# Patient Record
Sex: Male | Born: 1935 | Race: White | Hispanic: No | Marital: Married | Smoking: Former smoker
Health system: Southern US, Community
[De-identification: ages and names within clinical notes are randomized; demographics above are authoritative.]

## PROBLEM LIST (undated history)

## (undated) DIAGNOSIS — Q211 Atrial septal defect, unspecified: Secondary | ICD-10-CM

## (undated) DIAGNOSIS — R1310 Dysphagia, unspecified: Secondary | ICD-10-CM

## (undated) DIAGNOSIS — H9190 Unspecified hearing loss, unspecified ear: Secondary | ICD-10-CM

## (undated) DIAGNOSIS — F329 Major depressive disorder, single episode, unspecified: Secondary | ICD-10-CM

## (undated) DIAGNOSIS — J449 Chronic obstructive pulmonary disease, unspecified: Secondary | ICD-10-CM

## (undated) DIAGNOSIS — Z8719 Personal history of other diseases of the digestive system: Secondary | ICD-10-CM

## (undated) DIAGNOSIS — I4821 Permanent atrial fibrillation: Secondary | ICD-10-CM

## (undated) DIAGNOSIS — I2510 Atherosclerotic heart disease of native coronary artery without angina pectoris: Secondary | ICD-10-CM

## (undated) DIAGNOSIS — I519 Heart disease, unspecified: Secondary | ICD-10-CM

## (undated) DIAGNOSIS — IMO0001 Reserved for inherently not codable concepts without codable children: Secondary | ICD-10-CM

## (undated) DIAGNOSIS — K648 Other hemorrhoids: Secondary | ICD-10-CM

## (undated) DIAGNOSIS — H547 Unspecified visual loss: Secondary | ICD-10-CM

## (undated) DIAGNOSIS — K449 Diaphragmatic hernia without obstruction or gangrene: Secondary | ICD-10-CM

## (undated) DIAGNOSIS — N400 Enlarged prostate without lower urinary tract symptoms: Secondary | ICD-10-CM

## (undated) DIAGNOSIS — I639 Cerebral infarction, unspecified: Secondary | ICD-10-CM

## (undated) DIAGNOSIS — J42 Unspecified chronic bronchitis: Secondary | ICD-10-CM

## (undated) DIAGNOSIS — F32A Depression, unspecified: Secondary | ICD-10-CM

## (undated) DIAGNOSIS — K317 Polyp of stomach and duodenum: Secondary | ICD-10-CM

## (undated) DIAGNOSIS — D649 Anemia, unspecified: Secondary | ICD-10-CM

## (undated) DIAGNOSIS — K219 Gastro-esophageal reflux disease without esophagitis: Secondary | ICD-10-CM

## (undated) DIAGNOSIS — I10 Essential (primary) hypertension: Secondary | ICD-10-CM

## (undated) DIAGNOSIS — E785 Hyperlipidemia, unspecified: Secondary | ICD-10-CM

## (undated) DIAGNOSIS — K2960 Other gastritis without bleeding: Secondary | ICD-10-CM

## (undated) DIAGNOSIS — R296 Repeated falls: Secondary | ICD-10-CM

## (undated) DIAGNOSIS — K529 Noninfective gastroenteritis and colitis, unspecified: Secondary | ICD-10-CM

## (undated) HISTORY — DX: Polyp of stomach and duodenum: K31.7

## (undated) HISTORY — PX: CHOLECYSTECTOMY: SHX55

## (undated) HISTORY — DX: Diaphragmatic hernia without obstruction or gangrene: K44.9

## (undated) HISTORY — DX: Essential (primary) hypertension: I10

## (undated) HISTORY — PX: CARDIOVERSION: SHX1299

## (undated) HISTORY — DX: Cerebral infarction, unspecified: I63.9

## (undated) HISTORY — DX: Atherosclerotic heart disease of native coronary artery without angina pectoris: I25.10

## (undated) HISTORY — DX: Other gastritis without bleeding: K29.60

## (undated) HISTORY — DX: Gastro-esophageal reflux disease without esophagitis: K21.9

## (undated) HISTORY — DX: Other hemorrhoids: K64.8

## (undated) HISTORY — DX: Personal history of other diseases of the digestive system: Z87.19

## (undated) HISTORY — DX: Permanent atrial fibrillation: I48.21

## (undated) HISTORY — DX: Dysphagia, unspecified: R13.10

---

## 1961-06-03 HISTORY — PX: INGUINAL HERNIA REPAIR: SUR1180

## 1981-06-03 HISTORY — PX: ASD REPAIR: SHX258

## 1988-06-03 DIAGNOSIS — I639 Cerebral infarction, unspecified: Secondary | ICD-10-CM

## 1988-06-03 HISTORY — DX: Cerebral infarction, unspecified: I63.9

## 1992-09-22 DIAGNOSIS — K259 Gastric ulcer, unspecified as acute or chronic, without hemorrhage or perforation: Secondary | ICD-10-CM | POA: Insufficient documentation

## 1992-09-25 DIAGNOSIS — D126 Benign neoplasm of colon, unspecified: Secondary | ICD-10-CM

## 1999-01-23 ENCOUNTER — Encounter: Payer: Self-pay | Admitting: General Surgery

## 1999-01-26 ENCOUNTER — Observation Stay (HOSPITAL_COMMUNITY): Admission: RE | Admit: 1999-01-26 | Discharge: 1999-01-27 | Payer: Self-pay | Admitting: General Surgery

## 2000-01-01 ENCOUNTER — Other Ambulatory Visit: Admission: RE | Admit: 2000-01-01 | Discharge: 2000-01-01 | Payer: Self-pay | Admitting: Urology

## 2000-01-01 ENCOUNTER — Encounter (INDEPENDENT_AMBULATORY_CARE_PROVIDER_SITE_OTHER): Payer: Self-pay

## 2000-01-02 ENCOUNTER — Emergency Department (HOSPITAL_COMMUNITY): Admission: EM | Admit: 2000-01-02 | Discharge: 2000-01-02 | Payer: Self-pay | Admitting: Emergency Medicine

## 2000-05-12 ENCOUNTER — Inpatient Hospital Stay (HOSPITAL_COMMUNITY): Admission: EM | Admit: 2000-05-12 | Discharge: 2000-05-12 | Payer: Self-pay | Admitting: Cardiology

## 2001-12-03 ENCOUNTER — Encounter: Payer: Self-pay | Admitting: Internal Medicine

## 2001-12-03 DIAGNOSIS — K449 Diaphragmatic hernia without obstruction or gangrene: Secondary | ICD-10-CM

## 2001-12-03 DIAGNOSIS — D131 Benign neoplasm of stomach: Secondary | ICD-10-CM

## 2002-04-22 ENCOUNTER — Other Ambulatory Visit: Admission: RE | Admit: 2002-04-22 | Discharge: 2002-04-22 | Payer: Self-pay | Admitting: Dermatology

## 2002-05-24 ENCOUNTER — Encounter: Payer: Self-pay | Admitting: Otolaryngology

## 2002-05-24 ENCOUNTER — Ambulatory Visit (HOSPITAL_COMMUNITY): Admission: RE | Admit: 2002-05-24 | Discharge: 2002-05-24 | Payer: Self-pay | Admitting: Otolaryngology

## 2002-11-22 ENCOUNTER — Encounter: Payer: Self-pay | Admitting: Family Medicine

## 2002-11-22 ENCOUNTER — Ambulatory Visit (HOSPITAL_COMMUNITY): Admission: RE | Admit: 2002-11-22 | Discharge: 2002-11-22 | Payer: Self-pay | Admitting: Family Medicine

## 2003-02-28 ENCOUNTER — Encounter: Admission: RE | Admit: 2003-02-28 | Discharge: 2003-05-29 | Payer: Self-pay | Admitting: Family Medicine

## 2003-06-04 HISTORY — PX: OTHER SURGICAL HISTORY: SHX169

## 2003-06-10 ENCOUNTER — Encounter: Payer: Self-pay | Admitting: Internal Medicine

## 2003-06-10 DIAGNOSIS — K648 Other hemorrhoids: Secondary | ICD-10-CM | POA: Insufficient documentation

## 2004-06-19 ENCOUNTER — Ambulatory Visit: Payer: Self-pay | Admitting: Cardiology

## 2004-07-02 ENCOUNTER — Ambulatory Visit: Payer: Self-pay

## 2004-07-02 ENCOUNTER — Ambulatory Visit: Payer: Self-pay | Admitting: Internal Medicine

## 2005-07-03 ENCOUNTER — Ambulatory Visit: Payer: Self-pay | Admitting: Cardiology

## 2007-01-19 ENCOUNTER — Ambulatory Visit: Payer: Self-pay | Admitting: Internal Medicine

## 2007-01-19 LAB — CONVERTED CEMR LAB
Eosinophils Absolute: 0.2 10*3/uL (ref 0.0–0.6)
Eosinophils Relative: 3.7 % (ref 0.0–5.0)
Hemoglobin: 13 g/dL (ref 13.0–17.0)
Lymphocytes Relative: 23.4 % (ref 12.0–46.0)
MCV: 101.1 fL — ABNORMAL HIGH (ref 78.0–100.0)
Monocytes Absolute: 0.4 10*3/uL (ref 0.2–0.7)
Monocytes Relative: 10 % (ref 3.0–11.0)
Neutro Abs: 2.7 10*3/uL (ref 1.4–7.7)
Platelets: 170 10*3/uL (ref 150–400)

## 2007-06-26 ENCOUNTER — Ambulatory Visit: Payer: Self-pay | Admitting: Cardiovascular Disease

## 2007-06-26 ENCOUNTER — Inpatient Hospital Stay (HOSPITAL_COMMUNITY): Admission: EM | Admit: 2007-06-26 | Discharge: 2007-06-30 | Payer: Self-pay | Admitting: Emergency Medicine

## 2007-06-29 ENCOUNTER — Encounter: Payer: Self-pay | Admitting: Cardiovascular Disease

## 2007-07-21 ENCOUNTER — Ambulatory Visit: Payer: Self-pay | Admitting: Cardiology

## 2007-09-05 DIAGNOSIS — I10 Essential (primary) hypertension: Secondary | ICD-10-CM

## 2007-09-05 DIAGNOSIS — K209 Esophagitis, unspecified without bleeding: Secondary | ICD-10-CM | POA: Insufficient documentation

## 2007-09-05 DIAGNOSIS — K219 Gastro-esophageal reflux disease without esophagitis: Secondary | ICD-10-CM

## 2007-09-05 DIAGNOSIS — R1310 Dysphagia, unspecified: Secondary | ICD-10-CM | POA: Insufficient documentation

## 2007-09-05 DIAGNOSIS — I4891 Unspecified atrial fibrillation: Secondary | ICD-10-CM | POA: Insufficient documentation

## 2007-09-05 DIAGNOSIS — K922 Gastrointestinal hemorrhage, unspecified: Secondary | ICD-10-CM | POA: Insufficient documentation

## 2007-09-05 DIAGNOSIS — I2510 Atherosclerotic heart disease of native coronary artery without angina pectoris: Secondary | ICD-10-CM

## 2007-09-05 DIAGNOSIS — K2960 Other gastritis without bleeding: Secondary | ICD-10-CM

## 2007-09-05 DIAGNOSIS — I6350 Cerebral infarction due to unspecified occlusion or stenosis of unspecified cerebral artery: Secondary | ICD-10-CM | POA: Insufficient documentation

## 2008-01-27 ENCOUNTER — Ambulatory Visit: Payer: Self-pay | Admitting: Cardiology

## 2008-11-10 ENCOUNTER — Telehealth (INDEPENDENT_AMBULATORY_CARE_PROVIDER_SITE_OTHER): Payer: Self-pay | Admitting: *Deleted

## 2008-12-19 ENCOUNTER — Ambulatory Visit: Payer: Self-pay | Admitting: Cardiology

## 2008-12-26 ENCOUNTER — Encounter: Payer: Self-pay | Admitting: Family Medicine

## 2008-12-26 ENCOUNTER — Ambulatory Visit (HOSPITAL_COMMUNITY): Admission: RE | Admit: 2008-12-26 | Discharge: 2008-12-26 | Payer: Self-pay | Admitting: Family Medicine

## 2008-12-26 ENCOUNTER — Ambulatory Visit: Payer: Self-pay | Admitting: Vascular Surgery

## 2009-01-01 ENCOUNTER — Emergency Department (HOSPITAL_COMMUNITY): Admission: EM | Admit: 2009-01-01 | Discharge: 2009-01-01 | Payer: Self-pay | Admitting: Emergency Medicine

## 2009-01-01 ENCOUNTER — Encounter (INDEPENDENT_AMBULATORY_CARE_PROVIDER_SITE_OTHER): Payer: Self-pay | Admitting: Emergency Medicine

## 2009-03-14 ENCOUNTER — Ambulatory Visit: Payer: Self-pay

## 2009-03-14 ENCOUNTER — Ambulatory Visit: Payer: Self-pay | Admitting: Cardiology

## 2009-03-14 ENCOUNTER — Ambulatory Visit (HOSPITAL_COMMUNITY): Admission: RE | Admit: 2009-03-14 | Discharge: 2009-03-14 | Payer: Self-pay | Admitting: Cardiology

## 2009-03-20 ENCOUNTER — Ambulatory Visit: Payer: Self-pay | Admitting: Cardiology

## 2009-03-20 ENCOUNTER — Telehealth (INDEPENDENT_AMBULATORY_CARE_PROVIDER_SITE_OTHER): Payer: Self-pay | Admitting: *Deleted

## 2009-03-21 ENCOUNTER — Encounter (INDEPENDENT_AMBULATORY_CARE_PROVIDER_SITE_OTHER): Payer: Self-pay | Admitting: *Deleted

## 2009-03-22 ENCOUNTER — Ambulatory Visit: Payer: Self-pay | Admitting: Internal Medicine

## 2009-03-23 LAB — CONVERTED CEMR LAB
BUN: 10 mg/dL (ref 6–23)
Calcium: 8.5 mg/dL (ref 8.4–10.5)
GFR calc non Af Amer: 87.49 mL/min (ref >60)
Glucose, Bld: 102 mg/dL — ABNORMAL HIGH (ref 70–99)
Potassium: 3.9 meq/L (ref 3.5–5.1)

## 2009-03-28 ENCOUNTER — Ambulatory Visit: Payer: Self-pay | Admitting: Cardiology

## 2009-04-06 ENCOUNTER — Ambulatory Visit: Payer: Self-pay | Admitting: Cardiology

## 2009-04-06 ENCOUNTER — Encounter: Payer: Self-pay | Admitting: Cardiology

## 2009-07-20 ENCOUNTER — Ambulatory Visit: Payer: Self-pay | Admitting: Family Medicine

## 2009-07-20 DIAGNOSIS — E785 Hyperlipidemia, unspecified: Secondary | ICD-10-CM | POA: Insufficient documentation

## 2009-07-20 DIAGNOSIS — G609 Hereditary and idiopathic neuropathy, unspecified: Secondary | ICD-10-CM

## 2009-07-20 DIAGNOSIS — N400 Enlarged prostate without lower urinary tract symptoms: Secondary | ICD-10-CM | POA: Insufficient documentation

## 2009-07-20 LAB — CONVERTED CEMR LAB: Cholesterol, target level: 200 mg/dL

## 2009-07-26 ENCOUNTER — Ambulatory Visit: Payer: Self-pay | Admitting: Family Medicine

## 2009-07-26 DIAGNOSIS — E538 Deficiency of other specified B group vitamins: Secondary | ICD-10-CM | POA: Insufficient documentation

## 2009-07-26 DIAGNOSIS — R739 Hyperglycemia, unspecified: Secondary | ICD-10-CM

## 2009-07-26 LAB — CONVERTED CEMR LAB
AST: 31 units/L (ref 0–37)
Albumin: 4 g/dL (ref 3.5–5.2)
Alkaline Phosphatase: 46 units/L (ref 39–117)
BUN: 10 mg/dL (ref 6–23)
Bilirubin, Direct: 0.2 mg/dL (ref 0.0–0.3)
CO2: 32 meq/L (ref 19–32)
Calcium: 8.9 mg/dL (ref 8.4–10.5)
Chloride: 108 meq/L (ref 96–112)
Cholesterol: 124 mg/dL (ref 0–200)
Creatinine, Ser: 0.9 mg/dL (ref 0.4–1.5)
Direct LDL: 54.4 mg/dL
Glucose, Bld: 169 mg/dL — ABNORMAL HIGH (ref 70–99)
Total Bilirubin: 0.6 mg/dL (ref 0.3–1.2)
Total CHOL/HDL Ratio: 3
VLDL: 44.2 mg/dL — ABNORMAL HIGH (ref 0.0–40.0)
Vitamin B-12: 125 pg/mL — ABNORMAL LOW (ref 211–911)

## 2009-08-08 ENCOUNTER — Ambulatory Visit: Payer: Self-pay | Admitting: Family Medicine

## 2009-08-08 LAB — CONVERTED CEMR LAB: Blood Glucose, Fingerstick: 89

## 2009-08-22 ENCOUNTER — Ambulatory Visit: Payer: Self-pay | Admitting: Family Medicine

## 2009-09-06 ENCOUNTER — Telehealth: Payer: Self-pay | Admitting: Family Medicine

## 2009-09-06 ENCOUNTER — Ambulatory Visit: Payer: Self-pay | Admitting: Family Medicine

## 2009-09-19 ENCOUNTER — Ambulatory Visit: Payer: Self-pay | Admitting: Family Medicine

## 2009-10-03 ENCOUNTER — Ambulatory Visit: Payer: Self-pay | Admitting: Family Medicine

## 2009-10-11 ENCOUNTER — Telehealth: Payer: Self-pay | Admitting: *Deleted

## 2009-10-17 ENCOUNTER — Ambulatory Visit: Payer: Self-pay | Admitting: Family Medicine

## 2009-11-01 ENCOUNTER — Ambulatory Visit: Payer: Self-pay | Admitting: Family Medicine

## 2009-11-09 ENCOUNTER — Ambulatory Visit: Payer: Self-pay | Admitting: Family Medicine

## 2009-11-09 DIAGNOSIS — R5381 Other malaise: Secondary | ICD-10-CM

## 2009-11-09 DIAGNOSIS — R5383 Other fatigue: Secondary | ICD-10-CM

## 2009-11-10 LAB — CONVERTED CEMR LAB
Basophils Relative: 0.5 % (ref 0.0–3.0)
Eosinophils Absolute: 0.1 10*3/uL (ref 0.0–0.7)
Hemoglobin: 13.7 g/dL (ref 13.0–17.0)
Lymphocytes Relative: 19.4 % (ref 12.0–46.0)
MCHC: 34.1 g/dL (ref 30.0–36.0)
Monocytes Relative: 10.2 % (ref 3.0–12.0)
Neutro Abs: 3.1 10*3/uL (ref 1.4–7.7)
Neutrophils Relative %: 68.4 % (ref 43.0–77.0)
RBC: 4.39 M/uL (ref 4.22–5.81)
Vitamin B-12: 480 pg/mL (ref 211–911)
WBC: 4.5 10*3/uL (ref 4.5–10.5)

## 2010-03-19 ENCOUNTER — Ambulatory Visit: Payer: Self-pay | Admitting: Cardiology

## 2010-03-19 DIAGNOSIS — R143 Flatulence: Secondary | ICD-10-CM

## 2010-03-19 DIAGNOSIS — R0602 Shortness of breath: Secondary | ICD-10-CM | POA: Insufficient documentation

## 2010-03-19 DIAGNOSIS — R141 Gas pain: Secondary | ICD-10-CM

## 2010-03-19 DIAGNOSIS — R142 Eructation: Secondary | ICD-10-CM

## 2010-04-03 ENCOUNTER — Ambulatory Visit (HOSPITAL_COMMUNITY)
Admission: RE | Admit: 2010-04-03 | Discharge: 2010-04-03 | Payer: Self-pay | Source: Home / Self Care | Admitting: Cardiology

## 2010-04-06 ENCOUNTER — Telehealth: Payer: Self-pay | Admitting: Cardiology

## 2010-04-25 ENCOUNTER — Encounter: Payer: Self-pay | Admitting: Family Medicine

## 2010-07-01 LAB — CONVERTED CEMR LAB
Basophils Relative: 0.5 % (ref 0.0–3.0)
Ferritin: 34.7 ng/mL (ref 22.0–322.0)
HCT: 41.3 % (ref 39.0–52.0)
Hemoglobin: 14 g/dL (ref 13.0–17.0)
Lymphocytes Relative: 21 % (ref 12.0–46.0)
Lymphs Abs: 1 10*3/uL (ref 0.7–4.0)
MCHC: 33.8 g/dL (ref 30.0–36.0)
Monocytes Relative: 9.7 % (ref 3.0–12.0)
Neutro Abs: 3.3 10*3/uL (ref 1.4–7.7)
RBC: 4.48 M/uL (ref 4.22–5.81)
Saturation Ratios: 22.7 % (ref 20.0–50.0)

## 2010-07-05 NOTE — Progress Notes (Signed)
Summary: pt returned call   Phone Note Call from Patient   Caller: Patient [phone] Reason for Call: Talk to Nurse Summary of Call: pt returned call Initial call taken by: Glynda Jaeger,  April 06, 2010 1:50 PM  Follow-up for Phone Call        I spoke with the pt. Follow-up by: Sherri Rad, RN, BSN,  April 06, 2010 2:32 PM

## 2010-07-05 NOTE — Assessment & Plan Note (Signed)
Summary: b12 inj also fasting blood glucose/njr   Nurse Visit  CBG Result 89   Impression & Recommendations:  Problem # 1:  HYPERGLYCEMIA (ICD-790.29) fasting glucose normal.  Complete Medication List: 1)  Omeprazole 20 Mg Cpdr (Omeprazole) .... 2 caps daily 2)  Enalapril Maleate 10 Mg Tabs (Enalapril maleate) .... Take one tablet by mouth twice a day 3)  Hydrochlorothiazide 25 Mg Tabs (Hydrochlorothiazide) .... Once daily 4)  Warfarin Sodium 5 Mg Tabs (Warfarin sodium) .... Take as directed 5)  Doxazosin Mesylate 4 Mg Tabs (Doxazosin mesylate) .... Once daily 6)  Zocor 20 Mg Tabs (Simvastatin) .... Once daily 7)  Norvasc 5 Mg Tabs (Amlodipine besylate) .[name].. 1 once daily 8)  Gabapentin 300 Mg Caps (Gabapentin) .... Take one tab two times a day 9)  Tandem Plus 162-115.2-1 Mg Caps (Fefum-fepo-fa-b cmp-c-zn-mn-cu) .[name].. 1 tab once daily  Other Orders: Capillary Blood Glucose/CBG (16109) Vit B12 1000 mcg (J3420) Admin of Therapeutic Inj  intramuscular or subcutaneous (60454)   Allergies: 1)  ! Pcn Laboratory Results   Blood Tests     CBG Random:: 89mg /dL     Medication Administration  Injection # 1:    Medication: Vit B12 1000 mcg    Diagnosis: B12 DEFICIENCY (ICD-266.2)    Route: IM    Site: R deltoid    Exp Date: 02/02/2011    Lot #: [id number]    Mfr: American Regent    Patient tolerated injection without complications    Given by: Sid Falcon LPN (August 08, 1912 8:48 AM)  Orders Added: 1)  Capillary Blood Glucose/CBG [82948] 2)  Vit B12 1000 mcg [J3420] 3)  Admin of Therapeutic Inj  intramuscular or subcutaneous [78295]

## 2010-07-05 NOTE — Assessment & Plan Note (Signed)
Summary: b12//ccm   Nurse Visit   Allergies: 1)  ! Pcn  Medication Administration  Injection # 1:    Medication: Vit B12 1000 mcg    Diagnosis: B12 DEFICIENCY (ICD-266.2)    Route: IM    Site: R deltoid    Exp Date: 02/02/2011    Lot #: [id number]    Mfr: American Regent    Patient tolerated injection without complications    Given by: Sid Falcon LPN (September 06, 4164 12:21 PM)  Orders Added: 1)  Vit B12 1000 mcg [J3420] 2)  Admin of Therapeutic Inj  intramuscular or subcutaneous [06301]

## 2010-07-05 NOTE — Assessment & Plan Note (Signed)
Summary: b12 inj/njr   Nurse Visit   Allergies: 1)  ! Pcn  Medication Administration  Injection # 1:    Medication: Vit B12 1000 mcg    Diagnosis: B12 DEFICIENCY (ICD-266.2)    Route: IM    Site: L deltoid    Exp Date: 02/02/2011    Lot #: [id number]    Mfr: American Regent    Patient tolerated injection without complications    Given by: Sid Falcon LPN (August 22, 2009 12:44 PM)  Orders Added: 1)  Vit B12 1000 mcg [J3420] 2)  Admin of Therapeutic Inj  intramuscular or subcutaneous [25366]

## 2010-07-05 NOTE — Assessment & Plan Note (Signed)
Summary: fup labs//ccm/wife rescd per pt/ccm   Vital Signs:  Patient profile:   77 year old male Temp:     97.8 degrees F oral BP sitting:   140 / 70  (left arm) Cuff size:   regular  Vitals Entered By: Sid Falcon LPN (July 26, 2009 1:51 PM) CC: follow-up visit to discuss labs   History of Present Illness: Patient here for followup regarding recent abnormal labs.  Lab work significant for low B12 of 125. Patient had complained of some short-term memory loss and also fatigue issues and bilateral numbness in feet and lower legs. No history of diabetes but nonfasting blood sugar was 169. Takes multivitamin which presumably has some B12. No known history of B12 deficiency.  Increased fatigue but denies any thirst or weight changes.  Has some chronic urine frequency.  No hx of DM.  Chronic Coumadin therapy related to atrial fibrillation. Recent INR 1.5. History of upper GI bleeds and apparently his INRs been maintained around 1.5-2.0. Followed in Coumadin clinic in [facility]. No recent evidence for upper GI bleed. Compliant with Coumadin therapy.  Allergies: 1)  ! Pcn  Past History:  Past Medical History: Last updated: 07/20/2009 Current Problems:  Hx of UPPER GASTROINTESTINAL HEMORRHAGE (ICD-578.9) CEREBROVASCULAR ACCIDENT (ICD-434.91) GASTRIC POLYP (ICD-211.1) DYSPHAGIA UNSPECIFIED (ICD-787.20) CORONARY ARTERY DISEASE (ICD-414.00) FIBRILLATION, ATRIAL (ICD-427.31) HYPERTENSION (ICD-401.9) ESOPHAGITIS (ICD-530.10) GERD (ICD-530.81) EROSIVE GASTRITIS (ICD-535.40) COLONIC POLYPS (ICD-211.3) GASTRIC ULCER (ICD-531.90) HIATAL HERNIA (ICD-553.3) INTERNAL HEMORRHOIDS (ICD-455.0) 1. Atrial fibrillation  2. mild nonobstructive coronary     disease at catheterization 2009 3. Chronic gastrointestinal blood loss probably related either     esophagitis or peptic ulcer disease necessitating lower dose of     Coumadin therapy. 4. Hypertension.  BPH  Past  Surgical History: Last updated: 07/20/2009 Cholecystectomy Atrioseptal defect repair 1983 Inguinal hernia 1963 Partial small bowel resection 2005 ?ischemic  Social History: Last updated: 07/20/2009 Retired Married Former Smoker Alcohol use-no PMH-FH-SH reviewed for relevance  Review of Systems      See HPI  Physical Exam  General:  Well-developed,well-nourished,in no acute distress; alert,appropriate and cooperative throughout examination Mouth:  Oral mucosa and oropharynx without lesions or exudates.  Teeth in good repair. Neck:  No deformities, masses, or tenderness noted. Lungs:  Normal respiratory effort, chest expands symmetrically. Lungs are clear to auscultation, no crackles or wheezes. Heart:  irregularly irregular rhythm rate controlled Extremities:  very minimal edema of lower legs bilaterally Neurologic:  chronic weakness left face from chronic Bell's palsyRLE sensory loss and LLE sensory loss.   Skin:  Intact without suspicious lesions or rashes Cervical Nodes:  No lymphadenopathy noted Psych:  Oriented X3, normally interactive, good eye contact, not anxious appearing, and not depressed appearing.     Impression & Recommendations:  Problem # 1:  B12 DEFICIENCY (ICD-266.2) Assessment New discussed options.  Start B12 IM q other week for 8 weeks then monthly and will recheck level in about 3 months. Orders: Vit B12 1000 mcg (J3420) Admin of Therapeutic Inj  intramuscular or subcutaneous (60454)  Problem # 2:  PERIPHERAL NEUROPATHY (ICD-356.9) ?related to B12 defic. TSH normal.  Problem # 3:  FIBRILLATION, ATRIAL (ICD-427.31) rated controlled. His updated medication list for this problem includes:    Warfarin Sodium 5 Mg Tabs (Warfarin sodium) .[name]... Take as directed    Norvasc 5 Mg Tabs (Amlodipine besylate) .[name]... 1 once daily  Problem # 4:  HYPERGLYCEMIA (ICD-790.29) Assessment: New need to rule out diabetes. Obtain fasting glucose at  followup.  Dietary issues discussed.  Complete Medication List: 1)  Omeprazole 20 Mg Cpdr (Omeprazole) .... 2 caps daily 2)  Enalapril Maleate 10 Mg Tabs (Enalapril maleate) .... Take one tablet by mouth twice a day 3)  Hydrochlorothiazide 25 Mg Tabs (Hydrochlorothiazide) .... Once daily 4)  Warfarin Sodium 5 Mg Tabs (Warfarin sodium) .... Take as directed 5)  Doxazosin Mesylate 4 Mg Tabs (Doxazosin mesylate) .... Once daily 6)  Zocor 20 Mg Tabs (Simvastatin) .... Once daily 7)  Norvasc 5 Mg Tabs (Amlodipine besylate) .[name].. 1 once daily 8)  Gabapentin 300 Mg Caps (Gabapentin) .... Take one tab two times a day 9)  Tandem Plus 162-115.2-1 Mg Caps (Fefum-fepo-fa-b cmp-c-zn-mn-cu) .[name].. 1 tab once daily  Patient Instructions: 1)  Schedule nursing followup in 2 weeks for B12 injection and obtain fasting blood glucose then 2)  Please schedule a follow-up appointment in 3 months .  3)  You will need B12 injection every other week for 2 months then monthly. 4)  We will plan to get follow up B12 level in about 3 months.   Immunization History:  Influenza Immunization History:    Influenza:  historical (03/03/2009)   Medication Administration  Injection # 1:    Medication: Vit B12 1000 mcg    Diagnosis: B12 DEFICIENCY (ICD-266.2)    Route: IM    Site: L deltoid    Exp Date: 02/02/2011    Lot #: [id number]    Mfr: American Regent    Patient tolerated injection without complications    Given by: Sid Falcon LPN (July 26, 2009 2:54 PM)  Orders Added: 1)  Vit B12 1000 mcg [J3420] 2)  Admin of Therapeutic Inj  intramuscular or subcutaneous [96372] 3)  Est. Patient Level IV [14782]

## 2010-07-05 NOTE — Miscellaneous (Signed)
Summary: flu vaccine   Clinical Lists Changes  Observations: Added new observation of FLU VAX: Historical (03/23/2010 15:19)      Immunization History:  Influenza Immunization History:    Influenza:  Historical (03/23/2010) given at [facility]

## 2010-07-05 NOTE — Progress Notes (Signed)
Summary: REQ FOR REFILL (Doxazosin Mesy 4mg )  Phone Note Refill Request Message from:  Fax from Pharmacy on Oct 11, 2009 2:16 PM  Refills Requested: Medication #1:  DOXAZOSIN MESYLATE 4 MG TABS once daily   Notes: [facility] [place], [place]    Initial call taken by: Debbra Riding,  Oct 11, 2009 2:17 PM  Follow-up for Phone Call        This was done earlier today electronically Follow-up by: Sid Falcon LPN,  Oct 11, 2009 5:20 PM

## 2010-07-05 NOTE — Assessment & Plan Note (Signed)
Summary: b12 inj/njr   Nurse Visit   Allergies: 1)  ! Pcn  Medication Administration  Injection # 1:    Medication: Vit B12 1000 mcg    Diagnosis: B12 DEFICIENCY (ICD-266.2)    Route: IM    Site: L deltoid    Exp Date: 02/02/2011    Lot #: [id number]    Mfr: American Regent    Patient tolerated injection without complications    Given by: Sid Falcon LPN (September 19, 2009 12:32 PM)  Orders Added: 1)  Vit B12 1000 mcg [J3420] 2)  Admin of Therapeutic Inj  intramuscular or subcutaneous [96045]

## 2010-07-05 NOTE — Assessment & Plan Note (Signed)
Summary: b12 inj/njr   Nurse Visit   Allergies: 1)  ! Pcn  Medication Administration  Injection # 1:    Medication: Vit B12 1000 mcg    Diagnosis: B12 DEFICIENCY (ICD-266.2)    Route: IM    Site: L deltoid    Exp Date: 02/02/2011    Lot #: [id number]    Mfr: American Regent    Patient tolerated injection without complications    Given by: Sid Falcon LPN (Oct 04, 9526 12:06 PM)  Orders Added: 1)  Vit B12 1000 mcg [J3420] 2)  Admin of Therapeutic Inj  intramuscular or subcutaneous [41324]

## 2010-07-05 NOTE — Progress Notes (Signed)
Summary: Lotrisone cream refill X 1 year  Phone Note From Pharmacy   Caller: [facility] [phone]* Call For: [name]  Summary of Call: Pt pharmacy faxed refill request for Lotrisone cream.  No on pt med list. OK per Dr Caryl Never to add to med list and fill X 1 year Initial call taken by: Sid Falcon LPN,  September 06, 2009 12:29 PM    New/Updated Medications: LOTRISONE 1-0.05 % CREA (CLOTRIMAZOLE-BETAMETHASONE) apply to affected area twice daily as needed Prescriptions: LOTRISONE 1-0.05 % CREA (CLOTRIMAZOLE-BETAMETHASONE) apply to affected area twice daily as needed  #45 x 3   Entered by:   Sid Falcon LPN   Authorized by:   Evelena Peat MD   Signed by:   Sid Falcon LPN on 41/66/0630   Method used:   Electronically to        [facility] [phone]* (retail)       [address] [place], [place]  [address]       Ph: [phone] or [phone]       Fax: [phone]   RxID:   [id number]

## 2010-07-05 NOTE — Assessment & Plan Note (Signed)
Summary: b12 inj/njr   Nurse Visit   Allergies: 1)  ! Pcn  Medication Administration  Injection # 1:    Medication: Vit B12 1000 mcg    Diagnosis: B12 DEFICIENCY (ICD-266.2)    Route: IM    Site: R deltoid    Exp Date: 02/02/2011    Lot #: [id number]    Mfr: American Regent    Patient tolerated injection without complications    Given by: Sid Falcon LPN (Oct 17, 2009 12:20 PM)  Orders Added: 1)  Vit B12 1000 mcg [J3420] 2)  Admin of Therapeutic Inj  intramuscular or subcutaneous [96045]

## 2010-07-05 NOTE — Assessment & Plan Note (Signed)
Summary: chest pains      Allergies Added:   Visit Type:  Follow-up Primary Provider:  Evelena Peat MD  CC:  weakness-chest pain- sob.  History of Present Illness: [name] is 77 years old and return for management of atrial fibrillation and bradycardia. He has a long history of chronic atrial fibrillation. He is also status post ASD repair. He has had recent bradycardia and we evaluated him with a event monitor and a stress test and he had no severe slowing and had a reasonable heart rate response on the stress test. We had previously discontinued the Cardizem.  He also has nonobstructive coronary disease.  He's had no recent symptoms of chest pain unusual shortness of breath or palpitations. He does have increased fatigue which he suspects is probably related to aging.  His other problems include hypertension and chronic GI blood loss. He had a hemoglobin about 3 months ago of 13.4  Current Medications (verified): 1)  Omeprazole 20 Mg Cpdr (Omeprazole) .... 2 Caps Daily 2)  Enalapril Maleate 10 Mg Tabs (Enalapril Maleate) .... Take One Tablet By Mouth Twice A Day 3)  Hydrochlorothiazide 25 Mg Tabs (Hydrochlorothiazide) .... Once Daily 4)  Warfarin Sodium 5 Mg Tabs (Warfarin Sodium) .... Take As Directed 5)  Doxazosin Mesylate 4 Mg Tabs (Doxazosin Mesylate) .... Once Daily 6)  Zocor 20 Mg Tabs (Simvastatin) .... Once Daily 7)  Norvasc 5 Mg Tabs (Amlodipine Besylate) .[name].. 1 Once Daily 8)  Gabapentin 300 Mg Caps (Gabapentin) .... Take One Tab Two Times A Day 9)  Tandem Plus 162-115.2-1 Mg Caps (Fefum-Fepo-Fa-B Cmp-C-Zn-Mn-Cu) .[name].. 1 Tab Once Daily 10)  Lotrisone 1-0.05 % Crea (Clotrimazole-Betamethasone) .... Apply To Affected Area Twice Daily As Needed  Allergies (verified): 1)  ! Pcn  Past History:  Past Medical History: Reviewed history from 07/20/2009 and no changes required. Current Problems:  Hx of UPPER GASTROINTESTINAL HEMORRHAGE (ICD-578.9) CEREBROVASCULAR ACCIDENT  (ICD-434.91) GASTRIC POLYP (ICD-211.1) DYSPHAGIA UNSPECIFIED (ICD-787.20) CORONARY ARTERY DISEASE (ICD-414.00) FIBRILLATION, ATRIAL (ICD-427.31) HYPERTENSION (ICD-401.9) ESOPHAGITIS (ICD-530.10) GERD (ICD-530.81) EROSIVE GASTRITIS (ICD-535.40) COLONIC POLYPS (ICD-211.3) GASTRIC ULCER (ICD-531.90) HIATAL HERNIA (ICD-553.3) INTERNAL HEMORRHOIDS (ICD-455.0) 1. Atrial fibrillation  2. mild nonobstructive coronary     disease at catheterization 2009 3. Chronic gastrointestinal blood loss probably related either     esophagitis or peptic ulcer disease necessitating lower dose of     Coumadin therapy. 4. Hypertension.  BPH  Review of Systems       ROS is negative except as outlined in HPI.   Vital Signs:  Patient profile:   78 year old male Height:      67 inches Weight:      182 pounds BMI:     28.61 Pulse rate:   62 / minute BP sitting:   137 / 76  (left arm) Cuff size:   regular  Vitals Entered By: Burnett Kanaris, CNA (March 19, 2010 11:47 AM)  Physical Exam  Additional Exam:  Gen. Well-nourished, in no distress   Neck: No JVD, thyroid not enlarged, no carotid bruits Lungs: No tachypnea, clear without rales, rhonchi or wheezes Cardiovascular: Rhythm irregular, PMI not displaced,  heart sounds  normal, no murmurs or gallops, no peripheral edema, pulses normal in all 4 extremities. Abdomen: BS normal, abdomen soft and non-tender without masses or organomegaly, no hepatosplenomegaly,protuberant MS: No deformities, no cyanosis or clubbing   Neuro:  No focal sns   Skin:  no lesions    Impression & Recommendations:  Problem # 1:  FIBRILLATION, ATRIAL (ICD-427.31)  He has chronic atrial fibrillation which is managed with Coumadin. He is no longer on rate control medications because his heart rates have been slow. We don't feel he has had enough slowing to one and a pacemaker and are following this. His updated medication list for this problem includes:    Warfarin Sodium 5  Mg Tabs (Warfarin sodium) .[name]... Take as directed  Problem # 2:  FATIGUE (ICD-780.79) He has fatigue and some shortness of breath. We will check a repeat hemoglobin and iron and iron-binding capacity since he is known to have chronic GI blood loss. I don't think his fatigue is related to his bradycardia based on her recent studies. Orders: EKG w/ Interpretation (93000) TLB-CBC Platelet - w/Differential (85025-CBCD) TLB-IBC Pnl (Iron/FE;Transferrin) (83550-IBC) TLB-Ferritin (82728-FER) TLB-Iron, (Fe) Total (83540-FE)  Problem # 3:  ABDOMINAL DISTENSION (ICD-787.3)  He has protuberance of theabdomen. I can feel no hepatosplenomegaly. He and his family are quite concerned and we'll plan to get a KUB.   T-2 View CXR (71020TC) Radiology Referral (Radiology)  Patient Instructions: 1)  Your physician wants you to follow-up in: 1 year with Dr. Johney Frame. You will receive a reminder letter in the mail two months in advance. If you don't receive a letter, please call our office to schedule the follow-up appointment. 2)  A chest x-ray takes a picture of the organs and structures inside the chest, including the heart, lungs, and blood vessels. This test can show several things, including, whether the heart is enlarged; whether fluid is building up in the lungs; and whether pacemaker / defibrillator leads are still in place. 3)  X-Ray- KUB. 4)  Labwork today: cbc/iron/T-IBC/ Ferritin

## 2010-07-05 NOTE — Assessment & Plan Note (Signed)
Summary: fu per pt/njr   Vital Signs:  Patient profile:   77 year old male Weight:      186 pounds Temp:     97 degrees F oral BP sitting:   140 / 70  (left arm) Cuff size:   regular  Vitals Entered By: Sid Falcon LPN (November 09, 1608 8:43 AM) CC: follow-up visit, Hypertension Management   History of Present Illness: Hx low B12 . Had fatigue, memory issues, and paresthesia of legs and feet.  Unchanged with B12 therapy.  Has received several shots.  No f/u levels.  GERD symptoms stable.  Bed elevated.  No dysphagia.  Hx esophagitis.  Compliant with meds.  Hypertension meds reviewed.  Compliant with all.  Hypertension History:      He denies headache, chest pain, palpitations, dyspnea with exertion, orthopnea, peripheral edema, visual symptoms, neurologic problems, syncope, and side effects from treatment.        Positive major cardiovascular risk factors include male age 27 years old or older, hyperlipidemia, and hypertension.  Negative major cardiovascular risk factors include no history of diabetes, negative family history for ischemic heart disease, and non-tobacco-user status.        Positive history for target organ damage include ASHD (either angina/prior MI/prior CABG) and prior stroke (or TIA).  Further assessment for target organ damage reveals no history of peripheral vascular disease.     Allergies: 1)  ! Pcn  Past History:  Past Medical History: Last updated: 07/20/2009 Current Problems:  Hx of UPPER GASTROINTESTINAL HEMORRHAGE (ICD-578.9) CEREBROVASCULAR ACCIDENT (ICD-434.91) GASTRIC POLYP (ICD-211.1) DYSPHAGIA UNSPECIFIED (ICD-787.20) CORONARY ARTERY DISEASE (ICD-414.00) FIBRILLATION, ATRIAL (ICD-427.31) HYPERTENSION (ICD-401.9) ESOPHAGITIS (ICD-530.10) GERD (ICD-530.81) EROSIVE GASTRITIS (ICD-535.40) COLONIC POLYPS (ICD-211.3) GASTRIC ULCER (ICD-531.90) HIATAL HERNIA (ICD-553.3) INTERNAL HEMORRHOIDS (ICD-455.0) 1. Atrial fibrillation  2. mild  nonobstructive coronary     disease at catheterization 2009 3. Chronic gastrointestinal blood loss probably related either     esophagitis or peptic ulcer disease necessitating lower dose of     Coumadin therapy. 4. Hypertension.  BPH  Past Surgical History: Last updated: 07/20/2009 Cholecystectomy Atrioseptal defect repair 1983 Inguinal hernia 1963 Partial small bowel resection 2005 ?ischemic  Social History: Last updated: 07/20/2009 Retired Married Former Smoker Alcohol use-no PMH-FH-SH reviewed for relevance  Review of Systems  The patient denies anorexia, fever, weight loss, weight gain, chest pain, syncope, peripheral edema, prolonged cough, headaches, hemoptysis, abdominal pain, melena, hematochezia, severe indigestion/heartburn, and muscle weakness.    Physical Exam  General:  Well-developed,well-nourished,in no acute distress; alert,appropriate and cooperative throughout examination Mouth:  Oral mucosa and oropharynx without lesions or exudates.  Teeth in good repair. Neck:  No deformities, masses, or tenderness noted. Lungs:  Normal respiratory effort, chest expands symmetrically. Lungs are clear to auscultation, no crackles or wheezes. Heart:  normal rate and regular rhythm.   Abdomen:  soft and non-tender.   Extremities:  no edema.   Impression & Recommendations:  Problem # 1:  B12 DEFICIENCY (ICD-266.2)  repeat levels look at option of oral replacement if normal levels at this time. Orders: Venipuncture (96045) TLB-B12, Serum-Total ONLY (40981-X91) TLB-CBC Platelet - w/Differential (85025-CBCD)  Problem # 2:  HYPERTENSION (ICD-401.9) Assessment: Unchanged  His updated medication list for this problem includes:    Enalapril Maleate 10 Mg Tabs (Enalapril maleate) .[name]... Take one tablet by mouth twice a day    Hydrochlorothiazide 25 Mg Tabs (Hydrochlorothiazide) ..... Once daily    Doxazosin Mesylate 4 Mg Tabs (Doxazosin mesylate) ..... Once daily  Norvasc 5 Mg Tabs (Amlodipine besylate) .[name]... 1 once daily  Orders: Venipuncture (16109)  Problem # 3:  GERD (ICD-530.81) Assessment: Unchanged  His updated medication list for this problem includes:    Omeprazole 20 Mg Cpdr (Omeprazole) .[name]... 2 caps daily  Complete Medication List: 1)  Omeprazole 20 Mg Cpdr (Omeprazole) .... 2 caps daily 2)  Enalapril Maleate 10 Mg Tabs (Enalapril maleate) .... Take one tablet by mouth twice a day 3)  Hydrochlorothiazide 25 Mg Tabs (Hydrochlorothiazide) .... Once daily 4)  Warfarin Sodium 5 Mg Tabs (Warfarin sodium) .... Take as directed 5)  Doxazosin Mesylate 4 Mg Tabs (Doxazosin mesylate) .... Once daily 6)  Zocor 20 Mg Tabs (Simvastatin) .... Once daily 7)  Norvasc 5 Mg Tabs (Amlodipine besylate) .[name].. 1 once daily 8)  Gabapentin 300 Mg Caps (Gabapentin) .... Take one tab two times a day 9)  Tandem Plus 162-115.2-1 Mg Caps (Fefum-fepo-fa-b cmp-c-zn-mn-cu) .[name].. 1 tab once daily 10)  Lotrisone 1-0.05 % Crea (Clotrimazole-betamethasone) .... Apply to affected area twice daily as needed  Hypertension Assessment/Plan:      The patient's hypertensive risk group is category C: Target organ damage and/or diabetes.  Today's blood pressure is 140/70.    Patient Instructions: 1)  Please schedule a follow-up appointment in 6 months .

## 2010-07-05 NOTE — Assessment & Plan Note (Signed)
Summary: b12 inj/njr----PT [facility] // RS   Nurse Visit   Allergies: 1)  ! Pcn  Medication Administration  Injection # 1:    Medication: Vit B12 1000 mcg    Diagnosis: B12 DEFICIENCY (ICD-266.2)    Route: IM    Site: L deltoid    Exp Date: 02/02/2011    Lot #: [id number]    Mfr: American Regent    Patient tolerated injection without complications    Given by: Sid Falcon LPN (November 01, 8293 12:48 PM)  Orders Added: 1)  Vit B12 1000 mcg [J3420] 2)  Admin of Therapeutic Inj  intramuscular or subcutaneous [62130]

## 2010-07-05 NOTE — Assessment & Plan Note (Signed)
Summary: NEW PT EST // RS   Vital Signs:  Patient profile:   77 year old male Height:      67.50 inches Weight:      184 pounds O2 Sat:      97 % on Room air Temp:     98.2 degrees F oral Pulse rate:   70 / minute Pulse rhythm:   regular Resp:     12 per minute BP sitting:   130 / 80  (left arm) Cuff size:   regular  Vitals Entered By: Sid Falcon LPN (July 20, 2009 10:11 AM)  O2 Flow:  Room air CC: New pt to establish, Hypertension Management, Lipid Management Is Patient Diabetic? No   History of Present Illness: New patient to establish care.  no old records yet for review. Complicated past medical history.  Multiple chronic problems including history of mild nonobstructive CAD by catheterization in 2009, what sounds like ASD repair 1983, prior history of CVA, history of upper GI bleed thought esophagitis versus peptic ulcer disease, history left Bell's palsy, hypertension, atrial fibrillation,hyperlipidemia, GERD, peripheral neuropathy, BPH, and what sounds like possible previous episode of ischemic colitis. History obtained from patient.  No recent chest pains.  Afib rate controlled. Chronic coumadin followed previously in [facility].  No bleeding complications.  patient's medications reviewed. Coumadin has been followed in [place] previously. No recent bleeding complications. He cannot recall and labs were done but not done within several months.  BPH sxs stable on doxazosin. No orthostatic side effects.  patient has some concerns regarding possible short-term memory loss. Also complains of some chronic numbness in both feet. Not clear if this is been worked up previously.  No hx diabetes.  no assoc weakness.  Numbness present for several years and not clearly progressive.  Patient is an ex-smoker. Married. Retired.  Anticoagulation Management History:      Positive risk factors for bleeding include an age of 47 years or older, history of CVA/TIA, and history of  GI bleeding.  The bleeding index is 'high risk'.  Positive CHADS2 values include History of HTN, Age > 33 years old, and Prior Stroke/CVA/TIA.  Negative CHADS2 values include History of Diabetes.    Hypertension History:      He denies headache, chest pain, palpitations, dyspnea with exertion, orthopnea, PND, peripheral edema, neurologic problems, syncope, and side effects from treatment.        Positive major cardiovascular risk factors include male age 12 years old or older, hyperlipidemia, and hypertension.  Negative major cardiovascular risk factors include no history of diabetes, negative family history for ischemic heart disease, and non-tobacco-user status.        Positive history for target organ damage include ASHD (either angina/prior MI/prior CABG) and prior stroke (or TIA).  Further assessment for target organ damage reveals no history of peripheral vascular disease.    Lipid Management History:      Positive NCEP/ATP III risk factors include male age 37 years old or older, hypertension, ASHD (either angina/prior MI/prior CABG), and prior stroke (or TIA).  Negative NCEP/ATP III risk factors include non-diabetic, no family history for ischemic heart disease, non-tobacco-user status, no peripheral vascular disease, and no history of aortic aneurysm.       Preventive Screening-Counseling & Management  Alcohol-Tobacco     Smoking Status: quit     Year Started: 1955     Year Quit: 1983  Allergies: 1)  ! Pcn  Past History:  Social History: Last updated: 07/20/2009  Retired Married Former Smoker Alcohol use-no  Risk Factors: Smoking Status: quit (07/20/2009)  Past Medical History: Current Problems:  Hx of UPPER GASTROINTESTINAL HEMORRHAGE (ICD-578.9) CEREBROVASCULAR ACCIDENT (ICD-434.91) GASTRIC POLYP (ICD-211.1) DYSPHAGIA UNSPECIFIED (ICD-787.20) CORONARY ARTERY DISEASE (ICD-414.00) FIBRILLATION, ATRIAL (ICD-427.31) HYPERTENSION (ICD-401.9) ESOPHAGITIS  (ICD-530.10) GERD (ICD-530.81) EROSIVE GASTRITIS (ICD-535.40) COLONIC POLYPS (ICD-211.3) GASTRIC ULCER (ICD-531.90) HIATAL HERNIA (ICD-553.3) INTERNAL HEMORRHOIDS (ICD-455.0) 1. Atrial fibrillation  2. mild nonobstructive coronary     disease at catheterization 2009 3. Chronic gastrointestinal blood loss probably related either     esophagitis or peptic ulcer disease necessitating lower dose of     Coumadin therapy. 4. Hypertension.  BPH  Past Surgical History: Cholecystectomy Atrioseptal defect repair 1983 Inguinal hernia 1963 Partial small bowel resection 2005 ?ischemic PMH-FH-SH reviewed for relevance  Social History: Retired Married Former Smoker Alcohol use-no Smoking Status:  quit  Review of Systems       The patient complains of weight gain.  The patient denies anorexia, fever, weight loss, vision loss, decreased hearing, hoarseness, chest pain, syncope, peripheral edema, prolonged cough, headaches, hemoptysis, abdominal pain, melena, hematochezia, severe indigestion/heartburn, hematuria, incontinence, muscle weakness, suspicious skin lesions, transient blindness, depression, unusual weight change, enlarged lymph nodes, and testicular masses.    Physical Exam  General:  Well-developed,well-nourished,in no acute distress; alert,appropriate and cooperative throughout examination Head:  Normocephalic and atraumatic without obvious abnormalities. No apparent alopecia or balding. Eyes:  pupils equal, pupils round, and pupils reactive to light.   Ears:  External ear exam shows no significant lesions or deformities.  Otoscopic examination reveals clear canals, tympanic membranes are intact bilaterally without bulging, retraction, inflammation or discharge. Hearing is grossly normal bilaterally. Nose:  External nasal examination shows no deformity or inflammation. Nasal mucosa are pink and moist without lesions or exudates. Mouth:  has upper and lower dentures. No lesions.  Moist mucosa Neck:  No deformities, masses, or tenderness noted.no carotid bruits Chest Wall:  No deformities, masses, tenderness or gynecomastia noted. Lungs:  Normal respiratory effort, chest expands symmetrically. Lungs are clear to auscultation, no crackles or wheezes. Heart:  irregularly irregular rhythm rate controlled Abdomen:  soft, non-tender, normal bowel sounds, no distention, no masses, no hepatomegaly, and no splenomegaly.   Genitalia:  may have small right hydrocele. No hernia. No testicular mass. Uncircumcised Extremities:  trace pitting edema lower legs bilaterally he states this is chronic and unchanged Neurologic:  patient has some left facial weakness from prior Bell's palsy including involvement of left forehead.  alert & oriented X3, strength normal in all extremities, and gait normal.   Skin:  Intact without suspicious lesions or rashes Cervical Nodes:  No lymphadenopathy noted Psych:  Oriented X3, normally interactive, good eye contact, not anxious appearing, and not depressed appearing.     Impression & Recommendations:  Problem # 1:  CORONARY ARTERY DISEASE (ICD-414.00)  His updated medication list for this problem includes:    Enalapril Maleate 10 Mg Tabs (Enalapril maleate) .[name]... Take one tablet by mouth twice a day    Hydrochlorothiazide 25 Mg Tabs (Hydrochlorothiazide) ..... Once daily    Doxazosin Mesylate 4 Mg Tabs (Doxazosin mesylate) ..... Once daily    Norvasc 5 Mg Tabs (Amlodipine besylate) .[name]... 1 once daily  Orders: TLB-Lipid Panel (80061-LIPID)  Problem # 2:  HYPERTENSION (ICD-401.9)  His updated medication list for this problem includes:    Enalapril Maleate 10 Mg Tabs (Enalapril maleate) .[name]... Take one tablet by mouth twice a day    Hydrochlorothiazide 25 Mg Tabs (Hydrochlorothiazide) ..... Once  daily    Doxazosin Mesylate 4 Mg Tabs (Doxazosin mesylate) ..... Once daily    Norvasc 5 Mg Tabs (Amlodipine besylate) .[name]... 1 once  daily  Orders: TLB-BMP (Basic Metabolic Panel-BMET) (80048-METABOL)  Problem # 3:  HYPERLIPIDEMIA (ICD-272.4)  His updated medication list for this problem includes:    Zocor 20 Mg Tabs (Simvastatin) ..... Once daily  Orders: TLB-Lipid Panel (80061-LIPID) TLB-Hepatic/Liver Function Pnl (80076-HEPATIC) Venipuncture (04540)  Problem # 4:  FIBRILLATION, ATRIAL (ICD-427.31)  His updated medication list for this problem includes:    Warfarin Sodium 5 Mg Tabs (Warfarin sodium) .[name]... Take as directed    Norvasc 5 Mg Tabs (Amlodipine besylate) .[name]... 1 once daily  Orders: TLB-PT (Protime) (85610-PTP) Venipuncture (98119)  Problem # 5:  PERIPHERAL NEUROPATHY (ICD-356.9) check B12 and TSH. Orders: TLB-TSH (Thyroid Stimulating Hormone) (84443-TSH) TLB-B12, Serum-Total ONLY (14782-N56) Venipuncture (21308)  Problem # 6:  BENIGN PROSTATIC HYPERTROPHY, HX OF (ICD-V13.8)  Complete Medication List: 1)  Omeprazole 20 Mg Cpdr (Omeprazole) .... 2 caps daily 2)  Enalapril Maleate 10 Mg Tabs (Enalapril maleate) .... Take one tablet by mouth twice a day 3)  Hydrochlorothiazide 25 Mg Tabs (Hydrochlorothiazide) .... Once daily 4)  Warfarin Sodium 5 Mg Tabs (Warfarin sodium) .... Take as directed 5)  Doxazosin Mesylate 4 Mg Tabs (Doxazosin mesylate) .... Once daily 6)  Zocor 20 Mg Tabs (Simvastatin) .... Once daily 7)  Norvasc 5 Mg Tabs (Amlodipine besylate) .[name].. 1 once daily 8)  Gabapentin 300 Mg Caps (Gabapentin) .... Take one tab two times a day 9)  Tandem Plus 162-115.2-1 Mg Caps (Fefum-fepo-fa-b cmp-c-zn-[id number]-cu) .[name].. 1 tab once daily  Anticoagulation Management Assessment/Plan:      The patient's current anticoagulation dose is Warfarin sodium 5 mg tabs: take as directed.         Hypertension Assessment/Plan:      The patient's hypertensive risk group is category C: Target organ damage and/or diabetes.  Today's blood pressure is 130/80.    Lipid Assessment/Plan:      Based on NCEP/ATP  III, the patient's risk factor category is "history of coronary disease, peripheral vascular disease, cerebrovascular disease, or aortic aneurysm".  The patient's lipid goals are as follows: Total cholesterol goal is 200; LDL cholesterol goal is 100; HDL cholesterol goal is 40; Triglyceride goal is 150.    Patient Instructions: 1)  Please schedule a follow-up appointment in 1 month.

## 2010-09-08 LAB — POCT I-STAT, CHEM 8
BUN: 9 mg/dL (ref 6–23)
Calcium, Ion: 1.13 mmol/L (ref 1.12–1.32)
Creatinine, Ser: 0.9 mg/dL (ref 0.4–1.5)
Glucose, Bld: 137 mg/dL — ABNORMAL HIGH (ref 70–99)
TCO2: 29 mmol/L (ref 0–100)

## 2010-09-08 LAB — TYPE AND SCREEN: Antibody Screen: NEGATIVE

## 2010-09-08 LAB — PROTIME-INR: Prothrombin Time: 19.1 seconds — ABNORMAL HIGH (ref 11.6–15.2)

## 2010-09-26 ENCOUNTER — Other Ambulatory Visit: Payer: Self-pay | Admitting: *Deleted

## 2010-09-26 MED ORDER — SIMVASTATIN 20 MG PO TABS: 20.0000 mg | ORAL_TABLET | Freq: Every evening | ORAL | Status: DC

## 2010-10-08 ENCOUNTER — Other Ambulatory Visit: Payer: Self-pay | Admitting: Family Medicine

## 2010-10-10 ENCOUNTER — Other Ambulatory Visit: Payer: Self-pay | Admitting: *Deleted

## 2010-10-10 MED ORDER — DOXAZOSIN MESYLATE 4 MG PO TABS: 4.0000 mg | ORAL_TABLET | Freq: Every day | ORAL | Status: DC

## 2010-10-16 NOTE — Assessment & Plan Note (Signed)
[facility]                            CARDIOLOGY OFFICE NOTE   RAIJON, LINDFORS                       MRN:          [id number]  DATE:01/27/2008                            DOB:          18-May-1933    PRIMARY CARE PHYSICIAN:  Lindaann Pascal, PA, [facility].   BRIEF CLINICAL HISTORY:  Briar Witherspoon returns for followup management of  his atrial fibrillation today.  He was hospitalized in January with  chest pain and orthopnea __________ mild obstructive coronary disease  and ejection fraction of 55%.  He has chronic atrial fibrillation along  with __________ diltiazem __________.  Over the last several months, the  patient is doing fairly well __________ he has had no presyncope or  __________ dizziness __________ history of __________ GI bleeding,  __________peptic ulcer disease __________.  Past history significant for  __________.   Medications include __________, hydrochlorothiazide, warfarin,  doxazosin, Zocor and diltiazem 120 mg daily.   PHYSICAL EXAMINATION:  VITAL SIGNS:  On examination, blood pressure is  152/69, pulse 57 and regular.  NECK:  There are no vein distention.  Carotid pulses were full without  bruits.  CHEST: Clear without rales or rhonchi.  CARDIAC:  Rhythm was irregular.  I could hear no murmurs.  ABDOMEN:  Soft with normal bowel sounds.  Peripheral pulses __________   Electrocardiogram shows some atrial fibrillation with a rate of 52,  there was one PVC.  There is right bundle-branch block.   IMPRESSION:  1. Chronic atrial fibrillation.  2. Obstructive coronary artery disease.  3. History of gastrointestinal blood loss related to esophagitis.[name]  4. Peptic ulcer disease necessitating __________ therapy.   RECOMMENDATION:  Tyshaun is doing fairly well.  His rate control seems to  be okay at present.  I told him if he develops any severe lightheaded  spells or presyncope, to call us, and we would  consider stopping his  Cardizem or getting a monitor or both.  I will plan to see him back in  followup in 1 year.     Bruce Elvera Lennox Juanda Chance, MD, [facility]  Electronically Signed    BRB/MedQ  DD: 01/27/2008  DT: 01/28/2008  Job #: [id number]

## 2010-10-16 NOTE — Discharge Summary (Signed)
NAME:  [name], [name]                ACCOUNT NO.:  [id number]   MEDICAL RECORD NO.:  [id number]          PATIENT TYPE:  INP   LOCATION:  4707                         FACILITY:  [facility]   PHYSICIAN:  Darrell Beals. Juanda Chance, MD, FACCDATE OF BIRTH:  20-Feb-1933   DATE OF ADMISSION:  06/26/2007  DATE OF DISCHARGE:  06/30/2007                               DISCHARGE SUMMARY   PRIMARY CARDIOLOGIST:  Darrell Beals. Juanda Chance, MD, [facility].   PRIMARY CARE [name]:  [name], M.D., [facility].   DISCHARGE DIAGNOSIS:  Chest pain.   SECONDARY DIAGNOSES:  1. Nonobstructive coronary artery disease by cardiac catheterization      this admission.  2. Chronic atrial fibrillation.  3. Chronic Coumadin anticoagulation.  4. Hypertension.  5. Hyperlipidemia.  6. Congenital atrial septal defect, status post ASD repair in 1983.  7. Status post cerebrovascular accident with residual right facial      weakness.  8. Benign prostatic hypertrophy.  9. History of upper gastrointestinal bleed, felt to be secondary to      gastritis.  10.Peptic ulcer disease, status post esophagogastroduodenoscopy August      2008, followed by [name].  11.Melena with stable hemoglobin and hematocrit on this admission.  12.Status post cholecystectomy.  13.Status post herniorrhaphy.  14.Status post small-bowel resection.  15.Remote tobacco abuse.   ALLERGIES:  PENICILLIN AND IV CONTRAST.   PROCEDURE:  Left heart cardiac catheterization.   HISTORY OF PRESENT ILLNESS:  A 77 year old Caucasian male with the above  problem list.  He was seen by Dr. Christell [name] on January 23 secondary to  complaints of left-sided chest aching, worse over the past couple of  weeks.  The night prior to admission, he had worsening of symptoms,  prompting his office visit and subsequent transfer via EMS to [facility].  The patient was admitted for further evaluation and rule out.   HOSPITAL COURSE:  [name] ruled out for MI by  cardiac markers.  He  was scheduled for and underwent left heart cardiac catheterization on  January 26,  revealing nonobstructive coronary artery disease with  normal LV function and an EF 55% to 60%.  It was recommended that he be  maintained on medical therapy.  This morning, January 27, he did note a  dark stool although his H&Hs have remained stable at 11.9 and 35.4.  He  does have a history of GI bleed, previously followed by [name].  We have discontinued his aspirin and have resumed his Coumadin with a  goal INR right at 2.0 given his history of chronic GI blood loss.  We  will plan to follow up CBC in 1 week, and he is advised to report any  additional melena to Dr. Christell [name] and/or [name].   On admission, [name] was noted to be bradycardic, in atrial  fibrillation with rates in the 40s.  As a result, his Diltiazem was  held.  He has had no additional bradycardia off Diltiazem; however, his  rates have now climbed into the 90s and 110s and sometimes higher  with  minimal activity.  We have therefore gone ahead and reinstituted  Diltiazem at 120 mg daily, and we will set him up for a 24-hour Holter  monitor to determine how he tolerates that medication.  If bradycardia  becomes an issue (he has been asymptomatic up to this point) would have  to consider permanent pacemaker in order to effectively treat his A Fib.   DISCHARGE LABORATORIES:  Hemoglobin 11.9, hematocrit 35.4, WBC 4.4,  platelets 159, INR 1.2.  Sodium 142, potassium 4.3, chloride 107, CO2  30, BUN 11, creatinine 0.83, glucose 95, calcium 9.2, total bilirubin  0.9, alkaline phosphatase 50, AST 24, ALT 13, total protein 6.6, albumin  3.6, hemoglobin A1c 6.0, CK 78, MB 1.3, troponin-I less than 0.01, total  cholesterol 91, triglycerides 52, HDL 40, LDL 41.  TSH 1.010.   DISPOSITION:  The patient is being discharged home today in good  condition.   FOLLOWUP PLANS AND APPOINTMENTS:  He has followup with  [name] on  February 17 at 3:30 p.m.  We have provided him with prescription for a  repeat CBC in approximately 1 week, and that can be performed at [facility].  We will contact him to arrange for a 24-  hour Holter monitor.   DISCHARGE MEDICATIONS:  Diltiazem ER 120 mg daily, Coumadin as  previously prescribed,  Prilosec 20 mg b.i.d., HCTZ 25 mg daily, Cardura  4 mg q.h.s., Vasotec 5 mg b.i.d., Zocor 20 mg q.h.s.   OUTSTANDING LABORATORY STUDIES:  CBC and PT/INR later this week.   DURATION OF DISCHARGE ENCOUNTER:  Sixty minutes, including physician  time.      Nicolasa Ducking, ANP      [name], MD, [facility]  Electronically Signed    CB/MEDQ  D:  06/30/2007  T:  06/30/2007  Job:  [id number]   cc:   [name], M.D.  Hedwig Morton. Juanda Chance, MD

## 2010-10-16 NOTE — Letter (Signed)
January 19, 2007    Lindaann Pascal, [place]  [address]  [place], [place] [address]   RE:  [name], [name]  MRN:  [id number]  /  DOB:  August 07, 1932   Dear Lorin Picket:   I would like to let you know that Mr. Coran cancelled his upper  endoscopy shortly after we scheduled him for it and after I sent him  home with samples of Protonix 40 mg twice a day.  He has not  rescheduled.  His hemoglobin today was 13, hematocrit 37.9, and he was  feeling fine.  I still think he ought to have an upper endoscopy because  he has a history of erosive gastritis which tends to bleed, and it would  be important to know if the gastritis persists or if there are other  lesions such as esophagitis or duodenal ulcer.  I will let you know if  he reschedules.    Sincerely,      Hedwig Morton. Juanda Chance, MD  Electronically Signed    DMB/MedQ  DD: 01/19/2007  DT: 01/19/2007  Job #: [id number]

## 2010-10-16 NOTE — Cardiovascular Report (Signed)
[name], [name]                ACCOUNT NO.:  [id number]   MEDICAL RECORD NO.:  [id number]          PATIENT TYPE:  INP   LOCATION:  4707                         FACILITY:  [facility]   PHYSICIAN:  Veverly Fells. Excell Seltzer, MD  DATE OF BIRTH:  1933-06-01   DATE OF PROCEDURE:  06/29/2007  DATE OF DISCHARGE:                            CARDIAC CATHETERIZATION   PROCEDURE:  Left heart catheterization, selective coronary angiography,  left ventricular angiography, StarClose of the right femoral artery.   INDICATIONS:  [name] is a 77 year old gentleman with known CAD.  He  had a cath back in 2001 that showed moderate disease in the right  coronary artery and minimal disease in the LAD.  He has presented with  progressive angina over the last several weeks.  His symptoms are fairly  typical.  He was referred for cardiac catheterization.  His initial  biomarkers have been negative.  He is maintained on Coumadin for atrial  fibrillation and prior embolic sequelae but his Coumadin has been held  and his INR was appropriate for cardiac cath this morning.   DESCRIPTION OF PROCEDURE:  Risks and indications of procedure were  reviewed with the patient.  Informed consent was obtained.  The right  groin was prepped, draped, and anesthetized with 1% lidocaine.  Using  modified Seldinger technique, 6-French sheath was placed in the right  femoral artery.  Standard 6-French Judkins catheters were used for  coronary angiography.  A 6-French angled pigtail catheter was used for  left ventriculography.  A pullback was recorded across the aortic valve.  At completion of the procedure, a StarClose device was used to seal the  femoral arteriotomy.   FINDINGS:  Aortic pressure 148/71 with a mean of 103.  Left ventricular  pressure 147/11.   Coronary angiography:  Left mainstem:  Widely patent.  No significant plaque.  Trifurcates into  LAD intermediate branch and left circumflex.   LAD:  The LAD is a  large-caliber vessel.  It reaches the LV apex.  The  LAD supplies a medium-size first diagonal branch.  There are no other  significant diagonals from the mid or distal portion of the vessel.  Just after the first diagonal there is a 30% stenosis.  The remaining  portions of the vessel appear angiographically normal.   Ramus:  The ramus intermedius is medium caliber.  There is no  significant angiographic stenosis.   Left circumflex.  The left circumflex is small.  There is a small branch  and second and third OM territories.  No significant angiographic  stenosis.   Right coronary artery:  The right coronary artery is dominant.  Proximal  portion of the vessel has 20% stenosis, mid portion 40% stenosis.  The  distal portion has nonobstructive plaque.  The vessel terminates in a  PDA and two PL branches.  There is no significant stenosis in the branch  vessels of the right coronary artery.  Just before the origin of the PDA  and posterior AV segment, there is a focal 50% stenosis.   Left ventricular function is normal.  The LVEF is  estimated 55-60%.   ASSESSMENT:  1. Non-obstructive coronary artery disease, mainly involving the right      coronary artery with minimal disease in the left anterior      descending.  2. Normal left ventricular function.  3. Normal left ventricular filling pressures.   PLAN:  [name] should respond well to medical therapy.  He has no  hemodynamically significant coronary stenoses.      Veverly Fells. Excell Seltzer, MD  Electronically Signed     MDC/MEDQ  D:  06/29/2007  T:  06/29/2007  Job:  [id number]   cc:   Everardo Beals. Juanda Chance, MD, [facility]  Ernestina Penna, M.D.

## 2010-10-16 NOTE — Assessment & Plan Note (Signed)
[facility]                         GASTROENTEROLOGY OFFICE NOTE   [name], [name]                       MRN:          [id number]  DATE:01/19/2007                            DOB:          [birth year]    [name] is a very nice 77 year old gentleman who is here today for  evaluation of recurrent upper GI bleeding. We have known [name] for  at least 10 years because of intermittent upper GI blood loss from a  hiatal hernia and gastritis. He has been chronically anticoagulated with  Coumadin because of severe heart disease. In the last 3 years [name]  has done quite well keeping his INR between 2 to 3. This was the first  episode of melena which he had last week 2 days in a row, with some  associated diffuse abdominal discomfort more so in epigastrium. There  was no hematemesis. His hemoglobin was 10 grams in Dr. Edwin Dada office.  Since then his stools have cleared up, and he has continued on his  Prilosec 20 mg p.o. b.i.d., but overall he has been quite weak. He has  also had some dysphagia to solids and liquids intermittently. Last upper  endoscopy in 2003 showed large hiatal hernia with small gastric polyps,  a hiatal hernia was prolapsing at 4 cm in length. He also had erosions  of the G-junction. [name] has continued with Coumadin because of  history of stroke. He has a right facial weakness. He needs to stay on  Coumadin.   MEDICATIONS:  1. Omeprazole 20 mg p.o. b.i.d.  2. Cartia 180 mg p.o. b.i.d.  3. Enalapril 5 mg p.o. b.i.d.  4. Hydrochlorothiazide 25 mg p.o. daily.  5. Coumadin, [name] has reduced the dose to 2.5 mg daily from 5 mg      twice a week and 2.5 mg the remaining days.  6. Doxazosin 4 mg p.o. daily.  7. Zocor 20 mg p.o. daily.   PAST MEDICAL HISTORY:  Significant for upper GI bleed, heart disease. He  had a colonoscopy in 2005 with findings of internal hemorrhoids. He also  had a cholecystectomy.   FAMILY  HISTORY:  Positive for heart disease in father and mother.   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 He has 2 children. [name] does not smoke and  does not drink alcohol.   REVIEW OF SYSTEMS:  Weight loss of about 10 or 15 pounds due to  decreased appetite.   PHYSICAL EXAMINATION:  Blood pressure 126/62, pulse 64, and weight 172  pounds. [name] had a visible right facial weakness with drooping of the  mouth. He was alert and oriented.  NECK: Veins were not distended.  LUNGS: Clear to auscultation.  COR: Irregular S1 and S2.  Inspiratory sound in the chest not related to his heart beat, it  partially transmitted into his abdomen.  ABDOMEN: Soft, mildly tender mostly in the epigastrium but also in the  lower abdomen with normoactive bowel sounds. No bruits. Liver edge about  1 cm below right costal margin. No ascites.  RECTAL  EXAM: Normal rectal tone. Prostate 1 to 2 +. I could not  appreciate any stool specimen in the rectal ampulla. Mucus was heme  negative.  EXTREMITIES: No edema.   IMPRESSION:  A 77 year old gentleman with recurrent upper  gastrointestinal bleed, either due to hiatal hernia and esophagitis or  possibly due to a gastric lesion. He is on chronic anticoagulation but  has been in a therapeutic range. Our goal in the past was to keep his  INR close to 2. Today's hemoglobin in our office showed 13, hematocrit  37.9 with MCV of 101. He had last endoscopy in 2003.   PLAN:  1. Upper endoscopy to assess his bleeding lesion.  2. Continue reduced dose on Coumadin 2.5 mg daily.  3. Instead of Prilosec I gave him samples of Protonix to take 40 mg      twice a day for about 7 days.  4. Mylanta 2 tablespoons twice a day. This will also help his      constipation. His endoscopy will most likely be scheduled for      tomorrow and we will have an opportunity to recheck his hemoglobin      tomorrow.     Hedwig Morton. Juanda Chance, MD   Electronically Signed    DMB/MedQ  DD: 01/19/2007  DT: 01/19/2007  Job #: [id number]   cc:   Ernestina Penna, M.D.

## 2010-10-16 NOTE — H&P (Signed)
NAMEKEM, HENSEN                ACCOUNT NO.:  [id number]   MEDICAL RECORD NO.:  [id number]          PATIENT TYPE:  EMS   LOCATION:  [facility]                         FACILITY:  [facility]   PHYSICIAN:  Veverly Fells. Excell Seltzer, MD  DATE OF BIRTH:  1933/05/20   DATE OF ADMISSION:  06/26/2007  DATE OF DISCHARGE:                              HISTORY & PHYSICAL   PRIMARY CARE PHYSICIAN:  Dr. Rudi Heap, [facility]   BRIEF HISTORY:  Mr. Cooler is a 77 year old white male, who was  transported via [facility] from Dr. Kathi Der office, secondary to  further evaluation of chest discomfort.  It is noted Mr. Head is a  very vague historian.  He states he has been having some left-sided  chest aching sensation without radiation for a long time, but it has  been worse in the last couple of weeks.  He states that, last night  after working at [employer], finishing up at around 11:30 p.m., he stated  that his heart was really cutting up.  He would not, or could not  elaborate.  He states that he did not sleep well, but that is usual for  him.  Around 5 o'clock, he did awaken with continuing chest discomfort.  Thus, he presented to his primary care physician's office at around 8  a.m.  An EKG did not show any acute changes and he received a sublingual  nitroglycerin at Dr. Kathi Der office with resolution.  However, the  discomfort came back during transportation and he states that the EMS  gave him another sublingual nitroglycerin with resolution.  He states  the discomfort is not pleuritic, does not change with movement and is  not tender.  Primarily, he notices it with lifting.  However, he  continues to work through the discomfort until it eventually goes away.  I get the impression that it can last up to two hours.  Patient states  he is always short of breath and is not sure if this becomes worse with  the chest discomfort.  He denies nausea or vomiting, but does admit to  diaphoresis.  It is unclear if this actually occurs with the chest  discomfort.   PAST MEDICAL HISTORY:   ALLERGIES:  Include PENICILLIN.   MEDICATIONS PRIOR TO ADMISSION INCLUDE:  1. Coumadin 2.5 mg daily, except for 5 mg on Friday.  2. Prilosec 20 mg daily.  3. HCTZ 25 mg daily.  4. Cartia XT 180 daily.  5. Cardura 4 mg daily.  6. Vasotec 5 mg daily.  7. Zocor 20 mg q.h.s.   PAST MEDICAL HISTORY:  He has a history of hyperlipidemia.  The last  lipids that we have are from January 2006, that showed a total  cholesterol of 163, triglycerides 118, HDL 34, LDL 105.  He also has a  history of upper GI bleed, secondary to hiatal hernia and gastritis.  Via his E-chart note I have from Dr. Lina Sar, there is a letter to  his primary care physician, saying patient canceled his scheduled EGD.  He has a history of possible  CVA with residual right facial weakness,  BPH, hypertension.  He also has a history of ASD repair in 1983 and  there is a reported history of rheumatic fever in 1983.  Last  catheterization was on May 12, 2000, by Dr. Juanda Chance.  This showed  tandem 30 and 30% lesions in the mid-LAD, 70% distal RCA, EF 60%.  It  was commented that there is not any renal artery stenosis or peripheral  vascular disease.  He has long-standing persistent atrial fibrillation  with a right bundle branch block.  His Coumadin is regulated by [facility].  Last INR was on the 20th and was 1.4.  He  also has a history of herniorrhaphy, cholecystectomy and small bowel  resection secondary to a clot.   SOCIAL HISTORY:  He resides in [place] with his wife.  He has two  children, two grandchildren, one great grandchild.  He is retired.  However, he works part-time in the Public affairs consultant at [employer].  He has not smoked since 1983.  Prior to that, he smoked a pack per day  for unknown duration.  He denies any alcohol, drugs, herbal medications.  He does not follow a  specific diet and the only exercise he gets is at  work.   FAMILY HISTORY:  His mother died in her seventies from old age, history  of hypertension.  His father also in his seventies from heart problems,  history of hypertension.  He has one brother, deceased at age 37 with  prostate cancer.  He does not have any sisters.   REVIEW OF SYSTEMS:  In addition to the above, is notable for a 20-pound  weight-loss in the last year, reading glasses, hearing loss, dentures,  bruises easily, dyspnea on exertion with climbing stairs, he seems to  think has worsened; rare cough, decreased appetite with decreased sleep  ability, urinary frequency, nocturia, dysphagia with food and cold  intolerance.  All other systems are unremarkable.   PHYSICAL EXAM:  GENERAL:  Well-nourished, well-developed, pleasant,  white male in no apparent distress.  Daughter is present.  VITAL SIGNS:  Temperature is 97.8, blood pressure 138/65, pulse 68,  respirations 20, 97% sat on room air.  HEENT:  Unremarkable, except for dentures, and he does have left facial  droop, which according to the patient and family is old.  NECK:  Supple without thyromegaly, adenopathy, JVD or carotid bruits.  CHEST:  Symmetrical excursion, decreased breath sounds, but clear to  auscultation.  HEART:  PMI is not displaced.  He has irregularly irregular distant  heart sounds, variable S1, normal S2.  I do not appreciate any murmurs,  rubs, clicks or gallops.  All pulses are symmetrical and intact.  I did  not appreciate any abdominal bruits or femoral bruits.  ABDOMEN:  Slightly rounded.  Bowel sounds present without organomegaly,  masses or tenderness.  EXTREMITIES:  Negative cyanosis, clubbing or edema.  MUSCULOSKELETAL:  Unremarkable.  NEUROLOGIC:  Unremarkable.   LABORATORY DATA:  Labs and chest x-ray are pending at the time of this  dictation, but have been ordered.  An EKG, here in the emergency room,  shows atrial fibrillation  with a rate of 69, right bundle branch block,  without changes since December 2001.   IMPRESSION:  1. Vague chest discomfort, somewhat difficult to obtain a history.  2. Known nonobstructive coronary artery disease by cardiac      catheterization in 2001.  3. Longstanding persistent atrial fibrillation with a subtherapeutic  INR on June 22, 2006.  4. Remote tobacco use.  5. History as listed per Past Medical History, above.   DISPOSITION:  Dr. Excell Seltzer reviewed patient's history, spoke with and  examined the patient and agrees with the above.  We will admit him to  [facility] telemetry bed to rule out myocardial infarction.  We  will hold his Coumadin and, once labs are back, if his INR is less than  2, we will begin IV heparin per pharmacy.  We will continue his home  medications at this time and add aspirin 81 mg daily to his regimen.  We  plan for cardiac  catheterization on Monday, of which the risks and benefits and procedure  have been explained to the patient by Dr. Excell Seltzer.  If, in fact, he does  have coronary artery disease or progressive disease, we will change his  calcium channel blocker to beta blockers.  We will also obtain an  echocardiogram.  Further plans will be based on findings.      Joellyn Rued, PA-C      Veverly Fells. Excell Seltzer, MD  Electronically Signed    EW/MEDQ  D:  06/26/2007  T:  06/26/2007  Job:  [id number]   cc:   Ernestina Penna, M.D.

## 2010-10-16 NOTE — Assessment & Plan Note (Signed)
[facility]                            CARDIOLOGY OFFICE NOTE   RODMAN, RECUPERO                       MRN:          [id number]  DATE:07/21/2007                            DOB:          [birth year]    PRIMARY CARE PHYSICIAN:  Acupuncturist long PA at [employer].   MEDICAL HISTORY:  The patient returned for follow-up visit after his  recent hospitalization.  Hospitalized for chest pain and underwent  catheterization which showed only mild nonobstructive disease and good  LV function with ejection fraction of 55%.  He has chronic atrial  fibrillation and some slow rates in the hospital and we were concerned  he might need a pacemaker.  We cut back Diltiazem from 180-120 a day and  arranged and have an outpatient monitor and he also had a CBC.   He is on chronic Coumadin therapy for his atrial fibrillation but had  intermittent GI bleeding.  His and seen by Dr. Lina Sar for this and  has been found to have some esophagitis.  We have been keeping his INRs  a little lower because of this.   PAST HISTORY:  Is significant for hypertension and peptic ulcer disease  and he is status post an atrial septal defect repair.   CURRENT MEDICATIONS:  Include Prilosec, enalapril, hydrochlorothiazide,  warfarin, doxazosin, Zocor and Diltiazem.   EXAMINATION:  Today the blood pressure is 121/67 and pulse 55 and  regular.  There was no venous distension .  The carotid pulses were full without  bruits.  CHEST:  Was clear.  HEART:  Rhythm was irregular.  He had no murmurs or gallops.  ABDOMEN:  Soft.  Normal bowel sounds.  There is no hepatosplenomegaly.  Peripheral pulses full.  No edema.   The ECG showed atrial fibrillation with a rate of 60.  There was right  bundle branch block.   IMPRESSION:  1. Atrial fibrillation with some slow rates.  No improved lower dose      of Cardizem.  2. Recent episode of chest pain with mild nonobstructive  coronary      disease at catheterization.  3. Chronic gastrointestinal blood loss probably related either      esophagitis or peptic ulcer disease necessitating lower dose of      Coumadin therapy.  4. Hypertension.   RECOMMENDATIONS:  Jantzen appears to be doing better.  Had no major  episodes of tachy palpitations and has had no presyncope.  We are still  waiting on the monitor results and hemoglobin results from Dr. Kathi Der  office.  Unless we see something on the monitor will plan to continue to  manage him with a lower dose of Cardizem.  Considering has had GI blood  loss, I would recommend keeping his INR in the 1.5-2.0 range for the 2.0-  3.0 range.  According to Mr. Badolato this usually requires a dose of 5 mg  1/2 of Coumadin daily for this range.  We will defer  this to [facility] with these recommendations.  Will plan to see him  back in  follow-up in 6 months.     Bruce Elvera Lennox Juanda Chance, MD, [facility]  Electronically Signed    BRB/MedQ  DD: 07/21/2007  DT: 07/22/2007  Job #: [id number]   cc:   Lindaann Pascal, PA

## 2010-10-19 ENCOUNTER — Other Ambulatory Visit: Payer: Self-pay | Admitting: *Deleted

## 2010-10-19 MED ORDER — ENALAPRIL MALEATE 10 MG PO TABS: 10.0000 mg | ORAL_TABLET | Freq: Two times a day (BID) | ORAL | Status: DC

## 2010-10-19 NOTE — Cardiovascular Report (Signed)
[place]. [facility]  Patient:    [name], [name]                       MRN: [id number] Proc. Date: 05/12/00 Adm. Date:  [id number] Disc. Date: [id number] Attending:  Benny Lennert                        Cardiac Catheterization  INDICATIONS:  Mr. Bou is 77 years old and has had a previous ASD repair and has chronic atrial fibrillation.  He was admitted with progressive chest pain, suggestive of unstable angina and scheduled catheterization.  DESCRIPTION OF PROCEDURE:  The procedure was performed via right femoral artery using an arterial sheath and 6-French preformed coronary catheters.  A femoral artery puncture was performed and Omnipaque contrast was used.  The right femoral artery was closed with Perclose at the end of the procedure.  A distal aortogram was performed to rule out abdominal aortic aneurysm.  The patient tolerated the procedure well and left the laboratory in satisfactory condition.  RESULTS: 1. LEFT MAIN CORONARY ARTERY:  The left main coronary artery was free of    significant disease.  2. LEFT ANTERIOR DESCENDING ARTERY:  The left anterior descending coronary    artery gave rise to two septal perforators and a diagonal branch.  There    were tandem 30% stenoses in the mid LAD with irregularities in the    proximal and mid LAD.  3. CIRCUMFLEX ARTERY:  The circumflex artery gave rise to a large intermediate    branch and marginal and posterolateral branches.  These vessels were free    of significant disease.  4. RIGHT CORONARY ARTERY:  The right coronary artery was a moderately large    vessel that gave rise to a right ventricular branch, a posterior    descending branch and two posterolateral branches.  There was 70%    narrowing in the distal right coronary artery.  There were irregularities    in the mid vessel.  LEFT VENTRICULOGRAM:  The left ventriculogram performed in the RAO projection showed good wall motion with no  areas of hypokinesis.  The estimated ejection fraction was 60%.  DISTAL AORTOGRAM:  A distal aortogram was performed which showed two renal arteries on the right and one on the left.  There was no significant stenosis of the renal arteries and no significant aortoiliac obstruction.  CONCLUSIONS:  Nonobstructive coronary artery disease with good left ventricular function.  RECOMMENDATIONS:  Reassurance  The patient had recent endoscopy which he says was normal.  It is unlikely his symptoms are related to reflux.  These symptoms may be related to chest wall pain.  Will plan further evaluation if the patients symptoms persist and will plan to discharge him later today. DD:  05/12/00 TD:  05/12/00 Job: [id number]

## 2010-10-19 NOTE — Discharge Summary (Signed)
[place]. [facility]  Patient:    [name], [name]                       MRN: [id number] Adm. Date:  [id number] Disc. Date: 05/12/00 Attending:  Benny [name] Dictator:   [name], P.A.C. CC:         [name], M.D. [facility]  Dr. ________, [facility]   Discharge Summary  HISTORY OF PRESENT ILLNESS:  [name] is a 77 year old white male who describes a one-week history of intermittent upper chest discomfort with or without activity. On the morning of presentation, it began at approximately 3 a.m., the worst episode so far. He described perfuse diaphoresis, dyspnea, no radiation or nausea. He was brought to the emergency room and was found to be in atrial fibrillation with a ventricular rate of 70s, and he has a chronic history of atrial fibrillation and right bundle branch block. He did receive two sublingual nitroglycerin and finally obtained relief after 30 minutes. He has a history of ASD repair in 1983. His last catheterization in 1983, he did not have any coronary artery disease. Chronic atrial fibrillation for which he is on Coumadin. He has recently been involved in a study called ______ trials which is reviewing Coumadin alternatives, but he was released several months ago secondary to melena. He was recently evaluated for melena. However, he states that an extensive GI workup proved to be negative at [facility], and he resumed his Coumadin on December 6. He has not a PT/INR check since that time.  PAST MEDICAL HISTORY:  He also has a history of small bowel resection, rheumatic fever, cholecystectomy, herniorrhaphy, hypertension, claudication, BPH, and remote tobacco use.  LABORATORY DATA:  CKs and troponins were negative for myocardial infarction. Admission PT was 14.3, with INR of 1.2, PTT 28. H&H was 12.8 and 38.1, normal indices, platelets 197, WBCs 4.4. Sodium 141, potassium 3.5, BUN 13, creatinine 1,  glucose 112.  EKGs show atrial fibrillation with a ventricular rate of 69, right bundle branch block.  HOSPITAL COURSE:  [name] was admitted to 3700. Overnight, he did not have further discomfort. Enzymes and EKGs were negative for myocardial infarction. Catheterization was performed on December 10 by [name]. Juanda [name], M.D. This revealed irregularities in the proximal mid RCA and a 30% distal RCA. The LAD had two 30% mid lesions and some irregularities. Circumflex and ramus were intact. EF was 60% without wall motion abnormalities. He did not have any evidence of peripheral vascular disease. Catheterization site was closed with Perclose. Post his bedrest, he was ambulating without difficulty. Catheterization site was intact. Thus, [name], M.D. felt that he could be discharged home from a cardiac standpoint. He was advised if he had any further discomfort to follow up with his primary care physician.  DISCHARGE DIAGNOSES: 1. Noncardiac chest discomfort. 2. Nonobstructive coronary artery disease. History as previously described.  DISPOSITION:  He is discharged home and asked to continue his medications.  DISCHARGE MEDICATIONS: 1. Coumadin 5 mg 1/2 tablet q.d. 2. Prilosec 20 mg q.d. 3. HCTZ 25 mg q.d. 4. Cartia XT 180 mg q.d. 5. ______  4 mg q.d.  ACTIVITIES:  He is advised no lifting for one week, no driving, sexual activity, or heavy exertion for two days. Given permission to shower on May 13, 2000, and to remove the dressing on May 13, 2000.  DIET:  Asked to maintain a low-salt, fat, and  cholesterol diet.  INSTRUCTIONS:  If he had any problems with his catheterization site, he was asked to call immediately.  FOLLOW-UP:  Our Coumadin clinic will call him at home to arrange a PT/INR check on Thursday or Friday morning. He will see me in the office on December 20, at 10:30 a.m. for groin check. DD:  05/12/00 TD:  05/12/00 Job: [id number]

## 2010-10-26 ENCOUNTER — Other Ambulatory Visit: Payer: Self-pay | Admitting: Family Medicine

## 2010-11-07 ENCOUNTER — Other Ambulatory Visit: Payer: Self-pay | Admitting: Family Medicine

## 2010-11-28 ENCOUNTER — Other Ambulatory Visit: Payer: Self-pay | Admitting: Family Medicine

## 2010-12-07 ENCOUNTER — Other Ambulatory Visit: Payer: Self-pay | Admitting: Family Medicine

## 2010-12-11 ENCOUNTER — Ambulatory Visit (INDEPENDENT_AMBULATORY_CARE_PROVIDER_SITE_OTHER): Payer: Medicare Other | Admitting: Family Medicine

## 2010-12-11 ENCOUNTER — Encounter: Payer: Self-pay | Admitting: Family Medicine

## 2010-12-11 DIAGNOSIS — I10 Essential (primary) hypertension: Secondary | ICD-10-CM

## 2010-12-11 DIAGNOSIS — I2510 Atherosclerotic heart disease of native coronary artery without angina pectoris: Secondary | ICD-10-CM

## 2010-12-11 DIAGNOSIS — K219 Gastro-esophageal reflux disease without esophagitis: Secondary | ICD-10-CM

## 2010-12-11 DIAGNOSIS — I4891 Unspecified atrial fibrillation: Secondary | ICD-10-CM

## 2010-12-11 DIAGNOSIS — R5381 Other malaise: Secondary | ICD-10-CM

## 2010-12-11 DIAGNOSIS — E785 Hyperlipidemia, unspecified: Secondary | ICD-10-CM

## 2010-12-11 DIAGNOSIS — E538 Deficiency of other specified B group vitamins: Secondary | ICD-10-CM

## 2010-12-11 LAB — CBC WITH DIFFERENTIAL/PLATELET
Eosinophils Relative: 1.7 % (ref 0.0–5.0)
HCT: 40.6 % (ref 39.0–52.0)
Lymphs Abs: 0.9 10*3/uL (ref 0.7–4.0)
MCV: 92.3 fl (ref 78.0–100.0)
Monocytes Absolute: 0.5 10*3/uL (ref 0.1–1.0)
Platelets: 149 10*3/uL — ABNORMAL LOW (ref 150.0–400.0)
WBC: 4.8 10*3/uL (ref 4.5–10.5)

## 2010-12-11 LAB — HEPATIC FUNCTION PANEL
AST: 26 U/L (ref 0–37)
Alkaline Phosphatase: 41 U/L (ref 39–117)
Bilirubin, Direct: 0 mg/dL (ref 0.0–0.3)
Total Bilirubin: 0.7 mg/dL (ref 0.3–1.2)

## 2010-12-11 LAB — BASIC METABOLIC PANEL
BUN: 13 mg/dL (ref 6–23)
CO2: 31 mEq/L (ref 19–32)
Calcium: 8.9 mg/dL (ref 8.4–10.5)
Creatinine, Ser: 0.9 mg/dL (ref 0.4–1.5)
Glucose, Bld: 97 mg/dL (ref 70–99)

## 2010-12-11 LAB — LIPID PANEL
HDL: 46.7 mg/dL (ref >39.00)
Total CHOL/HDL Ratio: 2

## 2010-12-11 LAB — TSH: TSH: 0.55 u[IU]/mL (ref 0.35–5.50)

## 2010-12-11 MED ORDER — CLOTRIMAZOLE-BETAMETHASONE 1-0.05 % EX CREA: 1.0000 "application " | TOPICAL_CREAM | Freq: Two times a day (BID) | CUTANEOUS | Status: DC

## 2010-12-11 NOTE — Progress Notes (Signed)
  Subjective:    Patient ID: [name], male    DOB: 1933-02-22, 77 y.o.   MRN: [id number]  HPI Patient seen for medical followup. Chronic problems include history of B12 deficiency, hyperlipidemia, peripheral neuropathy, hypertension, CAD, atrial fibrillation, history of CVA, remote history of gastric ulcer, GERD, erosive gastritis. Also has history of BPH followed by urology.  Recent problems with urination every 3 hours. Occasional slow stream. Takes Cardura.  ?Avodart in past.  INR monitored in [facility]. No recent bleeding complications other than occasional bruising on the forearms.  He has fatigue which is more or less chronic. He has not been compliant with B12 injections in several months. Wonders if this may be contributing to fatigue issues.  Hypertension with meds reviewed. Compliant with medications. No orthostasis.  Past Medical History  Diagnosis Date  . History of upper gastrointestinal hemorrhage   . Cerebrovascular accident   . Gastric polyp   . Dysphagia, unspecified   . Coronary artery disease   . Atrial fibrillation   . Hypertension   . Esophagitis   . GERD (gastroesophageal reflux disease)   . Erosive gastritis   . FH: colonic polyps   . Gastric ulcer   . Hiatal hernia   . Internal hemorrhoids    Past Surgical History  Procedure Date  . Cholecystectomy   . Asd repair [id number]  . Inguinal hernia repair 1963  . Partial small bowel resection 2005    ischemic?    reports that he quit smoking about 27 years ago. His smoking use included Cigarettes. He has a 20 pack-year smoking history. He does not have any smokeless tobacco history on file. His alcohol and drug histories not on file. family history is not on file. Allergies  Allergen Reactions  . Penicillins       Review of Systems  Constitutional: Positive for fatigue. Negative for fever, chills, activity change and unexpected weight change.  HENT: Negative for trouble swallowing.     Respiratory: Negative for cough, shortness of breath and wheezing.   Cardiovascular: Negative for chest pain, palpitations and leg swelling.  Gastrointestinal: Negative for abdominal pain and blood in stool.  Neurological: Positive for weakness (generalized,  nonfocal). Negative for dizziness and syncope.  Hematological: Negative for adenopathy.  Psychiatric/Behavioral: Negative for dysphoric mood.       Objective:   Physical Exam  Constitutional: He is oriented to person, place, and time. He appears well-developed and well-nourished.  HENT:  Mouth/Throat: Oropharynx is clear and moist.  Neck: Neck supple. No thyromegaly present.  Cardiovascular:       Irregularly irregular rhythm rate controlled  Pulmonary/Chest: Effort normal and breath sounds normal. No respiratory distress. He has no wheezes. He has no rales.  Abdominal: Soft. There is no tenderness.  Musculoskeletal: He exhibits no edema.  Lymphadenopathy:    He has no cervical adenopathy.  Neurological: He is alert and oriented to person, place, and time.  Psychiatric: He has a normal mood and affect. His behavior is normal.          Assessment & Plan:  #1 history of chronic atrial fibrillation rate controlled. Coumadin monitored elsewhere #2 history of B12 deficiency. Recheck B12 level. Will likely need continued intramuscular replacement #3 hyperlipidemia recheck lipid and hepatic panel #4 hypertension stable check basic metabolic panel #5 history of anemia recheck CBC  #6 fatigue likely multifactorial. Add TSH and additional labs above

## 2010-12-12 NOTE — Progress Notes (Signed)
Quick Note:  Pt informed and he will call for inj appt ______

## 2010-12-19 ENCOUNTER — Ambulatory Visit (INDEPENDENT_AMBULATORY_CARE_PROVIDER_SITE_OTHER): Payer: Medicare Other | Admitting: Family Medicine

## 2010-12-19 DIAGNOSIS — E538 Deficiency of other specified B group vitamins: Secondary | ICD-10-CM

## 2010-12-19 MED ORDER — CYANOCOBALAMIN 1000 MCG/ML IJ SOLN
1000.0000 ug | Freq: Once | INTRAMUSCULAR | Status: AC
  Administered 2010-12-19: 1000 ug via INTRAMUSCULAR

## 2010-12-27 ENCOUNTER — Other Ambulatory Visit: Payer: Self-pay | Admitting: Family Medicine

## 2011-01-04 ENCOUNTER — Telehealth: Payer: Self-pay | Admitting: *Deleted

## 2011-01-06 ENCOUNTER — Other Ambulatory Visit: Payer: Self-pay | Admitting: Family Medicine

## 2011-01-14 ENCOUNTER — Other Ambulatory Visit: Payer: Self-pay | Admitting: *Deleted

## 2011-01-14 MED ORDER — HYDROCHLOROTHIAZIDE 25 MG PO TABS: 25.0000 mg | ORAL_TABLET | Freq: Every day | ORAL | Status: DC

## 2011-01-18 ENCOUNTER — Ambulatory Visit: Payer: BC Managed Care – PPO | Admitting: Family Medicine

## 2011-01-18 ENCOUNTER — Ambulatory Visit: Payer: BC Managed Care – PPO

## 2011-01-18 ENCOUNTER — Ambulatory Visit (INDEPENDENT_AMBULATORY_CARE_PROVIDER_SITE_OTHER): Payer: Medicare Other | Admitting: *Deleted

## 2011-01-18 DIAGNOSIS — E539 Vitamin B deficiency, unspecified: Secondary | ICD-10-CM

## 2011-01-18 MED ORDER — CYANOCOBALAMIN 1000 MCG/ML IJ SOLN
1000.0000 ug | Freq: Once | INTRAMUSCULAR | Status: AC
  Administered 2011-01-18: 1000 ug via INTRAMUSCULAR

## 2011-02-06 ENCOUNTER — Encounter: Payer: Self-pay | Admitting: Family Medicine

## 2011-02-06 ENCOUNTER — Ambulatory Visit (INDEPENDENT_AMBULATORY_CARE_PROVIDER_SITE_OTHER): Payer: Medicare Other | Admitting: Family Medicine

## 2011-02-06 VITALS — BP 118/70 | Temp 98.5°F | Wt 181.0 lb

## 2011-02-06 DIAGNOSIS — G609 Hereditary and idiopathic neuropathy, unspecified: Secondary | ICD-10-CM

## 2011-02-06 DIAGNOSIS — G629 Polyneuropathy, unspecified: Secondary | ICD-10-CM

## 2011-02-06 DIAGNOSIS — E538 Deficiency of other specified B group vitamins: Secondary | ICD-10-CM

## 2011-02-06 MED ORDER — CYANOCOBALAMIN 1000 MCG/ML IJ SOLN
1000.0000 ug | Freq: Once | INTRAMUSCULAR | Status: AC
  Administered 2011-02-06: 1000 ug via INTRAMUSCULAR

## 2011-02-06 NOTE — Progress Notes (Signed)
  Subjective:    Patient ID: [name], male    DOB: 29-Oct-1932, 77 y.o.   MRN: [id number]  HPI Patient complains of some relatively chronic numbness and occasional burning in both feet. Several years ago placed by podiatrist on gabapentin and currently takes 300 mg twice daily. That may help minimally. Patient has known B12 deficiency and poor compliance with injections with recent B12 low at 198 in July. He is taking oral replacement in the past without much success. No anemia. No history of diabetes. Recent thyroid function normal. Denies any lower extremity weakness.   Review of Systems  Constitutional: Negative for fever, chills, appetite change and unexpected weight change.  Respiratory: Negative for cough and shortness of breath.   Cardiovascular: Negative for chest pain and leg swelling.  Neurological: Positive for numbness. Negative for weakness.       Objective:   Physical Exam  Constitutional: He appears well-developed and well-nourished.  Cardiovascular: Normal rate and regular rhythm.   Pulmonary/Chest: Effort normal and breath sounds normal. No respiratory distress. He has no wheezes. He has no rales.  Musculoskeletal: He exhibits no edema.       Both feet warm to touch with good capillary refill.  Neurological:       Normal sensation to touch. Deep tendon reflexes symmetric knee and ankle bilaterally. No focal weakness          Assessment & Plan:  #1 B12 deficiency #2 mild neuropathy in both feet possibly related to #1  Get back on B12 injections 1000 mcg intramuscular every other week for 4 doses then once monthly

## 2011-02-15 ENCOUNTER — Ambulatory Visit: Payer: BC Managed Care – PPO | Admitting: Family Medicine

## 2011-02-20 ENCOUNTER — Ambulatory Visit: Payer: BC Managed Care – PPO | Admitting: Family Medicine

## 2011-02-21 LAB — CBC
HCT: 32.5 — ABNORMAL LOW
HCT: 32.8 — ABNORMAL LOW
HCT: 33.3 — ABNORMAL LOW
HCT: 33.6 — ABNORMAL LOW
HCT: 35.4 — ABNORMAL LOW
Hemoglobin: 11.1 — ABNORMAL LOW
Hemoglobin: 11.2 — ABNORMAL LOW
Hemoglobin: 11.2 — ABNORMAL LOW
Hemoglobin: 11.3 — ABNORMAL LOW
Hemoglobin: 11.9 — ABNORMAL LOW
Hemoglobin: 12 — ABNORMAL LOW
MCHC: 33.4
MCHC: 33.7
MCHC: 33.9
MCHC: 34.2
MCV: 106 — ABNORMAL HIGH
MCV: 106.1 — ABNORMAL HIGH
MCV: 106.2 — ABNORMAL HIGH
MCV: 106.2 — ABNORMAL HIGH
Platelets: 127 — ABNORMAL LOW
Platelets: 137 — ABNORMAL LOW
Platelets: 146 — ABNORMAL LOW
Platelets: 159
Platelets: 159
RBC: 3.07 — ABNORMAL LOW
RBC: 3.07 — ABNORMAL LOW
RBC: 3.14 — ABNORMAL LOW
RBC: 3.16 — ABNORMAL LOW
RBC: 3.34 — ABNORMAL LOW
RDW: 15.2
RDW: 15.4
RDW: 15.4
RDW: 15.5
RDW: 15.5
RDW: 15.9 — ABNORMAL HIGH
WBC: 4.1
WBC: 4.4
WBC: 4.4
WBC: 5.3
WBC: 6.5

## 2011-02-21 LAB — PROTIME-INR
INR: 1.2
INR: 1.2
INR: 1.3
INR: 1.5
Prothrombin Time: 15.2
Prothrombin Time: 15.9 — ABNORMAL HIGH
Prothrombin Time: 16.9 — ABNORMAL HIGH
Prothrombin Time: 18.6 — ABNORMAL HIGH

## 2011-02-21 LAB — BASIC METABOLIC PANEL
Calcium: 8.7
Calcium: 9.2
GFR calc Af Amer: >60
GFR calc Af Amer: >60
GFR calc non Af Amer: >60
GFR calc non Af Amer: >60
Glucose, Bld: 93
Glucose, Bld: 95
Potassium: 3.6
Potassium: 4.3
Sodium: 142
Sodium: 142

## 2011-02-21 LAB — HEMOGLOBIN A1C
Hgb A1c MFr Bld: 6
Mean Plasma Glucose: 136

## 2011-02-21 LAB — HEPARIN LEVEL (UNFRACTIONATED)
Heparin Unfractionated: 0.19 — ABNORMAL LOW
Heparin Unfractionated: 0.32
Heparin Unfractionated: 0.58
Heparin Unfractionated: 0.62
Heparin Unfractionated: 0.65

## 2011-02-21 LAB — COMPREHENSIVE METABOLIC PANEL
ALT: 13
Alkaline Phosphatase: 50
CO2: 28
Calcium: 8.8
Chloride: 108
GFR calc non Af Amer: >60
Glucose, Bld: 107 — ABNORMAL HIGH
Potassium: 3.9
Sodium: 143
Total Bilirubin: 0.9

## 2011-02-21 LAB — DIFFERENTIAL
Basophils Absolute: 0
Basophils Relative: 0
Eosinophils Absolute: 0.1
Eosinophils Relative: 2
Lymphocytes Relative: 21
Lymphs Abs: 0.9
Monocytes Absolute: 0.4
Monocytes Relative: 9
Neutro Abs: 2.9
Neutrophils Relative %: 69

## 2011-02-21 LAB — CK TOTAL AND CKMB (NOT AT ARMC)
CK, MB: 1.6
Relative Index: 1.6
Total CK: 100

## 2011-02-21 LAB — CARDIAC PANEL(CRET KIN+CKTOT+MB+TROPI)
CK, MB: 1.3
CK, MB: 1.6
Relative Index: INVALID
Total CK: 78
Total CK: 91
Troponin I: <0.01
Troponin I: <0.01

## 2011-02-21 LAB — APTT
aPTT: 172 — ABNORMAL HIGH
aPTT: 35

## 2011-02-21 LAB — LIPID PANEL
HDL: 40
VLDL: 10

## 2011-04-19 ENCOUNTER — Other Ambulatory Visit: Payer: Self-pay

## 2011-04-19 MED ORDER — AMLODIPINE BESYLATE 5 MG PO TABS: 5.0000 mg | ORAL_TABLET | Freq: Every day | ORAL | Status: DC

## 2011-05-20 ENCOUNTER — Other Ambulatory Visit: Payer: Self-pay | Admitting: Internal Medicine

## 2011-06-14 ENCOUNTER — Ambulatory Visit (INDEPENDENT_AMBULATORY_CARE_PROVIDER_SITE_OTHER): Payer: Medicare Other | Admitting: Family Medicine

## 2011-06-14 ENCOUNTER — Encounter: Payer: Self-pay | Admitting: Family Medicine

## 2011-06-14 VITALS — BP 130/80 | Temp 97.8°F | Wt 184.0 lb

## 2011-06-14 DIAGNOSIS — I10 Essential (primary) hypertension: Secondary | ICD-10-CM

## 2011-06-14 DIAGNOSIS — R5381 Other malaise: Secondary | ICD-10-CM

## 2011-06-14 DIAGNOSIS — E538 Deficiency of other specified B group vitamins: Secondary | ICD-10-CM

## 2011-06-14 MED ORDER — CYANOCOBALAMIN 1000 MCG/ML IJ SOLN
1000.0000 ug | Freq: Once | INTRAMUSCULAR | Status: AC
  Administered 2011-06-14: 1000 ug via INTRAMUSCULAR

## 2011-06-14 NOTE — Progress Notes (Signed)
  Subjective:    Patient ID: [name], male    DOB: 05-19-1933, 78 y.o.   MRN: [id number]  HPI  Patient seen for medical followup. He has multiple medical problems including history of B12 deficiency, hyperlipidemia, peripheral vascular disease, hypertension, CAD, atrial fibrillation, GERD, history of gastric ulcer and chronic fatigue. Also history of BPH. Relatively stable symptomatically. He has some ongoing fatigue issues but denies any dizziness, headaches, abdominal pain, chest pain, dyspnea, or any recent bleeding complications. Remains on Coumadin followed in [facility].  History of B12 deficiency. Inconsistent with monthly injections. Wife states he has frequent "gurgling" sounds from his abdomen but he denies any abdominal pain, nausea, vomiting, appetite, or weight change. No recent stool changes.  Past Medical History  Diagnosis Date  . History of upper gastrointestinal hemorrhage   . Cerebrovascular accident   . Gastric polyp   . Dysphagia, unspecified   . Coronary artery disease   . Atrial fibrillation   . Hypertension   . Esophagitis   . GERD (gastroesophageal reflux disease)   . Erosive gastritis   . FH: colonic polyps   . Gastric ulcer   . Hiatal hernia   . Internal hemorrhoids    Past Surgical History  Procedure Date  . Cholecystectomy   . Asd repair [id number]  . Inguinal hernia repair 1963  . Partial small bowel resection 2005    ischemic?    reports that he quit smoking about 28 years ago. His smoking use included Cigarettes. He has a 20 pack-year smoking history. He does not have any smokeless tobacco history on file. His alcohol and drug histories not on file. family history is not on file. Allergies  Allergen Reactions  . Penicillins       Review of Systems  Constitutional: Negative for fatigue and unexpected weight change.  Eyes: Negative for visual disturbance.  Respiratory: Negative for cough, chest tightness and shortness of breath.    Cardiovascular: Negative for chest pain, palpitations and leg swelling.  Gastrointestinal: Negative for nausea, vomiting, abdominal pain, diarrhea, constipation, blood in stool and abdominal distention.  Genitourinary: Negative for dysuria.  Skin: Negative for rash.  Neurological: Negative for dizziness, syncope, weakness, light-headedness and headaches.  Hematological: Does not bruise/bleed easily.  Psychiatric/Behavioral: Negative for dysphoric mood.       Objective:   Physical Exam  Constitutional: He appears well-developed and well-nourished.  HENT:  Right Ear: External ear normal.  Left Ear: External ear normal.  Mouth/Throat: Oropharynx is clear and moist.  Neck: Neck supple. No thyromegaly present.  Cardiovascular: Normal rate and regular rhythm.   Pulmonary/Chest: Effort normal and breath sounds normal. No respiratory distress. He has no wheezes. He has no rales.  Musculoskeletal: He exhibits no edema.          Assessment & Plan:  #1 B12 deficiency. Get back on monthly cycle for B12 injections. Consider repeat level followup #2 history of atrial fibrillation. Rate controlled. Continue close monitoring at the [facility] clinic #3 fatigue possibly related to #1 #4 hypertension stable continue current medications  #5 BPH stable continue current meds

## 2011-07-24 ENCOUNTER — Ambulatory Visit (INDEPENDENT_AMBULATORY_CARE_PROVIDER_SITE_OTHER): Payer: Medicare Other | Admitting: Family Medicine

## 2011-07-24 DIAGNOSIS — E538 Deficiency of other specified B group vitamins: Secondary | ICD-10-CM

## 2011-07-24 MED ORDER — CYANOCOBALAMIN 1000 MCG/ML IJ SOLN
1000.0000 ug | Freq: Once | INTRAMUSCULAR | Status: AC
  Administered 2011-07-24: 1000 ug via INTRAMUSCULAR

## 2011-07-29 ENCOUNTER — Other Ambulatory Visit: Payer: Self-pay | Admitting: Family Medicine

## 2011-08-07 ENCOUNTER — Other Ambulatory Visit: Payer: Self-pay | Admitting: *Deleted

## 2011-08-07 ENCOUNTER — Telehealth: Payer: Self-pay | Admitting: Family Medicine

## 2011-08-07 MED ORDER — GABAPENTIN 300 MG PO CAPS: 300.0000 mg | ORAL_CAPSULE | Freq: Two times a day (BID) | ORAL | Status: DC

## 2011-08-07 NOTE — Telephone Encounter (Signed)
Pt did call back (see another phone note) and said his foot doctor, Dr Adam Phenix changed dosing, but would not fill med??  Pt requested gabapentin 300 mg, one tab BID be filled

## 2011-08-07 NOTE — Telephone Encounter (Signed)
Pharmacist said that they need clarification on gabapentin (NEURONTIN) 300 MG capsule dosage instructions.  Also need to know if Dr Caryl Never is register to rcv escripts? Pls call.

## 2011-08-07 NOTE — Telephone Encounter (Signed)
Patient stated that his foot Dr. Riley Churches his gabapentin to 300 mg 3xqd and would like his PCP to ok this and call it into his pharmacy. Please advise.

## 2011-08-07 NOTE — Telephone Encounter (Signed)
I spoke with pt, asking why his foot doctor, Dr Adam Phenix would fill this med.  Pt said no, not sure why??  Pt requested we fill the medicine the way our records indicate, gabapentin 300 mg, one tab BID.

## 2011-08-07 NOTE — Telephone Encounter (Signed)
Our records say Gabapentin 300 mg one tab BID Rx request from pharmacy Gabapentin 600 mg one tab TID? I left a message on pt VM to call and clarify as we have not filled this med yet, historical only

## 2011-08-07 NOTE — Telephone Encounter (Signed)
Conformed pt requested Gabapentin 300 mg, one tab BID. Will check into [facility].  They used to be able to send, upgrade was done to their system and for some reason they can no longer send

## 2011-08-15 ENCOUNTER — Telehealth: Payer: Self-pay

## 2011-08-15 MED ORDER — GABAPENTIN 600 MG PO TABS: 600.0000 mg | ORAL_TABLET | Freq: Three times a day (TID) | ORAL | Status: DC

## 2011-08-15 NOTE — Telephone Encounter (Signed)
Rhonda from [facility] called and stated that the pt had bought his rx back and told her that it was for the incorrect mg and incorrect times a day.  Bjorn Loser verified that the last rx received was for gabapentin 600 mg tid #90.  Per Dr. Caryl Never rx sent to pharmacy for gabapentin 600 mg tid #90.

## 2011-08-21 ENCOUNTER — Ambulatory Visit (INDEPENDENT_AMBULATORY_CARE_PROVIDER_SITE_OTHER): Payer: Medicare Other | Admitting: Family Medicine

## 2011-08-21 DIAGNOSIS — E538 Deficiency of other specified B group vitamins: Secondary | ICD-10-CM

## 2011-08-21 MED ORDER — CYANOCOBALAMIN 1000 MCG/ML IJ SOLN
1000.0000 ug | Freq: Once | INTRAMUSCULAR | Status: AC
  Administered 2011-08-21: 1000 ug via INTRAMUSCULAR

## 2011-08-24 ENCOUNTER — Other Ambulatory Visit: Payer: Self-pay | Admitting: Family Medicine

## 2011-09-17 ENCOUNTER — Ambulatory Visit (INDEPENDENT_AMBULATORY_CARE_PROVIDER_SITE_OTHER): Payer: Medicare Other | Admitting: Family Medicine

## 2011-09-17 DIAGNOSIS — E538 Deficiency of other specified B group vitamins: Secondary | ICD-10-CM

## 2011-09-17 MED ORDER — CYANOCOBALAMIN 1000 MCG/ML IJ SOLN
1000.0000 ug | Freq: Once | INTRAMUSCULAR | Status: AC
  Administered 2011-09-17: 1000 ug via INTRAMUSCULAR

## 2011-09-18 ENCOUNTER — Ambulatory Visit: Payer: BC Managed Care – PPO | Admitting: Family Medicine

## 2011-10-15 ENCOUNTER — Ambulatory Visit (INDEPENDENT_AMBULATORY_CARE_PROVIDER_SITE_OTHER): Payer: Medicare Other | Admitting: Family Medicine

## 2011-10-15 DIAGNOSIS — E538 Deficiency of other specified B group vitamins: Secondary | ICD-10-CM

## 2011-10-15 MED ORDER — CYANOCOBALAMIN 1000 MCG/ML IJ SOLN
1000.0000 ug | Freq: Once | INTRAMUSCULAR | Status: AC
  Administered 2011-10-15: 1000 ug via INTRAMUSCULAR

## 2011-10-16 ENCOUNTER — Ambulatory Visit: Payer: BC Managed Care – PPO | Admitting: Family Medicine

## 2011-11-20 ENCOUNTER — Ambulatory Visit (INDEPENDENT_AMBULATORY_CARE_PROVIDER_SITE_OTHER): Payer: Medicare Other | Admitting: Family Medicine

## 2011-11-20 DIAGNOSIS — E538 Deficiency of other specified B group vitamins: Secondary | ICD-10-CM

## 2011-11-20 MED ORDER — CYANOCOBALAMIN 1000 MCG/ML IJ SOLN
1000.0000 ug | Freq: Once | INTRAMUSCULAR | Status: AC
  Administered 2011-11-20: 1000 ug via INTRAMUSCULAR

## 2011-12-16 ENCOUNTER — Other Ambulatory Visit: Payer: Self-pay | Admitting: *Deleted

## 2011-12-16 MED ORDER — AMLODIPINE BESYLATE 5 MG PO TABS: 5.0000 mg | ORAL_TABLET | Freq: Every day | ORAL | Status: DC

## 2011-12-16 MED ORDER — ENALAPRIL MALEATE 10 MG PO TABS: 10.0000 mg | ORAL_TABLET | Freq: Two times a day (BID) | ORAL | Status: DC

## 2011-12-18 ENCOUNTER — Ambulatory Visit (INDEPENDENT_AMBULATORY_CARE_PROVIDER_SITE_OTHER): Payer: Medicare Other | Admitting: Family Medicine

## 2011-12-18 ENCOUNTER — Ambulatory Visit: Payer: BC Managed Care – PPO | Admitting: Family Medicine

## 2011-12-18 ENCOUNTER — Encounter: Payer: Self-pay | Admitting: Family Medicine

## 2011-12-18 VITALS — BP 110/70 | Temp 98.9°F | Wt 178.0 lb

## 2011-12-18 DIAGNOSIS — I10 Essential (primary) hypertension: Secondary | ICD-10-CM

## 2011-12-18 DIAGNOSIS — E785 Hyperlipidemia, unspecified: Secondary | ICD-10-CM

## 2011-12-18 DIAGNOSIS — I2510 Atherosclerotic heart disease of native coronary artery without angina pectoris: Secondary | ICD-10-CM

## 2011-12-18 DIAGNOSIS — E538 Deficiency of other specified B group vitamins: Secondary | ICD-10-CM

## 2011-12-18 LAB — LIPID PANEL
HDL: 53.4 mg/dL (ref >39.00)
Total CHOL/HDL Ratio: 2
Triglycerides: 129 mg/dL (ref 0.0–149.0)
VLDL: 25.8 mg/dL (ref 0.0–40.0)

## 2011-12-18 LAB — VITAMIN B12: Vitamin B-12: >1500 pg/mL — ABNORMAL HIGH (ref 211–911)

## 2011-12-18 LAB — HEPATIC FUNCTION PANEL
ALT: 15 U/L (ref 0–53)
AST: 20 U/L (ref 0–37)
Albumin: 3.7 g/dL (ref 3.5–5.2)

## 2011-12-18 LAB — BASIC METABOLIC PANEL
CO2: 31 mEq/L (ref 19–32)
Calcium: 8.9 mg/dL (ref 8.4–10.5)
GFR: 76.91 mL/min (ref >60.00)
Potassium: 3.4 mEq/L — ABNORMAL LOW (ref 3.5–5.1)
Sodium: 141 mEq/L (ref 135–145)

## 2011-12-18 MED ORDER — CYANOCOBALAMIN 1000 MCG/ML IJ SOLN
1000.0000 ug | Freq: Once | INTRAMUSCULAR | Status: AC
  Administered 2011-12-18: 1000 ug via INTRAMUSCULAR

## 2011-12-18 NOTE — Progress Notes (Signed)
  Subjective:    Patient ID: [name], male    DOB: 03-10-1933, 78 y.o.   MRN: [id number]  HPI  Medical followup. Patient has history of CAD, peripheral vascular disease, hypertension, atrial fibrillation, hyperlipidemia, GERD, and B12 deficiency. He has been compliant with B12 injections recently. Had low levels last fall. Here for repeat levels today.  Chronic fatigue which is unchanged. No chest pains. No dyspnea. Compliant with all medications. No dizziness.  No recent chest pain.  Past Medical History  Diagnosis Date  . History of upper gastrointestinal hemorrhage   . Cerebrovascular accident   . Gastric polyp   . Dysphagia, unspecified   . Coronary artery disease   . Atrial fibrillation   . Hypertension   . Esophagitis   . GERD (gastroesophageal reflux disease)   . Erosive gastritis   . FH: colonic polyps   . Gastric ulcer   . Hiatal hernia   . Internal hemorrhoids    Past Surgical History  Procedure Date  . Cholecystectomy   . Asd repair [id number]  . Inguinal hernia repair 1963  . Partial small bowel resection 2005    ischemic?    reports that he quit smoking about 28 years ago. His smoking use included Cigarettes. He has a 20 pack-year smoking history. He does not have any smokeless tobacco history on file. His alcohol and drug histories not on file. family history is not on file. Allergies  Allergen Reactions  . Penicillins     Review of Systems  Constitutional: Negative for fatigue.  Eyes: Negative for visual disturbance.  Respiratory: Negative for cough, chest tightness and shortness of breath.   Cardiovascular: Negative for chest pain, palpitations and leg swelling.  Neurological: Negative for dizziness, syncope, weakness, light-headedness and headaches.       Objective:   Physical Exam  Constitutional: He is oriented to person, place, and time. He appears well-developed and well-nourished.  Neck: Neck supple. No thyromegaly present.  Cardiovascular:  Normal rate.        Irregular rhythm but rate controlled  Pulmonary/Chest: Effort normal and breath sounds normal. No respiratory distress. He has no wheezes. He has no rales.  Musculoskeletal: He exhibits no edema.  Lymphadenopathy:    He has no cervical adenopathy.  Neurological: He is alert and oriented to person, place, and time. No cranial nerve deficit.          Assessment & Plan:  #1 B12 deficiency. Repeat level. Injection given today.  #2 hypertension stable at goal. Continue current medications. Check basic metabolic panel with diuretic therapy #3 hyperlipidemia. Check lipid and hepatic panel. #4 history of chronic atrial fibrillation. Rate controlled. He is followed in Coumadin clinic elsewhere  #5 history of BPH. Symptomatically stable with current medications

## 2011-12-19 NOTE — Progress Notes (Signed)
Quick Note:  Pt informed, high K diet and labs mailed to home ______

## 2012-01-08 ENCOUNTER — Other Ambulatory Visit: Payer: Self-pay | Admitting: Family Medicine

## 2012-01-15 ENCOUNTER — Other Ambulatory Visit: Payer: Self-pay | Admitting: Family Medicine

## 2012-01-17 ENCOUNTER — Other Ambulatory Visit: Payer: Self-pay

## 2012-01-17 ENCOUNTER — Other Ambulatory Visit: Payer: Self-pay | Admitting: *Deleted

## 2012-01-17 MED ORDER — AMLODIPINE BESYLATE 5 MG PO TABS: 5.0000 mg | ORAL_TABLET | Freq: Every day | ORAL | Status: DC

## 2012-01-17 MED ORDER — ENALAPRIL MALEATE 10 MG PO TABS: 10.0000 mg | ORAL_TABLET | Freq: Two times a day (BID) | ORAL | Status: DC

## 2012-01-22 ENCOUNTER — Ambulatory Visit (INDEPENDENT_AMBULATORY_CARE_PROVIDER_SITE_OTHER): Payer: Medicare Other | Admitting: Family Medicine

## 2012-01-22 ENCOUNTER — Encounter: Payer: Self-pay | Admitting: Family Medicine

## 2012-01-22 DIAGNOSIS — E538 Deficiency of other specified B group vitamins: Secondary | ICD-10-CM

## 2012-01-22 MED ORDER — CYANOCOBALAMIN 1000 MCG/ML IJ SOLN
1000.0000 ug | Freq: Once | INTRAMUSCULAR | Status: AC
  Administered 2012-01-22: 1000 ug via INTRAMUSCULAR

## 2012-01-31 ENCOUNTER — Other Ambulatory Visit: Payer: Self-pay | Admitting: Family Medicine

## 2012-02-04 ENCOUNTER — Telehealth: Payer: Self-pay | Admitting: Family Medicine

## 2012-02-04 MED ORDER — HYDROCHLOROTHIAZIDE 25 MG PO TABS: ORAL_TABLET | ORAL | Status: DC

## 2012-02-04 MED ORDER — AMLODIPINE BESYLATE 5 MG PO TABS: 5.0000 mg | ORAL_TABLET | Freq: Every day | ORAL | Status: DC

## 2012-02-04 MED ORDER — ENALAPRIL MALEATE 5 MG PO TABS: 5.0000 mg | ORAL_TABLET | Freq: Every day | ORAL | Status: DC

## 2012-02-04 NOTE — Telephone Encounter (Signed)
Patient came in today, he needs a refill of Enalapril Maleate and his other blood pressure medication.

## 2012-02-05 ENCOUNTER — Telehealth: Payer: Self-pay | Admitting: *Deleted

## 2012-02-05 ENCOUNTER — Ambulatory Visit: Payer: BC Managed Care – PPO | Admitting: Physician Assistant

## 2012-02-05 MED ORDER — ENALAPRIL MALEATE 10 MG PO TABS: 10.0000 mg | ORAL_TABLET | Freq: Two times a day (BID) | ORAL | Status: DC

## 2012-02-05 NOTE — Telephone Encounter (Signed)
Received PC from Tammy, Pharmacist at pt pharmacy re: Vasotec.  Dr Loman Brooklyn last filled on 01-17-12 Vasotec 10 mg BID.  We refilled Vasotec 5 mg one tab daily yesterday because that is what is in our records, and a refill was requested.  Pt informed pharmacist he takes 10mg  BID.  Pt told pharmacist Dr Caryl Never was going to begin filling that med.  Please advise

## 2012-02-05 NOTE — Telephone Encounter (Signed)
Confirm what he has been getting at pharmacy.  We have down 5 mg so if we cannot confirm otherwise we should stay at 5 mg.

## 2012-02-05 NOTE — Telephone Encounter (Signed)
Per pharmacist Tammy, Dr Ty Hilts ordered Vasotec 10 mg BID since 10-19-2010 and has been on it ever since.  Will changed med to Vasotec 10 mg, one tab bid, #60 with 2 refills.  Pt encouraged to follow-up with Dr Caryl Never.

## 2012-02-18 ENCOUNTER — Other Ambulatory Visit: Payer: Self-pay | Admitting: Family Medicine

## 2012-02-19 ENCOUNTER — Ambulatory Visit (INDEPENDENT_AMBULATORY_CARE_PROVIDER_SITE_OTHER): Payer: Medicare Other | Admitting: Family Medicine

## 2012-02-19 ENCOUNTER — Encounter: Payer: Self-pay | Admitting: Family Medicine

## 2012-02-19 ENCOUNTER — Ambulatory Visit: Payer: BC Managed Care – PPO | Admitting: Family Medicine

## 2012-02-19 VITALS — BP 140/60 | Temp 97.6°F | Wt 182.0 lb

## 2012-02-19 DIAGNOSIS — Z23 Encounter for immunization: Secondary | ICD-10-CM

## 2012-02-19 DIAGNOSIS — E876 Hypokalemia: Secondary | ICD-10-CM

## 2012-02-19 DIAGNOSIS — I4891 Unspecified atrial fibrillation: Secondary | ICD-10-CM

## 2012-02-19 DIAGNOSIS — I2510 Atherosclerotic heart disease of native coronary artery without angina pectoris: Secondary | ICD-10-CM

## 2012-02-19 DIAGNOSIS — E538 Deficiency of other specified B group vitamins: Secondary | ICD-10-CM

## 2012-02-19 LAB — BASIC METABOLIC PANEL
Calcium: 8.7 mg/dL (ref 8.4–10.5)
Creatinine, Ser: 1 mg/dL (ref 0.4–1.5)
GFR: 73.47 mL/min (ref >60.00)
Sodium: 142 mEq/L (ref 135–145)

## 2012-02-19 MED ORDER — CYANOCOBALAMIN 1000 MCG/ML IJ SOLN
1000.0000 ug | Freq: Once | INTRAMUSCULAR | Status: AC
  Administered 2012-02-19: 1000 ug via INTRAMUSCULAR

## 2012-02-19 NOTE — Patient Instructions (Addendum)
We can go to every other month with B12 injections.

## 2012-02-19 NOTE — Progress Notes (Signed)
  Subjective:    Patient ID: [name], male    DOB: 1932-09-14, 78 y.o.   MRN: [id number]  HPI  Patient history of CAD, hypertension, atrial fibrillation, hyperlipidemia, GERD, history of gastric ulcer, B12 deficiency.  On recent labs had mildly low potassium 3.4. He takes HCTZ was also on ACE inhibitor. No recent muscle cramps. No chest pains. No dizziness. He was on Coumadin for atrial fibrillation. INR followed through [facility]. No bleeding complications.  Generally feels well. He has some chronic fatigue which is unchanged. No other specific complaints. Recent B12 level over 1500. He has been getting monthly injections and we have suggested that he could probably go to every other month. We have given option of oral replacement daily and he prefers injection  Past Medical History  Diagnosis Date  . History of upper gastrointestinal hemorrhage   . Cerebrovascular accident   . Gastric polyp   . Dysphagia, unspecified   . Coronary artery disease   . Atrial fibrillation   . Hypertension   . Esophagitis   . GERD (gastroesophageal reflux disease)   . Erosive gastritis   . FH: colonic polyps   . Gastric ulcer   . Hiatal hernia   . Internal hemorrhoids    Past Surgical History  Procedure Date  . Cholecystectomy   . Asd repair [id number]  . Inguinal hernia repair 1963  . Partial small bowel resection 2005    ischemic?    reports that he quit smoking about 28 years ago. His smoking use included Cigarettes. He has a 20 pack-year smoking history. He does not have any smokeless tobacco history on file. His alcohol and drug histories not on file. family history is not on file. Allergies  Allergen Reactions  . Penicillins       Review of Systems  Constitutional: Negative for fatigue and unexpected weight change.  Eyes: Negative for visual disturbance.  Respiratory: Negative for cough, chest tightness and shortness of breath.   Cardiovascular: Negative for chest pain,  palpitations and leg swelling.  Neurological: Negative for dizziness, syncope, weakness, light-headedness and headaches.  Hematological: Does not bruise/bleed easily.       Objective:   Physical Exam  Constitutional: He appears well-developed and well-nourished.  Neck: Neck supple. No thyromegaly present.  Cardiovascular:       Irregular rhythm rate controlled  Pulmonary/Chest: Effort normal and breath sounds normal. No respiratory distress. He has no wheezes. He has no rales.  Musculoskeletal: He exhibits no edema.  Neurological: He is alert.          Assessment & Plan:  #1 recent mild hypokalemia. Recheck basic metabolic panel  #2 history of CAD. Patient has not seen cardiologist in apparently about 2 years. He is encouraged to reestablish care with them since his previous cardiologist has retired  #3 health maintenance. Needs flu vaccine. He'll be given today #4 B12 deficiency. Injection given today. We'll go to every other month injection at this point-recent level > 1,500.

## 2012-02-20 ENCOUNTER — Other Ambulatory Visit: Payer: Self-pay | Admitting: Family Medicine

## 2012-02-20 DIAGNOSIS — I10 Essential (primary) hypertension: Secondary | ICD-10-CM

## 2012-02-20 DIAGNOSIS — R739 Hyperglycemia, unspecified: Secondary | ICD-10-CM

## 2012-02-20 NOTE — Progress Notes (Signed)
Quick Note:  Called and spoke with pt and pt is aware. ______ 

## 2012-03-02 ENCOUNTER — Encounter: Payer: Self-pay | Admitting: Internal Medicine

## 2012-03-02 ENCOUNTER — Ambulatory Visit (INDEPENDENT_AMBULATORY_CARE_PROVIDER_SITE_OTHER): Payer: Medicare Other | Admitting: Internal Medicine

## 2012-03-02 VITALS — BP 117/72 | HR 78 | Ht 71.0 in | Wt 178.0 lb

## 2012-03-02 DIAGNOSIS — I10 Essential (primary) hypertension: Secondary | ICD-10-CM

## 2012-03-02 DIAGNOSIS — E785 Hyperlipidemia, unspecified: Secondary | ICD-10-CM

## 2012-03-02 DIAGNOSIS — I4891 Unspecified atrial fibrillation: Secondary | ICD-10-CM

## 2012-03-02 NOTE — Assessment & Plan Note (Signed)
Stable No change required today  

## 2012-03-02 NOTE — Progress Notes (Signed)
PCP:  Kristian Covey, MD Primary Cardiologist:  Previously Dr Juanda Chance  The patient presents today for routine cardiology followup.  He has afib, prior ASD repair, and permanent atrial fibrillation.  Cath 2009 revealed nonobstructive CAD.  He was previously followed by Dr Juanda Chance but not seen in our office since 2011.  Since last being seen in our clinic, the patient reports doing very well. He remains reasonably active.  He has dyspnea on incline but does reasonably well on level surface.  His primary concern is with peripheral neuropathy.   Today, he denies symptoms of palpitations, chest pain, shortness of breath, orthopnea, PND, lower extremity edema, dizziness, presyncope, syncope, or neurologic sequela.  The patient feels that he is tolerating medications without difficulties and is otherwise without complaint today.   Past Medical History  Diagnosis Date  . History of upper gastrointestinal hemorrhage   . Cerebrovascular accident   . Gastric polyp   . Dysphagia, unspecified   . Coronary artery disease     nonobstructive by cath 2009  . Permanent atrial fibrillation   . Hypertension   . Esophagitis   . GERD (gastroesophageal reflux disease)   . Erosive gastritis   . FH: colonic polyps   . Gastric ulcer   . Hiatal hernia   . Internal hemorrhoids    Past Surgical History  Procedure Date  . Cholecystectomy   . Asd repair [id number]  . Inguinal hernia repair 1963  . Partial small bowel resection 2005    ischemic?    Current Outpatient Prescriptions  Medication Sig Dispense Refill  . amLODipine (NORVASC) 5 MG tablet Take 1 tablet (5 mg total) by mouth daily.  30 tablet  5  . AVODART 0.5 MG capsule Take 0.5 mg by mouth daily.       . cyanocobalamin (,VITAMIN B-12,) 1000 MCG/ML injection Inject 1,000 mcg into the muscle once.        . doxazosin (CARDURA) 4 MG tablet       . enalapril (VASOTEC) 10 MG tablet Take 1 tablet (10 mg total) by mouth 2 (two) times daily.  60 tablet  2    . gabapentin (NEURONTIN) 600 MG tablet Take 1 tablet (600 mg total) by mouth 3 (three) times daily.  90 tablet  6  . hydrochlorothiazide (HYDRODIURIL) 25 MG tablet Take 1/2 tab daily  30 tablet  3  . LOTRISONE cream APPLY TO AFFECTED AREAS TWICE A DAY  45 g  1  . PRILOSEC 20 MG capsule TAKE (2) CAPSULES DAILY.  180 each  3  . warfarin (COUMADIN) 5 MG tablet Take 5 mg by mouth. Use as directed       . ZOCOR 20 MG tablet TAKE ONE TABLET AT BEDTIME  30 each  11    Allergies  Allergen Reactions  . Penicillins     History   Social History  . Marital Status: Married    Spouse Name: N/A    Number of Children: N/A  . Years of Education: N/A   Occupational History  . Not on file.   Social History Main Topics  . Smoking status: Former Smoker -- 1.0 packs/day for 20 years    Types: Cigarettes    Quit date: 06/13/1983  . Smokeless tobacco: Not on file  . Alcohol Use: No  . Drug Use: No  . Sexually Active: Not on file   Other Topics Concern  . Not on file   Social History Narrative   Lives in [place]  Physical Exam: Filed Vitals:   03/02/12 1403  BP: 117/72  Pulse: 78  Height: 5\' 11"  (1.803 m)  Weight: 178 lb (80.74 kg)  SpO2: 95%    GEN- The patient is well appearing, alert and oriented x 3 today.   Head- normocephalic, atraumatic Eyes-  L eye ptosis Ears- hearing intact Oropharynx- clear Lungs- Clear to ausculation bilaterally, normal work of breathing Heart- irregular rate and rhythm, no murmurs, rubs or gallops, PMI not laterally displaced GI- soft, NT, ND, + BS Extremities- no clubbing, cyanosis, or edema MS- diffuse muscle atrophy Skin- no rash or lesion Psych- euthymic mood, full affect Neuro- strength and sensation are intact, L eye ptosis  EKG today reveals afib, V rate 83 bpm, Bifascicular block is unchanged from 2010  Assessment and Plan:

## 2012-03-02 NOTE — Patient Instructions (Addendum)
Your physician wants you to follow-up in: 12 months with Dr Allred You will receive a reminder letter in the mail two months in advance. If you don't receive a letter, please call our office to schedule the follow-up appointment.   Your physician has requested that you have an echocardiogram. Echocardiography is a painless test that uses sound waves to create images of your heart. It provides your doctor with information about the size and shape of your heart and how well your heart's chambers and valves are working. This procedure takes approximately one hour. There are no restrictions for this procedure.     

## 2012-03-02 NOTE — Assessment & Plan Note (Signed)
He has permanent rate controlled afib He is appropriately anticoagulated with coumadin and has INRs followed at [facility].  We will obtain an echo to evaluate for reduced LV function or any structural changes related to afib.

## 2012-03-02 NOTE — Assessment & Plan Note (Signed)
Lipids 7/13 reviewed and normal No changes

## 2012-03-09 ENCOUNTER — Ambulatory Visit (HOSPITAL_COMMUNITY): Payer: Medicare Other | Attending: Internal Medicine | Admitting: Radiology

## 2012-03-09 DIAGNOSIS — I4891 Unspecified atrial fibrillation: Secondary | ICD-10-CM | POA: Insufficient documentation

## 2012-03-09 DIAGNOSIS — I059 Rheumatic mitral valve disease, unspecified: Secondary | ICD-10-CM | POA: Insufficient documentation

## 2012-03-09 DIAGNOSIS — I369 Nonrheumatic tricuspid valve disorder, unspecified: Secondary | ICD-10-CM | POA: Insufficient documentation

## 2012-03-09 DIAGNOSIS — Q2111 Secundum atrial septal defect: Secondary | ICD-10-CM | POA: Insufficient documentation

## 2012-03-09 DIAGNOSIS — I10 Essential (primary) hypertension: Secondary | ICD-10-CM | POA: Insufficient documentation

## 2012-03-09 DIAGNOSIS — Q211 Atrial septal defect: Secondary | ICD-10-CM | POA: Insufficient documentation

## 2012-03-09 NOTE — Progress Notes (Signed)
Echocardiogram performed.  

## 2012-03-17 ENCOUNTER — Other Ambulatory Visit (INDEPENDENT_AMBULATORY_CARE_PROVIDER_SITE_OTHER): Payer: Medicare Other

## 2012-03-17 DIAGNOSIS — R739 Hyperglycemia, unspecified: Secondary | ICD-10-CM

## 2012-03-17 DIAGNOSIS — R7309 Other abnormal glucose: Secondary | ICD-10-CM

## 2012-03-17 LAB — BASIC METABOLIC PANEL
GFR: 85.7 mL/min (ref >60.00)
Potassium: 3.5 mEq/L (ref 3.5–5.1)
Sodium: 140 mEq/L (ref 135–145)

## 2012-03-18 ENCOUNTER — Ambulatory Visit: Payer: BC Managed Care – PPO | Admitting: Family Medicine

## 2012-03-20 NOTE — Progress Notes (Signed)
Quick Note:  Pt informed on VM ______ 

## 2012-03-24 ENCOUNTER — Ambulatory Visit (INDEPENDENT_AMBULATORY_CARE_PROVIDER_SITE_OTHER): Payer: Medicare Other | Admitting: Family Medicine

## 2012-03-24 ENCOUNTER — Encounter: Payer: Self-pay | Admitting: Family Medicine

## 2012-03-24 VITALS — BP 120/60 | Temp 97.8°F | Wt 183.0 lb

## 2012-03-24 DIAGNOSIS — I4891 Unspecified atrial fibrillation: Secondary | ICD-10-CM

## 2012-03-24 DIAGNOSIS — E538 Deficiency of other specified B group vitamins: Secondary | ICD-10-CM

## 2012-03-24 DIAGNOSIS — G609 Hereditary and idiopathic neuropathy, unspecified: Secondary | ICD-10-CM

## 2012-03-24 DIAGNOSIS — R5383 Other fatigue: Secondary | ICD-10-CM

## 2012-03-24 DIAGNOSIS — R5381 Other malaise: Secondary | ICD-10-CM

## 2012-03-24 DIAGNOSIS — I10 Essential (primary) hypertension: Secondary | ICD-10-CM

## 2012-03-24 NOTE — Progress Notes (Signed)
  Subjective:    Patient ID: [name], male    DOB: Feb 16, 1933, 78 y.o.   MRN: [id number]  HPI  Medical followup. Patient had recent nonfasting basic metabolic panel with elevated blood sugar. Came back for repeat fasting labs with blood sugar normal range. No history of diabetes. No symptoms of hyperglycemia. History of chronic atrial fibrillation on Coumadin. Rate controlled. Recent echocardiogram per cardiology no acute findings. Symptomatically stable. Still has some chronic fatigue which is unchanged. No observed apnea episodes. Neuropathy symptoms stable on gabapentin.  Compliant with medications and no side effects.  B12 deficiency. Decided to go to every other month B12 injection. Patient has already had flu vaccine.  Past Medical History  Diagnosis Date  . History of upper gastrointestinal hemorrhage   . Cerebrovascular accident   . Gastric polyp   . Dysphagia, unspecified   . Coronary artery disease     nonobstructive by cath 2009  . Permanent atrial fibrillation   . Hypertension   . Esophagitis   . GERD (gastroesophageal reflux disease)   . Erosive gastritis   . FH: colonic polyps   . Gastric ulcer   . Hiatal hernia   . Internal hemorrhoids    Past Surgical History  Procedure Date  . Cholecystectomy   . Asd repair [id number]  . Inguinal hernia repair 1963  . Partial small bowel resection 2005    ischemic?    reports that he quit smoking about 28 years ago. His smoking use included Cigarettes. He has a 20 pack-year smoking history. He does not have any smokeless tobacco history on file. He reports that he does not drink alcohol or use illicit drugs. family history is not on file. Allergies  Allergen Reactions  . Penicillins     hives       Review of Systems  Constitutional: Negative for fatigue.  Eyes: Negative for visual disturbance.  Respiratory: Negative for cough, chest tightness and shortness of breath.   Cardiovascular: Negative for chest pain,  palpitations and leg swelling.  Neurological: Negative for dizziness, syncope, weakness, light-headedness and headaches.       Objective:   Physical Exam  Constitutional: He appears well-developed and well-nourished.  Neck: Neck supple.  Cardiovascular: Normal rate.   Pulmonary/Chest: Effort normal and breath sounds normal. No respiratory distress. He has no wheezes. He has no rales.  Musculoskeletal: He exhibits no edema.  Skin: No rash noted.  Psychiatric: He has a normal mood and affect. His behavior is normal.          Assessment & Plan:  #1 chronic atrial fibrillation. Rate controlled. He'll continue to get protimes monitored through [facility]  #2 history of B12 deficiency. Adequately replaced. Reduce B12 injections to every other month. #3 recent elevated nonfasting blood sugar. Repeat fasting normal range.  #4 chronic fatigue. Previous thyroid function testing normal. Suspect some of this is due to inactivity and deconditioning. He is encouraged to walk more as tolerated

## 2012-03-24 NOTE — Patient Instructions (Signed)
We will go to every other month with B12 injections with next in November.

## 2012-04-22 ENCOUNTER — Ambulatory Visit (INDEPENDENT_AMBULATORY_CARE_PROVIDER_SITE_OTHER): Payer: Medicare Other | Admitting: Family Medicine

## 2012-04-22 DIAGNOSIS — E538 Deficiency of other specified B group vitamins: Secondary | ICD-10-CM

## 2012-04-22 MED ORDER — CYANOCOBALAMIN 1000 MCG/ML IJ SOLN
1000.0000 ug | Freq: Once | INTRAMUSCULAR | Status: AC
  Administered 2012-04-22: 1000 ug via INTRAMUSCULAR

## 2012-05-05 ENCOUNTER — Telehealth: Payer: Self-pay | Admitting: Family Medicine

## 2012-05-05 NOTE — Telephone Encounter (Signed)
[name], Please see note below. I cannot tell if they sent him to ED, or if they are waiting for Dr. Caryl Never to decide. Please advise.

## 2012-05-05 NOTE — Telephone Encounter (Signed)
If he has any shortness of breath or dizziness should go to ER.  If he has chest wall tenderness without other symptoms, we could probably work him in to be seen here next couple of days.  Also make sure not head injury with the fall.  If head injury or any headaches go to ER

## 2012-05-05 NOTE — Telephone Encounter (Signed)
Patient Information:  Caller Name: Lorin Mercy  Phone: [phone]  Patient: [name], [name]  Gender: Male  DOB: 1932-08-13  Age: 78 Years  PCP: Evelena Peat (Family Practice)   Symptoms  Reason For Call & Symptoms: fall in bathtub 05/04/12, pain on right side  Reviewed Health History In EMR: Yes  Reviewed Medications In EMR: Yes  Reviewed Allergies In EMR: Yes  Reviewed Surgeries / Procedures: Yes  Date of Onset of Symptoms: 05/04/2012  Guideline(s) Used:  Abdominal Pain - Upper  Disposition Per Guideline:   Go to ED Now (or to Office with PCP Approval)  Reason For Disposition Reached:   Patient sounds very sick or weak to the triager  Office Follow Up:  Does the office need to follow up with this patient?: Yes  Instructions For The Office: Please call, ED disposition, no appointments in [facility]  RN Note:  pain in right side of chest after fall 05/04/12.

## 2012-05-05 NOTE — Telephone Encounter (Signed)
VM left on personally identified VM to call back to further discuss his sx, or perhaps he has been taken to the ER?

## 2012-05-06 ENCOUNTER — Emergency Department (HOSPITAL_COMMUNITY): Payer: Medicare Other

## 2012-05-06 ENCOUNTER — Encounter (HOSPITAL_COMMUNITY): Payer: Self-pay

## 2012-05-06 ENCOUNTER — Ambulatory Visit (INDEPENDENT_AMBULATORY_CARE_PROVIDER_SITE_OTHER): Payer: Medicare Other | Admitting: Family Medicine

## 2012-05-06 ENCOUNTER — Inpatient Hospital Stay (HOSPITAL_COMMUNITY)
Admission: EM | Admit: 2012-05-06 | Discharge: 2012-05-15 | DRG: 193 | Disposition: A | Discharge status: Home Health | Payer: Medicare Other | Attending: Internal Medicine | Admitting: Internal Medicine

## 2012-05-06 ENCOUNTER — Encounter: Payer: Self-pay | Admitting: Family Medicine

## 2012-05-06 VITALS — BP 150/80 | HR 110 | Temp 99.5°F | Resp 20 | Wt 171.0 lb

## 2012-05-06 DIAGNOSIS — Z7901 Long term (current) use of anticoagulants: Secondary | ICD-10-CM

## 2012-05-06 DIAGNOSIS — R0682 Tachypnea, not elsewhere classified: Secondary | ICD-10-CM

## 2012-05-06 DIAGNOSIS — Z79899 Other long term (current) drug therapy: Secondary | ICD-10-CM

## 2012-05-06 DIAGNOSIS — K922 Gastrointestinal hemorrhage, unspecified: Secondary | ICD-10-CM

## 2012-05-06 DIAGNOSIS — I2510 Atherosclerotic heart disease of native coronary artery without angina pectoris: Secondary | ICD-10-CM

## 2012-05-06 DIAGNOSIS — K2971 Gastritis, unspecified, with bleeding: Secondary | ICD-10-CM

## 2012-05-06 DIAGNOSIS — G609 Hereditary and idiopathic neuropathy, unspecified: Secondary | ICD-10-CM

## 2012-05-06 DIAGNOSIS — J9801 Acute bronchospasm: Secondary | ICD-10-CM

## 2012-05-06 DIAGNOSIS — J411 Mucopurulent chronic bronchitis: Secondary | ICD-10-CM

## 2012-05-06 DIAGNOSIS — K449 Diaphragmatic hernia without obstruction or gangrene: Secondary | ICD-10-CM

## 2012-05-06 DIAGNOSIS — Z8673 Personal history of transient ischemic attack (TIA), and cerebral infarction without residual deficits: Secondary | ICD-10-CM

## 2012-05-06 DIAGNOSIS — Z87891 Personal history of nicotine dependence: Secondary | ICD-10-CM

## 2012-05-06 DIAGNOSIS — K219 Gastro-esophageal reflux disease without esophagitis: Secondary | ICD-10-CM

## 2012-05-06 DIAGNOSIS — I4891 Unspecified atrial fibrillation: Secondary | ICD-10-CM

## 2012-05-06 DIAGNOSIS — K222 Esophageal obstruction: Secondary | ICD-10-CM | POA: Diagnosis present

## 2012-05-06 DIAGNOSIS — R0789 Other chest pain: Secondary | ICD-10-CM

## 2012-05-06 DIAGNOSIS — K2901 Acute gastritis with bleeding: Secondary | ICD-10-CM | POA: Diagnosis present

## 2012-05-06 DIAGNOSIS — K259 Gastric ulcer, unspecified as acute or chronic, without hemorrhage or perforation: Secondary | ICD-10-CM

## 2012-05-06 DIAGNOSIS — J8410 Pulmonary fibrosis, unspecified: Secondary | ICD-10-CM | POA: Diagnosis present

## 2012-05-06 DIAGNOSIS — J189 Pneumonia, unspecified organism: Principal | ICD-10-CM

## 2012-05-06 DIAGNOSIS — E875 Hyperkalemia: Secondary | ICD-10-CM | POA: Diagnosis present

## 2012-05-06 DIAGNOSIS — R05 Cough: Secondary | ICD-10-CM

## 2012-05-06 DIAGNOSIS — R7309 Other abnormal glucose: Secondary | ICD-10-CM | POA: Diagnosis present

## 2012-05-06 DIAGNOSIS — I10 Essential (primary) hypertension: Secondary | ICD-10-CM

## 2012-05-06 DIAGNOSIS — R1310 Dysphagia, unspecified: Secondary | ICD-10-CM

## 2012-05-06 DIAGNOSIS — D62 Acute posthemorrhagic anemia: Secondary | ICD-10-CM

## 2012-05-06 DIAGNOSIS — J9600 Acute respiratory failure, unspecified whether with hypoxia or hypercapnia: Secondary | ICD-10-CM

## 2012-05-06 DIAGNOSIS — R0902 Hypoxemia: Secondary | ICD-10-CM

## 2012-05-06 DIAGNOSIS — R071 Chest pain on breathing: Secondary | ICD-10-CM

## 2012-05-06 LAB — CBC WITH DIFFERENTIAL/PLATELET
Basophils Absolute: 0 10*3/uL (ref 0.0–0.1)
Basophils Relative: 0 % (ref 0–1)
MCHC: 34 g/dL (ref 30.0–36.0)
Monocytes Absolute: 0.9 10*3/uL (ref 0.1–1.0)
Neutro Abs: 8.1 10*3/uL — ABNORMAL HIGH (ref 1.7–7.7)
Neutrophils Relative %: 87 % — ABNORMAL HIGH (ref 43–77)
Platelets: 139 10*3/uL — ABNORMAL LOW (ref 150–400)
RDW: 12.8 % (ref 11.5–15.5)

## 2012-05-06 LAB — COMPREHENSIVE METABOLIC PANEL
Alkaline Phosphatase: 54 U/L (ref 39–117)
BUN: 20 mg/dL (ref 6–23)
CO2: 23 mEq/L (ref 19–32)
Chloride: 93 mEq/L — ABNORMAL LOW (ref 96–112)
GFR calc Af Amer: >90 mL/min (ref >90)
Glucose, Bld: 118 mg/dL — ABNORMAL HIGH (ref 70–99)
Potassium: 3.4 mEq/L — ABNORMAL LOW (ref 3.5–5.1)
Total Bilirubin: 1.5 mg/dL — ABNORMAL HIGH (ref 0.3–1.2)

## 2012-05-06 LAB — URINALYSIS, ROUTINE W REFLEX MICROSCOPIC
Ketones, ur: 40 mg/dL — AB
Nitrite: POSITIVE — AB
pH: 5.5 (ref 5.0–8.0)

## 2012-05-06 LAB — LACTIC ACID, PLASMA: Lactic Acid, Venous: 1.7 mmol/L (ref 0.5–2.2)

## 2012-05-06 LAB — TROPONIN I: Troponin I: <0.3 ng/mL (ref <0.30)

## 2012-05-06 LAB — PROTIME-INR
INR: 1.68 — ABNORMAL HIGH (ref 0.00–1.49)
Prothrombin Time: 19.2 seconds — ABNORMAL HIGH (ref 11.6–15.2)

## 2012-05-06 LAB — MRSA PCR SCREENING: MRSA by PCR: NEGATIVE

## 2012-05-06 LAB — URINE MICROSCOPIC-ADD ON

## 2012-05-06 LAB — APTT: aPTT: 56 seconds — ABNORMAL HIGH (ref 24–37)

## 2012-05-06 MED ORDER — METOPROLOL TARTRATE 1 MG/ML IV SOLN
2.5000 mg | INTRAVENOUS | Status: DC | PRN
  Administered 2012-05-07: 2.5 mg via INTRAVENOUS
  Filled 2012-05-06: qty 5

## 2012-05-06 MED ORDER — WARFARIN - PHARMACIST DOSING INPATIENT: Freq: Every day | Status: DC

## 2012-05-06 MED ORDER — GABAPENTIN 600 MG PO TABS: 600.0000 mg | ORAL_TABLET | Freq: Three times a day (TID) | ORAL | Status: DC

## 2012-05-06 MED ORDER — ALBUTEROL SULFATE (5 MG/ML) 0.5% IN NEBU
2.5000 mg | INHALATION_SOLUTION | Freq: Four times a day (QID) | RESPIRATORY_TRACT | Status: DC
  Administered 2012-05-06 – 2012-05-07 (×3): 2.5 mg via RESPIRATORY_TRACT
  Filled 2012-05-06 (×3): qty 0.5

## 2012-05-06 MED ORDER — IPRATROPIUM BROMIDE 0.02 % IN SOLN: 0.5000 mg | Freq: Four times a day (QID) | RESPIRATORY_TRACT | Status: DC

## 2012-05-06 MED ORDER — GABAPENTIN 300 MG PO CAPS
600.0000 mg | ORAL_CAPSULE | Freq: Three times a day (TID) | ORAL | Status: DC
  Administered 2012-05-06 – 2012-05-15 (×27): 600 mg via ORAL
  Filled 2012-05-06 (×30): qty 2

## 2012-05-06 MED ORDER — ALBUTEROL SULFATE (5 MG/ML) 0.5% IN NEBU
5.0000 mg | INHALATION_SOLUTION | Freq: Once | RESPIRATORY_TRACT | Status: AC
  Administered 2012-05-06: 5 mg via RESPIRATORY_TRACT
  Filled 2012-05-06: qty 1

## 2012-05-06 MED ORDER — ALBUTEROL (5 MG/ML) CONTINUOUS INHALATION SOLN
10.0000 mg/h | INHALATION_SOLUTION | Freq: Once | RESPIRATORY_TRACT | Status: AC
  Administered 2012-05-06: 10 mg/h via RESPIRATORY_TRACT

## 2012-05-06 MED ORDER — WARFARIN SODIUM 5 MG PO TABS
5.0000 mg | ORAL_TABLET | Freq: Once | ORAL | Status: AC
  Administered 2012-05-06: 5 mg via ORAL
  Filled 2012-05-06: qty 1

## 2012-05-06 MED ORDER — METHYLPREDNISOLONE SODIUM SUCC 125 MG IJ SOLR
60.0000 mg | Freq: Four times a day (QID) | INTRAMUSCULAR | Status: DC
  Administered 2012-05-06 – 2012-05-07 (×4): 60 mg via INTRAVENOUS
  Filled 2012-05-06 (×6): qty 0.96
  Filled 2012-05-06: qty 2

## 2012-05-06 MED ORDER — DOXAZOSIN MESYLATE 4 MG PO TABS
4.0000 mg | ORAL_TABLET | Freq: Every day | ORAL | Status: DC
  Administered 2012-05-07 – 2012-05-15 (×8): 4 mg via ORAL
  Filled 2012-05-06 (×9): qty 1

## 2012-05-06 MED ORDER — IPRATROPIUM BROMIDE 0.02 % IN SOLN
0.5000 mg | Freq: Four times a day (QID) | RESPIRATORY_TRACT | Status: DC
  Administered 2012-05-06 – 2012-05-10 (×15): 0.5 mg via RESPIRATORY_TRACT
  Filled 2012-05-06 (×15): qty 2.5

## 2012-05-06 MED ORDER — M.V.I. ADULT IV INJ
INJECTION | Freq: Once | INTRAVENOUS | Status: AC
  Administered 2012-05-06: 17:00:00 via INTRAVENOUS
  Filled 2012-05-06: qty 1000

## 2012-05-06 MED ORDER — ALBUTEROL SULFATE (5 MG/ML) 0.5% IN NEBU
INHALATION_SOLUTION | RESPIRATORY_TRACT | Status: AC
  Filled 2012-05-06: qty 2

## 2012-05-06 MED ORDER — IPRATROPIUM BROMIDE 0.02 % IN SOLN
0.5000 mg | Freq: Once | RESPIRATORY_TRACT | Status: AC
  Administered 2012-05-06: 0.5 mg via RESPIRATORY_TRACT
  Filled 2012-05-06: qty 2.5

## 2012-05-06 MED ORDER — ALBUTEROL SULFATE (5 MG/ML) 0.5% IN NEBU: 2.5000 mg | INHALATION_SOLUTION | Freq: Four times a day (QID) | RESPIRATORY_TRACT | Status: DC

## 2012-05-06 MED ORDER — METHYLPREDNISOLONE SODIUM SUCC 125 MG IJ SOLR
125.0000 mg | Freq: Once | INTRAMUSCULAR | Status: AC
  Administered 2012-05-06: 125 mg via INTRAVENOUS
  Filled 2012-05-06: qty 2

## 2012-05-06 MED ORDER — DUTASTERIDE 0.5 MG PO CAPS
0.5000 mg | ORAL_CAPSULE | Freq: Every day | ORAL | Status: DC
  Administered 2012-05-07 – 2012-05-15 (×9): 0.5 mg via ORAL
  Filled 2012-05-06 (×10): qty 1

## 2012-05-06 MED ORDER — PANTOPRAZOLE SODIUM 40 MG IV SOLR
40.0000 mg | INTRAVENOUS | Status: DC
  Filled 2012-05-06: qty 40

## 2012-05-06 MED ORDER — ALBUTEROL SULFATE (5 MG/ML) 0.5% IN NEBU: 2.5000 mg | INHALATION_SOLUTION | Freq: Once | RESPIRATORY_TRACT | Status: DC

## 2012-05-06 MED ORDER — SODIUM CHLORIDE 0.9 % IV BOLUS (SEPSIS)
500.0000 mL | Freq: Once | INTRAVENOUS | Status: AC
  Administered 2012-05-06: 500 mL via INTRAVENOUS

## 2012-05-06 MED ORDER — LEVOFLOXACIN IN D5W 750 MG/150ML IV SOLN
750.0000 mg | INTRAVENOUS | Status: AC
  Administered 2012-05-06 – 2012-05-08 (×3): 750 mg via INTRAVENOUS
  Filled 2012-05-06 (×4): qty 150

## 2012-05-06 NOTE — H&P (Signed)
PULMONARY  / CRITICAL CARE MEDICINE  Name: [name] MRN: [id number] DOB: [birth year]    LOS: 0  REFERRING MD :  Rito Ehrlich   CHIEF COMPLAINT:  resp failure   BRIEF PATIENT DESCRIPTION:  70 yowm remote smoker maintained on ACEI chronically admitted on 12/4 w/ acute respiratory failure in setting of presumed bronchitis   SIGNIFICANT EVENTS:    LINES / TUBES:   CULTURES: bcx 2 12/4>>> UC 12/4>>> Urine strep 12/4>>> Urine legionella 12/4>>>  ANTIBIOTICS: levaquin 12/4>>>    LEVEL OF CARE:  SDU PRIMARY SERVICE:  PCCM CONSULTANTS:   CODE STATUS: limited, intubation, pressors and antiarrhythmics  DIET:  NPO  DVT Px:  Coumadin  GI Px:  PPI   HISTORY OF PRESENT ILLNESS:   This is a 78 year old male w/ chronic and slowly progressive dyspnea. Stopped smoking in 1980s. Reports on good day over the last year he can ambulate about 100 ft before onset of significant dyspnea and usually limits his activity because of this. He feels that this has been slowly worse. Acutely on 12/2 he fell in his bathroom. He denies LOC, and reports he was in usual [facility]. He reports after this he had some CP w/ deep breath, developed a cough w/ yellow sputum, increased wheezing and eventually resting dyspnea. He presented to the ER on 12/4 in acute distress. He was placed on BIPAP, PCCM asked to admit.   PAST MEDICAL HISTORY :  Past Medical History  Diagnosis Date  . History of upper gastrointestinal hemorrhage   . Cerebrovascular accident   . Gastric polyp   . Dysphagia, unspecified   . Coronary artery disease     nonobstructive by cath 2009  . Permanent atrial fibrillation   . Hypertension   . Esophagitis   . GERD (gastroesophageal reflux disease)   . Erosive gastritis   . FH: colonic polyps   . Gastric ulcer   . Hiatal hernia   . Internal hemorrhoids    Past Surgical History  Procedure Date  . Cholecystectomy   . Asd repair [id number]  . Inguinal hernia repair 1963  . Partial small bowel  resection 2005    ischemic?   Prior to Admission medications   Medication Sig Start Date End Date Taking? Authorizing Provider  amLODipine (NORVASC) 5 MG tablet Take 1 tablet (5 mg total) by mouth daily. 02/04/12  Yes Kristian Covey, MD  AVODART 0.5 MG capsule Take 0.5 mg by mouth daily.  01/03/11  Yes Historical Provider, MD  cyanocobalamin (,VITAMIN B-12,) 1000 MCG/ML injection Inject 1,000 mcg into the muscle once.     Yes Historical Provider, MD  doxazosin (CARDURA) 4 MG tablet Take 4 mg by mouth daily.  01/09/12  Yes Historical Provider, MD  enalapril (VASOTEC) 10 MG tablet Take 1 tablet (10 mg total) by mouth 2 (two) times daily. 02/05/12  Yes Kristian Covey, MD  gabapentin (NEURONTIN) 600 MG tablet Take 1 tablet (600 mg total) by mouth 3 (three) times daily. 08/15/11 08/14/12 Yes Kristian Covey, MD  hydrochlorothiazide (HYDRODIURIL) 25 MG tablet Take 1/2 tab daily 02/04/12  Yes Kristian Covey, MD  LOTRISONE cream APPLY TO AFFECTED AREAS TWICE A DAY 02/18/12  Yes Kristian Covey, MD  PRILOSEC 20 MG capsule TAKE (2) CAPSULES DAILY. 08/24/11  Yes Kristian Covey, MD  warfarin (COUMADIN) 5 MG tablet Take 2.5-5 mg by mouth See admin instructions. Pt takes 2.5 mg every day except on Monday pt takes 5 mg  Yes Historical Provider, MD  ZOCOR 20 MG tablet TAKE ONE TABLET AT BEDTIME 01/15/12  Yes Kristian Covey, MD   Allergies  Allergen Reactions  . Penicillins     hives    FAMILY HISTORY:  History reviewed. No pertinent family history. SOCIAL HISTORY:  reports that he quit smoking about 28 years ago. His smoking use included Cigarettes. He has a 20 pack-year smoking history. He has never used smokeless tobacco. He reports that he does not drink alcohol or use illicit drugs.  REVIEW OF SYSTEMS: Review of Systems  Constitutional: No weight loss, gain, night sweats, +Fevers, -chills, fatigue .  HEENT: No headaches, visual changes, Difficulty swallowing, Tooth/dental problems, or Sore  throat,  No sneezing, itching, ear ache, nasal congestion, post nasal drip, no visual complaints CV: +chest pain,- Orthopnea, -PND,- swelling in lower extremities, -dizziness, -palpitations, syncope.  GI No heartburn, indigestion, abdominal pain, nausea, vomiting, diarrhea, change in bowel habits, loss of appetite, bloody stools.  Resp:+ cough, No coughing up of blood. + change in color of mucus. +wheezing.  Skin: no rash or itching or icterus GU: no dysuria, change in color of urine, no urgency or frequency. No flank pain, no hematuria  MS: No joint pain or swelling. No decreased range of motion  Psych: No change in mood or affect. No depression or anxiety.  Neuro: no difficulty with speech, weakness, numbness, ataxia     INTERVAL HISTORY:  Now comfortable on BIPAP  VITAL SIGNS: Temp:  [98.5 F (36.9 C)-99.5 F (37.5 C)] 98.5 F (36.9 C) (12/04 1158) Pulse Rate:  [108-147] 125  (12/04 1405) Resp:  [20-27] 27  (12/04 1405) BP: (100-150)/(39-80) 100/39 mmHg (12/04 1405) SpO2:  [90 %-97 %] 97 % (12/04 1405) Weight:  [77.565 kg (171 lb)] 77.565 kg (171 lb) (12/04 0818) HEMODYNAMICS:   VENTILATOR SETTINGS:   INTAKE / OUTPUT: Intake/Output    None     PHYSICAL EXAMINATION: General:  Elderly male currently resting comfortably on BIPAP  Neuro:  Awake, oriented, no focal def  HEENT:  [place], no JVD, BIPAP mask in place,.  Cardiovascular:  rrr Lungs:  Distant expiratory wheeze  Abdomen:  Soft non-tender  Musculoskeletal:  Intact  Skin:  Intact, multiple areas of ecchymosis    LABS: Cbc  Lab 05/06/12 1025  WBC 9.3  HGB 14.5  HCT 42.6  PLT 139*    Chemistry   Lab 05/06/12 1025  NA 133*  K 3.4*  CL 93*  CO2 23  BUN 20  CREATININE 0.85  CALCIUM 9.2  MG --  PHOS --  GLUCOSE 118*    Liver fxn  Lab 05/06/12 1025  AST 27  ALT 16  ALKPHOS 54  BILITOT 1.5*  PROT 7.2  ALBUMIN 3.2*   coags  Lab 05/06/12 1025  APTT 56*  INR 1.68*   Sepsis markers  Lab  05/06/12 1025  LATICACIDVEN 1.7  PROCALCITON --   Cardiac markers  Lab 05/06/12 1012  CKTOTAL --  CKMB --  TROPONINI <0.30   BNP No results found for this basename: PROBNP:3 in the last 168 hours ABG No results found for this basename: PHART:3,PCO2ART:3,PO2ART:3,HCO3:3,TCO2:3 in the last 168 hours  CBG trend No results found for this basename: GLUCAP:5 in the last 168 hours  IMAGING: PCXR: chronic interstitial markings c/w fibrotic changes.  ECG:  DIAGNOSES: Active Problems:  Atrial fibrillation with RVR  Acute respiratory failure  Dysphagia  HTN (hypertension)  H/O: CVA (cerebrovascular accident)  GERD (gastroesophageal reflux disease)  Purulent bronchitis   ASSESSMENT / PLAN:  PULMONARY  ASSESSMENT: Acute respiratory failure in setting of purulent bronchitis superimposed on what looks like chronic interstitial lung disease.  Stopped smoking in 1980s, could be element of COPD but unlikely. He has h/o CVA so chronic aspiration could certainly be a consideration.   NB When respiratory symptoms begin or become refractory well after a patient reports complete smoking cessation,  Especially when this wasn't the case while they were smoking, a red flag is raised based on the work of Dr Primitivo Gauze which states:  if you quit smoking when your best day FEV1 is still well preserved it is highly unlikely you will progress to severe disease.  That is to say, once the smoking stops,  the symptoms should not suddenly erupt or markedly worsen.  If so, the differential diagnosis should include  obesity/deconditioning,  LPR/Reflux/Aspiration syndromes,  occult CHF, or  especially side effect of medications commonly used in this population, especially acei and non-specific Beta blockers, which may be the case here  PLAN:   BIPAP Scheduled nebs Systemic steroids Empiric abx D/c acei for now  CARDIOVASCULAR  ASSESSMENT:  afib w/ RVR  PLAN:  Cont coumadin  Cont rate control,  add low dose b blocker and hydration D/c acei indefinitely as many of his symptoms might be related    RENAL  ASSESSMENT:   No acute issues PLAN:   Gentle hydration  Monitor and replace lytes as needed   GASTROINTESTINAL  ASSESSMENT:   GERD Dysphagia  Think his dysphagia may be playing a larger role but no need to eval until off acei x 4-6 weeks    HEMATOLOGIC  ASSESSMENT:   Chronic coumadin No evidence of bleeding  PLAN:  F/u INR Trend CBC   INFECTIOUS  ASSESSMENT:   Purulent bronchitis  PLAN:   See dash board   ENDOCRINE  ASSESSMENT:  No issues PLAN:   Fasting am glucose   NEUROLOGIC  ASSESSMENT:   H/o cva  PLAN:   Supportive   CLINICAL SUMMARY: 78 year old male w/ chronic dyspnea w/ CXR suggesting underlying chronic interstitial changes now being admitted for purulent bronchitis and resp distress ? What role acei having here?. Will treat w/ empiric abx, systemic steroids and BDs. Will need further eval of chronic dyspnea when better.   I have personally obtained a history, examined the patient, evaluated laboratory and imaging results, formulated the assessment and plan and placed orders. CRITICAL CARE: The patient is critically ill with multiple organ systems failure and requires high complexity decision making for assessment and support, frequent evaluation and titration of therapies, application of advanced monitoring technologies and extensive interpretation of multiple databases. Critical Care Time devoted to patient care services described in this note is  45 minutes.   Sandrea Hughs, MD Pulmonary and Critical Care Medicine [facility] Cell [phone]   Pulmonary and Critical Care Medicine [facility] Pager: [phone]  05/06/2012, 4:02 PM

## 2012-05-06 NOTE — ED Notes (Signed)
Pt's BP 105/55. EDP [place] made aware. No new orders at this time

## 2012-05-06 NOTE — Progress Notes (Signed)
Subjective:    Patient ID: [name], male    DOB: 30-Nov-1932, 78 y.o.   MRN: [id number]  HPI  Acute walk-in visit. Patient apparently fell around 3 AM Monday. Family gives history that he had some congestion and cough for couple days preceding that. He got to go the bathroom 3 AM Monday morning and fell apparently landing on right side. He's had some right chest wall pain since then. He has complained of some dyspnea apparently before the fall. Intermittent wheezing. Possible low-grade fever. Patient is on Coumadin chronically for atrial fib history. He has multiple chronic problems including history of CAD, B12 deficiency, hyperlipidemia, history of peripheral neuropathy, hypertension, remote history of CVA, remote history gastric ulcer, GERD, and BPH.  Medications reviewed. Compliant with Coumadin. No ecchymosis or any other bleeding complications. Denies any recent chest pains other than chest wall pain. Denies any extremity pain. No headache. No head injury. He's had poor appetite past couple days. No nausea or vomiting.  Past Medical History  Diagnosis Date  . History of upper gastrointestinal hemorrhage   . Cerebrovascular accident   . Gastric polyp   . Dysphagia, unspecified   . Coronary artery disease     nonobstructive by cath 2009  . Permanent atrial fibrillation   . Hypertension   . Esophagitis   . GERD (gastroesophageal reflux disease)   . Erosive gastritis   . FH: colonic polyps   . Gastric ulcer   . Hiatal hernia   . Internal hemorrhoids    Past Surgical History  Procedure Date  . Cholecystectomy   . Asd repair [id number]  . Inguinal hernia repair 1963  . Partial small bowel resection 2005    ischemic?    reports that he quit smoking about 28 years ago. His smoking use included Cigarettes. He has a 20 pack-year smoking history. He does not have any smokeless tobacco history on file. He reports that he does not drink alcohol or use illicit drugs. family history is  not on file. Allergies  Allergen Reactions  . Penicillins     hives      Review of Systems  Constitutional: Positive for appetite change and fatigue. Negative for fever and chills.  HENT: Positive for congestion.   Respiratory: Positive for cough, shortness of breath and wheezing.   Cardiovascular: Negative for palpitations and leg swelling.  Gastrointestinal: Negative for abdominal pain.  Genitourinary: Negative for dysuria.  Neurological: Negative for dizziness.  Psychiatric/Behavioral: Negative for confusion.       Objective:   Physical Exam  Constitutional: He appears well-developed and well-nourished.  HENT:  Mouth/Throat: Oropharynx is clear and moist.  Neck: Neck supple. No thyromegaly present.  Cardiovascular:       Patient has slightly irregular rhythm and rate around 105-100 at rest  Pulmonary/Chest:       Patient has some diffuse wheezes. Somewhat diminished breath sounds throughout. Slightly decreased aeration left base compared to right.  Musculoskeletal: He exhibits no edema.  Neurological: He is alert.          Assessment & Plan:  Patient presents with recent onset of cough, low-grade fever, and dyspnea. Concern clinically that he may be developing pneumonia-?LLL. He had flu vaccine earlier. He has evidence for wheezing and reactive airway component. He was given nebulizer in the office with minimal improvement with O2 sats around 90% at rest. He has poor appetite and very little fluid or food intake past couple days. We've recommended transfer to emergency department for  further evaluation and patient and daughter in agreement with that plan.

## 2012-05-06 NOTE — Progress Notes (Signed)
ANTICOAGULATION CONSULT NOTE - Initial Consult  Pharmacy Consult for Coumadin Indication: Hx Afib, CVA  Allergies  Allergen Reactions  . Penicillins     hives    Patient Measurements: 77.6kg 70in  Vital Signs: Temp: 98.5 F (36.9 C) (12/04 1158) Temp src: Oral (12/04 1158) BP: 100/39 mmHg (12/04 1405) Pulse Rate: 125  (12/04 1405)  Labs:  Basename 05/06/12 1025 05/06/12 1012  HGB 14.5 --  HCT 42.6 --  PLT 139* --  APTT 56* --  LABPROT 19.2* --  INR 1.68* --  HEPARINUNFRC -- --  CREATININE 0.85 --  CKTOTAL -- --  CKMB -- --  TROPONINI -- <0.30    The CrCl is unknown because both a height and weight (above a minimum accepted value) are required for this calculation.   Medical History: Past Medical History  Diagnosis Date  . History of upper gastrointestinal hemorrhage   . Cerebrovascular accident   . Gastric polyp   . Dysphagia, unspecified   . Coronary artery disease     nonobstructive by cath 2009  . Permanent atrial fibrillation   . Hypertension   . Esophagitis   . GERD (gastroesophageal reflux disease)   . Erosive gastritis   . FH: colonic polyps   . Gastric ulcer   . Hiatal hernia   . Internal hemorrhoids     Medications:  Scheduled:    . albuterol  2.5 mg Nebulization Q6H  . [COMPLETED] albuterol  5 mg Nebulization Once  . albuterol      . [COMPLETED] albuterol  10 mg/hr Nebulization Once  . doxazosin  4 mg Oral Daily  . dutasteride  0.5 mg Oral Daily  . gabapentin  600 mg Oral TID  . [COMPLETED] ipratropium  0.5 mg Nebulization Once  . ipratropium  0.5 mg Nebulization Q6H  . [COMPLETED] methylPREDNISolone (SOLU-MEDROL) injection  125 mg Intravenous Once  . methylPREDNISolone (SOLU-MEDROL) injection  60 mg Intravenous Q6H  . pantoprazole (PROTONIX) IV  40 mg Intravenous Q24H  . [DISCONTINUED] albuterol  2.5 mg Nebulization Once  . [DISCONTINUED] albuterol  2.5 mg Nebulization Q6H  . [DISCONTINUED] gabapentin  600 mg Oral TID  .  [DISCONTINUED] ipratropium  0.5 mg Nebulization Q6H   Infusions:    . levofloxacin (LEVAQUIN) IV Stopped (05/06/12 1327)  . general admission iv infusion    . [COMPLETED] sodium chloride Stopped (05/06/12 1144)    Assessment:  66 YOM admitted with bronchitis  Chronic coumadin for hx Afib, CVA  Home dose 2.5mg  daily except 5mg  mondays, last dose 12/3 (2.5mg )  INR subtherapeutic today (1.68)  No bleeding reported  Note potential drug interaction with levaquin - will monitor   Goal of Therapy:  INR 2-3   Plan:   Increase coumadin to 5mg  po today  Daily PT/INR  Monitor for interaction with levaquin  Loralee Pacas, PharmD, BCPS Pager: [phone] 05/06/2012,4:26 PM

## 2012-05-06 NOTE — Telephone Encounter (Signed)
Pt here 8am for OV

## 2012-05-06 NOTE — ED Notes (Signed)
Patient reports that he fell 3 days ago and has pain to the right rib cage under the axilla. Patient also has a productive cough with light yellow sputum. Patient went to his PCP this AM and was given an albuterol neb, but sats remained low. Patient was told to come to the ED. Patient denies CP.

## 2012-05-06 NOTE — ED Notes (Signed)
Pt tachycardia 135-150. EDP [place] made aware. Pt still Shob at this time. And very sweaty.

## 2012-05-06 NOTE — ED Provider Notes (Signed)
History     CSN: [id number]  Arrival date & time 05/06/12  7829   First MD Initiated Contact with Patient 05/06/12 1002      No chief complaint on file.   (Consider location/radiation/quality/duration/timing/severity/associated sxs/prior treatment) HPI Pt with 2 days of cough productive of yellow sputum, wheezing and SOB. Pt fell yesterday and struck R chest and arm. C/O pain at the site. No head or neck trauma. No focal weakness. No sensory loss. Pt seen by PMD and referred to ED.  Past Medical History  Diagnosis Date  . History of upper gastrointestinal hemorrhage   . Cerebrovascular accident   . Gastric polyp   . Dysphagia, unspecified   . Coronary artery disease     nonobstructive by cath 2009  . Permanent atrial fibrillation   . Hypertension   . Esophagitis   . GERD (gastroesophageal reflux disease)   . Erosive gastritis   . FH: colonic polyps   . Gastric ulcer   . Hiatal hernia   . Internal hemorrhoids     Past Surgical History  Procedure Date  . Cholecystectomy   . Asd repair [id number]  . Inguinal hernia repair 1963  . Partial small bowel resection 2005    ischemic?    History reviewed. No pertinent family history.  History  Substance Use Topics  . Smoking status: Former Smoker -- 1.0 packs/day for 20 years    Types: Cigarettes    Quit date: 06/13/1983  . Smokeless tobacco: Never Used  . Alcohol Use: No      Review of Systems  Constitutional: Positive for chills and fatigue. Negative for fever.  HENT: Negative for neck pain and neck stiffness.   Eyes: Negative for visual disturbance.  Respiratory: Positive for cough, shortness of breath and wheezing.   Cardiovascular: Positive for chest pain. Negative for palpitations and leg swelling.  Gastrointestinal: Negative for vomiting, abdominal pain, diarrhea and constipation.  Genitourinary: Negative for dysuria and flank pain.  Musculoskeletal: Negative for myalgias and back pain.  Skin: Negative for rash  and wound.  Neurological: Negative for dizziness, weakness, light-headedness, numbness and headaches.  Hematological: Bruises/bleeds easily.    Allergies  Penicillins  Home Medications   Current Outpatient Rx  Name  Route  Sig  Dispense  Refill  . AMLODIPINE BESYLATE 5 MG PO TABS   Oral   Take 1 tablet (5 mg total) by mouth daily.   30 tablet   5   . AVODART 0.5 MG PO CAPS   Oral   Take 0.5 mg by mouth daily.          . CYANOCOBALAMIN 1000 MCG/ML IJ SOLN   Intramuscular   Inject 1,000 mcg into the muscle once.           [name] DOXAZOSIN MESYLATE 4 MG PO TABS   Oral   Take 4 mg by mouth daily.          . ENALAPRIL MALEATE 10 MG PO TABS   Oral   Take 1 tablet (10 mg total) by mouth 2 (two) times daily.   60 tablet   2   . GABAPENTIN 600 MG PO TABS   Oral   Take 1 tablet (600 mg total) by mouth 3 (three) times daily.   90 tablet   6   . HYDROCHLOROTHIAZIDE 25 MG PO TABS      Take 1/2 tab daily   30 tablet   3   . LOTRISONE 1-0.05 % EX CREA  APPLY TO AFFECTED AREAS TWICE A DAY   45 g   1   . PRILOSEC 20 MG PO CPDR      TAKE (2) CAPSULES DAILY.   180 each   3   . WARFARIN SODIUM 5 MG PO TABS   Oral   Take 2.5-5 mg by mouth See admin instructions. Pt takes 2.5 mg every day except on Monday pt takes 5 mg         . ZOCOR 20 MG PO TABS      TAKE ONE TABLET AT BEDTIME   30 each   11     BP 100/39  Pulse 125  Temp 98.5 F (36.9 C) (Oral)  Resp 27  SpO2 97%  Physical Exam  Nursing note and vitals reviewed. Constitutional: He is oriented to person, place, and time. He appears well-developed and well-nourished. No distress.  HENT:  Head: Normocephalic and atraumatic.       Mildly dry MM  Eyes: EOM are normal. Pupils are equal, round, and reactive to light.  Neck: Normal range of motion. Neck supple.       No posterior midline TTP, FROM, no meningismus   Cardiovascular:       Tachy irreg irreg  Pulmonary/Chest: No respiratory distress.  He has wheezes. He has rales. He exhibits tenderness (TTP R ant chest. no crepitance. pain reproduced completely).       Increased resp effort, wheezing and rhonchi throughout  Abdominal: Soft. Bowel sounds are normal. He exhibits no mass. There is no tenderness. There is no rebound and no guarding.  Musculoskeletal: Normal range of motion. He exhibits no edema and no tenderness.       R elbow with skin tear and contusion. FROM or R elbow with no bony tenderness  Neurological: He is alert and oriented to person, place, and time.       5/5 motor in all ext, sensation intact  Skin: Skin is warm and dry. No rash noted. No erythema.  Psychiatric: He has a normal mood and affect. His behavior is normal.    ED Course  Procedures (including critical care time)  Labs Reviewed  CBC WITH DIFFERENTIAL - Abnormal; Notable for the following:    Platelets 139 (*)     Neutrophils Relative 87 (*)     Neutro Abs 8.1 (*)     Lymphocytes Relative 4 (*)     Lymphs Abs 0.4 (*)     All other components within normal limits  PROTIME-INR - Abnormal; Notable for the following:    Prothrombin Time 19.2 (*)     INR 1.68 (*)     All other components within normal limits  APTT - Abnormal; Notable for the following:    aPTT 56 (*)     All other components within normal limits  URINALYSIS, ROUTINE W REFLEX MICROSCOPIC - Abnormal; Notable for the following:    Color, Urine ORANGE (*)  BIOCHEMICALS MAY BE AFFECTED BY COLOR   Hgb urine dipstick SMALL (*)     Bilirubin Urine MODERATE (*)     Ketones, ur 40 (*)     Protein, ur 100 (*)     Nitrite POSITIVE (*)     Leukocytes, UA TRACE (*)     All other components within normal limits  URINE MICROSCOPIC-ADD ON - Abnormal; Notable for the following:    Bacteria, UA FEW (*)     Casts HYALINE CASTS (*)  GRANULAR CAST   All other components within  normal limits  COMPREHENSIVE METABOLIC PANEL - Abnormal; Notable for the following:    Sodium 133 (*)     Potassium  3.4 (*)     Chloride 93 (*)     Glucose, Bld 118 (*)     Albumin 3.2 (*)     Total Bilirubin 1.5 (*)     GFR calc non Af Amer 82 (*)     All other components within normal limits  LACTIC ACID, PLASMA  TROPONIN I  URINE CULTURE  CULTURE, BLOOD (ROUTINE X 2)  CULTURE, BLOOD (ROUTINE X 2)   Dg Chest Port 1 View  05/06/2012  *RADIOLOGY REPORT*  Clinical Data: Cough and fever.  PORTABLE CHEST - 1 VIEW  Comparison: 04/03/2010.  Findings: The heart is enlarged but stable.  Stable prominent pulmonary hila.  There are chronic bronchitic and fibrotic lung changes.  No definite acute overlying pulmonary process.  The bony thorax is intact.  IMPRESSION: Chronic lung changes without obvious overlying acute pulmonary findings.   Original Report Authenticated By: Rudie Meyer, M.D.      1. Community acquired pneumonia   2. Bronchospasm   3. Atrial fibrillation with RVR      Date: 05/06/2012  Rate: 118  Rhythm: atrial fibrillation  QRS Axis: normal  Intervals: QRS prolonged  ST/T Wave abnormalities: nonspecific T wave changes  Conduction Disutrbances:right bundle branch block  Narrative Interpretation:   Old EKG Reviewed: changes noted  CRITICAL CARE Performed by: Ranae Palms, Meliton Samad   Total critical care time: 45 min  Critical care time was exclusive of separately billable procedures and treating other patients.  Critical care was necessary to treat or prevent imminent or life-threatening deterioration.  Critical care was time spent personally by me on the following activities: development of treatment plan with patient and/or surrogate as well as nursing, discussions with consultants, evaluation of patient's response to treatment, examination of patient, obtaining history from patient or surrogate, ordering and performing treatments and interventions, ordering and review of laboratory studies, ordering and review of radiographic studies, pulse oximetry and re-evaluation of patient's  condition.   MDM   Pt respiratory status continued to worsen despite Nebs. Contacted [facility] and informed of status change. Suggested Critical care be consulted to eval. Critical care will see in ED.   Pt is much more comfortable on bipap.        Loren Racer, MD 05/06/12 [phone]

## 2012-05-06 NOTE — ED Notes (Signed)
Hospitalist at bedside 

## 2012-05-06 NOTE — ED Notes (Signed)
RT at bedside.

## 2012-05-07 ENCOUNTER — Inpatient Hospital Stay (HOSPITAL_COMMUNITY): Payer: Medicare Other

## 2012-05-07 DIAGNOSIS — I4891 Unspecified atrial fibrillation: Secondary | ICD-10-CM

## 2012-05-07 DIAGNOSIS — J411 Mucopurulent chronic bronchitis: Secondary | ICD-10-CM

## 2012-05-07 DIAGNOSIS — R1310 Dysphagia, unspecified: Secondary | ICD-10-CM

## 2012-05-07 LAB — GLUCOSE, CAPILLARY
Glucose-Capillary: 145 mg/dL — ABNORMAL HIGH (ref 70–99)
Glucose-Capillary: 153 mg/dL — ABNORMAL HIGH (ref 70–99)
Glucose-Capillary: 161 mg/dL — ABNORMAL HIGH (ref 70–99)
Glucose-Capillary: 200 mg/dL — ABNORMAL HIGH (ref 70–99)
Glucose-Capillary: 226 mg/dL — ABNORMAL HIGH (ref 70–99)

## 2012-05-07 LAB — CBC
HCT: 39.7 % (ref 39.0–52.0)
Hemoglobin: 13.4 g/dL (ref 13.0–17.0)
MCH: 29.6 pg (ref 26.0–34.0)
MCHC: 33.8 g/dL (ref 30.0–36.0)
MCV: 87.8 fL (ref 78.0–100.0)
RBC: 4.52 MIL/uL (ref 4.22–5.81)

## 2012-05-07 LAB — URINE CULTURE

## 2012-05-07 LAB — BASIC METABOLIC PANEL
BUN: 25 mg/dL — ABNORMAL HIGH (ref 6–23)
CO2: 25 mEq/L (ref 19–32)
Glucose, Bld: 185 mg/dL — ABNORMAL HIGH (ref 70–99)
Potassium: 3.1 mEq/L — ABNORMAL LOW (ref 3.5–5.1)
Sodium: 134 mEq/L — ABNORMAL LOW (ref 135–145)

## 2012-05-07 MED ORDER — METHYLPREDNISOLONE SODIUM SUCC 125 MG IJ SOLR
60.0000 mg | Freq: Two times a day (BID) | INTRAMUSCULAR | Status: DC
  Administered 2012-05-07 – 2012-05-08 (×2): 60 mg via INTRAVENOUS
  Filled 2012-05-07 (×3): qty 0.96

## 2012-05-07 MED ORDER — DILTIAZEM HCL 100 MG IV SOLR
5.0000 mg/h | INTRAVENOUS | Status: DC
  Administered 2012-05-07 (×2): 5 mg/h via INTRAVENOUS

## 2012-05-07 MED ORDER — DIPHENHYDRAMINE HCL 25 MG PO CAPS
ORAL_CAPSULE | ORAL | Status: AC
  Administered 2012-05-07: 25 mg via ORAL
  Filled 2012-05-07: qty 1

## 2012-05-07 MED ORDER — LEVALBUTEROL HCL 0.63 MG/3ML IN NEBU
0.6300 mg | INHALATION_SOLUTION | Freq: Four times a day (QID) | RESPIRATORY_TRACT | Status: DC
  Administered 2012-05-07 – 2012-05-10 (×12): 0.63 mg via RESPIRATORY_TRACT
  Filled 2012-05-07 (×18): qty 3

## 2012-05-07 MED ORDER — INSULIN ASPART 100 UNIT/ML ~~LOC~~ SOLN
0.0000 [IU] | SUBCUTANEOUS | Status: DC
  Administered 2012-05-07 (×2): 4 [IU] via SUBCUTANEOUS
  Administered 2012-05-07: 3 [IU] via SUBCUTANEOUS

## 2012-05-07 MED ORDER — INSULIN ASPART 100 UNIT/ML ~~LOC~~ SOLN
0.0000 [IU] | Freq: Three times a day (TID) | SUBCUTANEOUS | Status: DC
  Administered 2012-05-07: 7 [IU] via SUBCUTANEOUS
  Administered 2012-05-07 – 2012-05-09 (×5): 4 [IU] via SUBCUTANEOUS
  Administered 2012-05-09: 3 [IU] via SUBCUTANEOUS
  Administered 2012-05-09: 4 [IU] via SUBCUTANEOUS
  Administered 2012-05-10: 3 [IU] via SUBCUTANEOUS
  Administered 2012-05-10: 4 [IU] via SUBCUTANEOUS
  Administered 2012-05-11 – 2012-05-12 (×3): 3 [IU] via SUBCUTANEOUS
  Administered 2012-05-12: 7 [IU] via SUBCUTANEOUS

## 2012-05-07 MED ORDER — DILTIAZEM LOAD VIA INFUSION
10.0000 mg | Freq: Once | INTRAVENOUS | Status: AC
  Administered 2012-05-07: 10 mg via INTRAVENOUS
  Filled 2012-05-07: qty 10

## 2012-05-07 MED ORDER — INSULIN ASPART 100 UNIT/ML ~~LOC~~ SOLN: 0.0000 [IU] | Freq: Every day | SUBCUTANEOUS | Status: DC

## 2012-05-07 MED ORDER — DIPHENHYDRAMINE HCL 25 MG PO CAPS
25.0000 mg | ORAL_CAPSULE | Freq: Every evening | ORAL | Status: DC | PRN
Start: 2012-05-07 — End: 2012-05-08
  Administered 2012-05-07: 25 mg via ORAL

## 2012-05-07 MED ORDER — POTASSIUM CHLORIDE CRYS ER 20 MEQ PO TBCR
40.0000 meq | EXTENDED_RELEASE_TABLET | Freq: Two times a day (BID) | ORAL | Status: AC
  Administered 2012-05-07 (×2): 40 meq via ORAL
  Filled 2012-05-07 (×2): qty 2

## 2012-05-07 MED ORDER — PANTOPRAZOLE SODIUM 40 MG PO TBEC
40.0000 mg | DELAYED_RELEASE_TABLET | Freq: Every day | ORAL | Status: DC
  Administered 2012-05-07 – 2012-05-13 (×7): 40 mg via ORAL
  Filled 2012-05-07 (×8): qty 1

## 2012-05-07 MED ORDER — LEVOFLOXACIN 500 MG PO TABS
500.0000 mg | ORAL_TABLET | Freq: Every day | ORAL | Status: DC
  Administered 2012-05-09 – 2012-05-15 (×7): 500 mg via ORAL
  Filled 2012-05-07 (×7): qty 1

## 2012-05-07 MED ORDER — WARFARIN SODIUM 4 MG PO TABS
4.0000 mg | ORAL_TABLET | Freq: Once | ORAL | Status: AC
  Administered 2012-05-07: 4 mg via ORAL
  Filled 2012-05-07: qty 1

## 2012-05-07 MED ORDER — BISOPROLOL FUMARATE 5 MG PO TABS
5.0000 mg | ORAL_TABLET | Freq: Every day | ORAL | Status: DC
  Administered 2012-05-07 – 2012-05-15 (×8): 5 mg via ORAL
  Filled 2012-05-07 (×9): qty 1

## 2012-05-07 MED ORDER — DILTIAZEM LOAD VIA INFUSION
10.0000 mg | Freq: Once | INTRAVENOUS | Status: AC | PRN
  Filled 2012-05-07: qty 10

## 2012-05-07 NOTE — Clinical Documentation Improvement (Signed)
CHF DOCUMENTATION CLARIFICATION QUERY  THIS DOCUMENT IS NOT A PERMANENT PART OF THE MEDICAL RECORD  TO RESPOND TO THE THIS QUERY, FOLLOW THE INSTRUCTIONS BELOW:  1. If needed, update documentation for the patient's encounter via the notes activity.  2. Access this query again and click edit on the In [employer].  3. After updating, or not, click F2 to complete all highlighted (required) fields concerning your review. Select "additional documentation in the medical record" OR "no additional documentation provided".  4. Click Sign note button.  5. The deficiency will fall out of your In Basket *Please let us know if you are not able to complete this workflow by phone or e-mail (listed below).  Please update your documentation within the medical record to reflect your response to this query.                                                                                    05/07/12  Dear Dr. Sherene Sires, [facility],  In a better effort to capture your patient's severity of illness, reflect appropriate length of stay and utilization of resources, a review of the patient medical record has revealed the following indicators the diagnosis of Heart Failure.    Based on your clinical judgment, please clarify and document in a progress note and/or discharge summary the clinical condition associated with the following supporting information:  In responding to this query please exercise your independent judgment.  The fact that a query is asked, does not imply that any particular answer is desired or expected.  Based on your clinical judgment can you provide a diagnosis that represents the below listed clinical indicators?  In this patient admitted with Acute respiratory failure a review of the medical record reveals the following:   Pt with "occult CHF" per HP  AFIB per HP  Dyspnea  Pulmonary HTN per CXR 05/06/12  Enlarged Cardia Silhouette per CXR 05/06/12  Treatment:  NORVASC  AVODART    CARDURA  ATROVENT  PROVENTIL  SOLU-MEDROL  LOPRESSOR   Clarification Needed: Please clarify the acuity and type of CHF and document in pn and d/c summary.       Possible Clinical Conditions?   Chronic Systolic Congestive Heart Failure Chronic Diastolic Congestive Heart Failure Chronic Systolic & Diastolic Congestive Heart Failure Acute Systolic Congestive Heart Failure Acute Diastolic Congestive Heart Failure Acute Systolic & Diastolic Congestive Heart Failure Acute on Chronic Systolic Congestive Heart Failure Acute on Chronic Diastolic Congestive Heart Failure Acute on Chronic Systolic & Diastolic  Congestive Heart Failure Other Condition________________________________________ Cannot Clinically Determine  Supporting Information:  Risk Factors: AFIB, HTN, obesity, decondtioning  Signs & Symptoms:  Diagnostics: CXR 05/06/12 Grossly unchanged enlarged cardiac silhouette and prominence of the central pulmonary vasculature, nonspecific but suggestive of pulmonary arterial hypertension.  Further evaluation with cardiac echo may be performed as clinically indicated.  Treatment: Listed above  Reviewed:  no additional documentation provided  Thank You,  Enis Slipper  RN, BSN, MSN/Inf, CCDS Clinical Documentation Specialist [facility] Dept Pager: [phone] / E-mail: [email or web address]  Health Information Management [place]

## 2012-05-07 NOTE — Progress Notes (Signed)
ANTICOAGULATION CONSULT NOTE - Follow Up Consult  Pharmacy Consult for coumadin Indication: atrial fibrillation  Allergies  Allergen Reactions  . Penicillins     hives    Patient Measurements: Height: 5\' 11"  (180.3 cm) Weight: 166 lb 14.2 oz (75.7 kg) IBW/kg (Calculated) : 75.3  Heparin Dosing Weight:   Vital Signs: Temp: 98.1 F (36.7 C) (12/05 1200) Temp src: Oral (12/05 1200) BP: 137/76 mmHg (12/05 1200) Pulse Rate: 141  (12/05 1200)  Labs:  Basename 05/07/12 0330 05/06/12 1025 05/06/12 1012  HGB 13.4 14.5 --  HCT 39.7 42.6 --  PLT 139* 139* --  APTT 42* 56* --  LABPROT 19.9* 19.2* --  INR 1.76* 1.68* --  HEPARINUNFRC -- -- --  CREATININE 0.89 0.85 --  CKTOTAL -- -- --  CKMB -- -- --  TROPONINI -- -- <0.30    Estimated Creatinine Clearance: 74 ml/min (by C-G formula based on Cr of 0.89).   Medications:  Scheduled:    . [EXPIRED] albuterol      . bisoprolol  5 mg Oral Daily  . [COMPLETED] diltiazem  10 mg Intravenous Once  . doxazosin  4 mg Oral Daily  . dutasteride  0.5 mg Oral Daily  . gabapentin  600 mg Oral TID  . insulin aspart  0-20 Units Subcutaneous TID WC  . insulin aspart  0-5 Units Subcutaneous QHS  . ipratropium  0.5 mg Nebulization Q6H  . levalbuterol  0.63 mg Nebulization Q6H  . levofloxacin (LEVAQUIN) IV  750 mg Intravenous Q24H  . levofloxacin  500 mg Oral Daily  . methylPREDNISolone (SOLU-MEDROL) injection  60 mg Intravenous Q12H  . pantoprazole (PROTONIX) IV  40 mg Intravenous Q24H  . potassium chloride  40 mEq Oral BID  . [COMPLETED] general admission iv infusion   Intravenous Once  . [COMPLETED] warfarin  5 mg Oral ONCE-1800  . Warfarin - Pharmacist Dosing Inpatient   Does not apply q1800  . [DISCONTINUED] albuterol  2.5 mg Nebulization Q6H  . [DISCONTINUED] albuterol  2.5 mg Nebulization Q6H  . [DISCONTINUED] gabapentin  600 mg Oral TID  . [DISCONTINUED] insulin aspart  0-20 Units Subcutaneous Q4H  . [DISCONTINUED] ipratropium   0.5 mg Nebulization Q6H  . [DISCONTINUED] methylPREDNISolone (SOLU-MEDROL) injection  60 mg Intravenous Q6H    Assessment: 79 YOM admitted with cough, SOB, and wheezing. Patient on warfarin therapy for h/o afib and CVA. INR subtherapeutic at admission.   Home dose 5mg  on Mon, 2.5mg  other days  Hgb WNL, platelets slightly decreased at 139  INR = 1.76 following 5mg  coumadin last PM  Potential drug interactions with warfarin (potentiate effect): levofloxacin  Goal of Therapy:  INR 2-3 Monitor platelets by anticoagulation protocol: Yes   Plan:  - INR remains subtherapeutic, expect INR to continue to trend up following 5mg  dose last pm. Give coumadin 4mg  PO tonight - Daily INR  Dannielle Huh 05/07/2012,2:09 PM

## 2012-05-07 NOTE — Progress Notes (Signed)
This patient is receiving IV Protonix. Based on criteria approved by the Pharmacy and Therapeutics Committee, this medication is being converted to the equivalent oral dose form. These criteria include:   . The patient is eating (either orally or per tube) and/or has been taking other orally administered medications for at least 24 hours.  . This patient has no evidence of active gastrointestinal bleeding or impaired GI absorption (gastrectomy, short bowel, patient on TNA or NPO).   If you have questions about this conversion, please contact the pharmacy department.  Dannielle Huh, [facility] 05/07/2012 2:36 PM

## 2012-05-07 NOTE — Progress Notes (Signed)
eLink Physician-Brief Progress Note Patient Name: KIMONI PAGLIARULO DOB: August 15, 1932 MRN: [id number]  Date of Service  05/07/2012   HPI/Events of Note   Hyperglycemia on steroids  eICU Interventions  Start SSI protocol, q4h since NPO   Intervention Category Intermediate Interventions: Hyperglycemia - evaluation and treatment  Krystopher Kuenzel S. 05/07/2012, 12:27 AM

## 2012-05-07 NOTE — Progress Notes (Signed)
INITIAL ADULT NUTRITION ASSESSMENT Date: 05/07/2012   Time: 1:28 PM Reason for Assessment: MST  ASSESSMENT: Male 78 y.o.  Dx: respiratory failure  Hx:  Past Medical History  Diagnosis Date  . History of upper gastrointestinal hemorrhage   . Cerebrovascular accident   . Gastric polyp   . Dysphagia, unspecified   . Coronary artery disease     nonobstructive by cath 2009  . Permanent atrial fibrillation   . Hypertension   . Esophagitis   . GERD (gastroesophageal reflux disease)   . Erosive gastritis   . FH: colonic polyps   . Gastric ulcer   . Hiatal hernia   . Internal hemorrhoids    Past Surgical History  Procedure Date  . Cholecystectomy   . Asd repair [id number]  . Inguinal hernia repair 1963  . Partial small bowel resection 2005    ischemic?    Related Meds:  Scheduled Meds:   . [EXPIRED] albuterol      . bisoprolol  5 mg Oral Daily  . [COMPLETED] diltiazem  10 mg Intravenous Once  . doxazosin  4 mg Oral Daily  . dutasteride  0.5 mg Oral Daily  . gabapentin  600 mg Oral TID  . insulin aspart  0-20 Units Subcutaneous TID WC  . insulin aspart  0-5 Units Subcutaneous QHS  . ipratropium  0.5 mg Nebulization Q6H  . levalbuterol  0.63 mg Nebulization Q6H  . levofloxacin (LEVAQUIN) IV  750 mg Intravenous Q24H  . levofloxacin  500 mg Oral Daily  . methylPREDNISolone (SOLU-MEDROL) injection  60 mg Intravenous Q12H  . pantoprazole (PROTONIX) IV  40 mg Intravenous Q24H  . potassium chloride  40 mEq Oral BID  . [COMPLETED] general admission iv infusion   Intravenous Once  . [COMPLETED] warfarin  5 mg Oral ONCE-1800  . Warfarin - Pharmacist Dosing Inpatient   Does not apply q1800  . [DISCONTINUED] albuterol  2.5 mg Nebulization Q6H  . [DISCONTINUED] albuterol  2.5 mg Nebulization Q6H  . [DISCONTINUED] gabapentin  600 mg Oral TID  . [DISCONTINUED] insulin aspart  0-20 Units Subcutaneous Q4H  . [DISCONTINUED] ipratropium  0.5 mg Nebulization Q6H  . [DISCONTINUED]  methylPREDNISolone (SOLU-MEDROL) injection  60 mg Intravenous Q6H   Continuous Infusions:   . diltiazem (CARDIZEM) infusion 10 mg/hr (05/07/12 1235)   PRN Meds:.diltiazem, metoprolol   Ht: 5\' 11"  (180.3 cm)  Wt: 166 lb 14.2 oz (75.7 kg)  Ideal Wt: 78.1 kg % Ideal Wt: 96%  Usual Wt: 180-185 lbs % Usual Wt: 92%  Body mass index is 23.28 kg/(m^2).  Food/Nutrition Related Hx: poor appetite PTA  Labs:  CMP     Component Value Date/Time   NA 134* 05/07/2012 0330   K 3.1* 05/07/2012 0330   CL 96 05/07/2012 0330   CO2 25 05/07/2012 0330   GLUCOSE 185* 05/07/2012 0330   BUN 25* 05/07/2012 0330   CREATININE 0.89 05/07/2012 0330   CALCIUM 8.9 05/07/2012 0330   PROT 7.2 05/06/2012 1025   ALBUMIN 3.2* 05/06/2012 1025   AST 27 05/06/2012 1025   ALT 16 05/06/2012 1025   ALKPHOS 54 05/06/2012 1025   BILITOT 1.5* 05/06/2012 1025   GFRNONAA 80* 05/07/2012 0330   GFRAA >90 05/07/2012 0330    Intake: 0%  Output:   Intake/Output Summary (Last 24 hours) at 05/07/12 1332 Last data filed at 05/07/12 1100  Gross per 24 hour  Intake    900 ml  Output   1225 ml  Net   -325  ml    Diet Order: NPO  Supplements/Tube Feeding: none at this time  IVF:    diltiazem (CARDIZEM) infusion Last Rate: 10 mg/hr (05/07/12 1235)    Estimated Nutritional Needs:   Kcal: 9604-5409 Protein: 90-105g Fluid: >2.2 L/day  Pt admitted with respiratory failure.  Pt reports chronic poor appetite at home.  Pt states his typical intake is comprised of snacking.  Pt states she doesn't really eat meals, but is more likely to snack on crackers.   Pt lives at home with wife. Denies trying to lose wt although he has lost ~20 lbs over the past year (12% in one year). Reports hunger. Has a h/o dysphagia and pt endorses coughing with meals.  Per MD note, plan to address once off Ace-inhibitors for 4-6 weeks.  NUTRITION DIAGNOSIS: Unintended wt loss  RELATED TO: poor appetite  AS EVIDENCE BY: pt with 12% wt loss over  the past year  MONITORING/EVALUATION(Goals): 1.  Food/Beverage; diet advancement with tolerance 2.  Head/neck; no s/s aspiration  EDUCATION NEEDS: -Education needs addressed  INTERVENTION: 1.  Modify diet; as medically appropriate per MD discretion   DOCUMENTATION CODES Per approved criteria  -Not Applicable    Loyce Dys, MS RD LDN Clinical Inpatient Dietitian Pager: [phone] Weekend/After hours pager: [phone]

## 2012-05-07 NOTE — Progress Notes (Addendum)
PULMONARY  / CRITICAL CARE MEDICINE  Name: [name] MRN: [id number] DOB: [birth year]    LOS: 1  REFERRING MD :  Rito Ehrlich   CHIEF COMPLAINT:  resp failure   BRIEF PATIENT DESCRIPTION:  78 yowm remote smoker maintained on ACEI chronically admitted on 12/4 w/ acute respiratory failure in setting of presumed bronchitis   SIGNIFICANT EVENTS:    LINES / TUBES:   CULTURES: bcx 2 12/4>>> UC 12/4>>>   ANTIBIOTICS: levaquin 12/4>>>    LEVEL OF CARE:  SDU PRIMARY SERVICE:  PCCM CONSULTANTS:   CODE STATUS: limited, intubation, pressors and antiarrhythmics  DIET:  NPO  DVT Px:  Coumadin  GI Px:  PPI   INTERVAL HISTORY:  Now comfortable on [place] VITAL SIGNS: Temp:  [97.8 F (36.6 C)-98.6 F (37 C)] 98.6 F (37 C) (12/05 0800) Pulse Rate:  [95-147] 127  (12/05 1000) Resp:  [20-28] 24  (12/05 1000) BP: (92-136)/(39-90) 136/74 mmHg (12/05 1011) SpO2:  [91 %-97 %] 93 % (12/05 1000) Weight:  [75.7 kg (166 lb 14.2 oz)-78.5 kg (173 lb 1 oz)] 75.7 kg (166 lb 14.2 oz) (12/05 0500) 2 liters  HEMODYNAMICS:   VENTILATOR SETTINGS:   INTAKE / OUTPUT: Intake/Output      12/04 0701 - 12/05 0700 12/05 0701 - 12/06 0700   I.V. (mL/kg) 975 (12.9)    Total Intake(mL/kg) 975 (12.9)    Urine (mL/kg/hr) 700 (0.4) 275   Total Output 700 275   Net +275 -275          PHYSICAL EXAMINATION: General:  Elderly male currently resting comfortably  Neuro:  Awake, oriented, no focal def  HEENT:  [place], no JVD, left facial droop (h/o bells palsy) Cardiovascular:  rrr Lungs:  Distant expiratory wheeze, scattered rhonchi Abdomen:  Soft non-tender  Musculoskeletal:  Intact  Skin:  Intact, multiple areas of ecchymosis    LABS: Cbc  Lab 05/07/12 0330 05/06/12 1025  WBC 8.8 --  HGB 13.4 14.5  HCT 39.7 42.6  PLT 139* 139*    Chemistry   Lab 05/07/12 0330 05/06/12 1025  NA 134* 133*  K 3.1* 3.4*  CL 96 93*  CO2 25 23  BUN 25* 20  CREATININE 0.89 0.85  CALCIUM 8.9 9.2  MG -- --   PHOS -- --  GLUCOSE 185* 118*    Liver fxn  Lab 05/06/12 1025  AST 27  ALT 16  ALKPHOS 54  BILITOT 1.5*  PROT 7.2  ALBUMIN 3.2*   coags  Lab 05/07/12 0330 05/06/12 1025  APTT 42* 56*  INR 1.76* 1.68*   Sepsis markers  Lab 05/06/12 1025  LATICACIDVEN 1.7  PROCALCITON --   Cardiac markers  Lab 05/06/12 1012  CKTOTAL --  CKMB --  TROPONINI <0.30   BNP No results found for this basename: PROBNP:3 in the last 168 hours ABG No results found for this basename: PHART:3,PCO2ART:3,PO2ART:3,HCO3:3,TCO2:3 in the last 168 hours  CBG trend  Lab 05/07/12 0747 05/07/12 0323 05/07/12 0001 05/06/12 2043  GLUCAP 150* 187* 200* 217*    IMAGING: PCXR: chronic interstitial markings c/w fibrotic changes.  ECG:  DIAGNOSES: Active Problems:  Atrial fibrillation with RVR  Acute respiratory failure  Dysphagia  HTN (hypertension)  H/O: CVA (cerebrovascular accident)  GERD (gastroesophageal reflux disease)  Purulent bronchitis   ASSESSMENT / PLAN:  PULMONARY  ASSESSMENT: Acute respiratory failure in setting of purulent bronchitis superimposed on what looks like chronic interstitial lung disease.  Stopped smoking in 1980s, could be element of  COPD but unlikely. He has h/o CVA so chronic aspiration could certainly be a consideration. Also possibly related to ace-I  PLAN:   BIPAP PRN Scheduled nebs Systemic steroids-->start wean Empiric abx D/c acei for now.  CARDIOVASCULAR  ASSESSMENT:  afib w/ RVR  Seems to be aggravated by SABA PLAN:  Cont coumadin  Cont rate control, add low dose b blocker and hydration Change to xopenex Add bisoprolol  Begin dilt gtt 12/05   RENAL  ASSESSMENT:   hypokalemia PLAN:   Gentle hydration  Monitor and replace lytes as needed    GASTROINTESTINAL  ASSESSMENT:   GERD Dysphagia    Plan: SLP eval   HEMATOLOGIC  ASSESSMENT:   Chronic coumadin No evidence of bleeding  PLAN:  F/u INR Trend CBC    INFECTIOUS  ASSESSMENT:   Purulent bronchitis  PLAN:   See dash board   ENDOCRINE  ASSESSMENT:  Hyperglycemia  Aggravated by steroids   PLAN:   ssi  NEUROLOGIC  ASSESSMENT:   H/o cva  Bells palsy  PLAN:   Supportive   CLINICAL SUMMARY: 78 year old male w/ chronic dyspnea w/ CXR suggesting underlying chronic interstitial changes now being admitted for purulent bronchitis and resp distress ? What role acei having here?. Will treat w/ empiric abx, systemic steroids and BDs. Will need further eval of chronic dyspnea when better.   I have personally obtained a history, examined the patient, evaluated laboratory and imaging results, formulated the assessment and plan and placed orders. CRITICAL CARE: The patient is critically ill with multiple organ systems failure and requires high complexity decision making for assessment and support, frequent evaluation and titration of therapies, application of advanced monitoring technologies and extensive interpretation of multiple databases. Critical Care Time devoted to patient care services described in this note is  45 minutes.    Billy Fischer, MD ; [facility] [phone].  After 5:30 PM or weekends, call [phone]  05/07/2012, 10:23 AM

## 2012-05-08 DIAGNOSIS — J9801 Acute bronchospasm: Secondary | ICD-10-CM

## 2012-05-08 DIAGNOSIS — Z8673 Personal history of transient ischemic attack (TIA), and cerebral infarction without residual deficits: Secondary | ICD-10-CM

## 2012-05-08 LAB — COMPREHENSIVE METABOLIC PANEL
ALT: 21 U/L (ref 0–53)
BUN: 40 mg/dL — ABNORMAL HIGH (ref 6–23)
Calcium: 9.1 mg/dL (ref 8.4–10.5)
Creatinine, Ser: 0.97 mg/dL (ref 0.50–1.35)
GFR calc Af Amer: 90 mL/min — ABNORMAL LOW (ref >90)
Glucose, Bld: 183 mg/dL — ABNORMAL HIGH (ref 70–99)
Sodium: 137 mEq/L (ref 135–145)
Total Protein: 6.2 g/dL (ref 6.0–8.3)

## 2012-05-08 LAB — CBC
Hemoglobin: 12.6 g/dL — ABNORMAL LOW (ref 13.0–17.0)
MCH: 29.6 pg (ref 26.0–34.0)
MCHC: 33.1 g/dL (ref 30.0–36.0)
MCV: 89.6 fL (ref 78.0–100.0)

## 2012-05-08 LAB — GLUCOSE, CAPILLARY
Glucose-Capillary: 173 mg/dL — ABNORMAL HIGH (ref 70–99)
Glucose-Capillary: 151 mg/dL — ABNORMAL HIGH (ref 70–99)

## 2012-05-08 LAB — PROTIME-INR: Prothrombin Time: 20.6 seconds — ABNORMAL HIGH (ref 11.6–15.2)

## 2012-05-08 LAB — FIBRINOGEN: Fibrinogen: 333 mg/dL (ref 204–475)

## 2012-05-08 MED ORDER — DILTIAZEM HCL 30 MG PO TABS
30.0000 mg | ORAL_TABLET | Freq: Four times a day (QID) | ORAL | Status: DC
  Administered 2012-05-08 – 2012-05-10 (×8): 30 mg via ORAL
  Filled 2012-05-08 (×13): qty 1

## 2012-05-08 MED ORDER — FUROSEMIDE 10 MG/ML IJ SOLN
40.0000 mg | Freq: Once | INTRAMUSCULAR | Status: AC
  Administered 2012-05-08: 40 mg via INTRAVENOUS
  Filled 2012-05-08: qty 4

## 2012-05-08 MED ORDER — PREDNISONE 20 MG PO TABS
40.0000 mg | ORAL_TABLET | Freq: Every day | ORAL | Status: DC
  Administered 2012-05-09 – 2012-05-15 (×7): 40 mg via ORAL
  Filled 2012-05-08 (×8): qty 2

## 2012-05-08 MED ORDER — DILTIAZEM HCL 100 MG IV SOLR
5.0000 mg/h | INTRAVENOUS | Status: DC
  Filled 2012-05-08: qty 100

## 2012-05-08 MED ORDER — WARFARIN SODIUM 4 MG PO TABS
4.0000 mg | ORAL_TABLET | Freq: Once | ORAL | Status: AC
  Administered 2012-05-08: 4 mg via ORAL
  Filled 2012-05-08: qty 1

## 2012-05-08 NOTE — Evaluation (Signed)
Clinical/Bedside Swallow Evaluation Patient Details  Name: [name] MRN: [id number] Date of Birth: 1933-05-21  Today's Date: 05/08/2012 Time: 4098-1191 SLP Time Calculation (min): 50 min  Past Medical History:  Past Medical History  Diagnosis Date  . History of upper gastrointestinal hemorrhage   . Cerebrovascular accident   . Gastric polyp   . Dysphagia, unspecified   . Coronary artery disease     nonobstructive by cath 2009  . Permanent atrial fibrillation   . Hypertension   . Esophagitis   . GERD (gastroesophageal reflux disease)   . Erosive gastritis   . FH: colonic polyps   . Gastric ulcer   . Hiatal hernia   . Internal hemorrhoids    Past Surgical History:  Past Surgical History  Procedure Date  . Cholecystectomy   . Asd repair [id number]  . Inguinal hernia repair 1963  . Partial small bowel resection 2005    ischemic?   HPI:  78 yo male adm to [facility] with acute respiratory failure - presumed bronchitis- requiring BiPap prn.  Pt PMH + for GERD, hiatal hernia, dysphagia, right side pons CVA and ? Bells palsy impacting left side of face in Oct 2003.  Pt and spouse deny CVA stating they have received conflicting information re: actual diagnosis.  Pt is kyphotic, CXR 12/4 showed no focal infiltrates and hiatal hernia.  PMH also + for remote smoker, afib RVR, HTN, bronchitis, chronic interstial lung disease.      Assessment / Plan / Recommendation Clinical Impression  Pt observed consuming approx 3 ounces water, 5 bites of cracker, and 5 bites of jello without clinical indications of aspiration.  Swallow appeared timely with clear voice throughout.  Pt did tend to talk while food was in his mouth and his dentures are ill fitting- flopping as he is attempting to masticate his food.  SLP and pt,family had a lengthy discussion regarding pt's known GERD, respiratory condition and ways to maximize airway protection.  Wife present and acknowledged that patient eats fast and needs to  slow down.  Pt acknowledges h/o of reflux but denies recent symptoms.  He also states he has been "choking a little" on food within the last few months- wife admits this can occur with drinks as well.  Pt has also complained of "thick saliva" recently but consumes adequate amounts of water per family.    Report of increased problems with swallowing coke versus water and cornbread verbalized by pt.   Pt had developed an elaborate means of taking medication:  he first consumes cracker, then swallows pill with water, followed by more cracker to clear the throat.  This modification indicates chronic dysphagia that pt is managing.  Suspect pt has been having some episodic aspiration prior to admission.  Advised to monitor breathing closely with intake and take rest breaks if dyspneic, to eat several small meals/day, not talk while eating! and follow reflux precautions.   Further advised spouse to learn heimlich for emergent use.    Will follow up x 1 if pt remains next week to assure all education completed.  Family and pt expressed gratitude for information.     Aspiration Risk  Moderate    Diet Recommendation Regular;Thin liquid   Liquid Administration via: Cup Medication Administration: Other (Comment) (defer to pt, he states capsules are easy-prob w/tablets) Compensations: Slow rate;Small sips/bites Postural Changes and/or Swallow Maneuvers: Seated upright 90 degrees;Upright 30-60 min after meal    Other  Recommendations Oral Care Recommendations: Oral care BID  Follow Up Recommendations       Frequency and Duration min 1 x/week  1 week   Pertinent Vitals/Pain Afebrile, rhonchi-wheeze, at times tachypnea    SLP Swallow Goals Patient will utilize recommended strategies during swallow to increase swallowing safety with: Modified independent assistance   Swallow Study Prior Functional Status   fed self at home, some dysphagia prior to admission - reported over a few mos-suspect longer  based on discussion    General Date of Onset: 05/08/12 HPI: 78 yo male adm to [facility] with acute respiratory failure - presumed bronchitis- requiring BiPap prn.  Pt PMH + for GERD, hiatal hernia, dysphagia, right side pons CVA and ? Bells palsy impacting left side of face in Oct 2003.  Pt and spouse deny CVA stating they have received conflicting information re: actual diagnosis.  Pt is kyphotic, CXR 12/4 showed no focal infiltrates and hiatal hernia.  PMH also + for remote smoker, afib RVR, HTN, bronchitis, chronic interstial lung disease.    Type of Study: Bedside swallow evaluation Previous Swallow Assessment: none Diet Prior to this Study: Regular;Thin liquids Temperature Spikes Noted: No Respiratory Status: Supplemental O2 delivered via (comment) (6 liters, pt not on oxygen at home per spouse) History of Recent Intubation: No Behavior/Cognition: Alert;Cooperative;Pleasant mood Oral Cavity - Dentition: Dentures, top;Dentures, bottom (ill fitting-but pt does not like to use adhesive) Self-Feeding Abilities: Able to feed self Patient Positioning: Upright in bed Baseline Vocal Quality: Clear Volitional Cough: Strong (pt reports being unable to get secretions up) Volitional Swallow: Able to elicit    Oral/Motor/Sensory Function Overall Oral Motor/Sensory Function: Impaired at baseline (bell's palsy and previous cva) Labial ROM: Reduced left Labial Symmetry: Abnormal symmetry left Labial Strength: Within Functional Limits Lingual ROM: Within Functional Limits Lingual Symmetry: Within Functional Limits Lingual Strength: Reduced Facial ROM: Reduced left Facial Symmetry: Left droop   Ice Chips Ice chips: Not tested   Thin Liquid Thin Liquid: Within functional limits Presentation: Cup;Self Fed;Straw    Nectar Thick Nectar Thick Liquid: Not tested   Honey Thick Honey Thick Liquid: Not tested   Puree Puree: Within functional limits Presentation: Self Fed;Spoon   Solid   GO    Solid:  Within functional limits Presentation: Self Lisabeth Pick, MS [facility] SLP [phone]

## 2012-05-08 NOTE — Progress Notes (Signed)
Placed patient on BiPAP, 10/6 and 40% due to increased RR and patient increased WOB. Patient tolerating well and states that he feels that he is breathing better. RT will monitor.

## 2012-05-08 NOTE — Progress Notes (Signed)
ANTICOAGULATION CONSULT NOTE - Follow Up Consult  Pharmacy Consult for warfarin Indication: atrial fibrillation  Allergies  Allergen Reactions  . Penicillins     hives    Patient Measurements: Height: 5\' 11"  (180.3 cm) Weight: 169 lb 5 oz (76.8 kg) IBW/kg (Calculated) : 75.3   Vital Signs: Temp: 98.5 F (36.9 C) (12/06 0400) Temp src: Oral (12/06 0400) BP: 123/65 mmHg (12/06 0600) Pulse Rate: 71  (12/06 0600)  Labs:  Basename 05/08/12 0340 05/07/12 0330 05/06/12 1025 05/06/12 1012  HGB 12.6* 13.4 -- --  HCT 38.1* 39.7 42.6 --  PLT 165 139* 139* --  APTT -- 42* 56* --  LABPROT 20.6* 19.9* 19.2* --  INR 1.84* 1.76* 1.68* --  HEPARINUNFRC -- -- -- --  CREATININE 0.97 0.89 0.85 --  CKTOTAL -- -- -- --  CKMB -- -- -- --  TROPONINI -- -- -- <0.30    Estimated Creatinine Clearance: 67.9 ml/min (by C-G formula based on Cr of 0.97).   Medications:  Scheduled:     . bisoprolol  5 mg Oral Daily  . [COMPLETED] diltiazem  10 mg Intravenous Once  . doxazosin  4 mg Oral Daily  . dutasteride  0.5 mg Oral Daily  . gabapentin  600 mg Oral TID  . insulin aspart  0-20 Units Subcutaneous TID WC  . insulin aspart  0-5 Units Subcutaneous QHS  . ipratropium  0.5 mg Nebulization Q6H  . levalbuterol  0.63 mg Nebulization Q6H  . levofloxacin (LEVAQUIN) IV  750 mg Intravenous Q24H  . levofloxacin  500 mg Oral Daily  . methylPREDNISolone (SOLU-MEDROL) injection  60 mg Intravenous Q12H  . pantoprazole  40 mg Oral Daily  . [COMPLETED] potassium chloride  40 mEq Oral BID  . [COMPLETED] warfarin  4 mg Oral ONCE-1800  . Warfarin - Pharmacist Dosing Inpatient   Does not apply q1800  . [DISCONTINUED] albuterol  2.5 mg Nebulization Q6H  . [DISCONTINUED] insulin aspart  0-20 Units Subcutaneous Q4H  . [DISCONTINUED] methylPREDNISolone (SOLU-MEDROL) injection  60 mg Intravenous Q6H  . [DISCONTINUED] pantoprazole (PROTONIX) IV  40 mg Intravenous Q24H    Assessment: 79 YOM admitted with  cough, SOB, and wheezing. Patient on warfarin therapy for h/o afib and CVA. INR subtherapeutic at admission.  Home dose 5mg  on Mon, 2.5mg  other days  Hgb slightly decreased, Plt improved to 165  INR = 1.84 following boosted doses 12/4 and 12/5  Potential drug interactions with warfarin (potentiate effect): levofloxacin  Goal of Therapy:  INR 2-3 Monitor platelets by anticoagulation protocol: Yes   Plan:   Warfarin 4mg  PO tonight at 1800  Daily INR  Lynann Beaver PharmD, BCPS Pager [phone] 05/08/2012 7:11 AM

## 2012-05-08 NOTE — Progress Notes (Signed)
PULMONARY  / CRITICAL CARE MEDICINE  Name: [name] MRN: [id number] DOB: [birth year]    LOS: 2  REFERRING MD :  Rito Ehrlich   CHIEF COMPLAINT:  resp failure   BRIEF PATIENT DESCRIPTION:  41 yowm remote smoker maintained on ACEI chronically admitted on 12/4 w/ acute respiratory failure in setting of presumed bronchitis   SIGNIFICANT EVENTS:      CULTURES: bcx 2 12/4>>> UC 12/4>>>neg    ANTIBIOTICS: levaquin 12/4>>>  LEVEL OF CARE:  SDU PRIMARY SERVICE:  PCCM CONSULTANTS:   CODE STATUS: limited, intubation, pressors and antiarrhythmics  DIET:  NPO  DVT Px:  Coumadin  GI Px:  PPI   INTERVAL HISTORY:  Now comfortable on [place] VITAL SIGNS: Temp:  [97.4 F (36.3 C)-98.9 F (37.2 C)] 97.6 F (36.4 C) (12/06 0800) Pulse Rate:  [68-141] 71  (12/06 0600) Resp:  [22-31] 26  (12/06 0600) BP: (85-137)/(54-96) 123/65 mmHg (12/06 0600) SpO2:  [90 %-98 %] 96 % (12/06 0600) FiO2 (%):  [40 %] 40 % (12/06 0000) Weight:  [76.8 kg (169 lb 5 oz)] 76.8 kg (169 lb 5 oz) (12/06 0000) 6 liters  HEMODYNAMICS:   VENTILATOR SETTINGS: Vent Mode:  [-]  FiO2 (%):  [40 %] 40 % INTAKE / OUTPUT: Intake/Output      12/05 0701 - 12/06 0700 12/06 0701 - 12/07 0700   P.O. 480 120   I.V. (mL/kg) 130 (1.7)    Total Intake(mL/kg) 610 (7.9) 120 (1.6)   Urine (mL/kg/hr) 1275 (0.7)    Total Output 1275    Net -665 +120        Urine Occurrence 1 x    Stool Occurrence 1 x      PHYSICAL EXAMINATION: General:  Elderly male currently resting comfortably  Neuro:  Awake, oriented, no focal def  HEENT:  [place], no JVD, left facial droop (h/o bells palsy) Cardiovascular:  rrr Lungs:  Distant expiratory wheeze, scattered rhonchi and basilar rales.  Abdomen:  Soft non-tender  Musculoskeletal:  Intact  Skin:  Intact, multiple areas of ecchymosis    LABS: Cbc  Lab 05/08/12 0340 05/07/12 0330 05/06/12 1025  WBC 12.6* -- --  HGB 12.6* 13.4 14.5  HCT 38.1* 39.7 42.6  PLT 165 139* 139*     Chemistry   Lab 05/08/12 0340 05/07/12 0330 05/06/12 1025  NA 137 134* 133*  K 5.1 3.1* 3.4*  CL 102 96 93*  CO2 27 25 23   BUN 40* 25* 20  CREATININE 0.97 0.89 0.85  CALCIUM 9.1 8.9 9.2  MG -- -- --  PHOS -- -- --  GLUCOSE 183* 185* 118*    Liver fxn  Lab 05/08/12 0340 05/06/12 1025  AST 32 27  ALT 21 16  ALKPHOS 51 54  BILITOT 0.4 1.5*  PROT 6.2 7.2  ALBUMIN 2.7* 3.2*   coags  Lab 05/08/12 0340 05/07/12 0330 05/06/12 1025  APTT -- 42* 56*  INR 1.84* 1.76* 1.68*   Sepsis markers  Lab 05/06/12 1025  LATICACIDVEN 1.7  PROCALCITON --   Cardiac markers  Lab 05/06/12 1012  CKTOTAL --  CKMB --  TROPONINI <0.30   BNP No results found for this basename: PROBNP:3 in the last 168 hours ABG No results found for this basename: PHART:3,PCO2ART:3,PO2ART:3,HCO3:3,TCO2:3 in the last 168 hours  CBG trend  Lab 05/08/12 0814 05/07/12 2245 05/07/12 2030 05/07/12 1636 05/07/12 1223  GLUCAP 173* 153* 145* 226* 161*    IMAGING: PCXR: chronic interstitial markings c/w fibrotic changes.  ECG:  DIAGNOSES: Active Problems:  Atrial fibrillation with RVR  Acute respiratory failure  Dysphagia  HTN (hypertension)  H/O: CVA (cerebrovascular accident)  GERD (gastroesophageal reflux disease)  Purulent bronchitis   ASSESSMENT / PLAN:  1) Acute respiratory failure in setting of purulent bronchitis superimposed on what looks like chronic interstitial lung disease.  Stopped smoking in 1980s, could be element of COPD but unlikely. He has h/o CVA so chronic aspiration could certainly be a consideration. Also possibly related to ace-I. Feels Better on 12/6, but is on higher FIO2 2-->6 liters, suspect element of volume excess.  PLAN:   Scheduled nebs Systemic steroids-->wean to pred and taper to off.  Empiric abx D/c acei for now. Lasix x 1   2) afib w/ RVR  Seems to be aggravated by SABA. Placed on dilt gtt 12/5. Now rate controlled as of 12/6.  PLAN:  Cont coumadin   Changed to xopenex Added bisoprolol  Begin dilt gtt 12/05-->transition to po 12/6  3) Mild hyperkalemia  PLAN:   Monitor and replace lytes as needed  1 X lasix    4) GERD & Dysphagia   Plan: SLP eval   5) Hyperglycemia  Aggravated by steroids   PLAN:   ssi Wean steroids   6) H/o cva  & Bells palsy  No acute issues PLAN:   Supportive     Billy Fischer, MD ; [facility] service Mobile [phone].  After 5:30 PM or weekends, call [phone]   05/08/2012, 10:14 AM

## 2012-05-08 NOTE — Progress Notes (Signed)
Inpatient Diabetes Program Recommendations  AACE/ADA: New Consensus Statement on Inpatient Glycemic Control (2013)  Target Ranges:  Prepandial:   less than 140 mg/dL      Peak postprandial:   less than 180 mg/dL (1-2 hours)      Critically ill patients:  140 - 180 mg/dL   Reason for Assessment:  Hyperglycemia  78 year old WM admitted on 12/4 w/ acute respiratory failure in setting of presumed bronchitis.  No hx DM.    Results for ALIZE, ACY (MRN [id number]) as of 05/08/2012 10:48  Ref. Range 05/08/2012 03:40  Sodium Latest Range: 135-145 mEq/L 137  Potassium Latest Range: 3.5-5.1 mEq/L 5.1  Chloride Latest Range: 96-112 mEq/L 102  CO2 Latest Range: 19-32 mEq/L 27  BUN Latest Range: 6-23 mg/dL 40 (H)  Creatinine Latest Range: 0.50-1.35 mg/dL 7.82  Calcium Latest Range: 8.4-10.5 mg/dL 9.1  GFR calc non Af Amer Latest Range: >90 mL/min 78 (L)  GFR calc Af Amer Latest Range: >90 mL/min 90 (L)  Glucose Latest Range: 70-99 mg/dL 956 (H)  Results for ASHAD, FAWBUSH (MRN [id number]) as of 05/08/2012 10:48  Ref. Range 05/07/2012 12:23 05/07/2012 16:36 05/07/2012 20:30 05/07/2012 22:45 05/08/2012 08:14  Glucose-Capillary Latest Range: 70-99 mg/dL 469 (H) 629 (H) 528 (H) 153 (H) 173 (H)    Results for PAZ, WINSETT (MRN [id number]) as of 05/08/2012 10:48  Ref. Range 05/06/2012 10:25 05/07/2012 03:30 05/08/2012 03:40  Glucose Latest Range: 70-99 mg/dL 272 (H) 536 (H) 644 (H)    FBS remain elevated.  Please consider adding Lantus 8 units QHS. Needs updated HgbA1C to assess glycemic control prior to hospitalization.  Will continue to follow.  Thank you.  Ailene Ards, RD, LDN, CDE Inpatient Diabetes Coordinator [phone]

## 2012-05-09 ENCOUNTER — Inpatient Hospital Stay (HOSPITAL_COMMUNITY): Payer: Medicare Other

## 2012-05-09 DIAGNOSIS — J189 Pneumonia, unspecified organism: Principal | ICD-10-CM

## 2012-05-09 LAB — GLUCOSE, CAPILLARY
Glucose-Capillary: 153 mg/dL — ABNORMAL HIGH (ref 70–99)
Glucose-Capillary: 165 mg/dL — ABNORMAL HIGH (ref 70–99)
Glucose-Capillary: 134 mg/dL — ABNORMAL HIGH (ref 70–99)

## 2012-05-09 LAB — CBC
HCT: 38.7 % — ABNORMAL LOW (ref 39.0–52.0)
MCH: 29.5 pg (ref 26.0–34.0)
MCV: 90.6 fL (ref 78.0–100.0)
Platelets: 172 10*3/uL (ref 150–400)
RDW: 13.3 % (ref 11.5–15.5)
WBC: 11.1 10*3/uL — ABNORMAL HIGH (ref 4.0–10.5)

## 2012-05-09 MED ORDER — WARFARIN SODIUM 2.5 MG PO TABS
2.5000 mg | ORAL_TABLET | Freq: Once | ORAL | Status: AC
  Administered 2012-05-09: 2.5 mg via ORAL
  Filled 2012-05-09: qty 1

## 2012-05-09 NOTE — Progress Notes (Signed)
eLink Physician-Brief Progress Note Patient Name: [name] DOB: [birth year] MRN: [id number]  Date of Service  05/09/2012   HPI/Events of Note   2s pause; the patient known with AFib, currently on bisoprolol and diltiazem; on chronic anticoagulation.   eICU Interventions  We will continue to monitor on telemetry; is this event is not singular we will inform cardiology and decrease or stop the bisoprolol and cardizem      [facility] 05/09/2012, 8:03 PM

## 2012-05-09 NOTE — Progress Notes (Signed)
Patient states feels no better than yesterday.  Visibly having difficulty breathing post breathing treatment.  On 5.5L [place].

## 2012-05-09 NOTE — Progress Notes (Signed)
Patients seems to be breathing better this afternoon and visiting with guests.

## 2012-05-09 NOTE — Progress Notes (Signed)
ANTICOAGULATION CONSULT NOTE - Follow Up Consult  Pharmacy Consult for warfarin Indication: atrial fibrillation  Allergies  Allergen Reactions  . Penicillins     hives    Patient Measurements: Height: 5\' 11"  (180.3 cm) Weight: 168 lb 3.4 oz (76.3 kg) IBW/kg (Calculated) : 75.3   Vital Signs: Temp: 97.5 F (36.4 C) (12/07 0554) Temp src: Oral (12/07 0554) BP: 140/60 mmHg (12/07 0554) Pulse Rate: 95  (12/07 0554)  Labs:  Basename 05/09/12 0610 05/08/12 0340 05/07/12 0330 05/06/12 1025 05/06/12 1012  HGB 12.6* 12.6* -- -- --  HCT 38.7* 38.1* 39.7 -- --  PLT 172 165 139* -- --  APTT -- -- 42* 56* --  LABPROT 26.4* 20.6* 19.9* -- --  INR 2.58* 1.84* 1.76* -- --  HEPARINUNFRC -- -- -- -- --  CREATININE -- 0.97 0.89 0.85 --  CKTOTAL -- -- -- -- --  CKMB -- -- -- -- --  TROPONINI -- -- -- -- <0.30    Estimated Creatinine Clearance: 67.9 ml/min (by C-G formula based on Cr of 0.97).   Medications:  Scheduled:     . bisoprolol  5 mg Oral Daily  . diltiazem  30 mg Oral Q6H  . doxazosin  4 mg Oral Daily  . dutasteride  0.5 mg Oral Daily  . [COMPLETED] furosemide  40 mg Intravenous Once  . gabapentin  600 mg Oral TID  . insulin aspart  0-20 Units Subcutaneous TID WC  . insulin aspart  0-5 Units Subcutaneous QHS  . ipratropium  0.5 mg Nebulization Q6H  . levalbuterol  0.63 mg Nebulization Q6H  . [COMPLETED] levofloxacin (LEVAQUIN) IV  750 mg Intravenous Q24H  . levofloxacin  500 mg Oral Daily  . pantoprazole  40 mg Oral Daily  . predniSONE  40 mg Oral Q breakfast  . [COMPLETED] warfarin  4 mg Oral ONCE-1800  . Warfarin - Pharmacist Dosing Inpatient   Does not apply q1800  . [DISCONTINUED] methylPREDNISolone (SOLU-MEDROL) injection  60 mg Intravenous Q12H    Assessment: 79 YOM admitted with cough, SOB, and wheezing. Patient on warfarin therapy for h/o afib and CVA. INR subtherapeutic at admission.  Home dose 5mg  on Mon, 2.5mg  other days  CBC stable  INR now  therapeutic (2.58) and rising following boosted doses 12/4 - 12/6  Potential drug interactions with warfarin (potentiate effect): levofloxacin  Goal of Therapy:  INR 2-3 Monitor platelets by anticoagulation protocol: Yes   Plan:   Decrease coumadin back to home dose 2.5mg   Daily INR  Loralee Pacas, PharmD, BCPS Pager: [phone] 05/09/2012 8:55 AM

## 2012-05-09 NOTE — Progress Notes (Signed)
PULMONARY  / CRITICAL CARE MEDICINE  Name: [name] MRN: [id number] DOB: 1933/04/28    LOS: 3  REFERRING MD :  Rito Ehrlich   CHIEF COMPLAINT:  resp failure   BRIEF PATIENT DESCRIPTION:  49 yowm remote smoker maintained on ACEI chronically admitted on 12/4 w/ acute respiratory failure in setting of presumed bronchitis   SIGNIFICANT EVENTS:      CULTURES: bcx 2 12/4 >>> UC 12/4>>>neg    ANTIBIOTICS: levaquin 12/4>>>  LEVEL OF CARE:  SDU PRIMARY SERVICE:  PCCM CONSULTANTS:  none CODE STATUS: limited, intubation, pressors and antiarrhythmics  DIET:  NPO  DVT Px:  Coumadin  GI Px:  PPI   INTERVAL HISTORY:  Now comfortable on [place]  VITAL SIGNS: Temp:  [97.5 F (36.4 C)-98.3 F (36.8 C)] 97.5 F (36.4 C) (12/07 0554) Pulse Rate:  [78-95] 95  (12/07 0554) Resp:  [21-24] 23  (12/07 0554) BP: (111-140)/(60-88) 140/60 mmHg (12/07 0554) SpO2:  [90 %-93 %] 93 % (12/07 0825) Weight:  [168 lb 3.4 oz (76.3 kg)-169 lb 5 oz (76.8 kg)] 168 lb 3.4 oz (76.3 kg) (12/07 0554) FIO2 6 liters     INTAKE / OUTPUT: Intake/Output      12/06 0701 - 12/07 0700 12/07 0701 - 12/08 0700   P.O. 120 120   I.V. (mL/kg) 30 (0.4)    Other 75    IV Piggyback 154    Total Intake(mL/kg) 379 (5) 120 (1.6)   Urine (mL/kg/hr) 1900 (1) 400 (0.7)   Total Output 1900 400   Net -1521 -280        Stool Occurrence 1 x      PHYSICAL EXAMINATION: General:  Elderly male currently resting comfortably  Neuro:  Awake, oriented, no focal def  HEENT:  , no JVD, left facial droop (h/o bells palsy) Cardiovascular:  rrr Lungs:  Distant expiratory wheeze, scattered rhonchi and basilar rales.  Abdomen:  Soft non-tender  Musculoskeletal:  Intact  Skin:  Intact, multiple areas of ecchymosis    LABS: Cbc  Lab 05/09/12 0610 05/08/12 0340 05/07/12 0330  WBC 11.1* -- --  HGB 12.6* 12.6* 13.4  HCT 38.7* 38.1* 39.7  PLT 172 165 139*    Chemistry   Lab 05/08/12 0340 05/07/12 0330 05/06/12 1025   NA 137 134* 133*  K 5.1 3.1* 3.4*  CL 102 96 93*  CO2 27 25 23   BUN 40* 25* 20  CREATININE 0.97 0.89 0.85  CALCIUM 9.1 8.9 9.2  MG -- -- --  PHOS -- -- --  GLUCOSE 183* 185* 118*    Liver fxn  Lab 05/08/12 0340 05/06/12 1025  AST 32 27  ALT 21 16  ALKPHOS 51 54  BILITOT 0.4 1.5*  PROT 6.2 7.2  ALBUMIN 2.7* 3.2*   coags  Lab 05/09/12 0610 05/08/12 0340 05/07/12 0330 05/06/12 1025  APTT -- -- 42* 56*  INR 2.58* 1.84* 1.76* --   Sepsis markers  Lab 05/06/12 1025  LATICACIDVEN 1.7  PROCALCITON --   Cardiac markers  Lab 05/06/12 1012  CKTOTAL --  CKMB --  TROPONINI <0.30   BNP No results found for this basename: PROBNP:3 in the last 168 hours ABG No results found for this basename: PHART:3,PCO2ART:3,PO2ART:3,HCO3:3,TCO2:3 in the last 168 hours  CBG trend  Lab 05/09/12 1143 05/09/12 0833 05/08/12 2202 05/08/12 1619 05/08/12 1219  GLUCAP 141* 153* 169* 193* 151*    IMAGING pcxr 12/7 The left retrocardiac density is concerning for pneumonia. Small  left effusion.  Left upper lobe and right basilar atelectasis.  Bilateral hilar fullness, right greater than left. Cannot exclude  adenopathy. s.     DIAGNOSES: Active Problems:  Atrial fibrillation with RVR  Acute respiratory failure  Dysphagia  HTN (hypertension)  H/O: CVA (cerebrovascular accident)  GERD (gastroesophageal reflux disease)  Purulent bronchitis   ASSESSMENT / PLAN:  1) Acute respiratory failure in setting of purulent bronchitis superimposed on what looks like chronic interstitial lung disease.  Stopped smoking in 1980s, could be element of COPD but unlikely. He has h/o CVA so chronic aspiration could certainly be a consideration. Also possibly related to ace-I. Feels Better on 12/6, but is on higher FIO2 2-->6 liters, suspect element of volume excess.   PLAN:   Scheduled nebs Systemic steroids-->wean to pred and taper to off.  Empiric abx D/c acei for now.     2) afib w/ RVR   - Echo 03/09/12 Left ventricle: The cavity size was normal. Wall thickness was normal. Systolic function was normal. The estimated ejection fraction was in the range of 50% to 55%. - Mitral valve: Mild regurgitation. - Left atrium: The atrium was mildly dilated. - Right ventricle: The cavity size was mildly dilated. Systolic function was mildly reduced. - Right atrium: The atrium was mildly dilated. - Pulmonary arteries: PA peak pressure: 34mm Hg   Seems to be aggravated by SABA. Placed on dilt gtt 12/5.  rate controlled as of 12/6.   PLAN:  Cont coumadin  Changed to xopenex Added bisoprolol   dilt gtt 12/05-->transition to po 12/6  3) Mild hyperkalemia  PLAN:   Monitor and replace lytes as needed  1 X lasix    4) GERD & Dysphagia   Plan: SLP eval   5) Hyperglycemia  Aggravated by steroids   PLAN:   ssi Wean steroids   6) H/o cva  & Bells palsy  No acute issues PLAN:   Supportive    Sandrea Hughs, MD Pulmonary and Critical Care Medicine [facility] Cell [phone]     05/09/2012, 2:22 PM

## 2012-05-09 NOTE — Progress Notes (Signed)
At 1856 patient had a 2.15 pause. Checked on patient who states he just got back to bed after sitting in the chair and didn't feel anything.  Paged Md and waiting for call back.  ECG placed on chart and report given to night nurse.

## 2012-05-10 LAB — GLUCOSE, CAPILLARY
Glucose-Capillary: 130 mg/dL — ABNORMAL HIGH (ref 70–99)
Glucose-Capillary: 152 mg/dL — ABNORMAL HIGH (ref 70–99)

## 2012-05-10 LAB — PROTIME-INR: INR: 3.03 — ABNORMAL HIGH (ref 0.00–1.49)

## 2012-05-10 MED ORDER — IPRATROPIUM BROMIDE 0.02 % IN SOLN
0.5000 mg | Freq: Four times a day (QID) | RESPIRATORY_TRACT | Status: DC
  Administered 2012-05-10 – 2012-05-15 (×20): 0.5 mg via RESPIRATORY_TRACT
  Filled 2012-05-10 (×21): qty 2.5

## 2012-05-10 MED ORDER — LEVALBUTEROL HCL 0.63 MG/3ML IN NEBU
0.6300 mg | INHALATION_SOLUTION | Freq: Four times a day (QID) | RESPIRATORY_TRACT | Status: DC
  Administered 2012-05-10 – 2012-05-15 (×20): 0.63 mg via RESPIRATORY_TRACT
  Filled 2012-05-10 (×24): qty 3

## 2012-05-10 NOTE — Progress Notes (Signed)
Cardiac monitoring cancelled by NP with am.

## 2012-05-10 NOTE — Progress Notes (Signed)
PULMONARY  / CRITICAL CARE MEDICINE  Name: [name] MRN: [id number] DOB: 03/16/1933    LOS: 4  REFERRING MD :  Rito Ehrlich   CHIEF COMPLAINT:  resp failure   BRIEF PATIENT DESCRIPTION:  68 yowm remote smoker maintained on ACEI chronically admitted on 12/4 w/ acute respiratory failure in setting of presumed bronchitis   SIGNIFICANT EVENTS:      CULTURES: MRSA screen 12/4 > neg UC 12/4>>>neg  bcx 2 12/4 >>>   ANTIBIOTICS: levaquin 12/4>>>  LEVEL OF CARE:  SDU PRIMARY SERVICE:  PCCM CONSULTANTS:  none CODE STATUS: limited, intubation, pressors and antiarrhythmics  DIET:  NPO  DVT Px:  Coumadin  GI Px:  PPI   INTERVAL HISTORY:    comfortable on [place] - tendency to bradycardia / pauses on BB and CCB noted overnight in CAF  VITAL SIGNS: Temp:  [97.9 F (36.6 C)] 97.9 F (36.6 C) (12/08 0702) Pulse Rate:  [94] 94  (12/08 0702) Resp:  [20] 20  (12/08 0702) BP: (124)/(86) 124/86 mmHg (12/08 0702) SpO2:  [92 %-98 %] 94 % (12/08 0901) Weight:  [169 lb 1.5 oz (76.7 kg)] 169 lb 1.5 oz (76.7 kg) (12/08 0654) FIO2 4 liters     INTAKE / OUTPUT: Intake/Output      12/07 0701 - 12/08 0700 12/08 0701 - 12/09 0700   P.O. 400 140   I.V. (mL/kg)     Other     IV Piggyback 10    Total Intake(mL/kg) 410 (5.3) 140 (1.8)   Urine (mL/kg/hr) 1000 (0.5) 200   Stool 2 0   Total Output 1002 200   Net -592 -60        Urine Occurrence  1 x   Stool Occurrence 1 x 1 x     PHYSICAL EXAMINATION: General:  Elderly male currently resting comfortably  Neuro:  Awake, oriented, no focal def  HEENT:  [place], no JVD, left facial droop (h/o bells palsy) Cardiovascular:  rrr Lungs:  Distant expiratory wheeze, scattered rhonchi and basilar rales.  Abdomen:  Soft non-tender  Musculoskeletal:  Intact  Skin:  Intact, multiple areas of ecchymosis    LABS: Cbc  Lab 05/09/12 0610 05/08/12 0340 05/07/12 0330  WBC 11.1* -- --  HGB 12.6* 12.6* 13.4  HCT 38.7* 38.1* 39.7  PLT 172 165 139*     Chemistry   Lab 05/08/12 0340 05/07/12 0330 05/06/12 1025  NA 137 134* 133*  K 5.1 3.1* 3.4*  CL 102 96 93*  CO2 27 25 23   BUN 40* 25* 20  CREATININE 0.97 0.89 0.85  CALCIUM 9.1 8.9 9.2  MG -- -- --  PHOS -- -- --  GLUCOSE 183* 185* 118*    Liver fxn  Lab 05/08/12 0340 05/06/12 1025  AST 32 27  ALT 21 16  ALKPHOS 51 54  BILITOT 0.4 1.5*  PROT 6.2 7.2  ALBUMIN 2.7* 3.2*   coags  Lab 05/10/12 0535 05/09/12 0610 05/08/12 0340 05/07/12 0330 05/06/12 1025  APTT -- -- -- 42* 56*  INR 3.03* 2.58* 1.84* -- --   Sepsis markers  Lab 05/06/12 1025  LATICACIDVEN 1.7  PROCALCITON --   Cardiac markers  Lab 05/06/12 1012  CKTOTAL --  CKMB --  TROPONINI <0.30   BNP No results found for this basename: PROBNP:3 in the last 168 hours ABG No results found for this basename: PHART:3,PCO2ART:3,PO2ART:3,HCO3:3,TCO2:3 in the last 168 hours  CBG trend  Lab 05/10/12 0747 05/09/12 2200 05/09/12 1653 05/09/12 1143  05/09/12 0833  GLUCAP 120* 134* 165* 141* 153*    IMAGING pcxr 12/7 The left retrocardiac density is concerning for pneumonia. Small  left effusion.  Left upper lobe and right basilar atelectasis.  Bilateral hilar fullness, right greater than left. Cannot exclude  adenopathy.      DIAGNOSES: Active Problems:  Atrial fibrillation with RVR  Acute respiratory failure  Dysphagia  HTN (hypertension)  H/O: CVA (cerebrovascular accident)  GERD (gastroesophageal reflux disease)  Purulent bronchitis   ASSESSMENT / PLAN:  1) Acute respiratory failure in setting of purulent bronchitis vs CAP superimposed on what looks like chronic interstitial lung disease.  Stopped smoking in 1980s, could be element of COPD but unlikely. He has h/o CVA so chronic aspiration could certainly be a consideration. Also possibly related to ace-I.    PLAN:   Scheduled nebs Systemic steroids-->wean to pred and taper to off.  Empirical levaquin x 10 days to complete on 1214 D/c  acei for now.     2) afib w/ RVR  - Echo 03/09/12 Left ventricle: The cavity size was normal. Wall thickness was normal. Systolic function was normal. The estimated ejection fraction was in the range of 50% to 55%. - Mitral valve: Mild regurgitation. - Left atrium: The atrium was mildly dilated. - Right ventricle: The cavity size was mildly dilated. Systolic function was mildly reduced. - Right atrium: The atrium was mildly dilated. - Pulmonary arteries: PA peak pressure: 34mm Hg     PLAN:  Cont coumadin  Tol  xopenex Changed to just bisoprolol am 12/8 as at times overcontrolled  3) Mild hyperkalemia  PLAN:   Monitor       4) GERD & Dysphagia  - SLP eval  12/6 > reg/ thin liquid     5) Hyperglycemia  Aggravated by steroids   PLAN:   ssi Wean steroids   6) H/o cva  & Bells palsy  No acute issues    Start discharge planning    Sandrea Hughs, MD Pulmonary and Critical Care Medicine [facility] Cell [phone]     05/10/2012, 10:31 AM

## 2012-05-10 NOTE — Progress Notes (Signed)
ANTICOAGULATION CONSULT NOTE - Follow Up Consult  Pharmacy Consult for warfarin Indication: atrial fibrillation  Allergies  Allergen Reactions  . Penicillins     hives    Patient Measurements: Height: 5\' 11"  (180.3 cm) Weight: 169 lb 1.5 oz (76.7 kg) (STANDING SCALE) IBW/kg (Calculated) : 75.3   Vital Signs: Temp: 97.9 F (36.6 C) (12/08 0702) Temp src: Oral (12/08 0702) BP: 124/86 mmHg (12/08 0702) Pulse Rate: 94  (12/08 0702)  Labs:  Basename 05/10/12 0535 05/09/12 0610 05/08/12 0340  HGB -- 12.6* 12.6*  HCT -- 38.7* 38.1*  PLT -- 172 165  APTT -- -- --  LABPROT 29.8* 26.4* 20.6*  INR 3.03* 2.58* 1.84*  HEPARINUNFRC -- -- --  CREATININE -- -- 0.97  CKTOTAL -- -- --  CKMB -- -- --  TROPONINI -- -- --    Estimated Creatinine Clearance: 67.9 ml/min (by C-G formula based on Cr of 0.97).   Medications:  Scheduled:     . bisoprolol  5 mg Oral Daily  . diltiazem  30 mg Oral Q6H  . doxazosin  4 mg Oral Daily  . dutasteride  0.5 mg Oral Daily  . gabapentin  600 mg Oral TID  . insulin aspart  0-20 Units Subcutaneous TID WC  . insulin aspart  0-5 Units Subcutaneous QHS  . ipratropium  0.5 mg Nebulization Q6H  . levalbuterol  0.63 mg Nebulization Q6H  . levofloxacin  500 mg Oral Daily  . pantoprazole  40 mg Oral Daily  . predniSONE  40 mg Oral Q breakfast  . [COMPLETED] warfarin  2.5 mg Oral ONCE-1800  . Warfarin - Pharmacist Dosing Inpatient   Does not apply q1800    Assessment: 45 YOM admitted with cough, SOB, and wheezing. Patient on warfarin therapy for h/o afib and CVA. INR subtherapeutic at admission.  Home dose 5mg  on Mon, 2.5mg  other days  CBC stable, no bleeding reported  INR now slightly supratherapeutic (3.02) and rising following, likely from boosted doses 12/4 - 12/6 and  Potential drug interactions with warfarin (potentiate effect): levofloxacin  Goal of Therapy:  INR 2-3 Monitor platelets by anticoagulation protocol: Yes   Plan:    Hold coumadin today  Daily INR  Loralee Pacas, PharmD, BCPS Pager: [phone] 05/10/2012 9:36 AM

## 2012-05-10 NOTE — Evaluation (Signed)
Physical Therapy Evaluation Patient Details Name: [name] MRN: [id number] DOB: [birth year] Today's Date: 05/10/2012 Time: 4098-1191 PT Time Calculation (min): 17 min  PT Assessment / Plan / Recommendation Clinical Impression  Pt presents with afib and RVR and respiratory failure vs CAP.  Tolerated ambulation in hallway with RW very well and kept O2 sats above 90% on 4L.  Attempted ambulation on 3L, however O2 sats dropped to 89%.  Pt will benefit from skilled PT in acute venue to address defictis.  PT recommends HHPT for follow up at D/C.     PT Assessment  Patient needs continued PT services    Follow Up Recommendations  Home health PT;Supervision - Intermittent    Does the patient have the potential to tolerate intense rehabilitation      Barriers to Discharge None      Equipment Recommendations  Rolling walker with 5" wheels    Recommendations for Other Services     Frequency Min 3X/week    Precautions / Restrictions Precautions Precautions: Fall Precaution Comments: monitor O2 sats.  Restrictions Weight Bearing Restrictions: No   Pertinent Vitals/Pain No pain      Mobility  Bed Mobility Bed Mobility: Not assessed Details for Bed Mobility Assistance: Pt in recliner when PT arrived.  Transfers Transfers: Stand to Sit;Sit to Stand Sit to Stand: 4: Min guard;With upper extremity assist;With armrests;From chair/3-in-1 Stand to Sit: 4: Min guard;With upper extremity assist;With armrests;To chair/3-in-1 Details for Transfer Assistance: Min/guard for safety with min cues for hand placement and safety.  Ambulation/Gait Ambulation/Gait Assistance: 4: Min guard Ambulation Distance (Feet): 300 Feet Assistive device: Rolling walker Ambulation/Gait Assistance Details: Min cues for upright posture and maintaining upright posture throughout.  Ambulated initially on 6L O2 with O2 sats at 96-97%, decreased O2 to 4L with O2 sats at 93% and decreased to 3L O2 with O2 sats at  89%.  RN notified.   Gait Pattern: Step-through pattern;Decreased stride length;Trunk flexed Gait velocity: decreased Stairs: No Wheelchair Mobility Wheelchair Mobility: No    Shoulder Instructions     Exercises     PT Diagnosis: Difficulty walking;Generalized weakness  PT Problem List: Decreased strength;Decreased activity tolerance;Decreased balance;Decreased mobility;Decreased knowledge of use of DME;Decreased safety awareness;Cardiopulmonary status limiting activity PT Treatment Interventions: DME instruction;Gait training;Stair training;Functional mobility training;Therapeutic activities;Therapeutic exercise;Balance training;Patient/family education   PT Goals Acute Rehab PT Goals PT Goal Formulation: With patient Time For Goal Achievement: 05/24/12 Potential to Achieve Goals: Good Pt will go Supine/Side to Sit: with supervision PT Goal: Supine/Side to Sit - Progress: Goal set today Pt will go Sit to Supine/Side: with supervision PT Goal: Sit to Supine/Side - Progress: Goal set today Pt will go Sit to Stand: with modified independence PT Goal: Sit to Stand - Progress: Goal set today Pt will Ambulate: >150 feet;with modified independence;with least restrictive assistive device (with O2 sats >90%) PT Goal: Ambulate - Progress: Goal set today Pt will Go Up / Down Stairs: 1-2 stairs;with min assist;with least restrictive assistive device PT Goal: Up/Down Stairs - Progress: Goal set today  Visit Information  Last PT Received On: 05/10/12 Assistance Needed: +1    Subjective Data  Subjective: I feel pretty good.  Patient Stated Goal: to get back home.    Prior Functioning  Home Living Lives With: Family Available Help at Discharge: Neighbor;Family;Available PRN/intermittently (available most of time) Type of Home: House Home Access: Stairs to enter [employer] of Steps: 1 then 1 Entrance Stairs-Rails: None Home Layout: One level Firefighter: Standard  Home  Adaptive Equipment: None Prior Function Level of Independence: Independent Able to Take Stairs?: Yes Driving: Yes Vocation: Retired Musician: No difficulties    Cognition  Overall Cognitive Status: Appears within functional limits for tasks assessed/performed Arousal/Alertness: Awake/alert Orientation Level: Appears intact for tasks assessed Behavior During Session: [facility] for tasks performed    Extremity/Trunk Assessment Right Lower Extremity Assessment RLE ROM/Strength/Tone: WFL for tasks assessed RLE Sensation: WFL - Light Touch Left Lower Extremity Assessment LLE ROM/Strength/Tone: WFL for tasks assessed LLE Sensation: WFL - Light Touch Trunk Assessment Trunk Assessment: Kyphotic   Balance    End of Session PT - End of Session Equipment Utilized During Treatment: Oxygen Activity Tolerance: Patient tolerated treatment well Patient left: in chair;with call bell/phone within reach;with family/visitor present Nurse Communication: Mobility status  GP     Page, Meribeth Mattes 05/10/2012, 2:47 PM

## 2012-05-11 LAB — GLUCOSE, CAPILLARY
Glucose-Capillary: 103 mg/dL — ABNORMAL HIGH (ref 70–99)
Glucose-Capillary: 124 mg/dL — ABNORMAL HIGH (ref 70–99)

## 2012-05-11 NOTE — Progress Notes (Signed)
PULMONARY  / CRITICAL CARE MEDICINE  Name: [name] MRN: [id number] DOB: 05/26/1933    LOS: 5  REFERRING MD :  Rito Ehrlich   CHIEF COMPLAINT:  resp failure   BRIEF PATIENT DESCRIPTION:  53 yowm remote smoker maintained on ACEI chronically admitted on 12/4 w/ acute respiratory failure in setting of presumed bronchitis   SIGNIFICANT EVENTS:      CULTURES: MRSA screen 12/4 > neg UC 12/4>>>neg  bcx 2 12/4 >>>   ANTIBIOTICS: levaquin 12/4>>>  LEVEL OF CARE:  SDU PRIMARY SERVICE:  PCCM CONSULTANTS:  none CODE STATUS: limited, intubation, pressors and antiarrhythmics  DIET:  NPO  DVT Px:  Coumadin  GI Px:  PPI   INTERVAL HISTORY:    comfortable on [place] -   VITAL SIGNS: Temp:  [97.4 F (36.3 C)-98.3 F (36.8 C)] 98.3 F (36.8 C) (12/09 0520) Pulse Rate:  [81-94] 94  (12/09 0520) Resp:  [20] 20  (12/09 0520) BP: (111-140)/(72-86) 140/72 mmHg (12/09 0520) SpO2:  [96 %-97 %] 96 % (12/09 0912) Weight:  [76.2 kg (167 lb 15.9 oz)] 76.2 kg (167 lb 15.9 oz) (12/09 0520) FIO2 4 liters     INTAKE / OUTPUT: Intake/Output      12/08 0701 - 12/09 0700 12/09 0701 - 12/10 0700   P.O. 140    IV Piggyback 10    Total Intake(mL/kg) 150 (2)    Urine (mL/kg/hr) 975 (0.5) 350 (0.9)   Stool 0    Total Output 975 350   Net -825 -350        Urine Occurrence 1 x 1 x   Stool Occurrence 1 x      PHYSICAL EXAMINATION: General:  Elderly male currently resting comfortably  Neuro:  Awake, oriented, no focal def . Poor menory HEENT:  [place], no JVD, left facial droop (h/o bells palsy) Cardiovascular:  rrr Lungs:  Distant expiratory wheeze, scattered rhonchi and basilar rales.  Abdomen:  Soft non-tender  Musculoskeletal:  Intact  Skin:  Intact, multiple areas of ecchymosis    LABS: Cbc  Lab 05/09/12 0610 05/08/12 0340 05/07/12 0330  WBC 11.1* -- --  HGB 12.6* 12.6* 13.4  HCT 38.7* 38.1* 39.7  PLT 172 165 139*    Chemistry   Lab 05/08/12 0340 05/07/12 0330 05/06/12 1025   NA 137 134* 133*  K 5.1 3.1* 3.4*  CL 102 96 93*  CO2 27 25 23   BUN 40* 25* 20  CREATININE 0.97 0.89 0.85  CALCIUM 9.1 8.9 9.2  MG -- -- --  PHOS -- -- --  GLUCOSE 183* 185* 118*    Liver fxn  Lab 05/08/12 0340 05/06/12 1025  AST 32 27  ALT 21 16  ALKPHOS 51 54  BILITOT 0.4 1.5*  PROT 6.2 7.2  ALBUMIN 2.7* 3.2*   coags  Lab 05/11/12 0515 05/10/12 0535 05/09/12 0610 05/07/12 0330 05/06/12 1025  APTT -- -- -- 42* 56*  INR 2.84* 3.03* 2.58* -- --   Sepsis markers  Lab 05/06/12 1025  LATICACIDVEN 1.7  PROCALCITON --   Cardiac markers  Lab 05/06/12 1012  CKTOTAL --  CKMB --  TROPONINI <0.30   BNP No results found for this basename: PROBNP:3 in the last 168 hours ABG No results found for this basename: PHART:3,PCO2ART:3,PO2ART:3,HCO3:3,TCO2:3 in the last 168 hours  CBG trend  Lab 05/11/12 0753 05/10/12 2202 05/10/12 1732 05/10/12 1204 05/10/12 0747  GLUCAP 103* 152* 171* 130* 120*    IMAGING pcxr 12/7 The left retrocardiac  density is concerning for pneumonia. Small  left effusion.  Left upper lobe and right basilar atelectasis.  Bilateral hilar fullness, right greater than left. Cannot exclude  adenopathy.      DIAGNOSES: Active Problems:  Atrial fibrillation with RVR  Acute respiratory failure  Dysphagia  HTN (hypertension)  H/O: CVA (cerebrovascular accident)  GERD (gastroesophageal reflux disease)  Purulent bronchitis   ASSESSMENT / PLAN:  1) Acute respiratory failure in setting of purulent bronchitis vs CAP superimposed on what looks like chronic interstitial lung disease.  Stopped smoking in 1980s, could be element of COPD but unlikely. He has h/o CVA so chronic aspiration could certainly be a consideration. Also possibly related to ace-I.    PLAN:   Scheduled nebs Systemic steroids-->wean to pred and taper to off.  Empirical levaquin x 10 days to complete on 12-14 D/c acei for now.     2) afib w/ RVR  - Echo 03/09/12 Left  ventricle: The cavity size was normal. Wall thickness was normal. Systolic function was normal. The estimated ejection fraction was in the range of 50% to 55%. - Mitral valve: Mild regurgitation. - Left atrium: The atrium was mildly dilated. - Right ventricle: The cavity size was mildly dilated. Systolic function was mildly reduced. - Right atrium: The atrium was mildly dilated. - Pulmonary arteries: PA peak pressure: 34mm Hg     PLAN:  Lab Results  Component Value Date   INR 2.84* 05/11/2012   INR 3.03* 05/10/2012   INR 2.58* 05/09/2012    Cont coumadin  Tol  xopenex Changed to just bisoprolol am 12/8 as at times overcontrolled  3) Mild hyperkalemia   Lab 05/08/12 0340 05/07/12 0330 05/06/12 1025  K 5.1 3.1* 3.4*     PLAN:   Monitor       4) GERD & Dysphagia  - SLP eval  12/6 > reg/ thin liquid     5) Hyperglycemia  Aggravated by steroids   PLAN:   ssi Wean steroids   6) H/o cva  & Bells palsy  No acute issues    Start discharge planning   ? Needs SNF.Social worker consult 12/9.  Brett Canales Minor ACNP [facility] PCCM Pager [phone] till 3 pm If no answer page [phone] 05/11/2012, 12:11 PM  Patient seen and examined, agree with above note.  I dictated the care and orders written for this patient under my direction.  Koren Bound, M.D. [phone]

## 2012-05-11 NOTE — Progress Notes (Signed)
ANTICOAGULATION CONSULT NOTE - Follow Up Consult  Pharmacy Consult for warfarin Indication: atrial fibrillation  Allergies  Allergen Reactions  . Penicillins     hives   Patient Measurements: Height: 5\' 11"  (180.3 cm) Weight: 167 lb 15.9 oz (76.2 kg) IBW/kg (Calculated) : 75.3   Vital Signs: Temp: 98.3 F (36.8 C) (12/09 0520) Temp src: Oral (12/09 0520) BP: 140/72 mmHg (12/09 0520) Pulse Rate: 94  (12/09 0520)  Labs:  Basename 05/11/12 0515 05/10/12 0535 05/09/12 0610  HGB -- -- 12.6*  HCT -- -- 38.7*  PLT -- -- 172  APTT -- -- --  LABPROT 28.4* 29.8* 26.4*  INR 2.84* 3.03* 2.58*  HEPARINUNFRC -- -- --  CREATININE -- -- --  CKTOTAL -- -- --  CKMB -- -- --  TROPONINI -- -- --   Estimated Creatinine Clearance: 67.9 ml/min (by C-G formula based on Cr of 0.97).  Medications:  Scheduled:     . bisoprolol  5 mg Oral Daily  . doxazosin  4 mg Oral Daily  . dutasteride  0.5 mg Oral Daily  . gabapentin  600 mg Oral TID  . insulin aspart  0-20 Units Subcutaneous TID WC  . insulin aspart  0-5 Units Subcutaneous QHS  . ipratropium  0.5 mg Nebulization QID  . levalbuterol  0.63 mg Nebulization QID  . levofloxacin  500 mg Oral Daily  . pantoprazole  40 mg Oral Daily  . predniSONE  40 mg Oral Q breakfast  . Warfarin - Pharmacist Dosing Inpatient   Does not apply q1800  . [DISCONTINUED] diltiazem  30 mg Oral Q6H  . [DISCONTINUED] ipratropium  0.5 mg Nebulization Q6H  . [DISCONTINUED] levalbuterol  0.63 mg Nebulization Q6H    Assessment: 79 YOM admitted with cough, SOB, and wheezing. Patient on warfarin therapy for h/o afib and CVA. INR subtherapeutic at admission.  Home dose 5mg  on Mon, 2.5mg  other days  CBC stable, no bleeding reported  INR now back into therapeutic range (2.84), but decreased po intake and continues on Levofloxacin.   Potential drug interactions with warfarin (potentiate effect): levofloxacin  Goal of Therapy:  INR 2-3 Monitor platelets by  anticoagulation protocol: Yes   Plan:   Hold coumadin today  Daily INR  Otho Bellows PharmD Pager: [phone] 05/11/2012 10:37 AM

## 2012-05-12 DIAGNOSIS — K922 Gastrointestinal hemorrhage, unspecified: Secondary | ICD-10-CM

## 2012-05-12 DIAGNOSIS — D62 Acute posthemorrhagic anemia: Secondary | ICD-10-CM

## 2012-05-12 LAB — GLUCOSE, CAPILLARY
Glucose-Capillary: 204 mg/dL — ABNORMAL HIGH (ref 70–99)
Glucose-Capillary: 118 mg/dL — ABNORMAL HIGH (ref 70–99)

## 2012-05-12 LAB — BASIC METABOLIC PANEL
BUN: 48 mg/dL — ABNORMAL HIGH (ref 6–23)
CO2: 34 mEq/L — ABNORMAL HIGH (ref 19–32)
Chloride: 100 mEq/L (ref 96–112)
GFR calc Af Amer: >90 mL/min (ref >90)
Glucose, Bld: 134 mg/dL — ABNORMAL HIGH (ref 70–99)
Potassium: 4.5 mEq/L (ref 3.5–5.1)

## 2012-05-12 LAB — PROTIME-INR: Prothrombin Time: 27.1 seconds — ABNORMAL HIGH (ref 11.6–15.2)

## 2012-05-12 LAB — CBC
HCT: 35.5 % — ABNORMAL LOW (ref 39.0–52.0)
Hemoglobin: 11.7 g/dL — ABNORMAL LOW (ref 13.0–17.0)
MCHC: 33 g/dL (ref 30.0–36.0)

## 2012-05-12 MED ORDER — WARFARIN SODIUM 1 MG PO TABS
1.0000 mg | ORAL_TABLET | Freq: Once | ORAL | Status: DC
  Filled 2012-05-12: qty 1

## 2012-05-12 NOTE — Care Management Note (Signed)
    Page 1 of 2   05/15/2012     12:58:35 PM   CARE MANAGEMENT NOTE 05/15/2012  Patient:  [name], [name]   Account Number:  [id number]  Date Initiated:  05/07/2012  Documentation initiated by:  DAVIS,RHONDA  Subjective/Objective Assessment:   ADMITTED W/A FIB.     Action/Plan:   FROM HOME   Anticipated DC Date:  05/15/2012   Anticipated DC Plan:  HOME W HOME HEALTH SERVICES      DC Planning Services  CM consult      Choice offered to / List presented to:  C-1 Patient   DME arranged  NEBULIZER MACHINE      DME agency  [facility]     HH arranged  HH-1 RN  HH-2 PT  HH-3 OT  HH-4 NURSE'S AIDE      HH agency  [facility]   Status of service:  Completed, signed off Medicare Important Message given?   (If response is "NO", the following Medicare IM given date fields will be blank) Date Medicare IM given:   Date Additional Medicare IM given:    Discharge Disposition:  HOME W HOME HEALTH SERVICES  Per UR Regulation:  Reviewed for med. necessity/level of care/duration of stay  If discussed at Long Length of Stay Meetings, dates discussed:   05/12/2012    Comments:  05/15/12 Jalaiyah Throgmorton RN,BSN NCM [phone] [facility]) AWARE OF D/C HOME W/HH/NEB MACHINE-TO BE DELIVERED TO HOME.DOES NOT QUALIFY FOR HOME 02.[facility] AWARE OF PT/INR FOR TOMORROW. 05/12/12 Tarica Harl RN,BSN NCM [phone] HAVING BLOODY STOOL.PT-HH.[facility] CHOSEN FOR HH,SUSAN([place]) FOLLOWING.RECOMMEND HHRN/PT,RW.  [id number] Earlene Plater, RN, BSN, CCM: CHART REVIEWED AND UPDATED.  Next chart review due on [id number]. NO DISCHARGE NEEDS PRESENT AT THIS TIME. CASE MANAGEMENT [phone]

## 2012-05-12 NOTE — Progress Notes (Addendum)
ANTICOAGULATION CONSULT NOTE - Follow Up Consult  Pharmacy Consult for warfarin Indication: atrial fibrillation  Allergies  Allergen Reactions  . Penicillins     hives   Patient Measurements: Height: 5\' 11"  (180.3 cm) Weight: 167 lb 15.9 oz (76.2 kg) IBW/kg (Calculated) : 75.3   Vital Signs: Temp: 97.4 F (36.3 C) (12/10 0635) Temp src: Oral (12/10 0635) BP: 100/67 mmHg (12/10 0635) Pulse Rate: 76  (12/10 0823)  Labs:  Basename 05/12/12 0515 05/11/12 0515 05/10/12 0535  HGB 11.7* -- --  HCT 35.5* -- --  PLT 210 -- --  APTT -- -- --  LABPROT 27.1* 28.4* 29.8*  INR 2.67* 2.84* 3.03*  HEPARINUNFRC -- -- --  CREATININE 0.86 -- --  CKTOTAL -- -- --  CKMB -- -- --  TROPONINI -- -- --   Estimated Creatinine Clearance: 76.6 ml/min (by C-G formula based on Cr of 0.86).  Medications:  Scheduled:     . bisoprolol  5 mg Oral Daily  . doxazosin  4 mg Oral Daily  . dutasteride  0.5 mg Oral Daily  . gabapentin  600 mg Oral TID  . insulin aspart  0-20 Units Subcutaneous TID WC  . insulin aspart  0-5 Units Subcutaneous QHS  . ipratropium  0.5 mg Nebulization QID  . levalbuterol  0.63 mg Nebulization QID  . levofloxacin  500 mg Oral Daily  . pantoprazole  40 mg Oral Daily  . predniSONE  40 mg Oral Q breakfast  . Warfarin - Pharmacist Dosing Inpatient   Does not apply q1800    Assessment: 72 YOM admitted with cough, SOB, and wheezing. Patient on warfarin therapy for h/o afib and CVA. INR subtherapeutic at admission.  Home dose 5mg  on Mon, 2.5mg  other days  CBC stable, no bleeding reported  INR now back into therapeutic range (2.67), but decreased po intake and continues on Levofloxacin.   Potential drug interactions with warfarin (potentiate effect): levofloxacin  No Warfarin 12/8 or 12/9, po intake decreased  Goal of Therapy:  INR 2-3 Monitor platelets by anticoagulation protocol: Yes   Plan:   Planned to give only 1mg  Warfarin today, but notified of black  tarry stools.  Continue to hold Warfarin  Daily INR  Otho Bellows PharmD Pager: [phone] 05/12/2012 11:48 AM

## 2012-05-12 NOTE — Consult Note (Signed)
Referring Provider: No ref. provider found Primary Care Physician:  Kristian Covey, MD Primary Gastroenterologist:  Dr. Juanda Chance  Reason for Consultation:  GIB on coumadin  HPI: [name] is a 78 y.o. male who is a remote smoker maintained on ACEI chronically admitted on 12/4 w/ acute respiratory failure in setting of presumed bronchitis.  While being treated for that issue he developed some black-maroon colored stools.  Had two episodes of that today.  He has atrial fibrillation and requires chronic coumadin therapy.  INR within therapeutic range.  Had bouts with anemia and GIBing in the past.  GI evaluation as follows:  Colonoscopy 2005 with internal hemorrhoids only, EGD in 2003 and also 2008, I believe, with large hiatal hernia, gastric polyps, and gastritis.  Bleeding was deemed to be due to the hiatal hernia and gastritis.  No further bleeding issues for some time.  Last couple of years Hgb has been in the 14 gram range; this is where it was on admission as well.  Down to 11.7 grams today.  No abdominal pain.   Still with difficulty breathing.  Feels weak.  Has history of gastric ulcers and small bowel resection in the past as well.  ? If small bowel resection was due to ischemia.   Past Medical History  Diagnosis Date  . History of upper gastrointestinal hemorrhage   . Cerebrovascular accident   . Gastric polyp   . Dysphagia, unspecified   . Coronary artery disease     nonobstructive by cath 2009  . Permanent atrial fibrillation   . Hypertension   . Esophagitis   . GERD (gastroesophageal reflux disease)   . Erosive gastritis   . FH: colonic polyps   . Gastric ulcer   . Hiatal hernia   . Internal hemorrhoids     Past Surgical History  Procedure Date  . Cholecystectomy   . Asd repair [id number]  . Inguinal hernia repair 1963  . Partial small bowel resection 2005    ischemic?    Prior to Admission medications   Medication Sig Start Date End Date Taking? Authorizing  Provider  amLODipine (NORVASC) 5 MG tablet Take 1 tablet (5 mg total) by mouth daily. 02/04/12  Yes Kristian Covey, MD  AVODART 0.5 MG capsule Take 0.5 mg by mouth daily.  01/03/11  Yes Historical Provider, MD  cyanocobalamin (,VITAMIN B-12,) 1000 MCG/ML injection Inject 1,000 mcg into the muscle once.     Yes Historical Provider, MD  doxazosin (CARDURA) 4 MG tablet Take 4 mg by mouth daily.  01/09/12  Yes Historical Provider, MD  enalapril (VASOTEC) 10 MG tablet Take 1 tablet (10 mg total) by mouth 2 (two) times daily. 02/05/12  Yes Kristian Covey, MD  gabapentin (NEURONTIN) 600 MG tablet Take 1 tablet (600 mg total) by mouth 3 (three) times daily. 08/15/11 08/14/12 Yes Kristian Covey, MD  hydrochlorothiazide (HYDRODIURIL) 25 MG tablet Take 1/2 tab daily 02/04/12  Yes Kristian Covey, MD  LOTRISONE cream APPLY TO AFFECTED AREAS TWICE A DAY 02/18/12  Yes Kristian Covey, MD  PRILOSEC 20 MG capsule TAKE (2) CAPSULES DAILY. 08/24/11  Yes Kristian Covey, MD  warfarin (COUMADIN) 5 MG tablet Take 2.5-5 mg by mouth See admin instructions. Pt takes 2.5 mg every day except on Monday pt takes 5 mg   Yes Historical Provider, MD  ZOCOR 20 MG tablet TAKE ONE TABLET AT BEDTIME 01/15/12  Yes Kristian Covey, MD    Current Facility-Administered Medications  Medication Dose Route Frequency Provider Last Rate Last Dose  . bisoprolol (ZEBETA) tablet 5 mg  5 mg Oral Daily Simonne Martinet, NP   5 mg at 05/12/12 0939  . doxazosin (CARDURA) tablet 4 mg  4 mg Oral Daily Simonne Martinet, NP   4 mg at 05/12/12 0940  . dutasteride (AVODART) capsule 0.5 mg  0.5 mg Oral Daily Simonne Martinet, NP   0.5 mg at 05/12/12 0940  . gabapentin (NEURONTIN) capsule 600 mg  600 mg Oral TID Simonne Martinet, NP   600 mg at 05/12/12 0940  . insulin aspart (novoLOG) injection 0-20 Units  0-20 Units Subcutaneous TID WC Merwyn Katos, MD   3 Units at 05/12/12 1200  . insulin aspart (novoLOG) injection 0-5 Units  0-5 Units Subcutaneous  QHS Merwyn Katos, MD      . ipratropium (ATROVENT) nebulizer solution 0.5 mg  0.5 mg Nebulization QID Nyoka Cowden, MD   0.5 mg at 05/12/12 1214  . levalbuterol (XOPENEX) nebulizer solution 0.63 mg  0.63 mg Nebulization QID Nyoka Cowden, MD   0.63 mg at 05/12/12 1215  . levofloxacin (LEVAQUIN) tablet 500 mg  500 mg Oral Daily Merwyn Katos, MD   500 mg at 05/12/12 0940  . pantoprazole (PROTONIX) EC tablet 40 mg  40 mg Oral Daily Tad Moore [place], PHARMD   40 mg at 05/12/12 0940  . predniSONE (DELTASONE) tablet 40 mg  40 mg Oral Q breakfast Simonne Martinet, NP   40 mg at 05/12/12 0806  . [DISCONTINUED] warfarin (COUMADIN) tablet 1 mg  1 mg Oral ONCE-1800 Otho Bellows, PHARMD      . [DISCONTINUED] Warfarin - Pharmacist Dosing Inpatient   Does not apply q1800 Rollene Fare, PHARMD        Allergies as of 05/06/2012 - Review Complete 05/06/2012  Allergen Reaction Noted  . Penicillins      History reviewed. No pertinent family history.  History   Social History  . Marital Status: Married    Spouse Name: N/A    Number of Children: N/A  . Years of Education: N/A   Occupational History  . Not on file.   Social History Main Topics  . Smoking status: Former Smoker -- 1.0 packs/day for 20 years    Types: Cigarettes    Quit date: 06/13/1983  . Smokeless tobacco: Never Used  . Alcohol Use: No  . Drug Use: No  . Sexually Active: No   Other Topics Concern  . Not on file   Social History Narrative   Lives in [place]    Review of Systems: Ten point ROS is O/W negative except as mentioned in HPI.  Physical Exam: Vital signs in last 24 hours: Temp:  [97.2 F (36.2 C)-97.9 F (36.6 C)] 97.2 F (36.2 C) (12/10 1250) Pulse Rate:  [66-91] 75  (12/10 1250) Resp:  [18-20] 18  (12/10 1250) BP: (100-116)/(57-67) 116/61 mmHg (12/10 1250) SpO2:  [95 %-98 %] 98 % (12/10 1217) Last BM Date: 05/12/12 General:   Alert, chronically ill-appearing, pleasant and cooperative in  mild respiratory distress; SOB Head:  Normocephalic and atraumatic. Eyes:  Sclera clear, no icterus.  Conjunctiva pink. Ears:  Normal auditory acuity. Mouth:  No deformity or lesions.   Lungs:  Wheezing and crackles heard B/L.  Remote sternotomy scar noted on chest. Heart:  Regular rate and rhythm; no murmurs, clicks, rubs,  or gallops. Abdomen:  Soft, nontender, BS active, nonpalp mass  or hsm.   Rectal:  Deferred.    Msk:  Symmetrical without gross deformities. Pulses:  Normal pulses noted. Extremities:  Without clubbing or edema. Neurologic:  Alert and  oriented x4;  grossly normal neurologically. Skin:  Intact without significant lesions or rashes; multiple areas of ecchymosis Psych:  Alert and cooperative. Normal mood and affect.  Intake/Output from previous day: 12/09 0701 - 12/10 0700 In: 120 [P.O.:120] Out: 525 [Urine:525] Intake/Output this shift: Total I/O In: 40 [P.O.:40] Out: -   Lab Results:  [facility] 05/12/12 0515  WBC 9.8  HGB 11.7*  HCT 35.5*  PLT 210   BMET  Basename 05/12/12 0515  NA 139  K 4.5  CL 100  CO2 34*  GLUCOSE 134*  BUN 48*  CREATININE 0.86  CALCIUM 9.0   PT/INR  Basename 05/12/12 0515 05/11/12 0515  LABPROT 27.1* 28.4*  INR 2.67* 2.84*   IMPRESSION:  -GIB:  Likely upper or small bowel source.   -Blood loss anemia:  Slight drop in Hgb.  Likely somewhat dilutional -Large hiatal hernia and gastritis seen on previous EGD's, last in 2008. -Acute bronchitis with respiratory failure:  Improved. -Afib with need for coumadin anticoagulation -Coumadin coagulopathy  PLAN: -Likely EGD/enteroscopy 12/11 to evaluate source of bleeding due to chronic coumadin -Monitor Hgb and transfuse prn. -Coumadin on hold for GIB.   Askia Hazelip D.  05/12/2012, 1:33 PM  Pager number [phone]

## 2012-05-12 NOTE — Progress Notes (Signed)
Physical Therapy Treatment Patient Details Name: [name] MRN: [id number] DOB: [birth year] Today's Date: 05/12/2012 Time: 4098-1191 PT Time Calculation (min): 23 min  PT Assessment / Plan / Recommendation Comments on Treatment Session  Pt is notably more fatigued today during session and slower with mobility.  Spoke with RN about pt and she states that they found some blood in stool earlier, therefore fatigue possibly due to some blood loss.     Follow Up Recommendations  Home health PT;Supervision - Intermittent     Does the patient have the potential to tolerate intense rehabilitation     Barriers to Discharge        Equipment Recommendations  Rolling walker with 5" wheels    Recommendations for Other Services    Frequency Min 3X/week   Plan Discharge plan remains appropriate    Precautions / Restrictions Precautions Precautions: Fall Precaution Comments: monitor O2 sats.  Restrictions Weight Bearing Restrictions: No   Pertinent Vitals/Pain No pain, just fatigued    Mobility  Bed Mobility Bed Mobility: Supine to Sit Supine to Sit: 4: Min guard;HOB elevated Details for Bed Mobility Assistance: Min/gaurd for safety of trunk when getting to EOB.  cues for technique and hand placement.  Transfers Transfers: Stand to Sit;Sit to Stand Sit to Stand: 4: Min guard;With upper extremity assist;From bed;From elevated surface Stand to Sit: 4: Min guard;With upper extremity assist;With armrests;To chair/3-in-1 Details for Transfer Assistance: Min/guard for safety with cues for hand placement and safety when sitting/standing.  Ambulation/Gait Ambulation/Gait Assistance: 4: Min assist Ambulation Distance (Feet): 90 Feet Assistive device: Rolling walker Ambulation/Gait Assistance Details: Requires somewhat increased assist for safety today due to pt states he feel "wishy washy" when up.  Questioned whether pt was dizzy and he said a little.  Noted increased fatigue and slower  mobility today during session.  Min cues for maintaining position inside of RW.  Gait Pattern: Step-through pattern;Decreased stride length;Trunk flexed Gait velocity: decreased General Gait Details: Ambulated on 4L O2 with O2 sats in upper 90's throughout.  HR up to 120 with ambulation.     Exercises General Exercises - Lower Extremity Ankle Circles/Pumps: AROM;Both;20 reps Quad Sets: Strengthening;Both;10 reps Hip ABduction/ADduction: Strengthening;Both;10 reps (pillow squeeze)   PT Diagnosis:    PT Problem List:   PT Treatment Interventions:     PT Goals Acute Rehab PT Goals PT Goal Formulation: With patient Time For Goal Achievement: 05/24/12 Potential to Achieve Goals: Good Pt will go Supine/Side to Sit: with supervision PT Goal: Supine/Side to Sit - Progress: Progressing toward goal Pt will go Sit to Stand: with modified independence PT Goal: Sit to Stand - Progress: Progressing toward goal Pt will Ambulate: >150 feet;with modified independence;with least restrictive assistive device PT Goal: Ambulate - Progress: Progressing toward goal  Visit Information  Last PT Received On: 05/12/12 Assistance Needed: +1    Subjective Data  Subjective: I don't hurt, but I just feel tired and weak.  Patient Stated Goal: to get back home.    Cognition  Overall Cognitive Status: Appears within functional limits for tasks assessed/performed Arousal/Alertness: Awake/alert Orientation Level: Appears intact for tasks assessed Behavior During Session: [facility] for tasks performed    Balance     End of Session PT - End of Session Equipment Utilized During Treatment: Oxygen;Gait belt Activity Tolerance: Patient limited by fatigue Patient left: in chair;with call bell/phone within reach;with family/visitor present;with chair alarm set Nurse Communication: Mobility status   GP     Page, Meribeth Mattes 05/12/2012,  10:59 AM

## 2012-05-12 NOTE — Consult Note (Signed)
Chart was reviewed and patient was examined. X-rays were reviewed.   Pt is admitted with bronchitis and acute upper GI bleeding in the setting of Coumadin-induced coagulopathy. Clinically he has been bleeding for a couple of days. I suspect he may have stress gastritis. Active ulcer disease is less likely in the face of chronic PPI therapy. AVMs of the upper GI tract including small bowel are other possibilities.  Recommend twice a day PPI therapy. Can hold Coumadin for now and allow INR to adjust downward. Should severe bleeding occurred then would give fresh frozen plasma. At present the patient's respiratory status is too fragile for safe endoscopy. Plan to do upper endoscopy once his pulmonary function has improved.  [name]. Arlyce Dice, M.D., [facility] Cell [phone]

## 2012-05-12 NOTE — Progress Notes (Signed)
Clinical Social Work Department BRIEF PSYCHOSOCIAL ASSESSMENT 05/12/2012  Patient:  LANDAN, FEDIE     Account Number:  [id number]     Admit date:  05/06/2012  Clinical Social Worker:  Hattie Perch  Date/Time:  05/12/2012 12:00 M  Referred by:  Physician  Date Referred:  05/12/2012 Referred for  SNF Placement   Other Referral:   Interview type:  Patient Other interview type:   spouse    PSYCHOSOCIAL DATA Living Status:  WIFE Admitted from facility:   Level of care:   Primary support name:  Dione Plover Primary support relationship to patient:  SPOUSE Degree of support available:   good    CURRENT CONCERNS Current Concerns  Post-Acute Placement   Other Concerns:    SOCIAL WORK ASSESSMENT / PLAN CSW met with patient. Patient is alert and oriented X3. doctor has recommended snf placement. patient states that he will go home with home health upon discharge. CSW went over physical therapy note who recommended home health.   Assessment/plan status:   Other assessment/ plan:   Information/referral to community resources:    PATIENT'S/FAMILY'S RESPONSE TO PLAN OF CARE: patient will go home upon discharge. no further CSW needs noted.

## 2012-05-12 NOTE — Progress Notes (Signed)
Patient had 2 episodes of   black watery stools, patient feels weak, H/H 11.7/35.5. Dr. Molli Knock made aware, no orders made.

## 2012-05-12 NOTE — Progress Notes (Signed)
PULMONARY  / CRITICAL CARE MEDICINE  Name: [name] MRN: [id number] DOB: 1932/10/01    LOS: 6  REFERRING MD :  Rito Ehrlich   CHIEF COMPLAINT:  resp failure   BRIEF PATIENT DESCRIPTION:  14 yowm remote smoker maintained on ACEI chronically admitted on 12/4 w/ acute respiratory failure in setting of presumed bronchitis   SIGNIFICANT EVENTS:      CULTURES: MRSA screen 12/4 > neg UC 12/4>>>neg  bcx 2 12/4 >>>   ANTIBIOTICS: levaquin 12/4>>>  LEVEL OF CARE:  SDU PRIMARY SERVICE:  PCCM CONSULTANTS:  none CODE STATUS: limited, intubation, pressors and antiarrhythmics  DIET:  NPO  DVT Px:  Coumadin  GI Px:  PPI   INTERVAL HISTORY:    comfortable on [place] -   VITAL SIGNS: Temp:  [97.2 F (36.2 C)-97.9 F (36.6 C)] 97.2 F (36.2 C) (12/10 1250) Pulse Rate:  [66-91] 75  (12/10 1250) Resp:  [18-20] 18  (12/10 1250) BP: (100-116)/(57-67) 116/61 mmHg (12/10 1250) SpO2:  [95 %-98 %] 98 % (12/10 1217) FIO2 4 liters     INTAKE / OUTPUT: Intake/Output      12/09 0701 - 12/10 0700 12/10 0701 - 12/11 0700   P.O. 120 40   IV Piggyback     Total Intake(mL/kg) 120 (1.6) 40 (0.5)   Urine (mL/kg/hr) 525 (0.3)    Total Output 525    Net -405 +40        Urine Occurrence 3 x    Stool Occurrence 2 x 1 x     PHYSICAL EXAMINATION: General:  Elderly male currently resting comfortably  Neuro:  Awake, oriented, no focal def . Poor menory HEENT:  [place], no JVD, left facial droop (h/o bells palsy) Cardiovascular:  rrr Lungs:  Distant expiratory wheeze, scattered rhonchi and basilar rales.  Abdomen:  Soft non-tender  Musculoskeletal:  Intact  Skin:  Intact, multiple areas of ecchymosis    LABS: Cbc  Lab 05/12/12 0515 05/09/12 0610 05/08/12 0340  WBC 9.8 -- --  HGB 11.7* 12.6* 12.6*  HCT 35.5* 38.7* 38.1*  PLT 210 172 165    Chemistry   Lab 05/12/12 0515 05/08/12 0340 05/07/12 0330  NA 139 137 134*  K 4.5 5.1 3.1*  CL 100 102 96  CO2 34* 27 25  BUN 48* 40* 25*   CREATININE 0.86 0.97 0.89  CALCIUM 9.0 9.1 8.9  MG -- -- --  PHOS -- -- --  GLUCOSE 134* 183* 185*    Liver fxn  Lab 05/08/12 0340 05/06/12 1025  AST 32 27  ALT 21 16  ALKPHOS 51 54  BILITOT 0.4 1.5*  PROT 6.2 7.2  ALBUMIN 2.7* 3.2*   coags  Lab 05/12/12 0515 05/11/12 0515 05/10/12 0535 05/07/12 0330 05/06/12 1025  APTT -- -- -- 42* 56*  INR 2.67* 2.84* 3.03* -- --   Sepsis markers  Lab 05/06/12 1025  LATICACIDVEN 1.7  PROCALCITON --   Cardiac markers  Lab 05/06/12 1012  CKTOTAL --  CKMB --  TROPONINI <0.30   BNP No results found for this basename: PROBNP:3 in the last 168 hours ABG No results found for this basename: PHART:3,PCO2ART:3,PO2ART:3,HCO3:3,TCO2:3 in the last 168 hours  CBG trend  Lab 05/12/12 1125 05/12/12 0733 05/11/12 2126 05/11/12 1711 05/11/12 1216  GLUCAP 133* 105* 124* 148* 122*    IMAGING pcxr 12/7 The left retrocardiac density is concerning for pneumonia. Small  left effusion.  Left upper lobe and right basilar atelectasis.  Bilateral hilar fullness,  right greater than left. Cannot exclude  adenopathy.      DIAGNOSES: Active Problems:  Atrial fibrillation with RVR  Acute respiratory failure  Dysphagia  HTN (hypertension)  H/O: CVA (cerebrovascular accident)  GERD (gastroesophageal reflux disease)  Purulent bronchitis   ASSESSMENT / PLAN:  1) Acute respiratory failure in setting of purulent bronchitis vs CAP superimposed on what looks like chronic interstitial lung disease.  Stopped smoking in 1980s, could be element of COPD but unlikely. He has h/o CVA so chronic aspiration could certainly be a consideration. Also possibly related to ace-I.    PLAN:   Scheduled nebs Systemic steroids-->wean to pred and taper to off.  Empirical levaquin x 10 days to complete on 12-14 D/c acei for now.     2) afib w/ RVR  - Echo 03/09/12 Left ventricle: The cavity size was normal. Wall thickness was normal. Systolic function was  normal. The estimated ejection fraction was in the range of 50% to 55%. - Mitral valve: Mild regurgitation. - Left atrium: The atrium was mildly dilated. - Right ventricle: The cavity size was mildly dilated. Systolic function was mildly reduced. - Right atrium: The atrium was mildly dilated. - Pulmonary arteries: PA peak pressure: 34mm Hg     PLAN:  Lab Results  Component Value Date   INR 2.67* 05/12/2012   INR 2.84* 05/11/2012   INR 3.03* 05/10/2012    Hold coumadin  Tol  xopenex Changed to just bisoprolol am 12/8 as at times overcontrolled  3) Mild hyperkalemia   Lab 05/12/12 0515 05/08/12 0340 05/07/12 0330  K 4.5 5.1 3.1*     PLAN:   Monitor       4) GERD & Dysphagia & GIB 12-10 maroon and stools are - SLP eval  12/6 > reg/ thin liquid -12-10 hold coumadin till GI sees -12-10  GI to see. Called 12-10 1310     5) Hyperglycemia  Aggravated by steroids   PLAN:   ssi Wean steroids   6) H/o cva  & Bells palsy  No acute issues    Start discharge planning   12-10 dc delayed due to maroon stools 12-10 transfer back to [facility] 12-11  Brett Canales Minor ACNP Adolph Pollack PCCM Pager [phone] till 3 pm If no answer page [phone] 05/12/2012, 1:11 PM  Will call GI to see patient, will need further rehab and further medical care.  Code status established above.  Will transfer to [facility] service in AM and PCCM will sign off, please call back if needed.  Patient seen and examined, agree with above note.  I dictated the care and orders written for this patient under my direction.  Koren Bound, M.D. [phone]

## 2012-05-13 ENCOUNTER — Encounter (HOSPITAL_COMMUNITY): Payer: Self-pay | Admitting: Gastroenterology

## 2012-05-13 ENCOUNTER — Encounter (HOSPITAL_COMMUNITY)
Admission: EM | Disposition: A | Discharge status: Home Health | Payer: Self-pay | Source: Home / Self Care | Attending: Internal Medicine

## 2012-05-13 DIAGNOSIS — K222 Esophageal obstruction: Secondary | ICD-10-CM

## 2012-05-13 DIAGNOSIS — K2991 Gastroduodenitis, unspecified, with bleeding: Secondary | ICD-10-CM

## 2012-05-13 DIAGNOSIS — K2971 Gastritis, unspecified, with bleeding: Secondary | ICD-10-CM

## 2012-05-13 HISTORY — PX: ENTEROSCOPY: SHX5533

## 2012-05-13 LAB — GLUCOSE, CAPILLARY
Glucose-Capillary: 93 mg/dL (ref 70–99)
Glucose-Capillary: 116 mg/dL — ABNORMAL HIGH (ref 70–99)

## 2012-05-13 LAB — PROTIME-INR
INR: 2.21 — ABNORMAL HIGH (ref 0.00–1.49)
Prothrombin Time: 23.6 seconds — ABNORMAL HIGH (ref 11.6–15.2)

## 2012-05-13 LAB — BASIC METABOLIC PANEL
GFR calc Af Amer: >90 mL/min (ref >90)
GFR calc non Af Amer: 78 mL/min — ABNORMAL LOW (ref >90)
Glucose, Bld: 104 mg/dL — ABNORMAL HIGH (ref 70–99)
Potassium: 4.2 mEq/L (ref 3.5–5.1)
Sodium: 139 mEq/L (ref 135–145)

## 2012-05-13 LAB — CBC
Hemoglobin: 11.1 g/dL — ABNORMAL LOW (ref 13.0–17.0)
MCHC: 32.7 g/dL (ref 30.0–36.0)
RDW: 13.1 % (ref 11.5–15.5)

## 2012-05-13 SURGERY — ENTEROSCOPY
Anesthesia: Moderate Sedation

## 2012-05-13 MED ORDER — FENTANYL CITRATE 0.05 MG/ML IJ SOLN
INTRAMUSCULAR | Status: DC | PRN
  Administered 2012-05-13 (×2): 12.5 ug via INTRAVENOUS

## 2012-05-13 MED ORDER — INSULIN ASPART 100 UNIT/ML ~~LOC~~ SOLN
0.0000 [IU] | Freq: Three times a day (TID) | SUBCUTANEOUS | Status: DC
  Administered 2012-05-13: 1 [IU] via SUBCUTANEOUS
  Administered 2012-05-14: 3 [IU] via SUBCUTANEOUS
  Administered 2012-05-14 – 2012-05-15 (×2): 1 [IU] via SUBCUTANEOUS
  Administered 2012-05-15: 2 [IU] via SUBCUTANEOUS
  Administered 2012-05-15: 1 [IU] via SUBCUTANEOUS

## 2012-05-13 MED ORDER — MIDAZOLAM HCL 10 MG/2ML IJ SOLN
INTRAMUSCULAR | Status: DC | PRN
  Administered 2012-05-13 (×2): 1 mg via INTRAVENOUS

## 2012-05-13 MED ORDER — BUTAMBEN-TETRACAINE-BENZOCAINE 2-2-14 % EX AERO
INHALATION_SPRAY | CUTANEOUS | Status: DC | PRN
  Administered 2012-05-13: 2 via TOPICAL

## 2012-05-13 MED ORDER — PANTOPRAZOLE SODIUM 40 MG PO TBEC
40.0000 mg | DELAYED_RELEASE_TABLET | Freq: Two times a day (BID) | ORAL | Status: DC
  Administered 2012-05-13 – 2012-05-15 (×5): 40 mg via ORAL
  Filled 2012-05-13 (×7): qty 1

## 2012-05-13 MED ORDER — SODIUM CHLORIDE 0.9 % IV SOLN
INTRAVENOUS | Status: DC
  Administered 2012-05-13: 500 mL via INTRAVENOUS

## 2012-05-13 NOTE — Progress Notes (Signed)
See endoscopy results: The patient has erosive gastritis which likely was the source for his bleeding in the face of anticoagulation therapy. Recommend high dose PPI therapy (twice a day). Also noted were an esophageal stricture and very large hiatal hernia.

## 2012-05-13 NOTE — H&P (View-Only) (Signed)
[facility] PROGRESS NOTE  [name] ZOX:[id number] DOB: 07-16-1932 DOA: 05/06/2012 PCP: Kristian Covey, MD  Assessment/Plan: 11 yowm remote smoker maintained on ACEI chronically admitted on 12/4 w/ acute respiratory failure in setting of presumed bronchitis   1) Acute respiratory failure in setting of purulent bronchitis vs CAP superimposed on what looks like chronic interstitial lung disease.  Patient on 3 L. Oxygen was increase by respiratory therapist. I will recheck oxygen saturation. Patient denies worsening SOB.  If no increase oxygen required or decrease saturation ok to proceed with endoscopy today.  -Empirical levaquin x 10 days to complete on 12-14 -Scheduled nebs -Avoid ace.  -Oral prednisone.  2) afib w/ RVR  - Echo 03/09/12 Left ventricle: The cavity size was normal. Wall thickness was normal. Systolic function was normal. The estimatedejection fraction was in the range of 50% to 55%. Mitral valve: Mild regurgitation. Left atrium: The atrium was mildly dilated. Right ventricle: The cavity size was mildly dilated. Systolic function was mildly reduced. Right atrium: The atrium was mildly dilated.Pulmonary arteries: PA peak pressure: 34mm Hg  Continue with Bisoprolol.   3) GERD & Dysphagia  - SLP eval 12/6 > reg/ thin liquid  4) Hyperglycemia  Aggravated by steroids. Monitor. Change SSI to sensitive.   5) H/o cva & Bells palsy  No acute issues   6)Melena: HB stable, for possible endoscopy today.   Code Status: PAR Family Communication: Family at bedside updated.    Consultants:  GI  Procedures:  None.  Antibiotics:  Levaquin 12-07  HPI/Subjective: Patient feeling well, denies worsening shortness of breath. Feeling same as yesterday. Had dark stool today.   Objective: Filed Vitals:   05/12/12 2120 05/13/12 0437 05/13/12 0500 05/13/12 0928  BP: 121/73 100/58    Pulse: 88 79    Temp: 98.1 F (36.7 C) 98.2 F (36.8 C)    TempSrc: Oral Oral     Resp: 18 20    Height:      Weight:   78 kg (171 lb 15.3 oz)   SpO2: 96% 96%  91%    Intake/Output Summary (Last 24 hours) at 05/13/12 1039 Last data filed at 05/13/12 0600  Gross per 24 hour  Intake    440 ml  Output    650 ml  Net   -210 ml   Filed Weights   05/10/12 0654 05/11/12 0520 05/13/12 0500  Weight: 76.7 kg (169 lb 1.5 oz) 76.2 kg (167 lb 15.9 oz) 78 kg (171 lb 15.3 oz)    Exam:   General:  No distress.  Cardiovascular: S 1, S 2 RRR  Respiratory: No wheezes, ronchus.  Abdomen:  Bs present, soft, NT  Data Reviewed: Basic Metabolic Panel:  Lab 05/13/12 8119 05/12/12 0515 05/08/12 0340 05/07/12 0330  NA 139 139 137 134*  K 4.2 4.5 5.1 3.1*  CL 97 100 102 96  CO2 37* 34* 27 25  GLUCOSE 104* 134* 183* 185*  BUN 47* 48* 40* 25*  CREATININE 0.96 0.86 0.97 0.89  CALCIUM 8.8 9.0 9.1 8.9  MG -- -- -- --  PHOS -- -- -- --   Liver Function Tests:  Lab 05/08/12 0340  AST 32  ALT 21  ALKPHOS 51  BILITOT 0.4  PROT 6.2  ALBUMIN 2.7*   No results found for this basename: LIPASE:5,AMYLASE:5 in the last 168 hours No results found for this basename: AMMONIA:5 in the last 168 hours CBC:  Lab 05/13/12 0519 05/12/12 0515 05/09/12 0610 05/08/12 0340 05/07/12  0330  WBC 12.4* 9.8 11.1* 12.6* 8.8  NEUTROABS -- -- -- -- --  HGB 11.1* 11.7* 12.6* 12.6* 13.4  HCT 33.9* 35.5* 38.7* 38.1* 39.7  MCV 90.6 90.1 90.6 89.6 87.8  PLT 227 210 172 165 139*   Cardiac Enzymes: No results found for this basename: CKTOTAL:5,CKMB:5,CKMBINDEX:5,TROPONINI:5 in the last 168 hours BNP (last 3 results) No results found for this basename: PROBNP:3 in the last 8760 hours CBG:  Lab 05/13/12 0735 05/12/12 2119 05/12/12 1656 05/12/12 1125 05/12/12 0733  GLUCAP 93 118* 204* 133* 105*    Recent Results (from the past 240 hour(s))  CULTURE, BLOOD (ROUTINE X 2)     Status: Normal   Collection Time   05/06/12 10:25 AM      Component Value Range Status Comment   Specimen Description  BLOOD LEFT FOREARM   Final    Special Requests BOTTLES DRAWN AEROBIC AND ANAEROBIC   Final    Culture  Setup Time 05/06/2012 16:52   Final    Culture     Final    Value:        BLOOD CULTURE RECEIVED NO GROWTH TO DATE CULTURE WILL BE HELD FOR 5 DAYS BEFORE ISSUING A FINAL NEGATIVE REPORT   Report Status 05/12/2012 FINAL   Final   CULTURE, BLOOD (ROUTINE X 2)     Status: Normal   Collection Time   05/06/12 10:30 AM      Component Value Range Status Comment   Specimen Description BLOOD RIGHT ANTECUBITAL   Final    Special Requests BOTTLES DRAWN AEROBIC AND ANAEROBIC   Final    Culture  Setup Time 05/06/2012 16:52   Final    Culture     Final    Value:        BLOOD CULTURE RECEIVED NO GROWTH TO DATE CULTURE WILL BE HELD FOR 5 DAYS BEFORE ISSUING A FINAL NEGATIVE REPORT   Report Status 05/12/2012 FINAL   Final   URINE CULTURE     Status: Normal   Collection Time   05/06/12 10:49 AM      Component Value Range Status Comment   Specimen Description URINE, RANDOM   Final    Special Requests NONE   Final    Culture  Setup Time 05/06/2012 18:46   Final    Colony Count NO GROWTH   Final    Culture NO GROWTH   Final    Report Status 05/07/2012 FINAL   Final   MRSA PCR SCREENING     Status: Normal   Collection Time   05/06/12  5:27 PM      Component Value Range Status Comment   MRSA by PCR NEGATIVE  NEGATIVE Final      Studies: No results found.  Scheduled Meds:   . bisoprolol  5 mg Oral Daily  . doxazosin  4 mg Oral Daily  . dutasteride  0.5 mg Oral Daily  . gabapentin  600 mg Oral TID  . insulin aspart  0-20 Units Subcutaneous TID WC  . insulin aspart  0-5 Units Subcutaneous QHS  . ipratropium  0.5 mg Nebulization QID  . levalbuterol  0.63 mg Nebulization QID  . levofloxacin  500 mg Oral Daily  . pantoprazole  40 mg Oral Daily  . predniSONE  40 mg Oral Q breakfast  . [DISCONTINUED] warfarin  1 mg Oral ONCE-1800  . [DISCONTINUED] Warfarin - Pharmacist Dosing Inpatient    Does not apply q1800   Continuous Infusions:  Active Problems:  Atrial fibrillation with RVR  Acute respiratory failure  Dysphagia  HTN (hypertension)  H/O: CVA (cerebrovascular accident)  GERD (gastroesophageal reflux disease)  Purulent bronchitis  Acute posthemorrhagic anemia    Time spent: 35 minutes    Joleigh Mineau  [facility] Pager [phone]. If 8PM-8AM, please contact night-coverage at [email or web address], password [facility] 05/13/2012, 10:39 AM  LOS: 7 days

## 2012-05-13 NOTE — Op Note (Signed)
[facility] [address] [place], [address]   OPERATIVE PROCEDURE REPORT  PATIENT: Sajjad, Honea.  MR#: [id number] BIRTHDATE: 12-10-1932 , 79  yrs. old GENDER: Male ENDOSCOPIST: Louis Meckel, MD REFERRED BY: PROCEDURE DATE: 05/13/2012 PROCEDURE:   Small bowel enteroscopy with biopsy ASA CLASS:   Class III INDICATIONS:1.  melenic bleeding. MEDICATIONS: These medications were titrated to patient response per physician's verbal order, Versed 2 mg IV, and Fentanyl 25 mcg IV  TOPICAL ANESTHETIC:   Cetacaine Spray  DESCRIPTION OF PROCEDURE:   After the risks benefits and alternatives of the procedure were thoroughly explained, informed consent was obtained.  The Pentax EC-[id number] (s/n [id number]) endoscope was introduced through the mouth  and advanced to the proximal jejunum jejunum , limited by Without limitations.   The instrument was slowly withdrawn as the mucosa was fully examined.    Gastritis was found in the fundus and body. There were several linear superficial erosions with pigmented days along the greater curvature of the gastric fundus and body. Biopsies were taken.  At the GE junction there was a moderate stricture. The 12 mm scope traversed the stricture without resistance. A very large sliding hiatal hernia measuring 10-12 cm was noted. The remainder of the exam including the duodenum and proximal portion of the jejunum were normal.  Retroflexed views revealed no abnormalities.    The scope was then withdrawn from the patient and the procedure terminated.  COMPLICATIONS: There were no complications.  ENDOSCOPIC IMPRESSION: 1.  Linear gastric erosions 2.  Large hiatal hernia 3.  esophageal stricture.  RECOMMENDATIONS: PPI therapy BID Await biopsy results. REPEAT EXAM:  _______________________________ eSignedLouis Meckel, MD 05/13/2012 3:52 PM   CC:  PATIENT NAME:  Hisham, Provence. MR#: [id number]

## 2012-05-13 NOTE — Progress Notes (Signed)
Pt had a large maroon stool this am; will continue to monitor BMs

## 2012-05-13 NOTE — Interval H&P Note (Signed)
History and Physical Interval Note:  05/13/2012 3:35 PM  [name]  has presented today for surgery, with the diagnosis of GIB  The various methods of treatment have been discussed with the patient and family. After consideration of risks, benefits and other options for treatment, the patient has consented to  Procedure(s) (LRB) with comments: ENTEROSCOPY (N/A) as a surgical intervention .  The patient's history has been reviewed, patient examined, no change in status, stable for surgery.  I have reviewed the patient's chart and labs.  Questions were answered to the patient's satisfaction.    The recent H&P (dated *05/13/12**) was reviewed, the patient was examined and there is no change in the patients condition since that H&P was completed.   [name]  05/13/2012, 3:35 PM    [name]

## 2012-05-13 NOTE — Progress Notes (Signed)
SLP Cancellation Note  Patient Details Name: [name] MRN: [id number] DOB: [birth year]   Cancelled treatment:        Pt npo for endoscopy.  SLP to return at later time.   Donavan Burnet, MS [facility] SLP [phone]

## 2012-05-13 NOTE — Progress Notes (Addendum)
[facility] PROGRESS NOTE  Natthew J Pine MRN:[id number] DOB: 11/12/1932 DOA: 05/06/2012 PCP: BURCHETTE,BRUCE W, MD  Assessment/Plan: 79 yowm remote smoker maintained on ACEI chronically admitted on 12/4 w/ acute respiratory failure in setting of presumed bronchitis   1) Acute respiratory failure in setting of purulent bronchitis vs CAP superimposed on what looks like chronic interstitial lung disease.  Patient on 3 L. Oxygen was increase by respiratory therapist. I will recheck oxygen saturation. Patient denies worsening SOB.  If no increase oxygen required or decrease saturation ok to proceed with endoscopy today.  -Empirical levaquin x 10 days to complete on 12-14 -Scheduled nebs -Avoid ace.  -Oral prednisone.  2) afib w/ RVR  - Echo 03/09/12 Left ventricle: The cavity size was normal. Wall thickness was normal. Systolic function was normal. The estimatedejection fraction was in the range of 50% to 55%. Mitral valve: Mild regurgitation. Left atrium: The atrium was mildly dilated. Right ventricle: The cavity size was mildly dilated. Systolic function was mildly reduced. Right atrium: The atrium was mildly dilated.Pulmonary arteries: PA peak pressure: 34mm Hg  Continue with Bisoprolol.   3) GERD & Dysphagia  - SLP eval 12/6 > reg/ thin liquid  4) Hyperglycemia  Aggravated by steroids. Monitor. Change SSI to sensitive.   5) H/o cva & Bells palsy  No acute issues   6)Melena: HB stable, for possible endoscopy today.   Code Status: PAR Family Communication: Family at bedside updated.    Consultants:  GI  Procedures:  None.  Antibiotics:  Levaquin 12-07  HPI/Subjective: Patient feeling well, denies worsening shortness of breath. Feeling same as yesterday. Had dark stool today.   Objective: Filed Vitals:   05/12/12 2120 05/13/12 0437 05/13/12 0500 05/13/12 0928  BP: 121/73 100/58    Pulse: 88 79    Temp: 98.1 F (36.7 C) 98.2 F (36.8 C)    TempSrc: Oral Oral     Resp: 18 20    Height:      Weight:   78 kg (171 lb 15.3 oz)   SpO2: 96% 96%  91%    Intake/Output Summary (Last 24 hours) at 05/13/12 1039 Last data filed at 05/13/12 0600  Gross per 24 hour  Intake    440 ml  Output    650 ml  Net   -210 ml   Filed Weights   05/10/12 0654 05/11/12 0520 05/13/12 0500  Weight: 76.7 kg (169 lb 1.5 oz) 76.2 kg (167 lb 15.9 oz) 78 kg (171 lb 15.3 oz)    Exam:   General:  No distress.  Cardiovascular: S 1, S 2 RRR  Respiratory: No wheezes, ronchus.  Abdomen:  Bs present, soft, NT  Data Reviewed: Basic Metabolic Panel:  Lab 05/13/12 0519 05/12/12 0515 05/08/12 0340 05/07/12 0330  NA 139 139 137 134*  K 4.2 4.5 5.1 3.1*  CL 97 100 102 96  CO2 37* 34* 27 25  GLUCOSE 104* 134* 183* 185*  BUN 47* 48* 40* 25*  CREATININE 0.96 0.86 0.97 0.89  CALCIUM 8.8 9.0 9.1 8.9  MG -- -- -- --  PHOS -- -- -- --   Liver Function Tests:  Lab 05/08/12 0340  AST 32  ALT 21  ALKPHOS 51  BILITOT 0.4  PROT 6.2  ALBUMIN 2.7*   No results found for this basename: LIPASE:5,AMYLASE:5 in the last 168 hours No results found for this basename: AMMONIA:5 in the last 168 hours CBC:  Lab 05/13/12 0519 05/12/12 0515 05/09/12 0610 05/08/12 0340 05/07/12   0330  WBC 12.4* 9.8 11.1* 12.6* 8.8  NEUTROABS -- -- -- -- --  HGB 11.1* 11.7* 12.6* 12.6* 13.4  HCT 33.9* 35.5* 38.7* 38.1* 39.7  MCV 90.6 90.1 90.6 89.6 87.8  PLT 227 210 172 165 139*   Cardiac Enzymes: No results found for this basename: CKTOTAL:5,CKMB:5,CKMBINDEX:5,TROPONINI:5 in the last 168 hours BNP (last 3 results) No results found for this basename: PROBNP:3 in the last 8760 hours CBG:  Lab 05/13/12 0735 05/12/12 2119 05/12/12 1656 05/12/12 1125 05/12/12 0733  GLUCAP 93 118* 204* 133* 105*    Recent Results (from the past 240 hour(s))  CULTURE, BLOOD (ROUTINE X 2)     Status: Normal   Collection Time   05/06/12 10:25 AM      Component Value Range Status Comment   Specimen Description  BLOOD LEFT FOREARM   Final    Special Requests BOTTLES DRAWN AEROBIC AND ANAEROBIC 5ML   Final    Culture  Setup Time 05/06/2012 16:52   Final    Culture     Final    Value:        BLOOD CULTURE RECEIVED NO GROWTH TO DATE CULTURE WILL BE HELD FOR 5 DAYS BEFORE ISSUING A FINAL NEGATIVE REPORT   Report Status 05/12/2012 FINAL   Final   CULTURE, BLOOD (ROUTINE X 2)     Status: Normal   Collection Time   05/06/12 10:30 AM      Component Value Range Status Comment   Specimen Description BLOOD RIGHT ANTECUBITAL   Final    Special Requests BOTTLES DRAWN AEROBIC AND ANAEROBIC 5ML   Final    Culture  Setup Time 05/06/2012 16:52   Final    Culture     Final    Value:        BLOOD CULTURE RECEIVED NO GROWTH TO DATE CULTURE WILL BE HELD FOR 5 DAYS BEFORE ISSUING A FINAL NEGATIVE REPORT   Report Status 05/12/2012 FINAL   Final   URINE CULTURE     Status: Normal   Collection Time   05/06/12 10:49 AM      Component Value Range Status Comment   Specimen Description URINE, RANDOM   Final    Special Requests NONE   Final    Culture  Setup Time 05/06/2012 18:46   Final    Colony Count NO GROWTH   Final    Culture NO GROWTH   Final    Report Status 05/07/2012 FINAL   Final   MRSA PCR SCREENING     Status: Normal   Collection Time   05/06/12  5:27 PM      Component Value Range Status Comment   MRSA by PCR NEGATIVE  NEGATIVE Final      Studies: No results found.  Scheduled Meds:   . bisoprolol  5 mg Oral Daily  . doxazosin  4 mg Oral Daily  . dutasteride  0.5 mg Oral Daily  . gabapentin  600 mg Oral TID  . insulin aspart  0-20 Units Subcutaneous TID WC  . insulin aspart  0-5 Units Subcutaneous QHS  . ipratropium  0.5 mg Nebulization QID  . levalbuterol  0.63 mg Nebulization QID  . levofloxacin  500 mg Oral Daily  . pantoprazole  40 mg Oral Daily  . predniSONE  40 mg Oral Q breakfast  . [DISCONTINUED] warfarin  1 mg Oral ONCE-1800  . [DISCONTINUED] Warfarin - Pharmacist Dosing Inpatient    Does not apply q1800   Continuous Infusions:     Active Problems:  Atrial fibrillation with RVR  Acute respiratory failure  Dysphagia  HTN (hypertension)  H/O: CVA (cerebrovascular accident)  GERD (gastroesophageal reflux disease)  Purulent bronchitis  Acute posthemorrhagic anemia    Time spent: 35 minutes    Yanessa Hocevar  [facility] Pager [phone]. If 8PM-8AM, please contact night-coverage at [email or web address], password [email or web address] 05/13/2012, 10:39 AM  LOS: 7 days             

## 2012-05-13 NOTE — Progress Notes (Signed)
Nutrition Follow-up  Intervention:   1.  General healthful diet; continue to encourage intake and offer snacks if requested.    Assessment:   Pt admitted with respiratory failure and had reported chronic poor appetite at home.  Pt with variable intake since admission ranging from 20-75% of meals.  Pt typically likes to snacks and at last check with pt had obtained peanut butter crackers from his wife. Pt is unavailable today, went down for endoscopy. Per RN, pt has been NPO today for procedure.  Diet Order:  NPO  Meds: Scheduled Meds:   . bisoprolol  5 mg Oral Daily  . doxazosin  4 mg Oral Daily  . dutasteride  0.5 mg Oral Daily  . gabapentin  600 mg Oral TID  . insulin aspart  0-9 Units Subcutaneous TID WC  . ipratropium  0.5 mg Nebulization QID  . levalbuterol  0.63 mg Nebulization QID  . levofloxacin  500 mg Oral Daily  . pantoprazole  40 mg Oral BID AC  . predniSONE  40 mg Oral Q breakfast  . [DISCONTINUED] insulin aspart  0-20 Units Subcutaneous TID WC  . [DISCONTINUED] insulin aspart  0-5 Units Subcutaneous QHS  . [DISCONTINUED] pantoprazole  40 mg Oral Daily   Continuous Infusions:   . [DISCONTINUED] sodium chloride 500 mL (05/13/12 1409)   PRN Meds:.[DISCONTINUED] butamben-tetracaine-benzocaine, [DISCONTINUED] fentaNYL, [DISCONTINUED] midazolam   CMP     Component Value Date/Time   NA 139 05/13/2012 0519   K 4.2 05/13/2012 0519   CL 97 05/13/2012 0519   CO2 37* 05/13/2012 0519   GLUCOSE 104* 05/13/2012 0519   BUN 47* 05/13/2012 0519   CREATININE 0.96 05/13/2012 0519   CALCIUM 8.8 05/13/2012 0519   PROT 6.2 05/08/2012 0340   ALBUMIN 2.7* 05/08/2012 0340   AST 32 05/08/2012 0340   ALT 21 05/08/2012 0340   ALKPHOS 51 05/08/2012 0340   BILITOT 0.4 05/08/2012 0340   GFRNONAA 78* 05/13/2012 0519   GFRAA >90 05/13/2012 0519    CBG (last 3)   Basename 05/13/12 0735 05/12/12 2119 05/12/12 1656  GLUCAP 93 118* 204*     Intake/Output Summary (Last 24 hours) at  05/13/12 1622 Last data filed at 05/13/12 1454  Gross per 24 hour  Intake    740 ml  Output    900 ml  Net   -160 ml    Weight Status:  171 lbs, stable  Re-estimated needs:  1890-2110 kcal, 90-105g protein  Nutrition Dx:  Unintended wt loss  Monitor:   1. Food/Beverage; diet advancement with tolerance.  Met, pt seems to be eating per his usual 2. Head/neck; no s/s aspiration.  Met. Ongoing.   Loyce Dys, MS RD LDN Clinical Inpatient Dietitian Pager: [phone] Weekend/After hours pager: [phone]

## 2012-05-13 NOTE — Progress Notes (Signed)
Physical Therapy Treatment Patient Details Name: [name] MRN: [id number] DOB: [birth year] Today's Date: 05/13/2012 Time: 4098-1191 PT Time Calculation (min): 19 min  PT Assessment / Plan / Recommendation Comments on Treatment Session  Pt somewhat improved today and not as fatigued.  He did have to take a standing rest break halfway through ambulation, however did well and did some exercises.      Follow Up Recommendations  Home health PT;Supervision - Intermittent     Does the patient have the potential to tolerate intense rehabilitation     Barriers to Discharge        Equipment Recommendations  Rolling walker with 5" wheels    Recommendations for Other Services    Frequency Min 3X/week   Plan Discharge plan remains appropriate    Precautions / Restrictions Precautions Precautions: Fall Precaution Comments: monitor O2 sats.  Restrictions Weight Bearing Restrictions: No   Pertinent Vitals/Pain No pain    Mobility  Bed Mobility Bed Mobility: Supine to Sit Supine to Sit: 5: Supervision;HOB elevated;With rails Details for Bed Mobility Assistance: Supervision for safety with min cues for hand placement/technique.  Transfers Transfers: Stand to Sit;Sit to Stand Sit to Stand: 4: Min guard;With upper extremity assist;From bed;From elevated surface Stand to Sit: 4: Min guard;With upper extremity assist;With armrests;To chair/3-in-1 Details for Transfer Assistance: Min/guard for safety with min cues for hand placement when sitting/standing.  Ambulation/Gait Ambulation/Gait Assistance: 4: Min guard Ambulation Distance (Feet): 140 Feet Assistive device: Rolling walker Ambulation/Gait Assistance Details: Cues for maintaining position inside of RW.  Pt had to take short standing break halfway through ambulation due to fatigue.  Gait Pattern: Step-through pattern;Decreased stride length;Trunk flexed Gait velocity: decreased General Gait Details: ambulated on 4L O2 with O2  sats at 94% and HR at 121 Stairs: No Wheelchair Mobility Wheelchair Mobility: No    Exercises General Exercises - Lower Extremity Ankle Circles/Pumps: AROM;Both;20 reps Long Arc Quad: Strengthening;Both;10 reps Hip ABduction/ADduction: Strengthening;Both;10 reps (pillow squeeze) Hip Flexion/Marching: Strengthening;Both;20 reps   PT Diagnosis:    PT Problem List:   PT Treatment Interventions:     PT Goals Acute Rehab PT Goals PT Goal Formulation: With patient Time For Goal Achievement: 05/24/12 Potential to Achieve Goals: Good Pt will go Supine/Side to Sit: with modified independence PT Goal: Supine/Side to Sit - Progress: Updated due to goal met Pt will go Sit to Stand: with modified independence PT Goal: Sit to Stand - Progress: Progressing toward goal Pt will Ambulate: >150 feet;with modified independence;with least restrictive assistive device PT Goal: Ambulate - Progress: Progressing toward goal  Visit Information  Last PT Received On: 05/13/12 Assistance Needed: +1    Subjective Data  Subjective: I feel the same Patient Stated Goal: to get back home.    Cognition  Overall Cognitive Status: Appears within functional limits for tasks assessed/performed Arousal/Alertness: Awake/alert Orientation Level: Appears intact for tasks assessed Behavior During Session: [facility] for tasks performed    Balance     End of Session PT - End of Session Equipment Utilized During Treatment: Oxygen;Gait belt Activity Tolerance: Patient limited by fatigue Patient left: in chair;with call bell/phone within reach;with family/visitor present;with chair alarm set Nurse Communication: Mobility status   GP     Page, Meribeth Mattes 05/13/2012, 1:16 PM

## 2012-05-13 NOTE — Progress Notes (Signed)
ANTICOAGULATION CONSULT NOTE - Follow Up Consult  Pharmacy Consult for warfarin Indication: atrial fibrillation  Allergies  Allergen Reactions  . Penicillins     hives   Patient Measurements: Height: 5\' 11"  (180.3 cm) Weight: 171 lb 15.3 oz (78 kg) IBW/kg (Calculated) : 75.3   Vital Signs: Temp: 98.2 F (36.8 C) (12/11 0437) Temp src: Oral (12/11 0437) BP: 100/58 mmHg (12/11 0437) Pulse Rate: 79  (12/11 0437)  Labs:  Basename 05/13/12 0519 05/12/12 0515 05/11/12 0515  HGB 11.1* 11.7* --  HCT 33.9* 35.5* --  PLT 227 210 --  APTT -- -- --  LABPROT 23.6* 27.1* 28.4*  INR 2.21* 2.67* 2.84*  HEPARINUNFRC -- -- --  CREATININE 0.96 0.86 --  CKTOTAL -- -- --  CKMB -- -- --  TROPONINI -- -- --   Estimated Creatinine Clearance: 68.6 ml/min (by C-G formula based on Cr of 0.96).  Medications:  Scheduled:     . bisoprolol  5 mg Oral Daily  . doxazosin  4 mg Oral Daily  . dutasteride  0.5 mg Oral Daily  . gabapentin  600 mg Oral TID  . insulin aspart  0-9 Units Subcutaneous TID WC  . ipratropium  0.5 mg Nebulization QID  . levalbuterol  0.63 mg Nebulization QID  . levofloxacin  500 mg Oral Daily  . pantoprazole  40 mg Oral Daily  . predniSONE  40 mg Oral Q breakfast  . [DISCONTINUED] insulin aspart  0-20 Units Subcutaneous TID WC  . [DISCONTINUED] insulin aspart  0-5 Units Subcutaneous QHS  . [DISCONTINUED] warfarin  1 mg Oral ONCE-1800  . [DISCONTINUED] Warfarin - Pharmacist Dosing Inpatient   Does not apply q1800    Assessment: 62 YOM admitted with cough, SOB, and wheezing. Patient on warfarin therapy for h/o afib and CVA. INR subtherapeutic at admission.  INR therapeutic (2.2).  Last dose of warfarin given 05/09/12  RN noted black stools 12/10 and maroon stool 12/11.  Likely EGD/enteroscopy 12/11 to assess source.  Potential drug interactions with warfarin (potentiate effect): levofloxacin  Goal of Therapy:  INR 2-3 Monitor platelets by anticoagulation  protocol: Yes   Plan:   Continue to hold Warfarin  Daily INR  Lynann Beaver PharmD, BCPS Pager [phone] 05/13/2012 11:50 AM

## 2012-05-14 ENCOUNTER — Encounter: Payer: Self-pay | Admitting: Gastroenterology

## 2012-05-14 ENCOUNTER — Encounter (HOSPITAL_COMMUNITY): Payer: Self-pay | Admitting: Gastroenterology

## 2012-05-14 DIAGNOSIS — K259 Gastric ulcer, unspecified as acute or chronic, without hemorrhage or perforation: Secondary | ICD-10-CM

## 2012-05-14 LAB — GLUCOSE, CAPILLARY: Glucose-Capillary: 137 mg/dL — ABNORMAL HIGH (ref 70–99)

## 2012-05-14 LAB — PROTIME-INR
INR: 1.66 — ABNORMAL HIGH (ref 0.00–1.49)
Prothrombin Time: 19.1 seconds — ABNORMAL HIGH (ref 11.6–15.2)

## 2012-05-14 LAB — CBC
HCT: 29.6 % — ABNORMAL LOW (ref 39.0–52.0)
Hemoglobin: 9.9 g/dL — ABNORMAL LOW (ref 13.0–17.0)
MCH: 30.3 pg (ref 26.0–34.0)
MCHC: 33.4 g/dL (ref 30.0–36.0)
MCV: 90.5 fL (ref 78.0–100.0)
RBC: 3.27 MIL/uL — ABNORMAL LOW (ref 4.22–5.81)

## 2012-05-14 LAB — BASIC METABOLIC PANEL
BUN: 34 mg/dL — ABNORMAL HIGH (ref 6–23)
CO2: 36 mEq/L — ABNORMAL HIGH (ref 19–32)
Calcium: 8.6 mg/dL (ref 8.4–10.5)
Glucose, Bld: 126 mg/dL — ABNORMAL HIGH (ref 70–99)
Sodium: 139 mEq/L (ref 135–145)

## 2012-05-14 MED ORDER — FERROUS SULFATE 325 (65 FE) MG PO TABS
325.0000 mg | ORAL_TABLET | Freq: Two times a day (BID) | ORAL | Status: DC
  Administered 2012-05-15 (×2): 325 mg via ORAL
  Filled 2012-05-14 (×4): qty 1

## 2012-05-14 MED ORDER — WARFARIN - PHARMACIST DOSING INPATIENT: Freq: Every day | Status: DC

## 2012-05-14 MED ORDER — WARFARIN SODIUM 2.5 MG PO TABS
2.5000 mg | ORAL_TABLET | Freq: Once | ORAL | Status: AC
  Administered 2012-05-14: 2.5 mg via ORAL
  Filled 2012-05-14: qty 1

## 2012-05-14 MED FILL — Glycopyrrolate Inj 0.2 MG/ML: INTRAMUSCULAR | Qty: 1 | Status: AC

## 2012-05-14 NOTE — Clinical Documentation Improvement (Signed)
PNEUMONIA DOCUMENTATION CLARIFICATION QUERY  THIS DOCUMENT IS NOT A PERMANENT PART OF THE MEDICAL RECORD  TO RESPOND TO THE THIS QUERY, FOLLOW THE INSTRUCTIONS BELOW:  1. If needed, update documentation for the patient's encounter via the notes activity.  2. Access this query again and click edit on the [employer].  3. After updating, or not, click F2 to complete all highlighted (required) fields concerning your review. Select "additional documentation in the medical record" OR "no additional documentation provided".  4. Click Sign note button.  5. The deficiency will fall out of your InBasket *Please let us know if you are not able to complete this workflow by phone or e-mail (listed below).  Please update your documentation within the medical record to reflect your response to this query.                                                                                    05/14/12  Dear Valentina Shaggy, [facility]  In a better effort to capture your patient's severity of illness, reflect appropriate length of stay and utilization of resources, a review of the patient medical record has revealed the following indicators.    Based on your clinical judgment, please clarify and document in a progress note and/or discharge summary the clinical condition associated with the following supporting information:  In responding to this query please exercise your independent judgment.  The fact that a query is asked, does not imply that any particular answer is desired or expected.  Clarification Needed  According to CXR 05/09/12 pt with concern for pneumonia.  Please clarify if you agree pneumonia is r/o out or considered a working diagnosis or other diagnoses and document in pn or d/c summary    Possible Clinical Conditions?   _______Aspiration Pneumonia (POA?) _______Gram Negative Pneumonia (POA?)  Bacterial pneumonia, specify type if known (POA?) _______Klebsiella PNA _______E  Coli PNA _______Pseudomonas PNA _______Candidiasis PNA _______Staph/MRSA PNA _______Strep PNA _______Pneumococcal PNA _______H Influenza PNA _______H Para Influenza PNA  _______Viral PNA (POA?)  _______Pneumonia (CAP, HAP) (POA?) _______Other Condition____________________ _______Cannot Clinically Determine     Supporting Information:  Risk Factors:  Acute Resp failuire, AFIB, Dysphagia, HTN, purulent Bronchitis, Chronic aspiration, deconditioning Signs & Symptoms Dysphagia, Aspiration syndrome,   HP He has h/o CVA so chronic aspiration could certainly be a consideration.     Diagnostics: Lab:  Component     Latest Ref Rng 05/14/2012         4:45 AM  WBC     4.0 - 10.5 K/uL 12.3 (H)   Component     Latest Ref Rng 05/08/2012         3:40 AM  WBC     4.0 - 10.5 K/uL 12.6 (H)   Component     Latest Ref Rng 05/09/2012         6:10 AM  WBC     4.0 - 10.5 K/uL 11.1 (H)   Component     Latest Ref Rng 05/06/2012        10:25 AM  Neutrophils Relative     43 - 77 % 87 (H)   Radiology CXR 05/09/12 pcxr 12/7  The left retrocardiac density is concerning for pneumonia. Small   left effusion.   Left upper lobe and right basilar atelectasis.   Bilateral hilar fullness, right greater than left. Cannot exclude   adenopathy.   Treatment: Monitoring Atrovent 02 @ 3L/[place]    You may use possible, probable, or suspect with inpatient documentation. possible, probable, suspected diagnoses MUST be documented at the time of discharge  Reviewed:  no additional documentation provided ljh  Thank You,  Enis Slipper  RN, BSN, MSN/Inf, CCDS Clinical Documentation Specialist [facility] Dept Pager: [phone] / E-mail: [email or web address]   Health Information Management [place]

## 2012-05-14 NOTE — Progress Notes (Signed)
Speech Language Pathology Dysphagia Treatment Patient Details Name: [name] MRN: [id number] DOB: 1933-03-14 Today's Date: 05/14/2012 Time: 9147-8295 SLP Time Calculation (min): 11 min  Assessment / Plan / Recommendation Clinical Impression  Pt seen to assure tolerance of diet and completion of education,  note pt s/p EGD findings of erosive esophagitis and distal stricture per report.  Intake has been 50-75% and pt is afebrile.  Observed pt swallowing water without difficulties.  Pt states his swallowing is at it's baseline.  Advised to try wax on dentition as ill fitting dentures do not allow adequate mastication. Pt acknowledged he is slowing down when eating , he did state he "got choked" one time on breakfast.  Xerostomia continues for which slp provided tips.  All education completed at this time - no further slp input for this pt with multfactorial dysphagia.       Diet Recommendation  Continue with Current Diet: Regular;Thin liquid    SLP Plan All goals met   Pertinent Vitals/Pain Afebrile, no pain   Swallowing Goals  SLP Swallowing Goals Swallow Study Goal #2 - Progress: Met  General Temperature Spikes Noted: No Respiratory Status: Supplemental O2 delivered via (comment) Behavior/Cognition: Alert;Cooperative Oral Cavity - Dentition: Dentures, top (ill fitting-encouraged pt to try "wax" to aid fitting) Patient Positioning: Upright in chair  Oral Cavity - Oral Hygiene   oral cavity clear  Dysphagia Treatment Treatment focused on: Skilled observation of diet tolerance;Patient/family/caregiver education Family/Caregiver Educated: pt Treatment Methods/Modalities: Skilled observation Patient observed directly with PO's: Yes Type of PO's observed: Thin liquids Feeding: Able to feed self Liquids provided via: Straw Oral Phase Signs & Symptoms:  (no clinical indications of aspiration) Amount of cueing: Minimal (to precautions)   GO     Darrell Burnet, MS [facility]  SLP [phone]

## 2012-05-14 NOTE — Progress Notes (Signed)
ANTICOAGULATION CONSULT NOTE - Follow Up Consult  Pharmacy Consult for warfarin Indication: atrial fibrillation  Allergies  Allergen Reactions  . Penicillins     hives   Patient Measurements: Height: 5\' 11"  (180.3 cm) Weight: 173 lb 8 oz (78.7 kg) IBW/kg (Calculated) : 75.3   Vital Signs: Temp: 97.7 F (36.5 C) (12/12 0523) Temp src: Oral (12/12 0523) BP: 124/74 mmHg (12/12 0523)  Labs:  Basename 05/14/12 0445 05/13/12 0519 05/12/12 0515  HGB 9.9* 11.1* --  HCT 29.6* 33.9* 35.5*  PLT 229 227 210  APTT -- -- --  LABPROT 19.1* 23.6* 27.1*  INR 1.66* 2.21* 2.67*  HEPARINUNFRC -- -- --  CREATININE 0.85 0.96 0.86  CKTOTAL -- -- --  CKMB -- -- --  TROPONINI -- -- --   Estimated Creatinine Clearance: 77.5 ml/min (by C-G formula based on Cr of 0.85).  Medications:  Scheduled:     . bisoprolol  5 mg Oral Daily  . doxazosin  4 mg Oral Daily  . dutasteride  0.5 mg Oral Daily  . gabapentin  600 mg Oral TID  . insulin aspart  0-9 Units Subcutaneous TID WC  . ipratropium  0.5 mg Nebulization QID  . levalbuterol  0.63 mg Nebulization QID  . levofloxacin  500 mg Oral Daily  . pantoprazole  40 mg Oral BID AC  . predniSONE  40 mg Oral Q breakfast  . [DISCONTINUED] insulin aspart  0-20 Units Subcutaneous TID WC  . [DISCONTINUED] insulin aspart  0-5 Units Subcutaneous QHS  . [DISCONTINUED] pantoprazole  40 mg Oral Daily    Assessment: 79 YOM admitted with cough, SOB, and wheezing. Patient on warfarin therapy for h/o afib and CVA. INR subtherapeutic at admission.  INR subtherapeutic (1.6).  Last dose of warfarin given 05/09/12  RN noted black stools 12/10 and maroon stool 12/11.  EGD/enteroscopy 12/11 showed erosive gastritis.  Per GI consult, started BID PPI and "OK to reanticoagulate"  Hgb down to 9.9, follow closely  Potential drug interactions with warfarin (potentiate effect): levofloxacin  Goal of Therapy:  INR 2-3 Monitor platelets by anticoagulation protocol:  Yes   Plan:   Resume warfarin 2.5mg  PO today x1  Daily INR  Lynann Beaver PharmD, BCPS Pager [phone] 05/14/2012 10:32 AM

## 2012-05-14 NOTE — Progress Notes (Signed)
I have personally taken an interval history, reviewed the chart, and examined the patient.  I agree with the extender's note, impression and recommendations.  Robert D. Kaplan, MD, FACG [facility] [phone]  

## 2012-05-14 NOTE — Progress Notes (Signed)
[facility] PROGRESS NOTE  [name] NWG:[id number] DOB: 16-Jan-1933 DOA: 05/06/2012 PCP: Kristian Covey, MD  Assessment/Plan: 28 yowm remote smoker maintained on ACEI chronically admitted on 12/4 w/ acute respiratory failure in setting of presumed bronchitis   1) Acute respiratory failure in setting of purulent bronchitis vs CAP superimposed on what looks like chronic interstitial lung disease.  -Empirical levaquin x 10 days to complete on 12-14  -Scheduled nebs  -Avoid ace.  -Oral prednisone.  -Titrate oxygen down  2) afib w/ RVR  - Echo 03/09/12 Left ventricle: The cavity size was normal. Wall thickness was normal. Systolic function was normal. The estimatedejection fraction was in the range of 50% to 55%. Mitral valve: Mild regurgitation. Left atrium: The atrium was mildly dilated. Right ventricle: The cavity size was mildly dilated. Systolic function was mildly reduced. Right atrium: The atrium was mildly dilated.Pulmonary arteries: PA peak pressure: 34mm Hg  Continue with Bisoprolol. Resume coumadin. 3) GERD & Dysphagia  - SLP eval 12/6 > reg/ thin liquid  4) Hyperglycemia  Aggravated by steroids. Monitor. Change SSI to sensitive.  5) H/o cva & Bells palsy  No acute issues  6)Melena, Acute Blood anemia: HB stable, for possible endoscopy 12-11 show Linear gastric erosions. Large hiatal hernia.esophageal stricture. Per GI ok to resume Anticoagulation. Repeat HB in am. Start ferrous sulfate.  Code Status: PAR  Family Communication: Family at bedside, updated.   Consultants:  GI Procedures:  None. Antibiotics:  Levaquin 12-07     HPI/Subjective: Feeling ok. Had 1 BM today dark color. No worsening SOB.  Objective: Filed Vitals:   05/14/12 0943 05/14/12 1355 05/14/12 1500 05/14/12 1615  BP:  116/76    Pulse:  84    Temp:  97.7 F (36.5 C)    TempSrc:  Oral    Resp:  16    Height:      Weight:      SpO2: 95% 99% 94% 95%    Intake/Output Summary (Last  24 hours) at 05/14/12 2107 Last data filed at 05/14/12 1700  Gross per 24 hour  Intake   1200 ml  Output    951 ml  Net    249 ml   Filed Weights   05/11/12 0520 05/13/12 0500 05/14/12 0523  Weight: 76.2 kg (167 lb 15.9 oz) 78 kg (171 lb 15.3 oz) 78.7 kg (173 lb 8 oz)    Exam:   General:  No distress.  Cardiovascular: S 1, S 2 RRR  Respiratory: Crackles bases.  Abdomen: Bs present, soft, nt.  Data Reviewed: Basic Metabolic Panel:  Lab 05/14/12 5784 05/13/12 0519 05/12/12 0515 05/08/12 0340  NA 139 139 139 137  K 4.2 4.2 4.5 5.1  CL 100 97 100 102  CO2 36* 37* 34* 27  GLUCOSE 126* 104* 134* 183*  BUN 34* 47* 48* 40*  CREATININE 0.85 0.96 0.86 0.97  CALCIUM 8.6 8.8 9.0 9.1  MG -- -- -- --  PHOS -- -- -- --   Liver Function Tests:  Lab 05/08/12 0340  AST 32  ALT 21  ALKPHOS 51  BILITOT 0.4  PROT 6.2  ALBUMIN 2.7*   No results found for this basename: LIPASE:5,AMYLASE:5 in the last 168 hours No results found for this basename: AMMONIA:5 in the last 168 hours CBC:  Lab 05/14/12 0445 05/13/12 0519 05/12/12 0515 05/09/12 0610 05/08/12 0340  WBC 12.3* 12.4* 9.8 11.1* 12.6*  NEUTROABS -- -- -- -- --  HGB 9.9* 11.1* 11.7* 12.6* 12.6*  HCT 29.6* 33.9* 35.5* 38.7* 38.1*  MCV 90.5 90.6 90.1 90.6 89.6  PLT 229 227 210 172 165   Cardiac Enzymes: No results found for this basename: CKTOTAL:5,CKMB:5,CKMBINDEX:5,TROPONINI:5 in the last 168 hours BNP (last 3 results) No results found for this basename: PROBNP:3 in the last 8760 hours CBG:  Lab 05/14/12 1702 05/14/12 1144 05/14/12 0717 05/13/12 2131 05/13/12 1645  GLUCAP 182* 138* 109* 202* 149*    Recent Results (from the past 240 hour(s))  CULTURE, BLOOD (ROUTINE X 2)     Status: Normal   Collection Time   05/06/12 10:25 AM      Component Value Range Status Comment   Specimen Description BLOOD LEFT FOREARM   Final    Special Requests BOTTLES DRAWN AEROBIC AND ANAEROBIC   Final    Culture  Setup Time  05/06/2012 16:52   Final    Culture     Final    Value:        BLOOD CULTURE RECEIVED NO GROWTH TO DATE CULTURE WILL BE HELD FOR 5 DAYS BEFORE ISSUING A FINAL NEGATIVE REPORT   Report Status 05/12/2012 FINAL   Final   CULTURE, BLOOD (ROUTINE X 2)     Status: Normal   Collection Time   05/06/12 10:30 AM      Component Value Range Status Comment   Specimen Description BLOOD RIGHT ANTECUBITAL   Final    Special Requests BOTTLES DRAWN AEROBIC AND ANAEROBIC   Final    Culture  Setup Time 05/06/2012 16:52   Final    Culture     Final    Value:        BLOOD CULTURE RECEIVED NO GROWTH TO DATE CULTURE WILL BE HELD FOR 5 DAYS BEFORE ISSUING A FINAL NEGATIVE REPORT   Report Status 05/12/2012 FINAL   Final   URINE CULTURE     Status: Normal   Collection Time   05/06/12 10:49 AM      Component Value Range Status Comment   Specimen Description URINE, RANDOM   Final    Special Requests NONE   Final    Culture  Setup Time 05/06/2012 18:46   Final    Colony Count NO GROWTH   Final    Culture NO GROWTH   Final    Report Status 05/07/2012 FINAL   Final   MRSA PCR SCREENING     Status: Normal   Collection Time   05/06/12  5:27 PM      Component Value Range Status Comment   MRSA by PCR NEGATIVE  NEGATIVE Final      Studies: No results found.  Scheduled Meds:   . bisoprolol  5 mg Oral Daily  . doxazosin  4 mg Oral Daily  . dutasteride  0.5 mg Oral Daily  . ferrous sulfate  325 mg Oral BID WC  . gabapentin  600 mg Oral TID  . insulin aspart  0-9 Units Subcutaneous TID WC  . ipratropium  0.5 mg Nebulization QID  . levalbuterol  0.63 mg Nebulization QID  . levofloxacin  500 mg Oral Daily  . pantoprazole  40 mg Oral BID AC  . predniSONE  40 mg Oral Q breakfast  . Warfarin - Pharmacist Dosing Inpatient   Does not apply q1800   Continuous Infusions:   Active Problems:  Atrial fibrillation with RVR  Acute respiratory failure  Dysphagia  HTN (hypertension)  H/O: CVA (cerebrovascular  accident)  GERD (gastroesophageal reflux disease)  Purulent bronchitis  Acute posthemorrhagic anemia  Gastritis with hemorrhage  Stricture and stenosis of esophagus    Time spent: 25 minutes.    REGALADO,BELKYS  [facility] Pager [phone]. If 8PM-8AM, please contact night-coverage at [email or web address], password [facility] 05/14/2012, 9:07 PM  LOS: 8 days

## 2012-05-14 NOTE — Progress Notes (Signed)
Pt oxygen sat was 94% on RA prior to ambulation.  After ambulating greater than 100 feet, O2 sat was 92% on room air.  Pt exhibited minimal dyspnea while ambulating.

## 2012-05-14 NOTE — Progress Notes (Signed)
Patient ID: [name], male   DOB: 01-24-1933, 78 y.o.   MRN: [id number] [facility] Progress Note  Subjective: Feels OK- eating without difficulty, no specific c/o- he does not know what meds he is on chronically at home  But knows he has a huge hiatal hernia "nothing new"  Objective:  Vital signs in last 24 hours: Temp:  [97.7 F (36.5 C)-98.5 F (36.9 C)] 97.7 F (36.5 C) (12/12 0523) Pulse Rate:  [94-108] 100  (12/11 1625) Resp:  [16-25] 18  (12/12 0523) BP: (110-136)/(63-89) 124/74 mmHg (12/12 0523) SpO2:  [90 %-97 %] 95 % (12/12 0943) Weight:  [173 lb 8 oz (78.7 kg)] 173 lb 8 oz (78.7 kg) (12/12 0523) Last BM Date: 05/12/12 General:   Alert,  Well-developed,elderly male    in NAD Heart:  irRegular rate and rhythm; no murmurs Pulm;clear Abdomen:  Soft, nontender and nondistended. Normal bowel sounds, without guarding, and without rebound.   Extremities:  Without edema. Neurologic:  Alert and  oriented x4;  grossly normal neurologically. Psych:  Alert and cooperative. Normal mood and affect.  Intake/Output from previous day: 12/11 0701 - 12/12 0700 In: 1020 [P.O.:720; I.V.:300] Out: 1050 [Urine:1050] Intake/Output this shift: Total I/O In: 240 [P.O.:240] Out: 1 [Stool:1]  Lab Results:  Basename 05/14/12 0445 05/13/12 0519 05/12/12 0515  WBC 12.3* 12.4* 9.8  HGB 9.9* 11.1* 11.7*  HCT 29.6* 33.9* 35.5*  PLT 229 227 210   BMET  Basename 05/14/12 0445 05/13/12 0519 05/12/12 0515  NA 139 139 139  K 4.2 4.2 4.5  CL 100 97 100  CO2 36* 37* 34*  GLUCOSE 126* 104* 134*  BUN 34* 47* 48*  CREATININE 0.85 0.96 0.86  CALCIUM 8.6 8.8 9.0   PT/INR  Basename 05/14/12 0445 05/13/12 0519  LABPROT 19.1* 23.6*  INR 1.66* 2.21*     Assessment / Plan: 78 yo male with Gi bleeding secondary to erosive gastritis in a huge hiatal hernia- likely will have ongoing issues - needs long term BID PPI rx  Chronic anticoagulation  With hx of CVA and atrial fib- ok  to reanticoagulate. Anemia -secondary to above-stable Gi will sign off -available if needed. Active Problems:  Atrial fibrillation with RVR  Acute respiratory failure  Dysphagia  HTN (hypertension)  H/O: CVA (cerebrovascular accident)  GERD (gastroesophageal reflux disease)  Purulent bronchitis  Acute posthemorrhagic anemia  Gastritis with hemorrhage  Stricture and stenosis of esophagus     LOS: 8 days   Amy Esterwood  05/14/2012, 10:10 AM

## 2012-05-15 DIAGNOSIS — K449 Diaphragmatic hernia without obstruction or gangrene: Secondary | ICD-10-CM

## 2012-05-15 LAB — BASIC METABOLIC PANEL
BUN: 32 mg/dL — ABNORMAL HIGH (ref 6–23)
CO2: 33 mEq/L — ABNORMAL HIGH (ref 19–32)
Chloride: 101 mEq/L (ref 96–112)
GFR calc non Af Amer: 83 mL/min — ABNORMAL LOW (ref >90)
Glucose, Bld: 102 mg/dL — ABNORMAL HIGH (ref 70–99)
Potassium: 3.9 mEq/L (ref 3.5–5.1)
Sodium: 138 mEq/L (ref 135–145)

## 2012-05-15 LAB — CBC
HCT: 30.1 % — ABNORMAL LOW (ref 39.0–52.0)
Hemoglobin: 9.9 g/dL — ABNORMAL LOW (ref 13.0–17.0)
MCHC: 32.9 g/dL (ref 30.0–36.0)
RBC: 3.31 MIL/uL — ABNORMAL LOW (ref 4.22–5.81)
WBC: 9.6 10*3/uL (ref 4.0–10.5)

## 2012-05-15 LAB — PROTIME-INR
INR: 1.32 (ref 0.00–1.49)
Prothrombin Time: 16.1 seconds — ABNORMAL HIGH (ref 11.6–15.2)

## 2012-05-15 MED ORDER — LEVOFLOXACIN 500 MG PO TABS: 500.0000 mg | ORAL_TABLET | Freq: Every day | ORAL | Status: DC

## 2012-05-15 MED ORDER — FERROUS SULFATE 325 (65 FE) MG PO TABS: 325.0000 mg | ORAL_TABLET | Freq: Two times a day (BID) | ORAL | Status: DC

## 2012-05-15 MED ORDER — WARFARIN SODIUM 5 MG PO TABS
5.0000 mg | ORAL_TABLET | Freq: Once | ORAL | Status: AC
  Administered 2012-05-15: 5 mg via ORAL
  Filled 2012-05-15: qty 1

## 2012-05-15 MED ORDER — PANTOPRAZOLE SODIUM 40 MG PO TBEC: 40.0000 mg | DELAYED_RELEASE_TABLET | Freq: Two times a day (BID) | ORAL | Status: DC

## 2012-05-15 MED ORDER — LEVALBUTEROL HCL 0.63 MG/3ML IN NEBU: 0.6300 mg | INHALATION_SOLUTION | Freq: Four times a day (QID) | RESPIRATORY_TRACT | Status: DC

## 2012-05-15 MED ORDER — BISOPROLOL FUMARATE 5 MG PO TABS: 5.0000 mg | ORAL_TABLET | Freq: Every day | ORAL | Status: DC

## 2012-05-15 MED ORDER — PREDNISONE 20 MG PO TABS: ORAL_TABLET | ORAL | Status: DC

## 2012-05-15 MED ORDER — GUAIFENESIN 200 MG PO TABS: 200.0000 mg | ORAL_TABLET | Freq: Four times a day (QID) | ORAL | Status: DC | PRN

## 2012-05-15 MED ORDER — IPRATROPIUM BROMIDE 0.02 % IN SOLN: 0.5000 mg | Freq: Four times a day (QID) | RESPIRATORY_TRACT | Status: DC

## 2012-05-15 MED ORDER — GUAIFENESIN 200 MG PO TABS
200.0000 mg | ORAL_TABLET | Freq: Four times a day (QID) | ORAL | Status: DC | PRN
  Filled 2012-05-15: qty 1

## 2012-05-15 NOTE — Progress Notes (Signed)
O2Sats at rest 88%

## 2012-05-15 NOTE — Progress Notes (Signed)
Patient discharged to home, alert and oriented no distress noted, no complaints of any pain or discomfort upon discharge. PIV removed no s/s of infiltration or swelling. Discharge with home O2. D/c instructions and follow up appointment done and given to the daughter.

## 2012-05-15 NOTE — Progress Notes (Signed)
O2 Sats room air at rest 88%

## 2012-05-15 NOTE — Progress Notes (Signed)
Please document sats as follow per Medicare Guidelines   SATURATION QUALIFICATIONS: (This note is used to comply with regulatory documentation for home oxygen)  Patient Saturations on Room Air at Rest = ___%  Patient Saturations on Room Air while Ambulating = ___%  Patient Saturations on ___ Liters of oxygen while Ambulating = ___%  Please briefly explain why patient needs home oxygen:

## 2012-05-15 NOTE — Discharge Summary (Signed)
Physician Discharge Summary  [name]:[id number] DOB: 03/28/1933 DOA: 05/06/2012  PCP: Kristian Covey, MD  Admit date: 05/06/2012 Discharge date: 05/15/2012  Time spent: 35 minutes  Recommendations for Outpatient Follow-up:  1. Needs to follow with pulmonary, needs INR check.   Discharge Diagnoses:   Purulent bronchitis  Atrial fibrillation with RVR  Acute respiratory failure  Dysphagia  HTN (hypertension)  H/O: CVA (cerebrovascular accident)  GERD (gastroesophageal reflux disease)  Acute posthemorrhagic anemia  Gastritis with hemorrhage  Stricture and stenosis of esophagus   Discharge Condition: Stable. Diet recommendation: [place] Healthy  Filed Weights   05/13/12 0500 05/14/12 0523 05/15/12 0500  Weight: 78 kg (171 lb 15.3 oz) 78.7 kg (173 lb 8 oz) 74 kg (163 lb 2.3 oz)    History of present illness:   23 yowm remote smoker maintained on ACEI chronically admitted on 12/4 w/ acute respiratory failure in setting of presumed bronchitis    Hospital Course:  5 yowm remote smoker maintained on ACEI chronically admitted on 12/4 w/ acute respiratory failure in setting of presumed bronchitis.  1) Acute respiratory failure in setting of purulent bronchitis vs CAP superimposed on what looks like chronic interstitial lung disease.  Patient was admitted ICU under CCM service. He was treated with IV antibiotics, nebulizer treatment. His respiratory status has improve. He was initially requiring 6 L of oxygen now only 2 L. He feels he has improved significantly. Continue with Cough medications. Patient will be discharge today if feeling well. -Empirical levaquin x 10 days to complete on 12-14  -Scheduled nebs  -Avoid ace.  -Oral prednisone.   2) afib w/ RVR  - Echo 03/09/12 Left ventricle: The cavity size was normal. Wall thickness was normal. Systolic function was normal. The estimatedejection fraction was in the range of 50% to 55%. Mitral valve: Mild regurgitation.  Left atrium: The atrium was mildly dilated. Right ventricle: The cavity size was mildly dilated. Systolic function was mildly reduced. Right atrium: The atrium was mildly dilated.Pulmonary arteries: PA peak pressure: 34mm Hg  Continue with Bisoprolol. Resume coumadin.  3) GERD & Dysphagia  - SLP eval 12/6 > reg/ thin liquid  4) Hyperglycemia  Aggravated by steroids. Monitor. Titrate steroids.  5) H/o cva & Bells palsy  No acute issues  6)Melena, Acute Blood anemia: HB stable, for possible endoscopy 12-11 show Linear gastric erosions. Large hiatal hernia.esophageal stricture. Per GI ok to resume Anticoagulation. Repeat HB in am stable at 9.9. Start ferrous sulfate. No more melena   Procedures: Endoscopy: 1. Linear gastric erosions                      2. Large hiatal hernia                      3. esophageal stricture.     Consultations:  GI  CCM admitted patient.   Discharge Exam: Filed Vitals:   05/14/12 2129 05/15/12 0500 05/15/12 0508 05/15/12 0838  BP: 115/68  102/56   Pulse: 87     Temp: 98.1 F (36.7 C)  97.6 F (36.4 C)   TempSrc: Oral     Resp: 18  16   Height:      Weight:  74 kg (163 lb 2.3 oz)    SpO2: 100%  94% 88%    General: No distress.  Cardiovascular: S 1, S 2 RRR Respiratory: Bilateral ronchus, no wheezes.  Discharge Instructions  Discharge Orders    Future  Appointments: Provider: Department: Dept Phone: Center:   05/22/2012 12:00 PM Kristian Covey, MD [facility] [place] [phone] [facility]   06/17/2012 12:00 PM Kristian Covey, MD [facility] [place] [phone] [facility]   08/19/2012 1:30 PM Kristian Covey, MD [facility] [place] [phone] [facility]     Future Orders Please Complete By Expires   Diet - low sodium heart healthy      Increase activity slowly          Medication List     As of 05/15/2012 10:49 AM    STOP taking these medications         amLODipine 5 MG tablet    Commonly known as: NORVASC      cyanocobalamin 1000 MCG/ML injection   Commonly known as: (VITAMIN B-12)      enalapril 10 MG tablet   Commonly known as: VASOTEC      PRILOSEC 20 MG capsule   Generic drug: omeprazole      TAKE these medications         AVODART 0.5 MG capsule   Generic drug: dutasteride   Take 0.5 mg by mouth daily.      bisoprolol 5 MG tablet   Commonly known as: ZEBETA   Take 1 tablet (5 mg total) by mouth daily.      doxazosin 4 MG tablet   Commonly known as: CARDURA   Take 4 mg by mouth daily.      ferrous sulfate 325 (65 FE) MG tablet   Take 1 tablet (325 mg total) by mouth 2 (two) times daily with a meal.      gabapentin 600 MG tablet   Commonly known as: NEURONTIN   Take 1 tablet (600 mg total) by mouth 3 (three) times daily.      hydrochlorothiazide 25 MG tablet   Commonly known as: HYDRODIURIL   Take 1/2 tab daily      ipratropium 0.02 % nebulizer solution   Commonly known as: ATROVENT   Take 2.5 mLs (0.5 mg total) by nebulization 4 (four) times daily.      levalbuterol 0.63 MG/3ML nebulizer solution   Commonly known as: XOPENEX   Take 3 mLs (0.63 mg total) by nebulization 4 (four) times daily.      levofloxacin 500 MG tablet   Commonly known as: LEVAQUIN   Take 1 tablet (500 mg total) by mouth daily.      LOTRISONE cream   Generic drug: clotrimazole-betamethasone   APPLY TO AFFECTED AREAS TWICE A DAY      pantoprazole 40 MG tablet   Commonly known as: PROTONIX   Take 1 tablet (40 mg total) by mouth 2 (two) times daily before a meal.      predniSONE 20 MG tablet   Commonly known as: DELTASONE   Take 2 tablet for 3 days then 1 tablet for 3 days then half tablet for one day then stop.      warfarin 5 MG tablet   Commonly known as: COUMADIN   Take 2.5-5 mg by mouth See admin instructions. Pt takes 2.5 mg every day except on Monday pt takes 5 mg      ZOCOR 20 MG tablet   Generic drug: simvastatin   TAKE ONE TABLET AT BEDTIME            Guaifenesin 200 mg po every 6 hour PRN   The results of significant diagnostics from this hospitalization (including imaging, microbiology, ancillary and laboratory) are  listed below for reference.    Significant Diagnostic Studies: Dg Chest Port 1 View  05/09/2012  *RADIOLOGY REPORT*  Clinical Data: Edema, cough.  PORTABLE CHEST - 1 VIEW  Comparison: 05/07/2012  Findings: Prior median sternotomy.  Cardiomegaly.  Left lower lobe airspace opacity again noted.  On today's study, this appears more concerning for consolidation/pneumonia.  Small left effusion.  Bilateral hilar prominence, right greater than left.  Cannot exclude adenopathy.  Left upper lobe atelectasis and right basilar atelectasis. No acute bony abnormality.  IMPRESSION: The left retrocardiac density is concerning for pneumonia.  Small left effusion.  Left upper lobe and right basilar atelectasis.  Bilateral hilar fullness, right greater than left.  Cannot exclude adenopathy.   Original Report Authenticated By: Charlett Nose, M.D.    Dg Chest Port 1 View  05/07/2012  *RADIOLOGY REPORT*  Clinical Data: Evaluate for pneumonia  PORTABLE CHEST - 1 VIEW  Comparison: 05/06/2012; 04/03/2010; 06/26/2007  Findings:  Kyphotic projection.  Grossly unchanged enlarged cardiac silhouette and mediastinal contours with marked enlargement of the central pulmonary vasculature.  Post median sternotomy.  Left basilar/retrocardiac opacity are unchanged and again favored to represent a hiatal hernia.  The lungs appear hyperinflated.  No new focal airspace opacities.  No definite pleural effusion or pneumothorax, though note, the bilateral lung apices are excluded and partially obscured by the overlying chin.  IMPRESSION: 1.  Unchanged left basilar/retrocardiac opacities again favored to represent known large hiatal hernia. No focal airspace opacities to suggest pneumonia. Further evaluation with a PA and lateral chest radiograph may be obtained as  clinically indicated. 2.  Grossly unchanged enlarged cardiac silhouette and prominence of the central pulmonary vasculature, nonspecific but suggestive of pulmonary arterial hypertension.  Further evaluation with cardiac echo may be performed as clinically indicated.  No definite evidence of edema.   Original Report Authenticated By: Tacey Ruiz, MD    Dg Chest Port 1 View  05/06/2012  *RADIOLOGY REPORT*  Clinical Data: Cough and fever.  PORTABLE CHEST - 1 VIEW  Comparison: 04/03/2010.  Findings: The heart is enlarged but stable.  Stable prominent pulmonary hila.  There are chronic bronchitic and fibrotic lung changes.  No definite acute overlying pulmonary process.  The bony thorax is intact.  IMPRESSION: Chronic lung changes without obvious overlying acute pulmonary findings.   Original Report Authenticated By: Rudie Meyer, M.D.     Microbiology: Recent Results (from the past 240 hour(s))  CULTURE, BLOOD (ROUTINE X 2)     Status: Normal   Collection Time   05/06/12 10:25 AM      Component Value Range Status Comment   Specimen Description BLOOD LEFT FOREARM   Final    Special Requests BOTTLES DRAWN AEROBIC AND ANAEROBIC   Final    Culture  Setup Time 05/06/2012 16:52   Final    Culture     Final    Value:        BLOOD CULTURE RECEIVED NO GROWTH TO DATE CULTURE WILL BE HELD FOR 5 DAYS BEFORE ISSUING A FINAL NEGATIVE REPORT   Report Status 05/12/2012 FINAL   Final   CULTURE, BLOOD (ROUTINE X 2)     Status: Normal   Collection Time   05/06/12 10:30 AM      Component Value Range Status Comment   Specimen Description BLOOD RIGHT ANTECUBITAL   Final    Special Requests BOTTLES DRAWN AEROBIC AND ANAEROBIC   Final    Culture  Setup Time 05/06/2012 16:52  Final    Culture     Final    Value:        BLOOD CULTURE RECEIVED NO GROWTH TO DATE CULTURE WILL BE HELD FOR 5 DAYS BEFORE ISSUING A FINAL NEGATIVE REPORT   Report Status 05/12/2012 FINAL   Final   URINE CULTURE     Status: Normal    Collection Time   05/06/12 10:49 AM      Component Value Range Status Comment   Specimen Description URINE, RANDOM   Final    Special Requests NONE   Final    Culture  Setup Time 05/06/2012 18:46   Final    Colony Count NO GROWTH   Final    Culture NO GROWTH   Final    Report Status 05/07/2012 FINAL   Final   MRSA PCR SCREENING     Status: Normal   Collection Time   05/06/12  5:27 PM      Component Value Range Status Comment   MRSA by PCR NEGATIVE  NEGATIVE Final      Labs: Basic Metabolic Panel:  Lab 05/15/12 4098 05/14/12 0445 05/13/12 0519 05/12/12 0515  NA 138 139 139 139  K 3.9 4.2 4.2 4.5  CL 101 100 97 100  CO2 33* 36* 37* 34*  GLUCOSE 102* 126* 104* 134*  BUN 32* 34* 47* 48*  CREATININE 0.83 0.85 0.96 0.86  CALCIUM 8.6 8.6 8.8 9.0  MG -- -- -- --  PHOS -- -- -- --   CBC:  Lab 05/15/12 0453 05/14/12 0445 05/13/12 0519 05/12/12 0515 05/09/12 0610  WBC 9.6 12.3* 12.4* 9.8 11.1*  NEUTROABS -- -- -- -- --  HGB 9.9* 9.9* 11.1* 11.7* 12.6*  HCT 30.1* 29.6* 33.9* 35.5* 38.7*  MCV 90.9 90.5 90.6 90.1 90.6  PLT 222 229 227 210 172   Cardiac Enzymes: No results found for this basename: CKTOTAL:5,CKMB:5,CKMBINDEX:5,TROPONINI:5 in the last 168 hours BNP: BNP (last 3 results) No results found for this basename: PROBNP:3 in the last 8760 hours CBG:  Lab 05/15/12 0743 05/14/12 2112 05/14/12 1702 05/14/12 1144 05/14/12 0717  GLUCAP 127* 137* 182* 138* 109*       Signed:  Ramie [name]  [facility] 05/15/2012, 10:49 AM

## 2012-05-15 NOTE — Progress Notes (Signed)
Off O2 at rest 91%, off O2 post ambulation 93%

## 2012-05-15 NOTE — Progress Notes (Deleted)
O2 Sats on room air 92%.

## 2012-05-15 NOTE — Progress Notes (Signed)
ANTICOAGULATION CONSULT NOTE - Follow Up Consult  Pharmacy Consult for warfarin Indication: atrial fibrillation  Allergies  Allergen Reactions  . Penicillins     hives   Patient Measurements: Height: 5\' 11"  (180.3 cm) Weight: 163 lb 2.3 oz (74 kg) IBW/kg (Calculated) : 75.3   Vital Signs: Temp: 97.6 F (36.4 C) (12/13 0508) Temp src: Oral (12/12 2129) BP: 102/56 mmHg (12/13 0508) Pulse Rate: 87  (12/12 2129)  Labs:  Basename 05/15/12 0453 05/14/12 0445 05/13/12 0519  HGB 9.9* 9.9* --  HCT 30.1* 29.6* 33.9*  PLT 222 229 227  APTT -- -- --  LABPROT 16.1* 19.1* 23.6*  INR 1.32 1.66* 2.21*  HEPARINUNFRC -- -- --  CREATININE 0.83 0.85 0.96  CKTOTAL -- -- --  CKMB -- -- --  TROPONINI -- -- --   Estimated Creatinine Clearance: 78 ml/min (by C-G formula based on Cr of 0.83).  Medications:  Scheduled:     . bisoprolol  5 mg Oral Daily  . doxazosin  4 mg Oral Daily  . dutasteride  0.5 mg Oral Daily  . ferrous sulfate  325 mg Oral BID WC  . gabapentin  600 mg Oral TID  . insulin aspart  0-9 Units Subcutaneous TID WC  . ipratropium  0.5 mg Nebulization QID  . levalbuterol  0.63 mg Nebulization QID  . levofloxacin  500 mg Oral Daily  . pantoprazole  40 mg Oral BID AC  . predniSONE  40 mg Oral Q breakfast  . Warfarin - Pharmacist Dosing Inpatient   Does not apply q1800    Assessment: 45 YOM admitted with cough, SOB, and wheezing. Patient on warfarin 2.5mg  daily except 5mg  mondays for h/o afib and CVA. INR subtherapeutic at admission.  INR subtherapeutic, falling as expected after holding 12/8-12/11.    RN noted black stools 12/10 and maroon stool 12/11.  EGD/enteroscopy 12/11 showed erosive gastritis.  Per GI consult, started BID PPI and "OK to reanticoagulate" on 12/12.  Hgb stabilizing, ferrous sulfate started 12/12.  Potential drug interactions with warfarin (potentiate effect): levofloxacin - plan to stop tomorrow.  Goal of Therapy:  INR 2-3 Monitor  platelets by anticoagulation protocol: Yes   Plan:   Increase warfarin 5mg  PO today x1  Daily INR  Loralee Pacas, PharmD, BCPS Pager: [phone] 05/15/2012 8:00 AM

## 2012-05-16 ENCOUNTER — Telehealth: Payer: Self-pay | Admitting: Family Medicine

## 2012-05-16 NOTE — Telephone Encounter (Signed)
Clydie Braun, RN calling from [facility] with pt/inr results for patient. PT 19.6 and INR 1.6. Recheck on Monday.

## 2012-05-16 NOTE — Telephone Encounter (Signed)
Had been hospitalized with GI bleed and probably just restarted. Don't want it going too high No change and recheck on Monday 12/16  Also with sig orthostatic hypotension per RN Off HTN meds and no diuretics They will recheck him tomorrow  Ask for speech therapy also I told them to request this from Dr Caryl Never on Monday

## 2012-05-18 ENCOUNTER — Ambulatory Visit: Payer: Self-pay | Admitting: Family

## 2012-05-18 ENCOUNTER — Telehealth: Payer: Self-pay | Admitting: Family Medicine

## 2012-05-18 DIAGNOSIS — I4891 Unspecified atrial fibrillation: Secondary | ICD-10-CM

## 2012-05-18 NOTE — Telephone Encounter (Signed)
Does he want [name] to manage his INR? No record that we have been managing him here, although he is a Dr. B patient.

## 2012-05-18 NOTE — Patient Instructions (Signed)
Continue 5mg  on Monday and 2.5mg  all other days.     Latest dosing instructions   Total Sun Mon Tue Wed Thu Fri Sat   20 2.5 mg 5 mg 2.5 mg 2.5 mg 2.5 mg 2.5 mg 2.5 mg    (2.5 mg1) (2.5 mg2) (2.5 mg1) (2.5 mg1) (2.5 mg1) (2.5 mg1) (2.5 mg1)

## 2012-05-18 NOTE — Telephone Encounter (Signed)
PT/INR Results  PT 24.9 INR 2.1 Dosage:  2.5 every day except 5mg  on Monday  However pt unsure of what dosage he should be taking.  Pt has severe swelling in feet 2+in bilateral extremeties, nurse has asked pt and family to start weighing pt everyday.

## 2012-05-19 ENCOUNTER — Other Ambulatory Visit: Payer: Self-pay | Admitting: Family

## 2012-05-19 MED ORDER — FUROSEMIDE 20 MG PO TABS: 20.0000 mg | ORAL_TABLET | Freq: Every day | ORAL | Status: DC

## 2012-05-20 ENCOUNTER — Ambulatory Visit: Payer: BC Managed Care – PPO | Admitting: Family Medicine

## 2012-05-20 NOTE — Telephone Encounter (Signed)
Advised by phone to continue the same dose of coumadin. Recheck in 3 weeks. I will cal in a short supply of lasix until patient sees Dr. Caryl Never on Friday. Advised nurse to call if no better.

## 2012-05-21 ENCOUNTER — Inpatient Hospital Stay: Payer: Medicare Other | Admitting: Internal Medicine

## 2012-05-22 ENCOUNTER — Ambulatory Visit (INDEPENDENT_AMBULATORY_CARE_PROVIDER_SITE_OTHER): Payer: Medicare Other | Admitting: Family Medicine

## 2012-05-22 ENCOUNTER — Ambulatory Visit (INDEPENDENT_AMBULATORY_CARE_PROVIDER_SITE_OTHER): Payer: Medicare Other | Admitting: Internal Medicine

## 2012-05-22 ENCOUNTER — Ambulatory Visit: Payer: Medicare Other | Admitting: Family Medicine

## 2012-05-22 ENCOUNTER — Encounter: Payer: Self-pay | Admitting: Internal Medicine

## 2012-05-22 ENCOUNTER — Encounter: Payer: Self-pay | Admitting: Family Medicine

## 2012-05-22 VITALS — BP 102/60 | HR 90 | Temp 97.8°F | Ht 71.0 in | Wt 165.0 lb

## 2012-05-22 VITALS — BP 102/68 | Temp 97.5°F | Wt 167.0 lb

## 2012-05-22 DIAGNOSIS — R634 Abnormal weight loss: Secondary | ICD-10-CM

## 2012-05-22 DIAGNOSIS — L89302 Pressure ulcer of unspecified buttock, stage 2: Secondary | ICD-10-CM

## 2012-05-22 DIAGNOSIS — J449 Chronic obstructive pulmonary disease, unspecified: Secondary | ICD-10-CM

## 2012-05-22 DIAGNOSIS — I4891 Unspecified atrial fibrillation: Secondary | ICD-10-CM

## 2012-05-22 DIAGNOSIS — R0609 Other forms of dyspnea: Secondary | ICD-10-CM

## 2012-05-22 DIAGNOSIS — L89309 Pressure ulcer of unspecified buttock, unspecified stage: Secondary | ICD-10-CM

## 2012-05-22 DIAGNOSIS — R0600 Dyspnea, unspecified: Secondary | ICD-10-CM

## 2012-05-22 DIAGNOSIS — R630 Anorexia: Secondary | ICD-10-CM

## 2012-05-22 DIAGNOSIS — R5383 Other fatigue: Secondary | ICD-10-CM

## 2012-05-22 DIAGNOSIS — J441 Chronic obstructive pulmonary disease with (acute) exacerbation: Secondary | ICD-10-CM | POA: Insufficient documentation

## 2012-05-22 DIAGNOSIS — J9610 Chronic respiratory failure, unspecified whether with hypoxia or hypercapnia: Secondary | ICD-10-CM

## 2012-05-22 DIAGNOSIS — L8992 Pressure ulcer of unspecified site, stage 2: Secondary | ICD-10-CM

## 2012-05-22 LAB — BASIC METABOLIC PANEL
BUN: 25 mg/dL — ABNORMAL HIGH (ref 6–23)
CO2: 31 mEq/L (ref 19–32)
Calcium: 8.6 mg/dL (ref 8.4–10.5)
GFR: 63.47 mL/min (ref >60.00)
Glucose, Bld: 114 mg/dL — ABNORMAL HIGH (ref 70–99)

## 2012-05-22 MED ORDER — MIRTAZAPINE 15 MG PO TBDP: 15.0000 mg | ORAL_TABLET | Freq: Every day | ORAL | Status: DC

## 2012-05-22 NOTE — Patient Instructions (Addendum)
Continue nebulizer 4 x daily as needed   For cough > mucinex 600 mg  2 every 12 hours and use flutter valve (you can cough into if needed)   Continue pantoprazole 40 mg Take 30- 60 min before your first and last meals of the day   Increase the diuril to 25 mg one daily  Please remember to go to the  x-ray department downstairs for your tests - we will call you with the results when they are available.     See Tammy NP w/in 2 weeks with all your medications, even over the counter meds and all your nebulizer meds and inhalers, separated in two separate bags, the ones you take no matter what vs the ones you stop once you feel better and take only as needed when you feel you need them.   Tammy  will generate for you a new user friendly medication calendar that will put [name] all on the same page re: your medication use.     Without this process, it simply isn't possible to assure that we are providing  your outpatient care  with  the attention to detail we feel you deserve.   If we cannot assure that you're getting that kind of care,  then we cannot manage your problem effectively from this clinic.  Once you have seen Tammy and we are sure that we're all on the same page with your medication use she will arrange follow up with me with pfts

## 2012-05-22 NOTE — Patient Instructions (Signed)
Try nutritional supplement such as boost.   Elevate legs frequently We need to consider support hose if leg edema persists

## 2012-05-22 NOTE — Progress Notes (Signed)
Subjective:     Patient ID: [name], male   DOB: 07/01/1932, 78 y.o.   MRN: [id number]  HPI  37 yowm with chronic and slowly sp smoking cessation in the 1980s  With progressive doe x years. Reports on good day over the last year he can ambulate about 100 ft before onset of significant dyspnea and usually limits his activity because of this.   [name] He presented to the ER on 05/06/2012 in acute distress. He was placed on BIPAP, PCCM asked to admit   Admit date: 05/06/2012  Discharge date: 05/15/2012  Discharge Diagnoses:  Purulent bronchitis/ aecopd ? How much acei effect > acei d/c'd on admit Atrial fibrillation with RVR  Acute respiratory failure  Dysphagia  HTN (hypertension)  H/O: CVA (cerebrovascular accident)  GERD (gastroesophageal reflux disease)  Acute posthemorrhagic anemia  Gastritis with hemorrhage  Stricture and stenosis of esophagus   05/22/2012 f/u ov/[name] cc about 50 % better since  Admit but leveling off in terms of activity tol and  t now on 2lpm, congested cough but no purulent sputum.  Has flutter not using, confused with meds/ changes. [name] helps some on prednisone taper finished on day of ov.    No purulent sputum. New leg swelling x sev days  Sleeping ok without nocturnal  or early am exacerbation  of respiratory  c/o's or need for noct saba. Also denies any obvious fluctuation of symptoms with weather or environmental changes or other aggravating or alleviating factors except as outlined above      Review of Systems     Objective:   Physical Exam    frail elderly wm with congested rattling cough Wt Readings from Last 3 Encounters:  05/22/12 165 lb (74.844 kg)  05/22/12 167 lb (75.751 kg)  05/15/12 163 lb 2.3 oz (74 kg)    HEENT mild turbinate edema.  Oropharynx no thrush or excess pnd or cobblestoning.  No JVD or cervical adenopathy. Mild accessory muscle hypertrophy. Trachea midline, nl thryroid. Chest was hyperinflated by percussion with diminished  breath sounds and moderate increased exp time without wheeze. Hoover sign positive at mid inspiration. Regular rate and rhythm without murmur gallop or rub or increase P2 . Pos 1-2plus edema.  Abd: no hsm, nl excursion. Ext warm without cyanosis or clubbing.   CXR  05/22/2012 :   Ordered but not did not go as instructed   Assessment:          Plan:

## 2012-05-22 NOTE — Progress Notes (Signed)
Subjective:    Patient ID: [name], male    DOB: 01/15/1933, 78 y.o.   MRN: [id number]  HPI  Hospital followup. Patient has multiple chronic medical problems and presented here on 05/06/2012 with dyspnea, cough, fever. He was admitted and discharged on 05/15/2012. Discharge diagnoses included purulent bronchitis, acute respiratory failure, chronic atrial fibrillation, dysphagia, hypertension, history of CVA, GERD, esophageal stricture, and gastritis with hemorrhage. Patient had been on ACE inhibitor prior to admission and this was discontinued. Patient was assessed with purulent bronchitis versus community-acquired pneumonia superimposed on possible chronic interstitial lung disease. Treated with IV antibiotics, nebulizers, and oxygen. Discharged home home O2 2 L per minute. Has finished 10 day course of Levaquin. Also discharged on oral prednisone and has completed course.  Patient had echocardiogram with ejection fraction 50-55%. Remains on Coumadin. He did develop some melena and underwent endoscopy during hospitalization which showed linear gastric erosions, large hiatal hernia and esophageal stricture.  Patient continues to feel poorly. He has very poor appetite and very fatigued. Albumin prior to discharge 2.7. He's had some bilateral leg edema past few days. No orthopnea. Difficult time sleeping. Does take some daytime naps.  Wife relates a couple decubiti buttock region. Very little ambulation.  Past Medical History  Diagnosis Date  . History of upper gastrointestinal hemorrhage   . Cerebrovascular accident   . Gastric polyp   . Dysphagia, unspecified   . Coronary artery disease     nonobstructive by cath 2009  . Permanent atrial fibrillation   . Hypertension   . Esophagitis   . GERD (gastroesophageal reflux disease)   . Erosive gastritis   . FH: colonic polyps   . Gastric ulcer   . Hiatal hernia   . Internal hemorrhoids    Past Surgical History  Procedure Date  .  Cholecystectomy   . Asd repair [id number]  . Inguinal hernia repair 1963  . Partial small bowel resection 2005    ischemic?  [name] Enteroscopy 05/13/2012    Procedure: ENTEROSCOPY;  Surgeon: Louis Meckel, MD;  Location: [facility] ENDOSCOPY;  Service: Endoscopy;  Laterality: N/A;    reports that he quit smoking about 28 years ago. His smoking use included Cigarettes. He has a 20 pack-year smoking history. He has never used smokeless tobacco. He reports that he does not drink alcohol or use illicit drugs. family history is not on file. Allergies  Allergen Reactions  . Penicillins     hives      Review of Systems  Constitutional: Positive for appetite change and fatigue. Negative for fever, chills and unexpected weight change.  Respiratory: Positive for cough and shortness of breath. Negative for wheezing.   Cardiovascular: Positive for leg swelling. Negative for chest pain and palpitations.  Gastrointestinal: Negative for nausea, vomiting, abdominal pain and blood in stool.  Genitourinary: Negative for dysuria.  Skin: Positive for wound.  Neurological: Negative for syncope.  Psychiatric/Behavioral: Negative for confusion.       Objective:   Physical Exam  Constitutional: He appears well-developed and well-nourished.  Neck: Neck supple.  Cardiovascular: Normal rate.        Irregular rhythm  Pulmonary/Chest:       Patient has somewhat diminished breath sounds throughout. No active wheezing.  Abdominal: Soft. There is no tenderness.  Musculoskeletal: He exhibits edema.       1+ pitting edema legs  Neurological: He is alert.  Skin:       Patient has stage II decubitus ulcer right buttock. No  surrounding cellulitis changes          Assessment & Plan:  #1 recent acute respiratory failure possibly superimposed on chronic interstitial lung disease. He has followup with pulmonologist later today. #2 chronic atrial fibrillation. Rate controlled. Recent INR 2.1. Continue Coumadin #3 poor  appetite and weight loss. He is also describing depressed mood and great difficulty sleeping. Trial of Remeron 15 mg one each bedtime and reassess in 3-4 weeks. We made some suggestions for ways to boost nutrition and calorie intake  #4 early decubitus ulcer right buttock. Have home health nurse follow with [name] #5 bilateral leg edema. He has low blood pressure already. Try to avoid overdiuresis. Elevate legs frequently. Consider compression garments. Suspect low albumin at least partly contributing-along with decreased ambulation.

## 2012-05-23 ENCOUNTER — Encounter: Payer: Self-pay | Admitting: Internal Medicine

## 2012-05-23 DIAGNOSIS — J9610 Chronic respiratory failure, unspecified whether with hypoxia or hypercapnia: Secondary | ICD-10-CM | POA: Insufficient documentation

## 2012-05-23 NOTE — Assessment & Plan Note (Addendum)
-   placed on 02 at discharge 05/16/11 2lpm 24/7  Adequate control on present rx, reviewed rx

## 2012-05-23 NOTE — Assessment & Plan Note (Signed)
  When respiratory symptoms begin or become refractory well after a patient reports complete smoking cessation,  Especially when this wasn't the case while they were smoking, a red flag is raised based on the work of Dr Primitivo Gauze which states:  if you quit smoking when your best day FEV1 is still well preserved it is highly unlikely you will progress to severe disease.  That is to say, once the smoking stops,  the symptoms should not suddenly erupt or markedly worsen.  If so, the differential diagnosis should include  obesity/deconditioning,  LPR/Reflux/Aspiration syndromes,  occult CHF, or  especially side effect of medications commonly used in this population like acei, stopped x 3 weeks but possibly still having some residual effect.  He is also thoroughly confused with meds.   To keep things simple, I have asked the patient to first separate medicines that are perceived as maintenance, that is to be taken daily "no matter what", from those medicines that are taken on only on an as-needed basis and I have given the patient examples of both, and then return to see our NP to generate a  detailed  medication calendar which should be followed until the next physician sees the patient and updates it.

## 2012-05-23 NOTE — Assessment & Plan Note (Addendum)
-   Left ventricle: The cavity size was normal. Wall thickness was normal. Systolic function was normal. The estimated ejection fraction was in the range of 50% to 55%. - Mitral valve: Mild regurgitation. - Left atrium: The atrium was mildly dilated. - Right ventricle: The cavity size was mildly dilated. Systolic function was mildly reduced. - Right atrium: The atrium was mildly dilated. - Pulmonary arteries: PA peak pressure: 34mm Hg (S).  May have mild cor pulmonale, main rx is 51 - he is developing mild edema on prednisone being tapered off now so will just double his hyrodiuril dose for now

## 2012-05-25 DIAGNOSIS — I2510 Atherosclerotic heart disease of native coronary artery without angina pectoris: Secondary | ICD-10-CM

## 2012-05-25 DIAGNOSIS — J209 Acute bronchitis, unspecified: Secondary | ICD-10-CM

## 2012-05-25 DIAGNOSIS — I4891 Unspecified atrial fibrillation: Secondary | ICD-10-CM

## 2012-05-25 DIAGNOSIS — K2970 Gastritis, unspecified, without bleeding: Secondary | ICD-10-CM

## 2012-05-25 DIAGNOSIS — K2990 Gastroduodenitis, unspecified, without bleeding: Secondary | ICD-10-CM

## 2012-05-25 NOTE — Progress Notes (Signed)
Quick Note:  Pt wife informed ______ 

## 2012-05-26 ENCOUNTER — Inpatient Hospital Stay: Payer: Medicare Other | Admitting: Internal Medicine

## 2012-05-29 ENCOUNTER — Other Ambulatory Visit: Payer: Self-pay | Admitting: *Deleted

## 2012-06-01 ENCOUNTER — Emergency Department (HOSPITAL_COMMUNITY): Payer: Medicare Other

## 2012-06-01 ENCOUNTER — Telehealth: Payer: Self-pay | Admitting: Family Medicine

## 2012-06-01 ENCOUNTER — Inpatient Hospital Stay (HOSPITAL_COMMUNITY)
Admission: EM | Admit: 2012-06-01 | Discharge: 2012-06-06 | DRG: 292 | Disposition: A | Discharge status: Home Health | Payer: Medicare Other | Attending: Internal Medicine | Admitting: Internal Medicine

## 2012-06-01 ENCOUNTER — Encounter (HOSPITAL_COMMUNITY): Payer: Self-pay | Admitting: *Deleted

## 2012-06-01 ENCOUNTER — Encounter: Payer: Self-pay | Admitting: Family Medicine

## 2012-06-01 ENCOUNTER — Ambulatory Visit (INDEPENDENT_AMBULATORY_CARE_PROVIDER_SITE_OTHER): Payer: Medicare Other | Admitting: Family Medicine

## 2012-06-01 VITALS — BP 104/66 | HR 98 | Temp 99.1°F | Wt 160.0 lb

## 2012-06-01 DIAGNOSIS — Z9181 History of falling: Secondary | ICD-10-CM

## 2012-06-01 DIAGNOSIS — Z87891 Personal history of nicotine dependence: Secondary | ICD-10-CM

## 2012-06-01 DIAGNOSIS — Z79899 Other long term (current) drug therapy: Secondary | ICD-10-CM

## 2012-06-01 DIAGNOSIS — R579 Shock, unspecified: Principal | ICD-10-CM | POA: Diagnosis present

## 2012-06-01 DIAGNOSIS — S2239XA Fracture of one rib, unspecified side, initial encounter for closed fracture: Secondary | ICD-10-CM

## 2012-06-01 DIAGNOSIS — I10 Essential (primary) hypertension: Secondary | ICD-10-CM

## 2012-06-01 DIAGNOSIS — R5381 Other malaise: Secondary | ICD-10-CM

## 2012-06-01 DIAGNOSIS — F3289 Other specified depressive episodes: Secondary | ICD-10-CM | POA: Diagnosis present

## 2012-06-01 DIAGNOSIS — E876 Hypokalemia: Secondary | ICD-10-CM

## 2012-06-01 DIAGNOSIS — D649 Anemia, unspecified: Secondary | ICD-10-CM

## 2012-06-01 DIAGNOSIS — K219 Gastro-esophageal reflux disease without esophagitis: Secondary | ICD-10-CM

## 2012-06-01 DIAGNOSIS — I4891 Unspecified atrial fibrillation: Secondary | ICD-10-CM

## 2012-06-01 DIAGNOSIS — K2971 Gastritis, unspecified, with bleeding: Secondary | ICD-10-CM

## 2012-06-01 DIAGNOSIS — N400 Enlarged prostate without lower urinary tract symptoms: Secondary | ICD-10-CM | POA: Diagnosis present

## 2012-06-01 DIAGNOSIS — J4489 Other specified chronic obstructive pulmonary disease: Secondary | ICD-10-CM | POA: Diagnosis present

## 2012-06-01 DIAGNOSIS — S2249XA Multiple fractures of ribs, unspecified side, initial encounter for closed fracture: Secondary | ICD-10-CM | POA: Diagnosis present

## 2012-06-01 DIAGNOSIS — G4700 Insomnia, unspecified: Secondary | ICD-10-CM | POA: Diagnosis present

## 2012-06-01 DIAGNOSIS — Z87898 Personal history of other specified conditions: Secondary | ICD-10-CM

## 2012-06-01 DIAGNOSIS — R296 Repeated falls: Secondary | ICD-10-CM

## 2012-06-01 DIAGNOSIS — W1809XA Striking against other object with subsequent fall, initial encounter: Secondary | ICD-10-CM | POA: Diagnosis present

## 2012-06-01 DIAGNOSIS — Z7901 Long term (current) use of anticoagulants: Secondary | ICD-10-CM

## 2012-06-01 DIAGNOSIS — R609 Edema, unspecified: Secondary | ICD-10-CM | POA: Diagnosis present

## 2012-06-01 DIAGNOSIS — R791 Abnormal coagulation profile: Secondary | ICD-10-CM

## 2012-06-01 DIAGNOSIS — I2510 Atherosclerotic heart disease of native coronary artery without angina pectoris: Secondary | ICD-10-CM | POA: Diagnosis present

## 2012-06-01 DIAGNOSIS — I959 Hypotension, unspecified: Secondary | ICD-10-CM

## 2012-06-01 DIAGNOSIS — R531 Weakness: Secondary | ICD-10-CM

## 2012-06-01 DIAGNOSIS — E860 Dehydration: Secondary | ICD-10-CM

## 2012-06-01 DIAGNOSIS — F329 Major depressive disorder, single episode, unspecified: Secondary | ICD-10-CM | POA: Diagnosis present

## 2012-06-01 DIAGNOSIS — K591 Functional diarrhea: Secondary | ICD-10-CM | POA: Diagnosis present

## 2012-06-01 DIAGNOSIS — J449 Chronic obstructive pulmonary disease, unspecified: Secondary | ICD-10-CM

## 2012-06-01 LAB — LIPASE, BLOOD: Lipase: 30 U/L (ref 11–59)

## 2012-06-01 LAB — COMPREHENSIVE METABOLIC PANEL
AST: 29 U/L (ref 0–37)
Albumin: 2.2 g/dL — ABNORMAL LOW (ref 3.5–5.2)
Chloride: 96 mEq/L (ref 96–112)
Creatinine, Ser: 1.33 mg/dL (ref 0.50–1.35)
Total Bilirubin: 0.5 mg/dL (ref 0.3–1.2)
Total Protein: 5.9 g/dL — ABNORMAL LOW (ref 6.0–8.3)

## 2012-06-01 LAB — PROTIME-INR
INR: 8.66 (ref 0.00–1.49)
Prothrombin Time: 64.8 seconds — ABNORMAL HIGH (ref 11.6–15.2)

## 2012-06-01 LAB — CBC WITH DIFFERENTIAL/PLATELET
Basophils Absolute: 0 10*3/uL (ref 0.0–0.1)
Basophils Relative: 0 % (ref 0–1)
Eosinophils Absolute: 0.2 10*3/uL (ref 0.0–0.7)
HCT: 31.9 % — ABNORMAL LOW (ref 39.0–52.0)
MCH: 28.2 pg (ref 26.0–34.0)
MCHC: 32.3 g/dL (ref 30.0–36.0)
Monocytes Absolute: 0.5 10*3/uL (ref 0.1–1.0)
Neutro Abs: 5.1 10*3/uL (ref 1.7–7.7)
Neutrophils Relative %: 80 % — ABNORMAL HIGH (ref 43–77)
RDW: 14 % (ref 11.5–15.5)

## 2012-06-01 LAB — URINALYSIS, ROUTINE W REFLEX MICROSCOPIC
Glucose, UA: NEGATIVE mg/dL
Hgb urine dipstick: NEGATIVE
Protein, ur: NEGATIVE mg/dL
pH: 5 (ref 5.0–8.0)

## 2012-06-01 LAB — OCCULT BLOOD, POC DEVICE: Fecal Occult Bld: POSITIVE — AB

## 2012-06-01 LAB — TROPONIN I: Troponin I: <0.3 ng/mL (ref <0.30)

## 2012-06-01 LAB — LACTIC ACID, PLASMA: Lactic Acid, Venous: 1.3 mmol/L (ref 0.5–2.2)

## 2012-06-01 MED ORDER — SODIUM CHLORIDE 0.9 % IV BOLUS (SEPSIS): 500.0000 mL | Freq: Four times a day (QID) | INTRAVENOUS | Status: DC | PRN

## 2012-06-01 MED ORDER — ALBUTEROL SULFATE (5 MG/ML) 0.5% IN NEBU
2.5000 mg | INHALATION_SOLUTION | Freq: Four times a day (QID) | RESPIRATORY_TRACT | Status: DC
  Administered 2012-06-02 – 2012-06-04 (×10): 2.5 mg via RESPIRATORY_TRACT
  Filled 2012-06-01 (×10): qty 0.5

## 2012-06-01 MED ORDER — IPRATROPIUM BROMIDE 0.02 % IN SOLN
0.5000 mg | Freq: Four times a day (QID) | RESPIRATORY_TRACT | Status: DC
  Administered 2012-06-01 – 2012-06-04 (×10): 0.5 mg via RESPIRATORY_TRACT
  Filled 2012-06-01 (×10): qty 2.5

## 2012-06-01 MED ORDER — IOHEXOL 300 MG/ML  SOLN
100.0000 mL | Freq: Once | INTRAMUSCULAR | Status: AC | PRN
  Administered 2012-06-01: 100 mL via INTRAVENOUS

## 2012-06-01 MED ORDER — ZOLPIDEM TARTRATE 5 MG PO TABS: 5.0000 mg | ORAL_TABLET | Freq: Every evening | ORAL | Status: DC | PRN

## 2012-06-01 MED ORDER — ACETAMINOPHEN 325 MG PO TABS: 650.0000 mg | ORAL_TABLET | Freq: Four times a day (QID) | ORAL | Status: DC | PRN

## 2012-06-01 MED ORDER — DEXTROSE 5 % IV SOLN
10.0000 mg | INTRAVENOUS | Status: AC
  Administered 2012-06-01: 10 mg via INTRAVENOUS
  Filled 2012-06-01: qty 1

## 2012-06-01 MED ORDER — SODIUM CHLORIDE 0.9 % IV SOLN
INTRAVENOUS | Status: DC
  Administered 2012-06-02: 75 mL/h via INTRAVENOUS
  Administered 2012-06-03 – 2012-06-05 (×3): via INTRAVENOUS

## 2012-06-01 MED ORDER — AZTREONAM 1 G IJ SOLR
2.0000 g | Freq: Three times a day (TID) | INTRAMUSCULAR | Status: DC
  Administered 2012-06-01: 2 g via INTRAVENOUS
  Filled 2012-06-01 (×2): qty 2

## 2012-06-01 MED ORDER — MORPHINE SULFATE 2 MG/ML IJ SOLN
2.0000 mg | Freq: Once | INTRAMUSCULAR | Status: AC
  Administered 2012-06-01: 2 mg via INTRAVENOUS
  Filled 2012-06-01: qty 1

## 2012-06-01 MED ORDER — LEVOFLOXACIN IN D5W 750 MG/150ML IV SOLN
750.0000 mg | INTRAVENOUS | Status: DC
  Administered 2012-06-01: 750 mg via INTRAVENOUS
  Filled 2012-06-01: qty 150

## 2012-06-01 MED ORDER — ACETAMINOPHEN 650 MG RE SUPP: 650.0000 mg | Freq: Four times a day (QID) | RECTAL | Status: DC | PRN

## 2012-06-01 MED ORDER — POTASSIUM CHLORIDE 10 MEQ/100ML IV SOLN
10.0000 meq | INTRAVENOUS | Status: AC
  Administered 2012-06-01 – 2012-06-02 (×5): 10 meq via INTRAVENOUS
  Filled 2012-06-01 (×3): qty 100

## 2012-06-01 MED ORDER — ONDANSETRON HCL 4 MG/2ML IJ SOLN: 4.0000 mg | Freq: Four times a day (QID) | INTRAMUSCULAR | Status: DC | PRN

## 2012-06-01 MED ORDER — DUTASTERIDE 0.5 MG PO CAPS
0.5000 mg | ORAL_CAPSULE | Freq: Every day | ORAL | Status: DC
  Administered 2012-06-02 – 2012-06-06 (×5): 0.5 mg via ORAL
  Filled 2012-06-01 (×5): qty 1

## 2012-06-01 MED ORDER — BISOPROLOL FUMARATE 5 MG PO TABS
5.0000 mg | ORAL_TABLET | Freq: Every day | ORAL | Status: DC
  Administered 2012-06-02 – 2012-06-04 (×3): 5 mg via ORAL
  Filled 2012-06-01 (×3): qty 1

## 2012-06-01 MED ORDER — VANCOMYCIN HCL 1000 MG IV SOLR
750.0000 mg | Freq: Two times a day (BID) | INTRAVENOUS | Status: DC
  Filled 2012-06-01 (×2): qty 750

## 2012-06-01 MED ORDER — LEVOFLOXACIN IN D5W 750 MG/150ML IV SOLN
750.0000 mg | INTRAVENOUS | Status: DC
  Administered 2012-06-02: 750 mg via INTRAVENOUS
  Filled 2012-06-01: qty 150

## 2012-06-01 MED ORDER — MORPHINE SULFATE 2 MG/ML IJ SOLN: 2.0000 mg | INTRAMUSCULAR | Status: DC | PRN

## 2012-06-01 MED ORDER — ONDANSETRON HCL 4 MG PO TABS: 4.0000 mg | ORAL_TABLET | Freq: Four times a day (QID) | ORAL | Status: DC | PRN

## 2012-06-01 MED ORDER — HYDROCODONE-ACETAMINOPHEN 5-325 MG PO TABS: 1.0000 | ORAL_TABLET | ORAL | Status: DC | PRN

## 2012-06-01 MED ORDER — SODIUM CHLORIDE 0.9 % IV BOLUS (SEPSIS)
1000.0000 mL | Freq: Once | INTRAVENOUS | Status: AC
  Administered 2012-06-01: 1000 mL via INTRAVENOUS

## 2012-06-01 MED ORDER — SODIUM CHLORIDE 0.9 % IJ SOLN
3.0000 mL | Freq: Two times a day (BID) | INTRAMUSCULAR | Status: DC
  Administered 2012-06-02 – 2012-06-06 (×4): 3 mL via INTRAVENOUS

## 2012-06-01 NOTE — Progress Notes (Addendum)
ANTIBIOTIC CONSULT NOTE - INITIAL  Pharmacy Consult for Vancomycin/Azactam/Levaquin Indication: rule out pneumonia  Allergies  Allergen Reactions  . Penicillins     hives   Patient Measurements:   Total Body Weight: 72.6 kg  Vital Signs: Temp: 98.1 F (36.7 C) (12/30 1531) Temp src: Oral (12/30 1531) BP: 98/58 mmHg (12/30 1717) Pulse Rate: 88  (12/30 1717) Intake/Output from previous day:   Intake/Output from this shift:    Labs:  Basename 06/01/12 1550  WBC 6.4  HGB 10.3*  PLT 185  LABCREA --  CREATININE 1.33   The CrCl is unknown because both a height and weight (above a minimum accepted value) are required for this calculation. No results found for this basename: VANCOTROUGH:2,VANCOPEAK:2,VANCORANDOM:2,GENTTROUGH:2,GENTPEAK:2,GENTRANDOM:2,TOBRATROUGH:2,TOBRAPEAK:2,TOBRARND:2,AMIKACINPEAK:2,AMIKACINTROU:2,AMIKACIN:2, in the last 72 hours   Microbiology: Recent Results (from the past 720 hour(s))  CULTURE, BLOOD (ROUTINE X 2)     Status: Normal   Collection Time   05/06/12 10:25 AM      Component Value Range Status Comment   Specimen Description BLOOD LEFT FOREARM   Final    Special Requests BOTTLES DRAWN AEROBIC AND ANAEROBIC   Final    Culture  Setup Time 05/06/2012 16:52   Final    Culture     Final    Value:        BLOOD CULTURE RECEIVED NO GROWTH TO DATE CULTURE WILL BE HELD FOR 5 DAYS BEFORE ISSUING A FINAL NEGATIVE REPORT   Report Status 05/12/2012 FINAL   Final   CULTURE, BLOOD (ROUTINE X 2)     Status: Normal   Collection Time   05/06/12 10:30 AM      Component Value Range Status Comment   Specimen Description BLOOD RIGHT ANTECUBITAL   Final    Special Requests BOTTLES DRAWN AEROBIC AND ANAEROBIC   Final    Culture  Setup Time 05/06/2012 16:52   Final    Culture     Final    Value:        BLOOD CULTURE RECEIVED NO GROWTH TO DATE CULTURE WILL BE HELD FOR 5 DAYS BEFORE ISSUING A FINAL NEGATIVE REPORT   Report Status 05/12/2012 FINAL   Final     URINE CULTURE     Status: Normal   Collection Time   05/06/12 10:49 AM      Component Value Range Status Comment   Specimen Description URINE, RANDOM   Final    Special Requests NONE   Final    Culture  Setup Time 05/06/2012 18:46   Final    Colony Count NO GROWTH   Final    Culture NO GROWTH   Final    Report Status 05/07/2012 FINAL   Final   MRSA PCR SCREENING     Status: Normal   Collection Time   05/06/12  5:27 PM      Component Value Range Status Comment   MRSA by PCR NEGATIVE  NEGATIVE Final     Medical History: Past Medical History  Diagnosis Date  . History of upper gastrointestinal hemorrhage   . Cerebrovascular accident   . Gastric polyp   . Dysphagia, unspecified   . Coronary artery disease     nonobstructive by cath 2009  . Permanent atrial fibrillation   . Hypertension   . Esophagitis   . GERD (gastroesophageal reflux disease)   . Erosive gastritis   . FH: colonic polyps   . Gastric ulcer   . Hiatal hernia   . Internal hemorrhoids  Medications:  Anti-infectives     Start     Dose/Rate Route Frequency Ordered Stop   06/01/12 2000   vancomycin (VANCOCIN) 750 mg in sodium chloride 0.9 % 150 mL IVPB        750 mg 150 mL/hr over 60 Minutes Intravenous Every 12 hours 06/01/12 1907     06/01/12 1900   aztreonam (AZACTAM) 2 g in dextrose 5 % 50 mL IVPB        2 g 100 mL/hr over 30 Minutes Intravenous 3 times per day 06/01/12 1807 06/09/12 2159   06/01/12 1815   levofloxacin (LEVAQUIN) IVPB 750 mg        750 mg 100 mL/hr over 90 Minutes Intravenous Every 24 hours 06/01/12 1807 06/04/12 1814         Assessment: 79 yoM evaluated after fall, no rib fractures. Hx of interstitial lung disease, Afib.  Shortness of breath, Chest CT results pending, CXray with mass.  Chronic Coumadin for Afib, supratherapeutic INR, for 10mg  Vit K IV.  Rule out PNA, begin Vancomycin per pharmacy + Azactam/Levaquin.   SCr increasing, clearance < 50 ml/min.  Goal of  Therapy:  Vancomycin trough level 15-20 mcg/ml  Plan:  Vancomycin 750mg  q12 x 8 days Continue Azactam 2gm q8h x 8 days Continue Levaquin 750mg  q24 hr x 3 doses, may need to adjust if SCr continues to rise.  Otho Bellows PharmD Pager [phone] 06/01/2012,7:03 PM

## 2012-06-01 NOTE — H&P (Signed)
[facility]                                                                                    Patient Demographics  [name], is a 78 y.o. male  CSN: [id number]  MRN: [id number]  DOB - 08-23-1932  Admit Date - 06/01/2012  Outpatient Primary MD for the patient is Kristian Covey, MD   With History of -  Past Medical History  Diagnosis Date  . History of upper gastrointestinal hemorrhage   . Cerebrovascular accident   . Gastric polyp   . Dysphagia, unspecified   . Coronary artery disease     nonobstructive by cath 2009  . Permanent atrial fibrillation   . Hypertension   . Esophagitis   . GERD (gastroesophageal reflux disease)   . Erosive gastritis   . FH: colonic polyps   . Gastric ulcer   . Hiatal hernia   . Internal hemorrhoids       Past Surgical History  Procedure Date  . Cholecystectomy   . Asd repair [id number]  . Inguinal hernia repair 1963  . Partial small bowel resection 2005    ischemic?  [name] Enteroscopy 05/13/2012    Procedure: ENTEROSCOPY;  Surgeon: Louis Meckel, MD;  Location: [facility] END[facility];  Service: Endoscopy;  Laterality: N/A;    in for   Chief Complaint  Patient presents with  . Weakness  . Hypotension     HPI  [name]  is a 78 y.o. male, past medical history significant for atrial fibrillation on Coumadin, with history of gastric ulcers partial small bowel resection in the past presenting today with a few days history of weakness and a fall, with right-sided chest pain. Patient reports black tarry stools but he is on iron. No history of bloody stools, abdominal or epigastric pain, nausea vomiting or recent diarrhea. In the emergency room he was hypotensive, slightly confused. Denies any fevers chills, loss of consciousness.    Review of Systems    In addition to the HPI above,  No Fever-chills, No Headache, No changes with Vision or hearing, No problems swallowing food or Liquids, Lower sided chest pain, no Cough  or Shortness of Breath, No Abdominal pain, No Nausea or Vommitting, Bowel movements are regular, No Blood in stool or Urine, No dysuria, No new skin rashes or bruises, No new joints pains-aches,  No new weakness, tingling, numbness in any extremity, No recent weight gain or loss, No polyuria, polydypsia or polyphagia, No significant Mental Stressors.  A full 10 point Review of Systems was done, except as stated above, all other Review of Systems were negative.   Social History History  Substance Use Topics  . Smoking status: Former Smoker -- 1.0 packs/day for 20 years    Types: Cigarettes    Quit date: 06/13/1983  . Smokeless tobacco: Never Used  . Alcohol Use: No    Family History History reviewed. No pertinent family history.   Prior to Admission medications   Medication Sig Start Date End Date Taking? Authorizing Provider  dutasteride (AVODART) 0.5 MG capsule Take 0.5 mg by mouth daily.   Yes Historical Provider, MD  ferrous sulfate 325 (  65 FE) MG tablet Take 1 tablet (325 mg total) by mouth 2 (two) times daily with a meal. 05/15/12  Yes Belkys A Regalado, MD  gabapentin (NEURONTIN) 600 MG tablet Take 1 tablet (600 mg total) by mouth 3 (three) times daily. 08/15/11 08/14/12 Yes Kristian Covey, MD  guaiFENesin (MUCINEX) 600 MG 12 hr tablet Take 600 mg by mouth 2 (two) times daily.   Yes Historical Provider, MD  hydrochlorothiazide (HYDRODIURIL) 25 MG tablet Take 25 mg by mouth daily.   Yes Historical Provider, MD  ipratropium (ATROVENT) 0.02 % nebulizer solution Take 2.5 mLs (0.5 mg total) by nebulization 4 (four) times daily. 05/15/12  Yes Belkys A Regalado, MD  levalbuterol (XOPENEX) 0.63 MG/3ML nebulizer solution Take 3 mLs (0.63 mg total) by nebulization 4 (four) times daily. 05/15/12  Yes Belkys A Regalado, MD  simvastatin (ZOCOR) 20 MG tablet Take 20 mg by mouth at bedtime.   Yes Historical Provider, MD  warfarin (COUMADIN) 5 MG tablet Take 2.5-5 mg by mouth daily. Pt  takes 2.5 mg every day except on Monday pt takes 5 mg   Yes Historical Provider, MD  bisoprolol (ZEBETA) 5 MG tablet Take 1 tablet (5 mg total) by mouth daily. 05/15/12   Belkys A Regalado, MD  doxazosin (CARDURA) 4 MG tablet Take 4 mg by mouth daily.  01/09/12   Historical Provider, MD    Allergies  Allergen Reactions  . Penicillins     hives    Physical Exam  Vitals  Blood pressure 98/52, pulse 89, temperature 98.4 F (36.9 C), temperature source Oral, resp. rate 19, SpO2 99.00%.   1. white American male lying in bed, looks tired  2. Normal affect and insight, Not Suicidal or Homicidal, mildly confused  3. No F.N deficits, ALL C.Nerves Intact, Strength 5/5 all 4 extremities, Sensation intact all 4 extremities, Plantars down going.  4. Ears and Eyes appear Normal, Conjunctivae clear, PERRLA. Moist Oral Mucosa.  5. Supple Neck, No JVD, No cervical lymphadenopathy appriciated, No Carotid Bruits.  6. Symmetrical Chest wall movement, with poor air movement with decreased breath sounds at the bases  7. irregularity regular, No Gallops, Rubs or Murmurs, No Parasternal Heave.  8. Positive Bowel Sounds, Abdomen Soft, Non tender, No organomegaly appriciated,No rebound -guarding or rigidity.  9.  No Cyanosis, Normal Skin Turgor, No Skin Rash or Bruise.  10. Good muscle tone,  joints appear normal , no effusions, Normal ROM.  11. No Palpable Lymph Nodes in Neck or Axillae    Data Review  CBC  Lab 06/01/12 1550  WBC 6.4  HGB 10.3*  HCT 31.9*  PLT 185  MCV 87.4  MCH 28.2  MCHC 32.3  RDW 14.0  LYMPHSABS 0.6*  MONOABS 0.5  EOSABS 0.2  BASOSABS 0.0  BANDABS --   ------------------------------------------------------------------------------------------------------------------  Chemistries   Lab 06/01/12 1550  NA 138  K 2.7*  CL 96  CO2 33*  GLUCOSE 105*  BUN 28*  CREATININE 1.33  CALCIUM 7.7*  MG --  AST 29  ALT 29  ALKPHOS 61  BILITOT 0.5    ------------------------------------------------------------------------------------------------------------------ CrCl is unknown because both a height and weight (above a minimum accepted value) are required for this calculation. ------------------------------------------------------------------------------------------------------------------ No results found for this basename: TSH,T4TOTAL,FREET3,T3FREE,THYROIDAB in the last 72 hours   Coagulation profile  Lab 06/01/12 1550  INR 8.66*  PROTIME --   ------------------------------------------------------------------------------------------------------------------- No results found for this basename: DDIMER:2 in the last 72 hours -------------------------------------------------------------------------------------------------------------------  Cardiac Enzymes  Lab 06/01/12  1550  CKMB --  TROPONINI <0.30  MYOGLOBIN --   ------------------------------------------------------------------------------------------------------------------ No components found with this basename: POCBNP:3   ---------------------------------------------------------------------------------------------------------------  Urinalysis    Component Value Date/Time   COLORURINE ORANGE* 05/06/2012 1049   APPEARANCEUR CLEAR 05/06/2012 1049   LABSPEC 1.027 05/06/2012 1049   PHURINE 5.5 05/06/2012 1049   GLUCOSEU NEGATIVE 05/06/2012 1049   HGBUR SMALL* 05/06/2012 1049   BILIRUBINUR MODERATE* 05/06/2012 1049   KETONESUR 40* 05/06/2012 1049   PROTEINUR 100* 05/06/2012 1049   UROBILINOGEN 1.0 05/06/2012 1049   NITRITE POSITIVE* 05/06/2012 1049   LEUKOCYTESUR TRACE* 05/06/2012 1049    ----------------------------------------------------------------------------------------------------------------  ABG   Imaging results:   Dg Ribs Unilateral W/chest Right  06/01/2012  *RADIOLOGY REPORT*  Clinical Data: Weakness and hypotension.  Right-sided pain. Shortness of  breath.  RIGHT RIBS AND CHEST - 3+ VIEW  Comparison: Chest x-ray 05/09/2012.  Findings: Multiple dedicated views of the right ribs demonstrate no definite acute displaced rib fractures.  The lung volumes remain low.  There continues to be extensive left sided retrocardiac opacity concerning for airspace consolidation.  Small - moderate left pleural effusion is similar to the prior examination. Bilateral hilar fullness is again noted, and is somewhat masslike, particularly on the right.  Mild pulmonary venous congestion without frank pulmonary edema.  Mild cardiomegaly is unchanged. Atherosclerosis in the thoracic aorta.  Status post median sternotomy.  IMPRESSION: 1.  No definite acute displaced right-sided rib fractures. 2.  Persistent left lower lobe consolidation concerning for pneumonia with small - moderate left parapneumonic pleural effusion. 3.  Persistent mass-like enlargement of the hilar regions bilaterally, particularly on the right.  Underlying malignancy or lymphadenopathy is not excluded.  Further evaluation with contrast enhanced CT of the thorax is recommended to better characterize these findings and to better evaluate the process in the left lower lobe.  The given the patient's symptoms, a PE protocol CT scan would be optimal, as this would also be able to exclude underlying pulmonary embolism.   Original Report Authenticated By: Trudie Reed, M.D.    Ct Head Wo Contrast  06/01/2012  *RADIOLOGY REPORT*  Clinical Data: 78 year old male fall.  Super therapeutic INR.  CT HEAD WITHOUT CONTRAST  Technique:  Contiguous axial images were obtained from the base of the skull through the vertex without contrast.  Comparison: [facility] CT 11/04/2008.  Findings: Trace fluid level in the left sphenoid sinus appears to be inflammatory.  Other Visualized paranasal sinuses and mastoids are clear.  Intermittent mild motion artifact.  Postoperative changes to the orbits, stable.  No  scalp hematoma. No acute osseous abnormality identified.  Mild Calcified atherosclerosis at the skull base.  Stable cerebral volume.  No ventriculomegaly. No midline shift, mass effect, or evidence of mass lesion.  No acute intracranial hemorrhage identified.  Patchy confluent white matter hypodensity. No evidence of cortically based acute infarction identified.  No suspicious intracranial vascular hyperdensity.  IMPRESSION: Stable volume loss and nonspecific white matter changes. No acute intracranial abnormality.   Original Report Authenticated By: Erskine Speed, M.D.    Ct Chest W Contrast  06/01/2012  *RADIOLOGY REPORT*  Clinical Data: Follow up abnormal chest x-ray  CT CHEST WITH CONTRAST  Technique:  Multidetector CT imaging of the chest was performed following the standard protocol during bolus administration of intravenous contrast.  Contrast: OMNIPAQUE IOHEXOL 300 MG/ML  SOLN  Comparison: Bilateral rib radiographs 06/01/2012; prior chest x-ray 05/09/2012  Findings:  Mediastinum: Unremarkable CT appearance of the thyroid  gland.  No suspicious mediastinal or hilar adenopathy.  Large sliding hiatal hernia with intrathoracic stomach.  Only the distal antrum lies below the diaphragm.  Heart/Vascular: Marked enlargement of the main and central pulmonary arteries consistent with underlying pulmonary arterial hypertension.  No filling defect to suggest pulmonary embolus.  The heart is enlarged with left greater than right by atrial enlargement.  Atherosclerotic vascular calcifications noted in the coronary arteries.  Status post median sternotomy with evidence of prior right internal mammary artery bypass.  No pericardial effusion.  Scattered atherosclerotic calcifications throughout the thoracic aorta without aneurysmal dilatation.  Four vessel arch. Left vertebral artery arises directly from the aorta.  Lungs/Pleura: Advance centrilobular emphysema. Bibasilar and nodular consolidation versus pleural  parenchymal scarring. Additionally, there is peribronchovascular macro nodularity in the left upper lobe peripherally.  Upper Abdomen: Intrathoracic stomach as above.  Surgical changes of prior cholecystectomy.  Otherwise, visualized upper abdominal contents unremarkable.  Bones: Acute nondisplaced fractures of the anterolateral aspect of right ribs six and seven.  IMPRESSION:  1. Acute nondisplaced fractures of the anterolateral aspect of right ribs six and seven 2.  Peribronchovascular macro nodularity in the periphery of the left upper lobe consistent with an underlying infectious/inflammatory process.  3.  Nodular consolidation versus pleural parenchymal scarring with associated volume loss in the bilateral lower lobes.  These findings are favored to reflect the sequela of chronic aspiration given the patient's large hiatal hernia.  Recommend follow-up chest CT in 3 months to assess stability.  4.  Massive pulmonary arterial enlargement and likely associated bi atrial enlargement is highly suggestive of pulmonary arterial hypertension.  This accounts for the appearance of a masslike hilar enlargement on the recent chest radiographs.  5.  Advanced centrilobular emphysema.  6.  Atherosclerosis including coronary artery disease.  7. Large sliding hiatal hernia with near total intrathoracic stomach.   Original Report Authenticated By: Malachy Moan, M.D.    Dg Chest Port 1 View  05/09/2012  *RADIOLOGY REPORT*  Clinical Data: Edema, cough.  PORTABLE CHEST - 1 VIEW  Comparison: 05/07/2012  Findings: Prior median sternotomy.  Cardiomegaly.  Left lower lobe airspace opacity again noted.  On today's study, this appears more concerning for consolidation/pneumonia.  Small left effusion.  Bilateral hilar prominence, right greater than left.  Cannot exclude adenopathy.  Left upper lobe atelectasis and right basilar atelectasis. No acute bony abnormality.  IMPRESSION: The left retrocardiac density is concerning for  pneumonia.  Small left effusion.  Left upper lobe and right basilar atelectasis.  Bilateral hilar fullness, right greater than left.  Cannot exclude adenopathy.   Original Report Authenticated By: Charlett Nose, M.D.    Dg Chest Port 1 View  05/07/2012  *RADIOLOGY REPORT*  Clinical Data: Evaluate for pneumonia  PORTABLE CHEST - 1 VIEW  Comparison: 05/06/2012; 04/03/2010; 06/26/2007  Findings:  Kyphotic projection.  Grossly unchanged enlarged cardiac silhouette and mediastinal contours with marked enlargement of the central pulmonary vasculature.  Post median sternotomy.  Left basilar/retrocardiac opacity are unchanged and again favored to represent a hiatal hernia.  The lungs appear hyperinflated.  No new focal airspace opacities.  No definite pleural effusion or pneumothorax, though note, the bilateral lung apices are excluded and partially obscured by the overlying chin.  IMPRESSION: 1.  Unchanged left basilar/retrocardiac opacities again favored to represent known large hiatal hernia. No focal airspace opacities to suggest pneumonia. Further evaluation with a PA and lateral chest radiograph may be obtained as clinically indicated. 2.  Grossly unchanged enlarged cardiac silhouette  and prominence of the central pulmonary vasculature, nonspecific but suggestive of pulmonary arterial hypertension.  Further evaluation with cardiac echo may be performed as clinically indicated.  No definite evidence of edema.   Original Report Authenticated By: Tacey Ruiz, MD    Dg Chest Port 1 View  05/06/2012  *RADIOLOGY REPORT*  Clinical Data: Cough and fever.  PORTABLE CHEST - 1 VIEW  Comparison: 04/03/2010.  Findings: The heart is enlarged but stable.  Stable prominent pulmonary hila.  There are chronic bronchitic and fibrotic lung changes.  No definite acute overlying pulmonary process.  The bony thorax is intact.  IMPRESSION: Chronic lung changes without obvious overlying acute pulmonary findings.   Original Report  Authenticated By: Rudie Meyer, M.D.     My personal review of EKG: Atrial fibrillation with right bundle branch block    Assessment & Plan  1. shock unknown type probably hemorrhagic with elevation of INR. rule out cardiogenic/septic  2. Coagulopathy/iatrogenic 3. Atrial fibrillation controlled rate. 4. Pulmonary hypertension 5. Right-sided rib fractures 6. Hypokalemia 7. Severe anemia  Plan Patient already received vitamin K in the emergency room with fresh frozen plasma Potassium being replaced IV fluids due to hypotension. If no response dopamine will be started Check echocardiogram and serial troponins Pain control Transfuse when necessary Broad-spectrum antibiotics Check cultures. Will place patient in a monitored bed in step down. check stool guaiac   follow hemoglobin and hematocrit   DVT Prophylaxis SCDs  AM Labs Ordered, also please review Full Orders  Family Communication: Admission, patients condition and plan of care including tests being ordered have been discussed with the patient who indicates understanding and agree with the plan and Code Status.  Code Status  full   Disposition Plan: Admit to step down then discharge home when stable  Time spent in minutes : 55  Condition critical

## 2012-06-01 NOTE — ED Notes (Signed)
Pt reports he's been feeling sick and weak x 1 week.  Pt also reports fall x 2 days ago.  C/o R side pain.  Bruising noted on pt's bila arms and R lower chest.  Pt's abd appears distended but soft.  Bowel sounds noted.

## 2012-06-01 NOTE — ED Provider Notes (Signed)
History     CSN: [id number]  Arrival date & time 06/01/12  1516   First MD Initiated Contact with Patient 06/01/12 1529      Chief Complaint  Patient presents with  . Weakness  . Hypotension     HPI  The patient presents with multiple concerns.  The patient was referred here from his PMDs office.  He presented there do to pain in his right rib cage.  The pain began following a fall, mechanical, 2 days prior to admission.  Patient states that he fell against a piece of furniture.  No loss of consciousness, no other significant trauma, no head contact.  Since the fall the pain has been sharp, worse with motion or deep inspiration.  No relief with anything.  Given the worsening pain, the patient was physician's office.  They're to Sunday hypotensive, with systolic blood pressure of approximately 80. The patient also states that over the past month he is become progressively more weak, increasingly unable to complete activities of daily living.  About one month ago the patient had an episode of bronchitis, which she was hospitalized.  On discharge she was sent home with supplemental oxygen.  He is anticoagulated for chronic A. fib.  Past Medical History  Diagnosis Date  . History of upper gastrointestinal hemorrhage   . Cerebrovascular accident   . Gastric polyp   . Dysphagia, unspecified   . Coronary artery disease     nonobstructive by cath 2009  . Permanent atrial fibrillation   . Hypertension   . Esophagitis   . GERD (gastroesophageal reflux disease)   . Erosive gastritis   . FH: colonic polyps   . Gastric ulcer   . Hiatal hernia   . Internal hemorrhoids     Past Surgical History  Procedure Date  . Cholecystectomy   . Asd repair [id number]  . Inguinal hernia repair 1963  . Partial small bowel resection 2005    ischemic?  [name] Enteroscopy 05/13/2012    Procedure: ENTEROSCOPY;  Surgeon: Louis Meckel, MD;  Location: [facility];  Service: Endoscopy;  Laterality: N/A;    No  family history on file.  History  Substance Use Topics  . Smoking status: Former Smoker -- 1.0 packs/day for 20 years    Types: Cigarettes    Quit date: 06/13/1983  . Smokeless tobacco: Never Used  . Alcohol Use: No      Review of Systems  Constitutional:       Per HPI, otherwise negative  HENT:       Per HPI, otherwise negative  Eyes: Negative.   Respiratory:       Per HPI, otherwise negative  Cardiovascular:       Per HPI, otherwise negative  Gastrointestinal: Negative for vomiting.  Genitourinary: Negative.   Musculoskeletal:       Per HPI, otherwise negative  Skin: Negative.   Neurological: Positive for weakness. Negative for syncope.    Allergies  Penicillins  Home Medications   Current Outpatient Rx  Name  Route  Sig  Dispense  Refill  . AVODART 0.5 MG PO CAPS   Oral   Take 0.5 mg by mouth daily.          [name] BISOPROLOL FUMARATE 5 MG PO TABS   Oral   Take 1 tablet (5 mg total) by mouth daily.   30 tablet   0   . DOXAZOSIN MESYLATE 4 MG PO TABS   Oral   Take 4 mg by  mouth daily.          [name] FERROUS SULFATE 325 (65 FE) MG PO TABS   Oral   Take 1 tablet (325 mg total) by mouth 2 (two) times daily with a meal.   60 tablet   0   . GABAPENTIN 600 MG PO TABS   Oral   Take 1 tablet (600 mg total) by mouth 3 (three) times daily.   90 tablet   6   . GUAIFENESIN 200 MG PO TABS   Oral   Take 1 tablet (200 mg total) by mouth every 6 (six) hours as needed.   30 suppository   0   . HYDROCHLOROTHIAZIDE 25 MG PO TABS      Take 1/2 tab daily   30 tablet   3   . IPRATROPIUM BROMIDE 0.02 % IN SOLN   Nebulization   Take 2.5 mLs (0.5 mg total) by nebulization 4 (four) times daily.   75 mL   2   . LEVALBUTEROL HCL 0.63 MG/3ML IN NEBU   Nebulization   Take 3 mLs (0.63 mg total) by nebulization 4 (four) times daily.   3 mL   2   . LOTRISONE 1-0.05 % EX CREA      APPLY TO AFFECTED AREAS TWICE A DAY   45 g   1   . PANTOPRAZOLE SODIUM 40 MG PO  TBEC   Oral   Take 1 tablet (40 mg total) by mouth 2 (two) times daily before a meal.   60 tablet   0   . WARFARIN SODIUM 5 MG PO TABS   Oral   Take 2.5-5 mg by mouth See admin instructions. Pt takes 2.5 mg every day except on Monday pt takes 5 mg         . ZOCOR 20 MG PO TABS      TAKE ONE TABLET AT BEDTIME   30 each   11     BP 106/59  Pulse 97  Temp 98.1 F (36.7 C) (Oral)  Resp 24  SpO2 94%  Physical Exam  Nursing note and vitals reviewed. Constitutional: He is oriented to person, place, and time. He appears well-developed. He has a sickly appearance.  HENT:  Head: Normocephalic and atraumatic.       Left facial droop.  The patient states this is chronic, from a history of Bell's palsy  Eyes: Conjunctivae normal and EOM are normal.  Cardiovascular: Normal rate.  An irregularly irregular rhythm present.  Pulmonary/Chest: No stridor. Tachypnea noted. He has decreased breath sounds.    Abdominal: Soft. Normal appearance. He exhibits no distension.  Musculoskeletal: He exhibits no edema.       Arms: Neurological: He is alert and oriented to person, place, and time. A cranial nerve deficit is present. He exhibits normal muscle tone. Coordination normal.       Facial asymmetry, but no other focal deficiencies.  Skin: Skin is warm and dry.  Psychiatric: He has a normal mood and affect.    ED Course  Procedures (including critical care time)   Labs Reviewed  CBC WITH DIFFERENTIAL  COMPREHENSIVE METABOLIC PANEL  LIPASE, BLOOD  PROTIME-INR   No results found.   No diagnosis found.  Cardiac 81 irregular abnormal Pulse ox 92% nasal cannula abnormal    Date: 06/01/2012  Rate: 86  Rhythm: atrial fibrillation  QRS Axis: normal  Intervals: afib  ST/T Wave abnormalities: ST depressions diffusely  Conduction Disutrbances:right bundle branch block LPFB  Narrative  Interpretation:   Old EKG Reviewed: changes noted Wider qrs, w st depressions,  abnormal   Update: initial K low, INR supratherapeutic (8+).  I discussed CXR findings w radiology and CT was ordered  Update: Patient blood pressure improves.  He denies any complaints.  I discussed the findings with the patient and his wife.  Update: CT demonstrates a hernia, opacification, concern for pulmonary artery hypertension, 2 undisplaced fractured ribs.  Update: This his blood pressure drops to 80/60, though with no mentation changes.  Additional fluid provided. I discussed the patient's case with the admitting hospital service.  He requests CT brain to rule out intracranial hemorrhage.  MDM  This elderly male, with recent initiation of home oxygen now presents with 2 days after a fall with concern of weakness.  Given his history of A. fib, the anticoagulation status, there is some concern for intrathoracic hemorrhage given the fall.  On exam the patient is mentating well, though he is initially hypotensive.  With this hypotension, he was empirically provided fluids, and given the supratherapeutic INR, this was corrected, and she received FFP as well.  With the hypokalemia he required potassium in addition.  The patient's evaluation was also notable for demonstration of findings concerning for pulmonary artery hypertension, as well as a significant hiatal hernia.  Given the complexity of his care, he'll be admitted to the step down floor under the care of the hospitalist service.  CRITICAL CARE Performed by: Gerhard Munch   Total critical care time: 45  Critical care time was exclusive of separately billable procedures and treating other patients.  Critical care was necessary to treat or prevent imminent or life-threatening deterioration.  Critical care was time spent personally by me on the following activities: development of treatment plan with patient and/or surrogate as well as nursing, discussions with consultants, evaluation of patient's response to treatment,  examination of patient, obtaining history from patient or surrogate, ordering and performing treatments and interventions, ordering and review of laboratory studies, ordering and review of radiographic studies, pulse oximetry and re-evaluation of patient's condition.       Gerhard Munch, MD 06/01/12 2122

## 2012-06-01 NOTE — ED Notes (Signed)
Per EMS, pt picked up from [facility] there for ?dehydration, weakness and hypotensive.  Per EMS, pt's SBP was 80 palpated sitting, and was not able to palpate radial pulse when pt was standing.  Pt received bolus NS.  Pt is A&O x 4.

## 2012-06-01 NOTE — ED Notes (Signed)
Critical PT 64.8 and INR 8.66 called by Rosey Bath from Lab.  Jeraldine Loots EDP

## 2012-06-01 NOTE — Progress Notes (Signed)
Subjective:    Patient ID: [name], male    DOB: 10-28-1932, 78 y.o.   MRN: [id number]  HPI Patient returns today with progressive weakness, frequent falls, and orthostatic type symptoms. Refer to recent note. He was seen here at our office on 05/06/2012 with dyspnea, cough, and fever. He was discharged on 05/15/2012 with dxes purulent bronchitis, acute respiratory failure, chronic atrial fibrillation, hypertension, history of CVA, GERD, esophageal stricture, and gastritis with hemorrhage. Patient finished 10 day course of Levaquin following discharge and was discharged home oxygen 2 L per minute. He's completed oral course of prednisone. Since discharge has become progressively more weak. His peripheral edema has improved. His weight is down 7 more pounds since last visit.  We received a call from home health care and they were concerned he may have rib fracture from fall. He has some right rib cage pain. Has abrasions right elbow. Very poor appetite. No reported fever. No dysuria. No nausea vomiting or diarrhea. Denies chest pains.  Chronic atrial fibrillation on Coumadin.  Past Medical History  Diagnosis Date  . History of upper gastrointestinal hemorrhage   . Cerebrovascular accident   . Gastric polyp   . Dysphagia, unspecified   . Coronary artery disease     nonobstructive by cath 2009  . Permanent atrial fibrillation   . Hypertension   . Esophagitis   . GERD (gastroesophageal reflux disease)   . Erosive gastritis   . FH: colonic polyps   . Gastric ulcer   . Hiatal hernia   . Internal hemorrhoids    Past Surgical History  Procedure Date  . Cholecystectomy   . Asd repair [id number]  . Inguinal hernia repair 1963  . Partial small bowel resection 2005    ischemic?  [name] Enteroscopy 05/13/2012    Procedure: ENTEROSCOPY;  Surgeon: Louis Meckel, MD;  Location: [facility];  Service: Endoscopy;  Laterality: N/A;    reports that he quit smoking about 28 years ago. His smoking  use included Cigarettes. He has a 20 pack-year smoking history. He has never used smokeless tobacco. He reports that he does not drink alcohol or use illicit drugs. family history is not on file. Allergies  Allergen Reactions  . Penicillins     hives      Review of Systems  Constitutional: Positive for appetite change, fatigue and unexpected weight change. Negative for fever and chills.  Respiratory: Positive for shortness of breath. Negative for cough and wheezing.   Cardiovascular: Negative for chest pain and palpitations.  Gastrointestinal: Negative for nausea, vomiting, abdominal pain and diarrhea.  Genitourinary: Negative for dysuria.  Neurological: Positive for dizziness, weakness and light-headedness. Negative for syncope.  Hematological: Bruises/bleeds easily.       Objective:   Physical Exam  Constitutional:       Very weak and debilitated appearing elderly gentleman. He has difficulty ambulating with walker and cannot transfer without assistance  HENT:       Oropharynx is dry  Neck: Neck supple.  Cardiovascular:       Irregular rhythm rate controlled  Pulmonary/Chest: Effort normal and breath sounds normal. No respiratory distress. He has no wheezes. He has no rales.  Abdominal: Soft. Bowel sounds are normal. He exhibits no distension. There is no tenderness.  Musculoskeletal: He exhibits edema.       Trace edema legs bilaterally  Lymphadenopathy:    He has no cervical adenopathy.  Skin:       Multiple areas of ecchymosis involving upper  extremities          Assessment & Plan:  Patient has recent acute respiratory failure and multiple other medical problems including history of recurrent upper GI bleed and atrial fibrillation on Coumadin who presents with progressive weakness and decline over the past couple of weeks since hospital discharge. He is orthostatic and appears dehydrated. Wife is having difficulty managing at home at this point. We've recommended  further hospital evaluation and they agree with this plan.  Patient will be sent by EMS as he is having difficulty with transfers

## 2012-06-01 NOTE — Telephone Encounter (Signed)
Pt coming in at 1:45 today, and Phys Therapist, Enrique Sack, from [facility] would like you to be aware of some things: patient had a fall and is sore on R side - hurts when he breathes - no visible bruising. [facility] asking that he be assessed for poss fx? Also, BP low, heart rate high, minimal fluid intake. Pt is also weak, and getting weaker. FYI - thanks.

## 2012-06-02 DIAGNOSIS — S2239XA Fracture of one rib, unspecified side, initial encounter for closed fracture: Secondary | ICD-10-CM

## 2012-06-02 DIAGNOSIS — K2971 Gastritis, unspecified, with bleeding: Secondary | ICD-10-CM

## 2012-06-02 DIAGNOSIS — K219 Gastro-esophageal reflux disease without esophagitis: Secondary | ICD-10-CM

## 2012-06-02 DIAGNOSIS — J449 Chronic obstructive pulmonary disease, unspecified: Secondary | ICD-10-CM

## 2012-06-02 DIAGNOSIS — R791 Abnormal coagulation profile: Secondary | ICD-10-CM

## 2012-06-02 DIAGNOSIS — I059 Rheumatic mitral valve disease, unspecified: Secondary | ICD-10-CM

## 2012-06-02 DIAGNOSIS — I959 Hypotension, unspecified: Secondary | ICD-10-CM

## 2012-06-02 DIAGNOSIS — I4891 Unspecified atrial fibrillation: Secondary | ICD-10-CM

## 2012-06-02 LAB — CULTURE, BLOOD (ROUTINE X 2)

## 2012-06-02 LAB — HEMOGLOBIN AND HEMATOCRIT, BLOOD: HCT: 28.4 % — ABNORMAL LOW (ref 39.0–52.0)

## 2012-06-02 LAB — BASIC METABOLIC PANEL
CO2: 33 mEq/L — ABNORMAL HIGH (ref 19–32)
Chloride: 100 mEq/L (ref 96–112)
Creatinine, Ser: 0.82 mg/dL (ref 0.50–1.35)
Glucose, Bld: 86 mg/dL (ref 70–99)

## 2012-06-02 LAB — MRSA PCR SCREENING: MRSA by PCR: POSITIVE — AB

## 2012-06-02 LAB — CBC
HCT: 25.3 % — ABNORMAL LOW (ref 39.0–52.0)
MCH: 29 pg (ref 26.0–34.0)
MCV: 87.2 fL (ref 78.0–100.0)
RBC: 2.9 MIL/uL — ABNORMAL LOW (ref 4.22–5.81)
RDW: 14.1 % (ref 11.5–15.5)
WBC: 3.9 10*3/uL — ABNORMAL LOW (ref 4.0–10.5)

## 2012-06-02 LAB — URINE CULTURE
Colony Count: NO GROWTH
Culture: NO GROWTH

## 2012-06-02 LAB — PROTIME-INR: INR: 1.39 (ref 0.00–1.49)

## 2012-06-02 LAB — GLUCOSE, CAPILLARY: Glucose-Capillary: 96 mg/dL (ref 70–99)

## 2012-06-02 LAB — TROPONIN I: Troponin I: <0.3 ng/mL (ref <0.30)

## 2012-06-02 MED ORDER — BIOTENE DRY MOUTH MT LIQD
15.0000 mL | Freq: Two times a day (BID) | OROMUCOSAL | Status: DC
  Administered 2012-06-02 – 2012-06-05 (×8): 15 mL via OROMUCOSAL

## 2012-06-02 MED ORDER — CHLORHEXIDINE GLUCONATE 0.12 % MT SOLN
15.0000 mL | Freq: Two times a day (BID) | OROMUCOSAL | Status: DC
  Administered 2012-06-02 – 2012-06-06 (×9): 15 mL via OROMUCOSAL
  Filled 2012-06-02 (×11): qty 15

## 2012-06-02 MED ORDER — CHLORHEXIDINE GLUCONATE CLOTH 2 % EX PADS
6.0000 | MEDICATED_PAD | Freq: Every day | CUTANEOUS | Status: AC
  Administered 2012-06-02 – 2012-06-06 (×5): 6 via TOPICAL

## 2012-06-02 MED ORDER — POTASSIUM CHLORIDE 10 MEQ/100ML IV SOLN
INTRAVENOUS | Status: AC
  Administered 2012-06-02: 10 meq
  Filled 2012-06-02: qty 100

## 2012-06-02 MED ORDER — POTASSIUM CHLORIDE 10 MEQ/100ML IV SOLN
INTRAVENOUS | Status: AC
  Filled 2012-06-02: qty 100

## 2012-06-02 MED ORDER — POTASSIUM CHLORIDE CRYS ER 20 MEQ PO TBCR
40.0000 meq | EXTENDED_RELEASE_TABLET | ORAL | Status: AC
  Administered 2012-06-02 – 2012-06-03 (×3): 40 meq via ORAL
  Filled 2012-06-02 (×3): qty 2

## 2012-06-02 MED ORDER — MIRTAZAPINE 15 MG PO TABS
15.0000 mg | ORAL_TABLET | Freq: Every day | ORAL | Status: DC
  Administered 2012-06-02 – 2012-06-05 (×4): 15 mg via ORAL
  Filled 2012-06-02 (×5): qty 1

## 2012-06-02 MED ORDER — PANTOPRAZOLE SODIUM 40 MG PO TBEC
40.0000 mg | DELAYED_RELEASE_TABLET | Freq: Every day | ORAL | Status: DC
  Administered 2012-06-02 – 2012-06-06 (×5): 40 mg via ORAL
  Filled 2012-06-02 (×5): qty 1

## 2012-06-02 MED ORDER — SUCRALFATE 1 GM/10ML PO SUSP
1.0000 g | Freq: Three times a day (TID) | ORAL | Status: DC
  Administered 2012-06-02 – 2012-06-06 (×15): 1 g via ORAL
  Filled 2012-06-02 (×20): qty 10

## 2012-06-02 MED ORDER — MUPIROCIN 2 % EX OINT
1.0000 "application " | TOPICAL_OINTMENT | Freq: Two times a day (BID) | CUTANEOUS | Status: DC
  Administered 2012-06-02 – 2012-06-06 (×9): 1 via NASAL
  Filled 2012-06-02 (×3): qty 22

## 2012-06-02 NOTE — Progress Notes (Signed)
[facility]  Patient Status: Active (receiving services up to time of hospitalization)  [facility] is providing the following services: RN, PT, OT and HHA  If patient discharges after hours, please call [phone].  Please resume HH at discharge if needed.  Thank you  Norberta Keens, RN, BSN [phone] 06/02/2012, 5:40 PM

## 2012-06-02 NOTE — Progress Notes (Signed)
[facility] PROGRESS NOTE  CRISTOFHER LIVECCHI ZOX:[id number] DOB: 07-20-1932 DOA: 06/01/2012 PCP: Kristian Covey, MD  Assessment/Plan: 1-hypotension/early shock: no signs or source of infection appreciated, changes on CXR/CT chest most likely reflecting recent lung process. So far CE'z negative. Patient BP improved by holding antihypertensive medications and given IVF's. Will continue monitoring closely his VS, continue IVF's and electrolytes repletion. Continue telemetry. Will move out of step down.  2-COPD: continue nebulizer and inhaler treatment. No wheezing.  3-atrial fibrillation" rate control. Coumadin supratherapeutic. Continue holding coumadin, continue bisoprostol.  4-right side rib fractures: will continue PRN pain meds and ask PT/OT to evaluate patient.  5-GERD/hx of hemorrhagic gastritis: continue PPI, will add carafate. Coumadin reverse. Holding coumadin, follow INR  6-anemia: Hgb 8.4; will follow Hgb trend. Continue iron. No transfusion at this moment.  7-DVT: scd's  8-Deconditioning: will ask PT/OT to evaluate  9-Depression, decrease appetite and insomnia: resume remeron. Start ensure TID.   Code Status: Full Family Communication: daughter at bedside Disposition Plan: patient will like to go home when medically stable; will ask PT/OT to evaluate. Transfer to telemetry floor.   Consultants:  none  Procedures:  CXR, CT head and CT chest (see below for reports)  2-D echo pending.  Antibiotics:  vanc and levaquin 12/30-12/31  HPI/Subjective: Afebrile; no cough, no significant SOB. CT chest demonstrating right side rib fracture and significant hiatal hernia. BP much better after IVF's. Denies lightheadedness.  Objective: Filed Vitals:   06/02/12 0700 06/02/12 0800 06/02/12 0900 06/02/12 1040  BP: 113/60 112/54 109/51   Pulse: 85 86 89   Temp:  98.5 F (36.9 C)    TempSrc:  Oral    Resp: 21 23 24    Height:      Weight:      SpO2: 99% 99% 98%  98%    Intake/Output Summary (Last 24 hours) at 06/02/12 1056 Last data filed at 06/02/12 0800  Gross per 24 hour  Intake   2320 ml  Output    425 ml  Net   1895 ml   Filed Weights   06/02/12 0030  Weight: 74.4 kg (164 lb 0.4 oz)    Exam:   General:  Weak, w/o appetite, no fever, speaking in full sentences  Cardiovascular: regular rate, no rubs or gallops  Respiratory: no wheezing, scattered rhonchi (especially at bases); no crackles  Abdomen: soft, NT, ND, positive BS  Extremities: no edema or cyanosis  Neuro: no focal deficit.  Data Reviewed: Basic Metabolic Panel:  Lab 06/02/12 8119 06/01/12 1550  NA 139 138  K 2.9* 2.7*  CL 100 96  CO2 33* 33*  GLUCOSE 86 105*  BUN 16 28*  CREATININE 0.82 1.33  CALCIUM 7.1* 7.7*  MG 1.3* --  PHOS 1.8* --   Liver Function Tests:  Lab 06/01/12 1550  AST 29  ALT 29  ALKPHOS 61  BILITOT 0.5  PROT 5.9*  ALBUMIN 2.2*    Lab 06/01/12 1550  LIPASE 30  AMYLASE --   CBC:  Lab 06/02/12 0745 06/02/12 0100 06/01/12 1550  WBC 3.9* -- 6.4  NEUTROABS -- -- 5.1  HGB 8.4* 9.5* 10.3*  HCT 25.3* 28.4* 31.9*  MCV 87.2 -- 87.4  PLT 145* -- 185   Cardiac Enzymes:  Lab 06/02/12 0745 06/02/12 0100 06/01/12 1550  CKTOTAL -- -- --  CKMB -- -- --  CKMBINDEX -- -- --  TROPONINI <0.30 <0.30 <0.30   CBG:  Lab 06/02/12 0359  GLUCAP 96    Recent  Results (from the past 240 hour(s))  MRSA PCR SCREENING     Status: Abnormal   Collection Time   06/02/12 12:56 AM      Component Value Range Status Comment   MRSA by PCR POSITIVE (*) NEGATIVE Final      Studies: Dg Ribs Unilateral W/chest Right  06/01/2012  *RADIOLOGY REPORT*  Clinical Data: Weakness and hypotension.  Right-sided pain. Shortness of breath.  RIGHT RIBS AND CHEST - 3+ VIEW  Comparison: Chest x-ray 05/09/2012.  Findings: Multiple dedicated views of the right ribs demonstrate no definite acute displaced rib fractures.  The lung volumes remain low.  There  continues to be extensive left sided retrocardiac opacity concerning for airspace consolidation.  Small - moderate left pleural effusion is similar to the prior examination. Bilateral hilar fullness is again noted, and is somewhat masslike, particularly on the right.  Mild pulmonary venous congestion without frank pulmonary edema.  Mild cardiomegaly is unchanged. Atherosclerosis in the thoracic aorta.  Status post median sternotomy.  IMPRESSION: 1.  No definite acute displaced right-sided rib fractures. 2.  Persistent left lower lobe consolidation concerning for pneumonia with small - moderate left parapneumonic pleural effusion. 3.  Persistent mass-like enlargement of the hilar regions bilaterally, particularly on the right.  Underlying malignancy or lymphadenopathy is not excluded.  Further evaluation with contrast enhanced CT of the thorax is recommended to better characterize these findings and to better evaluate the process in the left lower lobe.  The given the patient's symptoms, a PE protocol CT scan would be optimal, as this would also be able to exclude underlying pulmonary embolism.   Original Report Authenticated By: Trudie Reed, M.D.    Ct Head Wo Contrast  06/01/2012  *RADIOLOGY REPORT*  Clinical Data: 78 year old male fall.  Super therapeutic INR.  CT HEAD WITHOUT CONTRAST  Technique:  Contiguous axial images were obtained from the base of the skull through the vertex without contrast.  Comparison: [facility] Head CT 11/04/2008.  Findings: Trace fluid level in the left sphenoid sinus appears to be inflammatory.  Other Visualized paranasal sinuses and mastoids are clear.  Intermittent mild motion artifact.  Postoperative changes to the orbits, stable.  No scalp hematoma. No acute osseous abnormality identified.  Mild Calcified atherosclerosis at the skull base.  Stable cerebral volume.  No ventriculomegaly. No midline shift, mass effect, or evidence of mass lesion.  No acute  intracranial hemorrhage identified.  Patchy confluent white matter hypodensity. No evidence of cortically based acute infarction identified.  No suspicious intracranial vascular hyperdensity.  IMPRESSION: Stable volume loss and nonspecific white matter changes. No acute intracranial abnormality.   Original Report Authenticated By: Erskine Speed, M.D.    Ct Chest W Contrast  06/01/2012  *RADIOLOGY REPORT*  Clinical Data: Follow up abnormal chest x-ray  CT CHEST WITH CONTRAST  Technique:  Multidetector CT imaging of the chest was performed following the standard protocol during bolus administration of intravenous contrast.  Contrast: OMNIPAQUE IOHEXOL 300 MG/ML  SOLN  Comparison: Bilateral rib radiographs 06/01/2012; prior chest x-ray 05/09/2012  Findings:  Mediastinum: Unremarkable CT appearance of the thyroid gland.  No suspicious mediastinal or hilar adenopathy.  Large sliding hiatal hernia with intrathoracic stomach.  Only the distal antrum lies below the diaphragm.  Heart/Vascular: Marked enlargement of the main and central pulmonary arteries consistent with underlying pulmonary arterial hypertension.  No filling defect to suggest pulmonary embolus.  The heart is enlarged with left greater than right by atrial enlargement.  Atherosclerotic  vascular calcifications noted in the coronary arteries.  Status post median sternotomy with evidence of prior right internal mammary artery bypass.  No pericardial effusion.  Scattered atherosclerotic calcifications throughout the thoracic aorta without aneurysmal dilatation.  Four vessel arch. Left vertebral artery arises directly from the aorta.  Lungs/Pleura: Advance centrilobular emphysema. Bibasilar and nodular consolidation versus pleural parenchymal scarring. Additionally, there is peribronchovascular macro nodularity in the left upper lobe peripherally.  Upper Abdomen: Intrathoracic stomach as above.  Surgical changes of prior cholecystectomy.  Otherwise,  visualized upper abdominal contents unremarkable.  Bones: Acute nondisplaced fractures of the anterolateral aspect of right ribs six and seven.  IMPRESSION:  1. Acute nondisplaced fractures of the anterolateral aspect of right ribs six and seven 2.  Peribronchovascular macro nodularity in the periphery of the left upper lobe consistent with an underlying infectious/inflammatory process.  3.  Nodular consolidation versus pleural parenchymal scarring with associated volume loss in the bilateral lower lobes.  These findings are favored to reflect the sequela of chronic aspiration given the patient's large hiatal hernia.  Recommend follow-up chest CT in 3 months to assess stability.  4.  Massive pulmonary arterial enlargement and likely associated bi atrial enlargement is highly suggestive of pulmonary arterial hypertension.  This accounts for the appearance of a masslike hilar enlargement on the recent chest radiographs.  5.  Advanced centrilobular emphysema.  6.  Atherosclerosis including coronary artery disease.  7. Large sliding hiatal hernia with near total intrathoracic stomach.   Original Report Authenticated By: Malachy Moan, M.D.     Scheduled Meds:   . albuterol  2.5 mg Nebulization Q6H  . antiseptic oral rinse  15 mL Mouth Rinse q12n4p  . bisoprolol  5 mg Oral Daily  . chlorhexidine  15 mL Mouth Rinse BID  . Chlorhexidine Gluconate Cloth  6 each Topical Q0600  . dutasteride  0.5 mg Oral Daily  . ipratropium  0.5 mg Nebulization QID  . levofloxacin (LEVAQUIN) IV  750 mg Intravenous Q24H  . mupirocin ointment  1 application Nasal BID  . sodium chloride  3 mL Intravenous Q12H   Continuous Infusions:   . sodium chloride 100 mL/hr at 06/02/12 0802    Time spent: >30 minutes    Nester Bachus  [facility] Pager [phone]. If 8PM-8AM, please contact night-coverage at [email or web address], password [facility] 06/02/2012, 10:56 AM  LOS: 1 day

## 2012-06-02 NOTE — Progress Notes (Signed)
CARE MANAGEMENT NOTE 06/02/2012  Patient:  [name], [name]   Account Number:  [id number]  Date Initiated:  06/02/2012  Documentation initiated by:  Mister Krahenbuhl  Subjective/Objective Assessment:   pt with recent hx of pna now preasents due to hypotension and weaskness, admitted for volume support.     Action/Plan:   home   Anticipated DC Date:  06/05/2012   Anticipated DC Plan:  HOME/SELF CARE         Choice offered to / List presented to:             Status of service:  In process, will continue to follow Medicare Important Message given?  NA - LOS <3 / Initial given by admissions (If response is "NO", the following Medicare IM given date fields will be blank) Date Medicare IM given:   Date Additional Medicare IM given:    Discharge Disposition:    Per UR Regulation:  Reviewed for med. necessity/level of care/duration of stay  If discussed at Long Length of Stay Meetings, dates discussed:    Comments:  [id number] Earlene Plater, RN, BSN, CCM: CHART REVIEWED AND UPDATED.  Next chart review due on [id number]. NO DISCHARGE NEEDS PRESENT AT THIS TIME. CASE MANAGEMENT [phone]

## 2012-06-02 NOTE — Progress Notes (Signed)
  Echocardiogram 2D Echocardiogram has been performed.  [name] 06/02/2012, 9:36 AM

## 2012-06-03 DIAGNOSIS — I10 Essential (primary) hypertension: Secondary | ICD-10-CM

## 2012-06-03 DIAGNOSIS — R5383 Other fatigue: Secondary | ICD-10-CM

## 2012-06-03 LAB — BASIC METABOLIC PANEL
Chloride: 101 mEq/L (ref 96–112)
GFR calc Af Amer: >90 mL/min (ref >90)
Potassium: 3.6 mEq/L (ref 3.5–5.1)
Sodium: 136 mEq/L (ref 135–145)

## 2012-06-03 LAB — CBC
Platelets: 172 10*3/uL (ref 150–400)
RBC: 3.41 MIL/uL — ABNORMAL LOW (ref 4.22–5.81)
RDW: 14.4 % (ref 11.5–15.5)
WBC: 4.5 10*3/uL (ref 4.0–10.5)

## 2012-06-03 LAB — PREPARE FRESH FROZEN PLASMA: Unit division: 0

## 2012-06-03 MED ORDER — ENSURE COMPLETE PO LIQD
237.0000 mL | Freq: Three times a day (TID) | ORAL | Status: DC
  Administered 2012-06-03 – 2012-06-05 (×7): 237 mL via ORAL

## 2012-06-03 MED ORDER — SACCHAROMYCES BOULARDII 250 MG PO CAPS
250.0000 mg | ORAL_CAPSULE | Freq: Two times a day (BID) | ORAL | Status: DC
  Administered 2012-06-03 – 2012-06-06 (×6): 250 mg via ORAL
  Filled 2012-06-03 (×8): qty 1

## 2012-06-03 MED ORDER — WARFARIN - PHARMACIST DOSING INPATIENT: Freq: Every day | Status: DC

## 2012-06-03 MED ORDER — WARFARIN SODIUM 2 MG PO TABS
2.0000 mg | ORAL_TABLET | Freq: Once | ORAL | Status: AC
  Administered 2012-06-03: 2 mg via ORAL
  Filled 2012-06-03: qty 1

## 2012-06-03 NOTE — Evaluation (Signed)
Physical Therapy Evaluation Patient Details Name: [name] MRN: [id number] DOB: 10/21/1932 Today's Date: 06/03/2012 Time: 4098-1191 PT Time Calculation (min): 10 min  PT Assessment / Plan / Recommendation Clinical Impression  Pt admitted for generalized weakness and hypotension, also with R rib fxs from fall at home.  Pt would benefit from acute PT services in order to improve independence with transfers and ambulation as well as increase activity tolerance.  Pt reports feeling extremely fatigued after ambulating in hallway.  Pt also reports multiple falls at home due to "tripping over my own feet."    PT Assessment  Patient needs continued PT services    Follow Up Recommendations  Home health PT;Supervision for mobility/OOB    Does the patient have the potential to tolerate intense rehabilitation      Barriers to Discharge        Equipment Recommendations  None recommended by PT    Recommendations for Other Services     Frequency Min 3X/week    Precautions / Restrictions Precautions Precautions: Fall Restrictions Weight Bearing Restrictions: No   Pertinent Vitals/Pain Pt denied pain with mobility.      Mobility  Bed Mobility Bed Mobility: Not assessed Supine to Sit: 5: Supervision;HOB flat Sitting - Scoot to Edge of Bed: 5: Supervision Transfers Transfers: Sit to Stand;Stand to Sit Sit to Stand: 4: Min guard;With upper extremity assist;From chair/3-in-1 Stand to Sit: 4: Min guard;With upper extremity assist;To chair/3-in-1 Details for Transfer Assistance: verbal cues for safe technique Ambulation/Gait Ambulation/Gait Assistance: 4: Min guard Ambulation Distance (Feet): 100 Feet Assistive device: Rolling walker Ambulation/Gait Assistance Details: verbal cues for safety with RW, pt fatigues quickly, pt denied SOB but visibly more labored breathing upon return to recliner: SaO2 97% room air Gait Pattern: Step-through pattern;Decreased stride length;Trunk flexed      Shoulder Instructions     Exercises     PT Diagnosis: Difficulty walking;Generalized weakness  PT Problem List: Decreased strength;Decreased activity tolerance;Decreased mobility;Decreased knowledge of use of DME;Decreased balance PT Treatment Interventions: DME instruction;Gait training;Patient/family education;Functional mobility training;Therapeutic activities;Therapeutic exercise;Balance training;Neuromuscular re-education;Stair training   PT Goals Acute Rehab PT Goals PT Goal Formulation: With patient Time For Goal Achievement: 06/10/12 Potential to Achieve Goals: Good Pt will go Supine/Side to Sit: with modified independence PT Goal: Supine/Side to Sit - Progress: Goal set today Pt will go Sit to Supine/Side: with modified independence PT Goal: Sit to Supine/Side - Progress: Goal set today Pt will go Sit to Stand: with modified independence PT Goal: Sit to Stand - Progress: Goal set today Pt will go Stand to Sit: with modified independence PT Goal: Stand to Sit - Progress: Goal set today Pt will Ambulate: >150 feet;with modified independence;with least restrictive assistive device PT Goal: Ambulate - Progress: Goal set today  Visit Information  Last PT Received On: 06/03/12 Assistance Needed: +1    Subjective Data  Subjective: I'm so tired.   Prior Functioning  Home Living Lives With: Spouse Available Help at Discharge: Family;Available 24 hours/day Type of Home: House Home Access: Stairs to enter [employer] of Steps: 1 Entrance Stairs-Rails: None Home Layout: One level Bathroom Shower/Tub: Forensic scientist: Standard Bathroom Accessibility: Yes How Accessible: Accessible via walker Home Adaptive Equipment: Walker - rolling;Shower chair with back;Bedside commode/3-in-1 Prior Function Level of Independence: Independent with assistive device(s) (RW) Able to Take Stairs?: Yes Driving: Yes Vocation: Retired Comments: Spouse  reports pt has only been using RW since admission in early december. Communication Communication: No difficulties Dominant Hand:  Right    Cognition  Overall Cognitive Status: Appears within functional limits for tasks assessed/performed Arousal/Alertness: Awake/alert Orientation Level: Appears intact for tasks assessed Behavior During Session: [facility] for tasks performed    Extremity/Trunk Assessment Right Upper Extremity Assessment RUE ROM/Strength/Tone: Within functional levels Left Upper Extremity Assessment LUE ROM/Strength/Tone: Within functional levels Right Lower Extremity Assessment RLE ROM/Strength/Tone: Deficits RLE ROM/Strength/Tone Deficits: good initial strength however fatigued against resistance quickly Left Lower Extremity Assessment LLE ROM/Strength/Tone: Deficits LLE ROM/Strength/Tone Deficits: good initial strength however fatigued against resistance quickly   Balance    End of Session PT - End of Session Activity Tolerance: Patient limited by fatigue Patient left: in chair;with call bell/phone within reach;with family/visitor present  GP     Dorine Duffey,KATHrine E 06/03/2012, 4:43 PM Pager: [phone]

## 2012-06-03 NOTE — Progress Notes (Addendum)
ANTICOAGULATION CONSULT NOTE - Initial Consult  Pharmacy Consult for:  Warfarin Indication:  Atrial fibrillation  Allergies  Allergen Reactions  . Penicillins     hives    Patient Measurements: Height: 5\' 11"  (180.3 cm) Weight: 164 lb 0.4 oz (74.4 kg) IBW/kg (Calculated) : 75.3   Vital Signs: Temp: 98.3 F (36.8 C) (01/01 1429) Temp src: Oral (01/01 1429) BP: 108/67 mmHg (01/01 1429) Pulse Rate: 103  (01/01 1429)  Labs:  Basename 06/03/12 0529 06/02/12 1705 06/02/12 0745 06/02/12 0100 06/01/12 1550  HGB 9.7* -- 8.4* -- --  HCT 30.6* -- 25.3* 28.4* --  PLT 172 -- 145* -- 185  APTT -- -- -- -- --  LABPROT -- 16.7* -- -- 64.8*  INR -- 1.39 -- -- 8.66*  HEPARINUNFRC -- -- -- -- --  CREATININE 0.82 -- 0.82 -- 1.33  CKTOTAL -- -- -- -- --  CKMB -- -- -- -- --  TROPONINI -- -- <0.30 <0.30 <0.30    Estimated Creatinine Clearance: 79.4 ml/min (by C-G formula based on Cr of 0.82).   Medical History: Past Medical History  Diagnosis Date  . History of upper gastrointestinal hemorrhage   . Cerebrovascular accident   . Gastric polyp   . Dysphagia, unspecified   . Coronary artery disease     nonobstructive by cath 2009  . Permanent atrial fibrillation   . Hypertension   . Esophagitis   . GERD (gastroesophageal reflux disease)   . Erosive gastritis   . FH: colonic polyps   . Gastric ulcer   . Hiatal hernia   . Internal hemorrhoids     Medications:  Scheduled:    . albuterol  2.5 mg Nebulization Q6H  . antiseptic oral rinse  15 mL Mouth Rinse q12n4p  . bisoprolol  5 mg Oral Daily  . chlorhexidine  15 mL Mouth Rinse BID  . Chlorhexidine Gluconate Cloth  6 each Topical Q0600  . dutasteride  0.5 mg Oral Daily  . feeding supplement  237 mL Oral TID BM  . ipratropium  0.5 mg Nebulization QID  . mirtazapine  15 mg Oral QHS  . mupirocin ointment  1 application Nasal BID  . pantoprazole  40 mg Oral Daily  . [COMPLETED] potassium chloride  40 mEq Oral Q4H  .  saccharomyces boulardii  250 mg Oral BID  . sodium chloride  3 mL Intravenous Q12H  . sucralfate  1 g Oral TID WC & HS    Assessment:  Asked to assist with warfarin therapy for this 79 year-old male who takes warfarin at home due to atrial fibrillation.  The home dose is documented as 2.5 mg daily except for 5 mg on Mondays.  Admitted on 06/01/12 with weakness, hypotension, history of falls and supratherapeutic INR of 8.66.   Possible drug interaction between warfarin and Levaquin prescribed following last admission.  Received IV Vitamin K 10 mg and FFP on 12/30.  INR on 12/31 was 1.39.  Goal of Therapy:  INR 2-3   Plan:   Give warfarin 2 mg today.  Conservative doses due to current therapy with IV Levaquin.  Follow PT/INR daily.  Polo Riley R.Ph. 06/03/2012 7:03 PM

## 2012-06-03 NOTE — Evaluation (Signed)
Occupational Therapy Evaluation  Patient Details Name: [name] MRN: [id number] DOB: [birth year] Today's Date: 06/03/2012 Time: 1191-4782 OT Time Calculation (min): 27 min  OT Assessment / Plan / Recommendation Clinical Impression  This 79 yo male admitted with few days history of weakness and a fall, with right-sided chest pain (rib fractures found), and not eating well for several weeks presents to acute OT with problems below. Will benefit from acute OT with follow up HHOT.    OT Assessment  Patient needs continued OT Services    Follow Up Recommendations  Home health OT    Barriers to Discharge None    Equipment Recommendations  None recommended by OT       Frequency  Min 2X/week    Precautions / Restrictions Precautions Precautions: Fall Restrictions Weight Bearing Restrictions: No   Pertinent Vitals/Pain DOE    ADL  Eating/Feeding: Performed;Independent Where Assessed - Eating/Feeding: Chair Grooming: Simulated;Set up;Supervision/safety Where Assessed - Grooming: Unsupported sitting Upper Body Bathing: Simulated;Supervision/safety;Set up Where Assessed - Upper Body Bathing: Unsupported sitting Lower Body Bathing: Simulated;Minimal assistance Where Assessed - Lower Body Bathing: Supported sit to stand Upper Body Dressing: Simulated;Set up;Supervision/safety Where Assessed - Upper Body Dressing: Unsupported sitting Lower Body Dressing: Simulated;Minimal assistance Where Assessed - Lower Body Dressing: Supported sit to stand Toilet Transfer: Simulated;Minimal assistance Toilet Transfer Method: Sit to Barista:  (Bed out door and back to recliner) Toileting - Architect and Hygiene: Simulated;Min guard Where Assessed - Engineer, mining and Hygiene: Standing Equipment Used: Rolling walker;Gait belt Transfers/Ambulation Related to ADLs: Min A for all ADL Comments: Pt gets very dyspnic on exertion and required VCs  to do purse lipped breathing    OT Diagnosis: Generalized weakness  OT Problem List: Decreased strength;Decreased activity tolerance;Impaired balance (sitting and/or standing);Decreased knowledge of use of DME or AE;Cardiopulmonary status limiting activity OT Treatment Interventions: Self-care/ADL training;Energy conservation;DME and/or AE instruction;Patient/family education;Balance training   OT Goals Acute Rehab OT Goals OT Goal Formulation: With patient/family Time For Goal Achievement: 06/17/12 Potential to Achieve Goals: Good ADL Goals Pt Will Perform Grooming: with set-up;with supervision;Unsupported;Sitting at sink;Standing at sink (taking rest breaks prn without cues) ADL Goal: Grooming - Progress: Goal set today Pt Will Perform Lower Body Bathing: with set-up;with supervision;Unsupported;with adaptive equipment;Sitting, edge of bed;Sitting, chair;Sitting at sink ADL Goal: Lower Body Bathing - Progress: Goal set today Pt Will Perform Lower Body Dressing: with set-up;with supervision;Unsupported;Sit to stand from bed;Sit to stand from chair;with adaptive equipment ADL Goal: Lower Body Dressing - Progress: Goal set today Pt Will Transfer to Toilet: with supervision;Ambulation;with DME;Regular height toilet ADL Goal: Toilet Transfer - Progress: Goal set today Pt Will Perform Tub/Shower Transfer: with supervision;Ambulation;with DME;Shower seat with back ADL Goal: Web designer - Progress: Goal set today Miscellaneous OT Goals Miscellaneous OT Goal #1: Pt/family will be aware of energy conservation techniques from handout that will be beneficial to patient. OT Goal: Miscellaneous Goal #1 - Progress: Goal set today Miscellaneous OT Goal #2: Pt will use purse lipped breathing without cues prn. OT Goal: Miscellaneous Goal #2 - Progress: Goal set today  Visit Information  Last OT Received On: 06/03/12 Assistance Needed: +1       Prior Functioning     Home Living Lives  With: Spouse Available Help at Discharge: Family;Available 24 hours/day Type of Home: House Home Access: Stairs to enter [employer] of Steps: 1 Entrance Stairs-Rails: None Home Layout: One level Bathroom Shower/Tub: Forensic scientist: Standard Bathroom Accessibility: Yes  How Accessible: Accessible via walker Home Adaptive Equipment: Walker - rolling;Shower chair with back;Bedside commode/3-in-1 Prior Function Level of Independence: Independent with assistive device(s) (RW) Able to Take Stairs?: Yes Driving: Yes Vocation: Retired Musician: No difficulties Dominant Hand: Right            Cognition  Overall Cognitive Status: Appears within functional limits for tasks assessed/performed Arousal/Alertness: Awake/alert Orientation Level: Appears intact for tasks assessed Behavior During Session: [facility] for tasks performed    Extremity/Trunk Assessment Right Upper Extremity Assessment RUE ROM/Strength/Tone: Within functional levels Left Upper Extremity Assessment LUE ROM/Strength/Tone: Within functional levels     Mobility Bed Mobility Bed Mobility: Supine to Sit;Sitting - Scoot to Edge of Bed Supine to Sit: 5: Supervision;HOB flat Sitting - Scoot to Edge of Bed: 5: Supervision Transfers Transfers: Sit to Stand;Stand to Sit Sit to Stand: 4: Min assist;With upper extremity assist;From bed Stand to Sit: 4: Min assist;With upper extremity assist;With armrests;To chair/3-in-1              End of Session OT - End of Session Equipment Utilized During Treatment: Gait belt Activity Tolerance:  (Limited by DOE) Patient left: in chair;with call bell/phone within reach;with family/visitor present (wife) Nurse Communication: Mobility status (+1, O2 on RA 100%--nurse to recheck later)       Evette Georges [phone] 06/03/2012, 3:05 PM

## 2012-06-03 NOTE — Progress Notes (Signed)
[facility] PROGRESS NOTE  [name]:[id number] DOB: Oct 12, 1932 DOA: 06/01/2012 PCP: Kristian Covey, MD  Assessment/Plan: 1-hypotension/early shock: no signs or source of infection appreciated, changes on CXR/CT chest most likely reflecting recent lung process. So far CE'z negative. Patient BP improved by holding antihypertensive medications and given IVF's. Will continue monitoring closely his VS, change IVF's to maintenance dose; advance diet and follow electrolytes in am. Continue telemetry. If continue to be stable will probably discharge in am.  2-COPD: continue nebulizer and inhaler treatment. No wheezing.  3-atrial fibrillation" rate control. Continue bisoprostol; coumadin per pharmacy. INR  4-right side rib fractures: will continue PRN pain meds. HH services to be resumed at discharge  5-GERD/hx of hemorrhagic gastritis: continue PPI and carafate. Coumadin reverse.  Coumadin dose to be managed by pharmacy  6-anemia: Hgb 9.7; will follow Hgb trend. Continue iron. No transfusion at this moment.  7-DVT: Continue scd's  8-Deconditioning: Per PT/OT ok to go home with [facility] services. Will resume advance home care at discharge  9-Depression, decrease appetite and insomnia: Continue remeron. Continue ensure TID.  10-Diarrhea: patient on recent abx's. Will check C. Diff and start florastor.   Code Status: Full Family Communication: daughter at bedside Disposition Plan: patient will like to go home when medically stable; will ask PT/OT to evaluate. Transfer to telemetry floor.   Consultants:  none  Procedures:  CXR, CT head and CT chest (see below for reports)  2-D echo pending.  Antibiotics:  vanc and levaquin 12/30-12/31  HPI/Subjective: Afebrile; no cough, no significant SOB. Complaining of diarrhea.  Objective: Filed Vitals:   06/03/12 1027 06/03/12 1041 06/03/12 1429 06/03/12 1521  BP: 120/74  108/67   Pulse: 104  103   Temp:   98.3 F (36.8 C)    TempSrc:   Oral   Resp:   20   Height:      Weight:      SpO2:  98% 100% 97%    Intake/Output Summary (Last 24 hours) at 06/03/12 1820 Last data filed at 06/03/12 1500  Gross per 24 hour  Intake   2525 ml  Output    650 ml  Net   1875 ml   Filed Weights   06/02/12 0030  Weight: 74.4 kg (164 lb 0.4 oz)    Exam:   General:  Feeling better; no fever, no abdominal pain and no N/V. Denies SOB.   Cardiovascular: regular rate, no rubs or gallops  Respiratory: no wheezing, scattered rhonchi (especially at bases); no crackles  Abdomen: soft, NT, ND, positive BS  Extremities: no edema or cyanosis  Neuro: no focal deficit.  Data Reviewed: Basic Metabolic Panel:  Lab 06/03/12 8119 06/02/12 0745 06/01/12 1550  NA 136 139 138  K 3.6 2.9* 2.7*  CL 101 100 96  CO2 29 33* 33*  GLUCOSE 93 86 105*  BUN 8 16 28*  CREATININE 0.82 0.82 1.33  CALCIUM 7.6* 7.1* 7.7*  MG -- 1.3* --  PHOS -- 1.8* --   Liver Function Tests:  Lab 06/01/12 1550  AST 29  ALT 29  ALKPHOS 61  BILITOT 0.5  PROT 5.9*  ALBUMIN 2.2*    Lab 06/01/12 1550  LIPASE 30  AMYLASE --   CBC:  Lab 06/03/12 0529 06/02/12 0745 06/02/12 0100 06/01/12 1550  WBC 4.5 3.9* -- 6.4  NEUTROABS -- -- -- 5.1  HGB 9.7* 8.4* 9.5* 10.3*  HCT 30.6* 25.3* 28.4* 31.9*  MCV 89.7 87.2 -- 87.4  PLT 172 145* --  185   Cardiac Enzymes:  Lab 06/02/12 0745 06/02/12 0100 06/01/12 1550  CKTOTAL -- -- --  CKMB -- -- --  CKMBINDEX -- -- --  TROPONINI <0.30 <0.30 <0.30   CBG:  Lab 06/02/12 0359  GLUCAP 96    Recent Results (from the past 240 hour(s))  URINE CULTURE     Status: Normal   Collection Time   06/01/12  8:50 PM      Component Value Range Status Comment   Specimen Description URINE, CLEAN CATCH   Final    Special Requests NONE   Final    Culture  Setup Time 06/02/2012 03:01   Final    Colony Count NO GROWTH   Final    Culture NO GROWTH   Final    Report Status 06/02/2012 FINAL   Final   MRSA PCR  SCREENING     Status: Abnormal   Collection Time   06/02/12 12:56 AM      Component Value Range Status Comment   MRSA by PCR POSITIVE (*) NEGATIVE Final      Studies: Ct Head Wo Contrast  06/01/2012  *RADIOLOGY REPORT*  Clinical Data: 79 year old male fall.  Super therapeutic INR.  CT HEAD WITHOUT CONTRAST  Technique:  Contiguous axial images were obtained from the base of the skull through the vertex without contrast.  Comparison: [facility] Head CT 11/04/2008.  Findings: Trace fluid level in the left sphenoid sinus appears to be inflammatory.  Other Visualized paranasal sinuses and mastoids are clear.  Intermittent mild motion artifact.  Postoperative changes to the orbits, stable.  No scalp hematoma. No acute osseous abnormality identified.  Mild Calcified atherosclerosis at the skull base.  Stable cerebral volume.  No ventriculomegaly. No midline shift, mass effect, or evidence of mass lesion.  No acute intracranial hemorrhage identified.  Patchy confluent white matter hypodensity. No evidence of cortically based acute infarction identified.  No suspicious intracranial vascular hyperdensity.  IMPRESSION: Stable volume loss and nonspecific white matter changes. No acute intracranial abnormality.   Original Report Authenticated By: Erskine Speed, M.D.    Ct Chest W Contrast  06/01/2012  *RADIOLOGY REPORT*  Clinical Data: Follow up abnormal chest x-ray  CT CHEST WITH CONTRAST  Technique:  Multidetector CT imaging of the chest was performed following the standard protocol during bolus administration of intravenous contrast.  Contrast: OMNIPAQUE IOHEXOL 300 MG/ML  SOLN  Comparison: Bilateral rib radiographs 06/01/2012; prior chest x-ray 05/09/2012  Findings:  Mediastinum: Unremarkable CT appearance of the thyroid gland.  No suspicious mediastinal or hilar adenopathy.  Large sliding hiatal hernia with intrathoracic stomach.  Only the distal antrum lies below the diaphragm.   Heart/Vascular: Marked enlargement of the main and central pulmonary arteries consistent with underlying pulmonary arterial hypertension.  No filling defect to suggest pulmonary embolus.  The heart is enlarged with left greater than right by atrial enlargement.  Atherosclerotic vascular calcifications noted in the coronary arteries.  Status post median sternotomy with evidence of prior right internal mammary artery bypass.  No pericardial effusion.  Scattered atherosclerotic calcifications throughout the thoracic aorta without aneurysmal dilatation.  Four vessel arch. Left vertebral artery arises directly from the aorta.  Lungs/Pleura: Advance centrilobular emphysema. Bibasilar and nodular consolidation versus pleural parenchymal scarring. Additionally, there is peribronchovascular macro nodularity in the left upper lobe peripherally.  Upper Abdomen: Intrathoracic stomach as above.  Surgical changes of prior cholecystectomy.  Otherwise, visualized upper abdominal contents unremarkable.  Bones: Acute nondisplaced fractures of the anterolateral  aspect of right ribs six and seven.  IMPRESSION:  1. Acute nondisplaced fractures of the anterolateral aspect of right ribs six and seven 2.  Peribronchovascular macro nodularity in the periphery of the left upper lobe consistent with an underlying infectious/inflammatory process.  3.  Nodular consolidation versus pleural parenchymal scarring with associated volume loss in the bilateral lower lobes.  These findings are favored to reflect the sequela of chronic aspiration given the patient's large hiatal hernia.  Recommend follow-up chest CT in 3 months to assess stability.  4.  Massive pulmonary arterial enlargement and likely associated bi atrial enlargement is highly suggestive of pulmonary arterial hypertension.  This accounts for the appearance of a masslike hilar enlargement on the recent chest radiographs.  5.  Advanced centrilobular emphysema.  6.  Atherosclerosis  including coronary artery disease.  7. Large sliding hiatal hernia with near total intrathoracic stomach.   Original Report Authenticated By: Malachy Moan, M.D.     Scheduled Meds:    . albuterol  2.5 mg Nebulization Q6H  . antiseptic oral rinse  15 mL Mouth Rinse q12n4p  . bisoprolol  5 mg Oral Daily  . chlorhexidine  15 mL Mouth Rinse BID  . Chlorhexidine Gluconate Cloth  6 each Topical Q0600  . dutasteride  0.5 mg Oral Daily  . ipratropium  0.5 mg Nebulization QID  . mirtazapine  15 mg Oral QHS  . mupirocin ointment  1 application Nasal BID  . pantoprazole  40 mg Oral Daily  . saccharomyces boulardii  250 mg Oral BID  . sodium chloride  3 mL Intravenous Q12H  . sucralfate  1 g Oral TID WC & HS   Continuous Infusions:    . sodium chloride 100 mL/hr at 06/02/12 0802    Time spent: >30 minutes    Leonel Mccollum  [facility] Pager [phone]. If 8PM-8AM, please contact night-coverage at [email or web address], password [facility] 06/03/2012, 6:20 PM  LOS: 2 days

## 2012-06-04 ENCOUNTER — Other Ambulatory Visit: Payer: Self-pay | Admitting: *Deleted

## 2012-06-04 DIAGNOSIS — R5381 Other malaise: Secondary | ICD-10-CM

## 2012-06-04 DIAGNOSIS — E876 Hypokalemia: Secondary | ICD-10-CM

## 2012-06-04 LAB — CBC
Hemoglobin: 9.5 g/dL — ABNORMAL LOW (ref 13.0–17.0)
MCHC: 32.6 g/dL (ref 30.0–36.0)
RBC: 3.31 MIL/uL — ABNORMAL LOW (ref 4.22–5.81)
WBC: 4.2 10*3/uL (ref 4.0–10.5)

## 2012-06-04 LAB — CLOSTRIDIUM DIFFICILE BY PCR: Toxigenic C. Difficile by PCR: NEGATIVE

## 2012-06-04 LAB — BASIC METABOLIC PANEL
CO2: 28 mEq/L (ref 19–32)
Chloride: 104 mEq/L (ref 96–112)
GFR calc non Af Amer: 87 mL/min — ABNORMAL LOW (ref >90)
Glucose, Bld: 98 mg/dL (ref 70–99)
Potassium: 3.1 mEq/L — ABNORMAL LOW (ref 3.5–5.1)
Sodium: 137 mEq/L (ref 135–145)

## 2012-06-04 LAB — PROTIME-INR
INR: 1.38 (ref 0.00–1.49)
Prothrombin Time: 16.6 seconds — ABNORMAL HIGH (ref 11.6–15.2)

## 2012-06-04 LAB — PHOSPHORUS: Phosphorus: 1.6 mg/dL — ABNORMAL LOW (ref 2.3–4.6)

## 2012-06-04 MED ORDER — MAGNESIUM SULFATE 40 MG/ML IJ SOLN
2.0000 g | Freq: Once | INTRAMUSCULAR | Status: AC
  Administered 2012-06-04: 2 g via INTRAVENOUS
  Filled 2012-06-04: qty 50

## 2012-06-04 MED ORDER — BISOPROLOL FUMARATE 10 MG PO TABS
10.0000 mg | ORAL_TABLET | Freq: Every day | ORAL | Status: DC
  Administered 2012-06-05 – 2012-06-06 (×2): 10 mg via ORAL
  Filled 2012-06-04 (×2): qty 1

## 2012-06-04 MED ORDER — POTASSIUM CHLORIDE CRYS ER 20 MEQ PO TBCR
40.0000 meq | EXTENDED_RELEASE_TABLET | ORAL | Status: AC
  Administered 2012-06-04 (×3): 40 meq via ORAL
  Filled 2012-06-04 (×3): qty 2

## 2012-06-04 MED ORDER — POTASSIUM PHOSPHATE DIBASIC 3 MMOLE/ML IV SOLN
20.0000 meq | Freq: Once | INTRAVENOUS | Status: AC
  Administered 2012-06-04: 20 meq via INTRAVENOUS
  Filled 2012-06-04: qty 4.55

## 2012-06-04 MED ORDER — DIPHENOXYLATE-ATROPINE 2.5-0.025 MG PO TABS
2.0000 | ORAL_TABLET | Freq: Four times a day (QID) | ORAL | Status: DC | PRN
  Administered 2012-06-04 – 2012-06-05 (×2): 2 via ORAL
  Filled 2012-06-04 (×2): qty 2

## 2012-06-04 MED ORDER — POTASSIUM CHLORIDE CRYS ER 20 MEQ PO TBCR
40.0000 meq | EXTENDED_RELEASE_TABLET | ORAL | Status: AC
  Administered 2012-06-04 – 2012-06-05 (×2): 40 meq via ORAL
  Filled 2012-06-04 (×2): qty 2

## 2012-06-04 MED ORDER — WARFARIN SODIUM 2.5 MG PO TABS
2.5000 mg | ORAL_TABLET | Freq: Once | ORAL | Status: AC
  Administered 2012-06-04: 2.5 mg via ORAL
  Filled 2012-06-04: qty 1

## 2012-06-04 MED ORDER — FUROSEMIDE 20 MG PO TABS: 20.0000 mg | ORAL_TABLET | Freq: Every day | ORAL | Status: DC

## 2012-06-04 NOTE — Progress Notes (Signed)
Occupational Therapy Treatment Patient Details Name: [name] MRN: [id number] DOB: 1932/12/20 Today's Date: 06/04/2012 Time: 2841-3244 OT Time Calculation (min): 32 min  OT Assessment / Plan / Recommendation Comments on Treatment Session Pt still with significant fatigue and sypnea on exertion, however O2 sats remain at 94% or greater on room air during session.  Still needs supervision for mobility and selfcare.  Feel wife can provide this as well.  Recommend HHOT to help further evaluate home setup and make recommendations as well as continuing progression to a modified independent level.    Follow Up Recommendations  Home health OT       Equipment Recommendations  None recommended by OT;Other (comment) (may pursue shower tub bench but will decide.)       Frequency Min 2X/week   Plan Discharge plan remains appropriate    Precautions / Restrictions Precautions Precautions: Fall Restrictions Weight Bearing Restrictions: No   Pertinent Vitals/Pain Pt with no report of significant pain, HR 104 at rest, increasing to 121 with activity, dyspnea 3/4 as well O2 sats 94% or greater with all activity.    ADL  Grooming: Performed;Shaving;Supervision/safety Where Assessed - Grooming: Supported standing Toilet Transfer: Buyer, retail Method: Other (comment) (ambulate with RW) Acupuncturist: Comfort height toilet;Grab bars Toileting - Clothing Manipulation and Hygiene: Simulated;Supervision/safety Where Assessed - Toileting Clothing Manipulation and Hygiene: Sit to stand from 3-in-1 or toilet Equipment Used: Rolling walker Transfers/Ambulation Related to ADLs: Pt overall supervision for mobility with the RW. ADL Comments: Pt able to tolerate standing or mobility for approxmately 2 mins before needing rest break.  Pt with O2 sats at 98% on room air at rest, with HR at 104.  With mobility and grooming in standing O2 decreased to 94% with HR  elevating to 121.  Educated pt and spouse on energy conservation strategies and provided handout.        OT Goals ADL Goals ADL Goal: Grooming - Progress: Met ADL Goal: Toilet Transfer - Progress: Met Miscellaneous OT Goals OT Goal: Miscellaneous Goal #1 - Progress: Progressing toward goals OT Goal: Miscellaneous Goal #2 - Progress: Progressing toward goals  Visit Information  Last OT Received On: 06/04/12 Assistance Needed: +1    Subjective Data  Subjective: I can do some more activity. Patient Stated Goal: Pt ddid not state but agreeable to participate in therapy.      Cognition  Overall Cognitive Status: Appears within functional limits for tasks assessed/performed Arousal/Alertness: Awake/alert Orientation Level: Appears intact for tasks assessed Behavior During Session: [facility] for tasks performed    Mobility  Shoulder Instructions Bed Mobility Supine to Sit: 6: Modified independent (Device/Increase time);HOB elevated Transfers Transfers: Sit to Stand Sit to Stand: 5: Supervision;With upper extremity assist;With armrests;From chair/3-in-1 Stand to Sit: 5: Supervision;With upper extremity assist;With armrests;To toilet Details for Transfer Assistance: increased time and effort to rise from lower surface       Exercises  General Exercises - Lower Extremity Ankle Circles/Pumps: AROM;Both;20 reps Long Arc Quad: AROM;Both;15 reps;Seated Hip ABduction/ADduction: AROM;Both;15 reps;Other (comment) (also same with pillow squeezes) Straight Leg Raises: AROM;Both;10 reps Hip Flexion/Marching: AROM;Both;20 reps   Balance Balance Balance Assessed: Yes Dynamic Standing Balance Dynamic Standing - Balance Support: Bilateral upper extremity supported Dynamic Standing - Level of Assistance: 5: Stand by assistance   End of Session OT - End of Session Activity Tolerance: Patient limited by fatigue Patient left: in chair;with call bell/phone within reach;with family/visitor  present Nurse Communication: Mobility status     Joanny Dupree  OTR/L Pager number [phone] 06/04/2012, 2:45 PM

## 2012-06-04 NOTE — Progress Notes (Signed)
Physical Therapy Treatment Patient Details Name: [name] MRN: [id number] DOB: 10-24-1932 Today's Date: 06/04/2012 Time: 1324-4010 PT Time Calculation (min): 32 min  PT Assessment / Plan / Recommendation Comments on Treatment Session  Pt ambulated in hallway taking a few rest breaks for fatigue and then performed exercises in recliner.  Monitored HR and SaO2 and HR highest 121 bpm after ambulation and pt on room air with SaO2 >96%.    Follow Up Recommendations  Home health PT;Supervision for mobility/OOB     Does the patient have the potential to tolerate intense rehabilitation     Barriers to Discharge        Equipment Recommendations  None recommended by PT    Recommendations for Other Services    Frequency     Plan Discharge plan remains appropriate;Frequency remains appropriate    Precautions / Restrictions Precautions Precautions: Fall   Pertinent Vitals/Pain No pain    Mobility  Bed Mobility Supine to Sit: 6: Modified independent (Device/Increase time);HOB elevated Transfers Transfers: Sit to Stand;Stand to Sit Sit to Stand: 4: Min guard;With upper extremity assist;From chair/3-in-1 Stand to Sit: 4: Min guard;With upper extremity assist;To chair/3-in-1 Details for Transfer Assistance: increased time and effort to rise from lower surface Ambulation/Gait Ambulation/Gait Assistance: 4: Min guard Ambulation Distance (Feet): 100 Feet Assistive device: Rolling walker Ambulation/Gait Assistance Details: 3 standing rest breaks required due to fatigue, SaO2 98% room air and HR 121 upon return to recliner Gait Pattern: Step-through pattern;Decreased stride length;Trunk flexed Gait velocity: decreased    Exercises General Exercises - Lower Extremity Ankle Circles/Pumps: AROM;Both;20 reps Long Arc Quad: AROM;Both;15 reps;Seated Hip ABduction/ADduction: AROM;Both;15 reps;Other (comment) (also same with pillow squeezes) Straight Leg Raises: AROM;Both;10 reps Hip  Flexion/Marching: AROM;Both;20 reps   PT Diagnosis:    PT Problem List:   PT Treatment Interventions:     PT Goals Acute Rehab PT Goals PT Goal: Supine/Side to Sit - Progress: Met PT Goal: Sit to Stand - Progress: Progressing toward goal PT Goal: Stand to Sit - Progress: Progressing toward goal PT Goal: Ambulate - Progress: Progressing toward goal  Visit Information  Last PT Received On: 06/04/12 Assistance Needed: +1    Subjective Data  Subjective: I've just been up and down to the toilet this morning.   Cognition  Overall Cognitive Status: Appears within functional limits for tasks assessed/performed Arousal/Alertness: Awake/alert Orientation Level: Appears intact for tasks assessed Behavior During Session: [facility] for tasks performed    Balance     End of Session PT - End of Session Activity Tolerance: Patient limited by fatigue Patient left: in chair;with call bell/phone within reach Nurse Communication: Other (comment) (pt SaO2 above 97%, left off oxygen)   GP     [place] Guiles,KATHrine E 06/04/2012, 12:31 PM Pager: [phone]

## 2012-06-04 NOTE — Progress Notes (Signed)
ANTICOAGULATION CONSULT NOTE - Follow Up Consult  Pharmacy Consult for Coumadin Indication: Atrial Fibrillation  Allergies  Allergen Reactions  . Penicillins     hives    Patient Measurements: Height: 5\' 11"  (180.3 cm) Weight: 164 lb 0.4 oz (74.4 kg) IBW/kg (Calculated) : 75.3  Heparin Dosing Weight:   Vital Signs: Temp: 97.9 F (36.6 C) (01/02 1004) Temp src: Oral (01/02 1004) BP: 111/55 mmHg (01/02 1004) Pulse Rate: 120  (01/02 1004)  Labs:  Basename 06/04/12 0453 06/03/12 0529 06/02/12 1705 06/02/12 0745 06/02/12 0100 06/01/12 1550  HGB 9.5* 9.7* -- -- -- --  HCT 29.1* 30.6* -- 25.3* -- --  PLT 192 172 -- 145* -- --  APTT -- -- -- -- -- --  LABPROT 16.6* -- 16.7* -- -- 64.8*  INR 1.38 -- 1.39 -- -- 8.66*  HEPARINUNFRC -- -- -- -- -- --  CREATININE 0.73 0.82 -- 0.82 -- --  CKTOTAL -- -- -- -- -- --  CKMB -- -- -- -- -- --  TROPONINI -- -- -- <0.30 <0.30 <0.30    Estimated Creatinine Clearance: 81.4 ml/min (by C-G formula based on Cr of 0.73).   Medications:  Home dose Coumadin:  2.5mg  daily except 5mg  on Monday.   Assessment: 79 yoM on chronic coumadin for Afib admitted with supratherapeutic INR, now s/p Vit K IV administration.  INR remains subtherapeutic after 1st dose Coumadin.  Hgb, plts ok.  No bleed noted.  IV levaquin was d/c'd 12/31.  Renal fxn stable.    Goal of Therapy:  INR 2-3 Monitor platelets by anticoagulation protocol: Yes   Plan:  1.  Coumadin 2.5mg  po x 1 tonight.  2.  F/u daily PT/INR, CBC.     Augustina Braddock E 06/04/2012,1:00 PM

## 2012-06-04 NOTE — Progress Notes (Signed)
Pt HR sustaining 130s to low 140 while on bedside commode. BP 111/55.  Asymptomatic.  MD notified.

## 2012-06-04 NOTE — Progress Notes (Signed)
[facility] PROGRESS NOTE  [name] ZOX:[id number] DOB: 19-Apr-1933 DOA: 06/01/2012 PCP: Kristian Covey, MD  Assessment/Plan: 1-hypotension/early shock: no signs or source of infection appreciated, changes on CXR/CT chest most likely reflecting recent lung process. So far CE'z negative. Patient BP improved by holding antihypertensive medications and given IVF's. Will continue monitoring closely his VS, change IVF's to maintenance dose; advance diet and follow electrolytes in am. Continue telemetry.   2-COPD: continue nebulizer and inhaler treatment. No wheezing.  3-atrial fibrillation: rate elevated today; bisoprostol dose adjusted to 10mg  daily; coumadin per pharmacy. INR 1.38; electrolytes repletion. Continue telemetry evaluation  4-right side rib fractures: will continue PRN pain meds. HH services to be resumed at discharge  5-GERD/hx of hemorrhagic gastritis: continue PPI and carafate. Coumadin reverse.  Coumadin dose to be managed by pharmacy  6-anemia: Hgb 9.7; will follow Hgb trend. Continue iron. No transfusion at this moment.  7-DVT: Continue scd's  8-Deconditioning: Per PT/OT ok to go home with [facility] services. Will resume advance home care at discharge  9-Depression, decrease appetite and insomnia: Continue remeron. Continue ensure TID.  10-Diarrhea: patient on recent abx's. C. Diff negative. Will continue florastor and treat diarrhea symptomatically with lomotil.  11-Hypokalemia/hypomagnesemia/hypophosphatemia: will replete and follow labs in am. Most likely associated with diarrhea.   Code Status: Full Family Communication: daughter at bedside Disposition Plan: patient will like to go home when medically stable; will ask PT/OT to evaluate.   Consultants:  none  Procedures:  CXR, CT head and CT chest (see below for reports)  2-D echo w/o wall motion abnormalities, EF 45-50% with mild reduce systolic function.  Antibiotics:  vanc and levaquin  12/30-12/31  HPI/Subjective: Afebrile; no cough, no significant SOB. Complaining of diarrhea.  Objective: Filed Vitals:   06/04/12 0803 06/04/12 1004 06/04/12 1358 06/04/12 1433  BP:  111/55 114/67   Pulse:  120 102 104  Temp:  97.9 F (36.6 C) 97.5 F (36.4 C)   TempSrc:  Oral Oral   Resp:  20 20   Height:      Weight:      SpO2: 98% 98% 97% 98%    Intake/Output Summary (Last 24 hours) at 06/04/12 1933 Last data filed at 06/04/12 1845  Gross per 24 hour  Intake 2415.38 ml  Output    325 ml  Net 2090.38 ml   Filed Weights   06/02/12 0030  Weight: 74.4 kg (164 lb 0.4 oz)    Exam:   General:  Feeling better; no fever, no abdominal pain and no N/V. Complaining of diarrhea   Cardiovascular: tachycardic, no rubs or gallops  Respiratory: no wheezing, scattered rhonchi (especially at bases); no crackles  Abdomen: soft, NT, ND, positive BS  Extremities: no edema or cyanosis  Neuro: no focal deficit.  Data Reviewed: Basic Metabolic Panel:  Lab 06/04/12 8119 06/03/12 0529 06/02/12 0745 06/01/12 1550  NA 137 136 139 138  K 3.1* 3.6 2.9* 2.7*  CL 104 101 100 96  CO2 28 29 33* 33*  GLUCOSE 98 93 86 105*  BUN 7 8 16  28*  CREATININE 0.73 0.82 0.82 1.33  CALCIUM 7.6* 7.6* 7.1* 7.7*  MG 1.1* -- 1.3* --  PHOS 1.6* -- 1.8* --   Liver Function Tests:  Lab 06/01/12 1550  AST 29  ALT 29  ALKPHOS 61  BILITOT 0.5  PROT 5.9*  ALBUMIN 2.2*    Lab 06/01/12 1550  LIPASE 30  AMYLASE --   CBC:  Lab 06/04/12 0453 06/03/12 0529  06/02/12 0745 06/02/12 0100 06/01/12 1550  WBC 4.2 4.5 3.9* -- 6.4  NEUTROABS -- -- -- -- 5.1  HGB 9.5* 9.7* 8.4* 9.5* 10.3*  HCT 29.1* 30.6* 25.3* 28.4* 31.9*  MCV 87.9 89.7 87.2 -- 87.4  PLT 192 172 145* -- 185   Cardiac Enzymes:  Lab 06/02/12 0745 06/02/12 0100 06/01/12 1550  CKTOTAL -- -- --  CKMB -- -- --  CKMBINDEX -- -- --  TROPONINI <0.30 <0.30 <0.30   CBG:  Lab 06/02/12 0359  GLUCAP 96    Recent Results (from the  past 240 hour(s))  URINE CULTURE     Status: Normal   Collection Time   06/01/12  8:50 PM      Component Value Range Status Comment   Specimen Description URINE, CLEAN CATCH   Final    Special Requests NONE   Final    Culture  Setup Time 06/02/2012 03:01   Final    Colony Count NO GROWTH   Final    Culture NO GROWTH   Final    Report Status 06/02/2012 FINAL   Final   MRSA PCR SCREENING     Status: Abnormal   Collection Time   06/02/12 12:56 AM      Component Value Range Status Comment   MRSA by PCR POSITIVE (*) NEGATIVE Final   CLOSTRIDIUM DIFFICILE BY PCR     Status: Normal   Collection Time   06/03/12  6:12 PM      Component Value Range Status Comment   C difficile by pcr NEGATIVE  NEGATIVE Final      Studies: No results found.  Scheduled Meds:    . albuterol  2.5 mg Nebulization Q6H  . antiseptic oral rinse  15 mL Mouth Rinse q12n4p  . bisoprolol  10 mg Oral Daily  . chlorhexidine  15 mL Mouth Rinse BID  . Chlorhexidine Gluconate Cloth  6 each Topical Q0600  . dutasteride  0.5 mg Oral Daily  . feeding supplement  237 mL Oral TID BM  . ipratropium  0.5 mg Nebulization QID  . mirtazapine  15 mg Oral QHS  . mupirocin ointment  1 application Nasal BID  . pantoprazole  40 mg Oral Daily  . saccharomyces boulardii  250 mg Oral BID  . sodium chloride  3 mL Intravenous Q12H  . sucralfate  1 g Oral TID WC & HS  . Warfarin - Pharmacist Dosing Inpatient   Does not apply q1800   Continuous Infusions:    . sodium chloride 50 mL/hr at 06/04/12 1813    Time spent: >30 minutes    Gene Colee  [facility] Pager [phone]. If 8PM-8AM, please contact night-coverage at [email or web address], password [facility] 06/04/2012, 7:33 PM  LOS: 3 days

## 2012-06-05 LAB — BASIC METABOLIC PANEL
BUN: 9 mg/dL (ref 6–23)
CO2: 27 mEq/L (ref 19–32)
CO2: 25 mEq/L (ref 19–32)
Calcium: 8.2 mg/dL — ABNORMAL LOW (ref 8.4–10.5)
Chloride: 108 mEq/L (ref 96–112)
Creatinine, Ser: 0.79 mg/dL (ref 0.50–1.35)
Creatinine, Ser: 0.8 mg/dL (ref 0.50–1.35)
Glucose, Bld: 121 mg/dL — ABNORMAL HIGH (ref 70–99)
Sodium: 139 mEq/L (ref 135–145)

## 2012-06-05 MED ORDER — LEVALBUTEROL HCL 0.63 MG/3ML IN NEBU
0.6300 mg | INHALATION_SOLUTION | Freq: Four times a day (QID) | RESPIRATORY_TRACT | Status: DC
  Administered 2012-06-06: 0.63 mg via RESPIRATORY_TRACT
  Filled 2012-06-05 (×8): qty 3

## 2012-06-05 MED ORDER — K PHOS MONO-SOD PHOS DI & MONO 155-852-130 MG PO TABS
500.0000 mg | ORAL_TABLET | Freq: Every day | ORAL | Status: DC
  Administered 2012-06-05: 500 mg via ORAL
  Filled 2012-06-05: qty 2

## 2012-06-05 MED ORDER — IPRATROPIUM BROMIDE 0.02 % IN SOLN
0.5000 mg | Freq: Four times a day (QID) | RESPIRATORY_TRACT | Status: DC
  Administered 2012-06-05 – 2012-06-06 (×2): 0.5 mg via RESPIRATORY_TRACT
  Filled 2012-06-05 (×4): qty 2.5

## 2012-06-05 MED ORDER — FUROSEMIDE 10 MG/ML IJ SOLN
20.0000 mg | Freq: Once | INTRAMUSCULAR | Status: AC
  Administered 2012-06-05: 20 mg via INTRAVENOUS
  Filled 2012-06-05: qty 2

## 2012-06-05 MED ORDER — LEVALBUTEROL HCL 0.63 MG/3ML IN NEBU
0.6300 mg | INHALATION_SOLUTION | Freq: Four times a day (QID) | RESPIRATORY_TRACT | Status: DC | PRN
  Administered 2012-06-05 (×2): 0.63 mg via RESPIRATORY_TRACT
  Filled 2012-06-05: qty 3

## 2012-06-05 MED ORDER — WARFARIN SODIUM 5 MG PO TABS
5.0000 mg | ORAL_TABLET | Freq: Once | ORAL | Status: AC
  Administered 2012-06-05: 5 mg via ORAL
  Filled 2012-06-05: qty 1

## 2012-06-05 NOTE — Progress Notes (Signed)
[facility] PROGRESS NOTE  [name] ZOX:[id number] DOB: Sep 01, 1932 DOA: 06/01/2012 PCP: Kristian Covey, MD  Assessment/Plan: 1-hypotension/early shock: no signs or source of infection appreciated, changes on CXR/CT chest most likely reflecting recent lung process. So far CE'z negative. Patient BP improved by holding antihypertensive medications and given IVF's. Will continue monitoring closely his VS, change IVF's to maintenance dose; advance diet and follow electrolytes in am. Continue telemetry especially with elevated potassium.   2-COPD: continue nebulizer and inhaler treatment. No wheezing. Mild decreased breath sounds at bases and also lower extremities edema. Treatment as mentioned in problem #12.  3-atrial fibrillation: rate elevated today; bisoprostol dose adjusted to 10mg  daily; coumadin per pharmacy. INR 1.38; electrolytes repletion. Continue telemetry evaluation  4-right side rib fractures: will continue PRN pain meds. HH services to be resumed at discharge  5-GERD/hx of hemorrhagic gastritis: continue PPI and carafate. Coumadin reverse.  Coumadin dose to be managed by pharmacy INR 1.3.  6-anemia: Hgb remains above 9; will follow Hgb trend. Continue iron. No transfusion at this moment.  7-DVT: Continue scd's  8-Deconditioning: Per PT/OT ok to go home with [facility] services. Will resume advance home care at discharge  9-Depression, decrease appetite and insomnia: Continue remeron. Continue ensure TID.  10-Diarrhea: patient was receiving abx's recently. C. Diff negative. Will continue florastor and treat diarrhea symptomatically with lomotil. Improving.  11-Hypokalemia/hypomagnesemia/hypophosphatemia: Repleted. Potassium now 6.0 this morning. No abnormalities on telemetry. EKG has been order. Patient continued to have diarrhea. And he will receive a dose of Lasix for decrease breath sounds at his bases and lower extremity edema. Be made in the morning  12-decreased  breath sounds in lower extremities edema: Patient is 1 L positive at this point. Will change IV fluids to [facility] and gave 20 mg of Lasix IV.  Code Status: Full Family Communication: daughter at bedside Disposition Plan: patient will like to go home when medically stable; will ask PT/OT to evaluate.   Consultants:  none  Procedures:  CXR, CT head and CT chest (see below for reports)  2-D echo w/o wall motion abnormalities, EF 45-50% with mild reduce systolic function.  Antibiotics:  vanc and levaquin 12/30-12/31  HPI/Subjective: Afebrile. Feeling better overall. Throughout the night patient was anxious and experience 2 more episodes of loose stools. In the morning after repletion of his electrolytes potassium is slightly elevated at 6.0 (no abnormalities appreciated on telemetry. Patient also noticed with 1+ edema bilaterally. The patient is 1L positive  Objective: Filed Vitals:   06/04/12 2115 06/05/12 0621 06/05/12 0844 06/05/12 1400  BP: 102/65 130/68  114/66  Pulse: 110 92  86  Temp: 98.8 F (37.1 C) 98.4 F (36.9 C)  98.6 F (37 C)  TempSrc: Oral Oral  Oral  Resp: 20 20  20   Height:      Weight:      SpO2: 96% 95% 95% 94%    Intake/Output Summary (Last 24 hours) at 06/05/12 1719 Last data filed at 06/05/12 1100  Gross per 24 hour  Intake   1400 ml  Output    245 ml  Net   1155 ml   Filed Weights   06/02/12 0030  Weight: 74.4 kg (164 lb 0.4 oz)    Exam:   General:  Feeling better overall; still with some loose stools but they are forming now   Cardiovascular: Regular rate, no rubs or gallops  Respiratory: no wheezing, decreased breath sounds at the bases, mild rhonchi   Abdomen: soft, NT, ND, positive  BS  Extremities: 1+ edema bilaterally  Neuro: no focal deficit.  Data Reviewed: Basic Metabolic Panel:  Lab 06/05/12 1610 06/05/12 0426 06/04/12 0453 06/03/12 0529 06/02/12 0745  NA 139 140 137 136 139  K 6.0* 5.5* 3.1* 3.6 2.9*  CL 108 108 104 101  100  CO2 25 27 28 29  33*  GLUCOSE 121* 94 98 93 86  BUN 10 9 7 8 16   CREATININE 0.80 0.79 0.73 0.82 0.82  CALCIUM 8.2* 8.2* 7.6* 7.6* 7.1*  MG -- 1.5 1.1* -- 1.3*  PHOS -- 1.2* 1.6* -- 1.8*   Liver Function Tests:  Lab 06/01/12 1550  AST 29  ALT 29  ALKPHOS 61  BILITOT 0.5  PROT 5.9*  ALBUMIN 2.2*    Lab 06/01/12 1550  LIPASE 30  AMYLASE --   CBC:  Lab 06/04/12 0453 06/03/12 0529 06/02/12 0745 06/02/12 0100 06/01/12 1550  WBC 4.2 4.5 3.9* -- 6.4  NEUTROABS -- -- -- -- 5.1  HGB 9.5* 9.7* 8.4* 9.5* 10.3*  HCT 29.1* 30.6* 25.3* 28.4* 31.9*  MCV 87.9 89.7 87.2 -- 87.4  PLT 192 172 145* -- 185   Cardiac Enzymes:  Lab 06/02/12 0745 06/02/12 0100 06/01/12 1550  CKTOTAL -- -- --  CKMB -- -- --  CKMBINDEX -- -- --  TROPONINI <0.30 <0.30 <0.30   CBG:  Lab 06/02/12 0359  GLUCAP 96    Recent Results (from the past 240 hour(s))  URINE CULTURE     Status: Normal   Collection Time   06/01/12  8:50 PM      Component Value Range Status Comment   Specimen Description URINE, CLEAN CATCH   Final    Special Requests NONE   Final    Culture  Setup Time 06/02/2012 03:01   Final    Colony Count NO GROWTH   Final    Culture NO GROWTH   Final    Report Status 06/02/2012 FINAL   Final   MRSA PCR SCREENING     Status: Abnormal   Collection Time   06/02/12 12:56 AM      Component Value Range Status Comment   MRSA by PCR POSITIVE (*) NEGATIVE Final   CLOSTRIDIUM DIFFICILE BY PCR     Status: Normal   Collection Time   06/03/12  6:12 PM      Component Value Range Status Comment   C difficile by pcr NEGATIVE  NEGATIVE Final      Studies: No results found.  Scheduled Meds:    . antiseptic oral rinse  15 mL Mouth Rinse q12n4p  . bisoprolol  10 mg Oral Daily  . chlorhexidine  15 mL Mouth Rinse BID  . Chlorhexidine Gluconate Cloth  6 each Topical Q0600  . dutasteride  0.5 mg Oral Daily  . feeding supplement  237 mL Oral TID BM  . furosemide  20 mg Intravenous Once  .  ipratropium  0.5 mg Nebulization QID  . levalbuterol  0.63 mg Nebulization QID  . mirtazapine  15 mg Oral QHS  . mupirocin ointment  1 application Nasal BID  . pantoprazole  40 mg Oral Daily  . saccharomyces boulardii  250 mg Oral BID  . sodium chloride  3 mL Intravenous Q12H  . sucralfate  1 g Oral TID WC & HS  . Warfarin - Pharmacist Dosing Inpatient   Does not apply q1800   Continuous Infusions:    . sodium chloride 50 mL/hr at 06/05/12 1525    Time spent: >  30 minutes   Varun Jourdan  [facility] Pager [phone]. If 8PM-8AM, please contact night-coverage at [email or web address], password [facility] 06/05/2012, 5:19 PM  LOS: 4 days

## 2012-06-05 NOTE — Progress Notes (Signed)
ANTICOAGULATION CONSULT NOTE - Follow Up Consult  Pharmacy Consult for Coumadin Indication: Atrial Fibrillation  Allergies  Allergen Reactions  . Penicillins     hives    Patient Measurements: Height: 5\' 11"  (180.3 cm) Weight: 164 lb 0.4 oz (74.4 kg) IBW/kg (Calculated) : 75.3  Heparin Dosing Weight:   Vital Signs: Temp: 98.4 F (36.9 C) (01/03 0621) Temp src: Oral (01/03 0621) BP: 130/68 mmHg (01/03 0621) Pulse Rate: 92  (01/03 0621)  Labs:  Basename 06/05/12 0426 06/04/12 0453 06/03/12 0529 06/02/12 1705  HGB -- 9.5* 9.7* --  HCT -- 29.1* 30.6* --  PLT -- 192 172 --  APTT -- -- -- --  LABPROT 16.0* 16.6* -- 16.7*  INR 1.31 1.38 -- 1.39  HEPARINUNFRC -- -- -- --  CREATININE 0.79 0.73 0.82 --  CKTOTAL -- -- -- --  CKMB -- -- -- --  TROPONINI -- -- -- --    Estimated Creatinine Clearance: 81.4 ml/min (by C-G formula based on Cr of 0.79).   Medications:  Home dose Coumadin:  2.5mg  daily except 5mg  on Monday.   Assessment: 79 yoM on chronic coumadin for Afib admitted with supratherapeutic INR, now s/p Vit K IV administration.  INR remains subtherapeutic, actually decreased overnight.  No new CBC, but Hgb, plts have been ok.  No bleed noted.  IV levaquin was d/c'd 12/31.  Renal fxn stable.    Goal of Therapy:  INR 2-3 Monitor platelets by anticoagulation protocol: Yes   Plan:  1.  Coumadin 5 mg po x 1 tonight.  2.  F/u daily PT/INR, CBC.     Khianna Blazina E 06/05/2012,8:17 AM

## 2012-06-06 LAB — BASIC METABOLIC PANEL
BUN: 10 mg/dL (ref 6–23)
Calcium: 8.2 mg/dL — ABNORMAL LOW (ref 8.4–10.5)
GFR calc Af Amer: >90 mL/min (ref >90)
GFR calc non Af Amer: 80 mL/min — ABNORMAL LOW (ref >90)
Glucose, Bld: 95 mg/dL (ref 70–99)

## 2012-06-06 LAB — PROTIME-INR: INR: 1.42 (ref 0.00–1.49)

## 2012-06-06 MED ORDER — ENSURE COMPLETE PO LIQD: 237.0000 mL | Freq: Three times a day (TID) | ORAL | Status: DC

## 2012-06-06 MED ORDER — SACCHAROMYCES BOULARDII 250 MG PO CAPS: 250.0000 mg | ORAL_CAPSULE | Freq: Two times a day (BID) | ORAL | Status: DC

## 2012-06-06 MED ORDER — TRAMADOL HCL 50 MG PO TABS: 50.0000 mg | ORAL_TABLET | Freq: Three times a day (TID) | ORAL | Status: DC | PRN

## 2012-06-06 MED ORDER — BISOPROLOL FUMARATE 5 MG PO TABS: 10.0000 mg | ORAL_TABLET | Freq: Every day | ORAL | Status: DC

## 2012-06-06 MED ORDER — ACETAMINOPHEN 500 MG PO TABS: 500.0000 mg | ORAL_TABLET | Freq: Four times a day (QID) | ORAL | Status: DC | PRN

## 2012-06-06 MED ORDER — DIPHENOXYLATE-ATROPINE 2.5-0.025 MG PO TABS: 1.0000 | ORAL_TABLET | Freq: Four times a day (QID) | ORAL | Status: DC | PRN

## 2012-06-06 MED ORDER — POTASSIUM PHOSPHATE MONOBASIC 500 MG PO TABS: 500.0000 mg | ORAL_TABLET | Freq: Every day | ORAL | Status: DC

## 2012-06-06 NOTE — Discharge Summary (Signed)
Physician Discharge Summary  [name] BJY:[id number] DOB: [birth year] DOA: 06/01/2012  PCP: Kristian Covey, MD  Admit date: 06/01/2012 Discharge date: 06/06/2012  Time spent: >30 minutes  Recommendations for Outpatient Follow-up:  -Repeat BMET/MG and Phosphorus level to follow renal function and electrolytes. -Reevaluate BP and readjust medications as needed -CBC to follow Hgb  Discharge Diagnoses:  1-Hypotension 2-COPD 3-Atrial Fibrillation 4-GERD 5-Right side rib fractures 6-Anemia 7-Depression/insomnia 8-HLD 9-Physical deconditioning 10-Functional diarrhea 11-Hypokalemia 12-hypomagnesemia  13-Hypophosphatemia  Discharge Condition: stable. Patient will go home with [facility] services and family care 24/7. Advised to keep himself hydrated, to take medications as prescribed and to follow discharge instructions.  Diet recommendation: heart healthy diet; Ensure TID  Filed Weights   06/02/12 0030  Weight: 74.4 kg (164 lb 0.4 oz)    History of present illness:  79 y.o. male, past medical history significant for atrial fibrillation on Coumadin, with history of gastric ulcers partial small bowel resection in the past presenting today with a few days history of weakness and a fall, with right-sided chest pain. Patient reports black tarry stools but he is on iron. No history of bloody stools, abdominal or epigastric pain, nausea vomiting or recent diarrhea. In the emergency room he was hypotensive, slightly confused. Denies any fevers chills, loss of consciousness.   Hospital Course:  1-hypotension/early shock: no signs or source of infection appreciated, changes on CXR/CT chest most likely reflecting recent lung process. So far CE'z negative. Patient BP improved by holding antihypertensive medications and given IVF's. At discharge no orthostatic changes and VS were stable. Patient will follow with PCP for further medication adjustments. (HCTZ discontinue; rest of antihypertensive  medications restarted and dose adjusted prior to discharge)  2-COPD: continue nebulizer and inhaler treatment. No wheezing. Mild decreased breath sounds at bases and also lower extremities edema. Treatment as mentioned in problem #12.   3-atrial fibrillation: Continue bisoprostol dose adjusted to 10mg  daily for better rate control; continue coumadin.evaluation   4-right side rib fractures: will continue PRN pain meds. HH services to be resumed at discharge   5-GERD/hx of hemorrhagic gastritis: continue PPI.   6-anemia: Hgb remains above 9; will follow Hgb during visit with PCP. Continue iron. No transfusion needed during hospitalization.   7-HLD: continue statins  8-Deconditioning: Per PT/OT ok to go home with [facility] services. Will resume advance home care at discharge   9-Depression, decrease appetite and insomnia: Continue remeron. Started on ensure TID; advise of importance on nutrition. Family will encourage him to eat.   10-Diarrhea: patient was receiving abx's recently. C. Diff negative. Will continue florastor and treat diarrhea symptomatically with lomotil. Improving and almost resolved at discharge.   11-Hypokalemia/hypomagnesemia/hypophosphatemia: Repleted. Phosphorus still a little low at discharge. Will take 500mg  Kphos daily for 5 days. No abnormalities on EKG or telemetry at discharge. Follow up with BMET and Phosphorus/Mg level during visit with PCP.  12-Decreased breath sounds at bases and lower extremities edema: Patient is now with good air movement, no CP, no SOB. And just trace edema bilaterally. Has been restarted on lasix 20mg  daily. During follow up with PCP diuretic can be readjusted as needed.  13-BPH: continue home medications.no urinary retention.  Rest of medical problems remains stable and at this point plan is to continue current medication regimen.  Procedures: CXR, CT head and CT chest (see below for reports)  2-D echo w/o wall motion abnormalities, EF 45-50%  with mild reduce systolic function.  Consultations:  PT/OT  Discharge Exam: Filed Vitals:  06/05/12 1400 06/05/12 2132 06/06/12 0423 06/06/12 0807  BP: 114/66 133/73 138/74   Pulse: 86 108 108   Temp: 98.6 F (37 C) 99.1 F (37.3 C) 97 F (36.1 C)   TempSrc: Oral Oral Oral   Resp: 20 20 20    Height:      Weight:      SpO2: 94% 97% 95% 91%    General: Feeling better overall; minimal loose stools and no frequent anymore; still feeling weak.  Cardiovascular: mild tachycardia (108), no rubs or gallops; S1 and S2 appreciated Respiratory: no wheezing, good air movement, scattered rhonchi  Abdomen: soft, NT, ND, positive BS  Extremities: trace edema bilaterally  Neuro: no new focal deficit.  Discharge Instructions  Discharge Orders    Future Appointments: Provider: Department: Dept Phone: Center:   06/12/2012 9:00 AM Julio Sicks, NP [facility] [phone] None   06/12/2012 11:00 AM Kristian Covey, MD [facility] [place] [phone] [facility]   06/17/2012 12:00 PM Kristian Covey, MD [facility] [place] [phone] [facility]   08/19/2012 1:30 PM Kristian Covey, MD  [facility] [phone] [facility]     Future Orders Please Complete By Expires   Diet - low sodium heart healthy      Increase activity slowly      Discharge instructions      Comments:   -Take medications as prescribed -Keep yourself well hydrated and increase PO intake -Follow rec's and instructions from Home Health staff services -Arrange follow up with PCP in 1 week Keep your appointment   Home Health      Scheduling Instructions:   Resume [facility] services.   Questions: Responses:   To provide the following care/treatments PT    OT       Medication List     As of 06/06/2012  9:35 AM    STOP taking these medications         guaiFENesin 600 MG 12 hr tablet   Commonly known as: MUCINEX      hydrochlorothiazide 25 MG tablet    Commonly known as: HYDRODIURIL      TAKE these medications         acetaminophen 500 MG tablet   Commonly known as: TYLENOL   Take 1 tablet (500 mg total) by mouth every 6 (six) hours as needed for pain (or Fever >/= 101).      bisoprolol 5 MG tablet   Commonly known as: ZEBETA   Take 2 tablets (10 mg total) by mouth daily.      diphenoxylate-atropine 2.5-0.025 MG per tablet   Commonly known as: LOMOTIL   Take 1-2 tablets by mouth 4 (four) times daily as needed for diarrhea or loose stools.      doxazosin 4 MG tablet   Commonly known as: CARDURA   Take 4 mg by mouth daily.      dutasteride 0.5 MG capsule   Commonly known as: AVODART   Take 0.5 mg by mouth daily.      feeding supplement Liqd   Take 237 mLs by mouth 3 (three) times daily between meals.      ferrous sulfate 325 (65 FE) MG tablet   Take 1 tablet (325 mg total) by mouth 2 (two) times daily with a meal.      furosemide 20 MG tablet   Commonly known as: LASIX   Take 1 tablet (20 mg total) by mouth daily.      gabapentin 600 MG  tablet   Commonly known as: NEURONTIN   Take 1 tablet (600 mg total) by mouth 3 (three) times daily.      ipratropium 0.02 % nebulizer solution   Commonly known as: ATROVENT   Take 2.5 mLs (0.5 mg total) by nebulization 4 (four) times daily.      levalbuterol 0.63 MG/3ML nebulizer solution   Commonly known as: XOPENEX   Take 3 mLs (0.63 mg total) by nebulization 4 (four) times daily.      mirtazapine 15 MG tablet   Commonly known as: REMERON   Take 15 mg by mouth at bedtime.      pantoprazole 40 MG tablet   Commonly known as: PROTONIX   Take 40 mg by mouth daily.      potassium phosphate (monobasic) 500 MG tablet   Commonly known as: K-PHOS ORIGINAL   Take 1 tablet (500 mg total) by mouth daily.      saccharomyces boulardii 250 MG capsule   Commonly known as: FLORASTOR   Take 1 capsule (250 mg total) by mouth 2 (two) times daily. For a week.      simvastatin 20 MG tablet     Commonly known as: ZOCOR   Take 20 mg by mouth at bedtime.      traMADol 50 MG tablet   Commonly known as: ULTRAM   Take 1 tablet (50 mg total) by mouth every 8 (eight) hours as needed for pain.      warfarin 5 MG tablet   Commonly known as: COUMADIN   Take 2.5-5 mg by mouth daily. Pt takes 2.5 mg every day except on Monday pt takes 5 mg            Follow-up Information    Follow up with Kristian Covey, MD. Schedule an appointment as soon as possible for a visit in 1 week.   Contact information:   [address] [place] [address] [phone]           The results of significant diagnostics from this hospitalization (including imaging, microbiology, ancillary and laboratory) are listed below for reference.    Significant Diagnostic Studies: Dg Ribs Unilateral W/chest Right  06/01/2012  *RADIOLOGY REPORT*  Clinical Data: Weakness and hypotension.  Right-sided pain. Shortness of breath.  RIGHT RIBS AND CHEST - 3+ VIEW  Comparison: Chest x-ray 05/09/2012.  Findings: Multiple dedicated views of the right ribs demonstrate no definite acute displaced rib fractures.  The lung volumes remain low.  There continues to be extensive left sided retrocardiac opacity concerning for airspace consolidation.  Small - moderate left pleural effusion is similar to the prior examination. Bilateral hilar fullness is again noted, and is somewhat masslike, particularly on the right.  Mild pulmonary venous congestion without frank pulmonary edema.  Mild cardiomegaly is unchanged. Atherosclerosis in the thoracic aorta.  Status post median sternotomy.  IMPRESSION: 1.  No definite acute displaced right-sided rib fractures. 2.  Persistent left lower lobe consolidation concerning for pneumonia with small - moderate left parapneumonic pleural effusion. 3.  Persistent mass-like enlargement of the hilar regions bilaterally, particularly on the right.  Underlying malignancy or lymphadenopathy is not  excluded.  Further evaluation with contrast enhanced CT of the thorax is recommended to better characterize these findings and to better evaluate the process in the left lower lobe.  The given the patient's symptoms, a PE protocol CT scan would be optimal, as this would also be able to exclude underlying pulmonary embolism.   Original Report  Authenticated By: Trudie Reed, M.D.    Ct Head Wo Contrast  06/01/2012  *RADIOLOGY REPORT*  Clinical Data: 79 year old male fall.  Super therapeutic INR.  CT HEAD WITHOUT CONTRAST  Technique:  Contiguous axial images were obtained from the base of the skull through the vertex without contrast.  Comparison: [facility] Head CT 11/04/2008.  Findings: Trace fluid level in the left sphenoid sinus appears to be inflammatory.  Other Visualized paranasal sinuses and mastoids are clear.  Intermittent mild motion artifact.  Postoperative changes to the orbits, stable.  No scalp hematoma. No acute osseous abnormality identified.  Mild Calcified atherosclerosis at the skull base.  Stable cerebral volume.  No ventriculomegaly. No midline shift, mass effect, or evidence of mass lesion.  No acute intracranial hemorrhage identified.  Patchy confluent white matter hypodensity. No evidence of cortically based acute infarction identified.  No suspicious intracranial vascular hyperdensity.  IMPRESSION: Stable volume loss and nonspecific white matter changes. No acute intracranial abnormality.   Original Report Authenticated By: Erskine Speed, M.D.    Ct Chest W Contrast  06/01/2012  *RADIOLOGY REPORT*  Clinical Data: Follow up abnormal chest x-ray  CT CHEST WITH CONTRAST  Technique:  Multidetector CT imaging of the chest was performed following the standard protocol during bolus administration of intravenous contrast.  Contrast: OMNIPAQUE IOHEXOL 300 MG/ML  SOLN  Comparison: Bilateral rib radiographs 06/01/2012; prior chest x-ray 05/09/2012  Findings:  Mediastinum:  Unremarkable CT appearance of the thyroid gland.  No suspicious mediastinal or hilar adenopathy.  Large sliding hiatal hernia with intrathoracic stomach.  Only the distal antrum lies below the diaphragm.  Heart/Vascular: Marked enlargement of the main and central pulmonary arteries consistent with underlying pulmonary arterial hypertension.  No filling defect to suggest pulmonary embolus.  The heart is enlarged with left greater than right by atrial enlargement.  Atherosclerotic vascular calcifications noted in the coronary arteries.  Status post median sternotomy with evidence of prior right internal mammary artery bypass.  No pericardial effusion.  Scattered atherosclerotic calcifications throughout the thoracic aorta without aneurysmal dilatation.  Four vessel arch. Left vertebral artery arises directly from the aorta.  Lungs/Pleura: Advance centrilobular emphysema. Bibasilar and nodular consolidation versus pleural parenchymal scarring. Additionally, there is peribronchovascular macro nodularity in the left upper lobe peripherally.  Upper Abdomen: Intrathoracic stomach as above.  Surgical changes of prior cholecystectomy.  Otherwise, visualized upper abdominal contents unremarkable.  Bones: Acute nondisplaced fractures of the anterolateral aspect of right ribs six and seven.  IMPRESSION:  1. Acute nondisplaced fractures of the anterolateral aspect of right ribs six and seven 2.  Peribronchovascular macro nodularity in the periphery of the left upper lobe consistent with an underlying infectious/inflammatory process.  3.  Nodular consolidation versus pleural parenchymal scarring with associated volume loss in the bilateral lower lobes.  These findings are favored to reflect the sequela of chronic aspiration given the patient's large hiatal hernia.  Recommend follow-up chest CT in 3 months to assess stability.  4.  Massive pulmonary arterial enlargement and likely associated bi atrial enlargement is highly  suggestive of pulmonary arterial hypertension.  This accounts for the appearance of a masslike hilar enlargement on the recent chest radiographs.  5.  Advanced centrilobular emphysema.  6.  Atherosclerosis including coronary artery disease.  7. Large sliding hiatal hernia with near total intrathoracic stomach.   Original Report Authenticated By: Malachy Moan, M.D.    Dg Chest Port 1 View  05/09/2012  *RADIOLOGY REPORT*  Clinical Data: Edema, cough.  PORTABLE CHEST - 1 VIEW  Comparison: 05/07/2012  Findings: Prior median sternotomy.  Cardiomegaly.  Left lower lobe airspace opacity again noted.  On today's study, this appears more concerning for consolidation/pneumonia.  Small left effusion.  Bilateral hilar prominence, right greater than left.  Cannot exclude adenopathy.  Left upper lobe atelectasis and right basilar atelectasis. No acute bony abnormality.  IMPRESSION: The left retrocardiac density is concerning for pneumonia.  Small left effusion.  Left upper lobe and right basilar atelectasis.  Bilateral hilar fullness, right greater than left.  Cannot exclude adenopathy.   Original Report Authenticated By: Charlett Nose, M.D.     Microbiology: Recent Results (from the past 240 hour(s))  URINE CULTURE     Status: Normal   Collection Time   06/01/12  8:50 PM      Component Value Range Status Comment   Specimen Description URINE, CLEAN CATCH   Final    Special Requests NONE   Final    Culture  Setup Time 06/02/2012 03:01   Final    Colony Count NO GROWTH   Final    Culture NO GROWTH   Final    Report Status 06/02/2012 FINAL   Final   MRSA PCR SCREENING     Status: Abnormal   Collection Time   06/02/12 12:56 AM      Component Value Range Status Comment   MRSA by PCR POSITIVE (*) NEGATIVE Final   CLOSTRIDIUM DIFFICILE BY PCR     Status: Normal   Collection Time   06/03/12  6:12 PM      Component Value Range Status Comment   C difficile by pcr NEGATIVE  NEGATIVE Final      Labs: Basic  Metabolic Panel:  Lab 06/06/12 6962 06/05/12 0920 06/05/12 0426 06/04/12 0453 06/03/12 0529 06/02/12 0745  NA 138 139 140 137 136 --  K 4.4 6.0* 5.5* 3.1* 3.6 --  CL 103 108 108 104 101 --  CO2 29 25 27 28 29  --  GLUCOSE 95 121* 94 98 93 --  BUN 10 10 9 7 8  --  CREATININE 0.89 0.80 0.79 0.73 0.82 --  CALCIUM 8.2* 8.2* 8.2* 7.6* 7.6* --  MG -- -- 1.5 1.1* -- 1.3*  PHOS -- -- 1.2* 1.6* -- 1.8*   Liver Function Tests:  Lab 06/01/12 1550  AST 29  ALT 29  ALKPHOS 61  BILITOT 0.5  PROT 5.9*  ALBUMIN 2.2*    Lab 06/01/12 1550  LIPASE 30  AMYLASE --   CBC:  Lab 06/04/12 0453 06/03/12 0529 06/02/12 0745 06/02/12 0100 06/01/12 1550  WBC 4.2 4.5 3.9* -- 6.4  NEUTROABS -- -- -- -- 5.1  HGB 9.5* 9.7* 8.4* 9.5* 10.3*  HCT 29.1* 30.6* 25.3* 28.4* 31.9*  MCV 87.9 89.7 87.2 -- 87.4  PLT 192 172 145* -- 185   Cardiac Enzymes:  Lab 06/02/12 0745 06/02/12 0100 06/01/12 1550  CKTOTAL -- -- --  CKMB -- -- --  CKMBINDEX -- -- --  TROPONINI <0.30 <0.30 <0.30   CBG:  Lab 06/02/12 0359  GLUCAP 96     Signed:  Cari Vandeberg  [facility] 06/06/2012, 9:07 AM

## 2012-06-06 NOTE — Progress Notes (Signed)
Cm spoke with patient concerning dc planning. Pt active with [facility] previously before admittance. [facility] per pt choice to continue home care. Pt's wife at bedside to assist with home care. No other needs requested. Pt states having DME 7 home oxygen available at residence.   Smitty Ackerley,Rn,BSN

## 2012-06-12 ENCOUNTER — Ambulatory Visit: Payer: Medicare Other | Admitting: Family

## 2012-06-12 ENCOUNTER — Encounter: Payer: Self-pay | Admitting: Family Medicine

## 2012-06-12 ENCOUNTER — Ambulatory Visit (INDEPENDENT_AMBULATORY_CARE_PROVIDER_SITE_OTHER): Payer: Medicare Other | Admitting: Family Medicine

## 2012-06-12 ENCOUNTER — Telehealth: Payer: Self-pay | Admitting: Family Medicine

## 2012-06-12 ENCOUNTER — Ambulatory Visit (INDEPENDENT_AMBULATORY_CARE_PROVIDER_SITE_OTHER)
Admission: RE | Admit: 2012-06-12 | Discharge: 2012-06-12 | Disposition: A | Discharge status: Home / Self Care | Payer: Medicare Other | Source: Ambulatory Visit | Attending: Internal Medicine | Admitting: Internal Medicine

## 2012-06-12 ENCOUNTER — Encounter: Payer: Medicare Other | Admitting: Adult Health

## 2012-06-12 VITALS — BP 94/60 | HR 87 | Temp 97.5°F | Wt 166.0 lb

## 2012-06-12 DIAGNOSIS — S2231XA Fracture of one rib, right side, initial encounter for closed fracture: Secondary | ICD-10-CM

## 2012-06-12 DIAGNOSIS — J449 Chronic obstructive pulmonary disease, unspecified: Secondary | ICD-10-CM

## 2012-06-12 DIAGNOSIS — L988 Other specified disorders of the skin and subcutaneous tissue: Secondary | ICD-10-CM

## 2012-06-12 DIAGNOSIS — R5381 Other malaise: Secondary | ICD-10-CM

## 2012-06-12 DIAGNOSIS — R0602 Shortness of breath: Secondary | ICD-10-CM

## 2012-06-12 DIAGNOSIS — R238 Other skin changes: Secondary | ICD-10-CM

## 2012-06-12 DIAGNOSIS — D62 Acute posthemorrhagic anemia: Secondary | ICD-10-CM

## 2012-06-12 DIAGNOSIS — E876 Hypokalemia: Secondary | ICD-10-CM

## 2012-06-12 DIAGNOSIS — S2239XA Fracture of one rib, unspecified side, initial encounter for closed fracture: Secondary | ICD-10-CM

## 2012-06-12 DIAGNOSIS — I10 Essential (primary) hypertension: Secondary | ICD-10-CM

## 2012-06-12 DIAGNOSIS — I4891 Unspecified atrial fibrillation: Secondary | ICD-10-CM

## 2012-06-12 DIAGNOSIS — R600 Localized edema: Secondary | ICD-10-CM

## 2012-06-12 DIAGNOSIS — I959 Hypotension, unspecified: Secondary | ICD-10-CM

## 2012-06-12 DIAGNOSIS — R609 Edema, unspecified: Secondary | ICD-10-CM

## 2012-06-12 DIAGNOSIS — Z8673 Personal history of transient ischemic attack (TIA), and cerebral infarction without residual deficits: Secondary | ICD-10-CM

## 2012-06-12 LAB — CBC WITH DIFFERENTIAL/PLATELET
Basophils Relative: 0.4 % (ref 0.0–3.0)
Eosinophils Absolute: 0.1 10*3/uL (ref 0.0–0.7)
Hemoglobin: 11.2 g/dL — ABNORMAL LOW (ref 13.0–17.0)
MCHC: 32.5 g/dL (ref 30.0–36.0)
MCV: 87.3 fl (ref 78.0–100.0)
Monocytes Absolute: 0.6 10*3/uL (ref 0.1–1.0)
Neutro Abs: 7.3 10*3/uL (ref 1.4–7.7)
RBC: 3.96 Mil/uL — ABNORMAL LOW (ref 4.22–5.81)

## 2012-06-12 LAB — BASIC METABOLIC PANEL
CO2: 29 mEq/L (ref 19–32)
Chloride: 102 mEq/L (ref 96–112)
Creatinine, Ser: 1 mg/dL (ref 0.4–1.5)
Glucose, Bld: 110 mg/dL — ABNORMAL HIGH (ref 70–99)
Sodium: 140 mEq/L (ref 135–145)

## 2012-06-12 NOTE — Progress Notes (Signed)
Quick Note:  Spoke with pt and notified of results per Dr. Wert. Pt verbalized understanding and denied any questions.  ______ 

## 2012-06-12 NOTE — Patient Instructions (Signed)
Continue 5mg  on Monday and 2.5mg  all other days. Recheck in 4 weeks    Latest dosing instructions   Total Sun Mon Tue Wed Thu Fri Sat   20 2.5 mg 5 mg 2.5 mg 2.5 mg 2.5 mg 2.5 mg 2.5 mg    (2.5 mg1) (2.5 mg2) (2.5 mg1) (2.5 mg1) (2.5 mg1) (2.5 mg1) (2.5 mg1)

## 2012-06-12 NOTE — Telephone Encounter (Signed)
OK to order 

## 2012-06-12 NOTE — Telephone Encounter (Signed)
Nurse with [facility] requesting order for occupational therapy.

## 2012-06-12 NOTE — Telephone Encounter (Signed)
Order sent.

## 2012-06-12 NOTE — Patient Instructions (Signed)
Discontinue doxazosin Elevate legs frequently. Increased furosemide 20 mg to 1 twice daily Keep followup with pulmonology

## 2012-06-12 NOTE — Progress Notes (Signed)
Subjective:    Patient ID: [name], male    DOB: 03-24-1933, 79 y.o.   MRN: [id number]  HPI Hospital followup. Patient has multiple chronic problems including history of CAD, cerebrovascular disease, hypertension, atrial fibrillation, hyperlipidemia, COPD, GERD, history of gastric ulcer. He was admitted on 06/01/2012 and discharged on 06/06/2012. Patient had presented here with hypotension, severe profound weakness and dehydration. He was admitted and had the following issues addressed  Hypotension. No evidence for acute infection.  HCTZ was discontinued. Patient's blood pressure improved with IV fluids. He remained somewhat dizzy with position change at this time but no syncope.  Atrial fibrillation. Increased ventricular response. Bisoprolol increased to 10 mg daily.  Patient had recent fall and CT scan confirmed right-sided rib fractures. Patient has history of chronic anemia and hemoglobin remained above 9. No transfusion given. No evidence for active bleeding.  Patient has severe deconditioning. Home OT and PT in place.  History of depression. Very poor appetite. Remains on Remeron which has not seemed to help much. Sleep is fair. He has refused Ensure  Patient had some diarrhea on admission. C. difficile negative. Recent antibiotics given. Treated with florastor. Diarrhea improving at this time. He had several electrolyte abnormalities with hypokalemia, hypomagnesemia, and hypophosphatemia. Was given K-Phos at discharge for 5 days. Needs followup electrolytes.  Chronic lower extremity edema. Started back furosemide 20 mg daily. Not elevating legs consistently.  History of BPH on Avodart. Also takes doxazosin. Very high risk orthostasis.  Multiple studies including echocardiogram ejection fraction 45-50%. Chest x-ray showed persistent left lower lobe consolidation and persistent enlargement hilar regions. CT head no acute abnormality. CT chest with contrast revealed acute  nondisplaced fractures right ribs 6 and 7. Massive pulmonary artery enlargement and likely associated.atrial enlargement suggestive of pulmonary artery hypertension. This it felt accounted for appearance of masslike hilar enlargement on chest x-ray. Advanced centrilobular emphysema  Patient fell after hospital discharge with large abrasion right upper inner arm. Cleaning and changing dressing daily. No fevers or chills. Also has a couple large bullous type lesions right leg and left leg.  Past Medical History  Diagnosis Date  . History of upper gastrointestinal hemorrhage   . Cerebrovascular accident   . Gastric polyp   . Dysphagia, unspecified   . Coronary artery disease     nonobstructive by cath 2009  . Permanent atrial fibrillation   . Hypertension   . Esophagitis   . GERD (gastroesophageal reflux disease)   . Erosive gastritis   . FH: colonic polyps   . Gastric ulcer   . Hiatal hernia   . Internal hemorrhoids    Past Surgical History  Procedure Date  . Cholecystectomy   . Asd repair [id number]  . Inguinal hernia repair 1963  . Partial small bowel resection 2005    ischemic?  [name] Enteroscopy 05/13/2012    Procedure: ENTEROSCOPY;  Surgeon: Louis Meckel, MD;  Location: [facility];  Service: Endoscopy;  Laterality: N/A;    reports that he quit smoking about 29 years ago. His smoking use included Cigarettes. He has a 20 pack-year smoking history. He has never used smokeless tobacco. He reports that he does not drink alcohol or use illicit drugs. family history is not on file. Allergies  Allergen Reactions  . Penicillins     hives      Review of Systems  Constitutional: Positive for appetite change (loss) and fatigue. Negative for fever and chills.  HENT: Negative for congestion, sore throat and trouble swallowing.  Eyes: Negative for visual disturbance.  Respiratory: Negative for cough and wheezing.   Cardiovascular: Positive for leg swelling. Negative for chest pain and  palpitations.  Gastrointestinal: Negative for nausea, vomiting, abdominal pain and blood in stool.  Genitourinary: Negative for dysuria and decreased urine volume.  Skin: Positive for wound.  Neurological: Positive for dizziness and weakness. Negative for seizures, syncope and headaches.  Hematological: Negative for adenopathy. Bruises/bleeds easily.  Psychiatric/Behavioral: Positive for dysphoric mood. Negative for confusion.       Objective:   Physical Exam  Constitutional:       Alert cooperative somewhat cachectic appearing gentleman  HENT:  Mouth/Throat: Oropharynx is clear and moist.  Neck: Neck supple.  Cardiovascular: Normal rate.   Pulmonary/Chest: Effort normal and breath sounds normal. No respiratory distress. He has no wheezes. He has no rales.  Abdominal: Soft. There is no tenderness.  Musculoskeletal: He exhibits edema.       Patient has 1-2+ pitting edema lower legs bilaterally  Lymphadenopathy:    He has no cervical adenopathy.  Neurological: He is alert. No cranial nerve deficit.  Skin:       Large abrasion right upper/inner arm. No cellulitis changes. He has very large bullous type lesion which has burst right anterior leg. No cellulitis changes. Good granulation tissue. He has smaller bullous lesion left anterior leg about 3 cm diameter which is not ruptured at this point          Assessment & Plan:  #1 generalized weakness/physical deconditioning. Discussed with family possible rehabilitation as they're having difficulty managing at home and they're looking into this option #2 history of electrolyte abnormalities as above. Check basic metabolic panel, phosphorus, and magnesium  #3 low blood pressure. HCTZ recently discontinued. Try holding doxazosin and followup promptly for any urinary retention issues. #4 atrial fibrillation rate controlled with bisoprolol 10 mg daily. Recheck INR with patient chronic Coumadin #5 recent right-sided rib fractures- pain well  controlled currently  #6 chronic anemia. Recheck CBC  #7 history depression. Questionable benefit from Remeron. Hopefully helping with sleep. Not much improvement with appetite. #8 diarrhea which is improving. Possibly related to recent antibiotics. C. difficile negative #9 history of chronic COPD. Continue close followup with pulmonary  #10 peripheral edema. Probably exacerbated by low albumin/inactivity. Increase furosemide 20 mg twice daily. Elevate legs more frequently

## 2012-06-15 ENCOUNTER — Telehealth: Payer: Self-pay | Admitting: Family Medicine

## 2012-06-15 MED ORDER — SILVER SULFADIAZINE 1 % EX CREA: TOPICAL_CREAM | Freq: Two times a day (BID) | CUTANEOUS | Status: DC

## 2012-06-15 MED ORDER — POTASSIUM PHOSPHATE MONOBASIC 500 MG PO TABS: 500.0000 mg | ORAL_TABLET | Freq: Every day | ORAL | Status: DC

## 2012-06-15 MED ORDER — MEGESTROL ACETATE 40 MG/ML PO SUSP: ORAL | Status: DC

## 2012-06-15 NOTE — Telephone Encounter (Signed)
Pt's daughter would like to know if you could prescribe MEGACE (recommeded by home health nurse) for appetite.  Pt also needs silvadine cream for the wounds on pt's leg and arms. Pharm: [facility]

## 2012-06-15 NOTE — Telephone Encounter (Signed)
Doxazosin was D/C and the lasix

## 2012-06-15 NOTE — Telephone Encounter (Signed)
Lasix 20 mg was increased to BID

## 2012-06-15 NOTE — Addendum Note (Signed)
Addended by: Melchor Amour on: 06/15/2012 05:40 PM   Modules accepted: Orders

## 2012-06-15 NOTE — Telephone Encounter (Signed)
Pt is out of potasium and needs a refill sent to [facility] [phone]).  Pt was not sure if this med was d/c or not at last ov.  Please advise.

## 2012-06-15 NOTE — Telephone Encounter (Signed)
rx for Silvadene cream to use BID Megace 40mg /ml one tsp po bid

## 2012-06-16 ENCOUNTER — Telehealth: Payer: Self-pay | Admitting: Family Medicine

## 2012-06-16 NOTE — Telephone Encounter (Signed)
[name] informed on personally identified VM, also asked her to repeat INR on 1/17 and call or fax result.

## 2012-06-16 NOTE — Telephone Encounter (Signed)
Xeroform OK.  Repeat INR one week.

## 2012-06-16 NOTE — Progress Notes (Signed)
Quick Note:  Pt wife informed ______ 

## 2012-06-16 NOTE — Telephone Encounter (Signed)
INR on 1/10 was 2.8.  When should PT/INR be done again? Please advise on xeroform

## 2012-06-16 NOTE — Telephone Encounter (Signed)
Debbie from [facility] would like to know if we did protime on patient friday 06-13-2011. Pt has wounds on legs/arm. Eunice Blase would like to know if she could use xeroform on wounds

## 2012-06-17 ENCOUNTER — Ambulatory Visit: Payer: BC Managed Care – PPO | Admitting: Family Medicine

## 2012-06-19 ENCOUNTER — Telehealth: Payer: Self-pay | Admitting: Internal Medicine

## 2012-06-19 ENCOUNTER — Ambulatory Visit (INDEPENDENT_AMBULATORY_CARE_PROVIDER_SITE_OTHER): Payer: Self-pay | Admitting: Family

## 2012-06-19 ENCOUNTER — Telehealth: Payer: Self-pay | Admitting: Family Medicine

## 2012-06-19 DIAGNOSIS — I4891 Unspecified atrial fibrillation: Secondary | ICD-10-CM

## 2012-06-19 MED ORDER — LEVALBUTEROL HCL 0.63 MG/3ML IN NEBU: 0.6300 mg | INHALATION_SOLUTION | Freq: Four times a day (QID) | RESPIRATORY_TRACT | Status: DC

## 2012-06-19 NOTE — Telephone Encounter (Signed)
Spoke with pt's wife and informed that refill for Xopenex was sent to [facility].  Also scheduled appt with TP for med calendar.

## 2012-06-19 NOTE — Telephone Encounter (Signed)
Hold coumadin today only (Friday). Saturday, take 1/2 tab. Then continue 5mg  on Monday and 2.5mg  all other days. Recheck in 2 weeks    Latest dosing instructions   Total Sun Mon Tue Wed Thu Fri Sat   20 2.5 mg 5 mg 2.5 mg 2.5 mg 2.5 mg 2.5 mg 2.5 mg    (2.5 mg1) (2.5 mg2) (2.5 mg1) (2.5 mg1) (2.5 mg1) (2.5 mg1) (2.5 mg1)

## 2012-06-19 NOTE — Telephone Encounter (Signed)
Debbie from [facility] is reporting PT 50.4/INR 3.9. Pt had fish and coleslaw yesterday.

## 2012-06-19 NOTE — Telephone Encounter (Signed)
Nurse Debbie aware of orders

## 2012-06-19 NOTE — Patient Instructions (Signed)
Hold coumadin today only (Friday). Saturday, take 1/2 tab. Then continue 5mg on Monday and 2.5mg all other days. Recheck in 2 weeks    Latest dosing instructions   Total Sun Mon Tue Wed Thu Fri Sat   20 2.5 mg 5 mg 2.5 mg 2.5 mg 2.5 mg 2.5 mg 2.5 mg    (2.5 mg1) (2.5 mg2) (2.5 mg1) (2.5 mg1) (2.5 mg1) (2.5 mg1) (2.5 mg1)        

## 2012-06-25 ENCOUNTER — Other Ambulatory Visit: Payer: Self-pay | Admitting: *Deleted

## 2012-06-25 MED ORDER — FUROSEMIDE 20 MG PO TABS: 20.0000 mg | ORAL_TABLET | Freq: Two times a day (BID) | ORAL | Status: DC

## 2012-06-29 ENCOUNTER — Encounter: Payer: Self-pay | Admitting: Adult Health

## 2012-06-29 ENCOUNTER — Ambulatory Visit (INDEPENDENT_AMBULATORY_CARE_PROVIDER_SITE_OTHER): Payer: Medicare Other | Admitting: Adult Health

## 2012-06-29 VITALS — BP 100/70 | HR 102 | Temp 97.1°F | Ht 71.0 in | Wt 157.2 lb

## 2012-06-29 DIAGNOSIS — J449 Chronic obstructive pulmonary disease, unspecified: Secondary | ICD-10-CM

## 2012-06-29 NOTE — Progress Notes (Signed)
Subjective:     Patient ID: [name], male   DOB: Jun 08, 1932, 79 y.o.   MRN: [id number]  HPI  35 yowm with chronic and slowly sp smoking cessation in the 1980s  With progressive doe x years. Reports on good day over the last year he can ambulate about 100 ft before onset of significant dyspnea and usually limits his activity because of this.   [name] He presented to the ER on 05/06/2012 in acute distress. He was placed on BIPAP, PCCM asked to admit   Admit date: 05/06/2012  Discharge date: 05/15/2012  Discharge Diagnoses:  Purulent bronchitis/ aecopd ? How much acei effect > acei d/c'd on admit Atrial fibrillation with RVR  Acute respiratory failure  Dysphagia  HTN (hypertension)  H/O: CVA (cerebrovascular accident)  GERD (gastroesophageal reflux disease)  Acute posthemorrhagic anemia  Gastritis with hemorrhage  Stricture and stenosis of esophagus   05/22/2012 f/u ov/Wert cc about 50 % better since  Admit but leveling off in terms of activity tol and  t now on 2lpm, congested cough but no purulent sputum.  Has flutter not using, confused with meds/ changes. [name] helps some on prednisone taper finished on day of ov.    No purulent sputum. New leg swelling x sev days >>Cont Neb Four times a day  , mucinex /flutter , PPI Twice daily , Increase the diuril to 25 mg one daily  06/29/2012 one-month followup and medication review. Patient returns for a one-month followup and medication review. Patient brought all his medications, we reviewed them and organized them into a medication calendar with patient education. It appears the patient is taking his medications correctly Last visit was advised to increase diuretic.  CXR last ov showed bibasilar aspdz R>L .  Weight is down 9 lbs . No signiicant change in LE edema.  No significant change in dyspnea . Has DOE w/ 50- ft . Wears out easily.  No hemotpysis , chest pain , fever or orthopnea.  CAT 9 .      Review of Systems Constitutional:    No  weight loss, night sweats,  Fevers, chills, + fatigue, or  lassitude.  HEENT:   No headaches,  Difficulty swallowing,  Tooth/dental problems, or  Sore throat,                No sneezing, itching, ear ache, + nasal congestion, post nasal drip,   CV:  No chest pain,  Orthopnea, PND,   anasarca, dizziness, palpitations, syncope.   GI  No heartburn, indigestion, abdominal pain, nausea, vomiting, diarrhea, change in bowel habits, loss of appetite, bloody stools.   Resp:   No coughing up of blood.  No change in color of mucus.  No wheezing.  No chest wall deformity  Skin: no rash or lesions.  GU: no dysuria, change in color of urine, no urgency or frequency.  No flank pain, no hematuria   MS:  No joint pain or swelling.  No decreased range of motion.  No back pain.  Psych:  No change in mood or affect. No depression or anxiety.  No memory loss.          Objective:   Physical Exam    frail elderly wm   HEENT mild turbinate edema.  Oropharynx no thrush or excess pnd or cobblestoning.  No JVD or cervical adenopathy. Mild accessory muscle hypertrophy. Trachea midline, nl thryroid. Chest was hyperinflated by percussion with diminished breath sounds and moderate increased exp time without  wheeze.  Regular rate and rhythm without murmur gallop or rub or increase P2 . Pos 1 + plus edema.  Abd: no hsm, nl excursion. Ext warm without cyanosis or clubbing.     Assessment:          Plan:

## 2012-06-29 NOTE — Patient Instructions (Addendum)
Follow medication calendar closely and bring to each visit. Follow up in 4 weeks with pulmonary function test and chest xray with Dr. Sherene Sires

## 2012-06-29 NOTE — Assessment & Plan Note (Signed)
Compensated on present regimen.  .Patient's medications were reviewed today and patient education was given. Computerized medication calendar was adjusted/completed follow up in 4 weeks with cxr and PFT

## 2012-06-30 ENCOUNTER — Telehealth: Payer: Self-pay | Admitting: Family

## 2012-06-30 ENCOUNTER — Ambulatory Visit (INDEPENDENT_AMBULATORY_CARE_PROVIDER_SITE_OTHER): Payer: Self-pay | Admitting: Family

## 2012-06-30 DIAGNOSIS — I4891 Unspecified atrial fibrillation: Secondary | ICD-10-CM

## 2012-06-30 NOTE — Patient Instructions (Addendum)
Hold Coumadin x 1 week. CBC sent stat! Recheck INR in 24 hours

## 2012-06-30 NOTE — Telephone Encounter (Signed)
Advise patient to not take any coumadin x 1 week. Recheck INR tomorrow afternoon. Vitamin K not indicated unless INR greater than 9. If he has any increase in bleeding, go to the ED.

## 2012-06-30 NOTE — Telephone Encounter (Signed)
Called and spoke with pt and pt is aware.  Also pt put spouse on the phone and she is aware. Explained the importance of pt coming in to get coumadin checked tomorrow.  Advised that if pt had increased bleeding to seek medical attention and go to the ER. Advised due to inclement weather to call if pt cannot come to appt at 1:10 on 07/02/11.  Pt's wife explained that it is very hard to get pt back and forth to the doctor due to him having trouble going back and forth to the restroom.  Pt's spouse states she will bring pt in on 07/01/12 for coumadin appt.

## 2012-07-01 ENCOUNTER — Other Ambulatory Visit: Payer: Self-pay | Admitting: Family Medicine

## 2012-07-01 ENCOUNTER — Encounter: Payer: Medicare Other | Admitting: Family

## 2012-07-03 ENCOUNTER — Ambulatory Visit (INDEPENDENT_AMBULATORY_CARE_PROVIDER_SITE_OTHER): Payer: Medicare Other | Admitting: Family

## 2012-07-03 DIAGNOSIS — I4891 Unspecified atrial fibrillation: Secondary | ICD-10-CM

## 2012-07-03 LAB — CBC WITH DIFFERENTIAL/PLATELET
Basophils Relative: 0.4 % (ref 0.0–3.0)
Eosinophils Absolute: 0.1 10*3/uL (ref 0.0–0.7)
Eosinophils Relative: 1.3 % (ref 0.0–5.0)
Lymphocytes Relative: 10.9 % — ABNORMAL LOW (ref 12.0–46.0)
Neutrophils Relative %: 81.5 % — ABNORMAL HIGH (ref 43.0–77.0)
RBC: 4.03 Mil/uL — ABNORMAL LOW (ref 4.22–5.81)
WBC: 10.5 10*3/uL (ref 4.5–10.5)

## 2012-07-03 LAB — POCT INR: INR: 8

## 2012-07-03 LAB — PROTIME-INR
INR: 6.1 ratio (ref 0.8–1.0)
Prothrombin Time: 62.6 s (ref 10.2–12.4)

## 2012-07-03 NOTE — Patient Instructions (Addendum)
Hold Coumadin x 1 week. CBC sent stat!  Recheck INR in 24 hours. Vitamin K not indicated per guidelines.     Latest dosing instructions   Total Sun Mon Tue Wed Thu Fri Sat   20 2.5 mg 5 mg 2.5 mg 2.5 mg 2.5 mg 2.5 mg 2.5 mg    (2.5 mg1) (2.5 mg2) (2.5 mg1) (2.5 mg1) (2.5 mg1) (2.5 mg1) (2.5 mg1)

## 2012-07-06 ENCOUNTER — Ambulatory Visit (INDEPENDENT_AMBULATORY_CARE_PROVIDER_SITE_OTHER): Payer: Self-pay | Admitting: Family

## 2012-07-06 DIAGNOSIS — I4891 Unspecified atrial fibrillation: Secondary | ICD-10-CM

## 2012-07-06 NOTE — Patient Instructions (Addendum)
Continue to hold Coumadin. Recheck in 2 days.     Latest dosing instructions   Total Sun Mon Tue Wed Thu Fri Sat   20 2.5 mg 5 mg 2.5 mg 2.5 mg 2.5 mg 2.5 mg 2.5 mg    (2.5 mg1) (2.5 mg2) (2.5 mg1) (2.5 mg1) (2.5 mg1) (2.5 mg1) (2.5 mg1)

## 2012-07-08 ENCOUNTER — Telehealth: Payer: Self-pay | Admitting: Family

## 2012-07-08 ENCOUNTER — Ambulatory Visit (INDEPENDENT_AMBULATORY_CARE_PROVIDER_SITE_OTHER): Payer: Self-pay | Admitting: Family

## 2012-07-08 DIAGNOSIS — I4891 Unspecified atrial fibrillation: Secondary | ICD-10-CM

## 2012-07-08 LAB — POCT INR: INR: 2.6

## 2012-07-08 NOTE — Telephone Encounter (Signed)
[name]: [facility]  [phone]  PT    31.4 I&R   2.6  *Pt's certification period is ending 07/14/12

## 2012-07-08 NOTE — Telephone Encounter (Signed)
Resume Coumadin at 1/2 tab every day. Recheck in 1 week.

## 2012-07-08 NOTE — Telephone Encounter (Signed)
Orders given to Debbie. Will recheck on Monday or Tuesday

## 2012-07-08 NOTE — Patient Instructions (Addendum)
Resume Coumadin at 1/2 tab every day. Recheck in 1 week.     Latest dosing instructions   Total Sun Mon Tue Wed Thu Fri Sat   8.75 1.25 mg 1.25 mg 1.25 mg 1.25 mg 1.25 mg 1.25 mg 1.25 mg    (2.5 mg0.5) (2.5 mg0.5) (2.5 mg0.5) (2.5 mg0.5) (2.5 mg0.5) (2.5 mg0.5) (2.5 mg0.5)

## 2012-07-08 NOTE — Addendum Note (Signed)
Addended by: Boone Master E on: 07/08/2012 01:33 PM   Modules accepted: Orders

## 2012-07-10 ENCOUNTER — Encounter: Payer: Medicare Other | Admitting: Family

## 2012-07-10 ENCOUNTER — Encounter: Payer: Self-pay | Admitting: Family Medicine

## 2012-07-13 ENCOUNTER — Ambulatory Visit (INDEPENDENT_AMBULATORY_CARE_PROVIDER_SITE_OTHER): Payer: Self-pay | Admitting: Family

## 2012-07-13 ENCOUNTER — Encounter: Payer: Self-pay | Admitting: Family

## 2012-07-13 ENCOUNTER — Telehealth: Payer: Self-pay | Admitting: Family

## 2012-07-13 DIAGNOSIS — I4891 Unspecified atrial fibrillation: Secondary | ICD-10-CM

## 2012-07-13 LAB — POCT INR: INR: 8

## 2012-07-13 NOTE — Patient Instructions (Addendum)
Hold Coumadin. Recheck in 3 days.   Anticoagulation Dose Instructions as of 07/13/2012     [name] Tue Wed Thu Fri Sat   New Dose 1.25 mg 1.25 mg 1.25 mg 1.25 mg 1.25 mg 1.25 mg 1.25 mg    Description       Hold Coumadin. Recheck in 3 days.

## 2012-07-13 NOTE — Telephone Encounter (Signed)
Hold coumadin. Recheck in 3 days.

## 2012-07-13 NOTE — Telephone Encounter (Signed)
Spoke with pt's wife to schedule 3 day recheck for inr. She would like to see if he can have it done in [place] then have results faxed here. Advised wife to be sure the office in [place] will do it and get me the fax number so that I can fax the order. She states that she will work on that and get back with me

## 2012-07-17 ENCOUNTER — Other Ambulatory Visit: Payer: Self-pay | Admitting: Family Medicine

## 2012-07-20 ENCOUNTER — Encounter (HOSPITAL_COMMUNITY): Payer: Self-pay | Admitting: *Deleted

## 2012-07-20 ENCOUNTER — Emergency Department (HOSPITAL_COMMUNITY): Payer: Medicare Other

## 2012-07-20 ENCOUNTER — Inpatient Hospital Stay (HOSPITAL_COMMUNITY)
Admission: EM | Admit: 2012-07-20 | Discharge: 2012-07-29 | DRG: 308 | Disposition: A | Discharge status: Skilled Nursing Facility | Payer: Medicare Other | Attending: Internal Medicine | Admitting: Internal Medicine

## 2012-07-20 ENCOUNTER — Telehealth: Payer: Self-pay | Admitting: *Deleted

## 2012-07-20 DIAGNOSIS — K259 Gastric ulcer, unspecified as acute or chronic, without hemorrhage or perforation: Secondary | ICD-10-CM

## 2012-07-20 DIAGNOSIS — D131 Benign neoplasm of stomach: Secondary | ICD-10-CM

## 2012-07-20 DIAGNOSIS — I4891 Unspecified atrial fibrillation: Principal | ICD-10-CM

## 2012-07-20 DIAGNOSIS — R142 Eructation: Secondary | ICD-10-CM

## 2012-07-20 DIAGNOSIS — D72829 Elevated white blood cell count, unspecified: Secondary | ICD-10-CM

## 2012-07-20 DIAGNOSIS — J441 Chronic obstructive pulmonary disease with (acute) exacerbation: Secondary | ICD-10-CM | POA: Diagnosis present

## 2012-07-20 DIAGNOSIS — E860 Dehydration: Secondary | ICD-10-CM

## 2012-07-20 DIAGNOSIS — K209 Esophagitis, unspecified without bleeding: Secondary | ICD-10-CM

## 2012-07-20 DIAGNOSIS — I5032 Chronic diastolic (congestive) heart failure: Secondary | ICD-10-CM

## 2012-07-20 DIAGNOSIS — I5031 Acute diastolic (congestive) heart failure: Secondary | ICD-10-CM

## 2012-07-20 DIAGNOSIS — IMO0002 Reserved for concepts with insufficient information to code with codable children: Secondary | ICD-10-CM

## 2012-07-20 DIAGNOSIS — J411 Mucopurulent chronic bronchitis: Secondary | ICD-10-CM

## 2012-07-20 DIAGNOSIS — K648 Other hemorrhoids: Secondary | ICD-10-CM

## 2012-07-20 DIAGNOSIS — E785 Hyperlipidemia, unspecified: Secondary | ICD-10-CM

## 2012-07-20 DIAGNOSIS — Z79899 Other long term (current) drug therapy: Secondary | ICD-10-CM

## 2012-07-20 DIAGNOSIS — G609 Hereditary and idiopathic neuropathy, unspecified: Secondary | ICD-10-CM

## 2012-07-20 DIAGNOSIS — N179 Acute kidney failure, unspecified: Secondary | ICD-10-CM

## 2012-07-20 DIAGNOSIS — J449 Chronic obstructive pulmonary disease, unspecified: Secondary | ICD-10-CM

## 2012-07-20 DIAGNOSIS — Z8673 Personal history of transient ischemic attack (TIA), and cerebral infarction without residual deficits: Secondary | ICD-10-CM

## 2012-07-20 DIAGNOSIS — N170 Acute kidney failure with tubular necrosis: Secondary | ICD-10-CM | POA: Diagnosis present

## 2012-07-20 DIAGNOSIS — R5381 Other malaise: Secondary | ICD-10-CM

## 2012-07-20 DIAGNOSIS — R627 Adult failure to thrive: Secondary | ICD-10-CM | POA: Diagnosis present

## 2012-07-20 DIAGNOSIS — K222 Esophageal obstruction: Secondary | ICD-10-CM

## 2012-07-20 DIAGNOSIS — K529 Noninfective gastroenteritis and colitis, unspecified: Secondary | ICD-10-CM

## 2012-07-20 DIAGNOSIS — Z66 Do not resuscitate: Secondary | ICD-10-CM | POA: Diagnosis present

## 2012-07-20 DIAGNOSIS — D62 Acute posthemorrhagic anemia: Secondary | ICD-10-CM

## 2012-07-20 DIAGNOSIS — R141 Gas pain: Secondary | ICD-10-CM

## 2012-07-20 DIAGNOSIS — K449 Diaphragmatic hernia without obstruction or gangrene: Secondary | ICD-10-CM

## 2012-07-20 DIAGNOSIS — D649 Anemia, unspecified: Secondary | ICD-10-CM

## 2012-07-20 DIAGNOSIS — I6350 Cerebral infarction due to unspecified occlusion or stenosis of unspecified cerebral artery: Secondary | ICD-10-CM

## 2012-07-20 DIAGNOSIS — E876 Hypokalemia: Secondary | ICD-10-CM

## 2012-07-20 DIAGNOSIS — E46 Unspecified protein-calorie malnutrition: Secondary | ICD-10-CM | POA: Diagnosis present

## 2012-07-20 DIAGNOSIS — R7309 Other abnormal glucose: Secondary | ICD-10-CM

## 2012-07-20 DIAGNOSIS — J9610 Chronic respiratory failure, unspecified whether with hypoxia or hypercapnia: Secondary | ICD-10-CM

## 2012-07-20 DIAGNOSIS — I5033 Acute on chronic diastolic (congestive) heart failure: Secondary | ICD-10-CM | POA: Diagnosis present

## 2012-07-20 DIAGNOSIS — Z87898 Personal history of other specified conditions: Secondary | ICD-10-CM

## 2012-07-20 DIAGNOSIS — R531 Weakness: Secondary | ICD-10-CM

## 2012-07-20 DIAGNOSIS — J4489 Other specified chronic obstructive pulmonary disease: Secondary | ICD-10-CM | POA: Diagnosis present

## 2012-07-20 DIAGNOSIS — E538 Deficiency of other specified B group vitamins: Secondary | ICD-10-CM

## 2012-07-20 DIAGNOSIS — K922 Gastrointestinal hemorrhage, unspecified: Secondary | ICD-10-CM

## 2012-07-20 DIAGNOSIS — Z9181 History of falling: Secondary | ICD-10-CM

## 2012-07-20 DIAGNOSIS — I2510 Atherosclerotic heart disease of native coronary artery without angina pectoris: Secondary | ICD-10-CM

## 2012-07-20 DIAGNOSIS — K2971 Gastritis, unspecified, with bleeding: Secondary | ICD-10-CM

## 2012-07-20 DIAGNOSIS — R5383 Other fatigue: Secondary | ICD-10-CM

## 2012-07-20 DIAGNOSIS — R0602 Shortness of breath: Secondary | ICD-10-CM

## 2012-07-20 DIAGNOSIS — R197 Diarrhea, unspecified: Secondary | ICD-10-CM | POA: Diagnosis present

## 2012-07-20 DIAGNOSIS — I509 Heart failure, unspecified: Secondary | ICD-10-CM | POA: Diagnosis present

## 2012-07-20 DIAGNOSIS — D126 Benign neoplasm of colon, unspecified: Secondary | ICD-10-CM

## 2012-07-20 DIAGNOSIS — K219 Gastro-esophageal reflux disease without esophagitis: Secondary | ICD-10-CM

## 2012-07-20 DIAGNOSIS — I10 Essential (primary) hypertension: Secondary | ICD-10-CM

## 2012-07-20 DIAGNOSIS — K2960 Other gastritis without bleeding: Secondary | ICD-10-CM

## 2012-07-20 DIAGNOSIS — Z7901 Long term (current) use of anticoagulants: Secondary | ICD-10-CM

## 2012-07-20 DIAGNOSIS — R1310 Dysphagia, unspecified: Secondary | ICD-10-CM

## 2012-07-20 DIAGNOSIS — R296 Repeated falls: Secondary | ICD-10-CM

## 2012-07-20 HISTORY — DX: Major depressive disorder, single episode, unspecified: F32.9

## 2012-07-20 HISTORY — DX: Unspecified visual loss: H54.7

## 2012-07-20 HISTORY — DX: Hyperlipidemia, unspecified: E78.5

## 2012-07-20 HISTORY — DX: Anemia, unspecified: D64.9

## 2012-07-20 HISTORY — DX: Reserved for inherently not codable concepts without codable children: IMO0001

## 2012-07-20 HISTORY — DX: Depression, unspecified: F32.A

## 2012-07-20 HISTORY — DX: Atrial septal defect: Q21.1

## 2012-07-20 HISTORY — DX: Unspecified chronic bronchitis: J42

## 2012-07-20 HISTORY — DX: Benign prostatic hyperplasia without lower urinary tract symptoms: N40.0

## 2012-07-20 HISTORY — DX: Chronic obstructive pulmonary disease, unspecified: J44.9

## 2012-07-20 HISTORY — DX: Unspecified hearing loss, unspecified ear: H91.90

## 2012-07-20 HISTORY — DX: Heart disease, unspecified: I51.9

## 2012-07-20 HISTORY — DX: Atrial septal defect, unspecified: Q21.10

## 2012-07-20 HISTORY — DX: Noninfective gastroenteritis and colitis, unspecified: K52.9

## 2012-07-20 LAB — CBC
HCT: 36.8 % — ABNORMAL LOW (ref 39.0–52.0)
Hemoglobin: 12.4 g/dL — ABNORMAL LOW (ref 13.0–17.0)
MCV: 80.7 fL (ref 78.0–100.0)
RBC: 4.56 MIL/uL (ref 4.22–5.81)
RDW: 16.1 % — ABNORMAL HIGH (ref 11.5–15.5)
WBC: 11.1 10*3/uL — ABNORMAL HIGH (ref 4.0–10.5)

## 2012-07-20 LAB — RENAL FUNCTION PANEL
BUN: 28 mg/dL — ABNORMAL HIGH (ref 6–23)
CO2: 23 mEq/L (ref 19–32)
CO2: 24 mEq/L (ref 19–32)
Chloride: 92 mEq/L — ABNORMAL LOW (ref 96–112)
Chloride: 95 mEq/L — ABNORMAL LOW (ref 96–112)
Creatinine, Ser: 3.8 mg/dL — ABNORMAL HIGH (ref 0.50–1.35)
Creatinine, Ser: 3.64 mg/dL — ABNORMAL HIGH (ref 0.50–1.35)
GFR calc Af Amer: 17 mL/min — ABNORMAL LOW (ref >90)
GFR calc non Af Amer: 14 mL/min — ABNORMAL LOW (ref >90)
GFR calc non Af Amer: 15 mL/min — ABNORMAL LOW (ref >90)
Glucose, Bld: 114 mg/dL — ABNORMAL HIGH (ref 70–99)
Potassium: <2 mEq/L — CL (ref 3.5–5.1)

## 2012-07-20 LAB — TROPONIN I: Troponin I: <0.3 ng/mL (ref <0.30)

## 2012-07-20 LAB — URINALYSIS, ROUTINE W REFLEX MICROSCOPIC
Glucose, UA: NEGATIVE mg/dL
Protein, ur: 30 mg/dL — AB
Urobilinogen, UA: 0.2 mg/dL (ref 0.0–1.0)

## 2012-07-20 LAB — COMPREHENSIVE METABOLIC PANEL
BUN: 27 mg/dL — ABNORMAL HIGH (ref 6–23)
CO2: 22 mEq/L (ref 19–32)
Chloride: 94 mEq/L — ABNORMAL LOW (ref 96–112)
Creatinine, Ser: 3.77 mg/dL — ABNORMAL HIGH (ref 0.50–1.35)
GFR calc Af Amer: 16 mL/min — ABNORMAL LOW (ref >90)
GFR calc non Af Amer: 14 mL/min — ABNORMAL LOW (ref >90)
Glucose, Bld: 131 mg/dL — ABNORMAL HIGH (ref 70–99)
Total Bilirubin: 0.6 mg/dL (ref 0.3–1.2)

## 2012-07-20 LAB — PRO B NATRIURETIC PEPTIDE: Pro B Natriuretic peptide (BNP): 7119 pg/mL — ABNORMAL HIGH (ref 0–450)

## 2012-07-20 LAB — PHOSPHORUS: Phosphorus: 5 mg/dL — ABNORMAL HIGH (ref 2.3–4.6)

## 2012-07-20 LAB — PROTIME-INR: Prothrombin Time: 26.5 seconds — ABNORMAL HIGH (ref 11.6–15.2)

## 2012-07-20 LAB — MRSA PCR SCREENING: MRSA by PCR: POSITIVE — AB

## 2012-07-20 MED ORDER — MUPIROCIN 2 % EX OINT
TOPICAL_OINTMENT | Freq: Two times a day (BID) | CUTANEOUS | Status: DC
  Administered 2012-07-20 – 2012-07-29 (×17): via TOPICAL
  Filled 2012-07-20: qty 22

## 2012-07-20 MED ORDER — SODIUM CHLORIDE 0.9 % IV SOLN
INTRAVENOUS | Status: DC
  Administered 2012-07-20: 13:00:00 via INTRAVENOUS

## 2012-07-20 MED ORDER — POTASSIUM CHLORIDE 10 MEQ/100ML IV SOLN
10.0000 meq | INTRAVENOUS | Status: AC
  Administered 2012-07-20 – 2012-07-21 (×4): 10 meq via INTRAVENOUS
  Filled 2012-07-20: qty 200
  Filled 2012-07-20 (×2): qty 100

## 2012-07-20 MED ORDER — KCL IN DEXTROSE-NACL 20-5-0.45 MEQ/L-%-% IV SOLN
INTRAVENOUS | Status: DC
  Administered 2012-07-20: 22:00:00 via INTRAVENOUS
  Filled 2012-07-20 (×3): qty 1000

## 2012-07-20 MED ORDER — POTASSIUM CHLORIDE CRYS ER 20 MEQ PO TBCR
40.0000 meq | EXTENDED_RELEASE_TABLET | Freq: Once | ORAL | Status: AC
  Administered 2012-07-20: 40 meq via ORAL
  Filled 2012-07-20: qty 2

## 2012-07-20 MED ORDER — CALCIUM CARBONATE ANTACID 420 MG PO CHEW
840.0000 mg | CHEWABLE_TABLET | Freq: Once | ORAL | Status: DC
  Filled 2012-07-20: qty 2

## 2012-07-20 MED ORDER — SODIUM CHLORIDE 0.9 % IV SOLN: 1.0000 g | Freq: Once | INTRAVENOUS | Status: DC

## 2012-07-20 MED ORDER — SODIUM CHLORIDE 0.9 % IV BOLUS (SEPSIS)
500.0000 mL | Freq: Once | INTRAVENOUS | Status: AC
  Administered 2012-07-20: 500 mL via INTRAVENOUS

## 2012-07-20 MED ORDER — METOPROLOL TARTRATE 1 MG/ML IV SOLN
2.5000 mg | Freq: Four times a day (QID) | INTRAVENOUS | Status: DC | PRN
  Administered 2012-07-21 – 2012-07-27 (×3): 2.5 mg via INTRAVENOUS
  Filled 2012-07-20 (×3): qty 5

## 2012-07-20 MED ORDER — POTASSIUM CHLORIDE CRYS ER 20 MEQ PO TBCR: 20.0000 meq | EXTENDED_RELEASE_TABLET | Freq: Once | ORAL | Status: DC

## 2012-07-20 MED ORDER — CALCIUM CARBONATE ANTACID 500 MG PO CHEW
400.0000 mg | CHEWABLE_TABLET | Freq: Once | ORAL | Status: AC
  Administered 2012-07-20: 400 mg via ORAL
  Filled 2012-07-20: qty 2

## 2012-07-20 MED ORDER — IPRATROPIUM BROMIDE 0.02 % IN SOLN: 0.5000 mg | Freq: Four times a day (QID) | RESPIRATORY_TRACT | Status: DC

## 2012-07-20 MED ORDER — MAGNESIUM SULFATE 40 MG/ML IJ SOLN
2.0000 g | Freq: Once | INTRAMUSCULAR | Status: AC
  Administered 2012-07-20: 2 g via INTRAVENOUS
  Filled 2012-07-20: qty 50

## 2012-07-20 MED ORDER — SACCHAROMYCES BOULARDII 250 MG PO CAPS
250.0000 mg | ORAL_CAPSULE | Freq: Two times a day (BID) | ORAL | Status: DC
  Administered 2012-07-20 – 2012-07-29 (×18): 250 mg via ORAL
  Filled 2012-07-20 (×19): qty 1

## 2012-07-20 MED ORDER — MUPIROCIN 2 % EX OINT
1.0000 "application " | TOPICAL_OINTMENT | Freq: Two times a day (BID) | CUTANEOUS | Status: AC
  Administered 2012-07-21 – 2012-07-25 (×10): 1 via NASAL

## 2012-07-20 MED ORDER — BISOPROLOL FUMARATE 10 MG PO TABS
10.0000 mg | ORAL_TABLET | Freq: Every day | ORAL | Status: DC
  Administered 2012-07-20 – 2012-07-24 (×5): 10 mg via ORAL
  Filled 2012-07-20 (×6): qty 1

## 2012-07-20 MED ORDER — DUTASTERIDE 0.5 MG PO CAPS
0.5000 mg | ORAL_CAPSULE | Freq: Every day | ORAL | Status: DC
  Administered 2012-07-20 – 2012-07-29 (×10): 0.5 mg via ORAL
  Filled 2012-07-20 (×10): qty 1

## 2012-07-20 MED ORDER — SODIUM CHLORIDE 0.9 % IV SOLN
2.0000 g | Freq: Once | INTRAVENOUS | Status: AC
  Administered 2012-07-20: 2 g via INTRAVENOUS
  Filled 2012-07-20: qty 20

## 2012-07-20 MED ORDER — SODIUM CHLORIDE 0.9 % IJ SOLN
3.0000 mL | Freq: Two times a day (BID) | INTRAMUSCULAR | Status: DC
  Administered 2012-07-20 – 2012-07-24 (×7): 3 mL via INTRAVENOUS
  Administered 2012-07-24: 10 mL via INTRAVENOUS
  Administered 2012-07-27 – 2012-07-29 (×3): 3 mL via INTRAVENOUS

## 2012-07-20 MED ORDER — CHLORHEXIDINE GLUCONATE CLOTH 2 % EX PADS
6.0000 | MEDICATED_PAD | Freq: Every morning | CUTANEOUS | Status: AC
  Administered 2012-07-21 – 2012-07-25 (×5): 6 via TOPICAL

## 2012-07-20 MED ORDER — BUDESONIDE 0.25 MG/2ML IN SUSP
0.2500 mg | Freq: Two times a day (BID) | RESPIRATORY_TRACT | Status: DC
  Administered 2012-07-20 – 2012-07-29 (×18): 0.25 mg via RESPIRATORY_TRACT
  Filled 2012-07-20 (×20): qty 2

## 2012-07-20 MED ORDER — NYSTATIN 100000 UNIT/GM EX CREA
TOPICAL_CREAM | Freq: Two times a day (BID) | CUTANEOUS | Status: DC
  Administered 2012-07-20 – 2012-07-29 (×18): via TOPICAL
  Filled 2012-07-20: qty 15

## 2012-07-20 MED ORDER — DILTIAZEM HCL 25 MG/5ML IV SOLN
10.0000 mg | Freq: Once | INTRAVENOUS | Status: AC
  Administered 2012-07-20: 10 mg via INTRAVENOUS
  Filled 2012-07-20: qty 5

## 2012-07-20 MED ORDER — MAGNESIUM SULFATE 50 % IJ SOLN: 2.0000 g | Freq: Once | INTRAMUSCULAR | Status: DC

## 2012-07-20 MED ORDER — PANTOPRAZOLE SODIUM 40 MG PO TBEC
40.0000 mg | DELAYED_RELEASE_TABLET | Freq: Every day | ORAL | Status: DC
  Administered 2012-07-21 – 2012-07-29 (×9): 40 mg via ORAL
  Filled 2012-07-20 (×9): qty 1

## 2012-07-20 MED ORDER — ACETAMINOPHEN 650 MG RE SUPP: 650.0000 mg | Freq: Four times a day (QID) | RECTAL | Status: DC | PRN

## 2012-07-20 MED ORDER — DILTIAZEM HCL 100 MG IV SOLR
5.0000 mg/h | Freq: Once | INTRAVENOUS | Status: AC
  Administered 2012-07-20: 5 mg/h via INTRAVENOUS
  Filled 2012-07-20: qty 100

## 2012-07-20 MED ORDER — CALCIUM GLUCONATE 10 % IV SOLN
1.0000 g | Freq: Once | INTRAVENOUS | Status: AC
  Administered 2012-07-20: 1 g via INTRAVENOUS
  Filled 2012-07-20: qty 10

## 2012-07-20 MED ORDER — ACETAMINOPHEN 325 MG PO TABS
650.0000 mg | ORAL_TABLET | Freq: Four times a day (QID) | ORAL | Status: DC | PRN
  Administered 2012-07-25: 650 mg via ORAL
  Filled 2012-07-20: qty 2

## 2012-07-20 MED ORDER — BIOTENE DRY MOUTH MT LIQD: 15.0000 mL | OROMUCOSAL | Status: DC | PRN

## 2012-07-20 MED ORDER — DIPHENOXYLATE-ATROPINE 2.5-0.025 MG PO TABS: 1.0000 | ORAL_TABLET | Freq: Four times a day (QID) | ORAL | Status: DC | PRN

## 2012-07-20 MED ORDER — POTASSIUM CHLORIDE CRYS ER 20 MEQ PO TBCR
40.0000 meq | EXTENDED_RELEASE_TABLET | Freq: Once | ORAL | Status: DC
  Filled 2012-07-20 (×2): qty 2

## 2012-07-20 MED ORDER — FERROUS SULFATE 325 (65 FE) MG PO TABS
325.0000 mg | ORAL_TABLET | Freq: Two times a day (BID) | ORAL | Status: DC
  Administered 2012-07-21 – 2012-07-29 (×17): 325 mg via ORAL
  Filled 2012-07-20 (×19): qty 1

## 2012-07-20 MED ORDER — POTASSIUM CHLORIDE 20 MEQ/15ML (10%) PO LIQD
40.0000 meq | Freq: Once | ORAL | Status: AC
  Administered 2012-07-21: 40 meq via ORAL
  Filled 2012-07-20: qty 30

## 2012-07-20 MED ORDER — POTASSIUM CHLORIDE 10 MEQ/100ML IV SOLN
10.0000 meq | INTRAVENOUS | Status: AC
  Administered 2012-07-20 (×3): 10 meq via INTRAVENOUS
  Filled 2012-07-20 (×3): qty 100

## 2012-07-20 MED ORDER — SODIUM CHLORIDE 0.9 % IV SOLN
INTRAVENOUS | Status: DC
  Administered 2012-07-20: 15:00:00 via INTRAVENOUS

## 2012-07-20 NOTE — ED Notes (Signed)
Critical lab result: Mg 0.6, Dr Denton Lank notified.

## 2012-07-20 NOTE — H&P (Addendum)
[facility] History and Physical  [name] ZOX:[id number] DOB: Apr 27, 1933 DOA: 07/20/2012  Referring physician:  Cathren Laine PCP:  Kristian Covey, MD  PULM:  Dr. Sherene Sires  Chief Complaint:  Failure to thrive, diarrhea  HPI:  The patient is a 79 y.o. year-old male with history of HTN, CAD, CVA, systolic CHF with EF 45-50%, permanent atrial fibrillation on anticoagulation, COPD, chronic diarrhea, and progressive weakness who presents with generalized weakness.  The patient has not been at his baseline health since before his last admission to the hospital in December (2 months ago).  At that time, he presented with hypotension and fall.  He had diarrhea and was tested for C. Diff, which was negative.  He had electrolyte deficiencies which were repleted and he was discharged to home with lomotil and home PT/OT.  Since going home, he has had persistent watery diarrhea about 2-3 times per day for the last 2-3 months.  Although he was able to walk a few steps and assist with transfers prior to discharge, he has become progressively weaker to the point that he has been able to transfer with great difficulty and is nearing being bedbound.  He has had 4 falls in the last month.  He sits around all day and sleeps.  He has been eating only a few bites of food all day and has been sipping a few small cups of soda during the day.  He has lost between 20-30 lbs in the last year per his wife.  He takes his medications some of the time, including his lasix.  He has been otherwise well.  No recent cold symptoms, nausea, vomiting, cough, congestion.  Overnight, he had three episodes of diarrhea and had to be cleaned up.  His wife has become fatigued and needs respite so she brought him to the ER with request for placement in SNF.    In the ER, he was noted to be in a-fib with RVR to the 150s with severe electrolyte derangement and AKI.  Potassium < 2, BUN 27, creatinine 3.77 (baseline 0.9-1), calcium 5.1,  Magnesium 0.6.  He was initially started on diltiazem gtt, which caused hypotension and which was discontinued when electrolytes were reported.  He received clacium 400mg  po once, ca gluc 3gm, KCl PO (awaiting second dose) and 30 mEQ IV, magnesium 2gm (awaiting a second 2gm) and a bolus.  His heart rate trended down somewhat.    Review of Systems:  Denies fevers, chills, changes to hearing and vision, sinus congestion, sore throat, rhinorrhea, cough, wheeze, chest pressure, palpitations, lightheadedness, nausea, vomiting.  Watery diarrhea as above without blood or mucous.  No dysuria.  Is incontinent of urine and no change in volume, frequency, or odor.  Denies arthralgias, but endorses muscle weakness diffusely.  Has some residual right sided weakness from CVA which is mild.  Endorses depression and anxiety.  No abnormal bleeding, but bruises easily.  Has sacral decubitus ulcer and multiple abrasions on lower legs.  Skin tears on bilateral arms.    Past Medical History  Diagnosis Date  . History of upper gastrointestinal hemorrhage     dr. Dickie La, GI  . Cerebrovascular accident 46  . Gastric polyp   . Dysphagia, unspecified   . Coronary artery disease     nonobstructive by cath 2009  . Permanent atrial fibrillation   . Hypertension   . Esophagitis   . GERD (gastroesophageal reflux disease)   . Erosive gastritis   . FH: colonic  polyps   . Gastric ulcer   . Hiatal hernia   . Internal hemorrhoids   . Hearing impairment   . Vision problems   . Chronic bronchitis    Past Surgical History  Procedure Laterality Date  . Cholecystectomy  1990s  . Asd repair  [id number]  . Inguinal hernia repair  1963  . Partial small bowel resection  2005    ischemic?  [name] Enteroscopy  05/13/2012    Procedure: ENTEROSCOPY;  Surgeon: Louis Meckel, MD;  Location: [facility] ENDOSCOPY;  Service: Endoscopy;  Laterality: N/A;  . Cardioversion      possibly 3 times, dr. brody/cooper   Social History:   reports that he quit smoking about 29 years ago. His smoking use included Cigarettes. He has a 20 pack-year smoking history. He has never used smokeless tobacco. He reports that he does not drink alcohol or use illicit drugs.  Lives in [place] with wife.  Several falls recently, 4 in the last month.      Allergies  Allergen Reactions  . Penicillins     hives    Family History  Problem Relation Age of Onset  . Heart disease Mother   . Heart disease Father   . Prostate cancer Brother     Prior to Admission medications   Medication Sig Start Date End Date Taking? Authorizing Provider  AVODART 0.5 MG capsule TAKE (1) CAPSULE DAILY 07/01/12  Yes Kristian Covey, MD  bisoprolol (ZEBETA) 5 MG tablet Take 2 tablets (10 mg total) by mouth daily. 06/06/12  Yes Vassie Loll, MD  diphenoxylate-atropine (LOMOTIL) 2.5-0.025 MG per tablet TAKE 1 TO 2 TABLETS 4 TIMES A DAY AS NEEDED FOR DIARRHEA OR LOOSE STOOLS 07/17/12  Yes Kristian Covey, MD  ferrous sulfate 325 (65 FE) MG tablet Take 1 tablet (325 mg total) by mouth 2 (two) times daily with a meal. 05/15/12  Yes Belkys A Regalado, MD  furosemide (LASIX) 20 MG tablet Take 20 mg by mouth daily. 06/25/12  Yes Kristian Covey, MD  gabapentin (NEURONTIN) 600 MG tablet Take 600 mg by mouth 2 (two) times daily. 08/15/11 08/14/12 Yes Kristian Covey, MD  ipratropium (ATROVENT) 0.02 % nebulizer solution Take 2.5 mLs (0.5 mg total) by nebulization 4 (four) times daily. 05/15/12  Yes Belkys A Regalado, MD  levalbuterol (XOPENEX) 0.63 MG/3ML nebulizer solution 1 vial in neb twice daily with Ipratropium.  May take extra neb every 4 hours if needed for wheezing/shortness of breath 06/19/12  Yes Nyoka Cowden, MD  megestrol (MEGACE) 40 MG/ML suspension Take one tsp by mouth twice daily as needed for loss of appetite 06/15/12  Yes Kristian Covey, MD  mirtazapine (REMERON) 15 MG tablet Take 15 mg by mouth at bedtime.   Yes Historical Provider, MD  pantoprazole  (PROTONIX) 40 MG tablet Take 40 mg by mouth daily before breakfast.    Yes Historical Provider, MD  potassium phosphate, monobasic, (K-PHOS) 500 MG tablet Take 1 tablet (500 mg total) by mouth daily. 06/15/12  Yes Kristian Covey, MD  simvastatin (ZOCOR) 20 MG tablet Take 20 mg by mouth at bedtime.   Yes Historical Provider, MD  saccharomyces boulardii (FLORASTOR) 250 MG capsule Take 250 mg by mouth 2 (two) times daily. 07/04/12   Historical Provider, MD  warfarin (COUMADIN) 5 MG tablet Take 2.5 mg by mouth daily.     Historical Provider, MD   Physical Exam: Filed Vitals:   07/20/12 1415 07/20/12 1515 07/20/12 1745 07/20/12  1900  BP: 94/56 92/75  109/72  Pulse: 56  38 117  Temp:    98.8 F (37.1 C)  TempSrc:    Oral  Resp: 17 15 17 14   SpO2: 99%  83% 95%     General:  Caucasian male, no acute distress, lying on stretcher  Eyes:  PERRL, anicteric, non-injected.  ENT:  Nares clear, nasal canula in place, OP with dry mucous membranes and debris, non-erythematous without plaques or exudates.    Neck:  Supple without TM or JVD.    Lymph:  No cervical, supraclavicular, or submandibular LAD.    Cardiovascular:  IRRR, tachycardic, no m/r/g.  2+ radial pulses, cool extremities  Respiratory:  Diminished bilateral breath sounds with wheezes audible on the right side, not on left.  No rales or rhonchi.  No increased WOB.    Abdomen:  NABS.  Soft, ND/NT.  Skin:  2 purulent lower extremity abrasions and healing blistered area on the left foot dorsum.  Did not observe sacrum, but family reports sacral decub.  Bilateral lateral arm skin tears.    Musculoskeletal:  Decreased bulk and tone.  1+ bilateral LE edema.    Psychiatric:  A & O x 4.  Flat affect.  Neurologic:  CN 3-12 intact.  4/5 strength bilateral upper extremities, 3/5 RLE, 3+/5 LLE.  Sensation intact.    Labs on Admission:  Basic Metabolic Panel:  Recent Labs Lab 07/20/12 1255 07/20/12 1615  NA 137 136  K <2.0* <2.0*  CL  94* 92*  CO2 22 23  GLUCOSE 131* 124*  BUN 27* 28*  CREATININE 3.77* 3.80*  CALCIUM 5.1* 4.8*  MG 0.6*  --   PHOS 5.0* 5.3*   Liver Function Tests:  Recent Labs Lab 07/20/12 1255 07/20/12 1615  AST 34  --   ALT 15  --   ALKPHOS 63  --   BILITOT 0.6  --   PROT 6.8  --   ALBUMIN 2.2* 2.2*   No results found for this basename: LIPASE, AMYLASE,  in the last 168 hours No results found for this basename: AMMONIA,  in the last 168 hours CBC:  Recent Labs Lab 07/20/12 1255  WBC 11.1*  HGB 12.4*  HCT 36.8*  MCV 80.7  PLT 256   Cardiac Enzymes:  Recent Labs Lab 07/20/12 1255  TROPONINI <0.30    BNP (last 3 results) No results found for this basename: PROBNP,  in the last 8760 hours CBG: No results found for this basename: GLUCAP,  in the last 168 hours  Radiological Exams on Admission: Dg Chest Port 1 View  07/20/2012  *RADIOLOGY REPORT*  Clinical Data: Shortness of breath, hypertension  PORTABLE CHEST - 1 VIEW  Comparison:   06/12/2012 and earlier studies  Findings: Previous median sternotomy.  Enlarged central pulmonary arteries.  Stable mild cardiomegaly.  Interval improvement in the patchy subsegmental atelectasis or infiltrate seen in the lung bases.  No definite effusion although the lateral costophrenic angles are excluded.  IMPRESSION:  1.  Stable cardiomegaly with resolution of bibasilar infiltrates or atelectasis seen previously.   Original Report Authenticated By: D. Deanne Coffer III, MD     EKG:  Atrial fibrillation with RVR, RBBB, ST-segment depressions in V3-V6 and inferior leads more pronounced compared to previous ECG, however, patient is more tachycardic.    Assessment/Plan Principal Problem:   Atrial fibrillation with RVR Active Problems:   HYPERLIPIDEMIA   PERIPHERAL NEUROPATHY   HYPERTENSION   CORONARY ARTERY DISEASE   CEREBROVASCULAR  ACCIDENT   DYSPHAGIA UNSPECIFIED   GERD (gastroesophageal reflux disease)   COPD (chronic obstructive pulmonary  disease)   Hypokalemia   Hypomagnesemia   Hypocalcemia   Acute kidney injury   Chronic diarrhea   Falls frequently   Leukocytosis   Normocytic anemia  Atrial fibrillation with RVR, likely related to electrolyte derangement.  Pt has failed previous attempts at cardioversion. -  Avoid digoxin in setting of AKI -  Avoid dilt (hx of heart failure) -  Continue bisoprolol -  Will give metoprolol 2.5mg  IV if absolutely needed -  Continue electrolyte repletion and gentle hydration -  Continue warfarin, INR at goal, however, spoke with family regarding possibility of stopping warfarin due to falls.    Hypokalemia, severe, likely due to ongoing GI losses from diarrhea, megace, and lasix with poor oral intake -  Replete with PO and IV potassium chloride Hypomagnesemia, severe, as above -  Replete with IV magnesium sulfate Hypocalcemia, severe, as above -   Replete with PO and IV calcium carbonate/gluconate    ** will cycle Renal panels q6h for now to continue repletion **  Acute kidney injury, likely a combination of prerenal azotemia and ATN from chronic dehydration -  FENa -  Urinalysis -  Renal US -  Foley for strict I/O  Chronic diarrhea, unclear etiology -  Enteric precautions -  C diff -  Stool culture -  O&P -  Fecal lactoferrin -  Occult stool   -  Continue lomotil  Leukocytosis, mild and likely related to dehydration, electrolyte disturbance.  -  CXR neg -  Check UA -  Check stool studies  Normocytic anemia, hgb higher than baseline - Tx for hgb < 7  -  Will defer anemia work up for now  CAD/HTN/HLD:  Blood pressures low normal -   Hold antihypertensives and statin pending electrolyte correction -  Pt not on aspirin due to falls risk in the setting of warfarin  Peripheral neuropathy:  Stable -  Hold gabapentin in setting of AKI  GERD (gastroesophageal reflux disease):  Stable.  Continue protonix, but d/c if c diff positive  COPD (chronic obstructive pulmonary  disease), mild wheeze, but no acute distress -  Avoid albuterol in setting of severe hypokalemia -  Continue ipratropium  -  Start budesonide  Falls frequently, may be due to muscle weakness from electrolyte disturbance, -  Correct electrolytes -  PT/OT consultation  Depression, uncontrolled and may be contributing to poor appetitie:  Hold remeron in setting of AKI  Protein calorie malnutrition/weight loss.  May be due to underlying malignancy, depression, diarrhea and poor appetite.  -  Defer malignancy work up to outpatient  Diet:  NPO except meds and chips tonight pending improvement in electrolytes Access:  PIV IVF:  D5 1/2 NS with potassium ** hold if not voiding or potassium near goal Proph:  Therapeutic warfarin  Code Status:  NO chest compressions or shocks if heart stops, NO intubation.  Would want vasopressors and central line or cardioversion if necessary.   Family Communication:  Spoke with patient and his wife, [name], wife,  [phone] Disposition Plan: Admit to stepdown for a-fib with RVR and electrolyte derangement  Time spent: 60 min  Donzell Coller [facility] Pager [phone]  If 7PM-7AM, please contact night-coverage [email or web address] Password [facility] 07/20/2012, 7:20 PM

## 2012-07-20 NOTE — Telephone Encounter (Signed)
Received a VM from daughter Rutherford Limerick this AM.  She reports her mother is having a very difficult time with her father as he has become a lot worse and they are requesting an updated date on the FL-2 that was filled out earlier this year.  [facility] said they can place pt as early as today if they can receive paperwork.  I called Renee to clarify request is his condition has changed so much, she now reports [facility] explained to family pt needs to be stabilized before they can take him.  So he is at [facility], excessive diarrhea, multiple falls, not eating or drinking.  So FL2 can be updated at hospital follow-up visit. FYI only

## 2012-07-20 NOTE — Progress Notes (Signed)
Pt with K+ of 2.3 paged midlevel awaiting call back.

## 2012-07-20 NOTE — Progress Notes (Signed)
Clinical Social Work Department CLINICAL SOCIAL WORK PLACEMENT NOTE 07/20/2012  Patient:  SALMAAN, PATCHIN  Account Number:  [id number] Admit date:  07/20/2012  Clinical Social Worker:  Doree Albee  Date/time:  07/20/2012 06:17 PM  Clinical Social Work is seeking post-discharge placement for this patient at the following level of care:   SKILLED NURSING   (*CSW will update this form in Epic as items are completed)   07/20/2012  Patient/family provided with [facility] Department of Clinical Social Work's list of facilities offering this level of care within the geographic area requested by the patient (or if unable, by the patient's family).  07/20/2012  Patient/family informed of their freedom to choose among providers that offer the needed level of care, that participate in Medicare, Medicaid or managed care program needed by the patient, have an available bed and are willing to accept the patient.  07/20/2012  Patient/family informed of [facility]' ownership interest in [facility], as well as of the fact that they are under no obligation to receive care at this facility.  PASARR submitted to EDS on  PASARR number received from EDS on   FL2 transmitted to all facilities in geographic area requested by pt/family on   FL2 transmitted to all facilities within larger geographic area on   Patient informed that his/her managed care company has contracts with or will negotiate with  certain facilities, including the following:     Patient/family informed of bed offers received:   Patient chooses bed at  Physician recommends and patient chooses bed at    Patient to be transferred to  on   Patient to be transferred to facility by   The following physician request were entered in Epic:   Additional Comments:

## 2012-07-20 NOTE — ED Notes (Signed)
Pt reports generalized weakness x 2 days.

## 2012-07-20 NOTE — Progress Notes (Signed)
[facility] ED CM noted cm consult and spoke with pt and wife in ED room #5 who choice for disposition is for [facility] Reports [facility] 2013 but wife states care for pt at home has become "more than I can handle" and "we have been speaking with staff at [facility]" Wife and pt agreeing to assistance to facility placement

## 2012-07-20 NOTE — Progress Notes (Signed)
ANTICOAGULATION CONSULT NOTE - Initial Consult  Pharmacy Consult for Warfarin Indication: AFib  Allergies  Allergen Reactions  . Penicillins     hives    Patient Measurements:    Vital Signs: Temp: 98.8 F (37.1 C) (02/17 1900) Temp src: Oral (02/17 1900) BP: 109/72 mmHg (02/17 1900) Pulse Rate: 117 (02/17 1900)  Labs:  Recent Labs  07/20/12 1255 07/20/12 1615  HGB 12.4*  --   HCT 36.8*  --   PLT 256  --   LABPROT 26.5*  --   INR 2.59*  --   CREATININE 3.77* 3.80*  TROPONINI <0.30  --     The CrCl is unknown because both a height and weight (above a minimum accepted value) are required for this calculation.   Medical History: Past Medical History  Diagnosis Date  . History of upper gastrointestinal hemorrhage     dr. Dickie La, GI  . Cerebrovascular accident 36  . Gastric polyp   . Dysphagia, unspecified   . Coronary artery disease     nonobstructive by cath 2009  . Permanent atrial fibrillation   . Hypertension   . Esophagitis   . GERD (gastroesophageal reflux disease)   . Erosive gastritis   . FH: colonic polyps   . Gastric ulcer   . Hiatal hernia   . Internal hemorrhoids   . Hearing impairment   . Vision problems   . Chronic bronchitis     Medications:  Scheduled:  . [COMPLETED] calcium carbonate  400 mg of elemental calcium Oral Once  . [COMPLETED] potassium chloride  40 mEq Oral Once  . potassium chloride  40 mEq Oral Once  . [DISCONTINUED] calcium carbonate  840 mg Oral Once  . [DISCONTINUED] magnesium sulfate  2 g Intravenous Once  . [DISCONTINUED] potassium chloride  20 mEq Oral Once   Infusions:  . sodium chloride 20 mL/hr at 07/20/12 1312  . sodium chloride 100 mL/hr at 07/20/12 1515  . [COMPLETED] calcium gluconate 1 GM IV Stopped (07/20/12 1900)  . [COMPLETED] calcium gluconate 2 g (07/20/12 1844)  . [COMPLETED] diltiazem (CARDIZEM) infusion Stopped (07/20/12 1443)  . [COMPLETED] diltiazem Stopped (07/20/12 1443)  . [COMPLETED]  magnesium sulfate 1 - 4 g bolus IVPB 2 g (07/20/12 1843)  . [COMPLETED] magnesium sulfate 1 - 4 g bolus IVPB 2 g (07/20/12 1947)  . [COMPLETED] potassium chloride 10 mEq (07/20/12 1736)  . [COMPLETED] sodium chloride Stopped (07/20/12 1600)    Assessment: 79 yo M with PMH significant for CVA, Afib, upper GIB, and frequent falls, presents to ER 2/17 with CC: FTT, diarrhea. Pt has had poor PO intake. Pt takes warfarin 2.5mg  daily, however, pt hasn't had a dose since 2/10 due to INR = 8 on 2/10. Pt's INR is therapeutic today, however, since it's been 7 days since last dose and INR is just now therapeutic, will hold warfarin dose tonight, check INR tomorrow morning, and assess INR trend before resume warfarin dosing (given hx of upper GIB and frequent falls).  Goal of Therapy:  INR 2-3   Plan:  1)  No warfarin tonight 2)  Reassess INR trend in am  Darrol Angel, PharmD Pager: [phone] 07/20/2012,8:49 PM

## 2012-07-20 NOTE — Progress Notes (Addendum)
Clinical Social Work Department BRIEF PSYCHOSOCIAL ASSESSMENT 07/20/2012  Patient:  [name], [name]     Account Number:  [id number]     Admit date:  07/20/2012  Clinical Social Worker:  Doree Albee  Date/Time:  07/20/2012 05:00 PM  Referred by:  RN  Date Referred:  07/20/2012 Referred for  SNF Placement   Other Referral:   Interview type:  Patient Other interview type:   and pt spouse    PSYCHOSOCIAL DATA Living Status:  FAMILY Admitted from facility:   Level of care:   Primary support name:  Dione Plover Primary support relationship to patient:  SPOUSE Degree of support available:   strong    CURRENT CONCERNS Current Concerns  Post-Acute Placement  SOCIAL WORK ASSESSMENT / PLAN CSW met with pt and pt spouse at bedside. CSW, pt and pt spouse discussed process of identifying approrpriate disposition. Pt spouse did not fully engage in conversation. Pt spouse asked to speak with csw outside. Pt spouse expressed that due to patient increased level of care needed she was unable to provide care for patient at home and concerned regarding patient well being. Pt spouse stated that she had been speaking with [facility] to seek placement from home. Pt spouse is interested in patient going to [facility] for short term rehab when medically stable to help determine if patient can then return home or seek long term snf or alf placement.    Pt spouse stated that she is only interested in [facility] at this time due to close proximity and have already been speaking with the facility. Please see placement note for status of placement.    CSW and pt discussed snf placement process. Pt spouse verbalized understanding of needed recommendations from physical therapy for insurance authorization.    Pt spouse shared that patient daughter and son in law are able to help some but are not able to physically help patient out of bed. Per pt spouse patient has incontinence issues, and  tries to get out of bed to go the bathroom not realizing that he has already gone to the bathroom. Per pt spouse, pt has not been able to get up and use the urinal at the bedside for the past 5 days due to weakness. Pt spouse reprots that pt has had poor intake, and poorly responsive.   Assessment/plan status:  Psychosocial Support/Ongoing Assessment of Needs Other assessment/ plan:   Information/referral to community resources:   skilled nursing facility list    PATIENT'S/FAMILY'S RESPONSE TO PLAN OF CARE: Pt spouse thanked csw for concern and support. Pt was not abel to engage in conversation at this time. Pt spouse is hopeful for patient to be able to discharge to skilled nursing facility, [facility] when medically stable for short term rehab.        Other Concerns:   Pt spouse only interested in [facility] at this time

## 2012-07-20 NOTE — ED Provider Notes (Signed)
History     CSN: [id number]  Arrival date & time 07/20/12  1154   First MD Initiated Contact with Patient 07/20/12 1232      Chief Complaint  Patient presents with  . Weakness    (Consider location/radiation/quality/duration/timing/severity/associated sxs/prior treatment) Patient is a 79 y.o. male presenting with weakness. The history is provided by the patient and the spouse. The history is limited by the condition of the patient.  Weakness Pertinent negatives include no chest pain, no headaches and no shortness of breath.  pt presents via ems w generalized weakness, poor appetite, poor po intake x several days, sob. Pt v difficult historian, poorly responsive to several questions. Hx chronic afib, on coumadin. No recent trauma/fall. Pt denies any pain. No cp. No abd pain. No nvd. No dysuria or gu c/o. No focal numbness or weakness. No fever or chills. Denies change in meds, states is compliant w meds.     Past Medical History  Diagnosis Date  . History of upper gastrointestinal hemorrhage   . Cerebrovascular accident   . Gastric polyp   . Dysphagia, unspecified   . Coronary artery disease     nonobstructive by cath 2009  . Permanent atrial fibrillation   . Hypertension   . Esophagitis   . GERD (gastroesophageal reflux disease)   . Erosive gastritis   . FH: colonic polyps   . Gastric ulcer   . Hiatal hernia   . Internal hemorrhoids     Past Surgical History  Procedure Laterality Date  . Cholecystectomy    . Asd repair  [id number]  . Inguinal hernia repair  1963  . Partial small bowel resection  2005    ischemic?  [name] Enteroscopy  05/13/2012    Procedure: ENTEROSCOPY;  Surgeon: Louis Meckel, MD;  Location: [facility];  Service: Endoscopy;  Laterality: N/A;    History reviewed. No pertinent family history.  History  Substance Use Topics  . Smoking status: Former Smoker -- 1.00 packs/day for 20 years    Types: Cigarettes    Quit date: 06/13/1983  . Smokeless  tobacco: Never Used  . Alcohol Use: No      Review of Systems  Unable to perform ROS: Mental status change  Constitutional: Negative for fever.  Respiratory: Negative for shortness of breath.   Cardiovascular: Negative for chest pain.  Neurological: Positive for weakness. Negative for headaches.  level 5 caveat - poor/difficult historian, limiting ros  Allergies  Penicillins  Home Medications   Current Outpatient Rx  Name  Route  Sig  Dispense  Refill  . AVODART 0.5 MG capsule      TAKE (1) CAPSULE DAILY   90 capsule   3   . bisoprolol (ZEBETA) 5 MG tablet   Oral   Take 2 tablets (10 mg total) by mouth daily.   30 tablet   1   . diphenoxylate-atropine (LOMOTIL) 2.5-0.025 MG per tablet      TAKE 1 TO 2 TABLETS 4 TIMES A DAY AS NEEDED FOR DIARRHEA OR LOOSE STOOLS   40 tablet   0   . ferrous sulfate 325 (65 FE) MG tablet   Oral   Take 1 tablet (325 mg total) by mouth 2 (two) times daily with a meal.   60 tablet   0   . furosemide (LASIX) 20 MG tablet   Oral   Take 20 mg by mouth daily.         [name] gabapentin (NEURONTIN) 600 MG tablet  Oral   Take 600 mg by mouth 2 (two) times daily.         [name] ipratropium (ATROVENT) 0.02 % nebulizer solution   Nebulization   Take 2.5 mLs (0.5 mg total) by nebulization 4 (four) times daily.   75 mL   2   . levalbuterol (XOPENEX) 0.63 MG/3ML nebulizer solution      1 vial in neb twice daily with Ipratropium.  May take extra neb every 4 hours if needed for wheezing/shortness of breath         . megestrol (MEGACE) 40 MG/ML suspension      Take one tsp by mouth twice daily as needed for loss of appetite         . mirtazapine (REMERON) 15 MG tablet   Oral   Take 15 mg by mouth at bedtime.         . pantoprazole (PROTONIX) 40 MG tablet   Oral   Take 40 mg by mouth daily before breakfast.          . potassium phosphate, monobasic, (K-PHOS) 500 MG tablet   Oral   Take 1 tablet (500 mg total) by mouth daily.    30 tablet   5   . simvastatin (ZOCOR) 20 MG tablet   Oral   Take 20 mg by mouth at bedtime.         [name] saccharomyces boulardii (FLORASTOR) 250 MG capsule   Oral   Take 250 mg by mouth 2 (two) times daily.         [name] warfarin (COUMADIN) 5 MG tablet   Oral   Take 2.5 mg by mouth daily.            BP 113/73  Pulse 65  Temp(Src) 98.1 F (36.7 C) (Oral)  SpO2 93%  Physical Exam  Nursing note and vitals reviewed. Constitutional:  Pt general weak, frail appearing.   HENT:  Head: Atraumatic.  Nose: Nose normal.  Mouth/Throat: Oropharynx is clear and moist.  Eyes: Conjunctivae are normal. Pupils are equal, round, and reactive to light. No scleral icterus.  Neck: Neck supple. No tracheal deviation present.  Cardiovascular: Normal heart sounds and intact distal pulses.   Pulmonary/Chest: Effort normal and breath sounds normal. No accessory muscle usage. No respiratory distress. He exhibits no tenderness.  Abdominal: Soft. Bowel sounds are normal. He exhibits no distension and no mass. There is no tenderness. There is no rebound and no guarding.  Genitourinary:  No cva or flank tenderness.   Musculoskeletal: Normal range of motion.  Mild bil ankle edema. Skin tears bil elbows, no cellulitis.   Neurological: He is alert. No cranial nerve deficit.  Awake and alert, answers some questions yes/no, moves bil extremities purposefully, equal grip.   Skin: Skin is warm and dry. He is not diaphoretic.    ED Course  Procedures (including critical care time)  Results for orders placed during the hospital encounter of 07/20/12  CBC      Result Value Range   WBC 11.1 (*) 4.0 - 10.5 K/uL   RBC 4.56  4.22 - 5.81 MIL/uL   Hemoglobin 12.4 (*) 13.0 - 17.0 g/dL   HCT 41.3 (*) 24.4 - 01.0 %   MCV 80.7  78.0 - 100.0 fL   MCH 27.2  26.0 - 34.0 pg   MCHC 33.7  30.0 - 36.0 g/dL   RDW 27.2 (*) 53.6 - 64.4 %   Platelets 256  150 - 400 K/uL  COMPREHENSIVE METABOLIC  PANEL      Result Value  Range   Sodium 137  135 - 145 mEq/L   Potassium <2.0 (*) 3.5 - 5.1 mEq/L   Chloride 94 (*) 96 - 112 mEq/L   CO2 22  19 - 32 mEq/L   Glucose, Bld 131 (*) 70 - 99 mg/dL   BUN 27 (*) 6 - 23 mg/dL   Creatinine, Ser 1.61 (*) 0.50 - 1.35 mg/dL   Calcium 5.1 (*) 8.4 - 10.5 mg/dL   Total Protein 6.8  6.0 - 8.3 g/dL   Albumin 2.2 (*) 3.5 - 5.2 g/dL   AST 34  0 - 37 U/L   ALT 15  0 - 53 U/L   Alkaline Phosphatase 63  39 - 117 U/L   Total Bilirubin 0.6  0.3 - 1.2 mg/dL   GFR calc non Af Amer 14 (*) >90 mL/min   GFR calc Af Amer 16 (*) >90 mL/min  TROPONIN I      Result Value Range   Troponin I <0.30  <0.30 ng/mL  PROTIME-INR      Result Value Range   Prothrombin Time 26.5 (*) 11.6 - 15.2 seconds   INR 2.59 (*) 0.00 - 1.49   Dg Chest Port 1 View  07/20/2012  *RADIOLOGY REPORT*  Clinical Data: Shortness of breath, hypertension  PORTABLE CHEST - 1 VIEW  Comparison:   06/12/2012 and earlier studies  Findings: Previous median sternotomy.  Enlarged central pulmonary arteries.  Stable mild cardiomegaly.  Interval improvement in the patchy subsegmental atelectasis or infiltrate seen in the lung bases.  No definite effusion although the lateral costophrenic angles are excluded.  IMPRESSION:  1.  Stable cardiomegaly with resolution of bibasilar infiltrates or atelectasis seen previously.   Original Report Authenticated By: D. Andria Rhein, MD        MDM  Iv ns. Labs. O2. Monitor. Ecg. Cxr.  Pt with rapid a fib. Hr 110-140's. cardizem 10 mg iv, cardizem gtt at 5 mg/hr.  Reviewed nursing notes and prior charts for additional history.    Date: 07/20/2012  Rate: 131  Rhythm: atrial fibrillation  QRS Axis: right  Intervals: afib  ST/T Wave abnormalities: nonspecific ST/T changes  Conduction Disutrbances:right bundle branch block and afib  Narrative Interpretation:   Old EKG Reviewed: changes noted   Recheck hr 105.  No change in exam from prior.  Labs return, k extremely low, ca v low. Mg,  phos added to labs.  kcl iv and po.  Additional ns iv.   Ca supplementation.  Med service called for admit.  Note made of prior admit 12/30 w similar multiple electrolyte abnormalities, more pronounced on current visit.  CRITICAL CARE Performed by: Suzi Roots   Total critical care time: 45  Critical care time was exclusive of separately billable procedures and treating other patients.  Critical care was necessary to treat or prevent imminent or life-threatening deterioration.  Critical care was time spent personally by me on the following activities: development of treatment plan with patient and/or surrogate as well as nursing, discussions with consultants, evaluation of patient's response to treatment, examination of patient, obtaining history from patient or surrogate, ordering and performing treatments and interventions, ordering and review of laboratory studies, ordering and review of radiographic studies, pulse oximetry and re-evaluation of patient's condition.       Suzi Roots, MD 07/20/12 [phone]

## 2012-07-21 ENCOUNTER — Telehealth: Payer: Self-pay | Admitting: *Deleted

## 2012-07-21 ENCOUNTER — Inpatient Hospital Stay (HOSPITAL_COMMUNITY): Payer: Medicare Other

## 2012-07-21 LAB — RENAL FUNCTION PANEL
Albumin: 1.9 g/dL — ABNORMAL LOW (ref 3.5–5.2)
Albumin: 2.3 g/dL — ABNORMAL LOW (ref 3.5–5.2)
BUN: 27 mg/dL — ABNORMAL HIGH (ref 6–23)
BUN: 26 mg/dL — ABNORMAL HIGH (ref 6–23)
BUN: 24 mg/dL — ABNORMAL HIGH (ref 6–23)
CO2: 19 mEq/L (ref 19–32)
Calcium: 6 mg/dL — CL (ref 8.4–10.5)
Chloride: 97 mEq/L (ref 96–112)
Creatinine, Ser: 3.02 mg/dL — ABNORMAL HIGH (ref 0.50–1.35)
GFR calc Af Amer: 19 mL/min — ABNORMAL LOW (ref >90)
GFR calc Af Amer: 24 mL/min — ABNORMAL LOW (ref >90)
GFR calc non Af Amer: 15 mL/min — ABNORMAL LOW (ref >90)
Glucose, Bld: 136 mg/dL — ABNORMAL HIGH (ref 70–99)
Glucose, Bld: 130 mg/dL — ABNORMAL HIGH (ref 70–99)
Phosphorus: 4.1 mg/dL (ref 2.3–4.6)
Phosphorus: 3.4 mg/dL (ref 2.3–4.6)
Potassium: 2.8 mEq/L — ABNORMAL LOW (ref 3.5–5.1)
Potassium: 4.3 mEq/L (ref 3.5–5.1)
Potassium: 3.6 mEq/L (ref 3.5–5.1)
Sodium: 138 mEq/L (ref 135–145)
Sodium: 137 mEq/L (ref 135–145)

## 2012-07-21 LAB — CBC
Platelets: 256 10*3/uL (ref 150–400)
RDW: 16.1 % — ABNORMAL HIGH (ref 11.5–15.5)
WBC: 9.9 10*3/uL (ref 4.0–10.5)

## 2012-07-21 LAB — PROTEIN / CREATININE RATIO, URINE
Protein Creatinine Ratio: 0.25 — ABNORMAL HIGH (ref 0.00–0.15)
Total Protein, Urine: 47 mg/dL

## 2012-07-21 LAB — MAGNESIUM: Magnesium: 2.1 mg/dL (ref 1.5–2.5)

## 2012-07-21 LAB — PROTIME-INR: INR: 2.51 — ABNORMAL HIGH (ref 0.00–1.49)

## 2012-07-21 LAB — SODIUM, URINE, RANDOM: Sodium, Ur: 14 mEq/L

## 2012-07-21 LAB — TROPONIN I: Troponin I: <0.3 ng/mL (ref <0.30)

## 2012-07-21 MED ORDER — SODIUM CHLORIDE 0.45 % IV SOLN
INTRAVENOUS | Status: DC
  Administered 2012-07-21 – 2012-07-22 (×3): via INTRAVENOUS
  Filled 2012-07-21 (×7): qty 1000

## 2012-07-21 MED ORDER — POTASSIUM CHLORIDE 20 MEQ/15ML (10%) PO LIQD
40.0000 meq | Freq: Once | ORAL | Status: AC
  Administered 2012-07-21: 40 meq via ORAL
  Filled 2012-07-21: qty 30

## 2012-07-21 MED ORDER — WARFARIN - PHARMACIST DOSING INPATIENT: Freq: Every day | Status: DC

## 2012-07-21 MED ORDER — SODIUM CHLORIDE 0.9 % IV BOLUS (SEPSIS)
1000.0000 mL | Freq: Once | INTRAVENOUS | Status: AC
  Administered 2012-07-21: 1000 mL via INTRAVENOUS

## 2012-07-21 MED ORDER — MAGNESIUM SULFATE 50 % IJ SOLN
4.0000 g | Freq: Once | INTRAMUSCULAR | Status: AC
  Administered 2012-07-21: 4 g via INTRAVENOUS
  Filled 2012-07-21: qty 8

## 2012-07-21 MED ORDER — POTASSIUM CHLORIDE 20 MEQ/15ML (10%) PO LIQD
40.0000 meq | Freq: Once | ORAL | Status: AC
  Administered 2012-07-22: 40 meq via ORAL
  Filled 2012-07-21: qty 30

## 2012-07-21 MED ORDER — WARFARIN SODIUM 1 MG PO TABS
1.0000 mg | ORAL_TABLET | Freq: Once | ORAL | Status: AC
  Administered 2012-07-21: 1 mg via ORAL
  Filled 2012-07-21: qty 1

## 2012-07-21 MED ORDER — CALCIUM GLUCONATE 10 % IV SOLN
1.0000 g | Freq: Once | INTRAVENOUS | Status: AC
  Administered 2012-07-22: 1 g via INTRAVENOUS
  Filled 2012-07-21: qty 10

## 2012-07-21 MED ORDER — SODIUM CHLORIDE 0.9 % IV SOLN
1.0000 g | Freq: Once | INTRAVENOUS | Status: AC
  Administered 2012-07-21: 1 g via INTRAVENOUS
  Filled 2012-07-21: qty 10

## 2012-07-21 MED ORDER — BOOST / RESOURCE BREEZE PO LIQD
1.0000 | Freq: Three times a day (TID) | ORAL | Status: DC
  Administered 2012-07-21 – 2012-07-23 (×7): 1 via ORAL

## 2012-07-21 MED ORDER — IPRATROPIUM BROMIDE 0.02 % IN SOLN
0.5000 mg | Freq: Four times a day (QID) | RESPIRATORY_TRACT | Status: DC
  Administered 2012-07-21 – 2012-07-22 (×5): 0.5 mg via RESPIRATORY_TRACT
  Filled 2012-07-21 (×5): qty 2.5

## 2012-07-21 MED ORDER — SODIUM CHLORIDE 0.9 % IV SOLN
2.0000 g | Freq: Once | INTRAVENOUS | Status: DC
  Filled 2012-07-21: qty 20

## 2012-07-21 MED ORDER — LEVALBUTEROL HCL 0.63 MG/3ML IN NEBU
0.6300 mg | INHALATION_SOLUTION | Freq: Two times a day (BID) | RESPIRATORY_TRACT | Status: DC
  Administered 2012-07-21 – 2012-07-22 (×3): 0.63 mg via RESPIRATORY_TRACT
  Filled 2012-07-21 (×5): qty 3

## 2012-07-21 MED ORDER — POTASSIUM CHLORIDE 10 MEQ/100ML IV SOLN
10.0000 meq | INTRAVENOUS | Status: AC
  Administered 2012-07-21 (×3): 10 meq via INTRAVENOUS
  Filled 2012-07-21: qty 100
  Filled 2012-07-21: qty 200

## 2012-07-21 MED ORDER — SODIUM CHLORIDE 0.9 % IV SOLN
2.0000 g | Freq: Once | INTRAVENOUS | Status: AC
  Administered 2012-07-21: 2 g via INTRAVENOUS
  Filled 2012-07-21: qty 20

## 2012-07-21 NOTE — Progress Notes (Signed)
Pt with ca+of 6.0 mdlevel paged awaitng call back

## 2012-07-21 NOTE — Progress Notes (Signed)
ANTICOAGULATION CONSULT NOTE - Follow up Consult  Pharmacy Consult for Warfarin Indication: AFib  Allergies  Allergen Reactions  . Penicillins     hives    Patient Measurements: Height: 5\' 11"  (180.3 cm) Weight: 142 lb 6.7 oz (64.6 kg) IBW/kg (Calculated) : 75.3  Vital Signs: Temp: 97.6 F (36.4 C) (02/18 0800) Temp src: Oral (02/18 0800) BP: 106/70 mmHg (02/18 0800) Pulse Rate: 109 (02/18 0800)  Labs:  Recent Labs  07/20/12 1255 07/20/12 1615 07/20/12 2155 07/21/12 0310 07/21/12 0920  HGB 12.4*  --   --  11.0*  --   HCT 36.8*  --   --  33.2*  --   PLT 256  --   --  256  --   LABPROT 26.5*  --   --  25.9*  --   INR 2.59*  --   --  2.51*  --   CREATININE 3.77* 3.80* 3.64* 3.49*  --   TROPONINI <0.30  --  <0.30 <0.30 <0.30    Estimated Creatinine Clearance: 15.9 ml/min (by C-G formula based on Cr of 3.49).  Medications:  Scheduled:  . bisoprolol  10 mg Oral Daily  . budesonide (PULMICORT) nebulizer solution  0.25 mg Nebulization BID  . [COMPLETED] calcium carbonate  400 mg of elemental calcium Oral Once  . [COMPLETED] calcium gluconate 1 GM IV  1 g Intravenous Once  . [COMPLETED] calcium gluconate  1 g Intravenous Once  . [COMPLETED] calcium gluconate  2 g Intravenous Once  . Chlorhexidine Gluconate Cloth  6 each Topical q morning - 10a  . [COMPLETED] diltiazem (CARDIZEM) infusion  5 mg/hr Intravenous Once  . [COMPLETED] diltiazem  10 mg Intravenous Once  . dutasteride  0.5 mg Oral Daily  . ferrous sulfate  325 mg Oral BID WC  . ipratropium  0.5 mg Nebulization QID  . levalbuterol  0.63 mg Nebulization BID  . [COMPLETED] magnesium sulfate 1 - 4 g bolus IVPB  2 g Intravenous Once  . [COMPLETED] magnesium sulfate 1 - 4 g bolus IVPB  2 g Intravenous Once  . mupirocin ointment  1 application Nasal BID  . mupirocin ointment   Topical BID  . nystatin cream   Topical BID  . pantoprazole  40 mg Oral Daily  . [COMPLETED] potassium chloride  10 mEq Intravenous Q1  Hr x 3  . [COMPLETED] potassium chloride  10 mEq Intravenous Q1 Hr x 4  . [COMPLETED] potassium chloride  10 mEq Intravenous Q1 Hr x 3  . [COMPLETED] potassium chloride  40 mEq Oral Once  . [COMPLETED] potassium chloride  40 mEq Oral Once  . [COMPLETED] potassium chloride  40 mEq Oral Once  . potassium chloride  40 mEq Oral Once  . saccharomyces boulardii  250 mg Oral BID  . [COMPLETED] sodium chloride  500 mL Intravenous Once  . sodium chloride  3 mL Intravenous Q12H  . [COMPLETED] sodium chloride   Intravenous STAT  . [DISCONTINUED] calcium carbonate  840 mg Oral Once  . [DISCONTINUED] calcium gluconate  1 g Intravenous Once  . [DISCONTINUED] ipratropium  0.5 mg Nebulization Q6H  . [DISCONTINUED] magnesium sulfate  2 g Intravenous Once  . [DISCONTINUED] potassium chloride  20 mEq Oral Once   Infusions:  . [DISCONTINUED] sodium chloride 20 mL/hr at 07/20/12 1312  . [DISCONTINUED] dextrose 5 % and 0.45 % NaCl with KCl 20 mEq/L 75 mL/hr at 07/20/12 2148    Assessment:  79 yo M with PMH significant for CVA, Afib,  upper GIB, and frequent falls, presents to ER 2/17 with CC: FTT, diarrhea. Pt has had poor PO intake. Pt takes warfarin 2.5mg  daily, however, pt hasn't had a dose since 2/10 due to INR = 8 on 2/10.   INR has remained therapeutic.  Plan to resume LOW dose warfarin today.  CBC:  Hgb decreased slightly, Plt remain wnl.  Follow up long-term anticoagulation plan given hx of upper GIB and frequent falls.  Goal of Therapy:  INR 2-3   Plan:   Warfarin 1mg  PO today at 1800  Daily INR and CBC  Lynann Beaver PharmD, BCPS Pager [phone] 07/21/2012 10:40 AM

## 2012-07-21 NOTE — Consult Note (Signed)
WOC consult Note Reason for Consult:Patient with multiple abrasions following falls, skin tears on buttocks Wound type:traumatic injuries Pressure Ulcer POA: No Measurement:Wound on right foot 2.5cm x 4cm x .2cm, wounds on bilateral pretibial areas range from 2x4x.2cm to 4x3x.2cm.  Wounds on bilateral UEs range from 3cm linear and scabbed to 2cm x 3.5cm x .2cm. Two skin tears on the bilateral buttocks measure 4cm x .5cm and are clean, pink and moist. Wound GEX:BMWUX, most are scabbed with dried serum, and shallow, pink wound beds. Drainage (amount, consistency, odor) None to scant serous Periwound:intact, but ecchymotic Dressing procedure/placement/frequency:  I have provided orders for Prevalon Boots for pressure redistribution to the heels and implemented a program for very limited time on his back while in bed.  Additionally, orders are provided for conservative topical therapies for the numerous partial thickness wounds sustained from his multiple falls prior to admission.  The skin tears on his buttocks are best treated with timely incontinence care and a generous application of our house, zinc based skin barrier cream to enhance re-epithelialization.  While in ICU, patient is on a LAL sleep surface; will not require one once he is moved to the med-surg floor. I will not follow, but will remain available to this patient and his nursing and medical teams.  Please re-consult if needed. Thanks, Ladona Mow, MSN, RN, [facility], CWOCN [phone])

## 2012-07-21 NOTE — Telephone Encounter (Signed)
Message copied by Christen Butter on Tue Jul 21, 2012  5:31 PM ------      Message from: [name]      Created: Fri Jun 12, 2012  3:30 PM       Needs f/u ov with cxr by now ------

## 2012-07-21 NOTE — Progress Notes (Signed)
eLink Physician-Brief Progress Note Patient Name: PEGGY MONK DOB: 1932-11-22 MRN: [id number]  Date of Service  07/21/2012   HPI/Events of Note  Hypocalcemia and hypokalemia   eICU Interventions  Potassium and calcium replaced   Intervention Category Intermediate Interventions: Electrolyte abnormality - evaluation and management  Santiago Graf 07/21/2012, 11:29 PM

## 2012-07-21 NOTE — Progress Notes (Signed)
CARE MANAGEMENT NOTE 07/21/2012  Patient:  EGON, DITTUS   Account Number:  [id number]  Date Initiated:  07/21/2012  Documentation initiated by:  DAVIS,RHONDA  Subjective/Objective Assessment:   pt with multiple medical problems, hypotensive, fld challenge,poss sepsis,hx of several falls, atrial fib.     Action/Plan:   from home lives with spouse   Anticipated DC Date:  07/24/2012   Anticipated DC Plan:  HOME/SELF CARE  In-house referral  NA      DC Planning Services  NA      [facility] Choice  NA   Choice offered to / List presented to:  NA   DME arranged  NA      DME agency  NA     HH arranged  NA      HH agency  NA   Status of service:  In process, will continue to follow Medicare Important Message given?  NA - LOS <3 / Initial given by admissions (If response is "NO", the following Medicare IM given date fields will be blank) Date Medicare IM given:   Date Additional Medicare IM given:    Discharge Disposition:    Per UR Regulation:  Reviewed for med. necessity/level of care/duration of stay  If discussed at Long Length of Stay Meetings, dates discussed:    Comments:  [id number]/Rhonda Earlene Plater, RN, BSN, CCM:  CHART REVIEWED AND UPDATED.  Next chart review due on [id number]. NO DISCHARGE NEEDS PRESENT AT THIS TIME. CASE MANAGEMENT [phone]

## 2012-07-21 NOTE — Progress Notes (Addendum)
INITIAL NUTRITION ASSESSMENT  DOCUMENTATION CODES Per approved criteria  -Severe malnutrition in the context of chronic illness  Pt meets criteria for SEVERE MALNUTRITION in the context of chronic illness as evidenced by 17% wt loss in <3 months accompanied by poor po intake.  INTERVENTION: Provide Resource Breeze TID Provide high-calorie, high-protein nutrition education  NUTRITION DIAGNOSIS: Unintentional wt loss related to medical condition as evidenced by 17% wt loss in less than 3 months.   Goal: Pt to meet >/= 90% of their estimated nutrition needs Wt Maintenance Normal bowel function  Monitor:  PO intake Wt Diet advancement Diarrhea frequency  Reason for Assessment: Consult (Poor PO intake and wt loss)  79 y.o. male  Admitting Dx: Atrial fibrillation with RVR  ASSESSMENT: 79 year-old male with history of HTN, CAD, CVA, systolic CHF, permanent atrial fibrillation on anticoagulation, COPD, chronic diarrhea, and progressive weakness who presents with generalized weakness. Chief complaint is failure to thrive and diarrhea. Pt has been having diarrhea 2-3 times per day for the past 2 to 3 months. Pt has also had a decreased appetite and has only been eating a few bites of food and sips of soda daily. Pt reports eating mostly "junk food". Pt confirmed that he weighed 180 lbs a year ago and has been losing more weight during the past 3 months. Pt does not know cause of decreased appetite. Pt interested in learning ways to prevent further weight loss. Gave handout "High calorie, High protein Nutrition Therapy" and discussed handout with pt. RD name and contact information provided if pt or pt's wife have any questions.    Height: Ht Readings from Last 1 Encounters:  07/20/12 5\' 11"  (1.803 m)    Weight: Wt Readings from Last 1 Encounters:  07/20/12 142 lb 6.7 oz (64.6 kg)    Ideal Body Weight: 172 lb  % Ideal Body Weight: 83%  Wt Readings from Last 10 Encounters:   07/20/12 142 lb 6.7 oz (64.6 kg)  06/29/12 157 lb 3.2 oz (71.305 kg)  06/12/12 166 lb (75.297 kg)  06/02/12 164 lb 0.4 oz (74.4 kg)  06/01/12 160 lb (72.576 kg)  05/22/12 165 lb (74.844 kg)  05/22/12 167 lb (75.751 kg)  05/15/12 163 lb 2.3 oz (74 kg)  05/15/12 163 lb 2.3 oz (74 kg)  05/06/12 171 lb (77.565 kg)    Usual Body Weight: 180 lbs  % Usual Body Weight: 79%  BMI:  Body mass index is 19.87 kg/(m^2).  Estimated Nutritional Needs: Kcal: 2130-8657 Protein: 78-90 grams Fluid: >1.9 L/day or per MD discretion  Nutrition Focused Physical Exam:  Subcutaneous Fat:  Orbital Region: WNL Upper Arm Region: moderate wasting Thoracic and Lumbar Region: mild wasting  Muscle:  Temple Region: moderate wasting Clavicle Bone Region: mild wasting Clavicle and Acromion Bone Region: mild wasting Scapular Bone Region: NA Dorsal Hand: moderate wasting Patellar Region: WNL Anterior Thigh Region: WNL Posterior Calf Region: mild wasting  Edema: Present (+3 RUE, +2 RLE and LLE, +3 perineal per RN notes)   Skin: Stage II ulcer on buttocks, Ecchymosis on arms, back, legs, and skin tear on leg  Diet Order: Clear Liquid  EDUCATION NEEDS: -Education needs addressed   Intake/Output Summary (Last 24 hours) at 07/21/12 1116 Last data filed at 07/21/12 0604  Gross per 24 hour  Intake   1330 ml  Output    330 ml  Net   1000 ml    Last BM: PTA  Labs:   Recent Labs Lab 07/20/12 1255 07/20/12  1615 07/20/12 2155 07/21/12 0310  NA 137 136 140 136  K <2.0* <2.0* 2.3* 2.8*  CL 94* 92* 95* 97  CO2 22 23 24 22   BUN 27* 28* 28* 27*  CREATININE 3.77* 3.80* 3.64* 3.49*  CALCIUM 5.1* 4.8* 6.5* 6.0*  MG 0.6*  --   --  2.1  PHOS 5.0* 5.3* 5.3* 4.7*  GLUCOSE 131* 124* 114* 122*    CBG (last 3)  No results found for this basename: GLUCAP,  in the last 72 hours  Scheduled Meds: . bisoprolol  10 mg Oral Daily  . budesonide (PULMICORT) nebulizer solution  0.25 mg Nebulization BID   . Chlorhexidine Gluconate Cloth  6 each Topical q morning - 10a  . dutasteride  0.5 mg Oral Daily  . ferrous sulfate  325 mg Oral BID WC  . ipratropium  0.5 mg Nebulization QID  . levalbuterol  0.63 mg Nebulization BID  . mupirocin ointment  1 application Nasal BID  . mupirocin ointment   Topical BID  . nystatin cream   Topical BID  . pantoprazole  40 mg Oral Daily  . potassium chloride  40 mEq Oral Once  . saccharomyces boulardii  250 mg Oral BID  . sodium chloride  3 mL Intravenous Q12H  . warfarin  1 mg Oral ONCE-1800  . Warfarin - Pharmacist Dosing Inpatient   Does not apply q1800    Continuous Infusions:   Past Medical History  Diagnosis Date  . History of upper gastrointestinal hemorrhage     dr. Dickie La, GI  . Cerebrovascular accident 55  . Gastric polyp   . Dysphagia, unspecified   . Coronary artery disease     nonobstructive by cath 2009  . Permanent atrial fibrillation   . Hypertension   . Esophagitis   . GERD (gastroesophageal reflux disease)   . Erosive gastritis   . FH: colonic polyps   . Gastric ulcer   . Hiatal hernia   . Internal hemorrhoids   . Hearing impairment   . Vision problems   . Chronic bronchitis     Past Surgical History  Procedure Laterality Date  . Cholecystectomy  1990s  . Asd repair  [id number]  . Inguinal hernia repair  1963  . Partial small bowel resection  2005    ischemic?  [name] Enteroscopy  05/13/2012    Procedure: ENTEROSCOPY;  Surgeon: Louis Meckel, MD;  Location: [facility];  Service: Endoscopy;  Laterality: N/A;  . Cardioversion      possibly 3 times, dr. brody/cooper    Ian Malkin RD, LDN Inpatient Clinical Dietitian Pager: [phone] After Hours Pager: [phone]

## 2012-07-21 NOTE — Evaluation (Signed)
Physical Therapy Evaluation Patient Details Name: [name] MRN: [id number] DOB: 03/19/1933 Today's Date: 07/21/2012 Time: 7829-5621 PT Time Calculation (min): 25 min  PT Assessment / Plan / Recommendation Clinical Impression  Pt. is 79 yo male admitted with falls, FTT, decreased function. Pt found to be in afib in ER. Pt. will benefit from PT while in acute cate. Recommend SNF. Chart indicates process was underway from home.     PT Assessment  Patient needs continued PT services    Follow Up Recommendations  SNF    Does the patient have the potential to tolerate intense rehabilitation      Barriers to Discharge        Equipment Recommendations  None recommended by PT    Recommendations for Other Services     Frequency Min 3X/week    Precautions / Restrictions Precautions Precautions: Fall Precaution Comments: monitor HR, RN stated OK to get to edge of bed.   Pertinent Vitals/Pain HR 104 up into 130's with mobility. sats 80 on RA when entered as [name] not in nares. Replaced. sats in 90's 2 l.      Mobility  Bed Mobility Bed Mobility: Rolling Right;Rolling Left;Left Sidelying to Sit;Sit to Sidelying Left Rolling Right: 5: Supervision;With rail Rolling Left: 5: Supervision;With rail Left Sidelying to Sit: 4: Min assist;HOB elevated Sit to Sidelying Left: HOB flat;4: Min guard Details for Bed Mobility Assistance: pt able to move in bed, is limited due to dyspnea, fatigue. Transfers Transfers: Sit to Stand;Stand to Sit Sit to Stand: 1: +2 Total assist;From bed;With upper extremity assist Sit to Stand: Patient Percentage: 60% Stand to Sit: 1: +2 Total assist;To bed;With upper extremity assist Stand to Sit: Patient Percentage: 60% Details for Transfer Assistance: cues for safety, use of UE's Ambulation/Gait Ambulation/Gait Assistance: Not tested (comment)    Exercises     PT Diagnosis: Difficulty walking;Generalized weakness  PT Problem List: Decreased  strength;Decreased range of motion;Decreased activity tolerance;Decreased mobility;Decreased knowledge of use of DME;Decreased safety awareness;Cardiopulmonary status limiting activity PT Treatment Interventions: DME instruction;Gait training;Functional mobility training;Therapeutic activities;Therapeutic exercise;Patient/family education   PT Goals Acute Rehab PT Goals PT Goal Formulation: With patient Time For Goal Achievement: 08/04/12 Potential to Achieve Goals: Good Pt will go Supine/Side to Sit: Independently PT Goal: Supine/Side to Sit - Progress: Goal set today Pt will go Sit to Supine/Side: Independently PT Goal: Sit to Supine/Side - Progress: Goal set today Pt will go Sit to Stand: with supervision PT Goal: Sit to Stand - Progress: Goal set today Pt will go Stand to Sit: with supervision PT Goal: Stand to Sit - Progress: Goal set today Pt will Transfer Bed to Chair/Chair to Bed: with supervision PT Transfer Goal: Bed to Chair/Chair to Bed - Progress: Goal set today Pt will Ambulate: 16 - 50 feet;with supervision;with rolling walker PT Goal: Ambulate - Progress: Goal set today Pt will Perform Home Exercise Program: with supervision, verbal cues required/provided PT Goal: Perform Home Exercise Program - Progress: Goal set today  Visit Information  Last PT Received On: 07/21/12 Assistance Needed: +2    Subjective Data  Subjective: I am here in mind. Patient Stated Goal: agreed to getting up   Prior Functioning  Home Living Lives With: Spouse Type of Home: House Home Access: Level entry Home Layout: Two level;Able to live on main level with bedroom/bathroom Bathroom Shower/Tub: Other (comment) (:Pt spongebathes) Bathroom Toilet: Standard Home Adaptive Equipment: Dan Humphreys - four wheeled;Wheelchair - manual;Bedside commode/3-in-1 Prior Function Level of Independence: Needs assistance Needs  Assistance: Bathing;Dressing;Meal Prep;Light Housekeeping Bath: Maximal Dressing:  Maximal Meal Prep: Total Light Housekeeping: Total Able to Take Stairs?: No Driving: Yes Vocation: Retired Musician: No difficulties Dominant Hand: Right    Cognition  Cognition Overall Cognitive Status: Appears within functional limits for tasks assessed/performed Arousal/Alertness: Awake/alert Orientation Level: Appears intact for tasks assessed Behavior During Session: [facility] for tasks performed    Extremity/Trunk Assessment Right Lower Extremity Assessment RLE ROM/Strength/Tone: Deficits RLE ROM/Strength/Tone Deficits: grossly 4/5 for prtial standing Left Lower Extremity Assessment LLE ROM/Strength/Tone: Deficits LLE ROM/Strength/Tone Deficits: same as R   Balance Balance Balance Assessed: Yes Static Sitting Balance Static Sitting - Balance Support: Bilateral upper extremity supported Static Sitting - Level of Assistance: 5: Stand by assistance Static Sitting - Comment/# of Minutes: pt. props on UE's  End of Session PT - End of Session Activity Tolerance: Patient limited by fatigue Patient left: in bed;with call bell/phone within reach Nurse Communication: Mobility status  GP     Rada Hay 07/21/2012, 9:11 AM [phone]

## 2012-07-21 NOTE — Evaluation (Signed)
Occupational Therapy Evaluation Patient Details Name: [name] MRN: [id number] DOB: 11-25-1932 Today's Date: 07/21/2012 Time: 9518-8416 OT Time Calculation (min): 30 min  OT Assessment / Plan / Recommendation Clinical Impression  Pt. is 79 yo male admitted with falls, FTT, decreased function. Pt found to be in afib in ER. Skilled OT indicated to maximize independence with BADLs in prep for d/c to next venue of care.    OT Assessment  Patient needs continued OT Services    Follow Up Recommendations  SNF    Barriers to Discharge Decreased caregiver support    Equipment Recommendations  Hospital bed    Recommendations for Other Services    Frequency  Min 2X/week    Precautions / Restrictions Precautions Precautions: Fall Precaution Comments: monitor HR, RN stated OK to get to edge of bed.   Pertinent Vitals/Pain Pt denied pain    ADL  Grooming: Wash/dry face;Moderate assistance Where Assessed - Grooming: Unsupported sitting Upper Body Bathing: Maximal assistance Where Assessed - Upper Body Bathing: Unsupported sitting Lower Body Bathing: +2 Total assistance Lower Body Bathing: Patient Percentage: 0% Where Assessed - Lower Body Bathing: Supported sit to stand Upper Body Dressing: Maximal assistance Where Assessed - Upper Body Dressing: Unsupported sitting Lower Body Dressing: +2 Total assistance Lower Body Dressing: Patient Percentage: 0% Where Assessed - Lower Body Dressing: Supported sit to Art therapist Transfer: +2 Total assistance Toilet Transfer: Patient Percentage: 60% Toilet Transfer Method: Sit to Barista: Other (comment) (EOB) Toileting - Clothing Manipulation and Hygiene: +2 Total assistance Toileting - Clothing Manipulation and Hygiene: Patient Percentage: 0% Where Assessed - Toileting Clothing Manipulation and Hygiene: Standing ADL Comments: Pt tolerated unsupported sitting at EOB for several mins before becoming fatigued. HR  fluctuated from 105-135 throughout session. Pt incontinent of bowel. Therapists assisted with cleanup once back in bed 2* fluctuating HR.    OT Diagnosis: Generalized weakness  OT Problem List: Decreased strength;Cardiopulmonary status limiting activity;Decreased activity tolerance;Decreased knowledge of use of DME or AE;Impaired balance (sitting and/or standing) OT Treatment Interventions: Self-care/ADL training;Therapeutic activities;DME and/or AE instruction;Patient/family education;Balance training   OT Goals Acute Rehab OT Goals OT Goal Formulation: With patient Time For Goal Achievement: 08/04/12 Potential to Achieve Goals: Good ADL Goals Pt Will Perform Grooming: with set-up;Sitting, edge of bed;Sitting, chair;Unsupported ADL Goal: Grooming - Progress: Goal set today Pt Will Transfer to Toilet: with min assist;Ambulation;with DME;Stand pivot transfer ADL Goal: Toilet Transfer - Progress: Goal set today Pt Will Perform Toileting - Clothing Manipulation: with mod assist;Sitting on 3-in-1 or toilet;Standing ADL Goal: Toileting - Clothing Manipulation - Progress: Goal set today Pt Will Perform Toileting - Hygiene: with mod assist;Sit to stand from 3-in-1/toilet ADL Goal: Toileting - Hygiene - Progress: Goal set today Additional ADL Goal #1: Pt will tolerate supported static standing with minguard A x30 sec in prep for standing ADL. ADL Goal: Additional Goal #1 - Progress: Goal set today  Visit Information  Last OT Received On: 07/21/12 Assistance Needed: +2    Subjective Data  Subjective: Well I think my body is still here... Patient Stated Goal: Not asked.   Prior Functioning     Home Living Lives With: Spouse Type of Home: House Home Access: Level entry Home Layout: Two level;Able to live on main level with bedroom/bathroom Bathroom Shower/Tub: Other (comment) (:Pt spongebathes) Bathroom Toilet: Standard Home Adaptive Equipment: Dan Humphreys - four wheeled;Wheelchair -  manual;Bedside commode/3-in-1 Prior Function Level of Independence: Needs assistance Needs Assistance: Bathing;Dressing;Meal Prep;Light Housekeeping Bath: Maximal Dressing: Maximal  Meal Prep: Total Light Housekeeping: Total Able to Take Stairs?: No Driving: Yes Vocation: Retired Musician: No difficulties Dominant Hand: Right         Vision/Perception     Copywriter, advertising Overall Cognitive Status: Appears within functional limits for tasks assessed/performed Arousal/Alertness: Awake/alert Orientation Level: Appears intact for tasks assessed Behavior During Session: [facility] for tasks performed    Extremity/Trunk Assessment Right Upper Extremity Assessment RUE ROM/Strength/Tone: Deficits RUE ROM/Strength/Tone Deficits: generalized weakness per functional observation Left Upper Extremity Assessment LUE ROM/Strength/Tone: Deficits LUE ROM/Strength/Tone Deficits: generalized weakness per functional observation Right Lower Extremity Assessment RLE ROM/Strength/Tone: Deficits RLE ROM/Strength/Tone Deficits: grossly 4/5 for prtial standing Left Lower Extremity Assessment LLE ROM/Strength/Tone: Deficits LLE ROM/Strength/Tone Deficits: same as R     Mobility Bed Mobility Bed Mobility: Rolling Right;Rolling Left;Left Sidelying to Sit;Sit to Sidelying Left Rolling Right: 5: Supervision;With rail Rolling Left: 5: Supervision;With rail Left Sidelying to Sit: 4: Min assist;HOB elevated Sit to Sidelying Left: HOB flat;4: Min guard Details for Bed Mobility Assistance: pt able to move in bed, is limited due to dyspnea, fatigue. Transfers Sit to Stand: 1: +2 Total assist;From bed;With upper extremity assist Sit to Stand: Patient Percentage: 60% Stand to Sit: 1: +2 Total assist;To bed;With upper extremity assist Stand to Sit: Patient Percentage: 60% Details for Transfer Assistance: cues for safety, use of UE's     Exercise     Balance Balance Balance  Assessed: Yes Static Sitting Balance Static Sitting - Balance Support: Bilateral upper extremity supported Static Sitting - Level of Assistance: 5: Stand by assistance Static Sitting - Comment/# of Minutes: pt. props on UE's   End of Session OT - End of Session Activity Tolerance: Treatment limited secondary to medical complications (Comment);Patient limited by fatigue (elevated HR.) Patient left: in bed;with call bell/phone within reach  GO     Oliver Heitzenrater A OTR/L [phone] 07/21/2012, 10:26 AM

## 2012-07-21 NOTE — Progress Notes (Signed)
[facility] PROGRESS NOTE  [name]:[id number] DOB: 22-Aug-1932 DOA: 07/20/2012 PCP: Darrell Covey, MD  Assessment/Plan  Atrial fibrillation with RVR, likely related to electrolyte derangement and improving with correction.  Pt has failed previous attempts at cardioversion.  - Continue electrolyte repletion and gentle hydration  - Continue warfarin, INR at goal, however, spoke with family regarding possibility of stopping warfarin due to falls.   Hypokalemia, severe, likely due to GI losses and lasix, improving - Replete with PO and IV potassium chloride  Hypomagnesemia, severe, as above  -  Add on magnesium this AM Hypocalcemia, severe, improving - Replete with PO and IV calcium carbonate/gluconate   Acute kidney injury, likely a combination of prerenal azotemia and ATN from chronic dehydration, oliguric. - FENa < 1%   - Urinalysis with small protein, send UP:C - Renal US pending - Foley for strict I/O (330 mL yesterday) - Consider nephrology consultation if not improving.    Chronic diarrhea, unclear etiology, none so far.  Will continue enteric precautions - Enteric precautions  - C diff, Stool culture, O&P, Fecal lactoferrin, Occult stool  - Continue lomotil   Leukocytosis, mild and likely related to dehydration, electrolyte disturbance.  Resolved - CXR,  UA neg  - Check stool studies  Normocytic anemia, hgb at baseline - Tx for hgb < 7   CAD/HTN/HLD: Blood pressures improved, increased wheeze - restart antihypertensives and statin as needed pending correction of electrolytes - Pt not on aspirin due to falls risk in the setting of warfarin  CHF with EF 45-50%, now with increased lower extremity swelling and wheeze - CXR -  D/C IVF -  No LASIX for now given persistent electrolyte derangement.  COPD (chronic obstructive pulmonary disease), Increased wheeze - Avoid albuterol in setting of severe hypokalemia  - Continue ipratropium  - Continue  budesonide   Peripheral neuropathy: Stable  - Hold gabapentin in setting of AKI   GERD (gastroesophageal reflux disease): Stable. Continue protonix  Falls frequently, may be due to muscle weakness from electrolyte disturbance, improved today  - Correct electrolytes  - PT/OT consultation   Depression, uncontrolled and may be contributing to poor appetitie: Hold remeron in setting of AKI   Protein calorie malnutrition/weight loss. May be due to underlying malignancy, depression, diarrhea and poor appetite.  - Defer malignancy work up to outpatient   Diet:  Clear liquid diet  Access: PIV  IVF:  OFF Proph: Therapeutic warfarin   Code Status: NO chest compressions or shocks if heart stops, NO intubation. Would want vasopressors and central line or cardioversion if necessary.  Family Communication: Spoke with patient alone this morning.  [name], wife, [phone]  Disposition Plan:  Consider transfer to telemetry if electrolytes continuing to improve   Consultants:  None  Procedures:  None  Antibiotics:  None   HPI/Subjective: States that he feels much better.  More alert and talkative this morning.  Denies CP, palpitations, N/V/D.  States he has foley and is unsure about his urine.  Denies SOB or wheeze that is worse than baseline.  Stable cough.    Objective: Filed Vitals:   07/21/12 0200 07/21/12 0400 07/21/12 0604 07/21/12 0607  BP: 97/68 103/86 96/60 125/61  Pulse: 108 113 104 89  Temp:  97.5 F (36.4 C)    TempSrc:  Oral    Resp: 22 19    Height:      Weight:      SpO2: 99% 97%      Intake/Output Summary (  Last 24 hours) at 07/21/12 0747 Last data filed at 07/21/12 0604  Gross per 24 hour  Intake   1330 ml  Output    330 ml  Net   1000 ml   Filed Weights   07/20/12 2058  Weight: 64.6 kg (142 lb 6.7 oz)    Exam:   General:  Thin CM, no acute distress, sitting in bed eating ice chips  HEENT:  NCAT, MMM  Cardiovascular:  IRRR, HR in the  low 100s, had some tachycardia on telemetry to the 140s briefly with some PVC, but overall HR trend is down.  2+ pulses, warm extremities  Respiratory:  Full expiratory wheeze, no rales or rhonchi.  Prolonged expiratory phase.  No accessory muscle use  Abdomen:  Hyperactive BS, soft, ND/NT  MSK:  Decreased tone and bulk, 1+ lower extremity edema  Neuro:  4/5 strength upper extremities and 3+/5 lower extremities, but moves more quickly today.    Data Reviewed: Basic Metabolic Panel:  Recent Labs Lab 07/20/12 1255 07/20/12 1615 07/20/12 2155 07/21/12 0310  NA 137 136 140 136  K <2.0* <2.0* 2.3* 2.8*  CL 94* 92* 95* 97  CO2 22 23 24 22   GLUCOSE 131* 124* 114* 122*  BUN 27* 28* 28* 27*  CREATININE 3.77* 3.80* 3.64* 3.49*  CALCIUM 5.1* 4.8* 6.5* 6.0*  MG 0.6*  --   --   --   PHOS 5.0* 5.3* 5.3* 4.7*   Liver Function Tests:  Recent Labs Lab 07/20/12 1255 07/20/12 1615 07/20/12 2155 07/21/12 0310  AST 34  --   --   --   ALT 15  --   --   --   ALKPHOS 63  --   --   --   BILITOT 0.6  --   --   --   PROT 6.8  --   --   --   ALBUMIN 2.2* 2.2* 2.2* 1.9*   No results found for this basename: LIPASE, AMYLASE,  in the last 168 hours No results found for this basename: AMMONIA,  in the last 168 hours CBC:  Recent Labs Lab 07/20/12 1255 07/21/12 0310  WBC 11.1* 9.9  HGB 12.4* 11.0*  HCT 36.8* 33.2*  MCV 80.7 80.8  PLT 256 256   Cardiac Enzymes:  Recent Labs Lab 07/20/12 1255 07/20/12 2155 07/21/12 0310  TROPONINI <0.30 <0.30 <0.30   BNP (last 3 results)  Recent Labs  07/20/12 2155  PROBNP 7119.0*   CBG: No results found for this basename: GLUCAP,  in the last 168 hours  Recent Results (from the past 240 hour(s))  MRSA PCR SCREENING     Status: Abnormal   Collection Time    07/20/12 10:01 PM      Result Value Range Status   MRSA by PCR POSITIVE (*) NEGATIVE Final   Comment:            The GeneXpert MRSA Assay (FDA     approved for NASAL specimens      only), is one component of a     comprehensive MRSA colonization     surveillance program. It is not     intended to diagnose MRSA     infection nor to guide or     monitor treatment for     MRSA infections.     RESULT CALLED TO, READ BACK BY AND VERIFIED WITH:     C MITCHELL AT 2336 ON 02.17.2014 BY NBROOKS     Studies: Dg  Chest Port 1 View  07/20/2012  *RADIOLOGY REPORT*  Clinical Data: Shortness of breath, hypertension  PORTABLE CHEST - 1 VIEW  Comparison:   06/12/2012 and earlier studies  Findings: Previous median sternotomy.  Enlarged central pulmonary arteries.  Stable mild cardiomegaly.  Interval improvement in the patchy subsegmental atelectasis or infiltrate seen in the lung bases.  No definite effusion although the lateral costophrenic angles are excluded.  IMPRESSION:  1.  Stable cardiomegaly with resolution of bibasilar infiltrates or atelectasis seen previously.   Original Report Authenticated By: D. Andria Rhein, MD     Scheduled Meds: . bisoprolol  10 mg Oral Daily  . budesonide (PULMICORT) nebulizer solution  0.25 mg Nebulization BID  . Chlorhexidine Gluconate Cloth  6 each Topical q morning - 10a  . dutasteride  0.5 mg Oral Daily  . ferrous sulfate  325 mg Oral BID WC  . ipratropium  0.5 mg Nebulization QID  . levalbuterol  0.63 mg Nebulization BID  . mupirocin ointment  1 application Nasal BID  . mupirocin ointment   Topical BID  . nystatin cream   Topical BID  . pantoprazole  40 mg Oral Daily  . potassium chloride  10 mEq Intravenous Q1 Hr x 3  . potassium chloride  40 mEq Oral Once  . saccharomyces boulardii  250 mg Oral BID  . sodium chloride  3 mL Intravenous Q12H   Continuous Infusions: . dextrose 5 % and 0.45 % NaCl with KCl 20 mEq/L 75 mL/hr at 07/20/12 2148    Principal Problem:   Atrial fibrillation with RVR Active Problems:   HYPERLIPIDEMIA   PERIPHERAL NEUROPATHY   HYPERTENSION   CORONARY ARTERY DISEASE   CEREBROVASCULAR ACCIDENT   DYSPHAGIA  UNSPECIFIED   GERD (gastroesophageal reflux disease)   COPD (chronic obstructive pulmonary disease)   Hypokalemia   Hypomagnesemia   Hypocalcemia   Acute kidney injury   Chronic diarrhea   Falls frequently   Leukocytosis   Normocytic anemia    Time spent: 30 min    Tonita Bills  [facility] Pager [phone]. If 7PM-7AM, please contact night-coverage at [email or web address], password [facility] 07/21/2012, 7:47 AM  LOS: 1 day

## 2012-07-21 NOTE — Progress Notes (Signed)
Called elink to inform about pt's ca+ of 6.0 and hr of 130's awaiting orders.

## 2012-07-21 NOTE — Telephone Encounter (Signed)
Dr Sherene Sires, I was going to call the pt and schedule rov with cxr, but looks like he was admitted to [facility] as of 2/17 and they have already done a cxr on him there. Please advise thanks!

## 2012-07-22 ENCOUNTER — Encounter (HOSPITAL_COMMUNITY): Payer: Self-pay | Admitting: Physician Assistant

## 2012-07-22 ENCOUNTER — Inpatient Hospital Stay (HOSPITAL_COMMUNITY): Payer: Medicare Other

## 2012-07-22 DIAGNOSIS — I5033 Acute on chronic diastolic (congestive) heart failure: Secondary | ICD-10-CM

## 2012-07-22 DIAGNOSIS — R197 Diarrhea, unspecified: Secondary | ICD-10-CM

## 2012-07-22 DIAGNOSIS — R5381 Other malaise: Secondary | ICD-10-CM

## 2012-07-22 LAB — RENAL FUNCTION PANEL
Albumin: 1.5 g/dL — ABNORMAL LOW (ref 3.5–5.2)
BUN: 18 mg/dL (ref 6–23)
CO2: 19 mEq/L (ref 19–32)
CO2: 21 mEq/L (ref 19–32)
Calcium: 5.9 mg/dL — CL (ref 8.4–10.5)
Calcium: 6.3 mg/dL — CL (ref 8.4–10.5)
Chloride: 105 mEq/L (ref 96–112)
Chloride: 108 mEq/L (ref 96–112)
Creatinine, Ser: 2.39 mg/dL — ABNORMAL HIGH (ref 0.50–1.35)
Creatinine, Ser: 2.17 mg/dL — ABNORMAL HIGH (ref 0.50–1.35)
Creatinine, Ser: 2 mg/dL — ABNORMAL HIGH (ref 0.50–1.35)
GFR calc Af Amer: 28 mL/min — ABNORMAL LOW (ref >90)
GFR calc non Af Amer: 24 mL/min — ABNORMAL LOW (ref >90)
Glucose, Bld: 99 mg/dL (ref 70–99)
Potassium: 3.5 mEq/L (ref 3.5–5.1)
Sodium: 136 mEq/L (ref 135–145)
Sodium: 136 mEq/L (ref 135–145)

## 2012-07-22 LAB — CBC
MCH: 27.3 pg (ref 26.0–34.0)
MCHC: 33.4 g/dL (ref 30.0–36.0)
Platelets: 199 10*3/uL (ref 150–400)
RDW: 16.3 % — ABNORMAL HIGH (ref 11.5–15.5)

## 2012-07-22 LAB — OCCULT BLOOD X 1 CARD TO LAB, STOOL: Fecal Occult Bld: NEGATIVE

## 2012-07-22 LAB — PROTIME-INR: Prothrombin Time: 28.5 seconds — ABNORMAL HIGH (ref 11.6–15.2)

## 2012-07-22 MED ORDER — FUROSEMIDE 20 MG PO TABS
20.0000 mg | ORAL_TABLET | Freq: Every day | ORAL | Status: DC
  Administered 2012-07-23 – 2012-07-29 (×7): 20 mg via ORAL
  Filled 2012-07-22 (×7): qty 1

## 2012-07-22 MED ORDER — SODIUM CHLORIDE 0.9 % IV SOLN
INTRAVENOUS | Status: DC
  Administered 2012-07-22 (×2): via INTRAVENOUS
  Administered 2012-07-24: 10 mL/h via INTRAVENOUS
  Administered 2012-07-28: 1000 mL via INTRAVENOUS

## 2012-07-22 MED ORDER — SODIUM CHLORIDE 0.9 % IV SOLN
1.0000 g | Freq: Once | INTRAVENOUS | Status: AC
  Administered 2012-07-22: 1 g via INTRAVENOUS
  Filled 2012-07-22: qty 10

## 2012-07-22 MED ORDER — POTASSIUM CHLORIDE CRYS ER 20 MEQ PO TBCR
20.0000 meq | EXTENDED_RELEASE_TABLET | Freq: Once | ORAL | Status: AC
  Administered 2012-07-22: 20 meq via ORAL
  Filled 2012-07-22: qty 1

## 2012-07-22 MED ORDER — LEVALBUTEROL HCL 0.63 MG/3ML IN NEBU
0.6300 mg | INHALATION_SOLUTION | Freq: Three times a day (TID) | RESPIRATORY_TRACT | Status: DC
  Administered 2012-07-22 (×2): 0.63 mg via RESPIRATORY_TRACT
  Filled 2012-07-22 (×7): qty 3

## 2012-07-22 MED ORDER — LOPERAMIDE HCL 2 MG PO CAPS
2.0000 mg | ORAL_CAPSULE | Freq: Two times a day (BID) | ORAL | Status: DC
  Administered 2012-07-22: 2 mg via ORAL
  Filled 2012-07-22 (×4): qty 1

## 2012-07-22 MED ORDER — IPRATROPIUM BROMIDE 0.02 % IN SOLN: 0.5000 mg | Freq: Two times a day (BID) | RESPIRATORY_TRACT | Status: DC

## 2012-07-22 MED ORDER — LEVALBUTEROL HCL 0.63 MG/3ML IN NEBU
0.6300 mg | INHALATION_SOLUTION | RESPIRATORY_TRACT | Status: DC | PRN
  Filled 2012-07-22: qty 3

## 2012-07-22 MED ORDER — DILTIAZEM HCL 100 MG IV SOLR
5.0000 mg/h | INTRAVENOUS | Status: DC
  Administered 2012-07-22 – 2012-07-23 (×2): 5 mg/h via INTRAVENOUS
  Administered 2012-07-24: 15 mg/h via INTRAVENOUS
  Filled 2012-07-22: qty 100

## 2012-07-22 MED ORDER — POTASSIUM CHLORIDE CRYS ER 20 MEQ PO TBCR: 40.0000 meq | EXTENDED_RELEASE_TABLET | Freq: Once | ORAL | Status: DC

## 2012-07-22 MED ORDER — POTASSIUM CHLORIDE 20 MEQ/15ML (10%) PO LIQD
40.0000 meq | Freq: Once | ORAL | Status: AC
  Administered 2012-07-22: 40 meq via ORAL
  Filled 2012-07-22: qty 30

## 2012-07-22 MED ORDER — FUROSEMIDE 10 MG/ML IJ SOLN
80.0000 mg | Freq: Once | INTRAMUSCULAR | Status: AC
  Administered 2012-07-22: 80 mg via INTRAVENOUS
  Filled 2012-07-22: qty 8

## 2012-07-22 MED ORDER — IPRATROPIUM BROMIDE 0.02 % IN SOLN
0.5000 mg | Freq: Three times a day (TID) | RESPIRATORY_TRACT | Status: DC
  Administered 2012-07-22 (×2): 0.5 mg via RESPIRATORY_TRACT
  Filled 2012-07-22 (×2): qty 2.5

## 2012-07-22 NOTE — Consult Note (Addendum)
[name] Consultation  Referring Provider:   [facility] Primary Care Physician:  Darrell Covey, MD Primary Gastroenterologist:    [name]    Reason for Consultation:  Diarrhea           HPI: [name] is a 79 y.o. male with multiple medical problems who we saw in December 2013 for upper GI bleeding on anticoagulants while hospitalized for acute respiratory failure/bronchitis. Patient underwent small bowel enteroscopy that admission with findings of several superficial gastric erosions, a moderate sized GE junction stricture and a very large sliding hiatal hernia measuring 10-12 cm. during that admission patient was having problems with diarrhea as well. By time of discharge diarrhea had improved with Lomotil.  Patient presented to the emergency department 07/20/12 with weakness, decreased appetite, shortness of breath. In the emergency department history was provided by wife. Patient tells me his memory is not good so I am unsure how much of the history he gives me is accurate. According to the admission H&P , patient has had diarrhea since hospital discharge in December and has lost 20-30 pounds over the last year. Patient tells me however that his stools have been loose since the 1980's when he had intestinal surgery. Patient never takes anything for diarrhea at home, he seldom has more than 2 loose bowel movements a day . In ED patient was in A. fib with rapid ventricular response, he is currently admitted to ICU. Stool for C. difficile is negative, ova and parasite pending. White count is normal. Thyroid-stimulating hormone normal. He givers a history of intestinal resection in the 1980's.   Past Medical History  Diagnosis Date  . History of upper gastrointestinal hemorrhage     dr. Dickie [name], GI  . Cerebrovascular accident 76  . Gastric polyp   . Coronary artery disease     nonobstructive by cath 2009  . Permanent atrial fibrillation   . Hypertension   .  Esophagitis   . GERD (gastroesophageal reflux disease)   . Erosive gastritis   . FH: colonic polyps   . Gastric ulcer   . Hiatal hernia   . Internal hemorrhoids   . Hearing impairment   . Vision problems   . Chronic bronchitis     Past Surgical History  Procedure Laterality Date  . Cholecystectomy  1990s  . Asd repair  [id number]  . Inguinal hernia repair  1963  . Partial small bowel resection  2005    ischemic?  [name] Enteroscopy  05/13/2012    Procedure: ENTEROSCOPY;  Surgeon: Darrell Meckel, MD;  Location: [facility];  Service: Endoscopy;  Laterality: N/A;  . Cardioversion      possibly 3 times, dr. brody/[name]    Family History  Problem Relation Age of Onset  . Heart disease Mother   . Heart disease Father   . Prostate cancer Brother      History  Substance Use Topics  . Smoking status: Former Smoker -- 1.00 packs/day for 20 years    Types: Cigarettes    Quit date: 06/13/1983  . Smokeless tobacco: Never Used  . Alcohol Use: No    Prior to Admission medications   Medication Sig Start Date End Date Taking? Authorizing Provider  AVODART 0.5 MG capsule TAKE (1) CAPSULE DAILY 07/01/12  Yes Darrell Covey, MD  bisoprolol (ZEBETA) 5 MG tablet Take 2 tablets (10 mg total) by mouth daily. 06/06/12  Yes Darrell Loll, MD  diphenoxylate-atropine (LOMOTIL) 2.5-0.025 MG per tablet TAKE 1 TO 2  TABLETS 4 TIMES A DAY AS NEEDED FOR DIARRHEA OR LOOSE STOOLS 07/17/12  Yes Darrell Covey, MD  ferrous sulfate 325 (65 FE) MG tablet Take 1 tablet (325 mg total) by mouth 2 (two) times daily with a meal. 05/15/12  Yes Darrell A Regalado, MD  furosemide (LASIX) 20 MG tablet Take 20 mg by mouth daily. 06/25/12  Yes Darrell Covey, MD  gabapentin (NEURONTIN) 600 MG tablet Take 600 mg by mouth 2 (two) times daily. 08/15/11 08/14/12 Yes Darrell Covey, MD  ipratropium (ATROVENT) 0.02 % nebulizer solution Take 2.5 mLs (0.5 mg total) by nebulization 4 (four) times daily. 05/15/12  Yes Darrell A  Regalado, MD  levalbuterol (XOPENEX) 0.63 MG/3ML nebulizer solution 1 vial in neb twice daily with Ipratropium.  May take extra neb every 4 hours if needed for wheezing/shortness of breath 06/19/12  Yes Darrell Cowden, MD  megestrol (MEGACE) 40 MG/ML suspension Take one tsp by mouth twice daily as needed for loss of appetite 06/15/12  Yes Darrell Covey, MD  mirtazapine (REMERON) 15 MG tablet Take 15 mg by mouth at bedtime.   Yes Historical Provider, MD  pantoprazole (PROTONIX) 40 MG tablet Take 40 mg by mouth daily before breakfast.    Yes Historical Provider, MD  potassium phosphate, monobasic, (K-PHOS) 500 MG tablet Take 1 tablet (500 mg total) by mouth daily. 06/15/12  Yes Darrell Covey, MD  simvastatin (ZOCOR) 20 MG tablet Take 20 mg by mouth at bedtime.   Yes Historical Provider, MD  saccharomyces boulardii (FLORASTOR) 250 MG capsule Take 250 mg by mouth 2 (two) times daily. 07/04/12   Historical Provider, MD  warfarin (COUMADIN) 5 MG tablet Take 2.5 mg by mouth daily.     Historical Provider, MD    Current Facility-Administered Medications  Medication Dose Route Frequency Provider Last Rate Last Dose  . acetaminophen (TYLENOL) tablet 650 mg  650 mg Oral Q6H PRN Darrell Fickle, MD       Or  . acetaminophen (TYLENOL) suppository 650 mg  650 mg Rectal Q6H PRN Darrell Fickle, MD      . antiseptic oral rinse (BIOTENE) solution 15 mL  15 mL Mouth Rinse PRN Darrell Fickle, MD      . bisoprolol (ZEBETA) tablet 10 mg  10 mg Oral Daily Darrell Fickle, MD   10 mg at 07/21/12 0957  . budesonide (PULMICORT) nebulizer solution 0.25 mg  0.25 mg Nebulization BID Darrell Fickle, MD   0.25 mg at 07/22/12 0953  . Chlorhexidine Gluconate Cloth 2 % PADS 6 each  6 each Topical q morning - 10a Darrell Fickle, MD   6 each at 07/21/12 1000  . diphenoxylate-atropine (LOMOTIL) 2.5-0.025 MG per tablet 1 tablet  1 tablet Oral QID PRN Darrell Fickle, MD      . dutasteride (AVODART) capsule 0.5 mg  0.5 mg Oral  Daily Darrell Fickle, MD   0.5 mg at 07/21/12 0957  . feeding supplement (RESOURCE BREEZE) liquid 1 Container  1 Container Oral TID BM [name], RD   1 Container at 07/21/12 2031  . ferrous sulfate tablet 325 mg  325 mg Oral BID WC Darrell Fickle, MD   325 mg at 07/22/12 0815  . ipratropium (ATROVENT) nebulizer solution 0.5 mg  0.5 mg Nebulization QID Darrell Fickle, MD   0.5 mg at 07/22/12 0953  . levalbuterol (XOPENEX) nebulizer solution 0.63 mg  0.63 mg Nebulization BID Darrell Fickle, MD   0.63 mg at 07/22/12 0953  .  magnesium sulfate 4 g in dextrose 5 % 250 mL  4 g Intravenous Once Darrell Fickle, MD 10.8 mL/hr at 07/21/12 1839 4 g at 07/21/12 1839  . metoprolol (LOPRESSOR) injection 2.5 mg  2.5 mg Intravenous Q6H PRN Darrell Fickle, MD   2.5 mg at 07/22/12 1015  . mupirocin ointment (BACTROBAN) 2 % 1 application  1 application Nasal BID Darrell Fickle, MD   1 application at 07/21/12 2258  . mupirocin ointment (BACTROBAN) 2 %   Topical BID Darrell Fickle, MD      . nystatin cream (MYCOSTATIN)   Topical BID Darrell Fickle, MD      . pantoprazole (PROTONIX) EC tablet 40 mg  40 mg Oral Daily Darrell Fickle, MD   40 mg at 07/21/12 0957  . potassium chloride 20 MEQ/15ML (10%) liquid 40 mEq  40 mEq Oral Once Adeline C Viyuoh, MD      . potassium chloride SA (K-DUR,KLOR-CON) CR tablet 40 mEq  40 mEq Oral Once Darrell Fickle, MD      . saccharomyces boulardii (FLORASTOR) capsule 250 mg  250 mg Oral BID Darrell Fickle, MD   250 mg at 07/21/12 2258  . sodium chloride 0.45 % 1,000 mL with sodium bicarbonate 25 mEq infusion   Intravenous Continuous Darrell Fickle, MD 200 mL/hr at 07/22/12 0550    . sodium chloride 0.9 % injection 3 mL  3 mL Intravenous Q12H Darrell Fickle, MD   3 mL at 07/21/12 2258  . Warfarin - Pharmacist Dosing Inpatient   Does not apply q1800 Jacqulyn Cane Shade, PHARMD        Allergies as of 07/20/2012 - Review Complete 07/20/2012  Allergen Reaction Noted  .  Penicillins       Review of Systems:   All systems reviewed and negative except where noted in the history of present illness. It should be noted however that patient reports memory loss. There are some discrepancies between history that he provides to me and what his wife provided in the emergency department.  I am reluctant to rely on his review of systems as being accurate   PHYSICAL EXAM: Vital signs in last 24 hours: Temp:  [97.9 F (36.6 C)-98.8 F (37.1 C)] 98.6 F (37 C) (02/19 0800) Pulse Rate:  [31-105] 62 (02/19 0800) Resp:  [16-22] 16 (02/19 0800) BP: (96-113)/(57-94) 112/78 mmHg (02/19 0800) SpO2:  [93 %-98 %] 97 % (02/19 0954)   General:   Pleasant white male in NAD Head:  Normocephalic and atraumatic. Eyes:   No icterus.   Conjunctiva pink. Ears:  Normal auditory acuity. Neck:  Supple; no masses felt Lungs:  Respirations even and unlabored. Lungs clear to auscultation bilaterally.   No wheezes, crackles, or rhonchi.  Heart:  Tachycardic in 120s Abdomen:  Soft, nondistended, nontender. Normal bowel sounds. No appreciable masses or hepatomegaly.  Rectal: No external hemorrhoids seen.  Msk:  Symmetrical without gross deformities.  Extremities:  RUE erythematous and edematous (dressing on ). Neurologic:  Alert and  oriented x4;  grossly normal neurologically. Skin:  Intact without significant lesions or rashes. Cervical Nodes:  No significant cervical adenopathy. Psych:  Alert and cooperative. Normal affect.  LAB RESULTS:  Recent Labs  07/20/12 1255 07/21/12 0310 07/22/12 0313  WBC 11.1* 9.9 10.4  HGB 12.4* 11.0* 10.3*  HCT 36.8* 33.2* 30.8*  PLT 256 256 199   BMET  Recent Labs  07/21/12 2051 07/22/12 0313 07/22/12 0815  NA 137 136 136  K 3.2* 3.2* 3.5  CL  103 103 105  CO2 19 19 19   GLUCOSE 130* 104* 99  BUN 24* 21 20  CREATININE 2.73* 2.39* 2.17*  CALCIUM 6.0* 5.9* 6.3*   LFT  Recent Labs  07/20/12 1255  07/22/12 0815  PROT 6.8  --   --    ALBUMIN 2.2*  < > 1.6*  AST 34  --   --   ALT 15  --   --   ALKPHOS 63  --   --   BILITOT 0.6  --   --   < > = values in this interval not displayed. PT/INR  Recent Labs  07/21/12 0310 07/22/12 0313  LABPROT 25.9* 28.5*  INR 2.51* 2.86*    STUDIES: US Renal  07/21/2012  *RADIOLOGY REPORT*  Clinical Data: Acute renal injury.  Rule out obstruction.  RENAL/URINARY TRACT ULTRASOUND COMPLETE  Comparison:  None.  Findings:  Right Kidney:  12.5 cm.  Slight cortical thinning.  No focal abnormality.  Normal echotexture.  No hydronephrosis or visible stones.  Left Kidney:  12.5 cm.  Slight cortical thinning.  Normal echotexture.  No focal abnormality or hydronephrosis.  No visible stones.  Bladder:  Foley catheter in place with bladder decompressed.  IMPRESSION: Slight cortical thinning bilaterally.  No acute findings.   Original Report Authenticated By: Charlett Nose, M.D.    Dg Chest Port 1 View  07/21/2012  *RADIOLOGY REPORT*  Clinical Data: Wheezing and shortness of breath.  PORTABLE CHEST - 1 VIEW  Comparison: 07/20/2012.  Findings: The cardiac silhouette, mediastinal and hilar contours are stable.  Stable prominent right hilar shadow due to a enlarged right pulmonary artery.  Stable emphysematous changes.  No definite acute overlying pulmonary process.  No pleural effusion or pneumothorax.  IMPRESSION:  Stable emphysematous changes.  No definite acute overlying pulmonary process.   Original Report Authenticated By: Rudie Meyer, M.D.    Dg Chest Port 1 View  07/20/2012  *RADIOLOGY REPORT*  Clinical Data: Shortness of breath, hypertension  PORTABLE CHEST - 1 VIEW  Comparison:   06/12/2012 and earlier studies  Findings: Previous median sternotomy.  Enlarged central pulmonary arteries.  Stable mild cardiomegaly.  Interval improvement in the patchy subsegmental atelectasis or infiltrate seen in the lung bases.  No definite effusion although the lateral costophrenic angles are excluded.  IMPRESSION:   1.  Stable cardiomegaly with resolution of bibasilar infiltrates or atelectasis seen previously.   Original Report Authenticated By: D. Andria Rhein, MD      PREVIOUS ENDOSCOPIES:            05/06/13 Enteroscopy. Gastritis was found in the fundus and body. There were several  linear superficial erosions with pigmented days along the greater  curvature of the gastric fundus and body. Biopsies were taken. At  the GE junction there was a moderate stricture. The 12 mm scope  traversed the stricture without resistance. A very large sliding  hiatal hernia measuring 10-12 cm was noted. The remainder of the  exam including the duodenum and proximal portion of the jejunum  were normal. Retroflexed views revealed no abnormalities. The  scope was then withdrawn from the patient and the procedure  terminated.   Colonoscopy 2005 for evaluation of fecal occult blood. Exam to cecum, prep was good. Findings included only internal hemorrhoids   IMPRESSION / PLAN:        1. Chronic loose stool. Patient describes loose stools, one to 2 times a day since intestinal resection in the 1980's. I cannot get a good  picture of how much diarrhea patient is actually having based on the history that he provides to me but it sounds like his diarrhea has increased over baseline. His nurse estimates one to 2 loose bowel movements every shift. C. difficile is negative. At this point will ask nursing to measure all stool output ( off antidiarrheal medications). PPIs can contribute to diarrhea and this may be something to consider in future. For now, I don't think it is playing a large part.  2. Severe hypoalbuminemia with albumin of 1.6 / weight loss. Weight loss is concerning. No abdominal pain or nausea. He may need imaging studies to rule out underlying malignancy  3. Afib / RVR. Patient on chronic coumadin.  4. Acute kidney failure, ? dehydration.  5. Large hiatal hernia / esophageal stricture and erosive gastritis  December 2012.   6. Multiple medical problems as listed in PMH       Thanks   LOS: 2 days   Willette Cluster  07/22/2012, 10:38 AM    ________________________________________________________________________  [facility] MD note:  I personally examined the patient, reviewed the data and agree with the assessment and plan described above.  He tells me he had chronic loose stools for years and it's not been getting any worse lately. He does not take anything to slow it down, just "puts up with it" at home. Never bloody.  RNs agree loose stools (once this shift, once last night).  I will start him on scheduled anti-diarrheal meds.  He may need outpatient workup for the chronic diarrhea but currently very debilitated appearing, weak and INR is elevated and so no plans for colonoscopy as yet. C. Diff negative, can stop enteric precautions and since the diarrhea is so chronic i doubt he has underlying infection as cause.  Will check Celiac Panel in AM as well. Will advance diet from clears.   Rob Bunting, MD [facility] Pager [phone]

## 2012-07-22 NOTE — Consult Note (Signed)
CARDIOLOGY CONSULT NOTE  Patient ID: [name], MRN: [id number], DOB/AGE: 1932-12-11 79 y.o. Admit date: 07/20/2012   Date of Consult: 07/22/2012 Primary Physician: Darrell Covey, MD Primary Cardiologist: [name] (previously [name])  Chief Complaint: weakness, diarrhea Reason for Consult: afib (chronic, but in the setting of increased rates and falls)  HPI: The patient is a 79 y.o. year-old male with history of HTN, nonobstructive CAD 2009, CVA, systolic CHF with EF 45-50% by echo 05/2012, permanent atrial fibrillation on anticoagulation, COPD, chronic diarrhea, who presented to [facility] 07/20/12 with generalized weakness. The patient has not been at his baseline health since before his last admission to the hospital 2 months ago at which time he presented with hypotension, fall, rib fracture, and diarrhea negative for C diff. He had electrolyte deficiencies which were repleted and he was discharged to home with lomotil and home PT/OT. Since going home, he has had persistent watery diarrhea about 2-3 times per day. Much of the history is obtained from the chart, as the patient is somewhat reserved about the reasons why he is here. Apparently his wife brought him to the ER because she has become fatigued and needs respite so came to request placement for SNF. His weakness has progressed to being near bedbound and he has had 4 falls in he last month, non-syncopal. He has had 20-30lb weight loss. He states he is a "picky eater." He takes his medications some of the time, including his lasix. In the ER, he was noted to be in a-fib with RVR to the 150s with severe electrolyte derangement and AKI. He was able to feel his heart racing when he came in but is not aware of it now (only feels it occasionally). Potassium < 2, BUN/Cr 27/Cr 3.77 (baseline 0.9-1), calcium 5.1, Magnesium 0.6. He was initially started on diltiazem gtt, which caused hypotension and which was discontinued when electrolytes were reported.  His lytes are being repleted by the primary team. AKI is felt due to combination of prerenal azotemia, ATN from dehydration. Lasix has been held. Cr today is 2.17. We are asked to consult re: his afib. Rates have been elevated in the 110-130 range. BP was 113/73 on admission and remains in the 100-110 range. The question has been raised of whether or not he should remain on Coumadin given weakness and falls. CXR 2/18 stable without acute changes. Renal US with slight cortical thinning. He denies any worsening of chronic SOB but does appear wheezy and dyspneic upon our interview. No nausea or vomiting. I/O +4L. Wt 142 on 2/17 --> 158 on 2/19. UOP only 250 cc.   Past Medical History  Diagnosis Date  . History of upper gastrointestinal hemorrhage     dr. Dickie [name], GI  . Cerebrovascular accident 61  . Gastric polyp   . Dysphagia, unspecified   . Coronary artery disease     a. Nonobstructive by cath 2009.  [name] Permanent atrial fibrillation     INR followed at [facility].  [name] Hypertension   . Esophagitis   . GERD (gastroesophageal reflux disease)   . Erosive gastritis     H/o GIB felt secondary to this  . Gastric ulcer   . Hiatal hernia   . Internal hemorrhoids   . Hearing impairment   . Vision problems   . Chronic bronchitis     a. h/o resp failure in setting of bronchitis vs PNA 05/2012.  [name] Hyperlipidemia   . ASD (atrial septal defect)     a. s/p  repair [id number].  [name] BPH (benign prostatic hyperplasia)   . COPD (chronic obstructive pulmonary disease)   . Anemia   . Depression   . LV dysfunction     a. EF 45-50% by echo 05/2012.      Most Recent Cardiac Studies: Cardiac Cath 2009 FINDINGS: Aortic pressure 148/71 with a mean of 103. Left ventricular  pressure 147/11.  Coronary angiography:  Left mainstem: Widely patent. No significant plaque. Trifurcates into  LAD intermediate branch and left circumflex.  LAD: The LAD is a large-caliber vessel. It reaches the LV apex. The  LAD supplies a  medium-size first diagonal branch. There are no other  significant diagonals from the mid or distal portion of the vessel.  Just after the first diagonal there is a 30% stenosis. The remaining  portions of the vessel appear angiographically normal.  Ramus: The ramus intermedius is medium caliber. There is no  significant angiographic stenosis.  Left circumflex. The left circumflex is small. There is a small branch  and second and third OM territories. No significant angiographic  stenosis.  Right coronary artery: The right coronary artery is dominant. Proximal  portion of the vessel has 20% stenosis, mid portion 40% stenosis. The  distal portion has nonobstructive plaque. The vessel terminates in a  PDA and two PL branches. There is no significant stenosis in the branch  vessels of the right coronary artery. Just before the origin of the PDA  and posterior AV segment, there is a focal 50% stenosis.  Left ventricular function is normal. The LVEF is estimated 55-60%.  ASSESSMENT:  1. Non-obstructive coronary artery disease, mainly involving the right  coronary artery with minimal disease in the left anterior  descending.  2. Normal left ventricular function.  3. Normal left ventricular filling pressures.  PLAN: [name] should respond well to medical therapy. He has no  hemodynamically significant coronary stenoses.  2D Echo 05/2012 Study Conclusions - Left ventricle: The cavity size was normal. Wall thickness was normal. Systolic function was mildly reduced. The estimated ejection fraction was in the range of 45% to 50%. - Mitral valve: Mild regurgitation. - Left atrium: The atrium was massively dilated. - Right ventricle: The cavity size was moderately dilated. - Right atrium: The atrium was moderately dilated. - Pulmonary arteries: Systolic pressure was moderately increased. PA peak pressure: 47mm Hg (S).   Surgical History:  Past Surgical History  Procedure Laterality Date  .  Cholecystectomy  1990s  . Asd repair  [id number]  . Inguinal hernia repair  1963  . Partial small bowel resection  2005    ischemic?  [name] Enteroscopy  05/13/2012    Procedure: ENTEROSCOPY;  Surgeon: Louis Meckel, MD;  Location: [facility] ENDOSCOPY;  Service: Endoscopy;  Laterality: N/A;  . Cardioversion      possibly 3 times, dr. brody/Harlean Regula     Home Meds: Prior to Admission medications   Medication Sig Start Date End Date Taking? Authorizing Provider  AVODART 0.5 MG capsule TAKE (1) CAPSULE DAILY 07/01/12  Yes Darrell Covey, MD  bisoprolol (ZEBETA) 5 MG tablet Take 2 tablets (10 mg total) by mouth daily. 06/06/12  Yes Vassie Loll, MD  diphenoxylate-atropine (LOMOTIL) 2.5-0.025 MG per tablet TAKE 1 TO 2 TABLETS 4 TIMES A DAY AS NEEDED FOR DIARRHEA OR LOOSE STOOLS 07/17/12  Yes Darrell Covey, MD  ferrous sulfate 325 (65 FE) MG tablet Take 1 tablet (325 mg total) by mouth 2 (two) times daily with a meal. 05/15/12  Yes  Belkys A Regalado, MD  furosemide (LASIX) 20 MG tablet Take 20 mg by mouth daily. 06/25/12  Yes Darrell Covey, MD  gabapentin (NEURONTIN) 600 MG tablet Take 600 mg by mouth 2 (two) times daily. 08/15/11 08/14/12 Yes Darrell Covey, MD  ipratropium (ATROVENT) 0.02 % nebulizer solution Take 2.5 mLs (0.5 mg total) by nebulization 4 (four) times daily. 05/15/12  Yes Belkys A Regalado, MD  levalbuterol (XOPENEX) 0.63 MG/3ML nebulizer solution 1 vial in neb twice daily with Ipratropium.  May take extra neb every 4 hours if needed for wheezing/shortness of breath 06/19/12  Yes Nyoka Cowden, MD  megestrol (MEGACE) 40 MG/ML suspension Take one tsp by mouth twice daily as needed for loss of appetite 06/15/12  Yes Darrell Covey, MD  mirtazapine (REMERON) 15 MG tablet Take 15 mg by mouth at bedtime.   Yes Historical Provider, MD  pantoprazole (PROTONIX) 40 MG tablet Take 40 mg by mouth daily before breakfast.    Yes Historical Provider, MD  potassium phosphate, monobasic, (K-PHOS) 500 MG  tablet Take 1 tablet (500 mg total) by mouth daily. 06/15/12  Yes Darrell Covey, MD  simvastatin (ZOCOR) 20 MG tablet Take 20 mg by mouth at bedtime.   Yes Historical Provider, MD  saccharomyces boulardii (FLORASTOR) 250 MG capsule Take 250 mg by mouth 2 (two) times daily. 07/04/12   Historical Provider, MD  warfarin (COUMADIN) 5 MG tablet Take 2.5 mg by mouth daily.     Historical Provider, MD    Inpatient Medications:  . bisoprolol  10 mg Oral Daily  . budesonide (PULMICORT) nebulizer solution  0.25 mg Nebulization BID  . Chlorhexidine Gluconate Cloth  6 each Topical q morning - 10a  . dutasteride  0.5 mg Oral Daily  . feeding supplement  1 Container Oral TID BM  . ferrous sulfate  325 mg Oral BID WC  . ipratropium  0.5 mg Nebulization BID  . levalbuterol  0.63 mg Nebulization BID  . magnesium sulfate LVP 250-500 ml  4 g Intravenous Once  . mupirocin ointment  1 application Nasal BID  . mupirocin ointment   Topical BID  . nystatin cream   Topical BID  . pantoprazole  40 mg Oral Daily  . potassium chloride  40 mEq Oral Once  . potassium chloride  40 mEq Oral Once  . saccharomyces boulardii  250 mg Oral BID  . sodium chloride  3 mL Intravenous Q12H  . Warfarin - Pharmacist Dosing Inpatient   Does not apply q1800    Allergies:  Allergies  Allergen Reactions  . Penicillins     hives    History   Social History  . Marital Status: Married    Spouse Name: N/A    Number of Children: N/A  . Years of Education: N/A   Occupational History  . Not on file.   Social History Main Topics  . Smoking status: Former Smoker -- 1.00 packs/day for 20 years    Types: Cigarettes    Quit date: 06/13/1983  . Smokeless tobacco: Never Used  . Alcohol Use: No  . Drug Use: No  . Sexually Active: No   Other Topics Concern  . Not on file   Social History Narrative   Lives in [place] with wife.  Several falls recently, 4 in the last month.        Dione Plover  [phone]  Family History  Problem Relation Age of Onset  . Heart disease Mother   . Heart disease Father   . Prostate cancer Brother      Review of Systems: General: negative for chills, fever Cardiovascular: see above Dermatological: negative for rash Respiratory: negative for cough Urologic: negative for hematuria Abdominal: negative for nausea, vomiting. No GI bleeding. Neurologic: negative for visual changes, syncope All other systems reviewed and are otherwise negative except as noted above.  Labs:  Recent Labs  07/20/12 1255 07/20/12 2155 07/21/12 0310 07/21/12 0920  TROPONINI <0.30 <0.30 <0.30 <0.30   Lab Results  Component Value Date   WBC 10.4 07/22/2012   HGB 10.3* 07/22/2012   HCT 30.8* 07/22/2012   MCV 81.7 07/22/2012   PLT 199 07/22/2012    Recent Labs Lab 07/20/12 1255  07/22/12 0815  NA 137  < > 136  K <2.0*  < > 3.5  CL 94*  < > 105  CO2 22  < > 19  BUN 27*  < > 20  CREATININE 3.77*  < > 2.17*  CALCIUM 5.1*  < > 6.3*  PROT 6.8  --   --   BILITOT 0.6  --   --   ALKPHOS 63  --   --   ALT 15  --   --   AST 34  --   --   GLUCOSE 131*  < > 99  < > = values in this interval not displayed. Lab Results  Component Value Date   CHOL 126 12/18/2011   HDL 53.40 12/18/2011   LDLCALC 47 12/18/2011   TRIG 129.0 12/18/2011     Radiology/Studies:  US Renal 07/21/2012  *RADIOLOGY REPORT*  Clinical Data: Acute renal injury.  Rule out obstruction.  RENAL/URINARY TRACT ULTRASOUND COMPLETE  Comparison:  None.  Findings:  Right Kidney:  12.5 cm.  Slight cortical thinning.  No focal abnormality.  Normal echotexture.  No hydronephrosis or visible stones.  Left Kidney:  12.5 cm.  Slight cortical thinning.  Normal echotexture.  No focal abnormality or hydronephrosis.  No visible stones.  Bladder:  Foley catheter in place with bladder decompressed.  IMPRESSION: Slight cortical thinning bilaterally.  No acute findings.   Original Report Authenticated By: Charlett Nose, M.D.     Dg Chest Port 1 View 07/21/2012  *RADIOLOGY REPORT*  Clinical Data: Wheezing and shortness of breath.  PORTABLE CHEST - 1 VIEW  Comparison: 07/20/2012.  Findings: The cardiac silhouette, mediastinal and hilar contours are stable.  Stable prominent right hilar shadow due to a enlarged right pulmonary artery.  Stable emphysematous changes.  No definite acute overlying pulmonary process.  No pleural effusion or pneumothorax.  IMPRESSION:  Stable emphysematous changes.  No definite acute overlying pulmonary process.   Original Report Authenticated By: Rudie Meyer, M.D.    Dg Chest Port 1 View 07/20/2012  *RADIOLOGY REPORT*  Clinical Data: Shortness of breath, hypertension  PORTABLE CHEST - 1 VIEW  Comparison:   06/12/2012 and earlier studies  Findings: Previous median sternotomy.  Enlarged central pulmonary arteries.  Stable mild cardiomegaly.  Interval improvement in the patchy subsegmental atelectasis or infiltrate seen in the lung bases.  No definite effusion although the lateral costophrenic angles are excluded.  IMPRESSION:  1.  Stable cardiomegaly with resolution of bibasilar infiltrates or atelectasis seen previously.   Original Report Authenticated By: D. Deanne Coffer III, MD     EKG: afib 131bpm RBBB ST depression/TW changes inferiorly & V4-V6, changed from prior  Physical Exam:  Blood pressure 104/65, pulse 109, temperature 98.6 F (37 C), temperature source Oral, resp. rate 24, height 5\' 11"  (1.803 m), weight 142 lb 6.7 oz (64.6 kg), SpO2 100.00%. General: Frail, pale elderly WM in no acute distress. Head: Normocephalic, atraumatic, sclera non-icteric, no xanthomas, nares are without discharge.  Neck: Negative for carotid bruits. JVD not elevated. Lungs: Poor air movement throughout. Coarse breath sounds at bases with occasional expiratory wheezing. He is doing some belly breathing. Heart: Irregular, tachycardic, with S1 S2. No murmurs, rubs, or gallops appreciated. Abdomen: Soft, non-tender,  non-distended with normoactive bowel sounds. No rebound/guarding. Msk:  Strength and tone appear normal for age. Extremities: No clubbing or cyanosis. Distal pedal pulses are 2+ and equal bilaterally. R arm with significant edema, some erythema (bandaged - IV infiltrated yesterday per nursing). Neuro: Alert and oriented X 3. No facial asymmetry. No focal deficit. Moves all extremities spontaneously. Psych:  Responds to questions appropriately with a normal affect.   Assessment and Plan:   1. Acute on chronic diarrhea of unclear etiology 2. Electrolyte disturbance 3. Weight loss 4. Chronic atrial fibrillation, on coumadin, with increased rates this admission 5. Acute kidney failure 6. Acute on chronic diastolic CHF EF 45-50% by echo 05/2012 7. Nonobstructive CAD by cath 2009 8. COPD 9. ASD repair 10. H/o CVA 11. HTN, controlled this admission  D/w Dr. Excell Seltzer. Pt appeared acutely dyspneic upon our evaluation. Stat xray (discussed with Dr. Bonnielee Haff with radiology) showed increasing consolidation markings at bases which could represent early failure vs aspiration. Wt is up from 142 -> 158 over 2 days time and UOP has been poor so there is concern for a/c CHF. We gave stat neb and 80mg  IV Lasix with significant improvement in pulm status. UOP is picking up. Will give additional small dose KCl supplementation. Will resume home dose of Lasix in AM and follow creatinine closely. Suspect increased rates of afib are due to underlying illness - given that BP is now better, will try low dose dilt gtt at 5mg /hr. Given his significant CHADS2 score, will keep on Coumadin for now. See below for further discussion.  Signed, Ronie Spies PA-C 07/22/2012, 11:47 AM  Patient seen, examined. Available data reviewed. Agree with findings, assessment, and plan as outlined by Ronie Spies, PA-C. I have reviewed the patient's lab and imaging data. His most recent outpatient visits were reviewed as well. On exam, he is an  elderly male in no acute distress. Lung sounds are clear with prolonged expiratory phase. Heart is irregularly irregular. Abdomen is tympanic and there is no leg edema. Tele shows atrial fib with a heart rate of 120 bpm. He is clinically improved after receiving a single dose of IV lasix. It appears he presented with marked dehydration and renal failure and has gone into acute heart failure/pulmonary edema after fluid resuscitation. I would resume oral lasix at his home dose, start a dilt drip at low dose, and follow his creatinine closely. We will follow with you. He is at continued fall risk, but is at very high risk of stroke and would continue warfarin for now. thx  Tonny Bollman, M.D. 07/22/2012 4:52 PM

## 2012-07-22 NOTE — Progress Notes (Signed)
Pt with left upper arm leaking. Arm cleaned, elevated and wrapped loosely with non adhesive dressing. Charge nurse in room for 2nd assessment and agreed. Pt tolerated well.

## 2012-07-22 NOTE — Telephone Encounter (Signed)
Ok just needs ov 1-2 weeks after discharge with meds and med calendar in hand

## 2012-07-22 NOTE — Progress Notes (Signed)
Pt with a ca+ of 5.9 midlevel  Paged awaiting call back.

## 2012-07-22 NOTE — Progress Notes (Signed)
[facility] PROGRESS NOTE  [name] BJY:[id number] DOB: 1932-07-30 DOA: 07/20/2012 PCP: Kristian Covey, MD  Assessment/Plan  Atrial fibrillation with RVR, likely related to electrolyte derangement and dehydration.  Pt has failed previous attempts at cardioversion.  - Continue electrolyte repletion and gentle hydration  -I have consulted cardiology for furhter recs -Pt on warfarin, INR at goal - but with recent h/o increased falls pt might not be a good coumadin candidate-will await cards input on this.  Hypokalemia - Replete potassium  -follow and recheck  Hypomagnesemia -  Resolved, mg 2.1 on 2/18  Hypocalcemia, severe, improved - was Repleted with PO and IV calcium carbonate/gluconate  -his corrected calcium today is 8.4 (for albumin of 1.7)  Acute kidney injury, likely a combination of prerenal azotemia and ATN from chronic dehydration, oliguric. - FENa < 1%   - Urinalysis with small protein - Renal US neg for acute findings -Cr gradually improving with hydration, follow  Chronic diarrhea, unclear etiology, none so far.  Will continue enteric precautions - Enteric precautions  - C diff neg, Stool culture, O&P, Fecal lactoferrin, Occult stool pending - Continue lomotil - I have consulted GI for further recs   Leukocytosis, mild and likely related to dehydration, electrolyte disturbance.  Resolved - CXR,  UA neg  - Check stool studies  Normocytic anemia, hgb at baseline - Tx for hgb < 7   CAD/HTN/HLD:  - BP stable but soft this am, follow and restart antihypertensives when clinically appropriate - Pt not on aspirin due to falls risk in the setting of warfarin  CHF with EF 45-50%, now with increased lower extremity swelling and wheeze - obtain f/u cxr in am -Follow and resume lasix when appropriate  COPD (chronic obstructive pulmonary disease),  - Continue ipratropium and xopenex- increase to TID with prn xopenex as well  - Continue budesonide    Peripheral neuropathy: Stable  - Hold gabapentin in setting of AKI   GERD (gastroesophageal reflux disease): Stable. Continue protonix  Falls frequently- may be due to muscle weakness from electrolyte disturbance - Continue correcting electrolytes  - PT/OT  Recommending SNF   Depression, uncontrolled and may be contributing to poor appetitie: follow and resume remeron as cr improves  Protein calorie malnutrition/weight loss. May be due to underlying malignancy, depression, diarrhea and poor appetite.    Proph: Therapeutic warfarin   Code Status: NO chest compressions or shocks if heart stops, NO intubation. Would want vasopressors and central line or cardioversion if necessary.  Family Communication: daughter at bedside  Disposition Plan:  Monitor in ICU today, follow and transfer to tele when HR better controlled   Consultants:  Cardiology- eval pending  GI - eval pending  Procedures:  None  Antibiotics:  None   HPI/Subjective: Pt is alert and answers appropriately this am. He denies chest pain. States still with diarrhea- per nsg 1-2x/shift. daughter is at the bedside and states po intake still decreased although better than prior to admission.    Objective: Filed Vitals:   07/21/12 2051 07/22/12 0009 07/22/12 0200 07/22/12 0400  BP:  112/69 97/64 102/57  Pulse:   84 105  Temp:  98.8 F (37.1 C)  98.8 F (37.1 C)  TempSrc:  Oral  Oral  Resp:   19 19  Height:      Weight:      SpO2: 98%  97% 94%    Intake/Output Summary (Last 24 hours) at 07/22/12 0851 Last data filed at 07/22/12 0800  Gross per  24 hour  Intake 4046.78 ml  Output    795 ml  Net 3251.78 ml   Filed Weights   07/20/12 2058  Weight: 64.6 kg (142 lb 6.7 oz)    Exam:   General:  Elderly male in no acute distress   HEENT:  NCAT, MMM  Cardiovascular:  Irreg, irreg,  Tachycardic  . Respiratory:  Scattered rhonchi, occasional exp wheeze.  Prolonged expiratory phase.  No accessory  muscle use  Abdomen:  Hyperactive BS, soft, ND/NT  MSK:  Decreased tone and bulk, 1+ lower extremity edema Neuro:  Moves all extremities grossly, nonfocal Data Reviewed: Basic Metabolic Panel:  Recent Labs Lab 07/20/12 1255  07/21/12 0310 07/21/12 0920 07/21/12 1429 07/21/12 2051 07/22/12 0313  NA 137  < > 136 138 136 137 136  K <2.0*  < > 2.8* 4.3 3.6 3.2* 3.2*  CL 94*  < > 97 97 102 103 103  CO2 22  < > 22 20 17* 19 19  GLUCOSE 131*  < > 122* 113* 136* 130* 104*  BUN 27*  < > 27* 26* 26* 24* 21  CREATININE 3.77*  < > 3.49* 3.25* 3.02* 2.73* 2.39*  CALCIUM 5.1*  < > 6.0* 6.1* 6.0* 6.0* 5.9*  MG 0.6*  --  2.1  --   --   --   --   PHOS 5.0*  < > 4.7* 4.1 3.4 3.3 2.9  < > = values in this interval not displayed. Liver Function Tests:  Recent Labs Lab 07/20/12 1255  07/21/12 0310 07/21/12 0920 07/21/12 1429 07/21/12 2051 07/22/12 0313  AST 34  --   --   --   --   --   --   ALT 15  --   --   --   --   --   --   ALKPHOS 63  --   --   --   --   --   --   BILITOT 0.6  --   --   --   --   --   --   PROT 6.8  --   --   --   --   --   --   ALBUMIN 2.2*  < > 1.9* 2.3* 2.0* 1.6* 1.5*  < > = values in this interval not displayed. No results found for this basename: LIPASE, AMYLASE,  in the last 168 hours No results found for this basename: AMMONIA,  in the last 168 hours CBC:  Recent Labs Lab 07/20/12 1255 07/21/12 0310 07/22/12 0313  WBC 11.1* 9.9 10.4  HGB 12.4* 11.0* 10.3*  HCT 36.8* 33.2* 30.8*  MCV 80.7 80.8 81.7  PLT 256 256 199   Cardiac Enzymes:  Recent Labs Lab 07/20/12 1255 07/20/12 2155 07/21/12 0310 07/21/12 0920  TROPONINI <0.30 <0.30 <0.30 <0.30   BNP (last 3 results)  Recent Labs  07/20/12 2155  PROBNP 7119.0*   CBG: No results found for this basename: GLUCAP,  in the last 168 hours  Recent Results (from the past 240 hour(s))  MRSA PCR SCREENING     Status: Abnormal   Collection Time    07/20/12 10:01 PM      Result Value Range  Status   MRSA by PCR POSITIVE (*) NEGATIVE Final   Comment:            The GeneXpert MRSA Assay (FDA     approved for NASAL specimens     only), is one component of a  comprehensive MRSA colonization     surveillance program. It is not     intended to diagnose MRSA     infection nor to guide or     monitor treatment for     MRSA infections.     RESULT CALLED TO, READ BACK BY AND VERIFIED WITH:     C MITCHELL AT 2336 ON 02.17.2014 BY NBROOKS     Studies: US Renal  07/21/2012  *RADIOLOGY REPORT*  Clinical Data: Acute renal injury.  Rule out obstruction.  RENAL/URINARY TRACT ULTRASOUND COMPLETE  Comparison:  None.  Findings:  Right Kidney:  12.5 cm.  Slight cortical thinning.  No focal abnormality.  Normal echotexture.  No hydronephrosis or visible stones.  Left Kidney:  12.5 cm.  Slight cortical thinning.  Normal echotexture.  No focal abnormality or hydronephrosis.  No visible stones.  Bladder:  Foley catheter in place with bladder decompressed.  IMPRESSION: Slight cortical thinning bilaterally.  No acute findings.   Original Report Authenticated By: Charlett Nose, M.D.    Dg Chest Port 1 View  07/21/2012  *RADIOLOGY REPORT*  Clinical Data: Wheezing and shortness of breath.  PORTABLE CHEST - 1 VIEW  Comparison: 07/20/2012.  Findings: The cardiac silhouette, mediastinal and hilar contours are stable.  Stable prominent right hilar shadow due to a enlarged right pulmonary artery.  Stable emphysematous changes.  No definite acute overlying pulmonary process.  No pleural effusion or pneumothorax.  IMPRESSION:  Stable emphysematous changes.  No definite acute overlying pulmonary process.   Original Report Authenticated By: Rudie Meyer, M.D.    Dg Chest Port 1 View  07/20/2012  *RADIOLOGY REPORT*  Clinical Data: Shortness of breath, hypertension  PORTABLE CHEST - 1 VIEW  Comparison:   06/12/2012 and earlier studies  Findings: Previous median sternotomy.  Enlarged central pulmonary arteries.  Stable  mild cardiomegaly.  Interval improvement in the patchy subsegmental atelectasis or infiltrate seen in the lung bases.  No definite effusion although the lateral costophrenic angles are excluded.  IMPRESSION:  1.  Stable cardiomegaly with resolution of bibasilar infiltrates or atelectasis seen previously.   Original Report Authenticated By: D. Andria Rhein, MD     Scheduled Meds: . bisoprolol  10 mg Oral Daily  . budesonide (PULMICORT) nebulizer solution  0.25 mg Nebulization BID  . Chlorhexidine Gluconate Cloth  6 each Topical q morning - 10a  . dutasteride  0.5 mg Oral Daily  . feeding supplement  1 Container Oral TID BM  . ferrous sulfate  325 mg Oral BID WC  . ipratropium  0.5 mg Nebulization QID  . levalbuterol  0.63 mg Nebulization BID  . magnesium sulfate LVP 250-500 ml  4 g Intravenous Once  . mupirocin ointment  1 application Nasal BID  . mupirocin ointment   Topical BID  . nystatin cream   Topical BID  . pantoprazole  40 mg Oral Daily  . potassium chloride  40 mEq Oral Once  . potassium chloride  40 mEq Oral Once  . saccharomyces boulardii  250 mg Oral BID  . sodium chloride  3 mL Intravenous Q12H  . Warfarin - Pharmacist Dosing Inpatient   Does not apply q1800   Continuous Infusions: . sodium chloride 0.45 % 1,000 mL with sodium bicarbonate 25 mEq infusion 200 mL/hr at 07/22/12 0550    Principal Problem:   Atrial fibrillation with RVR Active Problems:   HYPERLIPIDEMIA   PERIPHERAL NEUROPATHY   HYPERTENSION   CORONARY ARTERY DISEASE   CEREBROVASCULAR ACCIDENT  DYSPHAGIA UNSPECIFIED   GERD (gastroesophageal reflux disease)   COPD (chronic obstructive pulmonary disease)   Hypokalemia   Hypomagnesemia   Hypocalcemia   Acute kidney injury   Chronic diarrhea   Falls frequently   Leukocytosis   Normocytic anemia    Time spent: >30 min    Keoki Mchargue C  [facility] Pager [phone]. If 7PM-7AM, please contact night-coverage at [email or web address], password  [facility] 07/22/2012, 8:51 AM  LOS: 2 days

## 2012-07-22 NOTE — Progress Notes (Signed)
ANTICOAGULATION CONSULT NOTE - Follow up  Pharmacy Consult for Warfarin Indication: AFib  Allergies  Allergen Reactions  . Penicillins     hives   Patient Measurements: Height: 5\' 11"  (180.3 cm) Weight: 142 lb 6.7 oz (64.6 kg) IBW/kg (Calculated) : 75.3  Vital Signs: Temp: 98.6 F (37 C) (02/19 0800) Temp src: Oral (02/19 0800) BP: 112/78 mmHg (02/19 0800) Pulse Rate: 62 (02/19 0800)  Labs:  Recent Labs  07/20/12 1255  07/20/12 2155 07/21/12 0310 07/21/12 0920  07/21/12 2051 07/22/12 0313 07/22/12 0815  HGB 12.4*  --   --  11.0*  --   --   --  10.3*  --   HCT 36.8*  --   --  33.2*  --   --   --  30.8*  --   PLT 256  --   --  256  --   --   --  199  --   LABPROT 26.5*  --   --  25.9*  --   --   --  28.5*  --   INR 2.59*  --   --  2.51*  --   --   --  2.86*  --   CREATININE 3.77*  < > 3.64* 3.49* 3.25*  < > 2.73* 2.39* 2.17*  TROPONINI <0.30  --  <0.30 <0.30 <0.30  --   --   --   --   < > = values in this interval not displayed.  Estimated Creatinine Clearance: 25.6 ml/min (by C-G formula based on Cr of 2.17).  Assessment:  79 yo M with PMH significant for CVA, Afib, upper GIB, and frequent falls, presented to ER 2/17 with FTT, diarrhea. Pt has had poor PO intake. Pt takes warfarin 2.5mg  daily, however, pt hasn't had a dose since 2/10 due to INR = 8 on 2/10.   Cautiously resumed warfarin on 2/18.   INR remains in target range but rose significantly after 1mg  yesterday.  CBC ok. No bleeding reported/documented.  Tol diet.  Follow up long-term anticoagulation plan given hx of upper GIB and frequent falls.  Goal of Therapy:  INR 2-3   Plan:   No warfarin today.  Daily INR and CBC.  Charolotte Eke, PharmD, pager [phone]. 07/22/2012,11:11 AM.

## 2012-07-22 NOTE — Telephone Encounter (Signed)
Will hold in my basket until I see that the pt is d/c'ed

## 2012-07-22 NOTE — Clinical Social Work Note (Signed)
CSW left messages for spouse and daughter. Pt has bed offer at [facility] for when he is ready to d/c. CSW will continue to follow and assist.   Doreen Salvage, LCSW ICU/Stepdown Clinical Social Worker [facility] Cell [phone] Hours 8am-1200pm M-F

## 2012-07-23 ENCOUNTER — Inpatient Hospital Stay (HOSPITAL_COMMUNITY): Payer: Medicare Other

## 2012-07-23 DIAGNOSIS — I5031 Acute diastolic (congestive) heart failure: Secondary | ICD-10-CM

## 2012-07-23 DIAGNOSIS — I5032 Chronic diastolic (congestive) heart failure: Secondary | ICD-10-CM

## 2012-07-23 DIAGNOSIS — I059 Rheumatic mitral valve disease, unspecified: Secondary | ICD-10-CM

## 2012-07-23 DIAGNOSIS — I509 Heart failure, unspecified: Secondary | ICD-10-CM

## 2012-07-23 LAB — TISSUE TRANSGLUTAMINASE, IGA: Tissue Transglutaminase Ab, IgA: 8.4 U/mL (ref <20)

## 2012-07-23 LAB — COMPREHENSIVE METABOLIC PANEL
Albumin: 1.6 g/dL — ABNORMAL LOW (ref 3.5–5.2)
Alkaline Phosphatase: 80 U/L (ref 39–117)
BUN: 18 mg/dL (ref 6–23)
Calcium: 6.6 mg/dL — ABNORMAL LOW (ref 8.4–10.5)
GFR calc Af Amer: 37 mL/min — ABNORMAL LOW (ref >90)
Glucose, Bld: 114 mg/dL — ABNORMAL HIGH (ref 70–99)
Potassium: 3.3 mEq/L — ABNORMAL LOW (ref 3.5–5.1)
Total Protein: 5.4 g/dL — ABNORMAL LOW (ref 6.0–8.3)

## 2012-07-23 LAB — CBC
HCT: 30.1 % — ABNORMAL LOW (ref 39.0–52.0)
Hemoglobin: 10.1 g/dL — ABNORMAL LOW (ref 13.0–17.0)
MCH: 27.5 pg (ref 26.0–34.0)
MCV: 82 fL (ref 78.0–100.0)
RBC: 3.67 MIL/uL — ABNORMAL LOW (ref 4.22–5.81)

## 2012-07-23 LAB — OVA AND PARASITE EXAMINATION: Special Requests: NORMAL

## 2012-07-23 LAB — GLIADIN ANTIBODIES, SERUM: Gliadin IgA: 6.7 U/mL (ref <20)

## 2012-07-23 LAB — IGA: IgA: 459 mg/dL — ABNORMAL HIGH (ref 68–379)

## 2012-07-23 MED ORDER — LOPERAMIDE HCL 2 MG PO CAPS
4.0000 mg | ORAL_CAPSULE | Freq: Two times a day (BID) | ORAL | Status: DC
  Administered 2012-07-23 – 2012-07-27 (×8): 4 mg via ORAL
  Filled 2012-07-23 (×14): qty 2

## 2012-07-23 MED ORDER — POTASSIUM CHLORIDE CRYS ER 20 MEQ PO TBCR
40.0000 meq | EXTENDED_RELEASE_TABLET | ORAL | Status: AC
  Administered 2012-07-23 (×2): 40 meq via ORAL

## 2012-07-23 MED ORDER — LOPERAMIDE HCL 2 MG PO CAPS
2.0000 mg | ORAL_CAPSULE | Freq: Once | ORAL | Status: AC
  Administered 2012-07-23: 2 mg via ORAL

## 2012-07-23 MED ORDER — IPRATROPIUM BROMIDE 0.02 % IN SOLN
0.5000 mg | Freq: Three times a day (TID) | RESPIRATORY_TRACT | Status: DC
  Administered 2012-07-23 – 2012-07-29 (×20): 0.5 mg via RESPIRATORY_TRACT
  Filled 2012-07-23 (×20): qty 2.5

## 2012-07-23 MED ORDER — LEVALBUTEROL HCL 0.63 MG/3ML IN NEBU
0.6300 mg | INHALATION_SOLUTION | Freq: Three times a day (TID) | RESPIRATORY_TRACT | Status: DC
  Administered 2012-07-23 – 2012-07-29 (×19): 0.63 mg via RESPIRATORY_TRACT
  Filled 2012-07-23 (×22): qty 3

## 2012-07-23 MED ORDER — WARFARIN SODIUM 1 MG PO TABS
1.0000 mg | ORAL_TABLET | Freq: Once | ORAL | Status: AC
  Administered 2012-07-23: 1 mg via ORAL
  Filled 2012-07-23: qty 1

## 2012-07-23 NOTE — Progress Notes (Signed)
SUBJECTIVE: No new complaints.   BP 98/69  Pulse 72  Temp(Src) 98.2 F (36.8 C) (Oral)  Resp 16  Ht 5\' 11"  (1.803 m)  Wt 156 lb 15.5 oz (71.2 kg)  BMI 21.9 kg/m2  SpO2 100%  Intake/Output Summary (Last 24 hours) at 07/23/12 4540 Last data filed at 07/23/12 0600  Gross per 24 hour  Intake 703.19 ml  Output   3025 ml  Net -2321.81 ml    PHYSICAL EXAM General: Thin, elderly male. No acute distress. Alert and oriented x 3.  Psych:  Good affect, responds appropriately Neck: No JVD. No masses noted.  Lungs: Clear bilaterally with no wheezes or rhonci noted.  Heart: irreg irreg  Abdomen: Bowel sounds are present. Soft, non-tender.  Extremities: No lower extremity edema.   LABS: Basic Metabolic Panel:  Recent Labs  98/11/91 1255  07/21/12 0310  07/22/12 0815 07/22/12 2145 07/23/12 0340  NA 137  < > 136  < > 136 140 140  K <2.0*  < > 2.8*  < > 3.5 3.3* 3.3*  CL 94*  < > 97  < > 105 108 109  CO2 22  < > 22  < > 19 21 21   GLUCOSE 131*  < > 122*  < > 99 130* 114*  BUN 27*  < > 27*  < > 20 18 18   CREATININE 3.77*  < > 3.49*  < > 2.17* 2.00* 1.91*  CALCIUM 5.1*  < > 6.0*  < > 6.3* 6.6* 6.6*  MG 0.6*  --  2.1  --   --   --   --   PHOS 5.0*  < > 4.7*  < > 2.5 2.5  --   < > = values in this interval not displayed. CBC:  Recent Labs  07/22/12 0313 07/23/12 0340  WBC 10.4 9.5  HGB 10.3* 10.1*  HCT 30.8* 30.1*  MCV 81.7 82.0  PLT 199 204   Cardiac Enzymes:  Recent Labs  07/20/12 2155 07/21/12 0310 07/21/12 0920  TROPONINI <0.30 <0.30 <0.30   Fasting Lipid Panel: No results found for this basename: CHOL, HDL, LDLCALC, TRIG, CHOLHDL, LDLDIRECT,  in the last 72 hours  Current Meds: . bisoprolol  10 mg Oral Daily  . budesonide (PULMICORT) nebulizer solution  0.25 mg Nebulization BID  . Chlorhexidine Gluconate Cloth  6 each Topical q morning - 10a  . dutasteride  0.5 mg Oral Daily  . feeding supplement  1 Container Oral TID BM  . ferrous sulfate  325 mg  Oral BID WC  . furosemide  20 mg Oral Daily  . ipratropium  0.5 mg Nebulization TID  . levalbuterol  0.63 mg Nebulization TID  . loperamide  2 mg Oral BID AC  . mupirocin ointment  1 application Nasal BID  . mupirocin ointment   Topical BID  . nystatin cream   Topical BID  . pantoprazole  40 mg Oral Daily  . potassium chloride  40 mEq Oral Once  . saccharomyces boulardii  250 mg Oral BID  . sodium chloride  3 mL Intravenous Q12H  . Warfarin - Pharmacist Dosing Inpatient   Does not apply q1800     ASSESSMENT AND PLAN:  1. Atrial fibrillation with RVR: Chronic atrial fib on coumadin. Elevated rates in setting of acute illness. Rate has been controlled overnight but now in 120s. Would continue diltiazem drip today and titrate up as needed for HR as BP tolerates.  He remains on coumadin.  2. Acute on chronic diastolic CHF: Volume status better after IV Lasix. Continue home dose of po Lasix. Aggressive replacement of potassium. Will arrange repeat echo today to assess LV function. CXR pending this am.   3. Acute renal insufficiency: Improving.   [name]  2/20/20146:34 AM

## 2012-07-23 NOTE — Progress Notes (Signed)
ANTICOAGULATION CONSULT NOTE - Follow up  Pharmacy Consult for Warfarin Indication: AFib  Allergies  Allergen Reactions  . Penicillins     hives   Patient Measurements: Height: 5\' 11"  (180.3 cm) Weight: 156 lb 15.5 oz (71.2 kg) IBW/kg (Calculated) : 75.3  Vital Signs: Temp: 98.3 F (36.8 C) (02/20 0800) Temp src: Oral (02/20 0800) BP: 108/80 mmHg (02/20 0800) Pulse Rate: 98 (02/20 0600)  Labs:  Recent Labs  07/20/12 2155 07/21/12 0310 07/21/12 0920  07/22/12 0313 07/22/12 0815 07/22/12 2145 07/23/12 0340  HGB  --  11.0*  --   --  10.3*  --   --  10.1*  HCT  --  33.2*  --   --  30.8*  --   --  30.1*  PLT  --  256  --   --  199  --   --  204  LABPROT  --  25.9*  --   --  28.5*  --   --  28.1*  INR  --  2.51*  --   --  2.86*  --   --  2.80*  CREATININE 3.64* 3.49* 3.25*  < > 2.39* 2.17* 2.00* 1.91*  TROPONINI <0.30 <0.30 <0.30  --   --   --   --   --   < > = values in this interval not displayed.  Estimated Creatinine Clearance: 32.1 ml/min (by C-G formula based on Cr of 1.91).  Assessment:  78 yo M with PMH significant for CVA, Afib, upper GIB, and frequent falls, presented to ER 2/17 with FTT, diarrhea. Pt has had poor PO intake. Pt takes warfarin 2.5mg  daily, however, pt hasn't had a dose since 2/10 due to INR = 8 on 2/10.   Cautiously resumed warfarin on 2/18.   INR remains in target range and leveled-off after holding yesterday.  CBC ok. No bleeding reported/documented.  Tol diet.  Goal of Therapy:  INR 2-3   Plan:   Give warfarin 1mg  today.  Daily INR and CBC.  Charolotte Eke, PharmD, pager [phone]. 07/23/2012,10:09 AM.

## 2012-07-23 NOTE — Progress Notes (Signed)
Occupational Therapy Treatment Patient Details Name: [name] MRN: [id number] DOB: [birth year] Today's Date: 07/23/2012 Time: 8657-8469 OT Time Calculation (min): 29 min  OT Assessment / Plan / Recommendation Comments on Treatment Session Pt tolerated more activity than at eval. HR remains tachy, fluctuating between 115-142 during session.     Follow Up Recommendations  SNF    Barriers to Discharge       Equipment Recommendations  Hospital bed    Recommendations for Other Services    Frequency Min 2X/week   Plan Discharge plan remains appropriate    Precautions / Restrictions Precautions Precautions: Fall Precaution Comments: monitor HR   Pertinent Vitals/Pain Pt did not appear to be in pain or distress.    ADL  Grooming: Minimal assistance Where Assessed - Grooming: Unsupported sitting Toilet Transfer: +2 Total assistance Toilet Transfer: Patient Percentage: 50% Toilet Transfer Method: Stand pivot Toilet Transfer Equipment: Other (comment) (to recliner) ADL Comments: Pt ambulated around the bed to sit in chair. MAX multimodal cues for safe manipulation of RW. Pt has tendency to abandon it, turn it sideways or backwards when turning to sit. Pt sat in chair for several minutes and per RN request, assisted pt back to bed 2* several attempts to get oob unassisted. Continues to fatigue very quickly.    OT Diagnosis:    OT Problem List:   OT Treatment Interventions:     OT Goals ADL Goals ADL Goal: Grooming - Progress: Progressing toward goals ADL Goal: Toilet Transfer - Progress: Progressing toward goals ADL Goal: Additional Goal #1 - Progress: Progressing toward goals  Visit Information  Last OT Received On: 07/23/12 Assistance Needed:  (for safety and lines.) PT/OT Co-Evaluation/Treatment: Yes    Subjective Data  Subjective: I'm just sick and tired...   Prior Functioning       Cognition  Cognition Overall Cognitive Status: Impaired Area of Impairment:  Safety/judgement;Awareness of errors;Awareness of deficits Arousal/Alertness: Awake/alert Orientation Level: Appears intact for tasks assessed Behavior During Session: [facility] for tasks performed Safety/Judgement: Decreased awareness of safety precautions;Decreased safety judgement for tasks assessed;Impulsive;Decreased awareness of need for assistance Awareness of Errors: Assistance required to identify errors made;Assistance required to correct errors made    Mobility  Bed Mobility Bed Mobility: Supine to Sit;Sit to Supine Supine to Sit: 3: Mod assist Sit to Supine: 4: Min assist Details for Bed Mobility Assistance: Pt required physical A for trunk to get oob. Max cues for hand placement and safety. Transfers Sit to Stand: 4: Min assist;With upper extremity assist;From bed;From chair/3-in-1 Stand to Sit: 4: Min assist;With upper extremity assist;To bed;To chair/3-in-1 Details for Transfer Assistance: Max cues for hand placement and safety. +2 for lines and leads.    Exercises      Balance Static Sitting Balance Static Sitting - Balance Support: No upper extremity supported;Feet supported Static Sitting - Level of Assistance: 5: Stand by assistance   End of Session OT - End of Session Activity Tolerance: Patient limited by fatigue Patient left: in bed;with call bell/phone within reach;with bed alarm set  GO     Georgia Baria A OTR/L [phone] 07/23/2012, 9:43 AM

## 2012-07-23 NOTE — Progress Notes (Addendum)
[facility] PROGRESS NOTE  [name] ZOX:[id number] DOB: [birth year] DOA: 07/20/2012 PCP: Kristian Covey, MD  Assessment/Plan  Atrial fibrillation with RVR, likely related to electrolyte derangement and dehydration.  Pt has failed previous attempts at cardioversion.  - cardizem drip started per cards 2/19and  rate better controlled over night, but elevated with PT this am  -appreciate cardiology input - continuing iv cardizem today -per cards continue coumadin  Hypokalemia -again Replete potassium, 2/2 to GI losses and diarrhea  -follow and recheck  Hypomagnesemia -  Resolved, mg 2.1 on 2/18  Hypocalcemia, severe, improved - was Repleted with PO and IV calcium carbonate/gluconate  -his corrected calcium today is 8.4 (for albumin of 1.7) 2/19, and remains wnl today  Acute kidney injury, likely a combination of prerenal azotemia and ATN from chronic dehydration. - FENa < 1%   - Urinalysis with small protein - Renal US neg for acute findings -IVF decreased and lasix restarted 2/19 2/2 vol overload, monitor renal fuction closley on lasix  Chronic diarrhea, unclear etiology, none so far.   - d/c enteric precautions - C diff neg, Stool culture, O&P, Fecal lactoferrin, Occult stool pending - appreciate GI input, follow up on celiac studies - pt now on scheduled antidiarrheal per gi, follow  Leukocytosis, mild(11.1) and likely related to dehydration, electrolyte disturbance.  Resolved - follow CXRs likely more c/w CHF,  UA neg  - c. DIFF neg Normocytic anemia, hgb at baseline - Tx for hgb < 7   Lung infiltratesbibasilar -persistent per f/u CXR today, more likely acute diastolic CHF given elevated BNP and peripheral edema. Less likely PNA as he is afebrile, no cough and no leukoytosis at this time -agree with echo today - continue outpt lasix -monitor renal function closely on lasix  CAD/HTN/HLD:  - BP stable on cardizem drip - Pt not on aspirin due to falls risk  in the setting of warfarin   COPD (chronic obstructive pulmonary disease),  - Continue ipratropium and xopenex-prn xopenex as well  - Continue budesonide   Peripheral neuropathy: Stable  - Hold gabapentin in setting of AKI   GERD (gastroesophageal reflux disease): Stable. Continue protonix  Falls frequently- may be due to muscle weakness from electrolyte disturbance - Continue correcting electrolytes  - PT/OT  Recommending SNF   Depression, uncontrolled and may be contributing to poor appetitie: follow and resume remeron as cr improves  Protein calorie malnutrition/weight loss. May be due to underlying malignancy, depression, diarrhea and poor appetite.    Proph: Therapeutic warfarin   Code Status: NO chest compressions or shocks if heart stops, NO intubation. Would want vasopressors and central line or cardioversion if necessary.  Family Communication: daughter at bedside  Disposition Plan:  Monitor in ICU today, follow and transfer to tele when HR better controlled   Consultants:  Cardiology  GI  Procedures:  None  Antibiotics:  None   HPI/Subjective: Pt  chest pain. Per nsg had 2 diarrheal stools so far this am. He states breathing about the same Objective: Filed Vitals:   07/23/12 0300 07/23/12 0400 07/23/12 0500 07/23/12 0600  BP:  98/69  111/67  Pulse: 101 72  98  Temp:  98.2 F (36.8 C)    TempSrc:  Oral    Resp: 20 16  19   Height:      Weight:   71.2 kg (156 lb 15.5 oz)   SpO2: 98% 100%  97%    Intake/Output Summary (Last 24 hours) at 07/23/12 8119 Last data filed  at 07/23/12 0600  Gross per 24 hour  Intake 679.79 ml  Output   2925 ml  Net -2245.21 ml   Filed Weights   07/20/12 2058 07/23/12 0500  Weight: 64.6 kg (142 lb 6.7 oz) 71.2 kg (156 lb 15.5 oz)    Exam:   General:  Elderly male in no acute distress   HEENT:  NCAT, MMM  Cardiovascular:  Irreg, irreg,  Tachycardic  . Respiratory:  Occasional wheeze, no rhonchi,  No accessory  muscle use  Abdomen:  Hyperactive BS, soft, ND/NT  MSK:   1+ lower extremity edema, no cyanosis Neuro:  Moves all extremities grossly, nonfocal Data Reviewed: Basic Metabolic Panel:  Recent Labs Lab 07/20/12 1255  07/21/12 0310  07/21/12 1429 07/21/12 2051 07/22/12 0313 07/22/12 0815 07/22/12 2145 07/23/12 0340  NA 137  < > 136  < > 136 137 136 136 140 140  K <2.0*  < > 2.8*  < > 3.6 3.2* 3.2* 3.5 3.3* 3.3*  CL 94*  < > 97  < > 102 103 103 105 108 109  CO2 22  < > 22  < > 17* 19 19 19 21 21   GLUCOSE 131*  < > 122*  < > 136* 130* 104* 99 130* 114*  BUN 27*  < > 27*  < > 26* 24* 21 20 18 18   CREATININE 3.77*  < > 3.49*  < > 3.02* 2.73* 2.39* 2.17* 2.00* 1.91*  CALCIUM 5.1*  < > 6.0*  < > 6.0* 6.0* 5.9* 6.3* 6.6* 6.6*  MG 0.6*  --  2.1  --   --   --   --   --   --   --   PHOS 5.0*  < > 4.7*  < > 3.4 3.3 2.9 2.5 2.5  --   < > = values in this interval not displayed. Liver Function Tests:  Recent Labs Lab 07/20/12 1255  07/21/12 2051 07/22/12 0313 07/22/12 0815 07/22/12 2145 07/23/12 0340  AST 34  --   --   --   --   --  20  ALT 15  --   --   --   --   --  12  ALKPHOS 63  --   --   --   --   --  80  BILITOT 0.6  --   --   --   --   --  0.5  PROT 6.8  --   --   --   --   --  5.4*  ALBUMIN 2.2*  < > 1.6* 1.5* 1.6* 1.7* 1.6*  < > = values in this interval not displayed. No results found for this basename: LIPASE, AMYLASE,  in the last 168 hours No results found for this basename: AMMONIA,  in the last 168 hours CBC:  Recent Labs Lab 07/20/12 1255 07/21/12 0310 07/22/12 0313 07/23/12 0340  WBC 11.1* 9.9 10.4 9.5  HGB 12.4* 11.0* 10.3* 10.1*  HCT 36.8* 33.2* 30.8* 30.1*  MCV 80.7 80.8 81.7 82.0  PLT 256 256 199 204   Cardiac Enzymes:  Recent Labs Lab 07/20/12 1255 07/20/12 2155 07/21/12 0310 07/21/12 0920  TROPONINI <0.30 <0.30 <0.30 <0.30   BNP (last 3 results)  Recent Labs  07/20/12 2155 07/23/12 0340  PROBNP 7119.0* 13477.0*   CBG: No results  found for this basename: GLUCAP,  in the last 168 hours  Recent Results (from the past 240 hour(s))  MRSA PCR SCREENING  Status: Abnormal   Collection Time    07/20/12 10:01 PM      Result Value Range Status   MRSA by PCR POSITIVE (*) NEGATIVE Final   Comment:            The GeneXpert MRSA Assay (FDA     approved for NASAL specimens     only), is one component of a     comprehensive MRSA colonization     surveillance program. It is not     intended to diagnose MRSA     infection nor to guide or     monitor treatment for     MRSA infections.     RESULT CALLED TO, READ BACK BY AND VERIFIED WITH:     C MITCHELL AT 2336 ON 02.17.2014 BY NBROOKS  CLOSTRIDIUM DIFFICILE BY PCR     Status: None   Collection Time    07/21/12 10:57 PM      Result Value Range Status   C difficile by pcr NEGATIVE  NEGATIVE Final  STOOL CULTURE     Status: None   Collection Time    07/22/12  6:25 AM      Result Value Range Status   Specimen Description STOOL   Final   Special Requests Normal   Final   Culture Culture reincubated for better growth   Final   Report Status PENDING   Incomplete     Studies: US Renal  07/21/2012  *RADIOLOGY REPORT*  Clinical Data: Acute renal injury.  Rule out obstruction.  RENAL/URINARY TRACT ULTRASOUND COMPLETE  Comparison:  None.  Findings:  Right Kidney:  12.5 cm.  Slight cortical thinning.  No focal abnormality.  Normal echotexture.  No hydronephrosis or visible stones.  Left Kidney:  12.5 cm.  Slight cortical thinning.  Normal echotexture.  No focal abnormality or hydronephrosis.  No visible stones.  Bladder:  Foley catheter in place with bladder decompressed.  IMPRESSION: Slight cortical thinning bilaterally.  No acute findings.   Original Report Authenticated By: Charlett Nose, M.D.    Dg Chest Port 1 View  07/23/2012  *RADIOLOGY REPORT*  Clinical Data: Follow-up evaluation of infiltrates.  PORTABLE CHEST - 1 VIEW  Comparison: Chest x-ray 07/22/2012.  Findings:  Extensive bibasilar opacities are again noted, which may reflect areas of atelectasis and/or consolidation.  Blunting of both of the costophrenic sulci, suggestive of a small bilateral pleural effusions.  Pulmonary venous congestion, without frank pulmonary edema.  Marked enlargement of the central pulmonary arteries suggestive of pulmonary arterial hypertension. Cardiomegaly. The patient is rotated to the right on today's exam, resulting in distortion of the mediastinal contours and reduced diagnostic sensitivity and specificity for mediastinal pathology. Atherosclerosis in the thoracic aorta.  Status post median sternotomy.  IMPRESSION: 1.  Persistent bibasilar opacities which may reflect areas of atelectasis and/or consolidation, likely with small bilateral pleural effusions. 2.  Cardiomegaly and marked dilatation of the central pulmonary arteries (suggestive of pulmonary arterial hypertension) redemonstrated. 3.  Atherosclerosis.   Original Report Authenticated By: Trudie Reed, M.D.    Dg Chest Port 1 View  07/22/2012  *RADIOLOGY REPORT*  Clinical Data: Shortness of breath  PORTABLE CHEST - 1 VIEW  Comparison: Portable chest x-ray of 07/21/2012  Findings: There is opacity in both lung bases probably representing atelectasis although pneumonia cannot be excluded.  There is elevation of the left hemidiaphragm and there does appear to be a large hiatal hernia as demonstrated previously.  Prominent pulmonary artery segments appear stable as is  cardiomegaly.  Median sternotomy sutures are noted.  IMPRESSION:  1.  Bibasilar opacities right greater than left most consistent with atelectasis.  Cannot exclude pneumonia. 2.  Stable large hiatal hernia and moderate cardiomegaly.   Original Report Authenticated By: Dwyane Dee, M.D.    Dg Chest Port 1 View  07/21/2012  *RADIOLOGY REPORT*  Clinical Data: Wheezing and shortness of breath.  PORTABLE CHEST - 1 VIEW  Comparison: 07/20/2012.  Findings: The cardiac  silhouette, mediastinal and hilar contours are stable.  Stable prominent right hilar shadow due to a enlarged right pulmonary artery.  Stable emphysematous changes.  No definite acute overlying pulmonary process.  No pleural effusion or pneumothorax.  IMPRESSION:  Stable emphysematous changes.  No definite acute overlying pulmonary process.   Original Report Authenticated By: Rudie Meyer, M.D.     Scheduled Meds: . bisoprolol  10 mg Oral Daily  . budesonide (PULMICORT) nebulizer solution  0.25 mg Nebulization BID  . Chlorhexidine Gluconate Cloth  6 each Topical q morning - 10a  . dutasteride  0.5 mg Oral Daily  . feeding supplement  1 Container Oral TID BM  . ferrous sulfate  325 mg Oral BID WC  . furosemide  20 mg Oral Daily  . ipratropium  0.5 mg Nebulization TID  . levalbuterol  0.63 mg Nebulization TID  . loperamide  2 mg Oral Once  . loperamide  4 mg Oral BID AC  . mupirocin ointment  1 application Nasal BID  . mupirocin ointment   Topical BID  . nystatin cream   Topical BID  . pantoprazole  40 mg Oral Daily  . potassium chloride  40 mEq Oral Once  . saccharomyces boulardii  250 mg Oral BID  . sodium chloride  3 mL Intravenous Q12H  . Warfarin - Pharmacist Dosing Inpatient   Does not apply q1800   Continuous Infusions: . sodium chloride 10 mL/hr at 07/22/12 2000  . diltiazem (CARDIZEM) infusion 5 mg/hr (07/22/12 1900)    Principal Problem:   Atrial fibrillation with RVR Active Problems:   HYPERLIPIDEMIA   PERIPHERAL NEUROPATHY   HYPERTENSION   CORONARY ARTERY DISEASE   CEREBROVASCULAR ACCIDENT   DYSPHAGIA UNSPECIFIED   GERD (gastroesophageal reflux disease)   COPD (chronic obstructive pulmonary disease)   Hypokalemia   Hypomagnesemia   Hypocalcemia   Acute kidney injury   Chronic diarrhea   Falls frequently   Leukocytosis   Normocytic anemia   Acute diastolic CHF (congestive heart failure)    Time spent: 35 min    Everlina Gotts C  [facility] Pager [phone]. If 7PM-7AM, please contact night-coverage at [email or web address], password [facility] 07/23/2012, 8:29 AM  LOS: 3 days

## 2012-07-23 NOTE — Progress Notes (Signed)
Physical Therapy Treatment Patient Details Name: [name] MRN: [id number] DOB: 1932/06/26 Today's Date: 07/23/2012 Time: 4098-1191 PT Time Calculation (min): 28 min  PT Assessment / Plan / Recommendation Comments on Treatment Session  Pt continues to require +2 for safety and line management and require max cues for safety.  Ambulated pt around bed to chair and back due to pt trying to get OOB on his own earlier and HR continues to fluctuate to 140's.  HR up to 140's at max.      Follow Up Recommendations  SNF     Does the patient have the potential to tolerate intense rehabilitation     Barriers to Discharge        Equipment Recommendations  None recommended by PT    Recommendations for Other Services    Frequency Min 3X/week   Plan Discharge plan remains appropriate    Precautions / Restrictions Precautions Precautions: Fall Precaution Comments: monitor HR   Pertinent Vitals/Pain Pt states no pain, however "moans and groans" during session.     Mobility  Bed Mobility Bed Mobility: Supine to Sit;Sit to Supine Supine to Sit: 3: Mod assist Sit to Supine: 4: Min assist Details for Bed Mobility Assistance: Assist for trunk to get into sitting position.  Max cues for using hands on bed instead of reaching for therapist.  Also required some assist with trunk for safety getting into bed.  Transfers Transfers: Sit to Stand;Stand to Sit Sit to Stand: 4: Min assist;With upper extremity assist;From bed;From chair/3-in-1 Stand to Sit: 4: Min assist;With upper extremity assist;To bed;To chair/3-in-1 Details for Transfer Assistance: Max cues for hand placement and safety as pt would not bring RW with him to chair or would pull up on RW.  Ambulation/Gait Ambulation/Gait Assistance: 1: +2 Total assist Ambulation/Gait: Patient Percentage: 70% Ambulation Distance (Feet): 18 Feet (x 2) Assistive device: Rolling walker Ambulation/Gait Assistance Details: Assist to steady throughout  with MAX cues for sequencing/technique with RW.  +2 for safety and line management.  Also provided max cues for safety as pt would turn to sit in chair and not take RW with him.  Gait Pattern: Step-through pattern;Decreased stride length;Trunk flexed Gait velocity: somewhat increased with cues for slower gait speed for safety.     Exercises     PT Diagnosis:    PT Problem List:   PT Treatment Interventions:     PT Goals Acute Rehab PT Goals PT Goal Formulation: With patient Time For Goal Achievement: 08/04/12 Potential to Achieve Goals: Good Pt will go Supine/Side to Sit: Independently PT Goal: Supine/Side to Sit - Progress: Progressing toward goal Pt will go Sit to Supine/Side: Independently PT Goal: Sit to Supine/Side - Progress: Progressing toward goal Pt will go Sit to Stand: with supervision PT Goal: Sit to Stand - Progress: Progressing toward goal Pt will go Stand to Sit: with supervision PT Goal: Stand to Sit - Progress: Progressing toward goal Pt will Transfer Bed to Chair/Chair to Bed: with supervision PT Transfer Goal: Bed to Chair/Chair to Bed - Progress: Progressing toward goal Pt will Ambulate: 16 - 50 feet;with supervision;with rolling walker PT Goal: Ambulate - Progress: Progressing toward goal  Visit Information  Last PT Received On: 07/23/12 Assistance Needed: +2    Subjective Data  Subjective: I don't know what I am.  Patient Stated Goal: agreed to getting up   Cognition  Cognition Overall Cognitive Status: Impaired Area of Impairment: Safety/judgement;Awareness of errors;Awareness of deficits Arousal/Alertness: Awake/alert Orientation Level: Appears intact  for tasks assessed Behavior During Session: [facility] for tasks performed Safety/Judgement: Decreased awareness of safety precautions;Decreased safety judgement for tasks assessed;Impulsive;Decreased awareness of need for assistance Safety/Judgement - Other Comments: Required max cues for using RW.   Awareness of Errors: Assistance required to identify errors made;Assistance required to correct errors made Cognition - Other Comments: Per RN, had tried to get OOB x 2 prior to PT session.     Balance  Balance Balance Assessed: Yes Static Sitting Balance Static Sitting - Balance Support: No upper extremity supported;Feet supported Static Sitting - Level of Assistance: 5: Stand by assistance Static Sitting - Comment/# of Minutes: Pt able to sit at EOB and perform self care activity with no UE support.   End of Session PT - End of Session Equipment Utilized During Treatment: Oxygen Activity Tolerance: Patient limited by fatigue Patient left: in bed;with call bell/phone within reach;with bed alarm set Nurse Communication: Mobility status   GP     Vista Deck 07/23/2012, 10:05 AM

## 2012-07-23 NOTE — Progress Notes (Addendum)
Clarification: DNR status clarified because of admission note stating he is a DNR but he would want vasopressors, central line, and cardioversion if necessary. Spoke with wife concerning DNR status. She does not want vasopressors, central line or cardioversion. She stated, "Ain't gonna come back to a life anyway, Love and let go." She also stated she "hated he had to be going through this." Dr. Suanne Marker called and updated.

## 2012-07-23 NOTE — Progress Notes (Signed)
  Echocardiogram 2D Echocardiogram has been performed.  Abdoulie Tierce 07/23/2012, 10:06 AM

## 2012-07-23 NOTE — Progress Notes (Signed)
[facility] Progress Note    Since last GI note: Chronic diarrhea "not worse lately" per patient.  He has not been taking anything for it at home, I started him on imodium 1 pill bid yesterday.  Had one very loose stool overnight and another this AM.  Objective: Vital signs in last 24 hours: Temp:  [97.6 F (36.4 C)-98.8 F (37.1 C)] 98.2 F (36.8 C) (02/20 0400) Pulse Rate:  [26-117] 98 (02/20 0600) Resp:  [14-24] 19 (02/20 0600) BP: (90-139)/(57-96) 111/67 mmHg (02/20 0600) SpO2:  [70 %-100 %] 97 % (02/20 0600) Weight:  [156 lb 15.5 oz (71.2 kg)] 156 lb 15.5 oz (71.2 kg) (02/20 0500) Last BM Date: 07/23/12 General: alert and oriented times 3 Heart: regular rate and rythm Abdomen: soft, non-tender, non-distended, normal bowel sounds   Lab Results:  Recent Labs  07/21/12 0310 07/22/12 0313 07/23/12 0340  WBC 9.9 10.4 9.5  HGB 11.0* 10.3* 10.1*  PLT 256 199 204  MCV 80.8 81.7 82.0    Recent Labs  07/22/12 0815 07/22/12 2145 07/23/12 0340  NA 136 140 140  K 3.5 3.3* 3.3*  CL 105 108 109  CO2 19 21 21   GLUCOSE 99 130* 114*  BUN 20 18 18   CREATININE 2.17* 2.00* 1.91*  CALCIUM 6.3* 6.6* 6.6*    Recent Labs  07/20/12 1255  07/22/12 0815 07/22/12 2145 07/23/12 0340  PROT 6.8  --   --   --  5.4*  ALBUMIN 2.2*  < > 1.6* 1.7* 1.6*  AST 34  --   --   --  20  ALT 15  --   --   --  12  ALKPHOS 63  --   --   --  80  BILITOT 0.6  --   --   --  0.5  < > = values in this interval not displayed.  Recent Labs  07/22/12 0313 07/23/12 0340  INR 2.86* 2.80*     Studies/Results: US Renal  07/21/2012  *RADIOLOGY REPORT*  Clinical Data: Acute renal injury.  Rule out obstruction.  RENAL/URINARY TRACT ULTRASOUND COMPLETE  Comparison:  None.  Findings:  Right Kidney:  12.5 cm.  Slight cortical thinning.  No focal abnormality.  Normal echotexture.  No hydronephrosis or visible stones.  Left Kidney:  12.5 cm.  Slight cortical thinning.  Normal echotexture.   No focal abnormality or hydronephrosis.  No visible stones.  Bladder:  Foley catheter in place with bladder decompressed.  IMPRESSION: Slight cortical thinning bilaterally.  No acute findings.   Original Report Authenticated By: Charlett Nose, M.D.    Dg Chest Port 1 View  07/23/2012  *RADIOLOGY REPORT*  Clinical Data: Follow-up evaluation of infiltrates.  PORTABLE CHEST - 1 VIEW  Comparison: Chest x-ray 07/22/2012.  Findings: Extensive bibasilar opacities are again noted, which may reflect areas of atelectasis and/or consolidation.  Blunting of both of the costophrenic sulci, suggestive of a small bilateral pleural effusions.  Pulmonary venous congestion, without frank pulmonary edema.  Marked enlargement of the central pulmonary arteries suggestive of pulmonary arterial hypertension. Cardiomegaly. The patient is rotated to the right on today's exam, resulting in distortion of the mediastinal contours and reduced diagnostic sensitivity and specificity for mediastinal pathology. Atherosclerosis in the thoracic aorta.  Status post median sternotomy.  IMPRESSION: 1.  Persistent bibasilar opacities which may reflect areas of atelectasis and/or consolidation, likely with small bilateral pleural effusions. 2.  Cardiomegaly and marked dilatation of the central pulmonary arteries (suggestive of pulmonary arterial  hypertension) redemonstrated. 3.  Atherosclerosis.   Original Report Authenticated By: Trudie Reed, M.D.    Dg Chest Port 1 View  07/22/2012  *RADIOLOGY REPORT*  Clinical Data: Shortness of breath  PORTABLE CHEST - 1 VIEW  Comparison: Portable chest x-ray of 07/21/2012  Findings: There is opacity in both lung bases probably representing atelectasis although pneumonia cannot be excluded.  There is elevation of the left hemidiaphragm and there does appear to be a large hiatal hernia as demonstrated previously.  Prominent pulmonary artery segments appear stable as is cardiomegaly.  Median sternotomy sutures  are noted.  IMPRESSION:  1.  Bibasilar opacities right greater than left most consistent with atelectasis.  Cannot exclude pneumonia. 2.  Stable large hiatal hernia and moderate cardiomegaly.   Original Report Authenticated By: Dwyane Dee, M.D.    Dg Chest Port 1 View  07/21/2012  *RADIOLOGY REPORT*  Clinical Data: Wheezing and shortness of breath.  PORTABLE CHEST - 1 VIEW  Comparison: 07/20/2012.  Findings: The cardiac silhouette, mediastinal and hilar contours are stable.  Stable prominent right hilar shadow due to a enlarged right pulmonary artery.  Stable emphysematous changes.  No definite acute overlying pulmonary process.  No pleural effusion or pneumothorax.  IMPRESSION:  Stable emphysematous changes.  No definite acute overlying pulmonary process.   Original Report Authenticated By: Rudie Meyer, M.D.      Medications: Scheduled Meds: . bisoprolol  10 mg Oral Daily  . budesonide (PULMICORT) nebulizer solution  0.25 mg Nebulization BID  . Chlorhexidine Gluconate Cloth  6 each Topical q morning - 10a  . dutasteride  0.5 mg Oral Daily  . feeding supplement  1 Container Oral TID BM  . ferrous sulfate  325 mg Oral BID WC  . furosemide  20 mg Oral Daily  . ipratropium  0.5 mg Nebulization TID  . levalbuterol  0.63 mg Nebulization TID  . loperamide  4 mg Oral BID AC  . mupirocin ointment  1 application Nasal BID  . mupirocin ointment   Topical BID  . nystatin cream   Topical BID  . pantoprazole  40 mg Oral Daily  . potassium chloride  40 mEq Oral Once  . saccharomyces boulardii  250 mg Oral BID  . sodium chloride  3 mL Intravenous Q12H  . Warfarin - Pharmacist Dosing Inpatient   Does not apply q1800   Continuous Infusions: . sodium chloride 10 mL/hr at 07/22/12 2000  . diltiazem (CARDIZEM) infusion 5 mg/hr (07/22/12 1900)   PRN Meds:.acetaminophen, acetaminophen, antiseptic oral rinse, levalbuterol, metoprolol    Assessment/Plan: 79 y.o. male with chronic diarrhea  Celiac  panel drawn this AM.  I will double his imodium which I started yesterday (up to 2 pills, twice daily scheduled dosing).  Will follow.  He mentioned a large amount of bowel was resected in past however I see no mention of that in previous office notes Juanda Chance 2008), no op reports in [facility] system either.    Rob Bunting, MD  07/23/2012, 8:12 AM [facility] Pager [phone]

## 2012-07-24 LAB — RETICULIN ANTIBODIES, IGA W TITER

## 2012-07-24 LAB — BASIC METABOLIC PANEL
CO2: 22 mEq/L (ref 19–32)
Calcium: 6.8 mg/dL — ABNORMAL LOW (ref 8.4–10.5)
Chloride: 104 mEq/L (ref 96–112)
Glucose, Bld: 96 mg/dL (ref 70–99)
Potassium: 3.3 mEq/L — ABNORMAL LOW (ref 3.5–5.1)
Sodium: 137 mEq/L (ref 135–145)

## 2012-07-24 LAB — PROTIME-INR: INR: 2.2 — ABNORMAL HIGH (ref 0.00–1.49)

## 2012-07-24 LAB — CBC
HCT: 29.7 % — ABNORMAL LOW (ref 39.0–52.0)
Hemoglobin: 10 g/dL — ABNORMAL LOW (ref 13.0–17.0)
MCV: 82.5 fL (ref 78.0–100.0)
RBC: 3.6 MIL/uL — ABNORMAL LOW (ref 4.22–5.81)
WBC: 8.1 10*3/uL (ref 4.0–10.5)

## 2012-07-24 MED ORDER — WARFARIN SODIUM 1 MG PO TABS
1.0000 mg | ORAL_TABLET | Freq: Once | ORAL | Status: AC
  Administered 2012-07-24: 1 mg via ORAL
  Filled 2012-07-24: qty 1

## 2012-07-24 MED ORDER — AMIODARONE HCL IN DEXTROSE 360-4.14 MG/200ML-% IV SOLN
60.0000 mg/h | INTRAVENOUS | Status: DC
  Administered 2012-07-24 (×2): 60 mg/h via INTRAVENOUS
  Filled 2012-07-24 (×2): qty 200

## 2012-07-24 MED ORDER — AMIODARONE HCL IN DEXTROSE 360-4.14 MG/200ML-% IV SOLN
30.0000 mg/h | INTRAVENOUS | Status: DC
  Administered 2012-07-25: 30 mg/h via INTRAVENOUS
  Filled 2012-07-24: qty 200

## 2012-07-24 MED ORDER — POTASSIUM CHLORIDE CRYS ER 20 MEQ PO TBCR
40.0000 meq | EXTENDED_RELEASE_TABLET | ORAL | Status: AC
  Administered 2012-07-24 (×2): 40 meq via ORAL
  Filled 2012-07-24 (×2): qty 2

## 2012-07-24 NOTE — Clinical Social Work Placement (Signed)
Clinical Social Work Department CLINICAL SOCIAL WORK PLACEMENT NOTE 07/24/2012  Patient:  [name], [name]  Account Number:  [id number] Admit date:  07/20/2012  Clinical Social Worker:  Jodelle Red  Date/time:  07/20/2012 06:17 PM  Clinical Social Work is seeking post-discharge placement for this patient at the following level of care:   SKILLED NURSING   (*CSW will update this form in Epic as items are completed)   07/20/2012  Patient/family provided with [facility] Department of Clinical Social Work's list of facilities offering this level of care within the geographic area requested by the patient (or if unable, by the patient's family).  07/20/2012  Patient/family informed of their freedom to choose among providers that offer the needed level of care, that participate in Medicare, Medicaid or managed care program needed by the patient, have an available bed and are willing to accept the patient.  07/20/2012  Patient/family informed of [facility]' ownership interest in [facility], as well as of the fact that they are under no obligation to receive care at this facility.  PASARR submitted to EDS on 07/23/2012 PASARR number received from EDS on 07/24/2012  FL2 transmitted to all facilities in geographic area requested by pt/family on  07/21/2012 FL2 transmitted to all facilities within larger geographic area on   Patient informed that his/her managed care company has contracts with or will negotiate with  certain facilities, including the following:     Patient/family informed of bed offers received:  07/23/2012 Patient chooses bed at [facility] Physician recommends and patient chooses bed at    Patient to be transferred to [facility] on   Patient to be transferred to facility by   The following physician request were entered in Epic:   Additional Comments:  Doreen Salvage, LCSW ICU/Stepdown Clinical Social  Worker [facility] Cell [phone] Hours 8am-1200pm M-F

## 2012-07-24 NOTE — Progress Notes (Signed)
[facility] Progress Note    Since last GI note: Last BM yesterday during day shift. None overnight per RN and patient. Ate solid breakfast well Celiac panel yesterday was normal.  Objective: Vital signs in last 24 hours: Temp:  [98 F (36.7 C)-98.9 F (37.2 C)] 98.4 F (36.9 C) (02/21 0800) Pulse Rate:  [33-110] 33 (02/21 0800) Resp:  [16-21] 20 (02/21 0800) BP: (81-113)/(56-74) 104/59 mmHg (02/21 0800) SpO2:  [95 %-100 %] 100 % (02/21 0853) Weight:  [160 lb 7.9 oz (72.8 kg)] 160 lb 7.9 oz (72.8 kg) (02/21 0100) Last BM Date: 07/23/12 General: alert and oriented times 3 Heart: regular rate and rythm Abdomen: soft, non-tender, non-distended, normal bowel sounds Chronically ill, weak  Lab Results:  Recent Labs  07/22/12 0313 07/23/12 0340 07/24/12 0352  WBC 10.4 9.5 8.1  HGB 10.3* 10.1* 10.0*  PLT 199 204 208  MCV 81.7 82.0 82.5    Recent Labs  07/22/12 2145 07/23/12 0340 07/24/12 0352  NA 140 140 137  K 3.3* 3.3* 3.3*  CL 108 109 104  CO2 21 21 22   GLUCOSE 130* 114* 96  BUN 18 18 19   CREATININE 2.00* 1.91* 1.92*  CALCIUM 6.6* 6.6* 6.8*    Recent Labs  07/22/12 0815 07/22/12 2145 07/23/12 0340  PROT  --   --  5.4*  ALBUMIN 1.6* 1.7* 1.6*  AST  --   --  20  ALT  --   --  12  ALKPHOS  --   --  80  BILITOT  --   --  0.5    Recent Labs  07/23/12 0340 07/24/12 0352  INR 2.80* 2.20*     Medications: Scheduled Meds: . bisoprolol  10 mg Oral Daily  . budesonide (PULMICORT) nebulizer solution  0.25 mg Nebulization BID  . Chlorhexidine Gluconate Cloth  6 each Topical q morning - 10a  . dutasteride  0.5 mg Oral Daily  . feeding supplement  1 Container Oral TID BM  . ferrous sulfate  325 mg Oral BID WC  . furosemide  20 mg Oral Daily  . ipratropium  0.5 mg Nebulization TID  . levalbuterol  0.63 mg Nebulization TID  . loperamide  4 mg Oral BID AC  . mupirocin ointment  1 application Nasal BID  . mupirocin ointment   Topical BID  .  nystatin cream   Topical BID  . pantoprazole  40 mg Oral Daily  . potassium chloride  40 mEq Oral Once  . potassium chloride  40 mEq Oral Q4H  . saccharomyces boulardii  250 mg Oral BID  . sodium chloride  3 mL Intravenous Q12H  . warfarin  1 mg Oral ONCE-1800  . Warfarin - Pharmacist Dosing Inpatient   Does not apply q1800   Continuous Infusions: . sodium chloride 10 mL/hr at 07/22/12 2000  . amiodarone (NEXTERONE PREMIX) 360 mg/200 mL dextrose 60 mg/hr (07/24/12 0651)   PRN Meds:.acetaminophen, acetaminophen, antiseptic oral rinse, levalbuterol, metoprolol    Assessment/Plan: 79 y.o. male with chronic diarrhea  Diarrhea has improved on scheduled imodium dosing.  He is currently written for 2 pills twice daily, scheduled dosing.  Should continue this indefinitely and only decrease dose or hold dose if he becomes constipated. Since the loose stools are chronic and improve on pretty simple meds, he does not require further GI testing at this point.    Darrell Bunting, MD  07/24/2012, 9:19 AM [facility] Pager [phone]

## 2012-07-24 NOTE — Progress Notes (Signed)
[facility] PROGRESS NOTE  [name] AOZ:[id number] DOB: [birth year] DOA: 07/20/2012 PCP: Kristian Covey, MD  Assessment/Plan  Atrial fibrillation with RVR, likely related to electrolyte derangement and dehydration.  Pt has failed previous attempts at cardioversion.  - cardizem drip started per cards 2/19-2/21, rate controlled initially improved him but today he rates still are poorly controlled plan patient change to IV amiodarone today -continuing coumadin as per cardiology recommendations.  Hypokalemia -again Replete potassium, 2/2 to GI losses and diarrhea  -follow and recheck  Hypomagnesemia -  Resolved, mg 2.1 on 2/18  Hypocalcemia, severe, improved - was Repleted with PO and IV calcium carbonate/gluconate  -his corrected calcium today is 8.4 (for albumin of 1.7) 2/19, and remains wnl today  Acute kidney injury, likely a combination of prerenal azotemia and ATN from chronic dehydration. - FENa < 1%   - Urinalysis with small protein - Renal US neg for acute findings -IVF decreased and lasix restarted 2/19 2/2 vol overload, monitor renal fuction closley on lasix  Chronic diarrhea, unclear etiology, none so far.   - d/c enteric precautions - C diff neg, Stool culture, O&P, Fecal lactoferrin, Occult stool pending - appreciate GI input, gliadin antibodies negative, tissue transglutaminase also negative - on scheduled antidiarrheal per gi diarrhea decreasing, follow  Leukocytosis, mild(11.1) and likely related to dehydration, electrolyte disturbance.  Resolved - follow CXRs likely more c/w CHF,  UA neg  - c. DIFF neg Normocytic anemia, hgb at baseline - Tx for hgb < 7   Lung infiltratesbibasilar -persistent per f/u CXR today, more likely acute diastolic CHF given elevated BNP and peripheral edema. Less likely PNA as he is afebrile, no cough and no leukoytosis at this time -agree with echo today - continue outpt lasix -monitor renal function closely on  lasix  CAD/HTN/HLD:  - BP stable on cardizem drip - Pt not on aspirin due to falls risk in the setting of warfarin   COPD (chronic obstructive pulmonary disease),  - Continue ipratropium and xopenex-prn xopenex as well  - Continue budesonide   Peripheral neuropathy: Stable  - Hold gabapentin in setting of AKI   GERD (gastroesophageal reflux disease): Stable. Continue protonix  Falls frequently- may be due to muscle weakness from electrolyte disturbance - Continue correcting electrolytes  - PT/OT  Recommending SNF   Depression, uncontrolled and may be contributing to poor appetitie: follow and resume remeron as cr improves  Protein calorie malnutrition/weight loss. May be due to underlying malignancy, depression, diarrhea and poor appetite.    Proph: Therapeutic warfarin   Code Status: NO chest compressions or shocks if heart stops, NO intubation. Would want vasopressors and central line or cardioversion if necessary.  Family Communication: daughter at bedside  Disposition Plan:  Monitor in ICU today, follow and transfer to tele when HR better controlled   Consultants:  Cardiology  GI  Procedures:  None  Antibiotics:  None   HPI/Subjective: Pt  chest pain. Per nsg had 2 diarrheal stools so far this am. He states breathing about the same Objective: Filed Vitals:   07/24/12 0000 07/24/12 0100 07/24/12 0400 07/24/12 0800  BP: 89/74  105/56 104/59  Pulse: 105  102 33  Temp: 98.9 F (37.2 C)  98.3 F (36.8 C) 98.4 F (36.9 C)  TempSrc: Oral  Oral Oral  Resp: 20  20 20   Height:      Weight:  72.8 kg (160 lb 7.9 oz)    SpO2: 97%  95% 97%    Intake/Output Summary (  Last 24 hours) at 07/24/12 0851 Last data filed at 07/24/12 0600  Gross per 24 hour  Intake   1265 ml  Output    850 ml  Net    415 ml   Filed Weights   07/20/12 2058 07/23/12 0500 07/24/12 0100  Weight: 64.6 kg (142 lb 6.7 oz) 71.2 kg (156 lb 15.5 oz) 72.8 kg (160 lb 7.9 oz)     Exam:   General:  Elderly male in no acute distress   HEENT:  NCAT, MMM  Cardiovascular:  Irreg, irreg,  Tachycardic  . Respiratory:  Clear to auscultation anteriorly, decreased breath sounds at the bases.  Abdomen:  Hyperactive BS, soft, ND/NT  MSK:   No edema, no cyanosis Neuro:  Moves all extremities grossly, nonfocal Data Reviewed: Basic Metabolic Panel:  Recent Labs Lab 07/20/12 1255  07/21/12 0310  07/21/12 1429 07/21/12 2051 07/22/12 0313 07/22/12 0815 07/22/12 2145 07/23/12 0340 07/24/12 0352  NA 137  < > 136  < > 136 137 136 136 140 140 137  K <2.0*  < > 2.8*  < > 3.6 3.2* 3.2* 3.5 3.3* 3.3* 3.3*  CL 94*  < > 97  < > 102 103 103 105 108 109 104  CO2 22  < > 22  < > 17* 19 19 19 21 21 22   GLUCOSE 131*  < > 122*  < > 136* 130* 104* 99 130* 114* 96  BUN 27*  < > 27*  < > 26* 24* 21 20 18 18 19   CREATININE 3.77*  < > 3.49*  < > 3.02* 2.73* 2.39* 2.17* 2.00* 1.91* 1.92*  CALCIUM 5.1*  < > 6.0*  < > 6.0* 6.0* 5.9* 6.3* 6.6* 6.6* 6.8*  MG 0.6*  --  2.1  --   --   --   --   --   --   --   --   PHOS 5.0*  < > 4.7*  < > 3.4 3.3 2.9 2.5 2.5  --   --   < > = values in this interval not displayed. Liver Function Tests:  Recent Labs Lab 07/20/12 1255  07/21/12 2051 07/22/12 0313 07/22/12 0815 07/22/12 2145 07/23/12 0340  AST 34  --   --   --   --   --  20  ALT 15  --   --   --   --   --  12  ALKPHOS 63  --   --   --   --   --  80  BILITOT 0.6  --   --   --   --   --  0.5  PROT 6.8  --   --   --   --   --  5.4*  ALBUMIN 2.2*  < > 1.6* 1.5* 1.6* 1.7* 1.6*  < > = values in this interval not displayed. No results found for this basename: LIPASE, AMYLASE,  in the last 168 hours No results found for this basename: AMMONIA,  in the last 168 hours CBC:  Recent Labs Lab 07/20/12 1255 07/21/12 0310 07/22/12 0313 07/23/12 0340 07/24/12 0352  WBC 11.1* 9.9 10.4 9.5 8.1  HGB 12.4* 11.0* 10.3* 10.1* 10.0*  HCT 36.8* 33.2* 30.8* 30.1* 29.7*  MCV 80.7 80.8 81.7  82.0 82.5  PLT 256 256 199 204 208   Cardiac Enzymes:  Recent Labs Lab 07/20/12 1255 07/20/12 2155 07/21/12 0310 07/21/12 0920  TROPONINI <0.30 <0.30 <0.30 <0.30   BNP (last  3 results)  Recent Labs  07/20/12 2155 07/23/12 0340  PROBNP 7119.0* 13477.0*   CBG: No results found for this basename: GLUCAP,  in the last 168 hours  Recent Results (from the past 240 hour(s))  MRSA PCR SCREENING     Status: Abnormal   Collection Time    07/20/12 10:01 PM      Result Value Range Status   MRSA by PCR POSITIVE (*) NEGATIVE Final   Comment:            The GeneXpert MRSA Assay (FDA     approved for NASAL specimens     only), is one component of a     comprehensive MRSA colonization     surveillance program. It is not     intended to diagnose MRSA     infection nor to guide or     monitor treatment for     MRSA infections.     RESULT CALLED TO, READ BACK BY AND VERIFIED WITH:     C MITCHELL AT 2336 ON 02.17.2014 BY NBROOKS  CLOSTRIDIUM DIFFICILE BY PCR     Status: None   Collection Time    07/21/12 10:57 PM      Result Value Range Status   C difficile by pcr NEGATIVE  NEGATIVE Final  STOOL CULTURE     Status: None   Collection Time    07/22/12  6:25 AM      Result Value Range Status   Specimen Description STOOL   Final   Special Requests Normal   Final   Culture NO SUSPICIOUS COLONIES, CONTINUING TO HOLD   Final   Report Status PENDING   Incomplete  OVA AND PARASITE EXAMINATION     Status: None   Collection Time    07/22/12  6:25 AM      Result Value Range Status   Specimen Description STOOL   Final   Special Requests Normal   Final   Ova and parasites NO OVA OR PARASITES SEEN MODERATE YEAST   Final   Report Status 07/23/2012 FINAL   Final     Studies: Dg Chest Port 1 View  07/23/2012  *RADIOLOGY REPORT*  Clinical Data: Follow-up evaluation of infiltrates.  PORTABLE CHEST - 1 VIEW  Comparison: Chest x-ray 07/22/2012.  Findings: Extensive bibasilar opacities are  again noted, which may reflect areas of atelectasis and/or consolidation.  Blunting of both of the costophrenic sulci, suggestive of a small bilateral pleural effusions.  Pulmonary venous congestion, without frank pulmonary edema.  Marked enlargement of the central pulmonary arteries suggestive of pulmonary arterial hypertension. Cardiomegaly. The patient is rotated to the right on today's exam, resulting in distortion of the mediastinal contours and reduced diagnostic sensitivity and specificity for mediastinal pathology. Atherosclerosis in the thoracic aorta.  Status post median sternotomy.  IMPRESSION: 1.  Persistent bibasilar opacities which may reflect areas of atelectasis and/or consolidation, likely with small bilateral pleural effusions. 2.  Cardiomegaly and marked dilatation of the central pulmonary arteries (suggestive of pulmonary arterial hypertension) redemonstrated. 3.  Atherosclerosis.   Original Report Authenticated By: Trudie Reed, M.D.    Dg Chest Port 1 View  07/22/2012  *RADIOLOGY REPORT*  Clinical Data: Shortness of breath  PORTABLE CHEST - 1 VIEW  Comparison: Portable chest x-ray of 07/21/2012  Findings: There is opacity in both lung bases probably representing atelectasis although pneumonia cannot be excluded.  There is elevation of the left hemidiaphragm and there does appear to be a large hiatal hernia  as demonstrated previously.  Prominent pulmonary artery segments appear stable as is cardiomegaly.  Median sternotomy sutures are noted.  IMPRESSION:  1.  Bibasilar opacities right greater than left most consistent with atelectasis.  Cannot exclude pneumonia. 2.  Stable large hiatal hernia and moderate cardiomegaly.   Original Report Authenticated By: Dwyane Dee, M.D.     Scheduled Meds: . bisoprolol  10 mg Oral Daily  . budesonide (PULMICORT) nebulizer solution  0.25 mg Nebulization BID  . Chlorhexidine Gluconate Cloth  6 each Topical q morning - 10a  . dutasteride  0.5 mg Oral  Daily  . feeding supplement  1 Container Oral TID BM  . ferrous sulfate  325 mg Oral BID WC  . furosemide  20 mg Oral Daily  . ipratropium  0.5 mg Nebulization TID  . levalbuterol  0.63 mg Nebulization TID  . loperamide  4 mg Oral BID AC  . mupirocin ointment  1 application Nasal BID  . mupirocin ointment   Topical BID  . nystatin cream   Topical BID  . pantoprazole  40 mg Oral Daily  . potassium chloride  40 mEq Oral Once  . saccharomyces boulardii  250 mg Oral BID  . sodium chloride  3 mL Intravenous Q12H  . warfarin  1 mg Oral ONCE-1800  . Warfarin - Pharmacist Dosing Inpatient   Does not apply q1800   Continuous Infusions: . sodium chloride 10 mL/hr at 07/22/12 2000  . amiodarone (NEXTERONE PREMIX) 360 mg/200 mL dextrose 60 mg/hr (07/24/12 0651)    Principal Problem:   Atrial fibrillation with RVR Active Problems:   HYPERLIPIDEMIA   PERIPHERAL NEUROPATHY   HYPERTENSION   CORONARY ARTERY DISEASE   CEREBROVASCULAR ACCIDENT   DYSPHAGIA UNSPECIFIED   GERD (gastroesophageal reflux disease)   COPD (chronic obstructive pulmonary disease)   Hypokalemia   Hypomagnesemia   Hypocalcemia   Acute kidney injury   Chronic diarrhea   Falls frequently   Leukocytosis   Normocytic anemia   Acute diastolic CHF (congestive heart failure)    Time spent: 30 min    Maher Shon C  [facility] Pager [phone]. If 7PM-7AM, please contact night-coverage at [email or web address], password [facility] 07/24/2012, 8:51 AM  LOS: 4 days

## 2012-07-24 NOTE — Progress Notes (Signed)
Nutrition Brief Note  Per RN pt hates the [employer] and is not eating well. Per pt he has "never been much of an eater". Pt refuses supplements and does not want to eat anything offered. Encouraged po intake. Will d/c supplement orders and continue to monitor.  Ian Malkin RD, LDN Inpatient Clinical Dietitian Pager: [phone] After Hours Pager: [phone]

## 2012-07-24 NOTE — Progress Notes (Signed)
ANTICOAGULATION CONSULT NOTE - Follow up  Pharmacy Consult for Warfarin Indication: AFib  Allergies  Allergen Reactions  . Penicillins     hives   Patient Measurements: Height: 5\' 11"  (180.3 cm) Weight: 160 lb 7.9 oz (72.8 kg) IBW/kg (Calculated) : 75.3  Vital Signs: Temp: 98.3 F (36.8 C) (02/21 0400) Temp src: Oral (02/21 0400) BP: 105/56 mmHg (02/21 0400) Pulse Rate: 102 (02/21 0400)  Labs:  Recent Labs  07/21/12 0920  07/22/12 0313  07/22/12 2145 07/23/12 0340 07/24/12 0352  HGB  --   < > 10.3*  --   --  10.1* 10.0*  HCT  --   --  30.8*  --   --  30.1* 29.7*  PLT  --   --  199  --   --  204 208  LABPROT  --   --  28.5*  --   --  28.1* 23.5*  INR  --   --  2.86*  --   --  2.80* 2.20*  CREATININE 3.25*  < > 2.39*  < > 2.00* 1.91* 1.92*  TROPONINI <0.30  --   --   --   --   --   --   < > = values in this interval not displayed.  Estimated Creatinine Clearance: 32.7 ml/min (by C-G formula based on Cr of 1.92).  Assessment:  79 yo M with PMH significant for CVA, Afib, upper GIB, and frequent falls, presented to ER 2/17 with FTT, diarrhea. Pt has had poor PO intake. Pt takes warfarin 2.5mg  daily, however, pt hasn't had a dose since 2/10 due to INR = 8 on 2/10.   Cautiously resumed warfarin on 2/18.   INR remains in target range with conservative dosing and holding dose on 2/19  CBC ok. No bleeding reported/documented.  Tolerating Diet   Goal of Therapy:  INR 2-3   Plan:   Give warfarin 1mg  today.  Daily INR and CBC.  BorgerdingLoma Messing PharmD Pager #: [phone] 8:16 AM 07/24/2012

## 2012-07-24 NOTE — Progress Notes (Signed)
SUBJECTIVE: No complaints.   BP 105/56  Pulse 102  Temp(Src) 98.9 F (37.2 C) (Oral)  Resp 20  Ht 5\' 11"  (1.803 m)  Wt 160 lb 7.9 oz (72.8 kg)  BMI 22.39 kg/m2  SpO2 95%  Intake/Output Summary (Last 24 hours) at 07/24/12 0555 Last data filed at 07/24/12 0500  Gross per 24 hour  Intake   1315 ml  Output   1065 ml  Net    250 ml    PHYSICAL EXAM General: Thin, elderly male. No acute distress. Alert and oriented x 3.  Psych: Good affect, responds appropriately  Neck: No JVD. No masses noted.  Lungs: Clear bilaterally with no wheezes or rhonci noted.  Heart: irreg irreg  Abdomen: Bowel sounds are present. Soft, non-tender.  Extremities: No lower extremity edema.   LABS: Basic Metabolic Panel:  Recent Labs  28/41/32 0815 07/22/12 2145 07/23/12 0340 07/24/12 0352  NA 136 140 140 137  K 3.5 3.3* 3.3* 3.3*  CL 105 108 109 104  CO2 19 21 21 22   GLUCOSE 99 130* 114* 96  BUN 20 18 18 19   CREATININE 2.17* 2.00* 1.91* 1.92*  CALCIUM 6.3* 6.6* 6.6* 6.8*  PHOS 2.5 2.5  --   --    CBC:  Recent Labs  07/23/12 0340 07/24/12 0352  WBC 9.5 8.1  HGB 10.1* 10.0*  HCT 30.1* 29.7*  MCV 82.0 82.5  PLT 204 208   Cardiac Enzymes:  Recent Labs  07/21/12 0920  TROPONINI <0.30   Current Meds: . bisoprolol  10 mg Oral Daily  . budesonide (PULMICORT) nebulizer solution  0.25 mg Nebulization BID  . Chlorhexidine Gluconate Cloth  6 each Topical q morning - 10a  . dutasteride  0.5 mg Oral Daily  . feeding supplement  1 Container Oral TID BM  . ferrous sulfate  325 mg Oral BID WC  . furosemide  20 mg Oral Daily  . ipratropium  0.5 mg Nebulization TID  . levalbuterol  0.63 mg Nebulization TID  . loperamide  4 mg Oral BID AC  . mupirocin ointment  1 application Nasal BID  . mupirocin ointment   Topical BID  . nystatin cream   Topical BID  . pantoprazole  40 mg Oral Daily  . potassium chloride  40 mEq Oral Once  . saccharomyces boulardii  250 mg Oral BID  . sodium  chloride  3 mL Intravenous Q12H  . Warfarin - Pharmacist Dosing Inpatient   Does not apply q1800   Echo 07/23/12:  Left ventricle: The cavity size was normal. Wall thickness was normal. Indeterminant diastolic function (atrial fibrillation). Systolic function was mildly reduced. The estimated ejection fraction was in the range of 45% to 50%. Diffuse hypokinesis. - Aortic valve: There was no stenosis. - Mitral valve: Mildly calcified annulus. Mild regurgitation. - Left atrium: The atrium was severely dilated. - Right ventricle: The cavity size was moderately dilated. Systolic function was mildly reduced. - Right atrium: The atrium was moderately dilated. - Tricuspid valve: Peak RV-RA gradient: 28mm Hg (S). - Pulmonary arteries: PA systolic pressure 34-38 mmHg. - Systemic veins: IVC measured 2.2 cm with normal respirophasic variation, suggesting RA pressure 6-10 mmHg. Impressions:  - The patient was in atrial fibrillation. Normal LV size with mild diffuse hypokinesis, EF 45-50%. Moderately dilated RV with mildly decreased systolic function. Mild pulmonary hypertension.   ASSESSMENT AND PLAN:  1. Atrial fibrillation with RVR: Chronic atrial fib on coumadin, followed in our EP clinic by Dr.  Allred. Elevated rates in setting of acute illness. Rate has been poorly  controlled overnight on IV cardizem, with rate increase overnight to maintain rates in 100-120 range. Cardioversion would likely not be beneficial. Will change to IV amiodarone today with the hope that we can control his rate and then change to po amiodarone over the weekend.  He remains on coumadin.   2. Acute on chronic diastolic CHF: Volume status better after IV Lasix. Continue home dose of po Lasix. Aggressive replacement of potassium.    3. Acute renal insufficiency: Stable.      MCALHANY,CHRISTOPHER  2/21/20145:55 AM

## 2012-07-25 LAB — BASIC METABOLIC PANEL
Chloride: 106 mEq/L (ref 96–112)
GFR calc non Af Amer: 37 mL/min — ABNORMAL LOW (ref >90)
Glucose, Bld: 99 mg/dL (ref 70–99)
Potassium: 4 mEq/L (ref 3.5–5.1)
Sodium: 135 mEq/L (ref 135–145)

## 2012-07-25 LAB — STOOL CULTURE: Special Requests: NORMAL

## 2012-07-25 LAB — CBC
HCT: 31 % — ABNORMAL LOW (ref 39.0–52.0)
Hemoglobin: 10.2 g/dL — ABNORMAL LOW (ref 13.0–17.0)
MCH: 27.3 pg (ref 26.0–34.0)
MCV: 82.9 fL (ref 78.0–100.0)
Platelets: 210 10*3/uL (ref 150–400)
RBC: 3.74 MIL/uL — ABNORMAL LOW (ref 4.22–5.81)
WBC: 7.8 10*3/uL (ref 4.0–10.5)

## 2012-07-25 LAB — GLUCOSE, CAPILLARY

## 2012-07-25 MED ORDER — WARFARIN SODIUM 1 MG PO TABS
1.5000 mg | ORAL_TABLET | Freq: Once | ORAL | Status: AC
  Administered 2012-07-25: 1.5 mg via ORAL
  Filled 2012-07-25: qty 1

## 2012-07-25 MED ORDER — METOPROLOL TARTRATE 25 MG/10 ML ORAL SUSPENSION
12.5000 mg | Freq: Two times a day (BID) | ORAL | Status: DC
  Filled 2012-07-25 (×2): qty 5

## 2012-07-25 MED ORDER — METOPROLOL TARTRATE 12.5 MG HALF TABLET
12.5000 mg | ORAL_TABLET | Freq: Two times a day (BID) | ORAL | Status: DC
  Administered 2012-07-25 (×2): 12.5 mg via ORAL
  Filled 2012-07-25 (×4): qty 1

## 2012-07-25 MED ORDER — AMIODARONE PEDIATRIC ORAL SUSPENSION 5 MG/ML: 200.0000 mg | Freq: Three times a day (TID) | ORAL | Status: DC

## 2012-07-25 MED ORDER — AMIODARONE HCL 200 MG PO TABS
200.0000 mg | ORAL_TABLET | Freq: Three times a day (TID) | ORAL | Status: DC
  Administered 2012-07-25 – 2012-07-29 (×14): 200 mg via ORAL
  Filled 2012-07-25 (×15): qty 1

## 2012-07-25 NOTE — Progress Notes (Signed)
[facility] PROGRESS NOTE  [name] ZOX:[id number] DOB: 1932-12-03 DOA: 07/20/2012 PCP: Kristian Covey, MD  Assessment/Plan  Atrial fibrillation with RVR, likely related to electrolyte derangement and dehydration.  It was noted on admission that Pt had failed previous attempts at cardioversion.  - cardizem drip started per cards 2/19-2/21, rate controlled initially improved him but on follow up the next day his HR rate was still poorly controlled and cardiology changed to IV amiodarone on 2/21 -per cards being changed to oral cardizem today 2/22 -continuing coumadin as per cardiology recommendations, INR therapeutic.  Hypokalemia -resolved, 2/2 to GI losses and diarrhea  -follow and recheck  Hypomagnesemia -  Resolved, mg 2.1 on 2/18  Hypocalcemia, severe, improved - was Repleted with PO and IV calcium carbonate/gluconate  -his corrected calcium today is 8.4 (for albumin of 1.7) 2/19, and remains wnl today  Acute kidney injury, likely a combination of prerenal azotemia and ATN from chronic dehydration. - FENa < 1%   - Urinalysis with small protein - Renal US neg for acute findings -IVF decreased and lasix restarted 2/19 2/2 vol overload -cr trending down, follow  Chronic diarrhea, unclear etiology, none so far.   - d/c enteric precautions - C diff neg, Stool culture, O&P, Fecal lactoferrin, Occult stool pending - appreciate GI input, gliadin antibodies negative, tissue transglutaminase also negative - on scheduled antidiarrheal per gi diarrhea decreasing, follow  Leukocytosis, mild(11.1) and likely related to dehydration, electrolyte disturbance.  Resolved - follow CXRs likely more c/w CHF,  UA neg  - c. DIFF neg Normocytic anemia, hgb at baseline - Tx for hgb < 7   Lung infiltratesbibasilar -persistent per f/u CXR today, more likely acute diastolic CHF given elevated BNP and peripheral edema. Less likely PNA as he is afebrile, no cough and no leukoytosis at  this time -agree with echo today - continue outpt lasix -continue monitoring renal function closely on lasix  CAD/HTN/HLD:  - BP stable on cardizem drip - Pt not on aspirin due to falls risk in the setting of warfarin   COPD (chronic obstructive pulmonary disease),  - Continue ipratropium and xopenex-prn xopenex as well  - Continue budesonide   Peripheral neuropathy: Stable  - Hold gabapentin in setting of AKI   GERD (gastroesophageal reflux disease): Stable. Continue protonix  Falls frequently- may be due to muscle weakness from electrolyte disturbance - Continue correcting electrolytes  - PT/OT  Recommending SNF   Depression, uncontrolled and may be contributing to poor appetitie: follow and resume remeron as cr improves  Protein calorie malnutrition/weight loss. May be due to underlying malignancy, depression, diarrhea and poor appetite.    Proph: Therapeutic warfarin   Code Status: NO chest compressions or shocks if heart stops, NO intubation. Would want vasopressors and central line or cardioversion if necessary.  Family Communication: daughter at bedside  Disposition Plan:  Monitor in ICU today, follow and transfer to tele when HR better controlled   Consultants:  Cardiology  GI  Procedures:  None  Antibiotics:  None   HPI/Subjective: Pt  Alert and oriented x3, per nsg had 1 large diarrheal BM this am. Denies CP Objective: Filed Vitals:   07/25/12 0000 07/25/12 0200 07/25/12 0400 07/25/12 0849  BP: 100/84  123/72   Pulse: 101  109   Temp: 98.4 F (36.9 C)  97.9 F (36.6 C)   TempSrc: Oral  Axillary   Resp: 20  20   Height:      Weight:  70.6 kg (155 lb  10.3 oz)    SpO2: 99%  99% 97%    Intake/Output Summary (Last 24 hours) at 07/25/12 9604 Last data filed at 07/25/12 0600  Gross per 24 hour  Intake 1390.4 ml  Output    735 ml  Net  655.4 ml   Filed Weights   07/23/12 0500 07/24/12 0100 07/25/12 0200  Weight: 71.2 kg (156 lb 15.5 oz) 72.8  kg (160 lb 7.9 oz) 70.6 kg (155 lb 10.3 oz)    Exam:   General:  Elderly male in no acute distress   HEENT:  NCAT, MMM  Cardiovascular:  Irreg, irreg,  Rate controlled  . Respiratory:  Clear to auscultation anteriorly, decreased breath sounds at the bases.  Abdomen:  Hyperactive BS, soft, ND/NT  MSK:   No edema, no cyanosis Neuro:  Moves all extremities grossly, nonfocal Data Reviewed: Basic Metabolic Panel:  Recent Labs Lab 07/20/12 1255  07/21/12 0310  07/21/12 1429 07/21/12 2051 07/22/12 0313 07/22/12 0815 07/22/12 2145 07/23/12 0340 07/24/12 0352 07/25/12 0335  NA 137  < > 136  < > 136 137 136 136 140 140 137 135  K <2.0*  < > 2.8*  < > 3.6 3.2* 3.2* 3.5 3.3* 3.3* 3.3* 4.0  CL 94*  < > 97  < > 102 103 103 105 108 109 104 106  CO2 22  < > 22  < > 17* 19 19 19 21 21 22 20   GLUCOSE 131*  < > 122*  < > 136* 130* 104* 99 130* 114* 96 99  BUN 27*  < > 27*  < > 26* 24* 21 20 18 18 19 22   CREATININE 3.77*  < > 3.49*  < > 3.02* 2.73* 2.39* 2.17* 2.00* 1.91* 1.92* 1.68*  CALCIUM 5.1*  < > 6.0*  < > 6.0* 6.0* 5.9* 6.3* 6.6* 6.6* 6.8* 7.3*  MG 0.6*  --  2.1  --   --   --   --   --   --   --   --   --   PHOS 5.0*  < > 4.7*  < > 3.4 3.3 2.9 2.5 2.5  --   --   --   < > = values in this interval not displayed. Liver Function Tests:  Recent Labs Lab 07/20/12 1255  07/21/12 2051 07/22/12 0313 07/22/12 0815 07/22/12 2145 07/23/12 0340  AST 34  --   --   --   --   --  20  ALT 15  --   --   --   --   --  12  ALKPHOS 63  --   --   --   --   --  80  BILITOT 0.6  --   --   --   --   --  0.5  PROT 6.8  --   --   --   --   --  5.4*  ALBUMIN 2.2*  < > 1.6* 1.5* 1.6* 1.7* 1.6*  < > = values in this interval not displayed. No results found for this basename: LIPASE, AMYLASE,  in the last 168 hours No results found for this basename: AMMONIA,  in the last 168 hours CBC:  Recent Labs Lab 07/21/12 0310 07/22/12 0313 07/23/12 0340 07/24/12 0352 07/25/12 0335  WBC 9.9 10.4  9.5 8.1 7.8  HGB 11.0* 10.3* 10.1* 10.0* 10.2*  HCT 33.2* 30.8* 30.1* 29.7* 31.0*  MCV 80.8 81.7 82.0 82.5 82.9  PLT 256 199 204  208 210   Cardiac Enzymes:  Recent Labs Lab 07/20/12 1255 07/20/12 2155 07/21/12 0310 07/21/12 0920  TROPONINI <0.30 <0.30 <0.30 <0.30   BNP (last 3 results)  Recent Labs  07/20/12 2155 07/23/12 0340  PROBNP 7119.0* 13477.0*   CBG: No results found for this basename: GLUCAP,  in the last 168 hours  Recent Results (from the past 240 hour(s))  MRSA PCR SCREENING     Status: Abnormal   Collection Time    07/20/12 10:01 PM      Result Value Range Status   MRSA by PCR POSITIVE (*) NEGATIVE Final   Comment:            The GeneXpert MRSA Assay (FDA     approved for NASAL specimens     only), is one component of a     comprehensive MRSA colonization     surveillance program. It is not     intended to diagnose MRSA     infection nor to guide or     monitor treatment for     MRSA infections.     RESULT CALLED TO, READ BACK BY AND VERIFIED WITH:     C MITCHELL AT 2336 ON 02.17.2014 BY NBROOKS  CLOSTRIDIUM DIFFICILE BY PCR     Status: None   Collection Time    07/21/12 10:57 PM      Result Value Range Status   C difficile by pcr NEGATIVE  NEGATIVE Final  STOOL CULTURE     Status: None   Collection Time    07/22/12  6:25 AM      Result Value Range Status   Specimen Description STOOL   Final   Special Requests Normal   Final   Culture NO SUSPICIOUS COLONIES, CONTINUING TO HOLD   Final   Report Status PENDING   Incomplete  OVA AND PARASITE EXAMINATION     Status: None   Collection Time    07/22/12  6:25 AM      Result Value Range Status   Specimen Description STOOL   Final   Special Requests Normal   Final   Ova and parasites NO OVA OR PARASITES SEEN MODERATE YEAST   Final   Report Status 07/23/2012 FINAL   Final     Studies: No results found.  Scheduled Meds: . amiodarone  200 mg Oral TID  . budesonide (PULMICORT) nebulizer  solution  0.25 mg Nebulization BID  . Chlorhexidine Gluconate Cloth  6 each Topical q morning - 10a  . dutasteride  0.5 mg Oral Daily  . ferrous sulfate  325 mg Oral BID WC  . furosemide  20 mg Oral Daily  . ipratropium  0.5 mg Nebulization TID  . levalbuterol  0.63 mg Nebulization TID  . loperamide  4 mg Oral BID AC  . metoprolol tartrate  12.5 mg Oral BID  . mupirocin ointment  1 application Nasal BID  . mupirocin ointment   Topical BID  . nystatin cream   Topical BID  . pantoprazole  40 mg Oral Daily  . potassium chloride  40 mEq Oral Once  . saccharomyces boulardii  250 mg Oral BID  . sodium chloride  3 mL Intravenous Q12H  . Warfarin - Pharmacist Dosing Inpatient   Does not apply q1800   Continuous Infusions: . sodium chloride 10 mL/hr (07/24/12 1513)    Principal Problem:   Atrial fibrillation with RVR Active Problems:   HYPERLIPIDEMIA   PERIPHERAL NEUROPATHY   HYPERTENSION  CORONARY ARTERY DISEASE   CEREBROVASCULAR ACCIDENT   DYSPHAGIA UNSPECIFIED   GERD (gastroesophageal reflux disease)   COPD (chronic obstructive pulmonary disease)   Hypokalemia   Hypomagnesemia   Hypocalcemia   Acute kidney injury   Chronic diarrhea   Falls frequently   Leukocytosis   Normocytic anemia   Acute diastolic CHF (congestive heart failure)    Time spent: 30 min    Deborahann Poteat C  [facility] Pager [phone]. If 7PM-7AM, please contact night-coverage at [email or web address], password [facility] 07/25/2012, 8:52 AM  LOS: 5 days

## 2012-07-25 NOTE — Progress Notes (Signed)
ANTICOAGULATION CONSULT NOTE - Follow up  Pharmacy Consult for Warfarin Indication: AFib  Allergies  Allergen Reactions  . Penicillins     hives   Patient Measurements: Height: 5\' 11"  (180.3 cm) Weight: 155 lb 10.3 oz (70.6 kg) IBW/kg (Calculated) : 75.3  Vital Signs: Temp: 97.3 F (36.3 C) (02/22 0800) Temp src: Axillary (02/22 0800) BP: 112/74 mmHg (02/22 1000) Pulse Rate: 101 (02/22 1000)  Labs:  Recent Labs  07/23/12 0340 07/24/12 0352 07/25/12 0335  HGB 10.1* 10.0* 10.2*  HCT 30.1* 29.7* 31.0*  PLT 204 208 210  LABPROT 28.1* 23.5* 23.0*  INR 2.80* 2.20* 2.14*  CREATININE 1.91* 1.92* 1.68*    Estimated Creatinine Clearance: 36.2 ml/min (by C-G formula based on Cr of 1.68).  Assessment:  79 yo M with PMH significant for CVA, Afib, upper GIB, and frequent falls, presented to ER 2/17 with FTT, diarrhea. Pt has had poor PO intake. Pt takes warfarin 2.5mg  daily, however, pt hasn't had a dose since 2/10 due to INR = 8 on 2/10.   Cautiously resumed warfarin on 2/18.   INR remains in target range with conservative dosing and holding dose on 2/19.  Trending down slightly but still within goal range.   CBC ok. No bleeding reported/documented.  Drug Interaction: Amiodarone, Started 2/21, can increase the affects of INR   Goal of Therapy:  INR 2-3   Plan:   Give warfarin 1.5 mg today.  Daily INR and CBC.  BorgerdingLoma Messing PharmD Pager #: [phone] 12:16 PM 07/25/2012

## 2012-07-25 NOTE — Progress Notes (Signed)
Patient ID: [name], male   DOB: 06-08-1932, 79 y.o.   MRN: [id number]    SUBJECTIVE: No complaints. BM being cleaned by techs  BP 123/72  Pulse 109  Temp(Src) 97.9 F (36.6 C) (Axillary)  Resp 20  Ht 5\' 11"  (1.803 m)  Wt 155 lb 10.3 oz (70.6 kg)  BMI 21.72 kg/m2  SpO2 99%  Intake/Output Summary (Last 24 hours) at 07/25/12 4098 Last data filed at 07/25/12 0600  Gross per 24 hour  Intake 1390.4 ml  Output    735 ml  Net  655.4 ml    PHYSICAL EXAM General: Thin, elderly male. No acute distress. Alert and oriented x 3.  Psych: Good affect, responds appropriately  Neck: No JVD. No masses noted.  Lungs: Clear bilaterally with no wheezes or rhonci noted.  Heart: irreg irreg  Abdomen: Bowel sounds are present. Soft, non-tender.  Extremities: No lower extremity edema.   LABS: Basic Metabolic Panel:  Recent Labs  11/91/47 2145  07/24/12 0352 07/25/12 0335  NA 140  < > 137 135  K 3.3*  < > 3.3* 4.0  CL 108  < > 104 106  CO2 21  < > 22 20  GLUCOSE 130*  < > 96 99  BUN 18  < > 19 22  CREATININE 2.00*  < > 1.92* 1.68*  CALCIUM 6.6*  < > 6.8* 7.3*  PHOS 2.5  --   --   --   < > = values in this interval not displayed. CBC:  Recent Labs  07/24/12 0352 07/25/12 0335  WBC 8.1 7.8  HGB 10.0* 10.2*  HCT 29.7* 31.0*  MCV 82.5 82.9  PLT 208 210   Cardiac Enzymes: No results found for this basename: CKTOTAL, CKMB, CKMBINDEX, TROPONINI,  in the last 72 hours Current Meds: . bisoprolol  10 mg Oral Daily  . budesonide (PULMICORT) nebulizer solution  0.25 mg Nebulization BID  . Chlorhexidine Gluconate Cloth  6 each Topical q morning - 10a  . dutasteride  0.5 mg Oral Daily  . ferrous sulfate  325 mg Oral BID WC  . furosemide  20 mg Oral Daily  . ipratropium  0.5 mg Nebulization TID  . levalbuterol  0.63 mg Nebulization TID  . loperamide  4 mg Oral BID AC  . mupirocin ointment  1 application Nasal BID  . mupirocin ointment   Topical BID  . nystatin cream   Topical  BID  . pantoprazole  40 mg Oral Daily  . potassium chloride  40 mEq Oral Once  . saccharomyces boulardii  250 mg Oral BID  . sodium chloride  3 mL Intravenous Q12H  . Warfarin - Pharmacist Dosing Inpatient   Does not apply q1800   Echo 07/23/12:  Left ventricle: The cavity size was normal. Wall thickness was normal. Indeterminant diastolic function (atrial fibrillation). Systolic function was mildly reduced. The estimated ejection fraction was in the range of 45% to 50%. Diffuse hypokinesis. - Aortic valve: There was no stenosis. - Mitral valve: Mildly calcified annulus. Mild regurgitation. - Left atrium: The atrium was severely dilated. - Right ventricle: The cavity size was moderately dilated. Systolic function was mildly reduced. - Right atrium: The atrium was moderately dilated. - Tricuspid valve: Peak RV-RA gradient: 28mm Hg (S). - Pulmonary arteries: PA systolic pressure 34-38 mmHg. - Systemic veins: IVC measured 2.2 cm with normal respirophasic variation, suggesting RA pressure 6-10 mmHg. Impressions:  - The patient was in atrial fibrillation. Normal LV size with mild  diffuse hypokinesis, EF 45-50%. Moderately dilated RV with mildly decreased systolic function. Mild pulmonary hypertension.   ASSESSMENT AND PLAN:  1. Atrial fibrillation with RVR: D/C iv amiodarone and zybeta  Start PO amiodarone and lopressor  2. Acute on chronic diastolic CHF: Volume status better after IV Lasix. Continue home dose of po Lasix. Replacement of potassium.    3. Acute renal insufficiency: Stable.      [name]  2/22/20148:19 AM

## 2012-07-25 NOTE — Progress Notes (Signed)
Pt triggered bed escape alarm and was found trying to climb out of foot of bed after having been incontinent of  large dark brown liquid stool. Assisted to bedside commode and then back to bed. Pt instructed to call for assistance as needed and to not climb OOB. Pt agreed to call for help. [name]

## 2012-07-26 DIAGNOSIS — K219 Gastro-esophageal reflux disease without esophagitis: Secondary | ICD-10-CM

## 2012-07-26 LAB — CBC
HCT: 31 % — ABNORMAL LOW (ref 39.0–52.0)
Hemoglobin: 10.2 g/dL — ABNORMAL LOW (ref 13.0–17.0)
MCH: 27 pg (ref 26.0–34.0)
RBC: 3.78 MIL/uL — ABNORMAL LOW (ref 4.22–5.81)

## 2012-07-26 LAB — PROTIME-INR
INR: 2.21 — ABNORMAL HIGH (ref 0.00–1.49)
Prothrombin Time: 23.6 seconds — ABNORMAL HIGH (ref 11.6–15.2)

## 2012-07-26 MED ORDER — METOPROLOL TARTRATE 12.5 MG HALF TABLET
12.5000 mg | ORAL_TABLET | Freq: Three times a day (TID) | ORAL | Status: DC
  Administered 2012-07-26 – 2012-07-27 (×5): 12.5 mg via ORAL
  Filled 2012-07-26 (×6): qty 1

## 2012-07-26 MED ORDER — WARFARIN SODIUM 1 MG PO TABS
1.5000 mg | ORAL_TABLET | Freq: Once | ORAL | Status: AC
  Administered 2012-07-26: 1.5 mg via ORAL
  Filled 2012-07-26: qty 1

## 2012-07-26 NOTE — Progress Notes (Signed)
Patient ID: [name], male   DOB: February 09, 1933, 79 y.o.   MRN: [id number]    SUBJECTIVE: No complaints. BM being cleaned by techs  BP 106/63  Pulse 122  Temp(Src) 98.4 F (36.9 C) (Oral)  Resp 19  Ht 5\' 11"  (1.803 m)  Wt 157 lb 13.6 oz (71.6 kg)  BMI 22.03 kg/m2  SpO2 94%  Intake/Output Summary (Last 24 hours) at 07/26/12 1478 Last data filed at 07/26/12 0600  Gross per 24 hour  Intake  833.4 ml  Output   1291 ml  Net -457.6 ml    PHYSICAL EXAM General: Thin, elderly male. No acute distress. Alert and oriented x 3.  Psych: Good affect, responds appropriately  Neck: No JVD. No masses noted.  Lungs: Clear bilaterally with no wheezes or rhonci noted.  Heart: irreg irreg  Abdomen: Bowel sounds are present. Soft, non-tender.  Extremities: No lower extremity edema.   LABS: Basic Metabolic Panel:  Recent Labs  29/56/21 0352 07/25/12 0335  NA 137 135  K 3.3* 4.0  CL 104 106  CO2 22 20  GLUCOSE 96 99  BUN 19 22  CREATININE 1.92* 1.68*  CALCIUM 6.8* 7.3*   CBC:  Recent Labs  07/25/12 0335 07/26/12 0357  WBC 7.8 7.3  HGB 10.2* 10.2*  HCT 31.0* 31.0*  MCV 82.9 82.0  PLT 210 225   Cardiac Enzymes: No results found for this basename: CKTOTAL, CKMB, CKMBINDEX, TROPONINI,  in the last 72 hours Current Meds: . amiodarone  200 mg Oral TID  . budesonide (PULMICORT) nebulizer solution  0.25 mg Nebulization BID  . dutasteride  0.5 mg Oral Daily  . ferrous sulfate  325 mg Oral BID WC  . furosemide  20 mg Oral Daily  . ipratropium  0.5 mg Nebulization TID  . levalbuterol  0.63 mg Nebulization TID  . loperamide  4 mg Oral BID AC  . metoprolol tartrate  12.5 mg Oral BID  . mupirocin ointment   Topical BID  . nystatin cream   Topical BID  . pantoprazole  40 mg Oral Daily  . potassium chloride  40 mEq Oral Once  . saccharomyces boulardii  250 mg Oral BID  . sodium chloride  3 mL Intravenous Q12H  . Warfarin - Pharmacist Dosing Inpatient   Does not apply q1800    Echo 07/23/12:  Left ventricle: The cavity size was normal. Wall thickness was normal. Indeterminant diastolic function (atrial fibrillation). Systolic function was mildly reduced. The estimated ejection fraction was in the range of 45% to 50%. Diffuse hypokinesis. - Aortic valve: There was no stenosis. - Mitral valve: Mildly calcified annulus. Mild regurgitation. - Left atrium: The atrium was severely dilated. - Right ventricle: The cavity size was moderately dilated. Systolic function was mildly reduced. - Right atrium: The atrium was moderately dilated. - Tricuspid valve: Peak RV-RA gradient: 28mm Hg (S). - Pulmonary arteries: PA systolic pressure 34-38 mmHg. - Systemic veins: IVC measured 2.2 cm with normal respirophasic variation, suggesting RA pressure 6-10 mmHg. Impressions:  - The patient was in atrial fibrillation. Normal LV size with mild diffuse hypokinesis, EF 45-50%. Moderately dilated RV with mildly decreased systolic function. Mild pulmonary hypertension.   ASSESSMENT AND PLAN:  1. Atrial fibrillation with RVR: Continue PO amiodarone and lopresser  Increase to tid 2. Acute on chronic diastolic CHF: Volume status better after IV Lasix. Continue home dose of po Lasix. Replacement of potassium.    3. Acute renal insufficiency: Stable.  [name]  2/23/20148:37 AM

## 2012-07-26 NOTE — Progress Notes (Signed)
[facility] PROGRESS NOTE  [name] AVW:[id number] DOB: [birth year] DOA: 07/20/2012 PCP: Kristian Covey, MD  Assessment/Plan  Atrial fibrillation with RVR, likely related to electrolyte derangement and dehydration.  It was noted on admission that Pt had failed previous attempts at cardioversion.  - cardizem drip started per cards 2/19-2/21, rate controlled initially improved him but on follow up the next day his HR rate was still poorly controlled and cardiology changed to IV amiodarone on 2/21 -per cards being changed to oral amiodarone today 2/22 -continuing coumadin as per cardiology recommendations, INR therapeutic. -Heart rate still poorly controlled this a.m., metoprolol dose increased today per cards Hypokalemia -resolved, 2/2 to GI losses and diarrhea  -follow and recheck  Hypomagnesemia -  Resolved, mg 2.1 on 2/18  Hypocalcemia, severe, improved - was Repleted with PO and IV calcium carbonate/gluconate  -his corrected calcium today is 8.4 (for albumin of 1.7) 2/19, and remains wnl today  Acute kidney injury, likely a combination of prerenal azotemia and ATN from chronic dehydration. - FENa < 1%   - Urinalysis with small protein - Renal US neg for acute findings -IVF decreased and lasix restarted 2/19 2/2 vol overload -cr trending down, follow  Chronic diarrhea, unclear etiology, none so far.   - d/[name] enteric precautions - [name] diff neg, Stool culture, O&P, Fecal lactoferrin, Occult stool pending - appreciate GI input, gliadin antibodies negative, tissue transglutaminase also negative - on scheduled antidiarrheal per gi diarrhea decreasing, follow  Leukocytosis, mild(11.1) and likely related to dehydration, electrolyte disturbance.  Resolved - follow CXRs likely more [name]/w CHF,  UA neg  - [name]. DIFF neg Normocytic anemia, hgb at baseline - Tx for hgb < 7   Lung infiltratesbibasilar -persistent per f/u CXR today, more likely acute diastolic CHF given elevated BNP  and peripheral edema. Less likely PNA as he is afebrile, no cough and no leukoytosis at this time -agree with echo today - continue outpt lasix -continue monitoring renal function closely on lasix  CAD/HTN/HLD:  - BP stable on cardizem drip - Pt not on aspirin due to falls risk in the setting of warfarin   COPD (chronic obstructive pulmonary disease),  - Continue ipratropium and xopenex-prn xopenex as well  - Continue budesonide   Peripheral neuropathy: Stable  - Hold gabapentin in setting of AKI   GERD (gastroesophageal reflux disease): Stable. Continue protonix  Falls frequently- may be due to muscle weakness from electrolyte disturbance - Continue correcting electrolytes  - PT/OT  Recommending SNF   Depression, uncontrolled and may be contributing to poor appetitie: follow and resume remeron as cr improves  Protein calorie malnutrition/weight loss. Likely 2/2 chronic diarrhea and poor appetite.    Proph: Therapeutic warfarin   Code Status: NO chest compressions or shocks if heart stops, NO intubation. Would want vasopressors and central line or cardioversion if necessary.  Family Communication: daughter at bedside  Disposition Plan:  Monitor in ICU today, follow and transfer to tele when HR better controlled   Consultants:  Cardiology  GI  Procedures:  None  Antibiotics:  None   HPI/Subjective: Diarrhea x1 this a.m., still tachycardic in the 120s to the telemetry Objective: Filed Vitals:   07/26/12 0400 07/26/12 0500 07/26/12 0800 07/26/12 0838  BP: 106/63  110/98   Pulse: 122  125   Temp: 98.4 F (36.9 [name])  97.9 F (36.6 [name])   TempSrc: Oral  Oral   Resp: 19  20   Height:      Weight: 71.6 kg (  157 lb 13.6 oz) 71.6 kg (157 lb 13.6 oz)    SpO2: 94%  95% 96%    Intake/Output Summary (Last 24 hours) at 07/26/12 0857 Last data filed at 07/26/12 0800  Gross per 24 hour  Intake  853.4 ml  Output   1291 ml  Net -437.6 ml   Filed Weights   07/25/12  0200 07/26/12 0400 07/26/12 0500  Weight: 70.6 kg (155 lb 10.3 oz) 71.6 kg (157 lb 13.6 oz) 71.6 kg (157 lb 13.6 oz)    Exam:   General:  Elderly male in no acute distress   HEENT:  NCAT, MMM  Cardiovascular:  Irreg, irreg,  tachy  . Respiratory:  Clear to auscultation anteriorly, decreased breath sounds at the bases.  Abdomen:  Hyperactive BS, soft, ND/NT  MSK:   No edema, no cyanosis Neuro:  Moves all extremities grossly, nonfocal Data Reviewed: Basic Metabolic Panel:  Recent Labs Lab 07/20/12 1255  07/21/12 0310  07/21/12 1429 07/21/12 2051 07/22/12 0313 07/22/12 0815 07/22/12 2145 07/23/12 0340 07/24/12 0352 07/25/12 0335  NA 137  < > 136  < > 136 137 136 136 140 140 137 135  K <2.0*  < > 2.8*  < > 3.6 3.2* 3.2* 3.5 3.3* 3.3* 3.3* 4.0  CL 94*  < > 97  < > 102 103 103 105 108 109 104 106  CO2 22  < > 22  < > 17* 19 19 19 21 21 22 20   GLUCOSE 131*  < > 122*  < > 136* 130* 104* 99 130* 114* 96 99  BUN 27*  < > 27*  < > 26* 24* 21 20 18 18 19 22   CREATININE 3.77*  < > 3.49*  < > 3.02* 2.73* 2.39* 2.17* 2.00* 1.91* 1.92* 1.68*  CALCIUM 5.1*  < > 6.0*  < > 6.0* 6.0* 5.9* 6.3* 6.6* 6.6* 6.8* 7.3*  MG 0.6*  --  2.1  --   --   --   --   --   --   --   --   --   PHOS 5.0*  < > 4.7*  < > 3.4 3.3 2.9 2.5 2.5  --   --   --   < > = values in this interval not displayed. Liver Function Tests:  Recent Labs Lab 07/20/12 1255  07/21/12 2051 07/22/12 0313 07/22/12 0815 07/22/12 2145 07/23/12 0340  AST 34  --   --   --   --   --  20  ALT 15  --   --   --   --   --  12  ALKPHOS 63  --   --   --   --   --  80  BILITOT 0.6  --   --   --   --   --  0.5  PROT 6.8  --   --   --   --   --  5.4*  ALBUMIN 2.2*  < > 1.6* 1.5* 1.6* 1.7* 1.6*  < > = values in this interval not displayed. No results found for this basename: LIPASE, AMYLASE,  in the last 168 hours No results found for this basename: AMMONIA,  in the last 168 hours CBC:  Recent Labs Lab 07/22/12 0313  07/23/12 0340 07/24/12 0352 07/25/12 0335 07/26/12 0357  WBC 10.4 9.5 8.1 7.8 7.3  HGB 10.3* 10.1* 10.0* 10.2* 10.2*  HCT 30.8* 30.1* 29.7* 31.0* 31.0*  MCV 81.7 82.0 82.5 82.9  82.0  PLT 199 204 208 210 225   Cardiac Enzymes:  Recent Labs Lab 07/20/12 1255 07/20/12 2155 07/21/12 0310 07/21/12 0920  TROPONINI <0.30 <0.30 <0.30 <0.30   BNP (last 3 results)  Recent Labs  07/20/12 2155 07/23/12 0340  PROBNP 7119.0* 13477.0*   CBG:  Recent Labs Lab 07/25/12 1139  GLUCAP 107*    Recent Results (from the past 240 hour(s))  MRSA PCR SCREENING     Status: Abnormal   Collection Time    07/20/12 10:01 PM      Result Value Range Status   MRSA by PCR POSITIVE (*) NEGATIVE Final   Comment:            The GeneXpert MRSA Assay (FDA     approved for NASAL specimens     only), is one component of a     comprehensive MRSA colonization     surveillance program. It is not     intended to diagnose MRSA     infection nor to guide or     monitor treatment for     MRSA infections.     RESULT CALLED TO, READ BACK BY AND VERIFIED WITH:     [name] MITCHELL AT 2336 ON 02.17.2014 BY NBROOKS  CLOSTRIDIUM DIFFICILE BY PCR     Status: None   Collection Time    07/21/12 10:57 PM      Result Value Range Status   [name] difficile by pcr NEGATIVE  NEGATIVE Final  STOOL CULTURE     Status: None   Collection Time    07/22/12  6:25 AM      Result Value Range Status   Specimen Description STOOL   Final   Special Requests Normal   Final   Culture     Final   Value: NO SALMONELLA, SHIGELLA, CAMPYLOBACTER, YERSINIA, OR E.COLI 0157:H7 ISOLATED   Report Status 07/25/2012 FINAL   Final  OVA AND PARASITE EXAMINATION     Status: None   Collection Time    07/22/12  6:25 AM      Result Value Range Status   Specimen Description STOOL   Final   Special Requests Normal   Final   Ova and parasites NO OVA OR PARASITES SEEN MODERATE YEAST   Final   Report Status 07/23/2012 FINAL   Final     Studies: No  results found.  Scheduled Meds: . amiodarone  200 mg Oral TID  . budesonide (PULMICORT) nebulizer solution  0.25 mg Nebulization BID  . dutasteride  0.5 mg Oral Daily  . ferrous sulfate  325 mg Oral BID WC  . furosemide  20 mg Oral Daily  . ipratropium  0.5 mg Nebulization TID  . levalbuterol  0.63 mg Nebulization TID  . loperamide  4 mg Oral BID AC  . metoprolol tartrate  12.5 mg Oral TID  . mupirocin ointment   Topical BID  . nystatin cream   Topical BID  . pantoprazole  40 mg Oral Daily  . potassium chloride  40 mEq Oral Once  . saccharomyces boulardii  250 mg Oral BID  . sodium chloride  3 mL Intravenous Q12H  . Warfarin - Pharmacist Dosing Inpatient   Does not apply q1800   Continuous Infusions: . sodium chloride 10 mL/hr (07/24/12 1513)    Principal Problem:   Atrial fibrillation with RVR Active Problems:   HYPERLIPIDEMIA   PERIPHERAL NEUROPATHY   HYPERTENSION   CORONARY ARTERY DISEASE   CEREBROVASCULAR ACCIDENT  DYSPHAGIA UNSPECIFIED   GERD (gastroesophageal reflux disease)   COPD (chronic obstructive pulmonary disease)   Hypokalemia   Hypomagnesemia   Hypocalcemia   Acute kidney injury   Chronic diarrhea   Falls frequently   Leukocytosis   Normocytic anemia   Acute diastolic CHF (congestive heart failure)    Time spent: 30 min    [name]  [facility] Pager [phone]. If 7PM-7AM, please contact night-coverage at [email or web address], password [facility] 07/26/2012, 8:57 AM  LOS: 6 days

## 2012-07-26 NOTE — Progress Notes (Addendum)
ANTICOAGULATION CONSULT NOTE - Follow up  Pharmacy Consult for Warfarin Indication: AFib  Allergies  Allergen Reactions  . Penicillins     hives   Patient Measurements: Height: 5\' 11"  (180.3 cm) Weight: 157 lb 13.6 oz (71.6 kg) IBW/kg (Calculated) : 75.3  Vital Signs: Temp: 97.9 F (36.6 C) (02/23 1200) Temp src: Oral (02/23 1200) BP: 110/98 mmHg (02/23 0800) Pulse Rate: 129 (02/23 1000)  Labs:  Recent Labs  07/24/12 0352 07/25/12 0335 07/26/12 0357  HGB 10.0* 10.2* 10.2*  HCT 29.7* 31.0* 31.0*  PLT 208 210 225  LABPROT 23.5* 23.0* 23.6*  INR 2.20* 2.14* 2.21*  CREATININE 1.92* 1.68*  --     Estimated Creatinine Clearance: 36.7 ml/min (by C-G formula based on Cr of 1.68).  Assessment:  79 yo M with PMH significant for CVA, Afib, upper GIB, and frequent falls, presented to ER 2/17 with FTT, diarrhea. Pt has had poor PO intake. Pt takes warfarin 2.5mg  daily. Last dose PTA reported as 2/10 due to INR = 8 on 2/10.   Cautiously resumed warfarin on 2/18.   INR remains in target range, 2.21 today   CBC ok. No bleeding reported/documented.  Drug Interaction: Amiodarone, Started 2/21, can increase the affects of INR   Goal of Therapy:  INR 2-3   Plan:   Repeat warfarin 1.5 mg today.  Daily INR and CBC.  BorgerdingLoma Messing PharmD Pager #: [phone] 12:40 PM 07/26/2012

## 2012-07-27 LAB — PROTIME-INR: INR: 2.29 — ABNORMAL HIGH (ref 0.00–1.49)

## 2012-07-27 LAB — BASIC METABOLIC PANEL
CO2: 21 mEq/L (ref 19–32)
Calcium: 7.3 mg/dL — ABNORMAL LOW (ref 8.4–10.5)
GFR calc non Af Amer: 42 mL/min — ABNORMAL LOW (ref >90)
Potassium: 3.4 mEq/L — ABNORMAL LOW (ref 3.5–5.1)
Sodium: 137 mEq/L (ref 135–145)

## 2012-07-27 LAB — CBC
HCT: 28.5 % — ABNORMAL LOW (ref 39.0–52.0)
Platelets: 194 10*3/uL (ref 150–400)
RBC: 3.46 MIL/uL — ABNORMAL LOW (ref 4.22–5.81)
RDW: 17.3 % — ABNORMAL HIGH (ref 11.5–15.5)
WBC: 5.8 10*3/uL (ref 4.0–10.5)

## 2012-07-27 MED ORDER — WARFARIN 0.5 MG HALF TABLET
1.5000 mg | ORAL_TABLET | Freq: Once | ORAL | Status: AC
  Administered 2012-07-27: 1.5 mg via ORAL
  Filled 2012-07-27: qty 1

## 2012-07-27 MED ORDER — METOPROLOL TARTRATE 25 MG PO TABS
25.0000 mg | ORAL_TABLET | Freq: Three times a day (TID) | ORAL | Status: DC
  Administered 2012-07-27 – 2012-07-28 (×4): 25 mg via ORAL
  Filled 2012-07-27 (×7): qty 1

## 2012-07-27 MED ORDER — POTASSIUM CHLORIDE CRYS ER 20 MEQ PO TBCR
40.0000 meq | EXTENDED_RELEASE_TABLET | Freq: Once | ORAL | Status: AC
  Administered 2012-07-27: 40 meq via ORAL
  Filled 2012-07-27: qty 2

## 2012-07-27 NOTE — Progress Notes (Signed)
Discussed pt with cards/Dr Elease Hashimoto, he is still tachy and BP stable. Will increase metoprolol to 25 TID with hold parameters and follow.  Donnalee Curry MD [facility] [phone]

## 2012-07-27 NOTE — Progress Notes (Signed)
Physical Therapy Treatment Patient Details Name: [name] MRN: [id number] DOB: 05-Sep-1932 Today's Date: 07/27/2012 Time: 4098-1191 PT Time Calculation (min): 14 min  PT Assessment / Plan / Recommendation Comments on Treatment Session  Pt with limited activity tolerance and reporting weakness and fatigue during ambulation.  HR up to 139 bpm during ambulation.    Follow Up Recommendations  SNF     Does the patient have the potential to tolerate intense rehabilitation     Barriers to Discharge        Equipment Recommendations  None recommended by PT    Recommendations for Other Services    Frequency     Plan Discharge plan remains appropriate;Frequency remains appropriate    Precautions / Restrictions Precautions Precautions: Fall Precaution Comments: monitor HR Restrictions Weight Bearing Restrictions: No   Pertinent Vitals/Pain HR pregait 114, during gait 139, postgait 128    Mobility  Bed Mobility Bed Mobility: Supine to Sit Supine to Sit: 4: Min assist;HOB elevated Details for Bed Mobility Assistance: assist for trunk, verbal cues for technique Transfers Transfers: Sit to Stand;Stand to Sit Sit to Stand: 4: Min assist;With upper extremity assist;From bed Stand to Sit: 4: Min assist;With upper extremity assist;To chair/3-in-1 Details for Transfer Assistance: verbal cues for technique, assist to rise and control descent Ambulation/Gait Ambulation/Gait Assistance: 4: Min assist Ambulation Distance (Feet): 35 Feet Assistive device: Rolling walker Ambulation/Gait Assistance Details: +2 for safety, assist to steady, legs with occasional buckling per pt due to weakness, verbal cues for safe use of RW Gait Pattern: Step-through pattern;Decreased stride length;Trunk flexed Gait velocity: decreased General Gait Details: HR increased to 139 bpm    Exercises     PT Diagnosis:    PT Problem List:   PT Treatment Interventions:     PT Goals Acute Rehab PT Goals PT  Goal: Supine/Side to Sit - Progress: Progressing toward goal PT Goal: Sit to Stand - Progress: Progressing toward goal PT Goal: Stand to Sit - Progress: Progressing toward goal PT Goal: Ambulate - Progress: Progressing toward goal  Visit Information  Last PT Received On: 07/27/12 Assistance Needed: +2    Subjective Data  Subjective: I haven't walked since I've been here.   Cognition  Cognition Overall Cognitive Status: Impaired Area of Impairment: Safety/judgement;Awareness of errors;Awareness of deficits Arousal/Alertness: Awake/alert Orientation Level: Appears intact for tasks assessed Behavior During Session: [facility] for tasks performed Safety/Judgement: Decreased awareness of safety precautions;Decreased safety judgement for tasks assessed;Impulsive;Decreased awareness of need for assistance Awareness of Errors: Assistance required to identify errors made;Assistance required to correct errors made    Balance     End of Session PT - End of Session Activity Tolerance: Patient limited by fatigue Patient left: in chair;with call bell/phone within reach Nurse Communication: Mobility status;Other (comment) (RN aware pt up in chair)   GP     [name],KATHrine E 07/27/2012, 12:51 PM Zenovia Jarred, PT, DPT 07/27/2012 Pager: [phone]

## 2012-07-27 NOTE — Progress Notes (Signed)
[facility] PROGRESS NOTE  RMANI KELLOGG GMW:[id number] DOB: August 04, 1932 DOA: 07/20/2012 PCP: Kristian Covey, MD  Assessment/Plan  Atrial fibrillation with RVR, likely related to electrolyte derangement and dehydration.  It was noted on admission that Pt had failed previous attempts at cardioversion.  - cardizem drip started per cards 2/19-2/21, rate controlled initially improved him but on follow up the next day his HR rate was still poorly controlled and cardiology changed to IV amiodarone on 2/21 -per cards being changed to oral amiodarone today 2/22 -continuing coumadin as per cardiology recommendations, INR therapeutic. -Heart rate still poorly controlled this a.m., metoprolol 12.5 tid per per cards and amio 200 daily  Hypokalemia - 2/2 to GI losses/diarrhea and lasix -replace k  Hypomagnesemia -  Resolved, mg 2.1 on 2/18  Hypocalcemia, severe, improved - was Repleted with PO and IV calcium carbonate/gluconate  -his corrected calcium today is 8.4 (for albumin of 1.7) 2/19, and remains wnl today  Acute kidney injury, likely a combination of prerenal azotemia and ATN from chronic dehydration. - FENa < 1%   - Urinalysis with small protein - Renal US neg for acute findings -IVF decreased and lasix restarted 2/19 2/2 vol overload -cr continuing to improve  Chronic diarrhea, unclear etiology, none so far.   - d/c enteric precautions - C diff neg, Stool culture, O&P, Fecal lactoferrin, Occult stool pending - appreciate GI input, gliadin antibodies negative, tissue transglutaminase also negative - on scheduled antidiarrheal per gi, diarrhea decreasing, follow  Leukocytosis, mild(11.1) and likely related to dehydration, electrolyte disturbance.  Resolved - follow CXRs likely more c/w CHF,  UA neg  - c. DIFF neg Normocytic anemia, hgb at baseline - Tx for hgb < 7   Lung infiltratesbibasilar -persistent per f/u CXR today, more likely acute diastolic CHF given elevated  BNP and peripheral edema. Less likely PNA as he is afebrile, no cough and no leukoytosis at this time -agree with echo today - continue outpt lasix -continue monitoring renal function closely on lasix  CAD/HTN/HLD:  - BP stable on cardizem drip - Pt not on aspirin due to falls risk in the setting of warfarin   COPD (chronic obstructive pulmonary disease),  - Continue ipratropium and xopenex-prn xopenex as well  - Continue budesonide   Peripheral neuropathy: Stable  - Hold gabapentin in setting of AKI   GERD (gastroesophageal reflux disease): Stable. Continue protonix  Falls frequently- may be due to muscle weakness from electrolyte disturbance - Continue correcting electrolytes  - PT/OT  Recommending SNF   Depression, uncontrolled and may be contributing to poor appetitie: follow and resume remeron as cr improves  Protein calorie malnutrition/weight loss. Likely 2/2 chronic diarrhea and poor appetite.    Proph: Therapeutic warfarin   Code Status: NO chest compressions or shocks if heart stops, NO intubation. Would want vasopressors and central line or cardioversion if necessary.  Family Communication: daughter at bedside  Disposition Plan:  Monitor in ICU today, follow and transfer to tele when HR better controlled   Consultants:  Cardiology  GI  Procedures:  None  Antibiotics:  None   HPI/Subjective: Denies any further diarrhea this am, still tach in the 120s last pm. Denies CP, no SOB Objective: Filed Vitals:   07/26/12 2000 07/27/12 0000 07/27/12 0400 07/27/12 0500  BP: 107/62 97/60 117/72   Pulse: 117 110 123   Temp: 98.8 F (37.1 C) 99.2 F (37.3 C) 99.9 F (37.7 C)   TempSrc: Oral Oral Axillary   Resp: 20 21 20  Height:      Weight:    70.7 kg (155 lb 13.8 oz)  SpO2: 97% 94% 95%     Intake/Output Summary (Last 24 hours) at 07/27/12 5784 Last data filed at 07/27/12 0600  Gross per 24 hour  Intake    460 ml  Output   1185 ml  Net   -725 ml    Filed Weights   07/26/12 0400 07/26/12 0500 07/27/12 0500  Weight: 71.6 kg (157 lb 13.6 oz) 71.6 kg (157 lb 13.6 oz) 70.7 kg (155 lb 13.8 oz)    Exam:   General:  Elderly male in no acute distress, alert and orientedx3   HEENT:  NCAT, MMM  Cardiovascular:  Irreg, irreg,  tachy  . Respiratory:  Clear to auscultation anteriorly, decreased breath sounds at the bases.  Abdomen:  Hyperactive BS, soft, ND/NT  MSK:   No edema, no cyanosis Neuro:  Moves all extremities grossly, nonfocal Data Reviewed: Basic Metabolic Panel:  Recent Labs Lab 07/20/12 1255  07/21/12 0310  07/21/12 1429 07/21/12 2051 07/22/12 0313 07/22/12 0815 07/22/12 2145 07/23/12 0340 07/24/12 0352 07/25/12 0335 07/27/12 0337  NA 137  < > 136  < > 136 137 136 136 140 140 137 135 137  K <2.0*  < > 2.8*  < > 3.6 3.2* 3.2* 3.5 3.3* 3.3* 3.3* 4.0 3.4*  CL 94*  < > 97  < > 102 103 103 105 108 109 104 106 107  CO2 22  < > 22  < > 17* 19 19 19 21 21 22 20 21   GLUCOSE 131*  < > 122*  < > 136* 130* 104* 99 130* 114* 96 99 97  BUN 27*  < > 27*  < > 26* 24* 21 20 18 18 19 22 22   CREATININE 3.77*  < > 3.49*  < > 3.02* 2.73* 2.39* 2.17* 2.00* 1.91* 1.92* 1.68* 1.52*  CALCIUM 5.1*  < > 6.0*  < > 6.0* 6.0* 5.9* 6.3* 6.6* 6.6* 6.8* 7.3* 7.3*  MG 0.6*  --  2.1  --   --   --   --   --   --   --   --   --   --   PHOS 5.0*  < > 4.7*  < > 3.4 3.3 2.9 2.5 2.5  --   --   --   --   < > = values in this interval not displayed. Liver Function Tests:  Recent Labs Lab 07/20/12 1255  07/21/12 2051 07/22/12 0313 07/22/12 0815 07/22/12 2145 07/23/12 0340  AST 34  --   --   --   --   --  20  ALT 15  --   --   --   --   --  12  ALKPHOS 63  --   --   --   --   --  80  BILITOT 0.6  --   --   --   --   --  0.5  PROT 6.8  --   --   --   --   --  5.4*  ALBUMIN 2.2*  < > 1.6* 1.5* 1.6* 1.7* 1.6*  < > = values in this interval not displayed. No results found for this basename: LIPASE, AMYLASE,  in the last 168 hours No results  found for this basename: AMMONIA,  in the last 168 hours CBC:  Recent Labs Lab 07/23/12 0340 07/24/12 0352 07/25/12 0335 07/26/12 0357 07/27/12 6962  WBC 9.5 8.1 7.8 7.3 5.8  HGB 10.1* 10.0* 10.2* 10.2* 9.5*  HCT 30.1* 29.7* 31.0* 31.0* 28.5*  MCV 82.0 82.5 82.9 82.0 82.4  PLT 204 208 210 225 194   Cardiac Enzymes:  Recent Labs Lab 07/20/12 1255 07/20/12 2155 07/21/12 0310 07/21/12 0920  TROPONINI <0.30 <0.30 <0.30 <0.30   BNP (last 3 results)  Recent Labs  07/20/12 2155 07/23/12 0340  PROBNP 7119.0* 13477.0*   CBG:  Recent Labs Lab 07/25/12 1139  GLUCAP 107*    Recent Results (from the past 240 hour(s))  MRSA PCR SCREENING     Status: Abnormal   Collection Time    07/20/12 10:01 PM      Result Value Range Status   MRSA by PCR POSITIVE (*) NEGATIVE Final   Comment:            The GeneXpert MRSA Assay (FDA     approved for NASAL specimens     only), is one component of a     comprehensive MRSA colonization     surveillance program. It is not     intended to diagnose MRSA     infection nor to guide or     monitor treatment for     MRSA infections.     RESULT CALLED TO, READ BACK BY AND VERIFIED WITH:     C MITCHELL AT 2336 ON 02.17.2014 BY NBROOKS  CLOSTRIDIUM DIFFICILE BY PCR     Status: None   Collection Time    07/21/12 10:57 PM      Result Value Range Status   C difficile by pcr NEGATIVE  NEGATIVE Final  STOOL CULTURE     Status: None   Collection Time    07/22/12  6:25 AM      Result Value Range Status   Specimen Description STOOL   Final   Special Requests Normal   Final   Culture     Final   Value: NO SALMONELLA, SHIGELLA, CAMPYLOBACTER, YERSINIA, OR E.COLI 0157:H7 ISOLATED   Report Status 07/25/2012 FINAL   Final  OVA AND PARASITE EXAMINATION     Status: None   Collection Time    07/22/12  6:25 AM      Result Value Range Status   Specimen Description STOOL   Final   Special Requests Normal   Final   Ova and parasites NO OVA OR  PARASITES SEEN MODERATE YEAST   Final   Report Status 07/23/2012 FINAL   Final     Studies: No results found.  Scheduled Meds: . amiodarone  200 mg Oral TID  . budesonide (PULMICORT) nebulizer solution  0.25 mg Nebulization BID  . dutasteride  0.5 mg Oral Daily  . ferrous sulfate  325 mg Oral BID WC  . furosemide  20 mg Oral Daily  . ipratropium  0.5 mg Nebulization TID  . levalbuterol  0.63 mg Nebulization TID  . loperamide  4 mg Oral BID AC  . metoprolol tartrate  12.5 mg Oral TID  . mupirocin ointment   Topical BID  . nystatin cream   Topical BID  . pantoprazole  40 mg Oral Daily  . potassium chloride  40 mEq Oral Once  . saccharomyces boulardii  250 mg Oral BID  . sodium chloride  3 mL Intravenous Q12H  . Warfarin - Pharmacist Dosing Inpatient   Does not apply q1800   Continuous Infusions: . sodium chloride 10 mL/hr (07/24/12 1513)    Principal Problem:   Atrial  fibrillation with RVR Active Problems:   HYPERLIPIDEMIA   PERIPHERAL NEUROPATHY   HYPERTENSION   CORONARY ARTERY DISEASE   CEREBROVASCULAR ACCIDENT   DYSPHAGIA UNSPECIFIED   GERD (gastroesophageal reflux disease)   COPD (chronic obstructive pulmonary disease)   Hypokalemia   Hypomagnesemia   Hypocalcemia   Acute kidney injury   Chronic diarrhea   Falls frequently   Leukocytosis   Normocytic anemia   Acute diastolic CHF (congestive heart failure)    Time spent: 30 min    Chasyn Cinque C  [facility] Pager [phone]. If 7PM-7AM, please contact night-coverage at [email or web address], password [facility] 07/27/2012, 8:38 AM  LOS: 7 days

## 2012-07-27 NOTE — Progress Notes (Signed)
ANTICOAGULATION CONSULT NOTE - Follow up  Pharmacy Consult for Warfarin Indication: AFib  Allergies  Allergen Reactions  . Penicillins     hives   Patient Measurements: Height: 5\' 11"  (180.3 cm) Weight: 155 lb 13.8 oz (70.7 kg) IBW/kg (Calculated) : 75.3  Vital Signs: Temp: 99.9 F (37.7 C) (02/24 0400) Temp src: Axillary (02/24 0400) BP: 117/72 mmHg (02/24 0400) Pulse Rate: 123 (02/24 0400)  Labs:  Recent Labs  07/25/12 0335 07/26/12 0357 07/27/12 0337  HGB 10.2* 10.2* 9.5*  HCT 31.0* 31.0* 28.5*  PLT 210 225 194  LABPROT 23.0* 23.6* 24.2*  INR 2.14* 2.21* 2.29*  CREATININE 1.68*  --  1.52*    Estimated Creatinine Clearance: 40.1 ml/min (by C-G formula based on Cr of 1.52).  Assessment:  79 yo M with PMH significant for CVA, Afib, upper GIB, and frequent falls, presented to ER 2/17 with FTT, diarrhea. Pt has had poor PO intake. Pt takes warfarin 2.5mg  daily. Last dose PTA reported as 2/10 due to INR = 8 on 2/10.   Cautiously resumed warfarin on 2/18.   INR remains in target range, 2.29 today   H/H stable, Pltc decreased. No bleeding reported/documented.  Drug Interaction: Amiodarone, Started 2/21, can increase the affects of INR   Goal of Therapy:  INR 2-3   Plan:   Repeat warfarin 1.5 mg today.  Daily INR and CBC.  Loralee Pacas, PharmD, BCPS Pager: [phone] 8:38 AM 07/27/2012

## 2012-07-27 NOTE — Clinical Social Work Note (Signed)
CSW continues to follow for SNF placement. CSW sent update to SNF. FL2 and DNR form in chart for signing. Pt has chosen bed at [facility] for when he is medically ready.   Doreen Salvage, LCSW ICU/Stepdown Clinical Social Worker [facility] Cell [phone] Hours 8am-1200pm M-F

## 2012-07-28 LAB — BASIC METABOLIC PANEL
GFR calc Af Amer: 49 mL/min — ABNORMAL LOW (ref >90)
GFR calc non Af Amer: 42 mL/min — ABNORMAL LOW (ref >90)
Potassium: 3.3 mEq/L — ABNORMAL LOW (ref 3.5–5.1)
Sodium: 135 mEq/L (ref 135–145)

## 2012-07-28 LAB — PROTIME-INR
INR: 2.65 — ABNORMAL HIGH (ref 0.00–1.49)
Prothrombin Time: 27 seconds — ABNORMAL HIGH (ref 11.6–15.2)

## 2012-07-28 LAB — CBC
Hemoglobin: 9.4 g/dL — ABNORMAL LOW (ref 13.0–17.0)
Platelets: 214 10*3/uL (ref 150–400)
RBC: 3.45 MIL/uL — ABNORMAL LOW (ref 4.22–5.81)
WBC: 6.2 10*3/uL (ref 4.0–10.5)

## 2012-07-28 MED ORDER — WARFARIN SODIUM 1 MG PO TABS
1.0000 mg | ORAL_TABLET | Freq: Once | ORAL | Status: AC
  Administered 2012-07-28: 1 mg via ORAL
  Filled 2012-07-28: qty 1

## 2012-07-28 MED ORDER — POTASSIUM CHLORIDE CRYS ER 20 MEQ PO TBCR
60.0000 meq | EXTENDED_RELEASE_TABLET | Freq: Once | ORAL | Status: AC
  Administered 2012-07-28: 60 meq via ORAL
  Filled 2012-07-28: qty 3

## 2012-07-28 NOTE — Progress Notes (Signed)
ANTICOAGULATION CONSULT NOTE - Follow up  Pharmacy Consult for Warfarin Indication: AFib  Allergies  Allergen Reactions  . Penicillins     hives   Patient Measurements: Height: 5\' 11"  (180.3 cm) Weight: 153 lb 10.6 oz (69.7 kg) IBW/kg (Calculated) : 75.3  Vital Signs: Temp: 99.1 F (37.3 C) (02/25 0739) Temp src: Oral (02/25 0400) BP: 126/80 mmHg (02/25 0739) Pulse Rate: 122 (02/25 0739)  Labs:  Recent Labs  07/26/12 0357 07/27/12 0337 07/28/12 0340  HGB 10.2* 9.5* 9.4*  HCT 31.0* 28.5* 28.4*  PLT 225 194 214  LABPROT 23.6* 24.2* 27.0*  INR 2.21* 2.29* 2.65*  CREATININE  --  1.52* 1.52*    Estimated Creatinine Clearance: 39.5 ml/min (by C-G formula based on Cr of 1.52).  Assessment:  79 yo M with PMH significant for CVA, Afib, upper GIB, and frequent falls, presented to ER 2/17 with FTT, diarrhea. Pt has had poor PO intake. Pt takes warfarin 2.5mg  daily. Last dose PTA reported as 2/10 due to INR = 8 on 2/10.   Cautiously resumed warfarin on 2/18.   INR remains therapeutic (2.65) but trending up   CBC stable. No bleeding reported/documented.  Drug Interaction: Amiodarone, Started 2/21, can increase the affects of INR over the next few weeks.   Goal of Therapy:  INR 2-3   Plan:   Decrease warfarin to 1 mg today.  Daily INR and CBC.  Loralee Pacas, PharmD, BCPS Pager: [phone] 8:28 AM 07/28/2012

## 2012-07-28 NOTE — Progress Notes (Signed)
Multiple skin tears from falls at home cleaned with NS and redressed after 5 days of Tegaderm dressings.  Sites have odor and brown drainage.  Redressed with Allevyn foam dsg.

## 2012-07-28 NOTE — Progress Notes (Signed)
[facility] PROGRESS NOTE  [name] WGN:[id number] DOB: 07-20-1932 DOA: 07/20/2012 PCP: Kristian Covey, MD  Brief history of present illness The patient is a 79 y.o. year-old male with history of HTN, CAD, CVA, systolic CHF with EF 45-50%, permanent atrial fibrillation on anticoagulation, COPD, chronic diarrhea, and progressive weakness who presented with generalized weakness, and frequent falls. It was reported that he had been eating only a few bites of food all day and has been sipping a few small cups of soda during the day. He has lost between 20-30 lbs in the last year per his wife. In the ER, he was noted to be in a-fib with RVR to the 150s with severe electrolyte derangement and AKI. Potassium < 2, BUN 27, creatinine 3.77 (baseline 0.9-1), calcium 5.1, Magnesium 0.6. He was initially started on diltiazem gtt, which caused hypotension and which was discontinued when electrolytes were reported. He received clacium 400mg  po once, ca gluc 3gm, KCl PO (awaiting second dose) and 30 mEQ IV, magnesium 2gm (awaiting a second 2gm) and a bolus. His heart rate trended down somewhat. She was admitted for further evaluation and management.    Assessment/Plan  Atrial fibrillation with RVR, likely related to electrolyte derangement and dehydration.  It was noted on admission that Pt had failed previous attempts at cardioversion.  - cardizem drip started per cards 2/19-2/21, rate controlled initially improved him but on follow up the next day his HR rate was still poorly controlled and cardiology changed to IV amiodarone on 2/21 -per cards being changed to oral amiodarone today 2/22 -continuing coumadin as per cardiology recommendations, INR therapeutic. -Heart rate still poorly controlled -the 120s overnight, and amiodarone and metoprolol which was increased to 25 3 times a day last PM(2/24) -Cards input today noted - not to be too focused on his heart rate as is not affecting him  clinically, we'll transfer to telemetry and follow.  Hypokalemia - 2/2 to GI losses/diarrhea and lasix -replace k  Hypomagnesemia -  Resolved, mg 2.1 on 2/18  Hypocalcemia, severe, improved - was Repleted with PO and IV calcium carbonate/gluconate  -his corrected calcium today is 8.4 (for albumin of 1.7) 2/19, and remains wnl today  Acute kidney injury, likely a combination of prerenal azotemia and ATN from chronic dehydration. - FENa < 1%   - Urinalysis with small protein - Renal US neg for acute findings -IVF decreased and lasix restarted 2/19 2/2 vol overload -cr gradually improved and stabilized overnight at 1.52 , follow  Chronic diarrhea, unclear etiology, none so far.   - d/c enteric precautions - C diff neg, Stool culture, O&P, Fecal lactoferrin, Occult stool pending - appreciate GI input, gliadin antibodies negative, tissue transglutaminase also negative - on scheduled antidiarrheal per gi, diarrhea decreasing, follow  Leukocytosis, mild(11.1) and likely related to dehydration, electrolyte disturbance.  - follow up CXRs likely more c/w CHF,  UA neg  - c. DIFF neg - Resolved Normocytic anemia, hgb at baseline - Tx for hgb < 7   Lung infiltratesbibasilar -persistent per f/u CXR today, more likely acute diastolic CHF given elevated BNP and peripheral edema. Less likely PNA as he is afebrile, no cough and no leukoytosis at this time -agree with echo today - continue outpt lasix -continue monitoring renal function closely on lasix  CAD/HTN/HLD:  - BP stable on cardizem drip - Pt not on aspirin due to falls risk in the setting of warfarin   COPD (chronic obstructive pulmonary disease),  - Continue  ipratropium and xopenex-prn xopenex as well  - Continue budesonide  -Clinically improved on the above treatments  Peripheral neuropathy: Stable  - Hold gabapentin in setting of AKI   GERD (gastroesophageal reflux disease): Stable. Continue protonix  Falls frequently-  may be due to muscle weakness from electrolyte disturbance - Continue correcting electrolytes  - PT/OT  Recommending SNF   Depression, uncontrolled and may be contributing to poor appetitie: follow and resume remeron as cr improves  Protein calorie malnutrition/weight loss. Likely 2/2 chronic diarrhea and poor appetite.    Proph: Therapeutic warfarin   Code Status: NO chest compressions or shocks if heart stops, NO intubation. Would want vasopressors and central line or cardioversion if necessary.  Family Communication: daughter at bedside  Disposition Plan:  Transfer to telemetry, follow and d/c when heart rate improved   Consultants:  Cardiology  GI  Procedures:  None  Antibiotics:  None   HPI/Subjective: Doing well today, denies chest pain, no shortness of breath.+bm today but states it was not diarrhea Objective: Filed Vitals:   07/28/12 0500 07/28/12 0533 07/28/12 0739 07/28/12 0852  BP:  116/62 126/80   Pulse:  108 122   Temp:   99.1 F (37.3 C)   TempSrc:      Resp:  17 19   Height:      Weight: 69.7 kg (153 lb 10.6 oz)     SpO2:  96% 98% 96%    Intake/Output Summary (Last 24 hours) at 07/28/12 0920 Last data filed at 07/28/12 0700  Gross per 24 hour  Intake    220 ml  Output   1065 ml  Net   -845 ml   Filed Weights   07/26/12 0500 07/27/12 0500 07/28/12 0500  Weight: 71.6 kg (157 lb 13.6 oz) 70.7 kg (155 lb 13.8 oz) 69.7 kg (153 lb 10.6 oz)    Exam:   General:  Elderly male in no acute distress, alert and orientedx3   HEENT:  NCAT, MMM  Cardiovascular:  Irreg, irreg,  tachy  . Respiratory:  Clear to auscultation anteriorly, decreased breath sounds at the bases.  Abdomen:  Hyperactive BS, soft, ND/NT  MSK:   No edema, no cyanosis Neuro:  Moves all extremities grossly, nonfocal Data Reviewed: Basic Metabolic Panel:  Recent Labs Lab 07/21/12 1429 07/21/12 2051 07/22/12 9811 07/22/12 0815 07/22/12 2145 07/23/12 0340 07/24/12 0352  07/25/12 0335 07/27/12 0337 07/28/12 0340  NA 136 137 136 136 140 140 137 135 137 135   K 3.6 3.2* 3.2* 3.5 3.3* 3.3* 3.3* 4.0 3.4* 3.3*  CL 102 103 103 105 108 109 104 106 107 104   CO2 17* 19 19 19 21 21 22 20 21 22   GLUCOSE 136* 130* 104* 99 130* 114* 96 99 97 93  BUN 26* 24* 21 20 18 18 19 22 22 22   CREATININE 3.02* 2.73* 2.39* 2.17* 2.00* 1.91* 1.92* 1.68* 1.52* 1.52*  CALCIUM 6.0* 6.0* 5.9* 6.3* 6.6* 6.6* 6.8* 7.3* 7.3* 7.2*  PHOS 3.4 3.3 2.9 2.5 2.5  --   --   --   --   --    Liver Function Tests:  Recent Labs Lab 07/21/12 2051 07/22/12 0313 07/22/12 0815 07/22/12 2145 07/23/12 0340  AST  --   --   --   --  20  ALT  --   --   --   --  12  ALKPHOS  --   --   --   --  80  BILITOT  --   --   --   --  0.5  PROT  --   --   --   --  5.4*  ALBUMIN 1.6* 1.5* 1.6* 1.7* 1.6*   No results found for this basename: LIPASE, AMYLASE,  in the last 168 hours No results found for this basename: AMMONIA,  in the last 168 hours CBC:  Recent Labs Lab 07/24/12 0352 07/25/12 0335 07/26/12 0357 07/27/12 0337 07/28/12 0340  WBC 8.1 7.8 7.3 5.8 6.2  HGB 10.0* 10.2* 10.2* 9.5* 9.4*  HCT 29.7* 31.0* 31.0* 28.5* 28.4*  MCV 82.5 82.9 82.0 82.4 82.3  PLT 208 210 225 194 214   Cardiac Enzymes: No results found for this basename: CKTOTAL, CKMB, CKMBINDEX, TROPONINI,  in the last 168 hours BNP (last 3 results)  Recent Labs  07/20/12 2155 07/23/12 0340  PROBNP 7119.0* 13477.0*   CBG:  Recent Labs Lab 07/25/12 1139  GLUCAP 107*    Recent Results (from the past 240 hour(s))  MRSA PCR SCREENING     Status: Abnormal   Collection Time    07/20/12 10:01 PM      Result Value Range Status   MRSA by PCR POSITIVE (*) NEGATIVE Final   Comment:            The GeneXpert MRSA Assay (FDA     approved for NASAL specimens     only), is one component of a     comprehensive MRSA colonization     surveillance program. It is not     intended to diagnose MRSA     infection nor to guide or      monitor treatment for     MRSA infections.     RESULT CALLED TO, READ BACK BY AND VERIFIED WITH:     C MITCHELL AT 2336 ON 02.17.2014 BY NBROOKS  CLOSTRIDIUM DIFFICILE BY PCR     Status: None   Collection Time    07/21/12 10:57 PM      Result Value Range Status   C difficile by pcr NEGATIVE  NEGATIVE Final  STOOL CULTURE     Status: None   Collection Time    07/22/12  6:25 AM      Result Value Range Status   Specimen Description STOOL   Final   Special Requests Normal   Final   Culture     Final   Value: NO SALMONELLA, SHIGELLA, CAMPYLOBACTER, YERSINIA, OR E.COLI 0157:H7 ISOLATED   Report Status 07/25/2012 FINAL   Final  OVA AND PARASITE EXAMINATION     Status: None   Collection Time    07/22/12  6:25 AM      Result Value Range Status   Specimen Description STOOL   Final   Special Requests Normal   Final   Ova and parasites NO OVA OR PARASITES SEEN MODERATE YEAST   Final   Report Status 07/23/2012 FINAL   Final     Studies: No results found.  Scheduled Meds: . amiodarone  200 mg Oral TID  . budesonide (PULMICORT) nebulizer solution  0.25 mg Nebulization BID  . dutasteride  0.5 mg Oral Daily  . ferrous sulfate  325 mg Oral BID WC  . furosemide  20 mg Oral Daily  . ipratropium  0.5 mg Nebulization TID  . levalbuterol  0.63 mg Nebulization TID  . loperamide  4 mg Oral BID AC  . metoprolol tartrate  25 mg Oral TID  . mupirocin ointment   Topical BID  . nystatin cream   Topical BID  . pantoprazole  40 mg Oral Daily  . potassium chloride  60 mEq Oral Once  . saccharomyces boulardii  250 mg Oral BID  . sodium chloride  3 mL Intravenous Q12H  . warfarin  1 mg Oral ONCE-1800  . Warfarin - Pharmacist Dosing Inpatient   Does not apply q1800   Continuous Infusions: . sodium chloride 1,000 mL (07/28/12 0747)    Principal Problem:   Atrial fibrillation with RVR Active Problems:   HYPERLIPIDEMIA   PERIPHERAL NEUROPATHY   HYPERTENSION   CORONARY ARTERY DISEASE    CEREBROVASCULAR ACCIDENT   DYSPHAGIA UNSPECIFIED   GERD (gastroesophageal reflux disease)   COPD (chronic obstructive pulmonary disease)   Hypokalemia   Hypomagnesemia   Hypocalcemia   Acute kidney injury   Chronic diarrhea   Falls frequently   Leukocytosis   Normocytic anemia   Acute diastolic CHF (congestive heart failure)    Time spent: 30 min    [name] C  [facility] Pager [phone]. If 7PM-7AM, please contact night-coverage at [email or web address], password [facility] 07/28/2012, 9:20 AM  LOS: 8 days

## 2012-07-28 NOTE — Progress Notes (Signed)
Occupational Therapy Treatment Patient Details Name: [name] MRN: [id number] DOB: 1933-01-28 Today's Date: 07/28/2012 Time: 4098-1191 OT Time Calculation (min): 20 min  OT Assessment / Plan / Recommendation Comments on Treatment Session Pt is making good gains but fatiques easily.      Follow Up Recommendations  SNF    Barriers to Discharge       Equipment Recommendations  Hospital bed    Recommendations for Other Services    Frequency Min 2X/week   Plan Discharge plan remains appropriate    Precautions / Restrictions Precautions Precautions: Fall Precaution Comments: monitor HR Restrictions Weight Bearing Restrictions: No   Pertinent Vitals/Pain No c/o pain.  HR up to high 120s.  Took sitting rest breaks    ADL  Grooming: Performed;Brushing hair;Set up Where Assessed - Grooming: Supported sitting Toilet Transfer: Simulated;Minimal assistance (second person for chair:  bed ambulate, chair) Toileting - Clothing Manipulation and Hygiene: Performed;+1 Total assistance Toileting - Clothing Manipulation and Hygiene: Patient Percentage: 0% Where Assessed - Toileting Clothing Manipulation and Hygiene: Standing Transfers/ADLs:  Pt was able to tolerate standing for 30 seconds.  Assisted pt with wiping when he got up from bed.      OT Diagnosis:    OT Problem List:   OT Treatment Interventions:     OT Goals Acute Rehab OT Goals Time For Goal Achievement: 08/04/12 ADL Goals Pt Will Perform Grooming: with set-up;Sitting, edge of bed;Sitting, chair;Unsupported ADL Goal: Grooming - Progress: Progressing toward goals Pt Will Transfer to Toilet: with min assist;Ambulation;with DME;Stand pivot transfer ADL Goal: Toilet Transfer - Progress: Progressing toward goals Pt Will Perform Toileting - Hygiene: with mod assist;Sit to stand from 3-in-1/toilet ADL Goal: Toileting - Hygiene - Progress: Progressing toward goals Additional ADL Goal #1: Pt will tolerate supported static  standing with minguard A x 1 minute in prep for standing ADL. ADL Goal: Additional Goal #1 - Progress: Updated due to goal met  Visit Information  Last OT Received On: 07/28/12 Assistance Needed: +2 (safety/chair follow)    Subjective Data      Prior Functioning       Cognition  Cognition Overall Cognitive Status: Impaired Area of Impairment: Safety/judgement;Awareness of errors;Awareness of deficits Arousal/Alertness: Awake/alert Orientation Level: Appears intact for tasks assessed Behavior During Session: [facility] for tasks performed Safety/Judgement: Impulsive;Decreased awareness of safety precautions;Decreased safety judgement for tasks assessed Safety/Judgement - Other Comments: Pt requires mod cues for safety when sitting in chair for rest breaks.  Awareness of Errors: Assistance required to identify errors made;Assistance required to correct errors made    Mobility  Bed Mobility Bed Mobility: Supine to Sit Supine to Sit: 4: Min assist;HOB elevated Details for Bed Mobility Assistance: Some assist for trunk to attain sitting position.  Cues for hand placement, safety and technique.  Transfers Sit to Stand: 4: Min assist;With upper extremity assist;From bed;From chair/3-in-1 Stand to Sit: 4: Min assist;With upper extremity assist;To chair/3-in-1 Details for Transfer Assistance: Mod cues for hand placement and safety as pt would sit before chair completely safe and secured behind him.     Exercises     Balance     End of Session OT - End of Session Activity Tolerance: Patient limited by fatigue Patient left: in chair;with call bell/phone within reach  GO     [facility] 07/28/2012, 12:44 PM Marica Otter, OTR/L [phone] 07/28/2012

## 2012-07-28 NOTE — Progress Notes (Signed)
Patient ID: [name], male   DOB: January 26, 1933, 79 y.o.   MRN: [id number]    SUBJECTIVE: No complaints. BM being cleaned by techs  BP 126/80  Pulse 122  Temp(Src) 99.1 F (37.3 C) (Oral)  Resp 19  Ht 5\' 11"  (1.803 m)  Wt 153 lb 10.6 oz (69.7 kg)  BMI 21.44 kg/m2  SpO2 98%  Intake/Output Summary (Last 24 hours) at 07/28/12 0831 Last data filed at 07/28/12 0700  Gross per 24 hour  Intake    230 ml  Output   1065 ml  Net   -835 ml    PHYSICAL EXAM General: Thin, elderly male. No acute distress. Alert and oriented x 3.  Psych: Good affect, responds appropriately  Neck: No JVD. No masses noted.  Lungs: Clear bilaterally with no wheezes or rhonci noted.  Heart: irreg irreg  Abdomen: Bowel sounds are present. Soft, non-tender.  Extremities: No lower extremity edema. echymosis  LABS: Basic Metabolic Panel:  Recent Labs  56/21/30 0337 07/28/12 0340  NA 137 135  K 3.4* 3.3*  CL 107 104  CO2 21 22  GLUCOSE 97 93  BUN 22 22  CREATININE 1.52* 1.52*  CALCIUM 7.3* 7.2*   CBC:  Recent Labs  07/27/12 0337 07/28/12 0340  WBC 5.8 6.2  HGB 9.5* 9.4*  HCT 28.5* 28.4*  MCV 82.4 82.3  PLT 194 214   Cardiac Enzymes: No results found for this basename: CKTOTAL, CKMB, CKMBINDEX, TROPONINI,  in the last 72 hours Current Meds: . amiodarone  200 mg Oral TID  . budesonide (PULMICORT) nebulizer solution  0.25 mg Nebulization BID  . dutasteride  0.5 mg Oral Daily  . ferrous sulfate  325 mg Oral BID WC  . furosemide  20 mg Oral Daily  . ipratropium  0.5 mg Nebulization TID  . levalbuterol  0.63 mg Nebulization TID  . loperamide  4 mg Oral BID AC  . metoprolol tartrate  25 mg Oral TID  . mupirocin ointment   Topical BID  . nystatin cream   Topical BID  . pantoprazole  40 mg Oral Daily  . saccharomyces boulardii  250 mg Oral BID  . sodium chloride  3 mL Intravenous Q12H  . warfarin  1 mg Oral ONCE-1800  . Warfarin - Pharmacist Dosing Inpatient   Does not apply q1800    Echo 07/23/12:  Left ventricle: The cavity size was normal. Wall thickness was normal. Indeterminant diastolic function (atrial fibrillation). Systolic function was mildly reduced. The estimated ejection fraction was in the range of 45% to 50%. Diffuse hypokinesis. - Aortic valve: There was no stenosis. - Mitral valve: Mildly calcified annulus. Mild regurgitation. - Left atrium: The atrium was severely dilated. - Right ventricle: The cavity size was moderately dilated. Systolic function was mildly reduced. - Right atrium: The atrium was moderately dilated. - Tricuspid valve: Peak RV-RA gradient: 28mm Hg (S). - Pulmonary arteries: PA systolic pressure 34-38 mmHg. - Systemic veins: IVC measured 2.2 cm with normal respirophasic variation, suggesting RA pressure 6-10 mmHg. Impressions:  - The patient was in atrial fibrillation. Normal LV size with mild diffuse hypokinesis, EF 45-50%. Moderately dilated RV with mildly decreased systolic function. Mild pulmonary hypertension.   ASSESSMENT AND PLAN:  1. Atrial fibrillation with RVR: Continue PO amiodarone 200 tid  Lopressor increased to 25 q8 last night.  Would not be too focussed on his rate As it is not really affecting him clinically Would worry about PT/OT more.  INR Rx at 2.65  2. Acute on chronic diastolic CHF: Volume status fine Decrease lasix to 20 mg daily home dose and may help with rate   3. Acute renal insufficiency: Stable.      [name]  2/25/20148:31 AM

## 2012-07-28 NOTE — Progress Notes (Signed)
Physical Therapy Treatment Patient Details Name: [name] MRN: [id number] DOB: January 24, 1933 Today's Date: 07/28/2012 Time: 5638-7564 PT Time Calculation (min): 28 min  PT Assessment / Plan / Recommendation Comments on Treatment Session  Pt making improvements with activity tolerance and increased distance today, however continues to fatigue and require rest breaks.      Follow Up Recommendations  SNF     Does the patient have the potential to tolerate intense rehabilitation     Barriers to Discharge        Equipment Recommendations  None recommended by PT    Recommendations for Other Services    Frequency Min 3X/week   Plan Discharge plan remains appropriate;Frequency remains appropriate    Precautions / Restrictions Precautions Precautions: Fall Precaution Comments: monitor HR   Pertinent Vitals/Pain No pain    Mobility  Bed Mobility Bed Mobility: Supine to Sit Supine to Sit: 4: Min assist;HOB elevated Details for Bed Mobility Assistance: Some assist for trunk to attain sitting position.  Cues for hand placement, safety and technique.  Transfers Transfers: Sit to Stand;Stand to Sit Sit to Stand: 4: Min assist;With upper extremity assist;From bed;From chair/3-in-1 Stand to Sit: 4: Min assist;With upper extremity assist;To chair/3-in-1 Details for Transfer Assistance: Mod cues for hand placement and safety as pt would sit before chair completely safe and secured behind him.  Ambulation/Gait Ambulation/Gait Assistance: 4: Min assist Ambulation Distance (Feet): 52 Feet (then another 15') Assistive device: Rolling walker Ambulation/Gait Assistance Details: cues for sequencing/technique with RW, maintaining upright posture and safety.   Gait Pattern: Step-through pattern;Decreased stride length;Trunk flexed Gait velocity: decreased General Gait Details: HR up to 136 during ambulation, 120's prior to ambulation and 113 following ambulation.     Exercises Total Joint  Exercises Ankle Circles/Pumps: AROM;Both;10 reps Quad Sets: Strengthening;Both;10 reps Heel Slides: Strengthening;AROM;Both;10 reps Straight Leg Raises: AAROM;Strengthening;Both;10 reps   PT Diagnosis:    PT Problem List:   PT Treatment Interventions:     PT Goals Acute Rehab PT Goals PT Goal Formulation: With patient Time For Goal Achievement: 08/04/12 Potential to Achieve Goals: Good Pt will go Supine/Side to Sit: Independently PT Goal: Supine/Side to Sit - Progress: Progressing toward goal Pt will go Sit to Stand: with supervision PT Goal: Sit to Stand - Progress: Progressing toward goal Pt will go Stand to Sit: with supervision PT Goal: Stand to Sit - Progress: Progressing toward goal Pt will Transfer Bed to Chair/Chair to Bed: with supervision PT Transfer Goal: Bed to Chair/Chair to Bed - Progress: Progressing toward goal Pt will Ambulate: 16 - 50 feet;with supervision;with rolling walker PT Goal: Ambulate - Progress: Progressing toward goal Pt will Perform Home Exercise Program: with supervision, verbal cues required/provided PT Goal: Perform Home Exercise Program - Progress: Progressing toward goal  Visit Information  Last PT Received On: 07/28/12 Assistance Needed: +2 (safety/chair follow)    Subjective Data  Subjective: My legs feel spongy. Patient Stated Goal: agreed to getting up   Cognition  Cognition Overall Cognitive Status: Impaired Area of Impairment: Safety/judgement;Awareness of errors;Awareness of deficits Arousal/Alertness: Awake/alert Orientation Level: Appears intact for tasks assessed Behavior During Session: [facility] for tasks performed Safety/Judgement: Impulsive;Decreased awareness of safety precautions;Decreased safety judgement for tasks assessed Safety/Judgement - Other Comments: Pt requires mod cues for safety when sitting in chair for rest breaks.  Awareness of Errors: Assistance required to identify errors made;Assistance required to correct  errors made    Balance     End of Session PT - End of Session  Equipment Utilized During Treatment: Gait belt Activity Tolerance: Patient limited by fatigue Patient left: in chair;with call bell/phone within reach Nurse Communication: Mobility status   GP     Vista Deck 07/28/2012, 12:32 PM

## 2012-07-29 LAB — BASIC METABOLIC PANEL
Chloride: 106 mEq/L (ref 96–112)
Creatinine, Ser: 1.43 mg/dL — ABNORMAL HIGH (ref 0.50–1.35)
GFR calc Af Amer: 53 mL/min — ABNORMAL LOW (ref >90)
Potassium: 3.5 mEq/L (ref 3.5–5.1)
Sodium: 137 mEq/L (ref 135–145)

## 2012-07-29 LAB — CBC
Hemoglobin: 9.7 g/dL — ABNORMAL LOW (ref 13.0–17.0)
MCHC: 33.1 g/dL (ref 30.0–36.0)
RBC: 3.56 MIL/uL — ABNORMAL LOW (ref 4.22–5.81)
WBC: 6.2 10*3/uL (ref 4.0–10.5)

## 2012-07-29 LAB — URINALYSIS, ROUTINE W REFLEX MICROSCOPIC
Ketones, ur: NEGATIVE mg/dL
Nitrite: NEGATIVE
pH: 5 (ref 5.0–8.0)

## 2012-07-29 LAB — PROTIME-INR
INR: 2.64 — ABNORMAL HIGH (ref 0.00–1.49)
Prothrombin Time: 26.9 seconds — ABNORMAL HIGH (ref 11.6–15.2)

## 2012-07-29 LAB — URINE MICROSCOPIC-ADD ON

## 2012-07-29 MED ORDER — BUDESONIDE-FORMOTEROL FUMARATE 160-4.5 MCG/ACT IN AERO: 2.0000 | INHALATION_SPRAY | Freq: Two times a day (BID) | RESPIRATORY_TRACT | Status: DC

## 2012-07-29 MED ORDER — WARFARIN SODIUM 1 MG PO TABS
1.0000 mg | ORAL_TABLET | Freq: Once | ORAL | Status: AC
  Administered 2012-07-29: 1 mg via ORAL
  Filled 2012-07-29: qty 1

## 2012-07-29 MED ORDER — METOPROLOL TARTRATE 50 MG PO TABS: ORAL_TABLET | ORAL | Status: DC

## 2012-07-29 MED ORDER — CIPROFLOXACIN HCL 500 MG PO TABS
500.0000 mg | ORAL_TABLET | Freq: Two times a day (BID) | ORAL | Status: DC
  Administered 2012-07-29: 500 mg via ORAL
  Filled 2012-07-29 (×2): qty 1

## 2012-07-29 MED ORDER — LOPERAMIDE HCL 2 MG PO CAPS: 4.0000 mg | ORAL_CAPSULE | Freq: Two times a day (BID) | ORAL | Status: DC

## 2012-07-29 MED ORDER — CIPROFLOXACIN HCL 500 MG PO TABS: 500.0000 mg | ORAL_TABLET | Freq: Two times a day (BID) | ORAL | Status: DC

## 2012-07-29 MED ORDER — METOPROLOL TARTRATE 50 MG PO TABS
50.0000 mg | ORAL_TABLET | Freq: Three times a day (TID) | ORAL | Status: DC
  Administered 2012-07-29 (×2): 50 mg via ORAL
  Filled 2012-07-29 (×3): qty 1

## 2012-07-29 MED ORDER — WARFARIN SODIUM 1 MG PO TABS: 1.0000 mg | ORAL_TABLET | Freq: Every day | ORAL | Status: DC

## 2012-07-29 MED ORDER — AMIODARONE HCL 200 MG PO TABS: 200.0000 mg | ORAL_TABLET | Freq: Three times a day (TID) | ORAL | Status: DC

## 2012-07-29 NOTE — Progress Notes (Signed)
EMS here to transport pt, report called to [place] [facility].

## 2012-07-29 NOTE — Progress Notes (Signed)
Spoke to [employer] pt's daughter, she states they (her and pt's wife) can't take patient back home, as he is weak and been falling, and incontinent of bowel and bladder. Renee states that [employer] has all of the necessary paper work. Writer will speak with patient regarding discharge to SNF. Dgt states her mother is on the way over to speak with patient (her husband) about SNF placement.

## 2012-07-29 NOTE — Progress Notes (Signed)
ANTIBIOTIC CONSULT NOTE - INITIAL  Pharmacy Consult for PO Cipro x7 Days Indication: UTI  Allergies  Allergen Reactions  . Penicillins     hives    Patient Measurements: Height: 5\' 11"  (180.3 cm) Weight: 150 lb 2.1 oz (68.1 kg) IBW/kg (Calculated) : 75.3  Vital Signs: Temp: 98 F (36.7 C) (02/26 1351) Temp src: Oral (02/26 1351) BP: 122/74 mmHg (02/26 1351) Pulse Rate: 92 (02/26 1351) Intake/Output from previous day: 02/25 0701 - 02/26 0700 In: 250 [P.O.:200; I.V.:50] Out: 1250 [Urine:1250] Intake/Output from this shift: Total I/O In: 360 [P.O.:360] Out: -   Labs:  Recent Labs  07/27/12 0337 07/28/12 0340 07/29/12 0448  WBC 5.8 6.2 6.2  HGB 9.5* 9.4* 9.7*  PLT 194 214 234  CREATININE 1.52* 1.52* 1.43*   Estimated Creatinine Clearance: 41 ml/min (by C-G formula based on Cr of 1.43). No results found for this basename: VANCOTROUGH, Leodis Binet, VANCORANDOM, GENTTROUGH, GENTPEAK, GENTRANDOM, TOBRATROUGH, TOBRAPEAK, TOBRARND, AMIKACINPEAK, AMIKACINTROU, AMIKACIN,  in the last 72 hours   Microbiology: Recent Results (from the past 720 hour(s))  MRSA PCR SCREENING     Status: Abnormal   Collection Time    07/20/12 10:01 PM      Result Value Range Status   MRSA by PCR POSITIVE (*) NEGATIVE Final   Comment:            The GeneXpert MRSA Assay (FDA     approved for NASAL specimens     only), is one component of a     comprehensive MRSA colonization     surveillance program. It is not     intended to diagnose MRSA     infection nor to guide or     monitor treatment for     MRSA infections.     RESULT CALLED TO, READ BACK BY AND VERIFIED WITH:     C MITCHELL AT 2336 ON 02.17.2014 BY NBROOKS  CLOSTRIDIUM DIFFICILE BY PCR     Status: None   Collection Time    07/21/12 10:57 PM      Result Value Range Status   C difficile by pcr NEGATIVE  NEGATIVE Final  STOOL CULTURE     Status: None   Collection Time    07/22/12  6:25 AM      Result Value Range Status   Specimen Description STOOL   Final   Special Requests Normal   Final   Culture     Final   Value: NO SALMONELLA, SHIGELLA, CAMPYLOBACTER, YERSINIA, OR E.COLI 0157:H7 ISOLATED   Report Status 07/25/2012 FINAL   Final  OVA AND PARASITE EXAMINATION     Status: None   Collection Time    07/22/12  6:25 AM      Result Value Range Status   Specimen Description STOOL   Final   Special Requests Normal   Final   Ova and parasites NO OVA OR PARASITES SEEN MODERATE YEAST   Final   Report Status 07/23/2012 FINAL   Final    Medical History: Past Medical History  Diagnosis Date  . History of upper gastrointestinal hemorrhage     dr. Dickie La, GI  . Cerebrovascular accident 30  . Gastric polyp   . Dysphagia, unspecified   . Coronary artery disease     a. Nonobstructive by cath 2009.  [name] Permanent atrial fibrillation     INR followed at [facility].  [name] Hypertension   . Esophagitis   . GERD (gastroesophageal reflux disease)   . Erosive gastritis  H/o GIB felt secondary to this  . Gastric ulcer   . Hiatal hernia   . Internal hemorrhoids   . Hearing impairment   . Vision problems   . Chronic bronchitis     a. h/o resp failure in setting of bronchitis vs PNA 05/2012.  [name] Hyperlipidemia   . ASD (atrial septal defect)     a. s/p repair 1983.  [name] BPH (benign prostatic hyperplasia)   . COPD (chronic obstructive pulmonary disease)   . Anemia   . Depression   . LV dysfunction     a. EF 45-50% by echo 05/2012.  [name] Chronic diarrhea     Medications:  Anti-infectives   Start     Dose/Rate Route Frequency Ordered Stop   07/29/12 1600  ciprofloxacin (CIPRO) tablet 500 mg     500 mg Oral 2 times daily 07/29/12 1352 08/05/12 1959     Assessment: 79 yo M to start 7 day course of PO cipro for UTI. CrCl ~ 68ml/min. Note: possible drug interaction with amiodarone (possible increased risk of Torsades) and warfarin (possible increased INR response). Possible interaction with Cipro and warfarin (may increase  the INR effect of warfarin).   Plan:  1) Cipro 500mg  PO BID x7 days 2) Watch for potential of QTc prolongation with amiodarone 3) Monitor INR closely while on Cipro.  Darrol Angel, PharmD Pager: [phone] 07/29/2012,1:52 PM

## 2012-07-29 NOTE — Telephone Encounter (Signed)
Pt still in the hospital  Will continue to check for when he is d/c'ed

## 2012-07-29 NOTE — Progress Notes (Signed)
ANTICOAGULATION CONSULT NOTE - Follow up  Pharmacy Consult for Warfarin Indication: AFib  Allergies  Allergen Reactions  . Penicillins     hives   Patient Measurements: Height: 5\' 11"  (180.3 cm) Weight: 150 lb 2.1 oz (68.1 kg) IBW/kg (Calculated) : 75.3  Vital Signs: Temp: 98.6 F (37 C) (02/26 0500) Temp src: Oral (02/26 0500) BP: 120/85 mmHg (02/26 0500) Pulse Rate: 94 (02/26 0500)  Labs:  Recent Labs  07/27/12 0337 07/28/12 0340 07/29/12 0448  HGB 9.5* 9.4* 9.7*  HCT 28.5* 28.4* 29.3*  PLT 194 214 234  LABPROT 24.2* 27.0* 26.9*  INR 2.29* 2.65* 2.64*  CREATININE 1.52* 1.52* 1.43*    Estimated Creatinine Clearance: 41 ml/min (by C-G formula based on Cr of 1.43).  Assessment:  79 yo M with PMH significant for CVA, Afib, upper GIB, and frequent falls, presented to ER 2/17 with FTT, diarrhea. Pt has had poor PO intake. Pt takes warfarin 2.5mg  daily. Last dose PTA reported as 2/10 due to INR = 8 on 2/10.   Cautiously resumed warfarin on 2/18.   INR remains therapeutic (2.64)   CBC stable. No bleeding reported/documented.  Drug Interaction: Amiodarone, Started 2/21, can increase the affects of INR over the next few weeks.  Cards has cleared for discharge   Goal of Therapy:  INR 2-3   Plan:   Repeat warfarin 1 mg today.  Daily INR and CBC.  If patient is discharged today, suggest sending out on warfarin 1mg  daily with next INR check on Fri 2/28 (given new drug interaction with amio).  Darrol Angel, PharmD Pager: [phone] 8:41 AM 07/29/2012

## 2012-07-29 NOTE — Progress Notes (Signed)
Patient is set to discharge to [facility] today. Patient & wife at bedside aware. Patient is now agreeable with plan for rehab @ [facility] prior to returning home. Discharge packet in [facility] with AVS, FL2, DNR and chart copy. [facility] called for transport.   Clinical Social Work Department CLINICAL SOCIAL WORK PLACEMENT NOTE 07/29/2012  Patient:  [name], [name]  Account Number:  [id number] Admit date:  07/20/2012  Clinical Social Worker:  Jodelle Red  Date/time:  07/20/2012 06:17 PM  Clinical Social Work is seeking post-discharge placement for this patient at the following level of care:   SKILLED NURSING   (*CSW will update this form in Epic as items are completed)   07/20/2012  Patient/family provided with [facility] Department of Clinical Social Work's list of facilities offering this level of care within the geographic area requested by the patient (or if unable, by the patient's family).  07/20/2012  Patient/family informed of their freedom to choose among providers that offer the needed level of care, that participate in Medicare, Medicaid or managed care program needed by the patient, have an available bed and are willing to accept the patient.  07/20/2012  Patient/family informed of [facility]' ownership interest in [facility], as well as of the fact that they are under no obligation to receive care at this facility.  PASARR submitted to EDS on 07/23/2012 PASARR number received from EDS on 07/24/2012  FL2 transmitted to all facilities in geographic area requested by pt/family on  07/21/2012 FL2 transmitted to all facilities within larger geographic area on   Patient informed that his/her managed care company has contracts with or will negotiate with  certain facilities, including the following:     Patient/family informed of bed offers received:  07/23/2012 Patient chooses bed at [facility] Physician recommends and patient  chooses bed at    Patient to be transferred to [facility] on  07/29/2012 Patient to be transferred to facility by [facility]  The following physician request were entered in Epic:   Additional Comments:  Unice Bailey, LCSW [facility] Clinical Social Worker cell #: [phone]

## 2012-07-29 NOTE — Progress Notes (Signed)
Patient ID: [name], male   DOB: 07-22-1932, 79 y.o.   MRN: [id number]    SUBJECTIVE: No complaints. BM being cleaned by techs  BP 120/85  Pulse 94  Temp(Src) 98.6 F (37 C) (Oral)  Resp 16  Ht 5\' 11"  (1.803 m)  Wt 150 lb 2.1 oz (68.1 kg)  BMI 20.95 kg/m2  SpO2 95%  Intake/Output Summary (Last 24 hours) at 07/29/12 4098 Last data filed at 07/29/12 0500  Gross per 24 hour  Intake    250 ml  Output   1250 ml  Net  -1000 ml    PHYSICAL EXAM General: Thin, elderly male. No acute distress. Alert and oriented x 3.  Psych: Good affect, responds appropriately  Neck: No JVD. No masses noted.  Lungs: Clear bilaterally with no wheezes or rhonci noted.  Heart: irreg irreg  Abdomen: Bowel sounds are present. Soft, non-tender.  Extremities: No lower extremity edema. echymosis  LABS: Basic Metabolic Panel:  Recent Labs  11/91/47 0340 07/29/12 0448  NA 135 137  K 3.3* 3.5  CL 104 106  CO2 22 22  GLUCOSE 93 90  BUN 22 20  CREATININE 1.52* 1.43*  CALCIUM 7.2* 7.5*   CBC:  Recent Labs  07/28/12 0340 07/29/12 0448  WBC 6.2 6.2  HGB 9.4* 9.7*  HCT 28.4* 29.3*  MCV 82.3 82.3  PLT 214 234   Cardiac Enzymes: No results found for this basename: CKTOTAL, CKMB, CKMBINDEX, TROPONINI,  in the last 72 hours Current Meds: . amiodarone  200 mg Oral TID  . budesonide (PULMICORT) nebulizer solution  0.25 mg Nebulization BID  . dutasteride  0.5 mg Oral Daily  . ferrous sulfate  325 mg Oral BID WC  . furosemide  20 mg Oral Daily  . ipratropium  0.5 mg Nebulization TID  . levalbuterol  0.63 mg Nebulization TID  . loperamide  4 mg Oral BID AC  . metoprolol tartrate  25 mg Oral TID  . mupirocin ointment   Topical BID  . nystatin cream   Topical BID  . pantoprazole  40 mg Oral Daily  . saccharomyces boulardii  250 mg Oral BID  . sodium chloride  3 mL Intravenous Q12H  . Warfarin - Pharmacist Dosing Inpatient   Does not apply q1800   Echo 07/23/12:  Left ventricle: The  cavity size was normal. Wall thickness was normal. Indeterminant diastolic function (atrial fibrillation). Systolic function was mildly reduced. The estimated ejection fraction was in the range of 45% to 50%. Diffuse hypokinesis. - Aortic valve: There was no stenosis. - Mitral valve: Mildly calcified annulus. Mild regurgitation. - Left atrium: The atrium was severely dilated. - Right ventricle: The cavity size was moderately dilated. Systolic function was mildly reduced. - Right atrium: The atrium was moderately dilated. - Tricuspid valve: Peak RV-RA gradient: 28mm Hg (S). - Pulmonary arteries: PA systolic pressure 34-38 mmHg. - Systemic veins: IVC measured 2.2 cm with normal respirophasic variation, suggesting RA pressure 6-10 mmHg. Impressions:  - The patient was in atrial fibrillation. Normal LV size with mild diffuse hypokinesis, EF 45-50%. Moderately dilated RV with mildly decreased systolic function. Mild pulmonary hypertension.   ASSESSMENT AND PLAN:  1. Atrial fibrillation with RVR: Continue PO amiodarone 200 tid  Lopressor will make 50/50/25 Asymptomatic rate  INR 2.64 RX 2. Acute on chronic diastolic CHF: Volume status fine Decrease lasix to 20 mg daily home dose and may help with rate   3. Acute renal insufficiency: Stable.  Ok to d/c telemetry and d/c to SNF Will sign off    [name]  2/26/20148:37 AM

## 2012-07-29 NOTE — Progress Notes (Signed)
Physical Therapy Treatment Patient Details Name: [name] MRN: [id number] DOB: Sep 12, 1932 Today's Date: 07/29/2012 Time: 4132-4401 PT Time Calculation (min): 26 min  PT Assessment / Plan / Recommendation Comments on Treatment Session  Pt able to tolerate increase in ambulation distance with HR 122 on monitor during ambulation.  Pt also performed exercises in recliner.    Follow Up Recommendations  SNF     Does the patient have the potential to tolerate intense rehabilitation     Barriers to Discharge        Equipment Recommendations  None recommended by PT    Recommendations for Other Services    Frequency     Plan Discharge plan remains appropriate;Frequency remains appropriate    Precautions / Restrictions Precautions Precautions: Fall Precaution Comments: monitor HR   Pertinent Vitals/Pain No pain    Mobility  Bed Mobility Bed Mobility: Not assessed Transfers Transfers: Stand to Sit;Sit to Stand Sit to Stand: 4: Min assist;With upper extremity assist;From chair/3-in-1 Stand to Sit: With upper extremity assist;To chair/3-in-1;4: Min guard Details for Transfer Assistance: verbal cues for safe technique, assist upon first rise to steady Ambulation/Gait Ambulation/Gait Assistance: 4: Min guard Ambulation Distance (Feet): 200 Feet Assistive device: Rolling walker Ambulation/Gait Assistance Details: verbal cues for posture and RW distance, also cues to rest with fatigue, HR 122 upon walking by monitor (no calls from RN regarding HR) Gait Pattern: Step-through pattern;Decreased stride length;Trunk flexed Gait velocity: decreased    Exercises General Exercises - Lower Extremity Ankle Circles/Pumps: AROM;Both;20 reps Quad Sets: AROM;Both;20 reps Gluteal Sets: AROM;Both;20 reps Long Arc Quad: AROM;Both;20 reps;Seated Heel Slides: Both;20 reps;AROM Hip ABduction/ADduction: AROM;Both;20 reps Hip Flexion/Marching: AROM;Seated;Both;20 reps   PT Diagnosis:    PT  Problem List:   PT Treatment Interventions:     PT Goals Acute Rehab PT Goals PT Goal: Sit to Stand - Progress: Progressing toward goal PT Goal: Stand to Sit - Progress: Progressing toward goal PT Goal: Ambulate - Progress: Progressing toward goal PT Goal: Perform Home Exercise Program - Progress: Progressing toward goal  Visit Information  Last PT Received On: 07/29/12 Assistance Needed: +1    Subjective Data  Subjective: I'm doing alright   Cognition  Cognition Overall Cognitive Status: Appears within functional limits for tasks assessed/performed Arousal/Alertness: Awake/alert Orientation Level: Appears intact for tasks assessed Behavior During Session: [facility] for tasks performed    Balance     End of Session PT - End of Session Activity Tolerance: Patient limited by fatigue Patient left: in chair;with call bell/phone within reach;with chair alarm set   GP     Kiril Hippe,KATHrine E 07/29/2012, 1:56 PM Zenovia Jarred, PT, DPT 07/29/2012 Pager: [phone]

## 2012-07-29 NOTE — Discharge Summary (Signed)
Physician Discharge Summary  [name]:[id number] DOB: 1933-05-28 DOA: 07/20/2012  PCP: Kristian Covey, MD  Admit date: 07/20/2012 Discharge date: 07/29/2012  Recommendations for Outpatient Follow-up:  1. To SNF for ongoing physical therapy, OT, and rehabilitation  2. CBC and INR check on 2/28.  Goal INR of 2-3.   3. Follow up cardiology within 1 month for amiodarone monitoring and dose titration 4. Follow up with primary care doctor within 1 week for repeat electrolytes, exam.  Restart some home medications if able and titrate lasix as needed.  Please follow up on final results of urine culture.   5. Last day of ciprofloxacin on 3/4.   6. Continue allevyn foam dressings to arm skin tears, change daily or sooner as needed.[name]    Discharge Diagnoses:  Principal Problem:   Atrial fibrillation with RVR Active Problems:   HYPERLIPIDEMIA   PERIPHERAL NEUROPATHY   HYPERTENSION   CORONARY ARTERY DISEASE   CEREBROVASCULAR ACCIDENT   DYSPHAGIA UNSPECIFIED   GERD (gastroesophageal reflux disease)   COPD (chronic obstructive pulmonary disease)   Hypokalemia   Hypomagnesemia   Hypocalcemia   Acute kidney injury   Chronic diarrhea   Falls frequently   Leukocytosis   Normocytic anemia   Acute diastolic CHF (congestive heart failure)   Discharge Condition: stable, improved  Diet recommendation: regular diet with thin liquids  Wt Readings from Last 3 Encounters:  07/29/12 68.1 kg (150 lb 2.1 oz)  06/29/12 71.305 kg (157 lb 3.2 oz)  06/12/12 75.297 kg (166 lb)    History of present illness:   The patient is a 79 y.o. year-old male with history of HTN, CAD, CVA, systolic CHF with EF 45-50%, permanent atrial fibrillation on anticoagulation, COPD, chronic diarrhea, and progressive weakness who presents with generalized weakness. The patient has not been at his baseline health since before his last admission to the hospital in December (2 months ago). At that time, he presented  with hypotension and fall. He had diarrhea and was tested for C. Diff, which was negative. He had electrolyte deficiencies which were repleted and he was discharged to home with lomotil and home PT/OT. Since going home, he has had persistent watery diarrhea about 2-3 times per day for the last 2-3 months. Although he was able to walk a few steps and assist with transfers prior to discharge, he has become progressively weaker to the point that he has been able to transfer with great difficulty and is nearing being bedbound. He has had 4 falls in the last month. He sits around all day and sleeps. He has been eating only a few bites of food all day and has been sipping a few small cups of soda during the day. He has lost between 20-30 lbs in the last year per his wife. He takes his medications some of the time, including his lasix. He has been otherwise well. No recent cold symptoms, nausea, vomiting, cough, congestion. Overnight, he had three episodes of diarrhea and had to be cleaned up. His wife has become fatigued and needs respite so she brought him to the ER with request for placement in SNF.  In the ER, he was noted to be in a-fib with RVR to the 150s with severe electrolyte derangement and AKI. Potassium < 2, BUN 27, creatinine 3.77 (baseline 0.9-1), calcium 5.1, Magnesium 0.6. He was initially started on diltiazem gtt, which caused hypotension and which was discontinued when electrolytes were reported. He received clacium 400mg  po once, ca gluc 3gm,  KCl PO (awaiting second dose) and 30 mEQ IV, magnesium 2gm (awaiting a second 2gm) and a bolus. His heart rate trended down somewhat.   Hospital Course:   Atrial fibrillation with RVR, likely related to electrolyte derangement and dehydration.  Pt has failed previous attempts at cardioversion.  His initial HR ranged from the 130s to the 150s.  He was hydrated and his electrolytes were replaced.  Cardiology was consulted and started a cardizem drip  on 2/19 that continued until 2/21.  His HR continued to be poorly controlled, and he was converted to amiodarone with HR in the low 100s.  His coumadin was dosed per pharmacy and his INR has been therapeutic for the last 3-4 days.  He should start warfarin 1mg  qhs and have a repeat INR in 2 days due to drug-drug interaction with amiodarone.  He should continue metoprolol as prescribed by Dr. Eden Emms:  50mg  with breakfast, 50mg  with lunch, and 25mg  before bed.  He should also continue amiodarone and follow up with cardiology for dose titration and monitoring while on amio.    Hypokalemia, severe with repeated potassium levels of < 2 initially, likely due to GI losses and lasix.  He was given several hundred mEQ of potassium both IV and oral form.    Hypomagnesemia, initially 0.6mg /dl.  He was given IV replacement and his magnesium level normalized.    Hypocalcemia, severe, improved with PO and IV calcium carbonate/gluconate and currently corrects to normal limits with albumin.  Likely due to lasix.    Acute kidney injury, likely a combination of prerenal azotemia and ATN from chronic dehydration.  FENa < 1%. Urinalysis with small protein. Renal US neg for acute findings.  His baseline creatinine is around 0.8, however, his creatinine peaked at 3.8 on 2/17 and trended down to 1.43 by the time of discharge.  He was hydrated, however, he developed pulmonary edema and his lasix was restarted at lower dose 2-3 days after admission and his electrolytes and creatinine have stabilized.    Chronic diarrhea, unclear etiology.  C diff, stool culture, fecal O&P, occult stool negative.  Gi was consulted.  Gliadin antibodies negative, tissue transglutaminase also negative.  Started on imodium and has been having approximately 1 stool per day on the current regimen.    Leukocytosis, mild(11.1) and likely related to dehydration, electrolyte disturbance and trended down with IVF.  CXR demonstrated pulmonary edema, UA was  negative, C diff was negative and WBC trended down to normal.    Normocytic anemia, hgb at baseline around 9.5.     Lung infiltrates bibasilar:  Likely acute diastolic and systolic CHF given elevated BNP and peripheral edema.  Less likely PNA as he is afebrile, no cough and no leukoytosis.  ECHO demonstrated EF of 45-50% with diffuse hypokinesis which likely is contributing.  He was restarted on lasix as above.    CAD/HTN/HLD:  BP and HR currently stable.   - Pt not on aspirin due to falls risk in the setting of warfarin   Systolic CHF is likely related to recent atrial fibrillation with RVR and electrolyte disturbance.   -  Not on ASA due to falls risk in setting of warfarin -  Hold ACEI/ARB due to recent and ongoing AKI -  On BB and lasix -  Daily weights and if gains more than 3-lbs in 1 day or 5-lbs in 1 week, call cardiologist   COPD (chronic obstructive pulmonary disease), stable.   - Continued ipratropium and xopenex-prn  xopenex as well  - Continued budesonide as inpatient and was given a prescription for symbicort at time of idscharge.   Peripheral neuropathy: Stable.  Held gabapentin in setting of AKI and may be restarted as an outpatient.   GERD (gastroesophageal reflux disease): Stable. Continued protonix   Falls frequently- may be due to muscle weakness from electrolyte disturbance and improved.  He was able to work with PT and OT who recommended SNF.    Depression, uncontrolled and may be contributing to poor appetite:  Resume remeron as cr improves as outpatient.  Protein calorie malnutrition/weight loss. Likely 2/2 chronic diarrhea and poor appetite.  Encourage dietary supplements.     Possible urinary tract infection, catheter associated. -  Foley discontinued -  Ciprofloxacin started 2/26 to complete a 7-day course -  PCP to follow up on final results of urine culture.    Consultants:  Cardiology  GI Procedures:  None Antibiotics:  None    Discharge  Exam: Filed Vitals:   07/29/12 1351  BP: 122/74  Pulse: 92  Temp: 98 F (36.7 C)  Resp: 18   Filed Vitals:   07/28/12 2154 07/29/12 0500 07/29/12 0922 07/29/12 1351  BP: 119/67 120/85  122/74  Pulse: 107 94  92  Temp: 97.1 F (36.2 C) 98.6 F (37 C)  98 F (36.7 C)  TempSrc: Oral Oral  Oral  Resp: 14 16  18   Height:      Weight:  68.1 kg (150 lb 2.1 oz)    SpO2: 97% 95% 95% 98%    General: CM, no acute distress, sitting in chair HEENT:  Mildly dry tongue, NCAT Cardiovascular: Mild tachycardia, IRRR, warm and well perfused Respiratory: CTAB, no increased WOB ABD:  NABS, soft, nondistended, nontender, no organomegaly MSK:  1+ LEE, decreased tone and bulk.     Discharge Instructions      Discharge Orders   Future Appointments Provider Department Dept Phone   08/03/2012 11:00 AM [facility] [phone]   08/03/2012 11:45 AM Nyoka Cowden, MD  [facility] [phone]   Future Orders Complete By Expires     (HEART FAILURE PATIENTS) Call MD:  Anytime you have any of the following symptoms: 1) 3 pound weight gain in 24 hours or 5 pounds in 1 week 2) shortness of breath, with or without a dry hacking cough 3) swelling in the hands, feet or stomach 4) if you have to sleep on extra pillows at night in order to breathe.  As directed     Call MD for:  difficulty breathing, headache or visual disturbances  As directed     Call MD for:  extreme fatigue  As directed     Call MD for:  hives  As directed     Call MD for:  persistant dizziness or light-headedness  As directed     Call MD for:  persistant nausea and vomiting  As directed     Call MD for:  severe uncontrolled pain  As directed     Call MD for:  temperature >100.4  As directed     Diet general  As directed     Discharge instructions  As directed     Comments:      You were hospitalized with diarrhea, dehydration, and severe electrolyte imbalance.  You also had atrial  fibrillation with a fast rate.  You were given IV fluids, electrolytes.  You were seen by the gastroenterologists and were tested for infectious  diarrhea and celiac disease, however, all tests were negative.  You were seen by the cardiologist who changed your bisoprolol to metoprolol and added amiodarone to help your heart rate.  Amiodarone requires monitoring and you will need to follow up with the cardiologists within 1 month.    Increase activity slowly  As directed         Medication List    STOP taking these medications       bisoprolol 5 MG tablet  Commonly known as:  ZEBETA     diphenoxylate-atropine 2.5-0.025 MG per tablet  Commonly known as:  LOMOTIL     gabapentin 600 MG tablet  Commonly known as:  NEURONTIN     megestrol 40 MG/ML suspension  Commonly known as:  MEGACE     mirtazapine 15 MG tablet  Commonly known as:  REMERON     potassium phosphate (monobasic) 500 MG tablet  Commonly known as:  K-PHOS      TAKE these medications       amiodarone 200 MG tablet  Commonly known as:  PACERONE  Take 1 tablet (200 mg total) by mouth 3 (three) times daily.     AVODART 0.5 MG capsule  Generic drug:  dutasteride  TAKE (1) CAPSULE DAILY     budesonide-formoterol 160-4.5 MCG/ACT inhaler  Commonly known as:  SYMBICORT  Inhale 2 puffs into the lungs 2 (two) times daily.     ciprofloxacin 500 MG tablet  Commonly known as:  CIPRO  Take 1 tablet (500 mg total) by mouth 2 (two) times daily.     ferrous sulfate 325 (65 FE) MG tablet  Take 1 tablet (325 mg total) by mouth 2 (two) times daily with a meal.     furosemide 20 MG tablet  Commonly known as:  LASIX  Take 20 mg by mouth daily.     ipratropium 0.02 % nebulizer solution  Commonly known as:  ATROVENT  Take 2.5 mLs (0.5 mg total) by nebulization 4 (four) times daily.     levalbuterol 0.63 MG/3ML nebulizer solution  Commonly known as:  XOPENEX  1 vial in neb twice daily with Ipratropium.  May take extra neb  every 4 hours if needed for wheezing/shortness of breath     loperamide 2 MG capsule  Commonly known as:  IMODIUM  Take 2 capsules (4 mg total) by mouth 2 (two) times daily before a meal.     metoprolol 50 MG tablet  Commonly known as:  LOPRESSOR  Take 1 tab at breakfast, 1 tab at lunch, and a half tab before bed.     pantoprazole 40 MG tablet  Commonly known as:  PROTONIX  Take 40 mg by mouth daily before breakfast.     saccharomyces boulardii 250 MG capsule  Commonly known as:  FLORASTOR  Take 250 mg by mouth 2 (two) times daily.     simvastatin 20 MG tablet  Commonly known as:  ZOCOR  Take 20 mg by mouth at bedtime.     warfarin 1 MG tablet  Commonly known as:  COUMADIN  Take 1 tablet (1 mg total) by mouth at bedtime.       Follow-up Information   Follow up with Kristian Covey, MD. Schedule an appointment as soon as possible for a visit in 1 week. (repeat BMP, consider restarting some medications)    Contact information:   [address] Christena Flake [facility] [place] [address] [phone]       Follow up with Hillis Range,  MD. Schedule an appointment as soon as possible for a visit in 1 month.   Contact information:   [address] [place] [address] [phone]        The results of significant diagnostics from this hospitalization (including imaging, microbiology, ancillary and laboratory) are listed below for reference.    Significant Diagnostic Studies: US Renal  07/21/2012  *RADIOLOGY REPORT*  Clinical Data: Acute renal injury.  Rule out obstruction.  RENAL/URINARY TRACT ULTRASOUND COMPLETE  Comparison:  None.  Findings:  Right Kidney:  12.5 cm.  Slight cortical thinning.  No focal abnormality.  Normal echotexture.  No hydronephrosis or visible stones.  Left Kidney:  12.5 cm.  Slight cortical thinning.  Normal echotexture.  No focal abnormality or hydronephrosis.  No visible stones.  Bladder:  Foley catheter in place with bladder decompressed.   IMPRESSION: Slight cortical thinning bilaterally.  No acute findings.   Original Report Authenticated By: Charlett Nose, M.D.    Dg Chest Port 1 View  07/23/2012  *RADIOLOGY REPORT*  Clinical Data: Follow-up evaluation of infiltrates.  PORTABLE CHEST - 1 VIEW  Comparison: Chest x-ray 07/22/2012.  Findings: Extensive bibasilar opacities are again noted, which may reflect areas of atelectasis and/or consolidation.  Blunting of both of the costophrenic sulci, suggestive of a small bilateral pleural effusions.  Pulmonary venous congestion, without frank pulmonary edema.  Marked enlargement of the central pulmonary arteries suggestive of pulmonary arterial hypertension. Cardiomegaly. The patient is rotated to the right on today's exam, resulting in distortion of the mediastinal contours and reduced diagnostic sensitivity and specificity for mediastinal pathology. Atherosclerosis in the thoracic aorta.  Status post median sternotomy.  IMPRESSION: 1.  Persistent bibasilar opacities which may reflect areas of atelectasis and/or consolidation, likely with small bilateral pleural effusions. 2.  Cardiomegaly and marked dilatation of the central pulmonary arteries (suggestive of pulmonary arterial hypertension) redemonstrated. 3.  Atherosclerosis.   Original Report Authenticated By: Trudie Reed, M.D.    Dg Chest Port 1 View  07/22/2012  *RADIOLOGY REPORT*  Clinical Data: Shortness of breath  PORTABLE CHEST - 1 VIEW  Comparison: Portable chest x-ray of 07/21/2012  Findings: There is opacity in both lung bases probably representing atelectasis although pneumonia cannot be excluded.  There is elevation of the left hemidiaphragm and there does appear to be a large hiatal hernia as demonstrated previously.  Prominent pulmonary artery segments appear stable as is cardiomegaly.  Median sternotomy sutures are noted.  IMPRESSION:  1.  Bibasilar opacities right greater than left most consistent with atelectasis.  Cannot exclude  pneumonia. 2.  Stable large hiatal hernia and moderate cardiomegaly.   Original Report Authenticated By: Dwyane Dee, M.D.    Dg Chest Port 1 View  07/21/2012  *RADIOLOGY REPORT*  Clinical Data: Wheezing and shortness of breath.  PORTABLE CHEST - 1 VIEW  Comparison: 07/20/2012.  Findings: The cardiac silhouette, mediastinal and hilar contours are stable.  Stable prominent right hilar shadow due to a enlarged right pulmonary artery.  Stable emphysematous changes.  No definite acute overlying pulmonary process.  No pleural effusion or pneumothorax.  IMPRESSION:  Stable emphysematous changes.  No definite acute overlying pulmonary process.   Original Report Authenticated By: Rudie Meyer, M.D.    Dg Chest Port 1 View  07/20/2012  *RADIOLOGY REPORT*  Clinical Data: Shortness of breath, hypertension  PORTABLE CHEST - 1 VIEW  Comparison:   06/12/2012 and earlier studies  Findings: Previous median sternotomy.  Enlarged central pulmonary arteries.  Stable mild  cardiomegaly.  Interval improvement in the patchy subsegmental atelectasis or infiltrate seen in the lung bases.  No definite effusion although the lateral costophrenic angles are excluded.  IMPRESSION:  1.  Stable cardiomegaly with resolution of bibasilar infiltrates or atelectasis seen previously.   Original Report Authenticated By: D. Andria Rhein, MD     Microbiology: Recent Results (from the past 240 hour(s))  MRSA PCR SCREENING     Status: Abnormal   Collection Time    07/20/12 10:01 PM      Result Value Range Status   MRSA by PCR POSITIVE (*) NEGATIVE Final   Comment:            The GeneXpert MRSA Assay (FDA     approved for NASAL specimens     only), is one component of a     comprehensive MRSA colonization     surveillance program. It is not     intended to diagnose MRSA     infection nor to guide or     monitor treatment for     MRSA infections.     RESULT CALLED TO, READ BACK BY AND VERIFIED WITH:     C MITCHELL AT 2336 ON  02.17.2014 BY NBROOKS  CLOSTRIDIUM DIFFICILE BY PCR     Status: None   Collection Time    07/21/12 10:57 PM      Result Value Range Status   C difficile by pcr NEGATIVE  NEGATIVE Final  STOOL CULTURE     Status: None   Collection Time    07/22/12  6:25 AM      Result Value Range Status   Specimen Description STOOL   Final   Special Requests Normal   Final   Culture     Final   Value: NO SALMONELLA, SHIGELLA, CAMPYLOBACTER, YERSINIA, OR E.COLI 0157:H7 ISOLATED   Report Status 07/25/2012 FINAL   Final  OVA AND PARASITE EXAMINATION     Status: None   Collection Time    07/22/12  6:25 AM      Result Value Range Status   Specimen Description STOOL   Final   Special Requests Normal   Final   Ova and parasites NO OVA OR PARASITES SEEN MODERATE YEAST   Final   Report Status 07/23/2012 FINAL   Final     Labs: Basic Metabolic Panel:  Recent Labs Lab 07/22/12 2145  07/24/12 0352 07/25/12 0335 07/27/12 0337 07/28/12 0340 07/29/12 0448  NA 140  < > 137 135 137 135 137  K 3.3*  < > 3.3* 4.0 3.4* 3.3* 3.5  CL 108  < > 104 106 107 104 106  CO2 21  < > 22 20 21 22 22   GLUCOSE 130*  < > 96 99 97 93 90  BUN 18  < > 19 22 22 22 20   CREATININE 2.00*  < > 1.92* 1.68* 1.52* 1.52* 1.43*  CALCIUM 6.6*  < > 6.8* 7.3* 7.3* 7.2* 7.5*  PHOS 2.5  --   --   --   --   --   --   < > = values in this interval not displayed. Liver Function Tests:  Recent Labs Lab 07/22/12 2145 07/23/12 0340  AST  --  20  ALT  --  12  ALKPHOS  --  80  BILITOT  --  0.5  PROT  --  5.4*  ALBUMIN 1.7* 1.6*   No results found for this basename: LIPASE, AMYLASE,  in the last  168 hours No results found for this basename: AMMONIA,  in the last 168 hours CBC:  Recent Labs Lab 07/25/12 0335 07/26/12 0357 07/27/12 0337 07/28/12 0340 07/29/12 0448  WBC 7.8 7.3 5.8 6.2 6.2  HGB 10.2* 10.2* 9.5* 9.4* 9.7*  HCT 31.0* 31.0* 28.5* 28.4* 29.3*  MCV 82.9 82.0 82.4 82.3 82.3  PLT 210 225 194 214 234   Cardiac  Enzymes: No results found for this basename: CKTOTAL, CKMB, CKMBINDEX, TROPONINI,  in the last 168 hours BNP: BNP (last 3 results)  Recent Labs  07/20/12 2155 07/23/12 0340  PROBNP 7119.0* 13477.0*   CBG:  Recent Labs Lab 07/25/12 1139  GLUCAP 107*    Time coordinating discharge: 45 minutes  Signed:  Kushi Kun  [facility] 07/29/2012, 2:49 PM

## 2012-07-31 LAB — URINE CULTURE: Colony Count: >=100000

## 2012-08-03 ENCOUNTER — Ambulatory Visit: Payer: Medicare Other | Admitting: Internal Medicine

## 2012-08-03 ENCOUNTER — Telehealth: Payer: Self-pay | Admitting: Cardiovascular Disease

## 2012-08-03 NOTE — Telephone Encounter (Signed)
This is Allreds patient consulted on by [facility].  I did not discharge him hospitalist did  As far as I know He was supposed to be discharged on amiodarone 200 tid and lopressor tid dose 50/50/ and 25 at night

## 2012-08-03 NOTE — Telephone Encounter (Signed)
LMTCB

## 2012-08-03 NOTE — Telephone Encounter (Signed)
[facility] AWARE THAT DR Eden Emms DID NOT DISCHARGE PT   IF THERE MAY BE QUESTIONS TO CONTACT DISCHARGING MD  STAFF STATED  THAT MEDICAL DIRECTOR AT FACILITY  ASKED  NURSE TO CALL CARDIOLOGIST TO REVIEW  DISCHARGE MEDS .WILL FORWARD TO DR Eden Emms FOR REVIEW .Zack Seal

## 2012-08-03 NOTE — Telephone Encounter (Signed)
New problem    Clarification regarding medication .

## 2012-08-05 ENCOUNTER — Telehealth: Payer: Self-pay | Admitting: Family

## 2012-08-05 NOTE — Telephone Encounter (Signed)
Left message for her to return my call.

## 2012-08-05 NOTE — Telephone Encounter (Signed)
Left detailed message on personalized voicemail to advise pt of the importance of scheduling a pt/inr appointment. Advised of last conversation with pt's wife when she was suppose to get back with me with a fax number for an office in [place] but I never heard from her about it.

## 2012-08-05 NOTE — Telephone Encounter (Signed)
Needs an INR 

## 2012-08-06 ENCOUNTER — Telehealth: Payer: Self-pay | Admitting: Family Medicine

## 2012-08-06 NOTE — Telephone Encounter (Signed)
Wife called to inform that pt is in [facility] in [place], [place].  Pt hospitalized prior to this at [facility]. When pt is discharged, wife will get back in touch. OK to leave message on machine.

## 2012-08-11 NOTE — Telephone Encounter (Deleted)
Faxed dischar

## 2012-08-11 NOTE — Telephone Encounter (Signed)
lmom for [name] to call me and we can fax discharge summary if needed

## 2012-08-19 ENCOUNTER — Telehealth: Payer: Self-pay | Admitting: Pharmacist

## 2012-08-19 ENCOUNTER — Ambulatory Visit: Payer: BC Managed Care – PPO | Admitting: Family Medicine

## 2012-08-19 NOTE — Telephone Encounter (Signed)
Pt is way over due for INR.  This is second time we have called and asked him to make appt ASAP.

## 2012-08-20 ENCOUNTER — Telehealth: Payer: Self-pay | Admitting: Family Medicine

## 2012-08-20 NOTE — Telephone Encounter (Signed)
Called [facility] with immunization details 

## 2012-08-20 NOTE — Telephone Encounter (Signed)
Has he had flu and pneumonia shots?

## 2012-08-21 ENCOUNTER — Encounter: Payer: Self-pay | Admitting: *Deleted

## 2012-08-21 NOTE — Telephone Encounter (Signed)
Letter mailed to the pt to call for appt 

## 2012-08-24 ENCOUNTER — Encounter: Payer: Medicare Other | Admitting: Physician Assistant

## 2012-08-28 ENCOUNTER — Telehealth: Payer: Self-pay | Admitting: *Deleted

## 2012-08-28 ENCOUNTER — Ambulatory Visit (INDEPENDENT_AMBULATORY_CARE_PROVIDER_SITE_OTHER): Payer: Medicare Other | Admitting: Family Medicine

## 2012-08-28 ENCOUNTER — Encounter: Payer: Self-pay | Admitting: Family Medicine

## 2012-08-28 VITALS — BP 130/70 | Temp 97.9°F | Wt 146.0 lb

## 2012-08-28 DIAGNOSIS — K529 Noninfective gastroenteritis and colitis, unspecified: Secondary | ICD-10-CM

## 2012-08-28 DIAGNOSIS — K219 Gastro-esophageal reflux disease without esophagitis: Secondary | ICD-10-CM

## 2012-08-28 DIAGNOSIS — R197 Diarrhea, unspecified: Secondary | ICD-10-CM

## 2012-08-28 DIAGNOSIS — E876 Hypokalemia: Secondary | ICD-10-CM

## 2012-08-28 DIAGNOSIS — I10 Essential (primary) hypertension: Secondary | ICD-10-CM

## 2012-08-28 DIAGNOSIS — I4891 Unspecified atrial fibrillation: Secondary | ICD-10-CM

## 2012-08-28 DIAGNOSIS — D649 Anemia, unspecified: Secondary | ICD-10-CM

## 2012-08-28 DIAGNOSIS — N179 Acute kidney failure, unspecified: Secondary | ICD-10-CM

## 2012-08-28 LAB — CBC WITH DIFFERENTIAL/PLATELET
Basophils Relative: 0.4 % (ref 0.0–3.0)
Eosinophils Relative: 0.7 % (ref 0.0–5.0)
HCT: 30 % — ABNORMAL LOW (ref 39.0–52.0)
Lymphs Abs: 1.1 10*3/uL (ref 0.7–4.0)
MCV: 83.6 fl (ref 78.0–100.0)
Monocytes Absolute: 0.7 10*3/uL (ref 0.1–1.0)
Platelets: 217 10*3/uL (ref 150.0–400.0)
WBC: 8.9 10*3/uL (ref 4.5–10.5)

## 2012-08-28 LAB — BASIC METABOLIC PANEL
BUN: 19 mg/dL (ref 6–23)
GFR: 49.6 mL/min — ABNORMAL LOW (ref >60.00)
Potassium: 4 mEq/L (ref 3.5–5.1)
Sodium: 136 mEq/L (ref 135–145)

## 2012-08-28 LAB — HEPATIC FUNCTION PANEL
AST: 21 U/L (ref 0–37)
Bilirubin, Direct: 0.1 mg/dL (ref 0.0–0.3)
Total Bilirubin: 0.4 mg/dL (ref 0.3–1.2)

## 2012-08-28 LAB — MAGNESIUM: Magnesium: 0.9 mg/dL — CL (ref 1.5–2.5)

## 2012-08-28 MED ORDER — MAGNESIUM OXIDE 400 MG PO TABS: 400.0000 mg | ORAL_TABLET | Freq: Every day | ORAL | Status: DC

## 2012-08-28 NOTE — Progress Notes (Signed)
Subjective:    Patient ID: [name], male    DOB: 23-Feb-1933, 79 y.o.   MRN: [id number]  HPI Hospital followup Patient has complicated past medical history with history including COPD, peripheral neuropathy, normocytic anemia, hypertension, hyperlipidemia, GERD, CAD, diastolic heart failure, atrial fibrillation, history of gastric ulcer, history of erosive gastritis and esophagitis, chronic diarrhea, history of CVA, BPH, B12 deficiency.  Recent admission 07/20/2012 to 07/29/2012. Patient presented with generalized weakness. He had several weeks of progressive diarrhea. He had increased falls, decreased ambulation, and dramatic decrease in activity and appetite. Patient reported 20-30 pound weight loss during the past year. In emergency room was noted to be in atrial fibrillation with rapid ventricular rate 150. Acute renal failure and multiple electrolyte abnormalities with potassium less than 2, creatinine 3.77, calcium 5.1, and magnesium 0.6. Electrolytes were corrected and patient started on diltiazem drip which caused hypotension. Patient eventually started on amiodarone. Continues on metoprolol.  Chronic diarrhea evaluated with fecal O&P, stool culture, C. difficile, celiac testing all normal. Diarrhea eventually improved on Imodium.  Patient had Klebsiella UTI sensitive to Cipro and full course of Cipro was completed.  Chronic normocytic anemia with baseline hemoglobin around 9.5.  Acute diastolic and systolic heart failure with elevated BNP on admission and peripheral edema. Echocardiogram ejection fraction 45-50% with diffuse hypokinesis. ACE inhibitor held secondary to acute kidney injury. Chronic dyspnea stable.  Depression felt to be contributing to his poor appetite and weight loss. Started on Remeron prior to admission and this was held during hospitalization with acute kidney injury.  Patient discharged to skilled nursing facility and started back on Remeron 7.5 mg  daily and Paxil 10 mg daily. At this point has loose stools but only about one per day. Continued poor appetite. Weight has continued to decline. Has home physical therapy set up. Home health nursing set up and INR obtained today.  Past Medical History  Diagnosis Date  . History of upper gastrointestinal hemorrhage     dr. Dickie La, GI  . Cerebrovascular accident 70  . Gastric polyp   . Dysphagia, unspecified   . Coronary artery disease     a. Nonobstructive by cath 2009.  [name] Permanent atrial fibrillation     INR followed at [facility].  [name] Hypertension   . Esophagitis   . GERD (gastroesophageal reflux disease)   . Erosive gastritis     H/o GIB felt secondary to this  . Gastric ulcer   . Hiatal hernia   . Internal hemorrhoids   . Hearing impairment   . Vision problems   . Chronic bronchitis     a. h/o resp failure in setting of bronchitis vs PNA 05/2012.  [name] Hyperlipidemia   . ASD (atrial septal defect)     a. s/p repair 1983.  [name] BPH (benign prostatic hyperplasia)   . COPD (chronic obstructive pulmonary disease)   . Anemia   . Depression   . LV dysfunction     a. EF 45-50% by echo 05/2012.  [name] Chronic diarrhea    Past Surgical History  Procedure Laterality Date  . Cholecystectomy  1990s  . Asd repair  [id number]  . Inguinal hernia repair  1963  . Partial small bowel resection  2005    ischemic?  [name] Enteroscopy  05/13/2012    Procedure: ENTEROSCOPY;  Surgeon: Louis Meckel, MD;  Location: [facility];  Service: Endoscopy;  Laterality: N/A;  . Cardioversion      possibly 3 times, dr. brody/cooper  reports that he quit smoking about 29 years ago. His smoking use included Cigarettes. He has a 20 pack-year smoking history. He has never used smokeless tobacco. He reports that he does not drink alcohol or use illicit drugs. family history includes Heart disease in his father and mother and Prostate cancer in his brother. Allergies  Allergen Reactions  . Penicillins     hives       Review of Systems  Constitutional: Positive for appetite change, fatigue and unexpected weight change. Negative for fever and chills.  HENT: Negative for trouble swallowing and voice change.   Eyes: Negative for visual disturbance.  Respiratory: Positive for cough and shortness of breath.   Cardiovascular: Positive for leg swelling. Negative for chest pain and palpitations.  Gastrointestinal: Positive for diarrhea. Negative for nausea, vomiting, abdominal pain and blood in stool.  Endocrine: Negative for polyuria.  Genitourinary: Negative for dysuria.  Skin: Negative for rash.  Neurological: Negative for dizziness.  Hematological: Negative for adenopathy.  Psychiatric/Behavioral: Positive for dysphoric mood.       Objective:   Physical Exam  Constitutional: He is oriented to person, place, and time.  Thin somewhat cachectic appearing gentleman in no distress.  HENT:  Mouth/Throat: Oropharynx is clear and moist.  Neck: Neck supple.  Cardiovascular:  Irregular rhythm rate controlled  Pulmonary/Chest:  Few faint wheezes. Few scattered rhonchi. No respiratory distress. No retractions. No rales  Abdominal: Soft. Bowel sounds are normal. He exhibits no distension. There is no tenderness. There is no rebound and no guarding.  Musculoskeletal: He exhibits edema.  1+ edema lower legs bilaterally  Lymphadenopathy:    He has no cervical adenopathy.  Neurological: He is alert and oriented to person, place, and time.          Assessment & Plan:  #1 Gen. deconditioning and weakness.  Multifactorial. Recent hospitalization and marked decrease in activity levels. Does have home PT in place.  Poor nutritional intake. Discussed supplements. #2 weight loss related to poor appetite and intake. Question etiology. Nutritional supplements discussed. Reassess 2 weeks. Consider CT abdomen pelvis that time if further weight loss  #3 atrial fibrillation rate controlled. Continue Coumadin.  INR reportedly checked earlier today-by home health. #4 history of hypokalemia. Recheck basic metabolic panel  #5 recent hypocalcemia. Check albumin and repeat calcium #6 hypomagnesemia. Recheck magnesium level #7 recent acute kidney injury. Recheck basic metabolic panel #8 normocytic anemia. Recheck CBC #9 chronic diarrhea with workup as above. Improving. We elected against using Questran at this point since he is improved. This was prescribed by skilled nursing facility #10 depression. Likely contributing to weight loss and decreased appetite. Continue current medications which were just started back and reassess 2 weeks  #11 history of COPD stable #12 recent Klebsiella UTI. Symptomatically improved. Finish full course of Cipro #13 chronic history of GERD stable  Over 50 minutes spent assessing patient this visit.

## 2012-08-28 NOTE — Telephone Encounter (Signed)
Spoke to pt's wife told her Rx for Magnesium was sent to pharmacy for pt and needs to start right away. Ms. Loren verbalized understanding.

## 2012-08-28 NOTE — Progress Notes (Deleted)
  Subjective:    Patient ID: [name], male    DOB: Mar 04, 1933, 79 y.o.   MRN: [id number]  HPI    Review of Systems     Objective:   Physical Exam        Assessment & Plan:

## 2012-08-28 NOTE — Telephone Encounter (Signed)
Received call from [facility] with [facility] reporting being at pt home as he has recently been released from nursing home after hospital stay for 1 week.  His PT is 27.5, INR 2.3.  Pt fell on Tuesday at home, EMS was called to evaluate.  Pt denied any immediate problem, no aches or pains at the time, so he was not transported.  Today he is C/O rib discomfort.  Pt has OV with Dr Sherene Sires on April 4.  Wife agreed to take pt in for post hospital visit this afternoon. At 3pm

## 2012-08-31 ENCOUNTER — Telehealth: Payer: Self-pay | Admitting: *Deleted

## 2012-08-31 NOTE — Telephone Encounter (Signed)
Received a message from Desiree Lucy [phone]) re: pt.  Questions re when to do PT, INR again.  Last done on Friday, 3/28.  PT 27.5, INR 2.3.  Per Dr Caryl Never, repeat in 1 week.  Also a question re: Florastor 250 mg.  Is he to continue BID?  Fax orders to [phone], or phone to [place] [phone] ext [phone]

## 2012-09-01 ENCOUNTER — Telehealth: Payer: Self-pay | Admitting: Family

## 2012-09-01 MED ORDER — WARFARIN SODIUM 1 MG PO TABS: 1.0000 mg | ORAL_TABLET | Freq: Every day | ORAL | Status: DC

## 2012-09-01 NOTE — Telephone Encounter (Signed)
Done

## 2012-09-01 NOTE — Telephone Encounter (Signed)
Pt is out and needs refill of warfarin (COUMADIN) 1 MG tablet Pharm: [facility]

## 2012-09-02 MED ORDER — SACCHAROMYCES BOULARDII 250 MG PO CAPS: 250.0000 mg | ORAL_CAPSULE | Freq: Two times a day (BID) | ORAL | Status: DC

## 2012-09-02 NOTE — Telephone Encounter (Signed)
Continue Florastor for now.

## 2012-09-02 NOTE — Telephone Encounter (Signed)
Tina informed on VM at [facility], Rx sent to pt pharmacy

## 2012-09-03 ENCOUNTER — Ambulatory Visit (INDEPENDENT_AMBULATORY_CARE_PROVIDER_SITE_OTHER): Payer: Medicare Other | Admitting: Internal Medicine

## 2012-09-03 ENCOUNTER — Encounter: Payer: Self-pay | Admitting: Internal Medicine

## 2012-09-03 VITALS — BP 122/60 | HR 66 | Temp 96.8°F | Ht 71.0 in | Wt 146.6 lb

## 2012-09-03 DIAGNOSIS — J449 Chronic obstructive pulmonary disease, unspecified: Secondary | ICD-10-CM

## 2012-09-03 DIAGNOSIS — I509 Heart failure, unspecified: Secondary | ICD-10-CM

## 2012-09-03 DIAGNOSIS — J9610 Chronic respiratory failure, unspecified whether with hypoxia or hypercapnia: Secondary | ICD-10-CM

## 2012-09-03 NOTE — Progress Notes (Signed)
Subjective:     Patient ID: [name], male   DOB: 15-Jul-1932, 79 y.o.   MRN: [id number]  HPI  77 yowm with chronic and slowly sp smoking cessation in the 1980s  With progressive doe x years. Reports on good day over the last year he can ambulate about 100 ft before onset of significant dyspnea and usually limits his activity because of this.   [name] He presented to the ER on 05/06/2012 in acute distress. He was placed on BIPAP, PCCM asked to admit   Admit date: 05/06/2012  Discharge date: 05/15/2012  Discharge Diagnoses:  Purulent bronchitis/ aecopd ? How much acei effect > acei d/c'd on admit Atrial fibrillation with RVR  Acute respiratory failure  Dysphagia  HTN (hypertension)  H/O: CVA (cerebrovascular accident)  GERD (gastroesophageal reflux disease)  Acute posthemorrhagic anemia  Gastritis with hemorrhage  Stricture and stenosis of esophagus   05/22/2012 f/u ov/Charmaine Placido cc about 50 % better since  Admit but leveling off in terms of activity tol and  t now on 2lpm, congested cough but no purulent sputum.  Has flutter not using, confused with meds/ changes. [name] helps some on prednisone taper finished on day of ov.    No purulent sputum. New leg swelling x sev days >>Cont Neb Four times a day  , mucinex /flutter , PPI Twice daily , Increase the diuril to 25 mg one daily  06/29/2012 one-month followup and medication review. Patient returns for a one-month followup and medication review. Patient brought all his medications, we reviewed them and organized them into a medication calendar with patient education. It appears the patient is taking his medications correctly Last visit was advised to increase diuretic.  CXR last ov showed bibasilar aspdz R>L .  Weight is down 9 lbs . No signiicant change in LE edema.  No significant change in dyspnea . Has DOE w/ 50- ft . Wears out easily.  No hemotpysis , chest pain , fever or orthopnea.  CAT 9 .  rec No change rx   Admit date: 07/20/2012   Discharge date: 07/29/2012  Recommendations for Outpatient Follow-up:  1. To SNF for ongoing physical therapy, OT, and rehabilitation  2. CBC and INR check on 2/28. Goal INR of 2-3.  3. Follow up cardiology within 1 month for amiodarone monitoring and dose titration 4. Follow up with primary care doctor within 1 week for repeat electrolytes, exam. Restart some home medications if able and titrate lasix as needed. Please follow up on final results of urine culture.  5. Last day of ciprofloxacin on 3/4.  6. Continue allevyn foam dressings to arm skin tears, change daily or sooner as needed.[name]  Discharge Diagnoses:  Principal Problem:  Atrial fibrillation with RVR  Active Problems:  HYPERLIPIDEMIA  PERIPHERAL NEUROPATHY  HYPERTENSION  CORONARY ARTERY DISEASE  CEREBROVASCULAR ACCIDENT  DYSPHAGIA UNSPECIFIED  GERD (gastroesophageal reflux disease)  COPD (chronic obstructive pulmonary disease)  Hypokalemia  Hypomagnesemia  Hypocalcemia  Acute kidney injury  Chronic diarrhea  Falls frequently  Leukocytosis  Normocytic anemia  Acute diastolic CHF (congestive heart failure)   09/03/2012 post hosp f/u ov/Maryclare Nydam cc  Chief Complaint  Patient presents with  . HFU    Pt states overall breathing is doing better since hospital d/c. He still has some DOE with minimal exertion.   cough no longer a problem, lies down ok, no 02  On symbicort 160 2bid plus prn neb still using at least a coubple of times a day  No obvious daytime variabilty or assoc purulent secretrions,   cp or chest tightness, subjective wheeze overt sinus or hb symptoms. No unusual exp hx or h/o childhood pna/ asthma or premature birth to his knowledge.   Sleeping ok without nocturnal  or early am exacerbation  of respiratory  c/o's or need for noct saba. Also denies any obvious fluctuation of symptoms with weather or environmental changes or other aggravating or alleviating factors except as outlined above   ROS  The following  are not active complaints unless bolded sore throat, dysphagia, dental problems, itching, sneezing,  nasal congestion or excess/ purulent secretions, ear ache,   fever, chills, sweats, unintended wt loss, pleuritic or exertional cp, hemoptysis,  orthopnea pnd or leg swelling, presyncope, palpitations, heartburn, abdominal pain, anorexia, nausea, vomiting, diarrhea  or change in bowel or urinary habits, change in stools or urine, dysuria,hematuria,  rash, arthralgias, visual complaints, headache, numbness weakness or ataxia or problems with walking using rolling walker or coordination,  change in mood/affect or memory.                      Objective:   Physical Exam    frail elderly wm wearing pj's   Wt Readings from Last 3 Encounters:  09/03/12 146 lb 9.6 oz (66.497 kg)  08/28/12 146 lb (66.225 kg)  07/29/12 150 lb 2.1 oz (68.1 kg)      HEENT mild turbinate edema.  Oropharynx no thrush or excess pnd or cobblestoning.  No JVD or cervical adenopathy. Mild accessory muscle hypertrophy. Trachea midline, nl thryroid. Chest was hyperinflated by percussion with diminished breath sounds and moderate increased exp time without wheeze.  Regular rate and rhythm without murmur gallop or rub or increase P2 . Pos 1 + plus edema.  Abd: no hsm, nl excursion. Ext warm without cyanosis or clubbing.    cxr 07/23/12 Persistent bibasilar opacities which may reflect areas of  atelectasis and/or consolidation, likely with small bilateral  pleural effusions.  2. Cardiomegaly and marked dilatation of the central pulmonary  arteries (suggestive of pulmonary arterial hypertension)  redemonstrated.  3. Atherosclerosis.    CXR  09/03/2012 :  rec but not done   Assessment:          Plan:

## 2012-09-03 NOTE — Patient Instructions (Addendum)
Plan A = automatic = Continue symbicort 160 Take 2 puffs first thing in am and then another 2 puffs about 12 hours later.   Work on inhaler technique:  relax and gently blow all the way out then take a nice smooth deep breath back in, triggering the inhaler at same time you start breathing in.  Hold for up to 5 seconds if you can.  Rinse and gargle with water when done   If your mouth or throat starts to bother you,   I suggest you time the inhaler to your dental care and after using the inhaler(s) brush teeth and tongue with a baking soda containing toothpaste and when you rinse this out, gargle with it first to see if this helps your mouth and throat.    Plan B = backup = Only use your albuterol (nebulizer)as a rescue medication to be used if you can't catch your breath by resting or doing a relaxed purse lip breathing pattern. The less you use it, the better it will work when you need it.   See Tammy NP w/in 2 weeks with all your medications, even over the counter meds, separated in two separate bags, the ones you take no matter what vs the ones you stop once you feel better and take only as needed when you feel you need them.   Tammy  will generate for you a new user friendly medication calendar that will put [name] all on the same page re: your medication use.     Without this process, it simply isn't possible to assure that we are providing  your outpatient care  with  the attention to detail we feel you deserve.   If we cannot assure that you're getting that kind of care,  then we cannot manage your problem effectively from this clinic.  Once you have seen Tammy and we are sure that we're all on the same page with your medication use she will arrange follow up with me.  If not able to use it Tammy will you give new  Plan A (advair vs neb perforomist/ics) Late add:  Need to clarify 02 rx next ov

## 2012-09-04 ENCOUNTER — Telehealth: Payer: Self-pay | Admitting: Family Medicine

## 2012-09-04 ENCOUNTER — Telehealth: Payer: Self-pay | Admitting: Family

## 2012-09-04 MED ORDER — AMIODARONE HCL 200 MG PO TABS: 200.0000 mg | ORAL_TABLET | Freq: Three times a day (TID) | ORAL | Status: DC

## 2012-09-04 MED ORDER — SACCHAROMYCES BOULARDII 250 MG PO CAPS: 250.0000 mg | ORAL_CAPSULE | Freq: Two times a day (BID) | ORAL | Status: DC

## 2012-09-04 MED ORDER — POTASSIUM CHLORIDE 20 MEQ PO PACK: 20.0000 meq | PACK | Freq: Two times a day (BID) | ORAL | Status: DC

## 2012-09-04 NOTE — Telephone Encounter (Signed)
Advised nurse to have pt take an extra tab today only then continue with current dose and rechk in 3 weeks. Nurse verbalized understanding

## 2012-09-04 NOTE — Telephone Encounter (Signed)
Pt needs refill of: saccharomyces boulardii (FLORASTOR) 250 MG capsule amiodarone (PACERONE) 200 MG tablet potassium chloride (KLOR-CON) 20 MEQ packet  Pharm: [facility]

## 2012-09-04 NOTE — Telephone Encounter (Signed)
Pt results as follows: PT  18.1 I&R 1.5 Taking Coum: 1mg /daily

## 2012-09-06 DIAGNOSIS — I509 Heart failure, unspecified: Secondary | ICD-10-CM | POA: Insufficient documentation

## 2012-09-06 NOTE — Assessment & Plan Note (Signed)
DDX of  difficult airways managment all start with A and  include Adherence, Ace Inhibitors, Acid Reflux, Active Sinus Disease, Alpha 1 Antitripsin deficiency, Anxiety masquerading as Airways dz,  ABPA,  allergy(esp in young), Aspiration (esp in elderly), Adverse effects of DPI,  Active smokers, plus two Bs  = Bronchiectasis and Beta blocker use..and one C= CHF   Adherence is always the initial "prime suspect" and is a multilayered concern that requires a "trust but verify" approach in every patient - starting with knowing how to use medications, especially inhalers, correctly, keeping up with refills and understanding the fundamental difference between maintenance and prns vs those medications only taken for a very short course and then stopped and not refilled.   Really struggling with concept of med reconciliation.  To keep things simple, I have asked the patient to first separate medicines that are perceived as maintenance, that is to be taken daily "no matter what", from those medicines that are taken on only on an as-needed basis and I have given the patient examples of both, and then return to see our NP to generate a  detailed  medication calendar which should be followed until the next physician sees the patient and updates it.   The proper method of use, as well as anticipated side effects, of a metered-dose inhaler are discussed and demonstrated to the patient. Improved effectiveness after extensive coaching during this visit to a level of approximately  75% so continue symbicort and see if can reduce the saba to prn  See instructions for specific recommendations which were reviewed directly with the patient who was given a copy with highlighter outlining the key components.

## 2012-09-06 NOTE — Assessment & Plan Note (Signed)
Adequate control on present rx, reviewed  

## 2012-09-08 ENCOUNTER — Other Ambulatory Visit: Payer: Medicare Other

## 2012-09-09 ENCOUNTER — Ambulatory Visit: Payer: Medicare Other | Admitting: Family Medicine

## 2012-09-09 ENCOUNTER — Other Ambulatory Visit: Payer: Medicare Other

## 2012-09-11 ENCOUNTER — Ambulatory Visit: Payer: Medicare Other | Admitting: Family Medicine

## 2012-09-11 ENCOUNTER — Encounter: Payer: Self-pay | Admitting: Family Medicine

## 2012-09-11 ENCOUNTER — Ambulatory Visit (INDEPENDENT_AMBULATORY_CARE_PROVIDER_SITE_OTHER): Payer: Medicare Other | Admitting: Family Medicine

## 2012-09-11 ENCOUNTER — Other Ambulatory Visit: Payer: Medicare Other

## 2012-09-11 ENCOUNTER — Ambulatory Visit (INDEPENDENT_AMBULATORY_CARE_PROVIDER_SITE_OTHER): Payer: Medicare Other | Admitting: Family

## 2012-09-11 DIAGNOSIS — R197 Diarrhea, unspecified: Secondary | ICD-10-CM

## 2012-09-11 DIAGNOSIS — K529 Noninfective gastroenteritis and colitis, unspecified: Secondary | ICD-10-CM

## 2012-09-11 DIAGNOSIS — I10 Essential (primary) hypertension: Secondary | ICD-10-CM

## 2012-09-11 DIAGNOSIS — I4891 Unspecified atrial fibrillation: Secondary | ICD-10-CM

## 2012-09-11 DIAGNOSIS — E538 Deficiency of other specified B group vitamins: Secondary | ICD-10-CM

## 2012-09-11 LAB — BASIC METABOLIC PANEL
CO2: 28 mEq/L (ref 19–32)
Calcium: 7.7 mg/dL — ABNORMAL LOW (ref 8.4–10.5)
Creatinine, Ser: 1.2 mg/dL (ref 0.4–1.5)
Glucose, Bld: 84 mg/dL (ref 70–99)

## 2012-09-11 MED ORDER — PAROXETINE HCL 10 MG PO TABS: 10.0000 mg | ORAL_TABLET | ORAL | Status: DC

## 2012-09-11 MED ORDER — CYANOCOBALAMIN 1000 MCG/ML IJ SOLN
1000.0000 ug | Freq: Once | INTRAMUSCULAR | Status: AC
  Administered 2012-09-11: 1000 ug via INTRAMUSCULAR

## 2012-09-11 MED ORDER — MIRTAZAPINE 7.5 MG PO TABS: 7.5000 mg | ORAL_TABLET | Freq: Every day | ORAL | Status: DC

## 2012-09-11 MED ORDER — METOPROLOL TARTRATE 50 MG PO TABS: ORAL_TABLET | ORAL | Status: DC

## 2012-09-11 NOTE — Patient Instructions (Signed)
INCREASE FUROSEMIDE 20 MG TO TWO TABLETS DAILY ELEVATE LEGS FREQUENTLY Increase protein rich foods.

## 2012-09-11 NOTE — Progress Notes (Signed)
Subjective:    Patient ID: [name], male    DOB: Oct 07, 1932, 79 y.o.   MRN: [id number]  HPI Followup regarding multiple Medical problems Refer to last note for details regarding recent hospitalization. Patient had generalized weakness, diarrhea, weight loss, atrial fibrillation with rapid ventricular response, acute renal failure, hypomagnesemia, malnutrition, and Klebsiella UTI.  Last visit we ordered some followup labs. Magnesium was extremely low and started back on replacement. Needs followup today. Denies any leg cramps. Weight is up 9 pounds from last visit but unfortunately has some increased peripheral edema. No increased dyspnea. Recent albumin 2.0. Continues to have poor appetite. Taking mirtazapine which has not made much difference in appetite.  Remains on Coumadin for atrial fibrillation. No recent INR. No bleeding complications. Previously has had INR monitored through [facility] clinic  Diarrhea has resolved.  Past Medical History  Diagnosis Date  . History of upper gastrointestinal hemorrhage     dr. Dickie La, GI  . Cerebrovascular accident 73  . Gastric polyp   . Dysphagia, unspecified   . Coronary artery disease     a. Nonobstructive by cath 2009.  [name] Permanent atrial fibrillation     INR followed at [facility].  [name] Hypertension   . Esophagitis   . GERD (gastroesophageal reflux disease)   . Erosive gastritis     H/o GIB felt secondary to this  . Gastric ulcer   . Hiatal hernia   . Internal hemorrhoids   . Hearing impairment   . Vision problems   . Chronic bronchitis     a. h/o resp failure in setting of bronchitis vs PNA 05/2012.  [name] Hyperlipidemia   . ASD (atrial septal defect)     a. s/p repair 1983.  [name] BPH (benign prostatic hyperplasia)   . COPD (chronic obstructive pulmonary disease)   . Anemia   . Depression   . LV dysfunction     a. EF 45-50% by echo 05/2012.  [name] Chronic diarrhea    Past Surgical History  Procedure Laterality Date  . Cholecystectomy   1990s  . Asd repair  [id number]  . Inguinal hernia repair  1963  . Partial small bowel resection  2005    ischemic?  [name] Enteroscopy  05/13/2012    Procedure: ENTEROSCOPY;  Surgeon: Louis Meckel, MD;  Location: [facility];  Service: Endoscopy;  Laterality: N/A;  . Cardioversion      possibly 3 times, dr. brody/cooper    reports that he quit smoking about 29 years ago. His smoking use included Cigarettes. He has a 20 pack-year smoking history. He has never used smokeless tobacco. He reports that he does not drink alcohol or use illicit drugs. family history includes Heart disease in his father and mother and Prostate cancer in his brother. Allergies  Allergen Reactions  . Penicillins     hives      Review of Systems  Constitutional: Positive for appetite change, fatigue and unexpected weight change.  HENT: Negative for trouble swallowing.   Respiratory: Negative for cough.   Cardiovascular: Positive for leg swelling. Negative for chest pain and palpitations.  Gastrointestinal: Negative for nausea, vomiting, abdominal pain and diarrhea.  Genitourinary: Negative for dysuria.  Neurological: Positive for weakness. Negative for dizziness and headaches.  Hematological: Negative for adenopathy.       Objective:   Physical Exam  Constitutional: He is oriented to person, place, and time. He appears well-developed and well-nourished.  Cardiovascular: Normal rate.   Pulmonary/Chest: Effort normal and breath sounds  normal. No respiratory distress. He has no wheezes. He has no rales.  Musculoskeletal: He exhibits edema.  Patient has 1+ to 2+ pitting edema legs bilaterally  Neurological: He is alert and oriented to person, place, and time.          Assessment & Plan:  #1 generalized weakness/deconditioning. He has finished home physical therapy. Continue home exercises. We discussed nutritional supplementation. Continue mirtazapine  #2 hypomagnesemia. Recheck magnesium level  #3  increased peripheral edema. Increase furosemide to 40 mg daily. Check basic metabolic panel. Elevate legs more frequently  #4 atrial fibrillation. Rate controlled. Check INR. #5 B12 deficiency. Injection given today

## 2012-09-11 NOTE — Patient Instructions (Addendum)
Take and extra 1/2 tab today and tomorrow.  Take 1 1/2 tan on Tuesday and Thursday. All other days, take 1 tab. Recheck April 30.   Anticoagulation Dose Instructions as of 09/11/2012     Glynis Smiles Tue Wed Thu Fri Sat   New Dose 1 mg 1 mg 1.5 mg 1 mg 1.5 mg 1 mg 1 mg    Description       Take and extra 1/2 tab today and tomorrow.  Take 1 1/2 tan on Tuesday and Thursday. All other days, take 1 tab. Recheck April 30.

## 2012-09-14 ENCOUNTER — Telehealth: Payer: Self-pay | Admitting: Internal Medicine

## 2012-09-14 DIAGNOSIS — J9610 Chronic respiratory failure, unspecified whether with hypoxia or hypercapnia: Secondary | ICD-10-CM

## 2012-09-14 NOTE — Telephone Encounter (Signed)
The note says to use it 24/7 so best to wait a week before we formally stop it especially since he's also on it for his heart per the records, though I'm glad he's feeling better

## 2012-09-14 NOTE — Telephone Encounter (Signed)
LMTCB x 1 

## 2012-09-14 NOTE — Telephone Encounter (Signed)
Spoke with pt's spouse She states that the pt is doing so well he no longer uses o2 She states that MW told him that he could stop p last ov, but I do not see this statement documented Pt does have upcoming appt with TP on 09/21/12 Do you want him to address this then, or go ahead and send d/c o2 order? Please advise, thanks!

## 2012-09-14 NOTE — Telephone Encounter (Signed)
Spoke with pt's spouse and notified of recs per MW She verbalized understanding and states nothing further needed

## 2012-09-15 ENCOUNTER — Telehealth: Payer: Self-pay | Admitting: Family Medicine

## 2012-09-15 ENCOUNTER — Other Ambulatory Visit: Payer: Self-pay | Admitting: *Deleted

## 2012-09-15 MED ORDER — PANTOPRAZOLE SODIUM 40 MG PO TBEC: DELAYED_RELEASE_TABLET | ORAL | Status: DC

## 2012-09-15 NOTE — Telephone Encounter (Signed)
[name], w/ [facility] request pt current med list.  Pt's wife is confused on what pt needs to be on and what to take. They have asked pharmacy for this follow up list. Pharm said OK to fax Fax:  [phone]

## 2012-09-16 NOTE — Telephone Encounter (Signed)
Med list faxed to number requested, confirmation received

## 2012-09-21 ENCOUNTER — Ambulatory Visit (INDEPENDENT_AMBULATORY_CARE_PROVIDER_SITE_OTHER): Payer: Medicare Other | Admitting: Adult Health

## 2012-09-21 ENCOUNTER — Encounter: Payer: Self-pay | Admitting: Adult Health

## 2012-09-21 VITALS — BP 122/66 | HR 73 | Temp 97.3°F | Ht 71.0 in | Wt 148.8 lb

## 2012-09-21 DIAGNOSIS — J9610 Chronic respiratory failure, unspecified whether with hypoxia or hypercapnia: Secondary | ICD-10-CM

## 2012-09-22 NOTE — Patient Instructions (Addendum)
Left prior to being seen. 

## 2012-09-24 NOTE — Progress Notes (Signed)
Left prior to being seen in office , tried to contact unsucessfully.

## 2012-09-24 NOTE — Assessment & Plan Note (Signed)
Left prior to being seen. 

## 2012-09-30 ENCOUNTER — Ambulatory Visit (INDEPENDENT_AMBULATORY_CARE_PROVIDER_SITE_OTHER): Payer: Medicare Other | Admitting: Family Medicine

## 2012-09-30 ENCOUNTER — Ambulatory Visit (INDEPENDENT_AMBULATORY_CARE_PROVIDER_SITE_OTHER): Payer: Medicare Other | Admitting: Family

## 2012-09-30 ENCOUNTER — Encounter: Payer: Self-pay | Admitting: Family Medicine

## 2012-09-30 VITALS — BP 158/68 | Temp 98.1°F | Wt 154.0 lb

## 2012-09-30 DIAGNOSIS — R600 Localized edema: Secondary | ICD-10-CM

## 2012-09-30 DIAGNOSIS — I4891 Unspecified atrial fibrillation: Secondary | ICD-10-CM

## 2012-09-30 DIAGNOSIS — K610 Anal abscess: Secondary | ICD-10-CM

## 2012-09-30 DIAGNOSIS — R609 Edema, unspecified: Secondary | ICD-10-CM

## 2012-09-30 DIAGNOSIS — I10 Essential (primary) hypertension: Secondary | ICD-10-CM

## 2012-09-30 DIAGNOSIS — R5383 Other fatigue: Secondary | ICD-10-CM

## 2012-09-30 DIAGNOSIS — K612 Anorectal abscess: Secondary | ICD-10-CM

## 2012-09-30 DIAGNOSIS — R5381 Other malaise: Secondary | ICD-10-CM

## 2012-09-30 LAB — BASIC METABOLIC PANEL
BUN: 19 mg/dL (ref 6–23)
CO2: 30 mEq/L (ref 19–32)
Calcium: 8.6 mg/dL (ref 8.4–10.5)
Creatinine, Ser: 1.4 mg/dL (ref 0.4–1.5)
Glucose, Bld: 92 mg/dL (ref 70–99)

## 2012-09-30 NOTE — Progress Notes (Signed)
Subjective:    Patient ID: [name], male    DOB: 04-14-1933, 79 y.o.   MRN: [id number]  HPI Followup multiple problems. Refer to prior note. He had generalized weakness and decreased appetite and general deconditioning. Multiple chronic problems including history of CAD, CHF, hypertension, atrial fibrillation, hyperlipidemia, COPD, chronic diarrhea, hypomagnesemia, and recent increased peripheral edema. He has poor nutritional status with recent albumin 2.    He remains on Remeron 7.5 mg at night. Continues to have poor appetite. Increased peripheral edema which we suspect largely related to low albumin and sedentary nature. Currently taking furosemide 20 mg twice daily No orthopnea.  New problem of some perianal pain for the past couple of weeks. Family concerned about hemorrhoid. Had some chronic diarrhea which is actually improving. No rectal bleeding. No fevers or chills.  Past Medical History  Diagnosis Date  . History of upper gastrointestinal hemorrhage     dr. Dickie La, GI  . Cerebrovascular accident 6  . Gastric polyp   . Dysphagia, unspecified   . Coronary artery disease     a. Nonobstructive by cath 2009.  [name] Permanent atrial fibrillation     INR followed at [facility].  [name] Hypertension   . Esophagitis   . GERD (gastroesophageal reflux disease)   . Erosive gastritis     H/o GIB felt secondary to this  . Gastric ulcer   . Hiatal hernia   . Internal hemorrhoids   . Hearing impairment   . Vision problems   . Chronic bronchitis     a. h/o resp failure in setting of bronchitis vs PNA 05/2012.  [name] Hyperlipidemia   . ASD (atrial septal defect)     a. s/p repair 1983.  [name] BPH (benign prostatic hyperplasia)   . COPD (chronic obstructive pulmonary disease)   . Anemia   . Depression   . LV dysfunction     a. EF 45-50% by echo 05/2012.  [name] Chronic diarrhea    Past Surgical History  Procedure Laterality Date  . Cholecystectomy  1990s  . Asd repair  [id number]  . Inguinal  hernia repair  1963  . Partial small bowel resection  2005    ischemic?  [name] Enteroscopy  05/13/2012    Procedure: ENTEROSCOPY;  Surgeon: Louis Meckel, MD;  Location: [facility];  Service: Endoscopy;  Laterality: N/A;  . Cardioversion      possibly 3 times, dr. brody/cooper    reports that he quit smoking about 29 years ago. His smoking use included Cigarettes. He has a 20 pack-year smoking history. He has never used smokeless tobacco. He reports that he does not drink alcohol or use illicit drugs. family history includes Heart disease in his father and mother and Prostate cancer in his brother. Allergies  Allergen Reactions  . Penicillins     hives      Review of Systems  Constitutional: Negative for fever and chills.  Respiratory: Positive for shortness of breath.   Cardiovascular: Positive for leg swelling. Negative for chest pain.  Genitourinary: Negative for dysuria.  Neurological: Negative for dizziness.       Objective:   Physical Exam  Constitutional: He appears well-developed and well-nourished.  Cardiovascular: Normal rate.   Pulmonary/Chest: Effort normal and breath sounds normal. No respiratory distress.  Genitourinary:  Perianal exam reveals minimal erythema dorsally near 6:00 position. Anal abscess which is actually draining spontaneously. No fluctuance. Minimally tender. No evidence for a significant external hemorrhoids. No thrombosis.  Musculoskeletal: He exhibits edema.  1+ to 2+ pitting edema lower legs feet and ankles bilaterally          Assessment & Plan:  #1 Gen. deconditioning and poor nutritional status. Increase mirtazapine to 15 mg once daily. #2 peripheral edema. Increased furosemide 20 mg to 2 in the morning and one early afternoon. Recheck basic metabolic panel #3 history of low magnesium. Recheck magnesium level #4 perianal pain. He has evidence for very small perianal abscess which is actually draining spontaneously. Try warm sitz baths  if possible though he's had difficulty due to deconditioning. If possible twice daily. Reassess one week and sooner as needed #5 Atrial fibrillation.  Getting INR repeat today.

## 2012-09-30 NOTE — Patient Instructions (Signed)
Increase Lasix to one tablet twice daily Increase potassium to two tablets daily Increase Mirtazepine to two tablets daily.

## 2012-09-30 NOTE — Patient Instructions (Addendum)
Today and tomorrow, take 2 tabs.  Then increase Coumadin to 2 tabs on Tuesdays, Thursdays, and Saturdays. All other days, 1 tab.  Anticoagulation Dose Instructions as of 09/30/2012     Glynis Smiles Tue Wed Thu Fri Sat   New Dose 1 mg 1 mg 2 mg 1 mg 2 mg 1 mg 2 mg    Description       Today and tomorrow, take 2 tabs.  Then increase Coumadin to 2 tabs on Tuesdays, Thursdays, and Saturdays. All other days, 1 tab.

## 2012-10-12 ENCOUNTER — Emergency Department (HOSPITAL_COMMUNITY): Payer: Medicare Other

## 2012-10-12 ENCOUNTER — Inpatient Hospital Stay (HOSPITAL_COMMUNITY)
Admission: EM | Admit: 2012-10-12 | Discharge: 2012-10-14 | DRG: 689 | Disposition: A | Discharge status: Home Health | Payer: Medicare Other | Attending: Internal Medicine | Admitting: Internal Medicine

## 2012-10-12 ENCOUNTER — Encounter (HOSPITAL_COMMUNITY): Payer: Self-pay | Admitting: Emergency Medicine

## 2012-10-12 DIAGNOSIS — IMO0002 Reserved for concepts with insufficient information to code with codable children: Secondary | ICD-10-CM

## 2012-10-12 DIAGNOSIS — D638 Anemia in other chronic diseases classified elsewhere: Secondary | ICD-10-CM

## 2012-10-12 DIAGNOSIS — Z7901 Long term (current) use of anticoagulants: Secondary | ICD-10-CM

## 2012-10-12 DIAGNOSIS — K2960 Other gastritis without bleeding: Secondary | ICD-10-CM

## 2012-10-12 DIAGNOSIS — D62 Acute posthemorrhagic anemia: Secondary | ICD-10-CM

## 2012-10-12 DIAGNOSIS — N183 Chronic kidney disease, stage 3 unspecified: Secondary | ICD-10-CM

## 2012-10-12 DIAGNOSIS — K449 Diaphragmatic hernia without obstruction or gangrene: Secondary | ICD-10-CM

## 2012-10-12 DIAGNOSIS — D126 Benign neoplasm of colon, unspecified: Secondary | ICD-10-CM

## 2012-10-12 DIAGNOSIS — I2510 Atherosclerotic heart disease of native coronary artery without angina pectoris: Secondary | ICD-10-CM

## 2012-10-12 DIAGNOSIS — I5042 Chronic combined systolic (congestive) and diastolic (congestive) heart failure: Secondary | ICD-10-CM | POA: Diagnosis present

## 2012-10-12 DIAGNOSIS — K922 Gastrointestinal hemorrhage, unspecified: Secondary | ICD-10-CM

## 2012-10-12 DIAGNOSIS — G609 Hereditary and idiopathic neuropathy, unspecified: Secondary | ICD-10-CM

## 2012-10-12 DIAGNOSIS — E538 Deficiency of other specified B group vitamins: Secondary | ICD-10-CM

## 2012-10-12 DIAGNOSIS — N400 Enlarged prostate without lower urinary tract symptoms: Secondary | ICD-10-CM | POA: Diagnosis present

## 2012-10-12 DIAGNOSIS — R142 Eructation: Secondary | ICD-10-CM

## 2012-10-12 DIAGNOSIS — R627 Adult failure to thrive: Secondary | ICD-10-CM | POA: Diagnosis present

## 2012-10-12 DIAGNOSIS — J449 Chronic obstructive pulmonary disease, unspecified: Secondary | ICD-10-CM

## 2012-10-12 DIAGNOSIS — F32A Depression, unspecified: Secondary | ICD-10-CM

## 2012-10-12 DIAGNOSIS — K219 Gastro-esophageal reflux disease without esophagitis: Secondary | ICD-10-CM

## 2012-10-12 DIAGNOSIS — R141 Gas pain: Secondary | ICD-10-CM

## 2012-10-12 DIAGNOSIS — R0602 Shortness of breath: Secondary | ICD-10-CM

## 2012-10-12 DIAGNOSIS — G92 Toxic encephalopathy: Secondary | ICD-10-CM | POA: Diagnosis present

## 2012-10-12 DIAGNOSIS — K648 Other hemorrhoids: Secondary | ICD-10-CM

## 2012-10-12 DIAGNOSIS — E876 Hypokalemia: Secondary | ICD-10-CM

## 2012-10-12 DIAGNOSIS — Z79899 Other long term (current) drug therapy: Secondary | ICD-10-CM

## 2012-10-12 DIAGNOSIS — Z8673 Personal history of transient ischemic attack (TIA), and cerebral infarction without residual deficits: Secondary | ICD-10-CM

## 2012-10-12 DIAGNOSIS — J441 Chronic obstructive pulmonary disease with (acute) exacerbation: Secondary | ICD-10-CM | POA: Diagnosis present

## 2012-10-12 DIAGNOSIS — D131 Benign neoplasm of stomach: Secondary | ICD-10-CM

## 2012-10-12 DIAGNOSIS — N390 Urinary tract infection, site not specified: Secondary | ICD-10-CM

## 2012-10-12 DIAGNOSIS — K2971 Gastritis, unspecified, with bleeding: Secondary | ICD-10-CM

## 2012-10-12 DIAGNOSIS — E440 Moderate protein-calorie malnutrition: Secondary | ICD-10-CM

## 2012-10-12 DIAGNOSIS — J9610 Chronic respiratory failure, unspecified whether with hypoxia or hypercapnia: Secondary | ICD-10-CM

## 2012-10-12 DIAGNOSIS — F3289 Other specified depressive episodes: Secondary | ICD-10-CM | POA: Diagnosis present

## 2012-10-12 DIAGNOSIS — I509 Heart failure, unspecified: Secondary | ICD-10-CM

## 2012-10-12 DIAGNOSIS — I10 Essential (primary) hypertension: Secondary | ICD-10-CM

## 2012-10-12 DIAGNOSIS — N179 Acute kidney failure, unspecified: Secondary | ICD-10-CM

## 2012-10-12 DIAGNOSIS — R7309 Other abnormal glucose: Secondary | ICD-10-CM

## 2012-10-12 DIAGNOSIS — I129 Hypertensive chronic kidney disease with stage 1 through stage 4 chronic kidney disease, or unspecified chronic kidney disease: Secondary | ICD-10-CM | POA: Diagnosis present

## 2012-10-12 DIAGNOSIS — Q211 Atrial septal defect: Secondary | ICD-10-CM

## 2012-10-12 DIAGNOSIS — R296 Repeated falls: Secondary | ICD-10-CM

## 2012-10-12 DIAGNOSIS — Q2111 Secundum atrial septal defect: Secondary | ICD-10-CM

## 2012-10-12 DIAGNOSIS — E785 Hyperlipidemia, unspecified: Secondary | ICD-10-CM

## 2012-10-12 DIAGNOSIS — R531 Weakness: Secondary | ICD-10-CM

## 2012-10-12 DIAGNOSIS — R5381 Other malaise: Secondary | ICD-10-CM

## 2012-10-12 DIAGNOSIS — Z87898 Personal history of other specified conditions: Secondary | ICD-10-CM

## 2012-10-12 DIAGNOSIS — R1311 Dysphagia, oral phase: Secondary | ICD-10-CM | POA: Diagnosis present

## 2012-10-12 DIAGNOSIS — K529 Noninfective gastroenteritis and colitis, unspecified: Secondary | ICD-10-CM

## 2012-10-12 DIAGNOSIS — F329 Major depressive disorder, single episode, unspecified: Secondary | ICD-10-CM | POA: Diagnosis present

## 2012-10-12 DIAGNOSIS — I5031 Acute diastolic (congestive) heart failure: Secondary | ICD-10-CM

## 2012-10-12 DIAGNOSIS — D72829 Elevated white blood cell count, unspecified: Secondary | ICD-10-CM

## 2012-10-12 DIAGNOSIS — J4489 Other specified chronic obstructive pulmonary disease: Secondary | ICD-10-CM | POA: Diagnosis present

## 2012-10-12 DIAGNOSIS — K209 Esophagitis, unspecified without bleeding: Secondary | ICD-10-CM

## 2012-10-12 DIAGNOSIS — I4891 Unspecified atrial fibrillation: Secondary | ICD-10-CM

## 2012-10-12 DIAGNOSIS — D649 Anemia, unspecified: Secondary | ICD-10-CM

## 2012-10-12 DIAGNOSIS — R1310 Dysphagia, unspecified: Secondary | ICD-10-CM

## 2012-10-12 DIAGNOSIS — J411 Mucopurulent chronic bronchitis: Secondary | ICD-10-CM

## 2012-10-12 DIAGNOSIS — G929 Unspecified toxic encephalopathy: Secondary | ICD-10-CM

## 2012-10-12 DIAGNOSIS — Z9049 Acquired absence of other specified parts of digestive tract: Secondary | ICD-10-CM

## 2012-10-12 DIAGNOSIS — I6350 Cerebral infarction due to unspecified occlusion or stenosis of unspecified cerebral artery: Secondary | ICD-10-CM | POA: Diagnosis present

## 2012-10-12 DIAGNOSIS — K222 Esophageal obstruction: Secondary | ICD-10-CM

## 2012-10-12 DIAGNOSIS — Z66 Do not resuscitate: Secondary | ICD-10-CM | POA: Diagnosis present

## 2012-10-12 DIAGNOSIS — E46 Unspecified protein-calorie malnutrition: Secondary | ICD-10-CM | POA: Diagnosis present

## 2012-10-12 DIAGNOSIS — K259 Gastric ulcer, unspecified as acute or chronic, without hemorrhage or perforation: Secondary | ICD-10-CM

## 2012-10-12 DIAGNOSIS — D631 Anemia in chronic kidney disease: Secondary | ICD-10-CM | POA: Diagnosis present

## 2012-10-12 DIAGNOSIS — K208 Other esophagitis without bleeding: Secondary | ICD-10-CM | POA: Diagnosis present

## 2012-10-12 LAB — POCT I-STAT TROPONIN I: Troponin i, poc: 0 ng/mL (ref 0.00–0.08)

## 2012-10-12 LAB — URINALYSIS, ROUTINE W REFLEX MICROSCOPIC
Bilirubin Urine: NEGATIVE
Nitrite: NEGATIVE
Specific Gravity, Urine: 1.016 (ref 1.005–1.030)
pH: 5 (ref 5.0–8.0)

## 2012-10-12 LAB — CBC WITH DIFFERENTIAL/PLATELET
Basophils Absolute: 0.1 10*3/uL (ref 0.0–0.1)
Basophils Relative: 1 % (ref 0–1)
Eosinophils Relative: 1 % (ref 0–5)
HCT: 33 % — ABNORMAL LOW (ref 39.0–52.0)
MCHC: 30.3 g/dL (ref 30.0–36.0)
MCV: 93 fL (ref 78.0–100.0)
Monocytes Absolute: 0.7 10*3/uL (ref 0.1–1.0)
RDW: 17.2 % — ABNORMAL HIGH (ref 11.5–15.5)

## 2012-10-12 LAB — CBC
Hemoglobin: 10.8 g/dL — ABNORMAL LOW (ref 13.0–17.0)
MCH: 28.6 pg (ref 26.0–34.0)
MCV: 92.6 fL (ref 78.0–100.0)
Platelets: 210 10*3/uL (ref 150–400)
RBC: 3.78 MIL/uL — ABNORMAL LOW (ref 4.22–5.81)
WBC: 6.3 10*3/uL (ref 4.0–10.5)

## 2012-10-12 LAB — PROTIME-INR
INR: 1.65 — ABNORMAL HIGH (ref 0.00–1.49)
Prothrombin Time: 19 seconds — ABNORMAL HIGH (ref 11.6–15.2)

## 2012-10-12 LAB — COMPREHENSIVE METABOLIC PANEL
ALT: 10 U/L (ref 0–53)
AST: 21 U/L (ref 0–37)
Calcium: 8.6 mg/dL (ref 8.4–10.5)
GFR calc Af Amer: 38 mL/min — ABNORMAL LOW (ref >90)
Glucose, Bld: 94 mg/dL (ref 70–99)
Sodium: 143 mEq/L (ref 135–145)
Total Protein: 6.8 g/dL (ref 6.0–8.3)

## 2012-10-12 LAB — URINE MICROSCOPIC-ADD ON

## 2012-10-12 MED ORDER — METOPROLOL TARTRATE 50 MG PO TABS
50.0000 mg | ORAL_TABLET | Freq: Two times a day (BID) | ORAL | Status: DC
Start: 2012-10-12 — End: 2012-10-14
  Administered 2012-10-12 – 2012-10-14 (×4): 50 mg via ORAL
  Filled 2012-10-12 (×5): qty 1

## 2012-10-12 MED ORDER — IPRATROPIUM BROMIDE 0.02 % IN SOLN
0.5000 mg | Freq: Four times a day (QID) | RESPIRATORY_TRACT | Status: DC
  Administered 2012-10-12 – 2012-10-14 (×7): 0.5 mg via RESPIRATORY_TRACT
  Filled 2012-10-12 (×7): qty 2.5

## 2012-10-12 MED ORDER — MAGNESIUM OXIDE 400 MG PO TABS: 400.0000 mg | ORAL_TABLET | Freq: Every day | ORAL | Status: DC

## 2012-10-12 MED ORDER — AMIODARONE HCL 200 MG PO TABS
200.0000 mg | ORAL_TABLET | Freq: Three times a day (TID) | ORAL | Status: DC
  Administered 2012-10-12 – 2012-10-14 (×5): 200 mg via ORAL
  Filled 2012-10-12 (×7): qty 1

## 2012-10-12 MED ORDER — ONDANSETRON HCL 4 MG/2ML IJ SOLN: 4.0000 mg | Freq: Four times a day (QID) | INTRAMUSCULAR | Status: DC | PRN

## 2012-10-12 MED ORDER — LEVALBUTEROL HCL 0.63 MG/3ML IN NEBU
0.6300 mg | INHALATION_SOLUTION | Freq: Four times a day (QID) | RESPIRATORY_TRACT | Status: DC | PRN
  Administered 2012-10-13: 0.63 mg via RESPIRATORY_TRACT
  Filled 2012-10-12: qty 3

## 2012-10-12 MED ORDER — PAROXETINE HCL 10 MG PO TABS
10.0000 mg | ORAL_TABLET | Freq: Every day | ORAL | Status: DC
  Administered 2012-10-13 – 2012-10-14 (×2): 10 mg via ORAL
  Filled 2012-10-12 (×2): qty 1

## 2012-10-12 MED ORDER — COUMADIN BOOK
Freq: Once | Status: AC
  Administered 2012-10-13: 11:00:00
  Filled 2012-10-12: qty 1

## 2012-10-12 MED ORDER — WARFARIN VIDEO
Freq: Once | Status: AC
  Administered 2012-10-13: 11:00:00

## 2012-10-12 MED ORDER — ONDANSETRON HCL 4 MG PO TABS: 4.0000 mg | ORAL_TABLET | Freq: Four times a day (QID) | ORAL | Status: DC | PRN

## 2012-10-12 MED ORDER — MAGNESIUM OXIDE 400 (241.3 MG) MG PO TABS
400.0000 mg | ORAL_TABLET | Freq: Every day | ORAL | Status: DC
  Administered 2012-10-12 – 2012-10-14 (×3): 400 mg via ORAL
  Filled 2012-10-12 (×4): qty 1

## 2012-10-12 MED ORDER — ACETAMINOPHEN 650 MG RE SUPP: 650.0000 mg | Freq: Four times a day (QID) | RECTAL | Status: DC | PRN

## 2012-10-12 MED ORDER — ACETAMINOPHEN 325 MG PO TABS: 650.0000 mg | ORAL_TABLET | Freq: Four times a day (QID) | ORAL | Status: DC | PRN

## 2012-10-12 MED ORDER — WARFARIN - PHARMACIST DOSING INPATIENT: Freq: Every day | Status: DC

## 2012-10-12 MED ORDER — MIRTAZAPINE 7.5 MG PO TABS
7.5000 mg | ORAL_TABLET | Freq: Every day | ORAL | Status: DC
Start: 2012-10-12 — End: 2012-10-14
  Administered 2012-10-12 – 2012-10-13 (×2): 7.5 mg via ORAL
  Filled 2012-10-12 (×3): qty 1

## 2012-10-12 MED ORDER — PANTOPRAZOLE SODIUM 40 MG PO TBEC
40.0000 mg | DELAYED_RELEASE_TABLET | Freq: Every day | ORAL | Status: DC
Start: 2012-10-12 — End: 2012-10-14
  Administered 2012-10-12 – 2012-10-14 (×3): 40 mg via ORAL
  Filled 2012-10-12 (×3): qty 1

## 2012-10-12 MED ORDER — FERROUS SULFATE 325 (65 FE) MG PO TABS
325.0000 mg | ORAL_TABLET | Freq: Two times a day (BID) | ORAL | Status: DC
  Administered 2012-10-12 – 2012-10-14 (×4): 325 mg via ORAL
  Filled 2012-10-12 (×6): qty 1

## 2012-10-12 MED ORDER — DUTASTERIDE 0.5 MG PO CAPS
0.5000 mg | ORAL_CAPSULE | Freq: Every day | ORAL | Status: DC
  Administered 2012-10-12 – 2012-10-14 (×3): 0.5 mg via ORAL
  Filled 2012-10-12 (×3): qty 1

## 2012-10-12 MED ORDER — MAGNESIUM OXIDE 400 (241.3 MG) MG PO TABS: 400.0000 mg | ORAL_TABLET | Freq: Every day | ORAL | Status: DC

## 2012-10-12 MED ORDER — SIMVASTATIN 20 MG PO TABS
20.0000 mg | ORAL_TABLET | Freq: Every day | ORAL | Status: DC
  Administered 2012-10-12 – 2012-10-13 (×2): 20 mg via ORAL
  Filled 2012-10-12 (×3): qty 1

## 2012-10-12 MED ORDER — SACCHAROMYCES BOULARDII 250 MG PO CAPS
250.0000 mg | ORAL_CAPSULE | Freq: Two times a day (BID) | ORAL | Status: DC
  Administered 2012-10-12 – 2012-10-14 (×4): 250 mg via ORAL
  Filled 2012-10-12 (×5): qty 1

## 2012-10-12 MED ORDER — BUDESONIDE-FORMOTEROL FUMARATE 160-4.5 MCG/ACT IN AERO
2.0000 | INHALATION_SPRAY | Freq: Two times a day (BID) | RESPIRATORY_TRACT | Status: DC
  Administered 2012-10-12 – 2012-10-14 (×4): 2 via RESPIRATORY_TRACT
  Filled 2012-10-12: qty 6

## 2012-10-12 MED ORDER — DEXTROSE 5 % IV SOLN
1.0000 g | Freq: Once | INTRAVENOUS | Status: AC
  Administered 2012-10-12: 1 g via INTRAVENOUS
  Filled 2012-10-12: qty 10

## 2012-10-12 MED ORDER — WARFARIN SODIUM 2 MG PO TABS
2.0000 mg | ORAL_TABLET | Freq: Once | ORAL | Status: AC
  Administered 2012-10-12: 2 mg via ORAL
  Filled 2012-10-12: qty 1

## 2012-10-12 MED ORDER — CALCIUM CARBONATE ANTACID 500 MG PO CHEW
1.0000 | CHEWABLE_TABLET | Freq: Three times a day (TID) | ORAL | Status: DC
  Administered 2012-10-12 – 2012-10-14 (×6): 200 mg via ORAL
  Filled 2012-10-12 (×8): qty 1

## 2012-10-12 MED ORDER — ENSURE COMPLETE PO LIQD
237.0000 mL | Freq: Three times a day (TID) | ORAL | Status: DC
  Administered 2012-10-13 – 2012-10-14 (×4): 237 mL via ORAL

## 2012-10-12 MED ORDER — SODIUM CHLORIDE 0.9 % IV SOLN
INTRAVENOUS | Status: DC
  Administered 2012-10-12: 1000 mL via INTRAVENOUS

## 2012-10-12 NOTE — Progress Notes (Signed)
MD progress note states pt not eating at home.  Pt has asked me for food several times this shift and has eaten everything he has been given.  Pt has been completely alert and oriented this shift. Barnett Hatter P

## 2012-10-12 NOTE — Progress Notes (Signed)
INITIAL NUTRITION ASSESSMENT  Pt meets criteria for severe MALNUTRITION in the context of chronic illness as evidenced by 15.7% weight loss in the past 6 months with <75% estimated energy intake for at least the past month per wife's report.  DOCUMENTATION CODES Per approved criteria  -Severe malnutrition in the context of chronic illness   INTERVENTION: - Ensure Complete TID - Encouraged increased PO intake - Will continue to monitor   NUTRITION DIAGNOSIS: Unintended weight loss related to poor appetite as evidenced by weight trend, pt report.   Goal: Pt to consume >90% of meals/supplements  Monitor:  Weights, labs, intake  Reason for Assessment: Consult  79 y.o. male  Admitting Dx: Toxic encephalopathy  ASSESSMENT: Pt with history of generalized weakness, general deconditioning, and failure to thrive. Pt admitted with weakness, hallucinations, and decreased appetite. This is pt's 4th admission in the past 6 months and has been seen by other RDs in past admissions. Pt's usual body weight is 180 pounds. Pt's weight in December 2013 was 166 pounds, now 140 pounds. Pt on Resource Breeze during admission in February 2014, however pt reports not liking it.   Met with pt who reports eating 2 meals/day at home and drinking 2-3 Ensure/day. Pt c/o poor appetite and unintended weight loss. Pt was eating lunch during RD visit, wearing dentures. Pt denies any problems chewing/swallowing. Per ED notes, wife reported pt's appetite has declined in the past month and worsened PTA.   Height: Ht Readings from Last 1 Encounters:  10/12/12 5\' 11"  (1.803 m)    Weight: Wt Readings from Last 1 Encounters:  10/12/12 140 lb (63.504 kg)    Ideal Body Weight: 172 lb  % Ideal Body Weight: 81  Wt Readings from Last 10 Encounters:  10/12/12 140 lb (63.504 kg)  09/30/12 154 lb (69.854 kg)  09/21/12 148 lb 12.8 oz (67.495 kg)  09/11/12 155 lb (70.308 kg)  09/03/12 146 lb 9.6 oz (66.497 kg)   08/28/12 146 lb (66.225 kg)  07/29/12 150 lb 2.1 oz (68.1 kg)  06/29/12 157 lb 3.2 oz (71.305 kg)  06/12/12 166 lb (75.297 kg)  06/02/12 164 lb 0.4 oz (74.4 kg)    Usual Body Weight: 180 lb  % Usual Body Weight: 78  BMI:  Body mass index is 19.53 kg/(m^2).  Estimated Nutritional Needs: Kcal: 1900-2250 Protein: 75-95g Fluid: 1.9-2.2L/day  Skin: +1 LLE edema, +2 RLE edema  Diet Order: Cardiac  EDUCATION NEEDS: -No education needs identified at this time  No intake or output data in the 24 hours ending 10/12/12 1707  Last BM: 5/11  Labs:   Recent Labs Lab 10/12/12 1238  NA 143  K 4.1  CL 105  CO2 31  BUN 45*  CREATININE 1.86*  CALCIUM 8.6  GLUCOSE 94    CBG (last 3)  No results found for this basename: GLUCAP,  in the last 72 hours  Scheduled Meds: . amiodarone  200 mg Oral TID  . budesonide-formoterol  2 puff Inhalation BID  . calcium carbonate  1 tablet Oral TID  . [START ON 10/13/2012] coumadin book   Does not apply Once  . dutasteride  0.5 mg Oral Daily  . ferrous sulfate  325 mg Oral BID WC  . ipratropium  0.5 mg Nebulization QID  . [START ON 10/13/2012] magnesium oxide  400 mg Oral Daily  . metoprolol  50 mg Oral BID  . mirtazapine  7.5 mg Oral QHS  . pantoprazole  40 mg Oral Daily  .  PARoxetine  10 mg Oral Daily  . saccharomyces boulardii  250 mg Oral BID  . simvastatin  20 mg Oral QHS  . warfarin  2 mg Oral ONCE-1800  . [START ON 10/13/2012] warfarin   Does not apply Once  . Warfarin - Pharmacist Dosing Inpatient   Does not apply q1800    Continuous Infusions: . sodium chloride      Past Medical History  Diagnosis Date  . History of upper gastrointestinal hemorrhage     dr. Dickie La, GI  . Cerebrovascular accident 41  . Gastric polyp   . Dysphagia, unspecified   . Coronary artery disease     a. Nonobstructive by cath 2009.  [name] Permanent atrial fibrillation     INR followed at [facility].  [name] Hypertension   . Esophagitis   . GERD  (gastroesophageal reflux disease)   . Erosive gastritis     H/o GIB felt secondary to this  . Gastric ulcer   . Hiatal hernia   . Internal hemorrhoids   . Hearing impairment   . Vision problems   . Chronic bronchitis     a. h/o resp failure in setting of bronchitis vs PNA 05/2012.  [name] Hyperlipidemia   . ASD (atrial septal defect)     a. s/p repair 1983.  [name] BPH (benign prostatic hyperplasia)   . COPD (chronic obstructive pulmonary disease)   . Anemia   . Depression   . LV dysfunction     a. EF 45-50% by echo 05/2012.  [name] Chronic diarrhea     Past Surgical History  Procedure Laterality Date  . Cholecystectomy  1990s  . Asd repair  [id number]  . Inguinal hernia repair  1963  . Partial small bowel resection  2005    ischemic?  [name] Enteroscopy  05/13/2012    Procedure: ENTEROSCOPY;  Surgeon: Louis Meckel, MD;  Location: [facility];  Service: Endoscopy;  Laterality: N/A;  . Cardioversion      possibly 3 times, dr. brody/cooper     Levon Hedger MS, RD, LDN [phone] Pager [phone] After Hours Pager

## 2012-10-12 NOTE — Clinical Social Work Psychosocial (Signed)
     Clinical Social Work Department BRIEF PSYCHOSOCIAL ASSESSMENT 10/12/2012  Patient:  [name], [name]     Account Number:  [id number]     Admit date:  10/12/2012  Clinical Social Worker:  Doree Albee  Date/Time:  10/12/2012 03:15 PM  Referred by:  CSW  Date Referred:  10/12/2012 Referred for  SNF Placement   Other Referral:   Interview type:  Patient Other interview type:    PSYCHOSOCIAL DATA Living Status:  FAMILY Admitted from facility:   Level of care:   Primary support name:  Dione Plover Primary support relationship to patient:  SPOUSE Degree of support available:   strong    CURRENT CONCERNS Current Concerns  Post-Acute Placement   Other Concerns:    SOCIAL WORK ASSESSMENT / PLAN CSW met with patient to complete psychosocial assessment. Patient alert and oritented to self and place, patient presented to ED however for confusion. CSW attempted to speak with pt spouse, however unable to reach by phone and not present in pt room.    Patient currently ilves at home with his wife. Per chart review, patient wife has had difficulty caring for patient at home. Per chart review, patient has had increased weakness, and not eating or drinking. Per chart review, patient may need placement for short term rehab once medically stable. Per chart review patient was admitted to [facility] for short term rehab on 07/29/2012    CSW awaiting further evaluation to determine pt disposition needs and discussion with pt spouse.   Assessment/plan status:  Psychosocial Support/Ongoing Assessment of Needs Other assessment/ plan:   Information/referral to community resources:   none identified at this time    PATIENTS/FAMILYS RESPONSE TO PLAN OF CARE: CSW plans to follow up wtih pt spouse to discuss dispotion needs once further medical evaluation completed. CSW unable to reach pt spouse by phone, and patient is unable to engage fully in assessment and disposition plans.

## 2012-10-12 NOTE — ED Provider Notes (Addendum)
History     CSN: [id number]  Arrival date & time 10/12/12  1044   None     Chief Complaint  Patient presents with  . Weakness    (Consider location/radiation/quality/duration/timing/severity/associated sxs/prior treatment) Patient is a 79 y.o. male presenting with weakness. The history is provided by a caregiver.  Weakness This is a recurrent problem. The current episode started 2 days ago. The problem occurs constantly. The problem has been gradually worsening. Pertinent negatives include no chest pain, no abdominal pain, no headaches and no shortness of breath. Associated symptoms comments: States for the last 24 hours he has been hallucinating and not acting himself. Also she noticed his urine being cloudy today. He usually walks with a walker but now is so weak he can only be in a wheelchair. She states his appetite has been decreasing for the last month but now he's not eating much at all.  This a.m. also having some vomiting. Nothing aggravates the symptoms. Nothing relieves the symptoms. He has tried nothing for the symptoms. The treatment provided no relief.    Past Medical History  Diagnosis Date  . History of upper gastrointestinal hemorrhage     dr. Dickie La, GI  . Cerebrovascular accident 74  . Gastric polyp   . Dysphagia, unspecified   . Coronary artery disease     a. Nonobstructive by cath 2009.  [name] Permanent atrial fibrillation     INR [facility] at [facility].  [name] Hypertension   . Esophagitis   . GERD (gastroesophageal reflux disease)   . Erosive gastritis     H/o GIB felt secondary to this  . Gastric ulcer   . Hiatal hernia   . Internal hemorrhoids   . Hearing impairment   . Vision problems   . Chronic bronchitis     a. h/o resp failure in setting of bronchitis vs PNA 05/2012.  [name] Hyperlipidemia   . ASD (atrial septal defect)     a. s/p repair 1983.  [name] BPH (benign prostatic hyperplasia)   . COPD (chronic obstructive pulmonary disease)   . Anemia   . Depression    . LV dysfunction     a. EF 45-50% by echo 05/2012.  [name] Chronic diarrhea     Past Surgical History  Procedure Laterality Date  . Cholecystectomy  1990s  . Asd repair  [id number]  . Inguinal hernia repair  1963  . Partial small bowel resection  2005    ischemic?  [name] Enteroscopy  05/13/2012    Procedure: ENTEROSCOPY;  Surgeon: Louis Meckel, MD;  Location: [facility];  Service: Endoscopy;  Laterality: N/A;  . Cardioversion      possibly 3 times, dr. brody/cooper    Family History  Problem Relation Age of Onset  . Heart disease Mother   . Heart disease Father   . Prostate cancer Brother     History  Substance Use Topics  . Smoking status: Former Smoker -- 1.00 packs/day for 20 years    Types: Cigarettes    Quit date: 06/13/1983  . Smokeless tobacco: Never Used  . Alcohol Use: No      Review of Systems  Constitutional: Negative for fever and chills.  Respiratory: Negative for cough and shortness of breath.   Cardiovascular: Negative for chest pain.  Gastrointestinal: Positive for vomiting. Negative for nausea, abdominal pain and diarrhea.  Neurological: Positive for weakness. Negative for headaches.  All other systems reviewed and are negative.    Allergies  Penicillins  Home Medications  Current Outpatient Rx  Name  Route  Sig  Dispense  Refill  . amiodarone (PACERONE) 200 MG tablet   Oral   Take 1 tablet (200 mg total) by mouth 3 (three) times daily.   90 tablet   1   . AVODART 0.5 MG capsule      TAKE (1) CAPSULE DAILY   90 capsule   3   . budesonide-formoterol (SYMBICORT) 160-4.5 MCG/ACT inhaler   Inhalation   Inhale 2 puffs into the lungs 2 (two) times daily.   1 Inhaler   1   . calcium carbonate (TUMS - DOSED IN MG ELEMENTAL CALCIUM) 500 MG chewable tablet   Oral   Chew 1 tablet by mouth 3 (three) times daily.         . ferrous sulfate 325 (65 FE) MG tablet   Oral   Take 1 tablet (325 mg total) by mouth 2 (two) times daily with a meal.    60 tablet   0   . furosemide (LASIX) 20 MG tablet   Oral   Take 20-40 mg by mouth 2 (two) times daily. 2 tabs in the morning 1 tablet in the after noon         . ipratropium (ATROVENT) 0.02 % nebulizer solution   Nebulization   Take 2.5 mLs (0.5 mg total) by nebulization 4 (four) times daily.   75 mL   2   . levalbuterol (XOPENEX) 0.63 MG/3ML nebulizer solution      1 vial in neb twice daily with Ipratropium.  May take extra neb every 4 hours if needed for wheezing/shortness of breath         . magnesium oxide (MAG-OX 400) 400 MG tablet   Oral   Take 1 tablet (400 mg total) by mouth daily.   180 tablet   4   . metoprolol (LOPRESSOR) 50 MG tablet      Take 1 tab at breakfast, 1 tab at lunch, and a half tab before bed.   75 tablet   1   . mirtazapine (REMERON) 7.5 MG tablet   Oral   Take 1 tablet (7.5 mg total) by mouth at bedtime.   90 tablet   1   . pantoprazole (PROTONIX) 40 MG tablet      One tab BID AC   60 tablet   11   . PARoxetine (PAXIL) 10 MG tablet   Oral   Take 1 tablet (10 mg total) by mouth every morning.   90 tablet   1   . potassium chloride SA (K-DUR,KLOR-CON) 20 MEQ tablet   Oral   Take 20 mEq by mouth 2 (two) times daily.         [name] saccharomyces boulardii (FLORASTOR) 250 MG capsule   Oral   Take 1 capsule (250 mg total) by mouth 2 (two) times daily.   60 capsule   2   . simvastatin (ZOCOR) 20 MG tablet   Oral   Take 20 mg by mouth at bedtime.         [name] warfarin (COUMADIN) 1 MG tablet      1-2 mg. Takes 1 mg every day except on Tuesday,thursday,saturday. All the other days pt takes 2 mg           BP 124/67  Pulse 53  Temp(Src) 97.9 F (36.6 C) (Oral)  Resp 18  Ht 5\' 11"  (1.803 m)  Wt 140 lb (63.504 kg)  BMI 19.53 kg/m2  SpO2 92%  Physical Exam  Nursing note and vitals reviewed. Constitutional: He is oriented to person, place, and time. He appears well-developed and well-nourished. No distress.  HENT:  Head:  Normocephalic and atraumatic.  Mouth/Throat: Oropharynx is clear and moist. No oropharyngeal exudate.  Eyes: EOM are normal. Pupils are equal, round, and reactive to light.  Pale conjunctiva  Neck: Normal range of motion. Neck supple.  Cardiovascular: Intact distal pulses.  An irregularly irregular rhythm present. Bradycardia present.   No murmur heard. Pulmonary/Chest: Effort normal and breath sounds normal. No respiratory distress. He has no wheezes. He has no rales.  Abdominal: Soft. He exhibits no distension. There is no tenderness. There is no rebound and no guarding.  Musculoskeletal: Normal range of motion. He exhibits edema. He exhibits no tenderness.  1+ edema to the midshin  Neurological: He is alert and oriented to person, place, and time.  Skin: Skin is warm and dry. No rash noted. No erythema. There is pallor.  Psychiatric: He has a normal mood and affect. His behavior is normal.    ED Course  Procedures (including critical care time)  Labs Reviewed  CBC WITH DIFFERENTIAL - Abnormal; Notable for the following:    RBC 3.55 (*)    Hemoglobin 10.0 (*)    HCT 33.0 (*)    RDW 17.2 (*)    All other components within normal limits  COMPREHENSIVE METABOLIC PANEL - Abnormal; Notable for the following:    BUN 45 (*)    Creatinine, Ser 1.86 (*)    Albumin 2.3 (*)    Total Bilirubin 0.2 (*)    GFR calc non Af Amer 33 (*)    GFR calc Af Amer 38 (*)    All other components within normal limits  URINALYSIS, ROUTINE W REFLEX MICROSCOPIC - Abnormal; Notable for the following:    APPearance CLOUDY (*)    Hgb urine dipstick MODERATE (*)    Leukocytes, UA LARGE (*)    All other components within normal limits  PROTIME-INR - Abnormal; Notable for the following:    Prothrombin Time 19.0 (*)    INR 1.65 (*)    All other components within normal limits  URINE MICROSCOPIC-ADD ON - Abnormal; Notable for the following:    Bacteria, UA MANY (*)    All other components within normal  limits  URINE CULTURE  POCT I-STAT TROPONIN I   Dg Chest 2 View  10/12/2012  *RADIOLOGY REPORT*  Clinical Data: Weakness.  COPD.  CHEST - 2 VIEW  Comparison: 02/20/2014and CT chest from 06/01/2012.  Findings: The cardiopericardial silhouette is enlarged. Enlargement of the pulmonary arteries again noted.  No evidence for pulmonary edema.  Interval progression of retrocardiac collapse / consolidation with small left pleural effusion.  Bones are diffusely demineralized. Telemetry leads overlie the chest.  IMPRESSION: Worsening left base collapse / consolidation with small left effusion.   Original Report Authenticated By: [name], M.D.      Date: 10/12/2012  Rate: 50  Rhythm: atrial fibrillation  QRS Axis: normal  Intervals: normal  ST/T Wave abnormalities: nonspecific ST/T changes  Conduction Disutrbances:right bundle branch block  Narrative Interpretation:   Old EKG Reviewed: unchanged    1. UTI (lower urinary tract infection)   2. Weakness       MDM   Patient with multiple medical problems and failure to thrive at home with worsening symptoms in the last 2 days with confusion per family member. No respiratory symptoms but she thought his urine appeared cloudy today.  Patient is awake and alert on exam with persistent bradycardia but otherwise normal vital signs. She denies any recent new medications other than the patient being pale his exam is unrevealing.  CBC, CMP, UA, PT/INR, chest x-ray Pending. EKG shows unchanged atrial fibrillation.   2:38 PM Creatinine mildly elevated from prior. UA today with signs of a urinary tract infection which would explain patient's new symptoms. Will admit as caregiver is having a hard time taking care of him at home.    Gwyneth Sprout, MD 10/12/12 1439  Gwyneth Sprout, MD 10/12/12 1500

## 2012-10-12 NOTE — ED Notes (Signed)
Pt with increased weakness, wife states unable to walk, confusion, not eating and drinking, wife states unable to take care of him at home at present, states he needs to be "built back up" before she can take care of him at home

## 2012-10-12 NOTE — H&P (Signed)
[facility] History and Physical  [name] NWG:[id number] DOB: 1933/01/06 DOA: 10/12/2012  Referring physician: Dr. Gwyneth Sprout PCP: Kristian Covey, MD   Chief Complaint: Weakness, hallucinations   History of Present Illness: [name] is an 79 y.o. male with a past medical history of multiple medical problems as detailed below, generalized weakness, general deconditioning, and failure to thrive, was brought to the emergency department today by his wife with weakness, hallucinations and decreased appetite. The wife is not currently present and the patient is unsure why he was brought here. A complete review of systems was done and he does endorse diminished appetite, chronic shortness of breath without change, and dysuria. No aggravating or alleviating factors.  Review of Systems: Constitutional: No fever, no chills;  Appetite diminished; No weight loss, no weight gain.  HEENT: No blurry vision, no diplopia, no pharyngitis, no dysphagia CV: No chest pain, no palpitations.  Resp: + Chronic SOB, no cough. GI: No nausea, no vomiting, no diarrhea, no melena, no hematochezia.  GU: + dysuria, no hematuria.  MSK: no myalgias, no arthralgias.  Neuro:  No headache, no focal neurological deficits, no history of seizures.  Psych: + depression, no anxiety.  Endo: No thyroid disease, no DM, no heat intolerance, no cold intolerance, no polyuria, no polydipsia  Skin: No rashes, no skin lesions.  Heme: No easy bruising, no history of blood diseases.  Past Medical History Past Medical History  Diagnosis Date  . History of upper gastrointestinal hemorrhage     dr. Dickie La, GI  . Cerebrovascular accident 42  . Gastric polyp   . Dysphagia, unspecified   . Coronary artery disease     a. Nonobstructive by cath 2009.  [name] Permanent atrial fibrillation     INR followed at [facility].  [name] Hypertension   . Esophagitis   . GERD (gastroesophageal reflux disease)   . Erosive gastritis     H/o GIB  felt secondary to this  . Gastric ulcer   . Hiatal hernia   . Internal hemorrhoids   . Hearing impairment   . Vision problems   . Chronic bronchitis     a. h/o resp failure in setting of bronchitis vs PNA 05/2012.  [name] Hyperlipidemia   . ASD (atrial septal defect)     a. s/p repair 1983.  [name] BPH (benign prostatic hyperplasia)   . COPD (chronic obstructive pulmonary disease)   . Anemia   . Depression   . LV dysfunction     a. EF 45-50% by echo 05/2012.  [name] Chronic diarrhea      Past Surgical History Past Surgical History  Procedure Laterality Date  . Cholecystectomy  1990s  . Asd repair  [id number]  . Inguinal hernia repair  1963  . Partial small bowel resection  2005    ischemic?  [name] Enteroscopy  05/13/2012    Procedure: ENTEROSCOPY;  Surgeon: Louis Meckel, MD;  Location: [facility];  Service: Endoscopy;  Laterality: N/A;  . Cardioversion      possibly 3 times, dr. brody/cooper     Social History: History   Social History  . Marital Status: Married    Spouse Name: N/A    Number of Children: N/A  . Years of Education: N/A   Occupational History  . Not on file.   Social History Main Topics  . Smoking status: Former Smoker -- 1.00 packs/day for 20 years    Types: Cigarettes    Quit date: 06/13/1983  . Smokeless tobacco:  Never Used  . Alcohol Use: No  . Drug Use: No  . Sexually Active: No   Other Topics Concern  . Not on file   Social History Narrative   Lives in [place] with wife.  Several falls recently, 4 in the last month.        Kieren Adkison  [phone]                Family History:  Family History  Problem Relation Age of Onset  . Heart disease Mother   . Heart disease Father   . Prostate cancer Brother     Allergies: Penicillins  Meds: Prior to Admission medications   Medication Sig Start Date End Date Taking? Authorizing Provider  amiodarone (PACERONE) 200 MG tablet Take 1 tablet (200 mg total) by mouth 3 (three) times daily.  09/04/12  Yes Kristian Covey, MD  AVODART 0.5 MG capsule TAKE (1) CAPSULE DAILY 07/01/12  Yes Kristian Covey, MD  budesonide-formoterol (SYMBICORT) 160-4.5 MCG/ACT inhaler Inhale 2 puffs into the lungs 2 (two) times daily. 07/29/12  Yes Renae Fickle, MD  calcium carbonate (TUMS - DOSED IN MG ELEMENTAL CALCIUM) 500 MG chewable tablet Chew 1 tablet by mouth 3 (three) times daily.   Yes Historical Provider, MD  ferrous sulfate 325 (65 FE) MG tablet Take 1 tablet (325 mg total) by mouth 2 (two) times daily with a meal. 05/15/12  Yes Belkys A Regalado, MD  furosemide (LASIX) 20 MG tablet Take 20-40 mg by mouth 2 (two) times daily. 2 tabs in the morning 1 tablet in the after noon 06/25/12  Yes Kristian Covey, MD  ipratropium (ATROVENT) 0.02 % nebulizer solution Take 2.5 mLs (0.5 mg total) by nebulization 4 (four) times daily. 05/15/12  Yes Belkys A Regalado, MD  levalbuterol (XOPENEX) 0.63 MG/3ML nebulizer solution 1 vial in neb twice daily with Ipratropium.  May take extra neb every 4 hours if needed for wheezing/shortness of breath 06/19/12  Yes Nyoka Cowden, MD  magnesium oxide (MAG-OX 400) 400 MG tablet Take 1 tablet (400 mg total) by mouth daily. 08/28/12  Yes Gordy Savers, MD  metoprolol (LOPRESSOR) 50 MG tablet Take 1 tab at breakfast, 1 tab at lunch, and a half tab before bed. 09/11/12  Yes Kristian Covey, MD  mirtazapine (REMERON) 7.5 MG tablet Take 1 tablet (7.5 mg total) by mouth at bedtime. 09/11/12  Yes Kristian Covey, MD  pantoprazole (PROTONIX) 40 MG tablet One tab BID [facility] 09/15/12  Yes Kristian Covey, MD  PARoxetine (PAXIL) 10 MG tablet Take 1 tablet (10 mg total) by mouth every morning. 09/11/12  Yes Kristian Covey, MD  potassium chloride SA (K-DUR,KLOR-CON) 20 MEQ tablet Take 20 mEq by mouth 2 (two) times daily.   Yes Historical Provider, MD  saccharomyces boulardii (FLORASTOR) 250 MG capsule Take 1 capsule (250 mg total) by mouth 2 (two) times daily. 09/04/12  Yes Kristian Covey, MD  simvastatin (ZOCOR) 20 MG tablet Take 20 mg by mouth at bedtime.   Yes Historical Provider, MD  warfarin (COUMADIN) 1 MG tablet 1-2 mg. Takes 1 mg every day except on Tuesday,thursday,saturday. All the other days pt takes 2 mg 09/01/12  Yes Baker Pierini, FNP    Physical Exam: Filed Vitals:   10/12/12 1212 10/12/12 1230 10/12/12 1430 10/12/12 1500  BP:  126/56 130/64 147/61  Pulse:  50 51 45  Temp:      TempSrc:  Resp:  16 19 17   Height: 5\' 11"  (1.803 m)     Weight: 63.504 kg (140 lb)     SpO2:  96% 93% 93%     Physical Exam: Blood pressure 147/61, pulse 45, temperature 97.9 F (36.6 C), temperature source Oral, resp. rate 17, height 5\' 11"  (1.803 m), weight 63.504 kg (140 lb), SpO2 93.00%. Gen: No acute distress. Head: Normocephalic, atraumatic. Eyes: PERRL, EOMI, sclerae nonicteric. Mouth: Oropharynx with dry mucous membranes. Patient is edentulous. Neck: Supple, no thyromegaly, no lymphadenopathy, no jugular venous distention. Chest: Lungs diminished at the bases. CV: Heart sounds are bradycardic and irregular. No murmurs, rubs, or gallops. Abdomen: Soft, nontender, nondistended with normal active bowel sounds. Extremities: Extremities are with 2-3+ doughy pitting edema. Skin: Warm and dry. Neuro: Alert and oriented times 1; patient has a left-sided facial droop, history of stroke. Generalized weakness, history of dysphasia. Psych: Mood and affect depressed/flat.  Labs on Admission:  Basic Metabolic Panel:  Recent Labs Lab 10/12/12 1238  NA 143  K 4.1  CL 105  CO2 31  GLUCOSE 94  BUN 45*  CREATININE 1.86*  CALCIUM 8.6   Liver Function Tests:  Recent Labs Lab 10/12/12 1238  AST 21  ALT 10  ALKPHOS 93  BILITOT 0.2*  PROT 6.8  ALBUMIN 2.3*   CBC:  Recent Labs Lab 10/12/12 1238  WBC 8.2  NEUTROABS 6.1  HGB 10.0*  HCT 33.0*  MCV 93.0  PLT 233    BNP (last 3 results)  Recent Labs  07/20/12 2155 07/23/12 0340  PROBNP  7119.0* 13477.0*    Radiological Exams on Admission: Dg Chest 2 View  10/12/2012  *RADIOLOGY REPORT*  Clinical Data: Weakness.  COPD.  CHEST - 2 VIEW  Comparison: 02/20/2014and CT chest from 06/01/2012.  Findings: The cardiopericardial silhouette is enlarged. Enlargement of the pulmonary arteries again noted.  No evidence for pulmonary edema.  Interval progression of retrocardiac collapse / consolidation with small left pleural effusion.  Bones are diffusely demineralized. Telemetry leads overlie the chest.  IMPRESSION: Worsening left base collapse / consolidation with small left effusion.   Original Report Authenticated By: [name], M.D.     EKG: Independently reviewed. Atrial fibrillation, bradycardia.  Assessment/Plan Principal Problem:   Toxic encephalopathy secondary to UTI -Admit and place on empiric Rocephin pending culture results. -Nursing staff to provide frequent reorientation. -PT/OT evaluations requested. Active Problems:   HYPERLIPIDEMIA -Continue Zocor.   History of CEREBROVASCULAR ACCIDENT -PT/OT evaluations requested. Control blood pressure and other modifiable risk factors.   GERD -Continue PPI therapy.   BENIGN PROSTATIC HYPERTROPHY, HX OF -Continue Avodart.   Atrial fibrillation with bradycardia -Continue beta blocker with parameters to hold for heart rate less than 65. -Continue Pacerone.   Dysphagia -Speech therapy evaluation requested.   COPD (chronic obstructive pulmonary disease) -Continue Symbicort and nebulized bronchodilators as needed.   Acute kidney injury / stage III chronic kidney disease -Hold Lasix and potassium. -Gently hydrate at 50 cc an hour x24 hours. Cautious, given history of heart failure.   Depression -Continue Remeron and Paxil.   Moderate protein-calorie malnutrition -Continue Remeron. -Dietitian consultation requested.   Hypertension -Continue beta blocker.   Code Status: DNR Family Communication: No family at  bedside. Disposition Plan: ? SNF.  Time spent: 1 hour.  Devonte Migues [facility] Pager [phone]  If 7PM-7AM, please contact night-coverage [email or web address] Password [facility] 10/12/2012, 3:17 PM

## 2012-10-12 NOTE — Progress Notes (Signed)
ANTICOAGULATION CONSULT NOTE - Initial Consult  Pharmacy Consult for Warfarin Indication: atrial fibrillation  Allergies  Allergen Reactions  . Penicillins     hives    Patient Measurements: Height: 5\' 11"  (180.3 cm) Weight: 140 lb (63.504 kg) IBW/kg (Calculated) : 75.3 Heparin Dosing Weight:   Vital Signs: Temp: 97.9 F (36.6 C) (05/12 1113) Temp src: Oral (05/12 1113) BP: 130/65 mmHg (05/12 1530) Pulse Rate: 45 (05/12 1500)  Labs:  Recent Labs  10/12/12 1238  HGB 10.0*  HCT 33.0*  PLT 233  LABPROT 19.0*  INR 1.65*  CREATININE 1.86*    Estimated Creatinine Clearance: 29.4 ml/min (by C-G formula based on Cr of 1.86).   Medical History: Past Medical History  Diagnosis Date  . History of upper gastrointestinal hemorrhage     dr. Dickie La, GI  . Cerebrovascular accident 16  . Gastric polyp   . Dysphagia, unspecified   . Coronary artery disease     a. Nonobstructive by cath 2009.  [name] Permanent atrial fibrillation     INR [facility] at [facility].  [name] Hypertension   . Esophagitis   . GERD (gastroesophageal reflux disease)   . Erosive gastritis     H/o GIB felt secondary to this  . Gastric ulcer   . Hiatal hernia   . Internal hemorrhoids   . Hearing impairment   . Vision problems   . Chronic bronchitis     a. h/o resp failure in setting of bronchitis vs PNA 05/2012.  [name] Hyperlipidemia   . ASD (atrial septal defect)     a. s/p repair 1983.  [name] BPH (benign prostatic hyperplasia)   . COPD (chronic obstructive pulmonary disease)   . Anemia   . Depression   . LV dysfunction     a. EF 45-50% by echo 05/2012.  [name] Chronic diarrhea     Medications:  Scheduled:  . amiodarone  200 mg Oral TID  . budesonide-formoterol  2 puff Inhalation BID  . calcium carbonate  1 tablet Oral TID  . [COMPLETED] cefTRIAXone (ROCEPHIN)  IV  1 g Intravenous Once  . dutasteride  0.5 mg Oral Daily  . ferrous sulfate  325 mg Oral BID WC  . ipratropium  0.5 mg Nebulization QID  .  magnesium oxide  400 mg Oral Daily  . metoprolol  50 mg Oral BID  . mirtazapine  7.5 mg Oral QHS  . pantoprazole  40 mg Oral Daily  . PARoxetine  10 mg Oral Daily  . saccharomyces boulardii  250 mg Oral BID  . simvastatin  20 mg Oral QHS   Infusions:  . sodium chloride     PRN: acetaminophen, acetaminophen, levalbuterol, ondansetron (ZOFRAN) IV, ondansetron  Assessment: 79 yo M with history of multiple medical problems. Has been on Warfarin PTA  (2mg  on T/Th/Sat and 1mg  other days). Now with generalized weakness and failure to thrive. Goal of Therapy:  INR= 2-3 Monitor platelets by anticoagulation protocol: Yes   Plan: 1.  Begin with Warfarin 2mg  tonight as INR below goal of 2-3.  2.  Protime INR in AM daily while on Warfarin 3.  Ordered Warfarin teaching video/book for tomorrow.   Loletta Specter 10/12/2012,4:23 PM

## 2012-10-13 DIAGNOSIS — D638 Anemia in other chronic diseases classified elsewhere: Secondary | ICD-10-CM | POA: Diagnosis present

## 2012-10-13 LAB — BASIC METABOLIC PANEL
CO2: 32 mEq/L (ref 19–32)
Calcium: 8.6 mg/dL (ref 8.4–10.5)
Chloride: 107 mEq/L (ref 96–112)
Glucose, Bld: 82 mg/dL (ref 70–99)
Sodium: 144 mEq/L (ref 135–145)

## 2012-10-13 LAB — CBC
HCT: 31.8 % — ABNORMAL LOW (ref 39.0–52.0)
MCH: 28.7 pg (ref 26.0–34.0)
MCV: 93 fL (ref 78.0–100.0)
Platelets: 224 10*3/uL (ref 150–400)
RBC: 3.42 MIL/uL — ABNORMAL LOW (ref 4.22–5.81)
WBC: 8.6 10*3/uL (ref 4.0–10.5)

## 2012-10-13 MED ORDER — DEXTROSE 5 % IV SOLN
1.0000 g | INTRAVENOUS | Status: DC
  Administered 2012-10-13 – 2012-10-14 (×2): 1 g via INTRAVENOUS
  Filled 2012-10-13 (×2): qty 10

## 2012-10-13 MED ORDER — WARFARIN SODIUM 2 MG PO TABS
2.0000 mg | ORAL_TABLET | Freq: Once | ORAL | Status: AC
  Administered 2012-10-13: 2 mg via ORAL
  Filled 2012-10-13: qty 1

## 2012-10-13 NOTE — Progress Notes (Signed)
Brief pharmacy note Rocephin   S/O: 79 yo admitted with weakness and hallucinations. Rocephin for UTI started  A/P D#2 Rocephin 1 Gm IV q24h for UTI. Will s/o as no renal adjustment necessary.   Gwen Her PharmD  [phone] 10/13/2012 10:07 AM

## 2012-10-13 NOTE — Progress Notes (Signed)
CSW met with pt at bedside to discuss SNF placement.  Pt alert and oriented and able to tell this CSW his name and that he was at [facility].  Pt reports that he lives with his wife and admits that he has had some falls at home.  CSW discussed recommendation for rehab at [facility].  Pt currently refusing SNF placement. Pt states that he had been to [facility] in the past, but does not feel that he needs rehab at this time. CSW expressed concern about pt safety at home given history of falls. Pt states he feels that he can manage at home with home health services as he had these services prior to admission.   CSW encouraged pt to continue to consider option for SNF for rehab and pt agreeable to CSW returning when pt wife present tomorrow to further discuss.  CSW to continue to follow and assist with disposition as appropriate. Pt is currently stating that he plans to return home and was not agreeable to SNF search at this time.  Jacklynn Lewis, MSW, LCSWA  Clinical Social Work [phone]

## 2012-10-13 NOTE — Evaluation (Signed)
Occupational Therapy Evaluation Patient Details Name: [name] MRN: [id number] DOB: 1933-04-26 Today's Date: 10/13/2012 Time: 1191-4782 OT Time Calculation (min): 24 min  OT Assessment / Plan / Recommendation Clinical Impression  This 79 year old man was admitted with weakness, decreased appetite and FTT.  His diagnosis is toxic encephalopathy due to UTI.  Pt reports he is set up/modified I with adls at baseline.  He is currently min guard overall.  He does report he has falled and has had some near falls.  Would benefit from skilled OT to increase safety and independence with adls.      OT Assessment  Patient needs continued OT Services    Follow Up Recommendations  SNF -- Pt really wants to go home.  He has had several falls/near falls and would benefit from SNF.  If pt does go home, then [facility]   Barriers to Discharge      Equipment Recommendations  None recommended by OT    Recommendations for Other Services    Frequency  Min 2X/week    Precautions / Restrictions Precautions Precautions: Fall Restrictions Weight Bearing Restrictions: No   Pertinent Vitals/Pain No c/o pain    ADL  Grooming: Set up Where Assessed - Grooming: Unsupported sitting Upper Body Bathing: Set up Where Assessed - Upper Body Bathing: Unsupported sitting Lower Body Bathing: Min guard Where Assessed - Lower Body Bathing: Supported sit to stand Upper Body Dressing: Minimal assistance (lines) Where Assessed - Upper Body Dressing: Unsupported sitting Lower Body Dressing: Min guard Where Assessed - Lower Body Dressing: Supported sit to Pharmacist, hospital: Counsellor Method: Surveyor, minerals:  (chair) Toileting - Architect and Hygiene: Min guard Where Assessed - Engineer, mining and Hygiene: Sit to stand from 3-in-1 or toilet Transfers/Ambulation Related to ADLs: only performed a couple of steps:  I didn't have a tall  enough walker ADL Comments: able to don socks, bil ankles are swollen    OT Diagnosis: Generalized weakness  OT Problem List: Decreased strength;Decreased activity tolerance;Impaired balance (sitting and/or standing) OT Treatment Interventions: Self-care/ADL training;DME and/or AE instruction;Therapeutic activities;Patient/family education;Balance training   OT Goals Acute Rehab OT Goals OT Goal Formulation: With patient/family Time For Goal Achievement: 10/27/12 Potential to Achieve Goals: Good ADL Goals Pt Will Perform Grooming: with supervision;Standing at sink (x 2 tasks) ADL Goal: Grooming - Progress: Goal set today Pt Will Transfer to Toilet: with supervision;Ambulation;3-in-1 ADL Goal: Toilet Transfer - Progress: Goal set today Pt Will Perform Toileting - Hygiene: with supervision;Sit to stand from 3-in-1/toilet ADL Goal: Toileting - Hygiene - Progress: Goal set today Miscellaneous OT Goals Miscellaneous OT Goal #1: Pt will perform LB adls with supervision, sit to stand OT Goal: Miscellaneous Goal #1 - Progress: Goal set today  Visit Information  Last OT Received On: 10/13/12 Assistance Needed: +1    Subjective Data  Subjective: Oh, I'd say it's been 2 weeks since I last fell.  I haven't been eating much Patient Stated Goal: home   Prior Functioning     Home Living Lives With: Spouse Type of Home: House Home Access: Stairs to enter Entrance Stairs-Number of Steps: 2 Home Layout: One level Bathroom Shower/Tub: Tub/shower unit (sponge bathe) Bathroom Toilet: Handicapped height Home Adaptive Equipment: Walker - rolling;Bedside commode/3-in-1 Prior Function Level of Independence: Independent with assistive device(s) (wife sets clothes out) Driving: No Vocation: Retired Musician: No difficulties         Vision/Perception Vision - History Baseline Vision:  (  pt reports vision OK--L eye more closed)   Cognition   Cognition Arousal/Alertness: Awake/alert Behavior During Therapy: WFL for tasks assessed/performed Overall Cognitive Status: Within Functional Limits for tasks assessed    Extremity/Trunk Assessment Right Upper Extremity Assessment RUE ROM/Strength/Tone: [facility] for tasks assessed Left Upper Extremity Assessment LUE ROM/Strength/Tone: WFL for tasks assessed     Mobility Transfers Transfers: Sit to Stand Sit to Stand: 4: Min guard;With armrests;From chair/3-in-1 Details for Transfer Assistance: no cues needed     Exercise     Balance Balance Balance Assessed: Yes Static Standing Balance Static Standing - Balance Support: Right upper extremity supported Static Standing - Level of Assistance: 5: Stand by assistance Static Standing - Comment/# of Minutes: 2 minutes   End of Session OT - End of Session Activity Tolerance: Patient tolerated treatment well Patient left: in chair;with call bell/phone within reach;with family/visitor present  GO     [name] 10/13/2012, 3:00 PM

## 2012-10-13 NOTE — Progress Notes (Signed)
ANTICOAGULATION CONSULT NOTE - Follow Up Pharmacy Consult for Warfarin Indication: atrial fibrillation  Allergies  Allergen Reactions  . Penicillins     hives    Patient Measurements: Height: 5\' 11"  (180.3 cm) Weight: 140 lb (63.504 kg) IBW/kg (Calculated) : 75.3  Vital Signs: Temp: 97.3 F (36.3 C) (05/13 0523) Temp src: Oral (05/13 0523) BP: 122/47 mmHg (05/13 0523) Pulse Rate: 63 (05/13 0523)  Labs:  Recent Labs  10/12/12 1238 10/12/12 1743 10/13/12 0330  HGB 10.0* 10.8* 9.8*  HCT 33.0* 35.0* 31.8*  PLT 233 210 224  LABPROT 19.0*  --  19.0*  INR 1.65*  --  1.65*  CREATININE 1.86*  --  1.54*    Estimated Creatinine Clearance: 35.5 ml/min (by C-G formula based on Cr of 1.54).   Medical History: Past Medical History  Diagnosis Date  . History of upper gastrointestinal hemorrhage     dr. Dickie La, GI  . Cerebrovascular accident 77  . Gastric polyp   . Dysphagia, unspecified   . Coronary artery disease     a. Nonobstructive by cath 2009.  [name] Permanent atrial fibrillation     INR followed at [facility].  [name] Hypertension   . Esophagitis   . GERD (gastroesophageal reflux disease)   . Erosive gastritis     H/o GIB felt secondary to this  . Gastric ulcer   . Hiatal hernia   . Internal hemorrhoids   . Hearing impairment   . Vision problems   . Chronic bronchitis     a. h/o resp failure in setting of bronchitis vs PNA 05/2012.  [name] Hyperlipidemia   . ASD (atrial septal defect)     a. s/p repair 1983.  [name] BPH (benign prostatic hyperplasia)   . COPD (chronic obstructive pulmonary disease)   . Anemia   . Depression   . LV dysfunction     a. EF 45-50% by echo 05/2012.  [name] Chronic diarrhea     Medications:  Scheduled:  . amiodarone  200 mg Oral TID  . budesonide-formoterol  2 puff Inhalation BID  . calcium carbonate  1 tablet Oral TID  . [COMPLETED] cefTRIAXone (ROCEPHIN)  IV  1 g Intravenous Once  . cefTRIAXone (ROCEPHIN)  IV  1 g Intravenous Q24H  . coumadin  book   Does not apply Once  . dutasteride  0.5 mg Oral Daily  . feeding supplement  237 mL Oral TID WC  . ferrous sulfate  325 mg Oral BID WC  . ipratropium  0.5 mg Nebulization QID  . magnesium oxide  400 mg Oral Daily  . metoprolol  50 mg Oral BID  . mirtazapine  7.5 mg Oral QHS  . pantoprazole  40 mg Oral Daily  . PARoxetine  10 mg Oral Daily  . saccharomyces boulardii  250 mg Oral BID  . simvastatin  20 mg Oral QHS  . [COMPLETED] warfarin  2 mg Oral ONCE-1800  . warfarin   Does not apply Once  . Warfarin - Pharmacist Dosing Inpatient   Does not apply q1800  . [DISCONTINUED] magnesium oxide  400 mg Oral Daily  . [DISCONTINUED] magnesium oxide  400 mg Oral Daily   Infusions:  . [DISCONTINUED] sodium chloride 1,000 mL (10/12/12 1819)   PRN: acetaminophen, acetaminophen, levalbuterol, ondansetron (ZOFRAN) IV, ondansetron  Assessment: 79 yo M admitted  5/12 w/ weakness, decreased po intake and AMS. Hx stroke and dysphagia on chronic warfarin PTA for hx Afib. Pharmacy asked to manage anticoagulation inpt.  Dose PTA:  2mg  on T/Th/Sat and 1mg  other days, last dose PTA 5/11  INR same as yesterday, was given 2mg  last night   Amiodarone PTA, continued inpt  Rocephin for UTI, no significant effect on INR expected  H/H down slightly, no bleeding reported  Goal of Therapy:  INR 2-3   Plan: Daily INR Warfarin 2mg  today  Gwen Her PharmD  [phone] 10/13/2012 10:00 AM

## 2012-10-13 NOTE — Progress Notes (Signed)
[facility] PROGRESS NOTE  JULIUS MATUS AVW:[id number] DOB: [birth year] DOA: 10/12/2012 PCP: Kristian Covey, MD  Brief narrative: [name] is an 79 y.o. male with a PMH of prior stroke, debility, hyperlipidemia, dysphasia, COPD, stage III chronic kidney disease, and atrial fibrillation who was admitted on 10/12/2012 with decreased oral intake, failure to thrive, and mental status changes. Evaluation in the ER showed urinalysis with signs of infection.  Assessment/Plan: Principal Problem:  Toxic encephalopathy secondary to UTI  -Admitted and placed on empiric Rocephin pending culture results.  -Nursing staff to provide frequent reorientation.  -PT/OT evaluations requested.  Active Problems:  Chronic systolic CHF -Check pro BNP.  ? Worsening CHF.  Consider ACE/ARB when renal function stable. -Saline lock IV. Normocytic anemia -Likely anemia related to chronic kidney disease given underlying stage III disease. -Hemoglobin stable. HYPERLIPIDEMIA  -Continue Zocor.  History of CEREBROVASCULAR ACCIDENT  -PT/OT evaluations requested. Control blood pressure and other modifiable risk factors.  GERD  -Continue PPI therapy.  BENIGN PROSTATIC HYPERTROPHY, HX OF  -Continue Avodart.  Atrial fibrillation with bradycardia  -Continue beta blocker with parameters to hold for heart rate less than 65.  -Continue Pacerone.  -Coumadin per pharmacy. Dysphagia  -Speech therapy evaluation requested.  COPD (chronic obstructive pulmonary disease)  -Continue Symbicort and nebulized bronchodilators as needed.  Acute kidney injury / stage III chronic kidney disease  -Continue to hold Lasix and potassium.  -Creatinine improved with gentle hydration. D/C IVF. -Baseline creatinine 1.2-1.5. Depression  -Continue Remeron and Paxil.  Moderate protein-calorie malnutrition  -Continue Remeron.  -Dietitian consultation requested.  Hypertension  -Continue beta blocker.   Code Status: DNR   Family Communication: No family at bedside. Artemis Loyal (wife) updated by telephone [phone].   Disposition Plan: ? SNF.   Medical Consultants:  None.  Other Consultants:  Physical therapy  Occupational therapy  Anti-infectives:  Rocephin 10/12/2012--->  HPI/Subjective: [name] is less lethargic today.  He tells me his energy level has been poor for the past 3 weeks.  He is losing weight and has not had much of an appetite.  He says his gait has been stable.  No dyspnea, cough, nausea or vomiting.  No complaints of pain.  Objective: Filed Vitals:   10/12/12 1530 10/12/12 1626 10/12/12 2051 10/13/12 0523  BP: 130/65 128/59 123/53 122/47  Pulse:  53 55 63  Temp:  98.1 F (36.7 C) 98.1 F (36.7 C) 97.3 F (36.3 C)  TempSrc:  Oral Oral Oral  Resp: 19 16 16 16   Height:  5\' 11"  (1.803 m)    Weight:  63.504 kg (140 lb)    SpO2:  98% 99% 96%    Intake/Output Summary (Last 24 hours) at 10/13/12 0725 Last data filed at 10/13/12 0600  Gross per 24 hour  Intake 1304.17 ml  Output   1001 ml  Net 303.17 ml    Exam: Gen:  NAD Cardiovascular:  Bradycardic, irregular Respiratory:  Lungs with crackles and focal wheeze left base Gastrointestinal:  Abdomen soft, NT/ND, + BS Extremities:  3+ edema  Data Reviewed: Basic Metabolic Panel:  Recent Labs Lab 10/12/12 1238 10/13/12 0330  NA 143 144  K 4.1 4.5  CL 105 107  CO2 31 32  GLUCOSE 94 82  BUN 45* 40*  CREATININE 1.86* 1.54*  CALCIUM 8.6 8.6   GFR Estimated Creatinine Clearance: 35.5 ml/min (by C-G formula based on Cr of 1.54). Liver Function Tests:  Recent Labs Lab 10/12/12 1238  AST 21  ALT  10  ALKPHOS 93  BILITOT 0.2*  PROT 6.8  ALBUMIN 2.3*   Coagulation profile  Recent Labs Lab 10/12/12 1238 10/13/12 0330  INR 1.65* 1.65*    CBC:  Recent Labs Lab 10/12/12 1238 10/12/12 1743 10/13/12 0330  WBC 8.2 6.3 8.6  NEUTROABS 6.1  --   --   HGB 10.0* 10.8* 9.8*  HCT 33.0* 35.0*  31.8*  MCV 93.0 92.6 93.0  PLT 233 210 224   BNP (last 3 results)  Recent Labs  07/20/12 2155 07/23/12 0340  PROBNP 7119.0* 13477.0*   Microbiology No results found for this or any previous visit (from the past 240 hour(s)).   Procedures and Diagnostic Studies: Dg Chest 2 View  10/12/2012  *RADIOLOGY REPORT*  Clinical Data: Weakness.  COPD.  CHEST - 2 VIEW  Comparison: 02/20/2014and CT chest from 06/01/2012.  Findings: The cardiopericardial silhouette is enlarged. Enlargement of the pulmonary arteries again noted.  No evidence for pulmonary edema.  Interval progression of retrocardiac collapse / consolidation with small left pleural effusion.  Bones are diffusely demineralized. Telemetry leads overlie the chest.  IMPRESSION: Worsening left base collapse / consolidation with small left effusion.   Original Report Authenticated By: [name], M.D.     Scheduled Meds: . amiodarone  200 mg Oral TID  . budesonide-formoterol  2 puff Inhalation BID  . calcium carbonate  1 tablet Oral TID  . cefTRIAXone (ROCEPHIN)  IV  1 g Intravenous Q24H  . coumadin book   Does not apply Once  . dutasteride  0.5 mg Oral Daily  . feeding supplement  237 mL Oral TID WC  . ferrous sulfate  325 mg Oral BID WC  . ipratropium  0.5 mg Nebulization QID  . magnesium oxide  400 mg Oral Daily  . metoprolol  50 mg Oral BID  . mirtazapine  7.5 mg Oral QHS  . pantoprazole  40 mg Oral Daily  . PARoxetine  10 mg Oral Daily  . saccharomyces boulardii  250 mg Oral BID  . simvastatin  20 mg Oral QHS  . warfarin   Does not apply Once  . Warfarin - Pharmacist Dosing Inpatient   Does not apply q1800   Continuous Infusions: . sodium chloride 1,000 mL (10/12/12 1819)    Time spent: 35 minutes.   LOS: 1 day   RAMA,CHRISTINA  [facility] Pager [phone].  If 8PM-8AM, please contact night-coverage at [email or web address], password [facility] 10/13/2012, 7:25 AM

## 2012-10-13 NOTE — Progress Notes (Signed)
ANTIBIOTIC CONSULT NOTE - INITIAL  Pharmacy Consult for Rocephin Indication: UTI  Allergies  Allergen Reactions  . Penicillins     hives    Patient Measurements: Height: 5\' 11"  (180.3 cm) Weight: 140 lb (63.504 kg) IBW/kg (Calculated) : 75.3   Vital Signs: Temp: 98.1 F (36.7 C) (05/12 2051) Temp src: Oral (05/12 2051) BP: 123/53 mmHg (05/12 2051) Pulse Rate: 55 (05/12 2051) Intake/Output from previous day: 05/12 0701 - 05/13 0700 In: 904.2 [P.O.:720; I.V.:184.2] Out: 676 [Urine:675; Stool:1] Intake/Output from this shift: Total I/O In: 630 [P.O.:480; I.V.:150] Out: 375 [Urine:375]  Labs:  Recent Labs  10/12/12 1238 10/12/12 1743  WBC 8.2 6.3  HGB 10.0* 10.8*  PLT 233 210  CREATININE 1.86*  --    Estimated Creatinine Clearance: 29.4 ml/min (by C-G formula based on Cr of 1.86). No results found for this basename: VANCOTROUGH, VANCOPEAK, VANCORANDOM, GENTTROUGH, GENTPEAK, GENTRANDOM, TOBRATROUGH, TOBRAPEAK, TOBRARND, AMIKACINPEAK, AMIKACINTROU, AMIKACIN,  in the last 72 hours   Microbiology: No results found for this or any previous visit (from the past 720 hour(s)).  Medical History: Past Medical History  Diagnosis Date  . History of upper gastrointestinal hemorrhage     dr. Dickie La, GI  . Cerebrovascular accident 26  . Gastric polyp   . Dysphagia, unspecified   . Coronary artery disease     a. Nonobstructive by cath 2009.  [name] Permanent atrial fibrillation     INR followed at [facility].  [name] Hypertension   . Esophagitis   . GERD (gastroesophageal reflux disease)   . Erosive gastritis     H/o GIB felt secondary to this  . Gastric ulcer   . Hiatal hernia   . Internal hemorrhoids   . Hearing impairment   . Vision problems   . Chronic bronchitis     a. h/o resp failure in setting of bronchitis vs PNA 05/2012.  [name] Hyperlipidemia   . ASD (atrial septal defect)     a. s/p repair 1983.  [name] BPH (benign prostatic hyperplasia)   . COPD (chronic obstructive  pulmonary disease)   . Anemia   . Depression   . LV dysfunction     a. EF 45-50% by echo 05/2012.  [name] Chronic diarrhea     Medications:  Scheduled:  . amiodarone  200 mg Oral TID  . budesonide-formoterol  2 puff Inhalation BID  . calcium carbonate  1 tablet Oral TID  . [COMPLETED] cefTRIAXone (ROCEPHIN)  IV  1 g Intravenous Once  . cefTRIAXone (ROCEPHIN)  IV  1 g Intravenous Q24H  . coumadin book   Does not apply Once  . dutasteride  0.5 mg Oral Daily  . feeding supplement  237 mL Oral TID WC  . ferrous sulfate  325 mg Oral BID WC  . ipratropium  0.5 mg Nebulization QID  . magnesium oxide  400 mg Oral Daily  . metoprolol  50 mg Oral BID  . mirtazapine  7.5 mg Oral QHS  . pantoprazole  40 mg Oral Daily  . PARoxetine  10 mg Oral Daily  . saccharomyces boulardii  250 mg Oral BID  . simvastatin  20 mg Oral QHS  . [COMPLETED] warfarin  2 mg Oral ONCE-1800  . warfarin   Does not apply Once  . Warfarin - Pharmacist Dosing Inpatient   Does not apply q1800  . [DISCONTINUED] magnesium oxide  400 mg Oral Daily  . [DISCONTINUED] magnesium oxide  400 mg Oral Daily   Infusions:  . sodium chloride 1,000  mL (10/12/12 1819)   Assessment: 79 yo admitted with weakness and hallucinations. Rocephin started in ER for UTI.  MD requesting maintenance dose per Rx.  Goal of Therapy:  Treat infection  Plan:   Rocephin 1gm IV q24h  F/U cultures  Lorenza Evangelist 10/13/2012,12:13 AM

## 2012-10-13 NOTE — Progress Notes (Signed)
CARE MANAGEMENT NOTE 10/13/2012  Patient:  [name], [name]   Account Number:  [id number]  Date Initiated:  10/13/2012  Documentation initiated by:  Freida Nebel  Subjective/Objective Assessment:   79 yo femaile with hx of cva, presents with ams, uti,fever and failed op treatment     Action/Plan:   from home may need snf placement per md note   Anticipated DC Date:  10/16/2012   Anticipated DC Plan:  HOME/SELF CARE  In-house referral  NA      DC Planning Services  NA      [facility] Choice  NA   Choice offered to / List presented to:  NA   DME arranged  NA      DME agency  NA     HH arranged  NA      HH agency  NA   Status of service:  In process, will continue to follow Medicare Important Message given?  NA - LOS <3 / Initial given by admissions (If response is "NO", the following Medicare IM given date fields will be blank) Date Medicare IM given:   Date Additional Medicare IM given:    Discharge Disposition:    Per UR Regulation:  Reviewed for med. necessity/level of care/duration of stay  If discussed at Long Length of Stay Meetings, dates discussed:    Comments:  [id number] Earlene Plater, RN, BSN, CCM:  CHART REVIEWED AND UPDATED.  Next chart review due on [id number]. NO DISCHARGE NEEDS PRESENT AT THIS TIME. CASE MANAGEMENT [phone]

## 2012-10-13 NOTE — Evaluation (Signed)
Clinical/Bedside Swallow Evaluation Patient Details  Name: [name] MRN: [id number] Date of Birth: 03/20/1933  Today's Date: 10/13/2012 Time: 1500-1550 SLP Time Calculation (min): 50 min  Past Medical History:  Past Medical History  Diagnosis Date  . History of upper gastrointestinal hemorrhage     dr. Dickie La, GI  . Cerebrovascular accident 48  . Gastric polyp   . Dysphagia, unspecified   . Coronary artery disease     a. Nonobstructive by cath 2009.  [name] Permanent atrial fibrillation     INR followed at [facility].  [name] Hypertension   . Esophagitis   . GERD (gastroesophageal reflux disease)   . Erosive gastritis     H/o GIB felt secondary to this  . Gastric ulcer   . Hiatal hernia   . Internal hemorrhoids   . Hearing impairment   . Vision problems   . Chronic bronchitis     a. h/o resp failure in setting of bronchitis vs PNA 05/2012.  [name] Hyperlipidemia   . ASD (atrial septal defect)     a. s/p repair 1983.  [name] BPH (benign prostatic hyperplasia)   . COPD (chronic obstructive pulmonary disease)   . Anemia   . Depression   . LV dysfunction     a. EF 45-50% by echo 05/2012.  [name] Chronic diarrhea    Past Surgical History:  Past Surgical History  Procedure Laterality Date  . Cholecystectomy  1990s  . Asd repair  [id number]  . Inguinal hernia repair  1963  . Partial small bowel resection  2005    ischemic?  [name] Enteroscopy  05/13/2012    Procedure: ENTEROSCOPY;  Surgeon: Louis Meckel, MD;  Location: [facility] ENDOSCOPY;  Service: Endoscopy;  Laterality: N/A;  . Cardioversion      possibly 3 times, dr. brody/cooper   HPI:  79 yo male adm to [facility] with UTI/encephalopathy.  Pt has had 4 admits in six months to the hospital.  PMH + for upper GI hemmorhage, CVA, gastric polyp, esophagitis, erosive esophagitis, GERD, Hiatal hernia, gastric ulcer, CAD.  Pt underwent endoscopic testing in Dec by GI with findings of erosive esophagitis and stricture at GE.  He was treated with medications with  improvement per pt report.  Pt states his swallowing has been at same level since treatment in December.  He also admits to occasionally feeling like he is choking.  Pt admits if he chews well, take small bites/sips that he tolerates intake well.  He does admit to decreased appetite.    Dg Chest 2 View  10/12/2012  *RADIOLOGY REPORT*  Clinical Data: Weakness.  COPD.  CHEST - 2 VIEW  Comparison: 02/20/2014and CT chest from 06/01/2012.  Findings: The cardiopericardial silhouette is enlarged. Enlargement of the pulmonary arteries again noted.  No evidence for pulmonary edema.  Interval progression of retrocardiac collapse / consolidation with small left pleural effusion.  Bones are diffusely demineralized. Telemetry leads overlie the chest.  IMPRESSION: Worsening left base collapse / consolidation with small left effusion.   Original Report Authenticated By: [name], M.D.     Assessment / Plan / Recommendation Clinical Impression  Pt's swallow ability appears consistent with findings from previous clinical evaluation Dec 2013.  Observed pt to consume single graham cracker, four ounces icecream and approx 2 ounces water.  Cough x1 noted at end of snack when pt consumed water - pt observed with further small boluses with better tolerance.  Pt admits to multiple reasons for poor intake including lack of appetite,  dysphagia causing him fear to eat, and difficulties masticating.   Suspect multifactorial dysphagia with oral deficits and possible pharyngeal deficits from his previous CVA and known GI issues.  SLP to follow up to educate spouse to precautions and mitigation strategies.  Pt agreeable to try to use denture adhesive to aid airway protection with solids- asked him to have wife bring in for him.  Pt admits to eating two meals /day, advised him to consider grazing t/o day to maximize intake.      Aspiration Risk       Diet Recommendation Regular;Thin liquid   Liquid Administration via:  Cup;Straw Medication Administration: Whole meds with puree (cracker before and after) Supervision: Patient able to self feed Compensations: Slow rate;Small sips/bites Postural Changes and/or Swallow Maneuvers: Seated upright 90 degrees;Upright 30-60 min after meal (consume liquids t/o meal to aid clearance)    Other  Recommendations Recommended Consults: MBS (consider MBS if desire to instrumentally evaluate swallow) Oral Care Recommendations: Oral care BID   Follow Up Recommendations  Skilled Nursing facility    Frequency and Duration    1 week   Pertinent Vitals/Pain Afebrile, decreased    SLP Swallow Goals Patient will utilize recommended strategies during swallow to increase swallowing safety with: Moderate cueing   Swallow Study Prior Functional Status   poor appetite, eating is laborious - not enjoyable for pt per his statement    General Date of Onset: 10/13/12 HPI: 79 yo male adm to [facility] with UTI/encephalopathy.  Pt has had 4 admits in six months to the hospital.  PMH + for upper GI hemmorhage, CVA, gastric polyp, esophagitis, erosive esophagitis, GERD, Hiatal hernia, gastric ulcer, CAD.  Pt underwent endoscopic testing in Dec by GI with findings of erosive esophagitis and stricture at GE.  He was treated with medications with improvement per pt report.  Pt states his swallowing has been at same level since treatment in December.  He also admits to occasionally feeling like he is choking.  Pt admits if he chews well, take small bites/sips that he tolerates intake well.  He does admit to decreased appetitie.   Type of Study: Bedside swallow evaluation Previous Swallow Assessment: clinical evaluation Dec 2013 Diet Prior to this Study: Regular;Thin liquids Temperature Spikes Noted: No Respiratory Status: Room air History of Recent Intubation: No Behavior/Cognition: Alert;Cooperative;Pleasant mood Oral Cavity - Dentition: Dentures, top;Dentures, bottom (ILL FITTING, SLP put  denture adhesive on teeth) Self-Feeding Abilities: Able to feed self Patient Positioning: Upright in bed Baseline Vocal Quality: Clear Volitional Cough: Strong Volitional Swallow: Able to elicit    Oral/Motor/Sensory Function Overall Oral Motor/Sensory Function: Impaired at baseline (bell's palsy impacted left side, right side impacted cva) Labial ROM: Reduced left Labial Symmetry: Abnormal symmetry left Labial Strength: Reduced Labial Sensation: Reduced Lingual Strength: Reduced (deviates to right upon protrusion) Facial ROM: Reduced left Facial Symmetry: Left droop Facial Strength: Reduced Facial Sensation: Reduced Velum: Within Functional Limits Mandible: Within Functional Limits   Ice Chips Ice chips: Not tested   Thin Liquid Thin Liquid: Impaired Presentation: Cup;Self Fed Oral Phase Impairments: Reduced lingual movement/coordination Pharyngeal  Phase Impairments: Suspected delayed Swallow;Cough - Immediate Other Comments: cough x1 with water, small single boluses appeared to be tolerated better    Nectar Thick Nectar Thick Liquid: Not tested   Honey Thick Honey Thick Liquid: Not tested   Puree Puree: Within functional limits Presentation: Self Fed;Spoon Other Comments: icecream, approx 4 ounces   Solid   GO    Solid: Impaired Presentation:  Self Fed Oral Phase Impairments: Impaired anterior to posterior transit;Reduced lingual movement/coordination Pharyngeal Phase Impairments: Suspected delayed Swallow Other Comments: pt took large cracker bolus, cues to take small bites, pt states he is aware of what he needs to do to maximize airway protection       Donavan Burnet, MS [facility] SLP [phone]

## 2012-10-13 NOTE — Progress Notes (Signed)
PT Cancellation Note  Patient Details Name: [name] MRN: [id number] DOB: [birth year]   Cancelled Treatment:    Reason Eval/Treat Not Completed: Other (comment) (pt has been up, now in bed and has dog visiting.)   Rada Hay 10/13/2012, 4:09 PM

## 2012-10-14 ENCOUNTER — Encounter: Payer: Medicare Other | Admitting: Family

## 2012-10-14 ENCOUNTER — Ambulatory Visit: Payer: Medicare Other | Admitting: Family Medicine

## 2012-10-14 DIAGNOSIS — I4891 Unspecified atrial fibrillation: Secondary | ICD-10-CM

## 2012-10-14 DIAGNOSIS — N179 Acute kidney failure, unspecified: Secondary | ICD-10-CM

## 2012-10-14 DIAGNOSIS — D638 Anemia in other chronic diseases classified elsewhere: Secondary | ICD-10-CM

## 2012-10-14 LAB — BASIC METABOLIC PANEL
CO2: 30 mEq/L (ref 19–32)
Chloride: 107 mEq/L (ref 96–112)
Glucose, Bld: 96 mg/dL (ref 70–99)
Potassium: 4.2 mEq/L (ref 3.5–5.1)
Sodium: 142 mEq/L (ref 135–145)

## 2012-10-14 LAB — PROTIME-INR: INR: 1.77 — ABNORMAL HIGH (ref 0.00–1.49)

## 2012-10-14 LAB — URINE CULTURE: Colony Count: >=100000

## 2012-10-14 LAB — MRSA PCR SCREENING: MRSA by PCR: POSITIVE — AB

## 2012-10-14 MED ORDER — CHLORHEXIDINE GLUCONATE CLOTH 2 % EX PADS: 6.0000 | MEDICATED_PAD | Freq: Every day | CUTANEOUS | Status: DC

## 2012-10-14 MED ORDER — MUPIROCIN 2 % EX OINT
1.0000 "application " | TOPICAL_OINTMENT | Freq: Two times a day (BID) | CUTANEOUS | Status: DC
  Administered 2012-10-14: 1 via NASAL

## 2012-10-14 MED ORDER — WARFARIN SODIUM 2 MG PO TABS
2.0000 mg | ORAL_TABLET | Freq: Once | ORAL | Status: DC
  Filled 2012-10-14: qty 1

## 2012-10-14 MED ORDER — LEVOFLOXACIN 500 MG PO TABS: 500.0000 mg | ORAL_TABLET | Freq: Every day | ORAL | Status: DC

## 2012-10-14 MED ORDER — MUPIROCIN 2 % EX OINT
TOPICAL_OINTMENT | Freq: Two times a day (BID) | CUTANEOUS | Status: DC
  Filled 2012-10-14: qty 22

## 2012-10-14 MED ORDER — MUPIROCIN 2 % EX OINT: 1.0000 "application " | TOPICAL_OINTMENT | Freq: Two times a day (BID) | CUTANEOUS | Status: DC

## 2012-10-14 NOTE — Progress Notes (Signed)
ANTICOAGULATION CONSULT NOTE - Follow Up Pharmacy Consult for Warfarin Indication: atrial fibrillation  Allergies  Allergen Reactions  . Penicillins     hives    Patient Measurements: Height: 5\' 11"  (180.3 cm) Weight: 140 lb (63.504 kg) IBW/kg (Calculated) : 75.3  Vital Signs: Temp: 98.2 F (36.8 C) (05/14 0535) Temp src: Oral (05/14 0535) BP: 128/67 mmHg (05/14 0535) Pulse Rate: 67 (05/14 0535)  Labs:  Recent Labs  10/12/12 1238 10/12/12 1743 10/13/12 0330 10/14/12 0305  HGB 10.0* 10.8* 9.8*  --   HCT 33.0* 35.0* 31.8*  --   PLT 233 210 224  --   LABPROT 19.0*  --  19.0* 20.0*  INR 1.65*  --  1.65* 1.77*  CREATININE 1.86*  --  1.54* 1.52*    Estimated Creatinine Clearance: 36 ml/min (by C-G formula based on Cr of 1.52).  Assessment: 79 yo M admitted  5/12 w/ weakness, decreased po intake and AMS. Hx stroke and dysphagia on chronic warfarin PTA for hx Afib. Pharmacy asked to manage anticoagulation inpt.  Dose PTA: 2mg  on T/Th/Sat and 1mg  other days, last dose PTA 5/11  INR subtherapeutic but rising after 2mg  x 2 days.   Amiodarone PTA, continued inpt.  Rocephin for UTI, no significant effect on INR expected.  No bleeding reported. Protonix.  Goal of Therapy:  INR 2-3   Plan:  Warfarin 2mg  today.   Daily INR.  Charolotte Eke, PharmD, pager [phone]. 10/14/2012,10:33 AM.

## 2012-10-14 NOTE — Care Management Note (Signed)
Cm spoke with patient at bedside concerning discharge planning. Per PT eval, HHPT upon discharge. Pt offered choice of HH agencies in [place]. Per pt choice [facility] to provide [facility] services upon discharge. [facility] rep Kristen notified. Pt states having RW & BSC for home use. Pt states lives home with spouse, Adult daughter lives next door. No other needs identified at this time.  Roxy Manns Audriana Aldama,RN,BSN [phone]

## 2012-10-14 NOTE — Progress Notes (Signed)
Patient has very fragile skin.gets skin tear easily. Noticed skin tear in LAC 1 cm and RAC from iv tape 3cm long

## 2012-10-14 NOTE — Progress Notes (Signed)
CSW reviewed chart and noted that pt ambulated 200 feet supervision today during PT treatment.  Pt had declined SNF yesterday and appears not recommendation has changed to Home Health PT for safety eval.   CSW updated RNCM.  CSW signing off at this time as pt had declined SNF placement and is no recommended for Home Health PT.  Please re-consult if further social work needs arise.  Jacklynn Lewis, MSW, LCSWA  Clinical Social Work [phone]

## 2012-10-14 NOTE — Discharge Summary (Signed)
Physician Discharge Summary  [name]:[id number] DOB: 01-Sep-1932 DOA: 10/12/2012  PCP: Kristian Covey, MD  Admit date: 10/12/2012 Discharge date: 10/14/2012  Recommendations for Outpatient Follow-up:  1. Please take Coumadin 2 mg tonight. Please recheck INR with PCP 10/15/2012 and have them readjust the Coumadin based on INR. 2. Take Levaquin for 5 days for urinary tract infection. 3. I have made an appt for the patient with his PCP tomorrow 10/15/12 at 9:20 am  Discharge Diagnoses:  Principal Problem:   Toxic encephalopathy secondary to UTI Active Problems:   HYPERLIPIDEMIA   History of CEREBROVASCULAR ACCIDENT   GERD   BENIGN PROSTATIC HYPERTROPHY, HX OF   Atrial fibrillation with bradycardia   Dysphagia   COPD (chronic obstructive pulmonary disease)   Acute kidney injury   Depression   Moderate protein-calorie malnutrition   Stage III chronic kidney disease   Anemia of chronic disease  Discharge Condition: medically stable for discharge home with HHPT today  Diet recommendation: as tolerated  History of present illness:  79 y.o. male with a PMH of prior stroke, debility, hyperlipidemia, dysphasia, COPD, stage III chronic kidney disease, and atrial fibrillation who was admitted on 10/12/2012 with decreased oral intake, failure to thrive, and mental status changes. Patient was admitted with working diagnosis of urinary tract infection.   Assessment/Plan:   Principal Problem:  Toxic encephalopathy secondary to UTI  - Admitted and placed on empiric Rocephin pending culture results. Final urine culture results are still pending but preliminary results show gram-negative rods. Patient insists ongoing home today. Patient had urinary tract infection and urine culture growing Klebsiella recently at the end of February 2014 and her sensitivity report then patient was sensitive to Levaquin. I will discharge patient with Levaquin 500 mg daily for next 5 days on discharge.  Patient was instructed to followup with PCP in case his symptoms recur - Per physical therapy evaluation, recommendation is for skilled nursing facility but patient declines this offer at this time - Order placed for home health physical therapy  Active Problems:  Chronic systolic and diastolic CHF  - Patient does not appear to be short of breath. Please note that BNP on this admission is 2422 which is much better than previous value of 7119 in 07/2012 - Patient instructed to follow with PCP Normocytic anemia  - Secondary to chronic disease, chronic kidney disease - Hemoglobin is stable at 9.8 - No signs of active bleed HYPERLIPIDEMIA  - Continue Zocor.  GERD  - Continue PPI therapy.  BENIGN PROSTATIC HYPERTROPHY, HX OF  - Continue Avodart.  Atrial fibrillation with bradycardia  - Continue metoprolol 50 mg twice daily - Continue amiodarone - Continue Coumadin, patient instructed to take 2 mg tonight and followup INR with PCP to readjust the Coumadin as indicated based on INR range Dysphagia  - Based on swallow evaluation patient can continue regular and thin liquid diet  COPD (chronic obstructive pulmonary disease)  - Continue Symbicort and nebulized bronchodilators as needed.  Acute kidney injury / stage III chronic kidney disease  - Continue to hold Lasix and potassium.  - Creatinine improved with gentle hydration. D/C IVF.  - Baseline creatinine 1.2-1.5.  Depression  - Continue Remeron and Paxil.  Moderate protein-calorie malnutrition  - Continue Remeron.  - Dietitian consultation requested.  Hypertension  - Continue beta blocker.   Code Status: DNR  Family Communication: No family at bedside. Monti Jilek (wife) updated by telephone [phone].    Medical Consultants:  None. Other Consultants:  Physical therapy  Occupational therapy Anti-infectives:  Rocephin 10/12/2012--->10/14/2012 Levaquin 10/14/2012 500 mg daily for 5 days on discharge   Discharge Exam: Filed  Vitals:   10/14/12 1408  BP: 116/64  Pulse: 60  Temp: 98.2 F (36.8 C)  Resp: 18   Filed Vitals:   10/13/12 2101 10/14/12 0535 10/14/12 0734 10/14/12 1408  BP: 140/67 128/67  116/64  Pulse: 73 67  60  Temp: 98.3 F (36.8 C) 98.2 F (36.8 C)  98.2 F (36.8 C)  TempSrc: Oral Oral  Oral  Resp: 18 16  18   Height:      Weight:      SpO2: 95% 96% 95% 99%    General: Pt is alert, follows commands appropriately, not in acute distress Cardiovascular: Regular rate and rhythm, S1/S2 +, no murmurs, no rubs, no gallops Respiratory: Clear to auscultation bilaterally, no wheezing, no crackles, no rhonchi Abdominal: Soft, non tender, non distended, bowel sounds +, no guarding Extremities: no edema, no cyanosis, pulses palpable bilaterally DP and PT Neuro: Grossly nonfocal  Discharge Instructions  Discharge Orders   Future Orders Complete By Expires     Call MD for:  difficulty breathing, headache or visual disturbances  As directed     Call MD for:  persistant dizziness or light-headedness  As directed     Call MD for:  persistant nausea and vomiting  As directed     Call MD for:  severe uncontrolled pain  As directed     Diet - low sodium heart healthy  As directed     Discharge instructions  As directed     Comments:      1. please take Coumadin 2 mg tonight. Please recheck INR with PCP 10/15/2012 and have them readjust the Coumadin based on INR 2. take Levaquin for 5 days for urinary tract infection 3. Hold lasix until you recheck kidney function with PCP and creatinine around baseline of 1.2 - 1.5    Increase activity slowly  As directed         Medication List    TAKE these medications       amiodarone 200 MG tablet  Commonly known as:  PACERONE  Take 1 tablet (200 mg total) by mouth 3 (three) times daily.     AVODART 0.5 MG capsule  Generic drug:  dutasteride  TAKE (1) CAPSULE DAILY     budesonide-formoterol 160-4.5 MCG/ACT inhaler  Commonly known as:  SYMBICORT   Inhale 2 puffs into the lungs 2 (two) times daily.     calcium carbonate 500 MG chewable tablet  Commonly known as:  TUMS - dosed in mg elemental calcium  Chew 1 tablet by mouth 3 (three) times daily.     ferrous sulfate 325 (65 FE) MG tablet  Take 1 tablet (325 mg total) by mouth 2 (two) times daily with a meal.        ipratropium 0.02 % nebulizer solution  Commonly known as:  ATROVENT  Take 2.5 mLs (0.5 mg total) by nebulization 4 (four) times daily.     levalbuterol 0.63 MG/3ML nebulizer solution  Commonly known as:  XOPENEX  1 vial in neb twice daily with Ipratropium.  May take extra neb every 4 hours if needed for wheezing/shortness of breath     levofloxacin 500 MG tablet  Commonly known as:  LEVAQUIN  Take 1 tablet (500 mg total) by mouth daily.     magnesium oxide 400 MG tablet  Commonly known as:  MAG-OX  400  Take 1 tablet (400 mg total) by mouth daily.     metoprolol 50 MG tablet  Commonly known as:  LOPRESSOR  Take 1 tab at breakfast, 1 tab at lunch, and a half tab before bed.     mirtazapine 7.5 MG tablet  Commonly known as:  REMERON  Take 1 tablet (7.5 mg total) by mouth at bedtime.     pantoprazole 40 MG tablet  Commonly known as:  PROTONIX  One tab BID AC     PARoxetine 10 MG tablet  Commonly known as:  PAXIL  Take 1 tablet (10 mg total) by mouth every morning.     saccharomyces boulardii 250 MG capsule  Commonly known as:  FLORASTOR  Take 1 capsule (250 mg total) by mouth 2 (two) times daily.     simvastatin 20 MG tablet  Commonly known as:  ZOCOR  Take 20 mg by mouth at bedtime.     warfarin 1 MG tablet  Commonly known as:  COUMADIN  1-2 mg. Takes 1 mg every day except on Tuesday,thursday,saturday. All the other days pt takes 2 mg           Follow-up Information   Follow up with Kristian Covey, MD On 10/15/2012. (check INR)    Contact information:   [address] Christena Flake [facility] [place] [address] [phone]        The results of  significant diagnostics from this hospitalization (including imaging, microbiology, ancillary and laboratory) are listed below for reference.    Significant Diagnostic Studies: Dg Chest 2 View  10/12/2012   *RADIOLOGY REPORT*  Clinical Data: Weakness.  COPD.  CHEST - 2 VIEW  Comparison: 02/20/2014and CT chest from 06/01/2012.  Findings: The cardiopericardial silhouette is enlarged. Enlargement of the pulmonary arteries again noted.  No evidence for pulmonary edema.  Interval progression of retrocardiac collapse / consolidation with small left pleural effusion.  Bones are diffusely demineralized. Telemetry leads overlie the chest.  IMPRESSION: Worsening left base collapse / consolidation with small left effusion.   Original Report Authenticated By: [name], M.D.    Microbiology: Recent Results (from the past 240 hour(s))  URINE CULTURE     Status: None   Collection Time    10/12/12  1:38 PM      Result Value Range Status   Specimen Description URINE, CLEAN CATCH   Final   Special Requests NONE   Final   Culture  Setup Time 10/12/2012 23:04   Final   Colony Count >=100,000 COLONIES/ML   Final   Culture GRAM NEGATIVE RODS   Final   Report Status PENDING   Incomplete  MRSA PCR SCREENING     Status: Abnormal   Collection Time    10/14/12 12:12 AM      Result Value Range Status   MRSA by PCR POSITIVE (*) NEGATIVE Final     Labs: Basic Metabolic Panel:  Recent Labs Lab 10/12/12 1238 10/13/12 0330 10/14/12 0305  NA 143 144 142  K 4.1 4.5 4.2  CL 105 107 107  CO2 31 32 30  GLUCOSE 94 82 96  BUN 45* 40* 30*  CREATININE 1.86* 1.54* 1.52*  CALCIUM 8.6 8.6 8.2*   Liver Function Tests:  Recent Labs Lab 10/12/12 1238  AST 21  ALT 10  ALKPHOS 93  BILITOT 0.2*  PROT 6.8  ALBUMIN 2.3*   No results found for this basename: LIPASE, AMYLASE,  in the last 168 hours No results found for this basename: AMMONIA,  in the last 168 hours CBC:  Recent Labs Lab 10/12/12 1238  10/12/12 1743 10/13/12 0330  WBC 8.2 6.3 8.6  NEUTROABS 6.1  --   --   HGB 10.0* 10.8* 9.8*  HCT 33.0* 35.0* 31.8*  MCV 93.0 92.6 93.0  PLT 233 210 224   Cardiac Enzymes: No results found for this basename: CKTOTAL, CKMB, CKMBINDEX, TROPONINI,  in the last 168 hours BNP: BNP (last 3 results)  Recent Labs  07/20/12 2155 07/23/12 0340 10/13/12 0330  PROBNP 7119.0* 13477.0* 2422.0*   CBG: No results found for this basename: GLUCAP,  in the last 168 hours  Time coordinating discharge: Over 30 minutes  Signed:  Manson Passey, MD  [facility]  10/14/2012, 2:14 PM  Pager #: [phone]

## 2012-10-14 NOTE — Evaluation (Signed)
Physical Therapy Evaluation Patient Details Name: [name] MRN: [id number] DOB: 1932-10-29 Today's Date: 10/14/2012 Time: 7829-5621 PT Time Calculation (min): 16 min  PT Assessment / Plan / Recommendation Clinical Impression  79 yo male admitted 10/12/12 for toxic encephalopathy due to UTI, ams. Pt ambulated x 200 ' in hall. Pt will benefit fromPT while in acute care. Pt desires to go home. Pt did well with RW. Wife not preasent.  rec. HHPT for safety eval.    PT Assessment  Patient needs continued PT services    Follow Up Recommendations  Home health PT    Does the patient have the potential to tolerate intense rehabilitation      Barriers to Discharge        Equipment Recommendations  None recommended by PT    Recommendations for Other Services     Frequency Min 3X/week    Precautions / Restrictions     Pertinent Vitals/Pain       Mobility  Bed Mobility Bed Mobility: Not assessed Transfers Transfers: Stand to Sit Sit to Stand: 4: Min guard;With armrests;From chair/3-in-1 Stand to Sit: 5: Supervision;To chair/3-in-1;With upper extremity assist;With armrests Details for Transfer Assistance: no cues needed Ambulation/Gait Ambulation/Gait Assistance: 5: Supervision Ambulation Distance (Feet): 200 Feet Assistive device: Rolling walker Ambulation/Gait Assistance Details: pt manages RW safely Gait Pattern: Step-through pattern;Right steppage    Exercises     PT Diagnosis: Abnormality of gait  PT Problem List: Decreased strength;Decreased activity tolerance;Decreased mobility;Decreased knowledge of precautions PT Treatment Interventions: DME instruction;Gait training;Functional mobility training;Stair training;Therapeutic activities;Therapeutic exercise;Patient/family education   PT Goals Acute Rehab PT Goals PT Goal Formulation: With patient Time For Goal Achievement: 10/28/12 Potential to Achieve Goals: Good Pt will go Sit to Stand: with modified  independence PT Goal: Sit to Stand - Progress: Goal set today Pt will go Stand to Sit: with modified independence PT Goal: Stand to Sit - Progress: Goal set today Pt will Ambulate: >150 feet;with modified independence PT Goal: Ambulate - Progress: Goal set today Pt will Go Up / Down Stairs: 1-2 stairs;with supervision;with least restrictive assistive device PT Goal: Up/Down Stairs - Progress: Goal set today Pt will Perform Home Exercise Program: Independently PT Goal: Perform Home Exercise Program - Progress: Goal set today  Visit Information  Last PT Received On: 10/14/12    Subjective Data  Subjective: I can get around a little . Patient Stated Goal: to go home today   Prior Functioning  Home Living Lives With: Spouse Type of Home: House Home Access: Stairs to enter Home Layout: One level Bathroom Shower/Tub: Engineer, manufacturing systems: Handicapped height Home Adaptive Equipment: Walker - rolling;Bedside commode/3-in-1 Prior Function Level of Independence: Independent with assistive device(s) Driving: No Vocation: Retired Musician: No difficulties    Copywriter, advertising Arousal/Alertness: Awake/alert Behavior During Therapy: WFL for tasks assessed/performed Overall Cognitive Status: Within Functional Limits for tasks assessed    Extremity/Trunk Assessment Right Lower Extremity Assessment RLE ROM/Strength/Tone: Deficits RLE ROM/Strength/Tone Deficits: trace dorsiflexion RLE Sensation: WFL - Light Touch Left Lower Extremity Assessment LLE ROM/Strength/Tone: WFL for tasks assessed Trunk Assessment Trunk Assessment: Kyphotic   Balance Static Standing Balance Static Standing - Balance Support: Right upper extremity supported Static Standing - Level of Assistance: 5: Stand by assistance  End of Session PT - End of Session Equipment Utilized During Treatment: Gait belt Activity Tolerance: Patient tolerated treatment well Patient left: in chair   GP     Rada Hay 10/14/2012, 9:22 AM Blanchard Kelch PT  [phone]  

## 2012-10-15 ENCOUNTER — Encounter: Payer: Medicare Other | Admitting: Family

## 2012-10-15 ENCOUNTER — Ambulatory Visit: Payer: Medicare Other | Admitting: Family Medicine

## 2012-10-16 ENCOUNTER — Ambulatory Visit (INDEPENDENT_AMBULATORY_CARE_PROVIDER_SITE_OTHER): Payer: Medicare Other | Admitting: Family

## 2012-10-16 DIAGNOSIS — I4891 Unspecified atrial fibrillation: Secondary | ICD-10-CM

## 2012-10-16 LAB — POCT INR: INR: 1.5

## 2012-10-16 NOTE — Patient Instructions (Addendum)
Today and tomorrow, take 2 tabs.  Then increase Coumadin to 2 tabs on Sunday, Tuesdays, Thursdays, and Saturdays. All other days, 1 tab.  Anticoagulation Dose Instructions as of 10/16/2012     [name] Tue Wed Thu Fri Sat   New Dose 2 mg 1 mg 2 mg 1 mg 2 mg 1 mg 2 mg    Description       Today and tomorrow, take 2 tabs.  Then increase Coumadin to 2 tabs on Sunday, Tuesdays, Thursdays, and Saturdays. All other days, 1 tab.

## 2012-10-21 ENCOUNTER — Encounter (HOSPITAL_COMMUNITY): Payer: Self-pay | Admitting: Emergency Medicine

## 2012-10-21 ENCOUNTER — Emergency Department (HOSPITAL_COMMUNITY)
Admission: EM | Admit: 2012-10-21 | Discharge: 2012-10-21 | Disposition: A | Discharge status: Home / Self Care | Payer: Medicare Other | Attending: Emergency Medicine | Admitting: Emergency Medicine

## 2012-10-21 ENCOUNTER — Emergency Department (HOSPITAL_COMMUNITY): Payer: Medicare Other

## 2012-10-21 DIAGNOSIS — I2510 Atherosclerotic heart disease of native coronary artery without angina pectoris: Secondary | ICD-10-CM | POA: Insufficient documentation

## 2012-10-21 DIAGNOSIS — Z860100 Personal history of colon polyps, unspecified: Secondary | ICD-10-CM | POA: Insufficient documentation

## 2012-10-21 DIAGNOSIS — Y9289 Other specified places as the place of occurrence of the external cause: Secondary | ICD-10-CM | POA: Insufficient documentation

## 2012-10-21 DIAGNOSIS — Z8709 Personal history of other diseases of the respiratory system: Secondary | ICD-10-CM | POA: Insufficient documentation

## 2012-10-21 DIAGNOSIS — IMO0002 Reserved for concepts with insufficient information to code with codable children: Secondary | ICD-10-CM | POA: Insufficient documentation

## 2012-10-21 DIAGNOSIS — J4489 Other specified chronic obstructive pulmonary disease: Secondary | ICD-10-CM | POA: Insufficient documentation

## 2012-10-21 DIAGNOSIS — K219 Gastro-esophageal reflux disease without esophagitis: Secondary | ICD-10-CM | POA: Insufficient documentation

## 2012-10-21 DIAGNOSIS — H547 Unspecified visual loss: Secondary | ICD-10-CM | POA: Insufficient documentation

## 2012-10-21 DIAGNOSIS — S0990XA Unspecified injury of head, initial encounter: Secondary | ICD-10-CM

## 2012-10-21 DIAGNOSIS — S199XXA Unspecified injury of neck, initial encounter: Secondary | ICD-10-CM | POA: Insufficient documentation

## 2012-10-21 DIAGNOSIS — Z8673 Personal history of transient ischemic attack (TIA), and cerebral infarction without residual deficits: Secondary | ICD-10-CM | POA: Insufficient documentation

## 2012-10-21 DIAGNOSIS — M542 Cervicalgia: Secondary | ICD-10-CM

## 2012-10-21 DIAGNOSIS — H9190 Unspecified hearing loss, unspecified ear: Secondary | ICD-10-CM | POA: Insufficient documentation

## 2012-10-21 DIAGNOSIS — S4980XA Other specified injuries of shoulder and upper arm, unspecified arm, initial encounter: Secondary | ICD-10-CM | POA: Insufficient documentation

## 2012-10-21 DIAGNOSIS — Z8679 Personal history of other diseases of the circulatory system: Secondary | ICD-10-CM | POA: Insufficient documentation

## 2012-10-21 DIAGNOSIS — E785 Hyperlipidemia, unspecified: Secondary | ICD-10-CM | POA: Insufficient documentation

## 2012-10-21 DIAGNOSIS — W010XXA Fall on same level from slipping, tripping and stumbling without subsequent striking against object, initial encounter: Secondary | ICD-10-CM | POA: Insufficient documentation

## 2012-10-21 DIAGNOSIS — Z79899 Other long term (current) drug therapy: Secondary | ICD-10-CM | POA: Insufficient documentation

## 2012-10-21 DIAGNOSIS — J449 Chronic obstructive pulmonary disease, unspecified: Secondary | ICD-10-CM | POA: Insufficient documentation

## 2012-10-21 DIAGNOSIS — S46909A Unspecified injury of unspecified muscle, fascia and tendon at shoulder and upper arm level, unspecified arm, initial encounter: Secondary | ICD-10-CM | POA: Insufficient documentation

## 2012-10-21 DIAGNOSIS — N400 Enlarged prostate without lower urinary tract symptoms: Secondary | ICD-10-CM | POA: Insufficient documentation

## 2012-10-21 DIAGNOSIS — Z8711 Personal history of peptic ulcer disease: Secondary | ICD-10-CM | POA: Insufficient documentation

## 2012-10-21 DIAGNOSIS — F3289 Other specified depressive episodes: Secondary | ICD-10-CM | POA: Insufficient documentation

## 2012-10-21 DIAGNOSIS — F329 Major depressive disorder, single episode, unspecified: Secondary | ICD-10-CM | POA: Insufficient documentation

## 2012-10-21 DIAGNOSIS — Z880 Allergy status to penicillin: Secondary | ICD-10-CM | POA: Insufficient documentation

## 2012-10-21 DIAGNOSIS — S0993XA Unspecified injury of face, initial encounter: Secondary | ICD-10-CM | POA: Insufficient documentation

## 2012-10-21 DIAGNOSIS — I10 Essential (primary) hypertension: Secondary | ICD-10-CM | POA: Insufficient documentation

## 2012-10-21 DIAGNOSIS — Z8719 Personal history of other diseases of the digestive system: Secondary | ICD-10-CM | POA: Insufficient documentation

## 2012-10-21 DIAGNOSIS — Z87891 Personal history of nicotine dependence: Secondary | ICD-10-CM | POA: Insufficient documentation

## 2012-10-21 DIAGNOSIS — K2960 Other gastritis without bleeding: Secondary | ICD-10-CM | POA: Insufficient documentation

## 2012-10-21 DIAGNOSIS — Z7901 Long term (current) use of anticoagulants: Secondary | ICD-10-CM | POA: Insufficient documentation

## 2012-10-21 DIAGNOSIS — D649 Anemia, unspecified: Secondary | ICD-10-CM | POA: Insufficient documentation

## 2012-10-21 DIAGNOSIS — Y9389 Activity, other specified: Secondary | ICD-10-CM | POA: Insufficient documentation

## 2012-10-21 LAB — CBC WITH DIFFERENTIAL/PLATELET
Basophils Absolute: 0 10*3/uL (ref 0.0–0.1)
Eosinophils Relative: 2 % (ref 0–5)
Lymphocytes Relative: 15 % (ref 12–46)
Lymphs Abs: 1.4 10*3/uL (ref 0.7–4.0)
MCV: 93.1 fL (ref 78.0–100.0)
Neutro Abs: 6.6 10*3/uL (ref 1.7–7.7)
Neutrophils Relative %: 73 % (ref 43–77)
Platelets: 267 10*3/uL (ref 150–400)
RBC: 3.17 MIL/uL — ABNORMAL LOW (ref 4.22–5.81)
WBC: 9.1 10*3/uL (ref 4.0–10.5)

## 2012-10-21 LAB — URINALYSIS, ROUTINE W REFLEX MICROSCOPIC
Nitrite: NEGATIVE
Specific Gravity, Urine: 1.016 (ref 1.005–1.030)
Urobilinogen, UA: 0.2 mg/dL (ref 0.0–1.0)
pH: 5 (ref 5.0–8.0)

## 2012-10-21 LAB — BASIC METABOLIC PANEL
CO2: 28 mEq/L (ref 19–32)
Chloride: 106 mEq/L (ref 96–112)
Glucose, Bld: 96 mg/dL (ref 70–99)
Potassium: 4.8 mEq/L (ref 3.5–5.1)
Sodium: 139 mEq/L (ref 135–145)

## 2012-10-21 LAB — URINE MICROSCOPIC-ADD ON

## 2012-10-21 LAB — PROTIME-INR
INR: 1.5 — ABNORMAL HIGH (ref 0.00–1.49)
Prothrombin Time: 17.7 seconds — ABNORMAL HIGH (ref 11.6–15.2)

## 2012-10-21 MED ORDER — SODIUM CHLORIDE 0.9 % IV BOLUS (SEPSIS): 1000.0000 mL | Freq: Once | INTRAVENOUS | Status: DC

## 2012-10-21 NOTE — ED Notes (Signed)
Patient transported to CT 

## 2012-10-21 NOTE — ED Notes (Signed)
IV fluids on hold until CXR and CT completed. EDP advised

## 2012-10-21 NOTE — ED Notes (Signed)
Pt and wife report that he fell at 0100, while trying to get out of bed.Pt went back to bed after bleeding was brought under control.  Pt c/o r/shoulder pain, multiple skin tears on  both arms. Referred to ED by Dr Caryl Never. Pt currently alert and appropriate

## 2012-10-21 NOTE — ED Provider Notes (Signed)
History     CSN: [id number]  Arrival date & time 10/21/12  1641   First MD Initiated Contact with Patient 10/21/12 1732      Chief Complaint  Patient presents with  . Fall    fell onto floor last night  . Head Injury    r/forehead bandage-due to laceration 18 hrs ago  . Shoulder Pain    fell 18 hrs ago,   . Abrasion    multiple skin tears after fall    (Consider location/radiation/quality/duration/timing/severity/associated sxs/prior treatment) The history is provided by the patient and the spouse.   is reported the patient got up to go to bathroom last night and fell and tripped.  He injured his forehead.  He's on Coumadin for history of atrial fibrillation.  He is brought to the emergency department after discussions with his primary care physician and nurse over the phone.  He denies headache.  No nausea or vomiting.  He does report some posterior neck pain.  He has no weakness his upper lower extremities.  No chest pain or shortness of breath.  No abdominal pain.  No nausea vomiting or diarrhea.  No fevers or chills.  He had multiple skin tears his bilateral upper extremities which his wife artery repaired with bandages at home.  He has been ambulatory since the fall.  He denies hip pain.  No altered mental status per his wife.  He's had multiple falls over the past year and this is not new nor surprising for his family  Past Medical History  Diagnosis Date  . History of upper gastrointestinal hemorrhage     dr. Dickie La, GI  . Cerebrovascular accident 61  . Gastric polyp   . Dysphagia, unspecified   . Coronary artery disease     a. Nonobstructive by cath 2009.  [name] Permanent atrial fibrillation     INR followed at [facility].  [name] Hypertension   . Esophagitis   . GERD (gastroesophageal reflux disease)   . Erosive gastritis     H/o GIB felt secondary to this  . Gastric ulcer   . Hiatal hernia   . Internal hemorrhoids   . Hearing impairment   . Vision problems   . Chronic  bronchitis     a. h/o resp failure in setting of bronchitis vs PNA 05/2012.  [name] Hyperlipidemia   . ASD (atrial septal defect)     a. s/p repair 1983.  [name] BPH (benign prostatic hyperplasia)   . COPD (chronic obstructive pulmonary disease)   . Anemia   . Depression   . LV dysfunction     a. EF 45-50% by echo 05/2012.  [name] Chronic diarrhea     Past Surgical History  Procedure Laterality Date  . Cholecystectomy  1990s  . Asd repair  [id number]  . Inguinal hernia repair  1963  . Partial small bowel resection  2005    ischemic?  [name] Enteroscopy  05/13/2012    Procedure: ENTEROSCOPY;  Surgeon: Louis Meckel, MD;  Location: [facility];  Service: Endoscopy;  Laterality: N/A;  . Cardioversion      possibly 3 times, dr. brody/cooper    Family History  Problem Relation Age of Onset  . Heart disease Mother   . Heart disease Father   . Prostate cancer Brother     History  Substance Use Topics  . Smoking status: Former Smoker -- 1.00 packs/day for 20 years    Types: Cigarettes    Quit date: 06/13/1983  . Smokeless  tobacco: Never Used  . Alcohol Use: No      Review of Systems  All other systems reviewed and are negative.    Allergies  Penicillins  Home Medications   Current Outpatient Rx  Name  Route  Sig  Dispense  Refill  . amiodarone (PACERONE) 200 MG tablet   Oral   Take 1 tablet (200 mg total) by mouth 3 (three) times daily.   90 tablet   1   . AVODART 0.5 MG capsule      TAKE (1) CAPSULE DAILY   90 capsule   3   . budesonide-formoterol (SYMBICORT) 160-4.5 MCG/ACT inhaler   Inhalation   Inhale 2 puffs into the lungs 2 (two) times daily.   1 Inhaler   1   . calcium carbonate (TUMS - DOSED IN MG ELEMENTAL CALCIUM) 500 MG chewable tablet   Oral   Chew 1 tablet by mouth 3 (three) times daily.         . ferrous sulfate 325 (65 FE) MG tablet   Oral   Take 1 tablet (325 mg total) by mouth 2 (two) times daily with a meal.   60 tablet   0   . ipratropium  (ATROVENT) 0.02 % nebulizer solution   Nebulization   Take 2.5 mLs (0.5 mg total) by nebulization 4 (four) times daily.   75 mL   2   . levalbuterol (XOPENEX) 0.63 MG/3ML nebulizer solution      1 vial in neb twice daily with Ipratropium.  May take extra neb every 4 hours if needed for wheezing/shortness of breath         . metoprolol (LOPRESSOR) 50 MG tablet      Take 1 tab at breakfast, 1 tab at lunch, and a half tab before bed.   75 tablet   1   . mirtazapine (REMERON) 7.5 MG tablet   Oral   Take 1 tablet (7.5 mg total) by mouth at bedtime.   90 tablet   1   . pantoprazole (PROTONIX) 40 MG tablet      One tab BID AC   60 tablet   11   . PARoxetine (PAXIL) 10 MG tablet   Oral   Take 1 tablet (10 mg total) by mouth every morning.   90 tablet   1   . simvastatin (ZOCOR) 20 MG tablet   Oral   Take 20 mg by mouth at bedtime.         [name] warfarin (COUMADIN) 1 MG tablet      1-2 mg. Takes 1 mg every day except on Tuesday,thursday,saturday. All the other days pt takes 2 mg         . levofloxacin (LEVAQUIN) 500 MG tablet   Oral   Take 1 tablet (500 mg total) by mouth daily.   5 tablet   0   . magnesium oxide (MAG-OX 400) 400 MG tablet   Oral   Take 1 tablet (400 mg total) by mouth daily.   180 tablet   4   . saccharomyces boulardii (FLORASTOR) 250 MG capsule   Oral   Take 1 capsule (250 mg total) by mouth 2 (two) times daily.   60 capsule   2     BP 135/57  Pulse 54  Temp(Src) 98.2 F (36.8 C) (Oral)  Resp 20  Wt 160 lb (72.576 kg)  BMI 22.33 kg/m2  SpO2 97%  Physical Exam  Nursing note and vitals reviewed. Constitutional: He is  oriented to person, place, and time. He appears well-developed and well-nourished.  HENT:  Head: Normocephalic and atraumatic.  Eyes: EOM are normal.  Neck: Normal range of motion.  Cardiovascular: Normal rate, regular rhythm, normal heart sounds and intact distal pulses.   Pulmonary/Chest: Effort normal and breath  sounds normal. No respiratory distress.  Abdominal: Soft. He exhibits no distension. There is no tenderness. There is no rebound and no guarding.  Genitourinary: Rectum normal.  Musculoskeletal: Normal range of motion.  Full range of motion bilateral ankles knees and hips.  Full range of motion of bilateral wrists elbows and shoulders.  Multiple bandage skin tears of his bilateral upper extremities  Neurological: He is alert and oriented to person, place, and time.  Skin: Skin is warm and dry.  Psychiatric: He has a normal mood and affect. Judgment normal.    ED Course  Procedures (including critical care time)  Labs Reviewed  CBC WITH DIFFERENTIAL - Abnormal; Notable for the following:    RBC 3.17 (*)    Hemoglobin 9.3 (*)    HCT 29.5 (*)    RDW 17.1 (*)    All other components within normal limits  BASIC METABOLIC PANEL - Abnormal; Notable for the following:    BUN 29 (*)    Creatinine, Ser 1.82 (*)    GFR calc non Af Amer 34 (*)    GFR calc Af Amer 39 (*)    All other components within normal limits  PROTIME-INR - Abnormal; Notable for the following:    Prothrombin Time 17.7 (*)    INR 1.50 (*)    All other components within normal limits  URINALYSIS, ROUTINE W REFLEX MICROSCOPIC   Dg Chest 2 View  10/21/2012   *RADIOLOGY REPORT*  Clinical Data: Larey Seat onto floor last night, head injury, former smoker, hypertension  CHEST - 2 VIEW  Comparison: 10/12/2012  Findings: Enlargement of cardiac silhouette post median sternotomy. Enlargement of hila particularly right hilum again seen. Pulmonary vascularity normal. Left lower lobe atelectasis versus consolidation with small left pleural effusion. Minimal atelectasis versus scarring right base. Upper lungs clear. No pneumothorax. Diffuse osseous demineralization. Skin folds project over left chest.  IMPRESSION: Enlargement of cardiac silhouette post median sternotomy. Persistent hilar enlargement unchanged from prior exam, suspect related to  enlarged central pulmonary arteries and pulmonary arterial hypertension. Left lower lobe atelectasis versus consolidation and small left pleural effusion unchanged since 10/12/2012.   Original Report Authenticated By: Ulyses Southward, M.D.   Ct Head Wo Contrast  10/21/2012   *RADIOLOGY REPORT*  Clinical Data:  Fall.  Head injury  CT HEAD WITHOUT CONTRAST CT CERVICAL SPINE WITHOUT CONTRAST  Technique:  Multidetector CT imaging of the head and cervical spine was performed following the standard protocol without intravenous contrast.  Multiplanar CT image reconstructions of the cervical spine were also generated.  Comparison:   None  CT HEAD  Findings: Right frontal laceration and mild scalp hematoma. Negative for fracture.  Moderate atrophy and moderate chronic microvascular ischemic change in the white matter.  No acute infarct.  Negative for hemorrhage or mass.  No shift of the midline structures.  IMPRESSION: Atrophy and chronic microvascular ischemia.  No acute abnormality.  CT CERVICAL SPINE  Findings: Disc degeneration and mild to moderate spondylosis at C3- 4 and C4-5 with associated facet degeneration.  There is advanced disc degeneration and spondylosis at C5-6 contributing to spinal stenosis and right foraminal encroachment.  Mild disc degeneration at C6-7.  Negative for fracture.  Carotid artery  calcification.  IMPRESSION: Cervical spondylosis most severe at C5-6.  Negative for fracture.   Original Report Authenticated By: Janeece Riggers, M.D.   Ct Cervical Spine Wo Contrast  10/21/2012   *RADIOLOGY REPORT*  Clinical Data:  Fall.  Head injury  CT HEAD WITHOUT CONTRAST CT CERVICAL SPINE WITHOUT CONTRAST  Technique:  Multidetector CT imaging of the head and cervical spine was performed following the standard protocol without intravenous contrast.  Multiplanar CT image reconstructions of the cervical spine were also generated.  Comparison:   None  CT HEAD  Findings: Right frontal laceration and mild scalp  hematoma. Negative for fracture.  Moderate atrophy and moderate chronic microvascular ischemic change in the white matter.  No acute infarct.  Negative for hemorrhage or mass.  No shift of the midline structures.  IMPRESSION: Atrophy and chronic microvascular ischemia.  No acute abnormality.  CT CERVICAL SPINE  Findings: Disc degeneration and mild to moderate spondylosis at C3- 4 and C4-5 with associated facet degeneration.  There is advanced disc degeneration and spondylosis at C5-6 contributing to spinal stenosis and right foraminal encroachment.  Mild disc degeneration at C6-7.  Negative for fracture.  Carotid artery calcification.  IMPRESSION: Cervical spondylosis most severe at C5-6.  Negative for fracture.   Original Report Authenticated By: Janeece Riggers, M.D.  I personally reviewed the imaging tests through PACS system I reviewed available ER/hospitalization records through the EMR   1. Head injury, initial encounter   2. Neck pain       MDM  7:39 PM She continues to feel better this time.  CT head and C-spine normal.  Labs without significant abnormality.  Hemoglobin is consistent with his priors.  Discharge him in good condition.  Close PCP followup.  Chest and abdomen are benign.  For range of motion of his hips.  Patient has been ambulatory.   Component     Latest Ref Rng 07/25/2012 07/26/2012 07/27/2012 07/28/2012 07/29/2012              Hemoglobin     13.0 - 17.0 g/dL 16.1 (L) 09.6 (L) 9.5 (L) 9.4 (L) 9.7 (L)   Component     Latest Ref Rng 08/28/2012 10/12/2012 10/12/2012 10/13/2012 10/21/2012         12:38 PM  5:43 PM    Hemoglobin     13.0 - 17.0 g/dL 9.9 (L) 04.5 (L) 40.9 (L) 9.8 (L) 9.3 (L)        Lyanne Co, MD 10/21/12 1943

## 2012-10-22 ENCOUNTER — Telehealth: Payer: Self-pay | Admitting: Family Medicine

## 2012-10-22 MED ORDER — MIRTAZAPINE 15 MG PO TABS: ORAL_TABLET | ORAL | Status: DC

## 2012-10-22 MED ORDER — WARFARIN SODIUM 1 MG PO TABS: 1.0000 mg | ORAL_TABLET | Freq: Every day | ORAL | Status: DC

## 2012-10-22 NOTE — Telephone Encounter (Signed)
Would give 7.5 mg once daily (at bedtime) and NOT bid as this can cause some sedation.

## 2012-10-22 NOTE — Telephone Encounter (Signed)
Received PC from pharmacist reporting pt wife just stopped in and told him they were out of his coumadin, Tamesha and Padonda gone for the long hiliday weekend.  Rx filled for one month.  I called pt home re: the remeron only being available in 15 mg dose and Dr Caryl Never only wants him to take 1/2 tab daily, I had to leave a message for them on home VM

## 2012-10-22 NOTE — Telephone Encounter (Signed)
I called and spoke with the pharmacist, they do not carry the 7.5 mg tab, so he was disp the 15 mg and the sig pharmacy gave pt was 1/2 tab daily.  Pt wife told pharmacy the remeron had been increased and she is giving him 1/2 tab twice daily. Please clarify.

## 2012-10-22 NOTE — Telephone Encounter (Signed)
Pharm called to confirm script instructions: pt says they have been giving mirtazapine (REMERON) 7.5 MG tablet 2 x day,pharm has instructions 1 at bedtime. So now pt has come up VERY short and needs refill.  Pharm states this med is not RX'd in 7.5, and pt is supposed to cut in half. FYI: Pharm also concerned wife is confused about pt's med list and not sure if she is giving any correctly. Feels like someone needs to follow up w/ pt and confirm meds and instructions for meds.

## 2012-10-29 DIAGNOSIS — Z5189 Encounter for other specified aftercare: Secondary | ICD-10-CM

## 2012-10-29 DIAGNOSIS — N390 Urinary tract infection, site not specified: Secondary | ICD-10-CM

## 2012-10-29 DIAGNOSIS — J449 Chronic obstructive pulmonary disease, unspecified: Secondary | ICD-10-CM

## 2012-10-29 DIAGNOSIS — F329 Major depressive disorder, single episode, unspecified: Secondary | ICD-10-CM

## 2012-10-30 ENCOUNTER — Ambulatory Visit (INDEPENDENT_AMBULATORY_CARE_PROVIDER_SITE_OTHER): Payer: Medicare Other | Admitting: Family

## 2012-10-30 ENCOUNTER — Encounter: Payer: Self-pay | Admitting: Family Medicine

## 2012-10-30 ENCOUNTER — Ambulatory Visit (INDEPENDENT_AMBULATORY_CARE_PROVIDER_SITE_OTHER): Payer: Medicare Other | Admitting: Family Medicine

## 2012-10-30 VITALS — BP 120/70 | Temp 97.8°F | Wt 164.0 lb

## 2012-10-30 DIAGNOSIS — N390 Urinary tract infection, site not specified: Secondary | ICD-10-CM

## 2012-10-30 DIAGNOSIS — I4891 Unspecified atrial fibrillation: Secondary | ICD-10-CM

## 2012-10-30 DIAGNOSIS — K921 Melena: Secondary | ICD-10-CM

## 2012-10-30 DIAGNOSIS — R600 Localized edema: Secondary | ICD-10-CM

## 2012-10-30 DIAGNOSIS — R609 Edema, unspecified: Secondary | ICD-10-CM

## 2012-10-30 DIAGNOSIS — R627 Adult failure to thrive: Secondary | ICD-10-CM | POA: Insufficient documentation

## 2012-10-30 LAB — BASIC METABOLIC PANEL
GFR: 38.94 mL/min — ABNORMAL LOW (ref >60.00)
Potassium: 4.6 mEq/L (ref 3.5–5.1)
Sodium: 143 mEq/L (ref 135–145)

## 2012-10-30 LAB — POCT URINALYSIS DIPSTICK
Bilirubin, UA: NEGATIVE
Glucose, UA: NEGATIVE
Nitrite, UA: NEGATIVE

## 2012-10-30 NOTE — Progress Notes (Signed)
Subjective:    Patient ID: [name], male    DOB: Mar 11, 1933, 79 y.o.   MRN: [id number]  HPI Patient seen for hospital followup Very complicated past medical history. He was admitted 10/12/2012 through 10/14/2012 with toxic encephalopathy secondary to UTI Klebsiella pneumonia UTI treated with Levaquin after initial Rocephin. Denies urinary symptoms at this time. He presented with some decreased oral intake and mental status changes. Has had progressive decline over several months. Physical therapy recommended skilled nursing facility and patient declined  Many other chronic medical problems including history of CAD, chronic kidney disease, heart failure, hypertension, atrial fibrillation, hyperlipidemia, COPD, chronic kidney disease, chronic anemia probably of chronic disease, and history of depression. Failure to thrive with several month history of progressive decreased appetite and weight loss. We tried Remeron without much improvement.  Patient had worsening of kidney function recently. Baseline creatinine around 1.2-1.5. Lasix was held recent admission.  Patient did fall on 10/21/2012 after getting up at night to go to the bathroom. No reported loss of consciousness. Patient taken back to ER. CT cervical spine negative for fracture. CT head chronic changes. No acute abnormality. Patient stable since then.  Wife relates very poor appetite. He refuses to drink at times. Recently had couple episodes of bright red blood per rectum. INR today 2.6.  Past Medical History  Diagnosis Date  . History of upper gastrointestinal hemorrhage     dr. Dickie La, GI  . Cerebrovascular accident 55  . Gastric polyp   . Dysphagia, unspecified(787.20)   . Coronary artery disease     a. Nonobstructive by cath 2009.  [name] Permanent atrial fibrillation     INR followed at [facility].  [name] Hypertension   . Esophagitis   . GERD (gastroesophageal reflux disease)   . Erosive gastritis     H/o GIB felt secondary  to this  . Gastric ulcer   . Hiatal hernia   . Internal hemorrhoids   . Hearing impairment   . Vision problems   . Chronic bronchitis     a. h/o resp failure in setting of bronchitis vs PNA 05/2012.  [name] Hyperlipidemia   . ASD (atrial septal defect)     a. s/p repair 1983.  [name] BPH (benign prostatic hyperplasia)   . COPD (chronic obstructive pulmonary disease)   . Anemia   . Depression   . LV dysfunction     a. EF 45-50% by echo 05/2012.  [name] Chronic diarrhea    Past Surgical History  Procedure Laterality Date  . Cholecystectomy  1990s  . Asd repair  [id number]  . Inguinal hernia repair  1963  . Partial small bowel resection  2005    ischemic?  [name] Enteroscopy  05/13/2012    Procedure: ENTEROSCOPY;  Surgeon: Louis Meckel, MD;  Location: [facility];  Service: Endoscopy;  Laterality: N/A;  . Cardioversion      possibly 3 times, dr. brody/cooper    reports that he quit smoking about 29 years ago. His smoking use included Cigarettes. He has a 20 pack-year smoking history. He has never used smokeless tobacco. He reports that he does not drink alcohol or use illicit drugs. family history includes Heart disease in his father and mother and Prostate cancer in his brother. Allergies  Allergen Reactions  . Penicillins     hives      Review of Systems  Constitutional: Positive for appetite change, fatigue and unexpected weight change. Negative for fever and chills.  Respiratory: Negative for cough.  Cardiovascular: Positive for leg swelling. Negative for chest pain and palpitations.  Gastrointestinal: Negative for abdominal pain.  Endocrine: Negative for polyuria.  Genitourinary: Negative for dysuria.  Neurological: Positive for weakness. Negative for syncope and headaches.       Objective:   Physical Exam  Constitutional:  Cachectic somewhat ill-appearing 79 year old male in no acute distress  HENT:  Mouth/Throat: Oropharynx is clear and moist.  Neck: Neck supple.   Cardiovascular: Normal rate and regular rhythm.   Pulmonary/Chest: Effort normal.  Patient has a few faint expiratory wheezes. Slightly diminished breath sounds throughout  Musculoskeletal: He exhibits edema.  1+ pitting edema legs bilaterally.          Assessment & Plan:  #1 recent UTI. Repeat urinalysis. Denies urinary symptoms at this time #2 chronic kidney disease. Recent acute on chronic kidney failure. Lasix held. Recheck basic metabolic panel today. At risk for recurrent dehydration with poor oral intake #3 atrial fibrillation on chronic Coumadin. Increasing risk for falls. May need to reconsider whether he can remain on Coumadin.  He does walk consistently with walker. #4 recent reported bright red blood per rectum. We discussed further evaluation at this point feel he is not capable because of his debilitated nature going through any kind of invasive screening #5 failure to thrive. Long discussion with wife and patient. He is refusing rehabilitation or nursing home care at this time. We discussed other options such as possible hospice and at this point they're not interested #6 bilateral leg edema. Likely multifactorial. Low albumin. Venous stasis. Chronic kidney disease. Recheck basic metabolic panel. If renal function is stable consider starting back furosemide at low dosage

## 2012-10-30 NOTE — Patient Instructions (Signed)
2 tabs on Sunday, Tuesdays, Thursdays, and Saturdays. All other days, 1 tab. Recheck in 3 weeks.   Anticoagulation Dose Instructions as of 10/30/2012     [name] Tue Wed Thu Fri Sat   New Dose 2 mg 1 mg 2 mg 1 mg 2 mg 1 mg 2 mg    Description        2 tabs on Sunday, Tuesdays, Thursdays, and Saturdays. All other days, 1 tab. Recheck in 3 weeks.

## 2012-11-02 ENCOUNTER — Telehealth: Payer: Self-pay | Admitting: Family Medicine

## 2012-11-02 NOTE — Telephone Encounter (Signed)
OK to set up home O2.

## 2012-11-02 NOTE — Telephone Encounter (Signed)
[name] at [facility] requesting orders for O2. Please call.

## 2012-11-03 NOTE — Telephone Encounter (Signed)
Called and spoke to [place] told her okay to set up home Oxygen per Dr.Burchette. Tonya verbalized understanding and asked me to fax order to [facility]. Told her okay. Archie Patten also stated pt is having +3 edema bilateral lower legs wanted to know if pt can take Lasix? Told her I will discuss with Dr.burchette and get back to her. Archie Patten said just let wife know because they have some Lasix already at the home. Told her okay.

## 2012-11-03 NOTE — Telephone Encounter (Signed)
Discuss pt's edema with Dr. Caryl Never and he said for pt to take Lasix 20 mg daily x 3 days.  Called and spoke to pt's wife Lorinda Creed and told her Dr. Caryl Never said for pt to take Lasix 20 mg one tablet by mouth daily x 3 days to help with edema. Genean verbalized understanding. Also told her I faxed order for home oxygen use. Genean verbalized understanding.   Order faxed to [facility] for Oxygen.

## 2012-11-04 ENCOUNTER — Telehealth: Payer: Self-pay | Admitting: Family Medicine

## 2012-11-04 NOTE — Telephone Encounter (Signed)
[facility] states that we faxed an order to resume home O2. However, per Medicare, patient will need to be re-qualified. [facility] needs: OV note that discusses dx & need for home O2, qualifying O2 sat levels (pt's O2 sats need to be 88% or below while resting on RA) - if O2 is above 88%, they will need exertional on both RA and O2. They also need the actual O2 order that gives liter flow, per nasal canula, continuous or while on exertion.  Please send via EPIC. If any issues/questions, please call Melissa.  Pt's next appt is not until 6/13.

## 2012-11-06 NOTE — Telephone Encounter (Signed)
Duplicate, message on Hold in Nancy's basket pt has an appt on 6/13.

## 2012-11-09 NOTE — Telephone Encounter (Signed)
Melissa with [facility] was informed this pt has OV this Friday and we will get her the information she needs after the appt. FYI

## 2012-11-09 NOTE — Telephone Encounter (Signed)
We will discuss at follow-up visit

## 2012-11-13 ENCOUNTER — Ambulatory Visit: Payer: Medicare Other | Admitting: Family Medicine

## 2012-11-14 ENCOUNTER — Other Ambulatory Visit: Payer: Self-pay | Admitting: Family Medicine

## 2012-11-16 ENCOUNTER — Encounter (HOSPITAL_COMMUNITY): Payer: Self-pay | Admitting: *Deleted

## 2012-11-16 ENCOUNTER — Emergency Department (HOSPITAL_COMMUNITY): Payer: Medicare Other

## 2012-11-16 ENCOUNTER — Inpatient Hospital Stay (HOSPITAL_COMMUNITY)
Admission: EM | Admit: 2012-11-16 | Discharge: 2012-11-20 | DRG: 292 | Disposition: A | Discharge status: Skilled Nursing Facility | Payer: Medicare Other | Attending: Internal Medicine | Admitting: Internal Medicine

## 2012-11-16 DIAGNOSIS — I2510 Atherosclerotic heart disease of native coronary artery without angina pectoris: Secondary | ICD-10-CM | POA: Diagnosis present

## 2012-11-16 DIAGNOSIS — Z880 Allergy status to penicillin: Secondary | ICD-10-CM

## 2012-11-16 DIAGNOSIS — I509 Heart failure, unspecified: Secondary | ICD-10-CM

## 2012-11-16 DIAGNOSIS — I10 Essential (primary) hypertension: Secondary | ICD-10-CM | POA: Diagnosis present

## 2012-11-16 DIAGNOSIS — E876 Hypokalemia: Secondary | ICD-10-CM | POA: Diagnosis present

## 2012-11-16 DIAGNOSIS — F329 Major depressive disorder, single episode, unspecified: Secondary | ICD-10-CM | POA: Diagnosis present

## 2012-11-16 DIAGNOSIS — F3289 Other specified depressive episodes: Secondary | ICD-10-CM | POA: Diagnosis present

## 2012-11-16 DIAGNOSIS — I5023 Acute on chronic systolic (congestive) heart failure: Principal | ICD-10-CM

## 2012-11-16 DIAGNOSIS — J4489 Other specified chronic obstructive pulmonary disease: Secondary | ICD-10-CM | POA: Diagnosis present

## 2012-11-16 DIAGNOSIS — I4891 Unspecified atrial fibrillation: Secondary | ICD-10-CM

## 2012-11-16 DIAGNOSIS — I5031 Acute diastolic (congestive) heart failure: Secondary | ICD-10-CM

## 2012-11-16 DIAGNOSIS — I130 Hypertensive heart and chronic kidney disease with heart failure and stage 1 through stage 4 chronic kidney disease, or unspecified chronic kidney disease: Secondary | ICD-10-CM | POA: Diagnosis present

## 2012-11-16 DIAGNOSIS — Z8673 Personal history of transient ischemic attack (TIA), and cerebral infarction without residual deficits: Secondary | ICD-10-CM

## 2012-11-16 DIAGNOSIS — N289 Disorder of kidney and ureter, unspecified: Secondary | ICD-10-CM

## 2012-11-16 DIAGNOSIS — N183 Chronic kidney disease, stage 3 unspecified: Secondary | ICD-10-CM | POA: Diagnosis present

## 2012-11-16 DIAGNOSIS — R1310 Dysphagia, unspecified: Secondary | ICD-10-CM | POA: Diagnosis present

## 2012-11-16 DIAGNOSIS — I4510 Unspecified right bundle-branch block: Secondary | ICD-10-CM | POA: Diagnosis present

## 2012-11-16 DIAGNOSIS — J449 Chronic obstructive pulmonary disease, unspecified: Secondary | ICD-10-CM

## 2012-11-16 DIAGNOSIS — D649 Anemia, unspecified: Secondary | ICD-10-CM

## 2012-11-16 DIAGNOSIS — K219 Gastro-esophageal reflux disease without esophagitis: Secondary | ICD-10-CM | POA: Diagnosis present

## 2012-11-16 DIAGNOSIS — E785 Hyperlipidemia, unspecified: Secondary | ICD-10-CM | POA: Diagnosis present

## 2012-11-16 DIAGNOSIS — R791 Abnormal coagulation profile: Secondary | ICD-10-CM

## 2012-11-16 DIAGNOSIS — J441 Chronic obstructive pulmonary disease with (acute) exacerbation: Secondary | ICD-10-CM | POA: Diagnosis present

## 2012-11-16 DIAGNOSIS — N189 Chronic kidney disease, unspecified: Secondary | ICD-10-CM | POA: Diagnosis present

## 2012-11-16 DIAGNOSIS — J9610 Chronic respiratory failure, unspecified whether with hypoxia or hypercapnia: Secondary | ICD-10-CM

## 2012-11-16 DIAGNOSIS — N400 Enlarged prostate without lower urinary tract symptoms: Secondary | ICD-10-CM | POA: Diagnosis present

## 2012-11-16 DIAGNOSIS — D638 Anemia in other chronic diseases classified elsewhere: Secondary | ICD-10-CM

## 2012-11-16 DIAGNOSIS — N179 Acute kidney failure, unspecified: Secondary | ICD-10-CM

## 2012-11-16 DIAGNOSIS — Z87891 Personal history of nicotine dependence: Secondary | ICD-10-CM

## 2012-11-16 DIAGNOSIS — Z87440 Personal history of urinary (tract) infections: Secondary | ICD-10-CM

## 2012-11-16 DIAGNOSIS — Z66 Do not resuscitate: Secondary | ICD-10-CM | POA: Diagnosis present

## 2012-11-16 LAB — BASIC METABOLIC PANEL
CO2: 31 mEq/L (ref 19–32)
GFR calc non Af Amer: 37 mL/min — ABNORMAL LOW (ref >90)
Glucose, Bld: 98 mg/dL (ref 70–99)
Potassium: 3.7 mEq/L (ref 3.5–5.1)
Sodium: 143 mEq/L (ref 135–145)

## 2012-11-16 LAB — TROPONIN I: Troponin I: <0.3 ng/mL (ref <0.30)

## 2012-11-16 LAB — URINALYSIS W MICROSCOPIC + REFLEX CULTURE
Glucose, UA: NEGATIVE mg/dL
Ketones, ur: NEGATIVE mg/dL
Leukocytes, UA: NEGATIVE
Protein, ur: NEGATIVE mg/dL

## 2012-11-16 LAB — CBC
Hemoglobin: 9.5 g/dL — ABNORMAL LOW (ref 13.0–17.0)
RBC: 3.35 MIL/uL — ABNORMAL LOW (ref 4.22–5.81)

## 2012-11-16 LAB — MRSA PCR SCREENING: MRSA by PCR: POSITIVE — AB

## 2012-11-16 LAB — PRO B NATRIURETIC PEPTIDE: Pro B Natriuretic peptide (BNP): 9840 pg/mL — ABNORMAL HIGH (ref 0–450)

## 2012-11-16 LAB — PROTIME-INR: Prothrombin Time: 37.5 seconds — ABNORMAL HIGH (ref 11.6–15.2)

## 2012-11-16 MED ORDER — FUROSEMIDE 10 MG/ML IJ SOLN
40.0000 mg | Freq: Two times a day (BID) | INTRAMUSCULAR | Status: DC
  Administered 2012-11-16 – 2012-11-19 (×7): 40 mg via INTRAVENOUS
  Filled 2012-11-16 (×9): qty 4

## 2012-11-16 MED ORDER — BUDESONIDE-FORMOTEROL FUMARATE 160-4.5 MCG/ACT IN AERO
2.0000 | INHALATION_SPRAY | Freq: Two times a day (BID) | RESPIRATORY_TRACT | Status: DC
  Administered 2012-11-16 – 2012-11-20 (×9): 2 via RESPIRATORY_TRACT
  Filled 2012-11-16: qty 6

## 2012-11-16 MED ORDER — AMIODARONE HCL 200 MG PO TABS
200.0000 mg | ORAL_TABLET | Freq: Three times a day (TID) | ORAL | Status: DC
  Administered 2012-11-16 – 2012-11-20 (×12): 200 mg via ORAL
  Filled 2012-11-16 (×14): qty 1

## 2012-11-16 MED ORDER — MIRTAZAPINE 7.5 MG PO TABS
7.5000 mg | ORAL_TABLET | Freq: Every day | ORAL | Status: DC
  Administered 2012-11-16 – 2012-11-19 (×4): 7.5 mg via ORAL
  Filled 2012-11-16 (×5): qty 1

## 2012-11-16 MED ORDER — SIMVASTATIN 20 MG PO TABS
20.0000 mg | ORAL_TABLET | Freq: Every day | ORAL | Status: DC
  Administered 2012-11-16 – 2012-11-19 (×4): 20 mg via ORAL
  Filled 2012-11-16 (×5): qty 1

## 2012-11-16 MED ORDER — LEVALBUTEROL HCL 1.25 MG/0.5ML IN NEBU
1.2500 mg | INHALATION_SOLUTION | Freq: Three times a day (TID) | RESPIRATORY_TRACT | Status: DC
  Administered 2012-11-16 – 2012-11-18 (×8): 1.25 mg via RESPIRATORY_TRACT
  Filled 2012-11-16 (×9): qty 0.5

## 2012-11-16 MED ORDER — ONDANSETRON HCL 4 MG/2ML IJ SOLN: 4.0000 mg | Freq: Four times a day (QID) | INTRAMUSCULAR | Status: DC | PRN

## 2012-11-16 MED ORDER — SACCHAROMYCES BOULARDII 250 MG PO CAPS
250.0000 mg | ORAL_CAPSULE | Freq: Two times a day (BID) | ORAL | Status: DC
  Administered 2012-11-16 – 2012-11-20 (×9): 250 mg via ORAL
  Filled 2012-11-16 (×10): qty 1

## 2012-11-16 MED ORDER — FUROSEMIDE 10 MG/ML IJ SOLN
INTRAMUSCULAR | Status: AC
  Filled 2012-11-16: qty 4

## 2012-11-16 MED ORDER — FUROSEMIDE 10 MG/ML IJ SOLN
40.0000 mg | Freq: Once | INTRAMUSCULAR | Status: AC
  Administered 2012-11-16: 40 mg via INTRAVENOUS
  Filled 2012-11-16: qty 4

## 2012-11-16 MED ORDER — WARFARIN - PHARMACIST DOSING INPATIENT: Freq: Every day | Status: DC

## 2012-11-16 MED ORDER — DUTASTERIDE 0.5 MG PO CAPS
0.5000 mg | ORAL_CAPSULE | Freq: Every day | ORAL | Status: DC
  Administered 2012-11-16 – 2012-11-20 (×5): 0.5 mg via ORAL
  Filled 2012-11-16 (×5): qty 1

## 2012-11-16 MED ORDER — ACETAMINOPHEN 325 MG PO TABS
650.0000 mg | ORAL_TABLET | ORAL | Status: DC | PRN
  Administered 2012-11-18: 650 mg via ORAL
  Filled 2012-11-16: qty 2

## 2012-11-16 MED ORDER — IPRATROPIUM BROMIDE 0.02 % IN SOLN
0.5000 mg | Freq: Three times a day (TID) | RESPIRATORY_TRACT | Status: DC
  Administered 2012-11-16 – 2012-11-18 (×8): 0.5 mg via RESPIRATORY_TRACT
  Filled 2012-11-16 (×8): qty 2.5

## 2012-11-16 MED ORDER — METOPROLOL TARTRATE 50 MG PO TABS
50.0000 mg | ORAL_TABLET | Freq: Two times a day (BID) | ORAL | Status: DC
  Administered 2012-11-16 – 2012-11-20 (×9): 50 mg via ORAL
  Filled 2012-11-16 (×10): qty 1

## 2012-11-16 MED ORDER — FERROUS SULFATE 325 (65 FE) MG PO TABS
325.0000 mg | ORAL_TABLET | Freq: Two times a day (BID) | ORAL | Status: DC
  Administered 2012-11-16 – 2012-11-20 (×8): 325 mg via ORAL
  Filled 2012-11-16 (×10): qty 1

## 2012-11-16 MED ORDER — PAROXETINE HCL 10 MG PO TABS
10.0000 mg | ORAL_TABLET | Freq: Every day | ORAL | Status: DC
  Administered 2012-11-16 – 2012-11-20 (×5): 10 mg via ORAL
  Filled 2012-11-16 (×5): qty 1

## 2012-11-16 MED ORDER — SODIUM CHLORIDE 0.9 % IJ SOLN
3.0000 mL | Freq: Two times a day (BID) | INTRAMUSCULAR | Status: DC
  Administered 2012-11-16 – 2012-11-20 (×9): 3 mL via INTRAVENOUS

## 2012-11-16 MED ORDER — PANTOPRAZOLE SODIUM 40 MG PO TBEC
40.0000 mg | DELAYED_RELEASE_TABLET | Freq: Two times a day (BID) | ORAL | Status: DC
  Administered 2012-11-16 – 2012-11-20 (×8): 40 mg via ORAL
  Filled 2012-11-16 (×11): qty 1

## 2012-11-16 MED ORDER — SODIUM CHLORIDE 0.9 % IJ SOLN: 3.0000 mL | INTRAMUSCULAR | Status: DC | PRN

## 2012-11-16 MED ORDER — SODIUM CHLORIDE 0.9 % IV SOLN: 250.0000 mL | INTRAVENOUS | Status: DC | PRN

## 2012-11-16 MED ORDER — CALCIUM CARBONATE ANTACID 500 MG PO CHEW
1.0000 | CHEWABLE_TABLET | Freq: Three times a day (TID) | ORAL | Status: DC
  Administered 2012-11-16 – 2012-11-20 (×12): 200 mg via ORAL
  Filled 2012-11-16 (×14): qty 1

## 2012-11-16 NOTE — ED Notes (Signed)
Bed:RESA<BR> Expected date:<BR> Expected time:<BR> Means of arrival:<BR> Comments:<BR>

## 2012-11-16 NOTE — Progress Notes (Signed)
UR completed 

## 2012-11-16 NOTE — ED Provider Notes (Signed)
History     CSN: [id number]  Arrival date & time 11/16/12  4098   First MD Initiated Contact with Patient 11/16/12 [phone]      Chief Complaint  Patient presents with  . Shortness of Breath    (Consider location/radiation/quality/duration/timing/severity/associated sxs/prior treatment) Patient is a 79 y.o. male presenting with shortness of breath. The history is provided by the EMS personnel. The history is limited by the condition of the patient (Dementia).  Shortness of Breath He was brought in by ambulance. 10 units of been called the home because of a skin tear in his right arm but they noted him in respiratory distress and treated him with CPAP, methylprednisolone, and albuterol. Initial oxygen saturation was reported to of been 89%. Patient does not know why he is here.  Past Medical History  Diagnosis Date  . History of upper gastrointestinal hemorrhage     dr. Dickie La, GI  . Cerebrovascular accident 62  . Gastric polyp   . Dysphagia, unspecified(787.20)   . Coronary artery disease     a. Nonobstructive by cath 2009.  [name] Permanent atrial fibrillation     INR followed at [facility].  [name] Hypertension   . Esophagitis   . GERD (gastroesophageal reflux disease)   . Erosive gastritis     H/o GIB felt secondary to this  . Gastric ulcer   . Hiatal hernia   . Internal hemorrhoids   . Hearing impairment   . Vision problems   . Chronic bronchitis     a. h/o resp failure in setting of bronchitis vs PNA 05/2012.  [name] Hyperlipidemia   . ASD (atrial septal defect)     a. s/p repair 1983.  [name] BPH (benign prostatic hyperplasia)   . COPD (chronic obstructive pulmonary disease)   . Anemia   . Depression   . LV dysfunction     a. EF 45-50% by echo 05/2012.  [name] Chronic diarrhea     Past Surgical History  Procedure Laterality Date  . Cholecystectomy  1990s  . Asd repair  [id number]  . Inguinal hernia repair  1963  . Partial small bowel resection  2005    ischemic?  [name] Enteroscopy  05/13/2012     Procedure: ENTEROSCOPY;  Surgeon: Louis Meckel, MD;  Location: [facility];  Service: Endoscopy;  Laterality: N/A;  . Cardioversion      possibly 3 times, dr. brody/cooper    Family History  Problem Relation Age of Onset  . Heart disease Mother   . Heart disease Father   . Prostate cancer Brother     History  Substance Use Topics  . Smoking status: Former Smoker -- 1.00 packs/day for 20 years    Types: Cigarettes    Quit date: 06/13/1983  . Smokeless tobacco: Never Used  . Alcohol Use: No      Review of Systems  Unable to perform ROS: Dementia  Respiratory: Positive for shortness of breath.   All other systems reviewed and are negative.    Allergies  Penicillins  Home Medications   Current Outpatient Rx  Name  Route  Sig  Dispense  Refill  . dutasteride (AVODART) 0.5 MG capsule   Oral   Take 0.5 mg by mouth daily.         . metoprolol (LOPRESSOR) 50 MG tablet   Oral   Take 50 mg by mouth 2 (two) times daily. 50 mg at lunch and 25 mg at bedtime         .  mirtazapine (REMERON) 7.5 MG tablet   Oral   Take 7.5 mg by mouth at bedtime.         . pantoprazole (PROTONIX) 40 MG tablet   Oral   Take 40 mg by mouth 2 (two) times daily.         [name] PARoxetine (PAXIL) 10 MG tablet   Oral   Take 10 mg by mouth daily.         [name] amiodarone (PACERONE) 200 MG tablet   Oral   Take 1 tablet (200 mg total) by mouth 3 (three) times daily.   90 tablet   1   . budesonide-formoterol (SYMBICORT) 160-4.5 MCG/ACT inhaler   Inhalation   Inhale 2 puffs into the lungs 2 (two) times daily.   1 Inhaler   1   . calcium carbonate (TUMS - DOSED IN MG ELEMENTAL CALCIUM) 500 MG chewable tablet   Oral   Chew 1 tablet by mouth 3 (three) times daily.         . ferrous sulfate 325 (65 FE) MG tablet   Oral   Take 1 tablet (325 mg total) by mouth 2 (two) times daily with a meal.   60 tablet   0   . ipratropium (ATROVENT) 0.02 % nebulizer solution   Nebulization    Take 2.5 mLs (0.5 mg total) by nebulization 4 (four) times daily.   75 mL   2   . levalbuterol (XOPENEX) 0.63 MG/3ML nebulizer solution      1 vial in neb twice daily with Ipratropium.  May take extra neb every 4 hours if needed for wheezing/shortness of breath         . OVER THE COUNTER MEDICATION   Oral   Take 1 tablet by mouth 3 (three) times daily. Over the counter  Calcium medication         . saccharomyces boulardii (FLORASTOR) 250 MG capsule   Oral   Take 1 capsule (250 mg total) by mouth 2 (two) times daily.   60 capsule   2   . simvastatin (ZOCOR) 20 MG tablet   Oral   Take 20 mg by mouth at bedtime.         [name] warfarin (COUMADIN) 1 MG tablet   Oral   Take 1-2 tablets (1-2 mg total) by mouth daily. Takes 1 mg every day except on Tuesday,thursday,saturday. All the other days pt takes 2 mg   45 tablet   0     BP 197/97  Pulse 66  Temp(Src) 97.7 F (36.5 C) (Oral)  Resp 24  SpO2 99%  Physical Exam  Nursing note and vitals reviewed.  79 year old male, resting comfortably and in no acute distress. Vital signs are significant for tachypnea with respiratory rate of 24, and hypertension with blood pressure 197/97. Oxygen saturation is 99%, which is normal. Head is normocephalic and atraumatic. PERRLA, EOMI. Oropharynx is clear. Neck is nontender and supple without adenopathy or JVD. Back is nontender and there is no CVA tenderness. Lungs have diminished breath sounds without rales, wheezes, or rhonchi. Chest is nontender. Heart has regular rate and rhythm without murmur. Abdomen is soft, flat, nontender without masses or hepatosplenomegaly and peristalsis is normoactive. Extremities have 3+ edema and there is 2+ presacral edema, full range of motion is present. There is a skin tear in the right forearm without evidence of infection. Skin is warm and dry without rash. Neurologic: He is awake, alert, oriented to person but not place  or time, cranial nerves are  intact, there are no motor or sensory deficits.  ED Course  Procedures (including critical care time)  Results for orders placed during the hospital encounter of 11/16/12  CBC      Result Value Range   WBC 7.2  4.0 - 10.5 K/uL   RBC 3.35 (*) 4.22 - 5.81 MIL/uL   Hemoglobin 9.5 (*) 13.0 - 17.0 g/dL   HCT 40.9 (*) 81.1 - 91.4 %   MCV 91.9  78.0 - 100.0 fL   MCH 28.4  26.0 - 34.0 pg   MCHC 30.8  30.0 - 36.0 g/dL   RDW 78.2 (*) 95.6 - 21.3 %   Platelets 297  150 - 400 K/uL  BASIC METABOLIC PANEL      Result Value Range   Sodium 143  135 - 145 mEq/L   Potassium 3.7  3.5 - 5.1 mEq/L   Chloride 106  96 - 112 mEq/L   CO2 31  19 - 32 mEq/L   Glucose, Bld 98  70 - 99 mg/dL   BUN 22  6 - 23 mg/dL   Creatinine, Ser 0.86 (*) 0.50 - 1.35 mg/dL   Calcium 8.5  8.4 - 57.8 mg/dL   GFR calc non Af Amer 37 (*) >90 mL/min   GFR calc Af Amer 43 (*) >90 mL/min  PRO B NATRIURETIC PEPTIDE      Result Value Range   Pro B Natriuretic peptide (BNP) 9840.0 (*) 0 - 450 pg/mL  TROPONIN I      Result Value Range   Troponin I <0.30  <0.30 ng/mL  PROTIME-INR      Result Value Range   Prothrombin Time 37.5 (*) 11.6 - 15.2 seconds   INR 4.14 (*) 0.00 - 1.49   Dg Chest Port 1 View  11/16/2012   *RADIOLOGY REPORT*  Clinical Data: Shortness of breath.  PORTABLE CHEST - 1 VIEW  Comparison: Multiple priors, most recently chest x-ray 10/21/2012.  Findings: There is cephalization of the pulmonary vasculature, indistinctness of the interstitial markings, and patchy airspace disease throughout the lungs bilaterally suggestive of moderate pulmonary edema.   Bilateral pleural effusions (moderate on the left and small on the right).  Mild cardiomegaly.  Dilatation of the central pulmonary arteries, compatible with pulmonary arterial hypertension. The patient is rotated to the left on today's exam, resulting in distortion of the mediastinal contours and reduced diagnostic sensitivity and specificity for mediastinal  pathology. Status post median sternotomy.  IMPRESSION: 1.  The appearance of the chest suggests congestive heart failure, as above. 2.  Dilatation of pulmonic arteries suggestive of pulmonary arterial hypertension.   Original Report Authenticated By: Trudie Reed, M.D.      Date: 11/16/2012  Rate: 69  Rhythm: atrial fibrillation  QRS Axis: right  Intervals: normal  ST/T Wave abnormalities: normal  Conduction Disutrbances:right bundle branch block  Narrative Interpretation: Atrial fibrillation with controlled ventricular response, right axis deviation, low voltage. When compared with ECG of 10/12/2012, no significant changes are seen.  Old EKG Reviewed: unchanged    1. CHF exacerbation   2. Anemia   3. Renal insufficiency   4. Elevated INR (international normalized ratio)       MDM  Report of respiratory distress not evident on my exam. He clearly shows signs of fluid overload and dyspnea could be due to CHF or COPD. There is no evidence of bronchospasm at this point. X-ray will be obtained as well as screening labs. Old records are  reviewed and he has several office visits for atrial fibrillation, CHF, COPD.  Chest x-ray is consistent with CHF and BNP is significantly elevated over her baseline. He has chronic anemia and renal insufficiency which are unchanged from baseline. He is given a dose of furosemide and case is been discussed with Dr. Arbutus Leas [facility] who agrees to admit the patient.      Dione Booze, MD 11/16/12 [phone]

## 2012-11-16 NOTE — H&P (Signed)
[facility] History and Physical  [name] UJW:[id number] DOB: 26-Jun-1932 DOA: 11/16/2012   PCP: Kristian Covey, MD   Chief Complaint: Shortness of breath and skin tear  HPI:  79 year old male with a history of systolic CHF (EF of 45-50%), COPD, atrial fibrillation, hyperlipidemia, CAD, presents with one-week history of shortness of breath. The patient's wife actually called the ambulance because of bleeding from a skin tear. When EMS arrived, the patient was noted to be in respiratory distress. The patient was placed on CPAP and brought to the emergency department. Further history from the patient's wife reveals that the patient has been short of breath for one to 2 weeks. She has also noted increasing lower extremity edema. The patient does not weigh himself daily. From his last admission, the patient's furosemide was discontinued at discharge. His primary care provider instructed the patient to take 3 days worth of furosemide approximately one to 2 weeks ago. He has not had any furosemide since that time. He denies any fevers, chills, chest discomfort, nausea, vomiting, diarrhea, abdominal pain, dysuria, hematochezia, melena. The patient is a poor oral intake and progressive weakness. The patient has had a number of admissions most recently from 10/12/2012-10/14/2012 for UTI and acute encephalopathy and from 07/20/2012-07/29/2012 for atrial fibrillation with RVR.  In the emergency department, the patient was given furosemide 40 mg IV x1 with improvement of his respiratory status. He currently has an oxygen saturation of 95-96% on nasal cannula. Chest x-ray reveals pulmonary edema and pleural effusions. EKG showed atrial fibrillation with right bundle branch block. CBC showed hemoglobin 9.5. BMP showed serum creatinine 1.69. ProBNP was 9840. Initial troponin was negative. Assessment/Plan: Acute on chronic systolic CHF -Patient was previously on furosemide 20 mg twice a day on  hold -The patient was told to hold furosemide the time of last discharge due to acute on chronic renal failure -Start furosemide 40 mg IV every 12 hours -Echocardiogram 07/23/2012 showed EF 45-50% with diffuse HK -Check TSH -Urinalysis with reflux to urine culture--> wife states that the patient is not as "sharp" as usual -Exacerbation may be also partly due to the patient's elevated blood pressure Acute on chronic renal failure (CKD III) -Suspect cardiorenal syndrome -Baseline creatinine 1.2-1.5 -Monitor closely on IV furosemide COPD -Mild wheezing on examination -Continue Xopenex and Atrovent nebulizer Atrial fibrillation -Rate controlled -Continue amiodarone -Continue warfarin -INR 4.14 on the day of admission Hypertension -Continue metoprolol tartrate -May need to add hydralazine/long-acting nitrate depending on blood pressure monitoring -Renal insufficiency limits use of ACEi/ARB Deconditioning -Consult social work for skilled nursing facility placement -PT evaluation      Past Medical History  Diagnosis Date  . History of upper gastrointestinal hemorrhage     dr. Dickie La, GI  . Cerebrovascular accident 33  . Gastric polyp   . Dysphagia, unspecified(787.20)   . Coronary artery disease     a. Nonobstructive by cath 2009.  [name] Permanent atrial fibrillation     INR followed at [facility].  [name] Hypertension   . Esophagitis   . GERD (gastroesophageal reflux disease)   . Erosive gastritis     H/o GIB felt secondary to this  . Gastric ulcer   . Hiatal hernia   . Internal hemorrhoids   . Hearing impairment   . Vision problems   . Chronic bronchitis     a. h/o resp failure in setting of bronchitis vs PNA 05/2012.  [name] Hyperlipidemia   . ASD (atrial septal defect)     a. s/p  repair [id number].  [name] BPH (benign prostatic hyperplasia)   . COPD (chronic obstructive pulmonary disease)   . Anemia   . Depression   . LV dysfunction     a. EF 45-50% by echo 05/2012.  [name] Chronic diarrhea     Past Surgical History  Procedure Laterality Date  . Cholecystectomy  1990s  . Asd repair  [id number]  . Inguinal hernia repair  1963  . Partial small bowel resection  2005    ischemic?  [name] Enteroscopy  05/13/2012    Procedure: ENTEROSCOPY;  Surgeon: Louis Meckel, MD;  Location: [facility];  Service: Endoscopy;  Laterality: N/A;  . Cardioversion      possibly 3 times, dr. brody/cooper   Social History:  reports that he quit smoking about 29 years ago. His smoking use included Cigarettes. He has a 20 pack-year smoking history. He has never used smokeless tobacco. He reports that he does not drink alcohol or use illicit drugs.   Family History  Problem Relation Age of Onset  . Heart disease Mother   . Heart disease Father   . Prostate cancer Brother      Allergies  Allergen Reactions  . Penicillins     hives      Prior to Admission medications   Medication Sig Start Date End Date Taking? Authorizing Provider  amiodarone (PACERONE) 200 MG tablet Take 1 tablet (200 mg total) by mouth 3 (three) times daily. 09/04/12  Yes Kristian Covey, MD  budesonide-formoterol (SYMBICORT) 160-4.5 MCG/ACT inhaler Inhale 2 puffs into the lungs 2 (two) times daily. 07/29/12  Yes Renae Fickle, MD  calcium carbonate (TUMS - DOSED IN MG ELEMENTAL CALCIUM) 500 MG chewable tablet Chew 1 tablet by mouth 3 (three) times daily.   Yes Historical Provider, MD  dutasteride (AVODART) 0.5 MG capsule Take 0.5 mg by mouth daily.   Yes Historical Provider, MD  ferrous sulfate 325 (65 FE) MG tablet Take 1 tablet (325 mg total) by mouth 2 (two) times daily with a meal. 05/15/12  Yes Belkys A Regalado, MD  furosemide (LASIX) 20 MG tablet Take 20 mg by mouth 2 (two) times daily.  09/30/12  Yes Historical Provider, MD  ipratropium (ATROVENT) 0.02 % nebulizer solution Take 2.5 mLs (0.5 mg total) by nebulization 4 (four) times daily. 05/15/12  Yes Belkys A Regalado, MD  levalbuterol (XOPENEX) 0.63 MG/3ML nebulizer  solution Take 1 ampule by nebulization 2 (two) times daily as needed for wheezing or shortness of breath. 1 vial in neb twice daily with Ipratropium.  May take extra neb every 4 hours if needed for wheezing/shortness of breath 06/19/12  Yes Nyoka Cowden, MD  metoprolol (LOPRESSOR) 50 MG tablet Take 50 mg by mouth 2 (two) times daily. 50 mg at lunch and 25 mg at bedtime   Yes Historical Provider, MD  mirtazapine (REMERON) 7.5 MG tablet Take 7.5 mg by mouth at bedtime.   Yes Historical Provider, MD  pantoprazole (PROTONIX) 40 MG tablet Take 40 mg by mouth 2 (two) times daily.   Yes Historical Provider, MD  PARoxetine (PAXIL) 10 MG tablet Take 10 mg by mouth daily.   Yes Historical Provider, MD  saccharomyces boulardii (FLORASTOR) 250 MG capsule Take 1 capsule (250 mg total) by mouth 2 (two) times daily. 09/04/12  Yes Kristian Covey, MD  simvastatin (ZOCOR) 20 MG tablet Take 20 mg by mouth at bedtime.   Yes Historical Provider, MD  warfarin (COUMADIN) 1 MG tablet Take 1-2 tablets (1-2  mg total) by mouth daily. Takes 1 mg every day except on Tuesday,thursday,saturday. All the other days pt takes 2 mg 10/22/12  Yes Kristian Covey, MD    Review of Systems:  Constitutional:  No weight loss, night sweats, Fevers, chills, fatigue.  Head&Eyes: No headache.  No vision loss.  No eye pain or scotoma ENT:  No Difficulty swallowing,Tooth/dental problems,Sore throat,  No ear ache, post nasal drip,  Cardio-vascular:  No chest pain, dizziness, palpitations  GI:  No  abdominal pain, nausea, vomiting, diarrhea, loss of appetite, hematochezia, melena, heartburn, indigestion, Resp:  No shortness of breath with exertion or at rest. No cough. No coughing up of blood .No wheezing.No chest wall deformity  Skin:  Skin tears on the forearm GU:  no dysuria, change in color of urine, no urgency or frequency. No flank pain.  Musculoskeletal:  No joint pain or swelling. No decreased range of motion. No back pain.   Psych:  No change in mood or affect.  Neurologic: No headache, no dysesthesia, no focal weakness, no vision loss. No syncope  Physical Exam: Filed Vitals:   11/16/12 0600 11/16/12 0615 11/16/12 0630 11/16/12 0700  BP:   175/81 187/88  Pulse: 64 63 64 63  Temp:      TempSrc:      Resp: 24 21 21 22   SpO2: 96% 95% 94% 95%   General:  A&O x 2, NAD, nontoxic, pleasant/cooperative Head/Eye: No conjunctival hemorrhage, no icterus, Bliss/AT, No nystagmus ENT:  No icterus,  No thrush, edentulous, no pharyngeal exudate Neck:  No masses,  no bruits CV:  RRR, no rub, no gallop, no S3, +JVD Lung:  Bibasilar crackles with diminished breath sounds at the bases. Mild scattered wheezing. Good air movement. No rhonchi. Abdomen: soft/NT, +BS, nondistended, no peritoneal signs Ext: No cyanosis, No rashes, No petechiae, No lymphangitis, 2+ lower extremity edema   Labs on Admission:  Basic Metabolic Panel:  Recent Labs Lab 11/16/12 0604  NA 143  K 3.7  CL 106  CO2 31  GLUCOSE 98  BUN 22  CREATININE 1.69*  CALCIUM 8.5   Liver Function Tests: No results found for this basename: AST, ALT, ALKPHOS, BILITOT, PROT, ALBUMIN,  in the last 168 hours No results found for this basename: LIPASE, AMYLASE,  in the last 168 hours No results found for this basename: AMMONIA,  in the last 168 hours CBC:  Recent Labs Lab 11/16/12 0604  WBC 7.2  HGB 9.5*  HCT 30.8*  MCV 91.9  PLT 297   Cardiac Enzymes:  Recent Labs Lab 11/16/12 0604  TROPONINI <0.30   BNP: No components found with this basename: POCBNP,  CBG: No results found for this basename: GLUCAP,  in the last 168 hours  Radiological Exams on Admission: Dg Chest Port 1 View  11/16/2012   *RADIOLOGY REPORT*  Clinical Data: Shortness of breath.  PORTABLE CHEST - 1 VIEW  Comparison: Multiple priors, most recently chest x-ray 10/21/2012.  Findings: There is cephalization of the pulmonary vasculature, indistinctness of the interstitial  markings, and patchy airspace disease throughout the lungs bilaterally suggestive of moderate pulmonary edema.   Bilateral pleural effusions (moderate on the left and small on the right).  Mild cardiomegaly.  Dilatation of the central pulmonary arteries, compatible with pulmonary arterial hypertension. The patient is rotated to the left on today's exam, resulting in distortion of the mediastinal contours and reduced diagnostic sensitivity and specificity for mediastinal pathology. Status post median sternotomy.  IMPRESSION: 1.  The  appearance of the chest suggests congestive heart failure, as above. 2.  Dilatation of pulmonic arteries suggestive of pulmonary arterial hypertension.   Original Report Authenticated By: Trudie Reed, M.D.    EKG: Independently reviewed. Atrial fibrillation,HR 69, RBBB    Time spent:70 minutes Code Status:   DNR Family Communication:   Wife at bedside   Jamare Vanatta, DO  [facility] Pager [phone]  If 7PM-7AM, please contact night-coverage [email or web address] Password [email or web address] 11/16/2012, 8:00 AM

## 2012-11-16 NOTE — Progress Notes (Signed)
ANTICOAGULATION CONSULT NOTE - Initial Consult  Pharmacy Consult for Coumadin Indication: atrial fibrillation  Allergies  Allergen Reactions  . Penicillins     hives    Patient Measurements: Height: 5\' 11"  (180.3 cm) Weight: 168 lb 1.6 oz (76.25 kg) IBW/kg (Calculated) : 75.3  Vital Signs: Temp: 97.5 F (36.4 C) (06/16 0930) Temp src: Oral (06/16 0930) BP: 182/81 mmHg (06/16 0930) Pulse Rate: 69 (06/16 0930)  Labs:  Recent Labs  11/16/12 0604  HGB 9.5*  HCT 30.8*  PLT 297  LABPROT 37.5*  INR 4.14*  CREATININE 1.69*  TROPONINI <0.30    Estimated Creatinine Clearance: 38.4 ml/min (by C-G formula based on Cr of 1.69).   Medical History: Past Medical History  Diagnosis Date  . History of upper gastrointestinal hemorrhage     dr. Dickie La, GI  . Cerebrovascular accident 88  . Gastric polyp   . Dysphagia, unspecified(787.20)   . Coronary artery disease     a. Nonobstructive by cath 2009.  [name] Permanent atrial fibrillation     INR followed at [facility].  [name] Hypertension   . Esophagitis   . GERD (gastroesophageal reflux disease)   . Erosive gastritis     H/o GIB felt secondary to this  . Gastric ulcer   . Hiatal hernia   . Internal hemorrhoids   . Hearing impairment   . Vision problems   . Chronic bronchitis     a. h/o resp failure in setting of bronchitis vs PNA 05/2012.  [name] Hyperlipidemia   . ASD (atrial septal defect)     a. s/p repair 1983.  [name] BPH (benign prostatic hyperplasia)   . COPD (chronic obstructive pulmonary disease)   . Anemia   . Depression   . LV dysfunction     a. EF 45-50% by echo 05/2012.  [name] Chronic diarrhea     Assessment: 80 yom on Coumadin PTA for h/o afib presented 6/16 with SOB. CXR and BNP c/w CHF exacerbation. INR this AM = 4.14.  Per Coumadin clinic 5/30, patient is currently on Coumadin 1mg  on MWF, 2mg  other days with last dose being 11/15/2012.   Hgb 9.5, plts okay. Amiodarone PTA resumed.  Goal of Therapy:  INR 2-3 Monitor  platelets by anticoagulation protocol: Yes   Plan:   No Coumadin tonight  Daily PT/INR  Pharmacy will f/u  Geoffry Paradise, PharmD, BCPS Pager: [phone] 10:44 AM Pharmacy #: [id number]

## 2012-11-16 NOTE — ED Notes (Signed)
Pt alert, arrives from home, c/o sob, onset this evening, family initially called for skin tear, EMS presents with Cpap in place, tolerating well, initial O2 on scene 89%, Solumedrol 125mg , albuterol inh, sat 99 in ED on cpap, skin pwd

## 2012-11-16 NOTE — ED Notes (Signed)
CPAP discontinued, O2 applied 3L [place], 96% tolerating well

## 2012-11-16 NOTE — ED Notes (Signed)
ED MD bedside, portable xray obtained

## 2012-11-17 DIAGNOSIS — J9610 Chronic respiratory failure, unspecified whether with hypoxia or hypercapnia: Secondary | ICD-10-CM

## 2012-11-17 DIAGNOSIS — I5031 Acute diastolic (congestive) heart failure: Secondary | ICD-10-CM

## 2012-11-17 LAB — PROTIME-INR: INR: 4.47 — ABNORMAL HIGH (ref 0.00–1.49)

## 2012-11-17 LAB — BASIC METABOLIC PANEL
Calcium: 8.2 mg/dL — ABNORMAL LOW (ref 8.4–10.5)
GFR calc Af Amer: 43 mL/min — ABNORMAL LOW (ref >90)
GFR calc non Af Amer: 37 mL/min — ABNORMAL LOW (ref >90)
Potassium: 3.3 mEq/L — ABNORMAL LOW (ref 3.5–5.1)
Sodium: 145 mEq/L (ref 135–145)

## 2012-11-17 MED ORDER — ENSURE COMPLETE PO LIQD
237.0000 mL | ORAL | Status: DC
  Administered 2012-11-18 – 2012-11-20 (×3): 237 mL via ORAL

## 2012-11-17 MED ORDER — ADULT MULTIVITAMIN W/MINERALS CH
1.0000 | ORAL_TABLET | Freq: Every day | ORAL | Status: DC
  Administered 2012-11-17 – 2012-11-20 (×4): 1 via ORAL
  Filled 2012-11-17 (×4): qty 1

## 2012-11-17 MED ORDER — ENSURE PUDDING PO PUDG
1.0000 | ORAL | Status: DC
  Administered 2012-11-17 – 2012-11-19 (×3): 1 via ORAL
  Filled 2012-11-17 (×4): qty 1

## 2012-11-17 NOTE — Evaluation (Signed)
Physical Therapy Evaluation Patient Details Name: [name] MRN: [id number] DOB: 1933-01-21 Today's Date: 11/17/2012 Time: [id number] PT Time Calculation (min): 18 min  PT Assessment / Plan / Recommendation Clinical Impression  Pt presents with acute on chronic CHF with leg swelling and SOB noted on admission.  He has history of COPD, afib and CAD.  Tolerated OOB to chair very well at min/guard assist, however deferred further ambulation due to active bleeding noted in RUE and elevated INR.  Pt will benefit from skilled PT in acute venue to address deficits.  PT recommends [facility] for follow up to assist pt in managing mobility and CHF since wife has recently broken her arm.      PT Assessment  Patient needs continued PT services    Follow Up Recommendations  SNF;Supervision/Assistance - 24 hour    Does the patient have the potential to tolerate intense rehabilitation      Barriers to Discharge Decreased caregiver support      Equipment Recommendations  None recommended by PT    Recommendations for Other Services     Frequency Min 3X/week    Precautions / Restrictions Precautions Precautions: Fall Precaution Comments: Pt with elevated INR Restrictions Weight Bearing Restrictions: No   Pertinent Vitals/Pain No pain      Mobility  Bed Mobility Bed Mobility: Supine to Sit Supine to Sit: 5: Supervision;HOB elevated Details for Bed Mobility Assistance: Pt able to get himself to EOB with supervision for safety.   Transfers Transfers: Sit to Stand;Stand to [employer] Transfers Sit to Stand: 4: Min guard;With upper extremity assist;From bed Stand to Sit: 4: Min guard;With upper extremity assist;With armrests;To chair/3-in-1 Stand Pivot Transfers: 4: Min guard Details for Transfer Assistance: Cues for hand placement and safety when sitting/standing.  Deferred ambulation and only performed stand pivot due to active bleeding noted on RUE.  RN notified.  Cues for  sequencing/technique with RW and keeping RW with him until all the way at chair.  Ambulation/Gait Ambulation/Gait Assistance: Not tested (comment) Stairs: No Wheelchair Mobility Wheelchair Mobility: No    Exercises     PT Diagnosis: Difficulty walking;Generalized weakness  PT Problem List: Decreased strength;Decreased activity tolerance;Decreased balance;Decreased mobility;Decreased coordination;Decreased knowledge of use of DME;Decreased safety awareness;Decreased knowledge of precautions;Cardiopulmonary status limiting activity;Decreased skin integrity PT Treatment Interventions: DME instruction;Gait training;Stair training;Functional mobility training;Therapeutic activities;Therapeutic exercise;Balance training;Patient/family education   PT Goals Acute Rehab PT Goals PT Goal Formulation: With patient Time For Goal Achievement: 11/24/12 Potential to Achieve Goals: Good Pt will go Sit to Stand: with supervision PT Goal: Sit to Stand - Progress: Goal set today Pt will go Stand to Sit: with supervision PT Goal: Stand to Sit - Progress: Goal set today Pt will Ambulate: 51 - 150 feet;with supervision;with least restrictive assistive device PT Goal: Ambulate - Progress: Goal set today Pt will Go Up / Down Stairs: 1-2 stairs;with supervision;with least restrictive assistive device PT Goal: Up/Down Stairs - Progress: Goal set today Pt will Perform Home Exercise Program: with supervision, verbal cues required/provided PT Goal: Perform Home Exercise Program - Progress: Goal set today  Visit Information  Last PT Received On: 11/17/12 Assistance Needed: +1    Subjective Data  Subjective: Considering, I'm doing fine Patient Stated Goal: to return home.    Prior Functioning  Home Living Lives With: Spouse;Daughter (wife broke her arm) Available Help at Discharge: Family;Available PRN/intermittently Type of Home: House Home Access: Stairs to enter Entrance Stairs-Number of Steps:  1 Entrance Stairs-Rails: None Home Layout:  One level Bathroom Shower/Tub:  (sponge bathes) Bathroom Toilet: Standard Home Adaptive Equipment: Bedside commode/3-in-1;Walker - rolling Additional Comments: daughter and son in law were helping, they live next door.  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, [employer] Arousal/Alertness: Awake/alert Behavior During Therapy: WFL for tasks assessed/performed Overall Cognitive Status: Within Functional Limits for tasks assessed (some issues with month and date)    Extremity/Trunk Assessment Right Lower Extremity Assessment RLE ROM/Strength/Tone: Deficits RLE ROM/Strength/Tone Deficits: Note that BLEs are swollen, but strength overall WFL per tasks assessed RLE Sensation: WFL - Light Touch Left Lower Extremity Assessment LLE ROM/Strength/Tone: Deficits LLE ROM/Strength/Tone Deficits: Note that BLEs are swollen, but strength overall WFL per tasks assessed LLE Sensation: WFL - Light Touch Trunk Assessment Trunk Assessment: Kyphotic   Balance Balance Balance Assessed: Yes Static Standing Balance Static Standing - Balance Support: No upper extremity supported Static Standing - Level of Assistance: 4: Min assist Static Standing - Comment/# of Minutes: Pt able to stand at bedside to use urinal, however did note some posterior leaning requiring min assist to correct.   End of Session PT - End of Session Equipment Utilized During Treatment: Gait belt;Oxygen Activity Tolerance: Patient tolerated treatment well Patient left: in chair;with call bell/phone within reach;with chair alarm set Nurse Communication: Mobility status  GP     Vista Deck 11/17/2012, 8:35 AM

## 2012-11-17 NOTE — Progress Notes (Addendum)
INITIAL NUTRITION ASSESSMENT  DOCUMENTATION CODES Per approved criteria  -Not Applicable   INTERVENTION: Provide Ensure Complete once daily Provide Magic Cup once daily Provide Ensure Pudding once daily Provide Multivitamin with minerals daily Encourage PO intake  NUTRITION DIAGNOSIS: Inadequate oral intake related to decreased appetite as evidenced by pt's report and pt at 89% of usual body weight.   Goal: Pt to meet >/= 90% of their estimated nutrition needs  Monitor:  PO intake Weight Labs  Reason for Assessment: Consult  79 y.o. male  Admitting Dx: Shortness of Breath  ASSESSMENT: 79 year old male with a history of systolic CHF (EF of 45-50%), COPD, atrial fibrillation, hyperlipidemia, CAD, presents with one-week history of shortness of breath. Pt reports that he eats 3 meals daily but, he doesn't eat much stating that his appetite "could be better". Pt denies any dysphagia. Pt was eating lunch at time of visit and reports that he ate most of his breakfast today; per nursing notes pt ate 40% of breakfast and 70% of lunch. Pt reports that he was drinking 1 Ensure Complete supplement daily PTA. Discussed protein-rich foods and pt reports that he eats protein rich foods daily.   Height: Ht Readings from Last 1 Encounters:  11/16/12 5\' 11"  (1.803 m)    Weight: Wt Readings from Last 1 Encounters:  11/17/12 161 lb 8 oz (73.256 kg)  Suspect that current weight related to edema; pt weighed 140 lbs on 10/12/12  Ideal Body Weight: 172 lbs  % Ideal Body Weight: 94%  Wt Readings from Last 10 Encounters:  11/17/12 161 lb 8 oz (73.256 kg)  10/30/12 164 lb (74.39 kg)  10/21/12 160 lb (72.576 kg)  10/12/12 140 lb (63.504 kg)  09/30/12 154 lb (69.854 kg)  09/21/12 148 lb 12.8 oz (67.495 kg)  09/11/12 155 lb (70.308 kg)  09/03/12 146 lb 9.6 oz (66.497 kg)  08/28/12 146 lb (66.225 kg)  07/29/12 150 lb 2.1 oz (68.1 kg)    Usual Body Weight: 180 lbs  % Usual Body Weight:  89%  BMI:  Body mass index is 22.53 kg/(m^2).  Estimated Nutritional Needs: Kcal: 1610-9604 Protein: 110-122 grams Fluid: 2.2 L  Skin: +3 RLE and LLE edema; stage 1 pressure ulcer on buttocks, stage 2 pressure ulcer on left leg, skin tear on right arm  Diet Order: Cardiac  EDUCATION NEEDS: -No education needs identified at this time   Intake/Output Summary (Last 24 hours) at 11/17/12 1351 Last data filed at 11/17/12 1300  Gross per 24 hour  Intake    560 ml  Output   2800 ml  Net  -2240 ml    Last BM: 6/16   Labs:   Recent Labs Lab 11/16/12 0604 11/17/12 0504  NA 143 145  K 3.7 3.3*  CL 106 105  CO2 31 34*  BUN 22 19  CREATININE 1.69* 1.68*  CALCIUM 8.5 8.2*  GLUCOSE 98 96    CBG (last 3)  No results found for this basename: GLUCAP,  in the last 72 hours  Scheduled Meds: . amiodarone  200 mg Oral TID  . budesonide-formoterol  2 puff Inhalation BID  . calcium carbonate  1 tablet Oral TID  . dutasteride  0.5 mg Oral Daily  . ferrous sulfate  325 mg Oral BID WC  . furosemide  40 mg Intravenous BID  . ipratropium  0.5 mg Nebulization Q8H  . levalbuterol  1.25 mg Nebulization Q8H  . metoprolol  50 mg Oral BID  . mirtazapine  7.5 mg Oral QHS  . pantoprazole  40 mg Oral BID WC  . PARoxetine  10 mg Oral Daily  . saccharomyces boulardii  250 mg Oral BID  . simvastatin  20 mg Oral QHS  . sodium chloride  3 mL Intravenous Q12H  . Warfarin - Pharmacist Dosing Inpatient   Does not apply q1800    Continuous Infusions:   Past Medical History  Diagnosis Date  . History of upper gastrointestinal hemorrhage     dr. Dickie La, GI  . Cerebrovascular accident 20  . Gastric polyp   . Dysphagia, unspecified(787.20)   . Coronary artery disease     a. Nonobstructive by cath 2009.  [name] Permanent atrial fibrillation     INR followed at [facility].  [name] Hypertension   . Esophagitis   . GERD (gastroesophageal reflux disease)   . Erosive gastritis     H/o GIB felt secondary  to this  . Gastric ulcer   . Hiatal hernia   . Internal hemorrhoids   . Hearing impairment   . Vision problems   . Chronic bronchitis     a. h/o resp failure in setting of bronchitis vs PNA 05/2012.  [name] Hyperlipidemia   . ASD (atrial septal defect)     a. s/p repair 1983.  [name] BPH (benign prostatic hyperplasia)   . COPD (chronic obstructive pulmonary disease)   . Anemia   . Depression   . LV dysfunction     a. EF 45-50% by echo 05/2012.  [name] Chronic diarrhea     Past Surgical History  Procedure Laterality Date  . Cholecystectomy  1990s  . Asd repair  [id number]  . Inguinal hernia repair  1963  . Partial small bowel resection  2005    ischemic?  [name] Enteroscopy  05/13/2012    Procedure: ENTEROSCOPY;  Surgeon: Louis Meckel, MD;  Location: [facility] ENDOSCOPY;  Service: Endoscopy;  Laterality: N/A;  . Cardioversion      possibly 3 times, dr. brody/cooper    Ian Malkin RD, LDN Inpatient Clinical Dietitian Pager: [phone] After Hours Pager: [phone]

## 2012-11-17 NOTE — Progress Notes (Signed)
Clinical Social Work Department BRIEF PSYCHOSOCIAL ASSESSMENT 11/17/2012  Patient:  [name], [name]     Account Number:  [id number]     Admit date:  11/16/2012  Clinical Social Worker:  Hattie Perch  Date/Time:  11/17/2012 12:00 M  Referred by:  Physician  Date Referred:  11/17/2012 Referred for  SNF Placement   Other Referral:   Interview type:  Family Other interview type:    PSYCHOSOCIAL DATA Living Status:  FAMILY Admitted from facility:   Level of care:   Primary support name:  [name] Primary support relationship to patient:  SPOUSE Degree of support available:   fair    CURRENT CONCERNS Current Concerns  Post-Acute Placement   Other Concerns:    SOCIAL WORK ASSESSMENT / PLAN Patient is confused. CSW called patient's spouse. discussed need for snf placement. she states that they have been to [facility] before but that  he wanted to go home very quickly. spouse became tearful and upset stating that she didnt think she could care for him right now and that she was very depressed and sleeps all the time and just wishes someone would come help her and expressed some thoughts that weren't suicide threats but were concerning. CSW asked who her primary care doctor was. she is going to see her doctor today. she contracted for safety with CSW. CSW notified spouse's MD that she is going to come in today and please call CSW if she doesnt. she is agreeable to CSW faxing patient out and is requesting [facility] again.   Assessment/plan status:   Other assessment/ plan:   Information/referral to community resources:    PATIENT'S/FAMILY'S RESPONSE TO PLAN OF CARE: spouse is very tearful and depressed about not being able to take care of patient. agreeable to patient being faxed to [facility].

## 2012-11-17 NOTE — Progress Notes (Signed)
Received referral for [facility] Care Management services from the COPD GOLD program. Screened patient for [facility] Care Management, however, discharge disposition is SNF. Therefore, not appropriate for [facility] Care Management services at this time. Raiford Noble, MSN- Ed, RN,BSN- [facility] [phone]

## 2012-11-17 NOTE — Progress Notes (Signed)
ANTICOAGULATION CONSULT NOTE - Follow Up  Pharmacy Consult for Coumadin Indication: atrial fibrillation  Allergies  Allergen Reactions  . Penicillins     hives   Labs:  Recent Labs  11/16/12 0604 11/17/12 0504  HGB 9.5*  --   HCT 30.8*  --   PLT 297  --   LABPROT 37.5* 39.7*  INR 4.14* 4.47*  CREATININE 1.69* 1.68*  TROPONINI <0.30  --     Assessment: 80 yom on Coumadin PTA for h/o afib presented 6/16 with SOB. CXR and BNP c/w CHF exacerbation. INR 4.14 on admit. Per Coumadin clinic 5/30, patient is currently on Coumadin 1mg  on MWF, 2mg  other days with last dose being 11/15/2012. Amiodarone PTA resumed.  INR trended up to 4.47 today despite no Coumadin since admission. R arm with small wound, bled a little per RN but stopped. No further bleeding reported. CBC stable.  Goal of Therapy:  INR 2-3 Monitor platelets by anticoagulation protocol: Yes   Plan:   Continue to hold Coumadin   Daily PT/INR  Pharmacy will f/u  Geoffry Paradise, PharmD, BCPS Pager: [phone] 10:44 AM Pharmacy #: [id number]

## 2012-11-17 NOTE — Progress Notes (Signed)
[facility] PROGRESS NOTE  Darrell BRIGHAM WUJ:[id number] DOB: Nov 19, 1932 DOA: 11/16/2012 PCP: Kristian Covey, MD  Assessment/Plan: Acute on chronic systolic CHF  -Will continue furosemide 40 mg IV every 12 hours  -Echocardiogram 07/23/2012 showed EF 45-50% with diffuse HK  -TSH within normal limits -Urinalysis with reflux to urine culture--> wife states that the patient is not as "sharp" as usual  -Exacerbation may be also partly due to the patient's elevated blood pressure   Acute on chronic renal failure (CKD III)  -Suspect cardiorenal syndrome  -Baseline creatinine 1.2-1.5  -Monitor closely on IV furosemide   COPD  -Mild wheezing on examination  -Continue Xopenex and Atrovent nebulizer   Atrial fibrillation  -Rate controlled  -Continue amiodarone  -Warfarin per pharmacy.  Agree with continuing to hold coumadin. -INR supratherapeutic at 4.47   Hypertension  -Continue metoprolol tartrate  -May need to add hydralazine/long-acting nitrate depending on blood pressure monitoring  -Renal insufficiency limits use of ACEi/ARB   Deconditioning  -Consult social work for skilled nursing facility placement  -PT evaluation   Code Status: DNR Family Communication: no family at bedside.  Disposition Plan: Pending improvement in respiratory condition to transfer to SNF   Consultants:  none  Procedures:  none  Antibiotics:  None  HPI/Subjective: Patient has no new complaints. No acute issues reported overnight  Objective: Filed Vitals:   11/17/12 0500 11/17/12 0515 11/17/12 1050 11/17/12 1342  BP:  135/64  140/57  Pulse:  58  84  Temp:  97.9 F (36.6 C)  98 F (36.7 C)  TempSrc:  Oral  Oral  Resp:  18  20  Height:      Weight: 73.256 kg (161 lb 8 oz)     SpO2:  98% 98% 100%    Intake/Output Summary (Last 24 hours) at 11/17/12 1544 Last data filed at 11/17/12 1514  Gross per 24 hour  Intake    320 ml  Output   2025 ml  Net  -1705 ml   Filed  Weights   11/16/12 0930 11/17/12 0500  Weight: 76.25 kg (168 lb 1.6 oz) 73.256 kg (161 lb 8 oz)    Exam:   General:  Pt in NAD, Alert and Awake  Cardiovascular: RRR, no MRG  Respiratory: CTA BL, no wheezes  Abdomen: soft, NT, ND  Musculoskeletal: no cyanosis or clubbing   Data Reviewed: Basic Metabolic Panel:  Recent Labs Lab 11/16/12 0604 11/17/12 0504  NA 143 145  K 3.7 3.3*  CL 106 105  CO2 31 34*  GLUCOSE 98 96  BUN 22 19  CREATININE 1.69* 1.68*  CALCIUM 8.5 8.2*   Liver Function Tests: No results found for this basename: AST, ALT, ALKPHOS, BILITOT, PROT, ALBUMIN,  in the last 168 hours No results found for this basename: LIPASE, AMYLASE,  in the last 168 hours No results found for this basename: AMMONIA,  in the last 168 hours CBC:  Recent Labs Lab 11/16/12 0604  WBC 7.2  HGB 9.5*  HCT 30.8*  MCV 91.9  PLT 297   Cardiac Enzymes:  Recent Labs Lab 11/16/12 0604  TROPONINI <0.30   BNP (last 3 results)  Recent Labs  07/23/12 0340 10/13/12 0330 11/16/12 0604  PROBNP 13477.0* 2422.0* 9840.0*   CBG: No results found for this basename: GLUCAP,  in the last 168 hours  Recent Results (from the past 240 hour(s))  MRSA PCR SCREENING     Status: Abnormal   Collection Time    11/16/12 12:47 PM  Result Value Range Status   MRSA by PCR POSITIVE (*) NEGATIVE Final   Comment:            The GeneXpert MRSA Assay (FDA     approved for NASAL specimens     only), is one component of a     comprehensive MRSA colonization     surveillance program. It is not     intended to diagnose MRSA     infection nor to guide or     monitor treatment for     MRSA infections.     RESULT CALLED TO, READ BACK BY AND VERIFIED WITH:     DEUTSCH,J. AT 1407 ON 06.16.14 BY LOVE,T.     Studies: Dg Chest Port 1 View  11/16/2012   *RADIOLOGY REPORT*  Clinical Data: Shortness of breath.  PORTABLE CHEST - 1 VIEW  Comparison: Multiple priors, most recently chest x-ray  10/21/2012.  Findings: There is cephalization of the pulmonary vasculature, indistinctness of the interstitial markings, and patchy airspace disease throughout the lungs bilaterally suggestive of moderate pulmonary edema.   Bilateral pleural effusions (moderate on the left and small on the right).  Mild cardiomegaly.  Dilatation of the central pulmonary arteries, compatible with pulmonary arterial hypertension. The patient is rotated to the left on today's exam, resulting in distortion of the mediastinal contours and reduced diagnostic sensitivity and specificity for mediastinal pathology. Status post median sternotomy.  IMPRESSION: 1.  The appearance of the chest suggests congestive heart failure, as above. 2.  Dilatation of pulmonic arteries suggestive of pulmonary arterial hypertension.   Original Report Authenticated By: Trudie Reed, M.D.    Scheduled Meds: . amiodarone  200 mg Oral TID  . budesonide-formoterol  2 puff Inhalation BID  . calcium carbonate  1 tablet Oral TID  . dutasteride  0.5 mg Oral Daily  . feeding supplement  237 mL Oral Q24H  . feeding supplement  1 Container Oral Q24H  . ferrous sulfate  325 mg Oral BID WC  . furosemide  40 mg Intravenous BID  . ipratropium  0.5 mg Nebulization Q8H  . levalbuterol  1.25 mg Nebulization Q8H  . metoprolol  50 mg Oral BID  . mirtazapine  7.5 mg Oral QHS  . multivitamin with minerals  1 tablet Oral Daily  . pantoprazole  40 mg Oral BID WC  . PARoxetine  10 mg Oral Daily  . saccharomyces boulardii  250 mg Oral BID  . simvastatin  20 mg Oral QHS  . sodium chloride  3 mL Intravenous Q12H  . Warfarin - Pharmacist Dosing Inpatient   Does not apply q1800   Continuous Infusions:   Active Problems:   CORONARY ARTERY DISEASE   FIBRILLATION, ATRIAL   Atrial fibrillation with bradycardia   HTN (hypertension)   COPD (chronic obstructive pulmonary disease)   Depression   Acute on chronic systolic CHF (congestive heart failure)   Acute  on chronic renal failure    Time spent: > 35 minutes    Penny Pia  [facility] Pager [phone] If 7PM-7AM, please contact night-coverage at [email or web address], password [facility] 11/17/2012, 3:44 PM  LOS: 1 day

## 2012-11-17 NOTE — Evaluation (Signed)
Occupational Therapy Evaluation Patient Details Name: [name] MRN: [id number] DOB: 26-Aug-1932 Today's Date: 11/17/2012 Time: 4098-1191 OT Time Calculation (min): 18 min  OT Assessment / Plan / Recommendation Clinical Impression  This 79 year old man was admitted with CHF.  He requires min guard Assist for adls/transfers.  At baseline, he was mod I.  He will benefit from skilled OT to increase safety and independence with adls withi supervision level goals in acute.      OT Assessment  Patient needs continued OT Services    Follow Up Recommendations  SNF;Supervision/Assistance - 24 hour    Barriers to Discharge   pt states wife is in a cast, with a broken arm; son in law/daughter live next door and have been helping  Equipment Recommendations  None recommended by OT    Recommendations for Other Services    Frequency  Min 2X/week    Precautions / Restrictions Precautions Precautions: Fall Precaution Comments: Pt with elevated INR Restrictions Weight Bearing Restrictions: No   Pertinent Vitals/Pain No pain reported.  Pt on 2 liters 02    ADL  Grooming: Wash/dry face;Set up Where Assessed - Grooming: Unsupported sitting Upper Body Bathing: Set up Where Assessed - Upper Body Bathing: Unsupported sitting Lower Body Bathing: Minimal assistance Where Assessed - Lower Body Bathing: Supported sit to stand Upper Body Dressing: Set up Where Assessed - Upper Body Dressing: Unsupported sitting Lower Body Dressing: Minimal assistance Where Assessed - Lower Body Dressing: Supported sit to stand Toilet Transfer: Simulated;Min guard Statistician Method: Surveyor, minerals:  (bed to chair) Toileting - Architect and Hygiene: Minimal assistance Where Assessed - Engineer, mining and Hygiene: Sit to stand from 3-in-1 or toilet Equipment Used: Rolling walker;Gait belt Transfers/Ambulation Related to ADLs: Only performed SPT due to IR  4.47 and active bleeding at L elbow area:  RN alerted.  Put pillows under both arms in chair ADL Comments: cues for safety with RW during transfer.  Pt able to don socks withset up:  bil LE edema present (CHF) Min A for balance with adls, when both hands not on walker   OT Diagnosis: Generalized weakness  OT Problem List: Decreased strength;Impaired balance (sitting and/or standing);Decreased safety awareness OT Treatment Interventions: Self-care/ADL training;DME and/or AE instruction;Patient/family education;Balance training;Energy conservation   OT Goals Acute Rehab OT Goals OT Goal Formulation: With patient Time For Goal Achievement: 12/01/12 Potential to Achieve Goals: Good ADL Goals Pt Will Transfer to Toilet: with supervision;Ambulation;3-in-1 (and complete all aspects of toileting) Miscellaneous OT Goals Miscellaneous OT Goal #1: Pt will gather adl supplies with rw at supervision level and complete adl OT Goal: Miscellaneous Goal #1 - Progress: Goal set today Miscellaneous OT Goal #2: Pt will initiate rest breaks prn for energy conservation OT Goal: Miscellaneous Goal #2 - Progress: Goal set today  Visit Information  Last OT Received On: 11/17/12 Assistance Needed: +1 PT/OT Co-Evaluation/Treatment: Yes    Subjective Data  Subjective: My wife broke her arm Patient Stated Goal: none stated.  Agreeable to OT/PT   Prior Functioning     Home Living Lives With: Spouse;Daughter (wife broke her arm) Available Help at Discharge: Family;Available PRN/intermittently Type of Home: House Home Access: Stairs to enter [employer] of Steps: 1 Entrance Stairs-Rails: None Home Layout: One level Bathroom Shower/Tub:  (sponge bathes) Bathroom Toilet: Standard Home Adaptive Equipment: Bedside commode/3-in-1;Walker - rolling Additional Comments: daughter and son in law were helping, they live next door.  Prior Function Level of Independence:  Independent with assistive  device(s) Able to Take Stairs?: Yes Driving: No Vocation: Retired Musician: H[employer]         Vision/Perception     Copywriter, advertising Arousal/Alertness: Awake/alert Behavior During Therapy: WFL for tasks assessed/performed Overall Cognitive Status: Within Functional Limits for tasks assessed    Extremity/Trunk Assessment Right Upper Extremity Assessment RUE ROM/Strength/Tone: WFL for tasks assessed Left Upper Extremity Assessment LUE ROM/Strength/Tone: WFL for tasks assessed Right Lower Extremity Assessment RLE ROM/Strength/Tone: Deficits RLE ROM/Strength/Tone Deficits: Note that BLEs are swollen, but strength overall WFL per tasks assessed RLE Sensation: WFL - Light Touch Left Lower Extremity Assessment LLE ROM/Strength/Tone: Deficits LLE ROM/Strength/Tone Deficits: Note that BLEs are swollen, but strength overall WFL per tasks assessed LLE Sensation: WFL - Light Touch Trunk Assessment Trunk Assessment: Kyphotic     Mobility Bed Mobility Bed Mobility: Supine to Sit Supine to Sit: 5: Supervision;HOB elevated Details for Bed Mobility Assistance: Pt able to get himself to EOB with supervision for safety.   Transfers Sit to Stand: 4: Min guard;With upper extremity assist;From bed Stand to Sit: 4: Min guard;With upper extremity assist;With armrests;To chair/3-in-1 Details for Transfer Assistance: Cues for hand placement and safety when sitting/standing.  Deferred ambulation and only performed stand pivot due to active bleeding noted on RUE.  RN notified.  Cues for sequencing/technique with RW and keeping RW with him until all the way at chair.      Exercise     Balance Balance Balance Assessed: Yes Static Standing Balance Static Standing - Balance Support: No upper extremity supported Static Standing - Level of Assistance: 4: Min assist Static Standing - Comment/# of Minutes: Pt able to stand at bedside to use urinal, however did note some posterior  leaning requiring min assist to correct.    End of Session OT - End of Session Activity Tolerance: Patient tolerated treatment well Patient left: in chair;with call bell/phone within reach Nurse Communication:  (bleeding R elbow area)  GO     Adin Lariccia 11/17/2012, 8:46 AM Marica Otter, OTR/L [phone] 11/17/2012

## 2012-11-18 LAB — PROTIME-INR
INR: 3.02 — ABNORMAL HIGH (ref 0.00–1.49)
Prothrombin Time: 29.7 seconds — ABNORMAL HIGH (ref 11.6–15.2)

## 2012-11-18 LAB — BASIC METABOLIC PANEL
Calcium: 8.3 mg/dL — ABNORMAL LOW (ref 8.4–10.5)
Creatinine, Ser: 1.58 mg/dL — ABNORMAL HIGH (ref 0.50–1.35)
GFR calc non Af Amer: 40 mL/min — ABNORMAL LOW (ref >90)
Sodium: 145 mEq/L (ref 135–145)

## 2012-11-18 MED ORDER — IPRATROPIUM BROMIDE 0.02 % IN SOLN
0.5000 mg | Freq: Four times a day (QID) | RESPIRATORY_TRACT | Status: DC
  Administered 2012-11-19 – 2012-11-20 (×4): 0.5 mg via RESPIRATORY_TRACT
  Filled 2012-11-18 (×5): qty 2.5

## 2012-11-18 MED ORDER — LEVALBUTEROL HCL 1.25 MG/0.5ML IN NEBU
1.2500 mg | INHALATION_SOLUTION | Freq: Two times a day (BID) | RESPIRATORY_TRACT | Status: DC
  Administered 2012-11-19 – 2012-11-20 (×3): 1.25 mg via RESPIRATORY_TRACT
  Filled 2012-11-18 (×5): qty 0.5

## 2012-11-18 MED ORDER — CHLORHEXIDINE GLUCONATE CLOTH 2 % EX PADS
6.0000 | MEDICATED_PAD | Freq: Every day | CUTANEOUS | Status: DC
  Administered 2012-11-18 – 2012-11-20 (×3): 6 via TOPICAL

## 2012-11-18 MED ORDER — LEVALBUTEROL HCL 1.25 MG/0.5ML IN NEBU
1.2500 mg | INHALATION_SOLUTION | Freq: Four times a day (QID) | RESPIRATORY_TRACT | Status: DC | PRN
  Filled 2012-11-18: qty 0.5

## 2012-11-18 MED ORDER — POTASSIUM CHLORIDE CRYS ER 20 MEQ PO TBCR
40.0000 meq | EXTENDED_RELEASE_TABLET | Freq: Two times a day (BID) | ORAL | Status: DC
  Administered 2012-11-18 (×2): 40 meq via ORAL
  Filled 2012-11-18 (×4): qty 2

## 2012-11-18 MED ORDER — MUPIROCIN 2 % EX OINT
1.0000 "application " | TOPICAL_OINTMENT | Freq: Two times a day (BID) | CUTANEOUS | Status: DC
  Administered 2012-11-18 – 2012-11-20 (×6): 1 via NASAL
  Filled 2012-11-18: qty 22

## 2012-11-18 MED ORDER — WARFARIN 0.5 MG HALF TABLET
0.5000 mg | ORAL_TABLET | Freq: Once | ORAL | Status: AC
  Administered 2012-11-18: 0.5 mg via ORAL
  Filled 2012-11-18: qty 1

## 2012-11-18 NOTE — Progress Notes (Signed)
ANTICOAGULATION CONSULT NOTE - Follow Up  Pharmacy Consult for Coumadin Indication: atrial fibrillation  Allergies  Allergen Reactions  . Penicillins     hives   Labs:  Recent Labs  11/16/12 0604 11/17/12 0504 11/18/12 0510  HGB 9.5*  --   --   HCT 30.8*  --   --   PLT 297  --   --   LABPROT 37.5* 39.7* 29.7*  INR 4.14* 4.47* 3.02*  CREATININE 1.69* 1.68* 1.58*  TROPONINI <0.30  --   --     Assessment: 80 yom on Coumadin PTA for h/o afib presented 6/16 with SOB. CXR and BNP c/w CHF exacerbation. INR 4.14 on admit. Per Coumadin clinic 5/30, patient is currently on Coumadin 1mg  on MWF, 2mg  other days with last dose being 11/15/2012. Amiodarone PTA resumed.  INR trended up to peak of 4.47 on 6/17 and down to 3.02 today. Hgb 9.5 on admit, patient's skin tear to right arm was bleeding through dressing at bedtime per RN documentation.   Goal of Therapy:  INR 2-3 Monitor platelets by anticoagulation protocol: Yes   Plan:   Coumadin 0.5 mg tonight to prevent further significant drop in INR  Daily PT/INR  Pharmacy will f/u  Geoffry Paradise, PharmD, BCPS Pager: [phone] 10:43 AM Pharmacy #: [id number]

## 2012-11-18 NOTE — Progress Notes (Signed)
ANTICOAGULATION CONSULT NOTE - Follow Up  Pharmacy Consult for Coumadin Indication: atrial fibrillation  Allergies  Allergen Reactions  . Penicillins     hives   Labs:  Recent Labs  11/16/12 0604 11/17/12 0504 11/18/12 0510  HGB 9.5*  --   --   HCT 30.8*  --   --   PLT 297  --   --   LABPROT 37.5* 39.7* 29.7*  INR 4.14* 4.47* 3.02*  CREATININE 1.69* 1.68* 1.58*  TROPONINI <0.30  --   --     Assessment: 80 yom on Coumadin PTA for h/o afib presented 6/16 with SOB. CXR and BNP c/w CHF exacerbation. INR 4.14 on admit. Per Coumadin clinic 5/30, patient is currently on Coumadin 1mg  on MWF, 2mg  other days with last dose being 11/15/2012. Amiodarone PTA resumed.  INR trended up to peak of 4.47 on 6/17 and down to 3.02 today.  NO bleeding, Hgb 9.5 on admit.   Goal of Therapy:  INR 2-3 Monitor platelets by anticoagulation protocol: Yes   Plan:   Coumadin 0.5 mg tonight to prevent further significant drop in INR  Daily PT/INR  Pharmacy will f/u  Geoffry Paradise, PharmD, BCPS Pager: [phone] 10:39 AM Pharmacy #: [id number]

## 2012-11-18 NOTE — Progress Notes (Signed)
Patient's skin tear to right arm was bleeding through dressing at bedtime.  Removed previous dressing of Kerlix and cleansed site, which had two open sites that were bleeding, with saline.  Placed Vaseline gauze, 4x4's, and Kerlix.  Will continue to monitor patient.

## 2012-11-18 NOTE — Progress Notes (Addendum)
[facility] PROGRESS NOTE  [name] ZOX:[id number] DOB: 1933-06-02 DOA: 11/16/2012 PCP: Kristian Covey, MD  Assessment/Plan: Acute on chronic systolic CHF  -Continue furosemide 40 mg IV every 12 hours  -Echocardiogram 07/23/2012 showed EF 45-50% with diffuse HK  -negative 4.4L -TSH within normal limits  Acute on chronic renal failure (CKD III)  -improving -Suspect cardiorenal syndrome  -Baseline creatinine 1.2-1.5  -Monitor closely on IV furosemide   COPD  -stable -Continue Xopenex and Atrovent nebulizer   Atrial fibrillation  -Rate controlled  -Continue amiodarone  -Warfarin per pharmacy.  -INR better 3.0 today  Hypertension  -Continue metoprolol tartrate  -May need to add hydralazine/long-acting nitrate depending on blood pressure monitoring  -Renal insufficiency limits use of ACEi/ARB   Hypokalemia: -replace  Deconditioning  -social work following for SNF placement  -PT evaluation noted   Code Status: DNR Family Communication: no family at bedside.  Disposition Plan: SNF when stable   Consultants:  none  Procedures:  none  Antibiotics:  None  HPI/Subjective: Patient has no new complaints. No acute issues reported overnight, breathing starting to improve  Objective: Filed Vitals:   11/17/12 2211 11/18/12 0632 11/18/12 0953 11/18/12 1426  BP: 142/72 151/77 150/63   Pulse: 70 66 65   Temp: 98 F (36.7 C) 98.3 F (36.8 C)    TempSrc: Oral Oral    Resp: 16 16    Height:      Weight:  73.4 kg (161 lb 13.1 oz)    SpO2: 97% 100%  93%    Intake/Output Summary (Last 24 hours) at 11/18/12 1441 Last data filed at 11/18/12 1121  Gross per 24 hour  Intake    556 ml  Output   2745 ml  Net  -2189 ml   Filed Weights   11/16/12 0930 11/17/12 0500 11/18/12 0632  Weight: 76.25 kg (168 lb 1.6 oz) 73.256 kg (161 lb 8 oz) 73.4 kg (161 lb 13.1 oz)    Exam:   General:  Pt in NAD, Alert and Awake  Cardiovascular: RRR, no  MRG  Respiratory: decreased BS at bases  Abdomen: soft, NT, ND  Musculoskeletal: no cyanosis or clubbing   Data Reviewed: Basic Metabolic Panel:  Recent Labs Lab 11/16/12 0604 11/17/12 0504 11/18/12 0510  NA 143 145 145  K 3.7 3.3* 3.2*  CL 106 105 102  CO2 31 34* 37*  GLUCOSE 98 96 85  BUN 22 19 17   CREATININE 1.69* 1.68* 1.58*  CALCIUM 8.5 8.2* 8.3*   Liver Function Tests: No results found for this basename: AST, ALT, ALKPHOS, BILITOT, PROT, ALBUMIN,  in the last 168 hours No results found for this basename: LIPASE, AMYLASE,  in the last 168 hours No results found for this basename: AMMONIA,  in the last 168 hours CBC:  Recent Labs Lab 11/16/12 0604  WBC 7.2  HGB 9.5*  HCT 30.8*  MCV 91.9  PLT 297   Cardiac Enzymes:  Recent Labs Lab 11/16/12 0604  TROPONINI <0.30   BNP (last 3 results)  Recent Labs  07/23/12 0340 10/13/12 0330 11/16/12 0604  PROBNP 13477.0* 2422.0* 9840.0*   CBG: No results found for this basename: GLUCAP,  in the last 168 hours  Recent Results (from the past 240 hour(s))  MRSA PCR SCREENING     Status: Abnormal   Collection Time    11/16/12 12:47 PM      Result Value Range Status   MRSA by PCR POSITIVE (*) NEGATIVE Final   Comment:  The GeneXpert MRSA Assay (FDA     approved for NASAL specimens     only), is one component of a     comprehensive MRSA colonization     surveillance program. It is not     intended to diagnose MRSA     infection nor to guide or     monitor treatment for     MRSA infections.     RESULT CALLED TO, READ BACK BY AND VERIFIED WITH:     DEUTSCH,J. AT 1407 ON 06.16.14 BY LOVE,T.     Studies: No results found.  Scheduled Meds: . amiodarone  200 mg Oral TID  . budesonide-formoterol  2 puff Inhalation BID  . calcium carbonate  1 tablet Oral TID  . Chlorhexidine Gluconate Cloth  6 each Topical Q0600  . dutasteride  0.5 mg Oral Daily  . feeding supplement  237 mL Oral Q24H  .  feeding supplement  1 Container Oral Q24H  . ferrous sulfate  325 mg Oral BID WC  . furosemide  40 mg Intravenous BID  . ipratropium  0.5 mg Nebulization Q8H  . levalbuterol  1.25 mg Nebulization Q8H  . metoprolol  50 mg Oral BID  . mirtazapine  7.5 mg Oral QHS  . multivitamin with minerals  1 tablet Oral Daily  . mupirocin ointment  1 application Nasal BID  . pantoprazole  40 mg Oral BID WC  . PARoxetine  10 mg Oral Daily  . potassium chloride  40 mEq Oral BID  . saccharomyces boulardii  250 mg Oral BID  . simvastatin  20 mg Oral QHS  . sodium chloride  3 mL Intravenous Q12H  . warfarin  0.5 mg Oral ONCE-1800  . Warfarin - Pharmacist Dosing Inpatient   Does not apply q1800   Continuous Infusions:   Active Problems:   CORONARY ARTERY DISEASE   FIBRILLATION, ATRIAL   Atrial fibrillation with bradycardia   HTN (hypertension)   COPD (chronic obstructive pulmonary disease)   Depression   Acute on chronic systolic CHF (congestive heart failure)   Acute on chronic renal failure    Time spent: > 35 minutes    [name]  [facility] Pager [phone] If 7PM-7AM, please contact night-coverage at [email or web address], password [facility] 11/18/2012, 2:41 PM  LOS: 2 days

## 2012-11-18 NOTE — Progress Notes (Signed)
Physical Therapy Treatment Patient Details Name: [name] MRN: [id number] DOB: 08-18-1932 Today's Date: 11/18/2012 Time: 4098-1191 PT Time Calculation (min): 19 min  PT Assessment / Plan / Recommendation Comments on Treatment Session  Pt able to ambulate in hallway today, however distance was limited due to fatigue.  SaO2 at 93% following amb on 3L O2.  RN notified.     Follow Up Recommendations  SNF;Supervision/Assistance - 24 hour     Does the patient have the potential to tolerate intense rehabilitation     Barriers to Discharge        Equipment Recommendations  None recommended by PT    Recommendations for Other Services    Frequency Min 3X/week   Plan Discharge plan remains appropriate    Precautions / Restrictions Precautions Precautions: Fall Precaution Comments: elevated INR and monitor O2 sats.  Restrictions Weight Bearing Restrictions: No   Pertinent Vitals/Pain No stated pain during session    Mobility  Bed Mobility Bed Mobility: Supine to Sit Supine to Sit: 6: Modified independent (Device/Increase time);HOB elevated Details for Bed Mobility Assistance: Increased time required to get to EOB.  Transfers Transfers: Sit to Stand;Stand to [employer] Transfers Sit to Stand: 4: Min assist;With upper extremity assist;From bed Stand to Sit: 4: Min guard;With upper extremity assist;With armrests;To chair/3-in-1 Details for Transfer Assistance: Performed x 2 with cues for hand placement and safety.  Pt did somewhat better today with keeping RW with him until at seating surface.  Ambulation/Gait Ambulation/Gait Assistance: 3: Mod assist Ambulation Distance (Feet): 65 Feet Assistive device: Rolling walker Ambulation/Gait Assistance Details: Cues for upright posture, continued pursed lip breathing and maintaining position inside of RW.  Pt with slight tendency to veer to the right when ambulating, but able to correct with cues.  Gait Pattern: Step-through  pattern;Decreased stride length;Trunk flexed Gait velocity: decreased General Gait Details: Ambulated on 3L O2 with SaO2 at 90% prior to ambulation, therefore maintained 3L throughout amb with SaO2 at 93% once seated in chair following amb.  Stairs: No    Exercises     PT Diagnosis:    PT Problem List:   PT Treatment Interventions:     PT Goals Acute Rehab PT Goals PT Goal Formulation: With patient Time For Goal Achievement: 11/24/12 Potential to Achieve Goals: Good Pt will go Sit to Stand: with supervision PT Goal: Sit to Stand - Progress: Progressing toward goal Pt will go Stand to Sit: with supervision PT Goal: Stand to Sit - Progress: Progressing toward goal Pt will Ambulate: 51 - 150 feet;with supervision;with least restrictive assistive device PT Goal: Ambulate - Progress: Progressing toward goal  Visit Information  Last PT Received On: 11/18/12 Assistance Needed: +1    Subjective Data  Subjective: I need to use the bathroom Patient Stated Goal: to return home.    Cognition  Cognition Arousal/Alertness: Awake/alert Behavior During Therapy: WFL for tasks assessed/performed Overall Cognitive Status: Within Functional Limits for tasks assessed    Balance     End of Session PT - End of Session Equipment Utilized During Treatment: Gait belt;Oxygen Activity Tolerance: Patient limited by fatigue Patient left: in chair;with call bell/phone within reach;with chair alarm set Nurse Communication: Mobility status   GP     Vista Deck 11/18/2012, 8:38 AM

## 2012-11-19 DIAGNOSIS — D638 Anemia in other chronic diseases classified elsewhere: Secondary | ICD-10-CM

## 2012-11-19 LAB — BASIC METABOLIC PANEL
BUN: 20 mg/dL (ref 6–23)
BUN: 22 mg/dL (ref 6–23)
CO2: 39 mEq/L — ABNORMAL HIGH (ref 19–32)
Calcium: 8.3 mg/dL — ABNORMAL LOW (ref 8.4–10.5)
Chloride: 100 mEq/L (ref 96–112)
Chloride: 97 mEq/L (ref 96–112)
Creatinine, Ser: 1.92 mg/dL — ABNORMAL HIGH (ref 0.50–1.35)
Creatinine, Ser: 2.05 mg/dL — ABNORMAL HIGH (ref 0.50–1.35)
GFR calc Af Amer: 37 mL/min — ABNORMAL LOW (ref >90)
GFR calc Af Amer: 34 mL/min — ABNORMAL LOW (ref >90)
Glucose, Bld: 135 mg/dL — ABNORMAL HIGH (ref 70–99)
Potassium: 4.4 mEq/L (ref 3.5–5.1)

## 2012-11-19 LAB — PROTIME-INR: Prothrombin Time: 21.9 seconds — ABNORMAL HIGH (ref 11.6–15.2)

## 2012-11-19 MED ORDER — POTASSIUM CHLORIDE CRYS ER 20 MEQ PO TBCR
40.0000 meq | EXTENDED_RELEASE_TABLET | Freq: Every day | ORAL | Status: DC
  Administered 2012-11-19 – 2012-11-20 (×2): 40 meq via ORAL
  Filled 2012-11-19 (×2): qty 2

## 2012-11-19 MED ORDER — FUROSEMIDE 40 MG PO TABS
40.0000 mg | ORAL_TABLET | Freq: Two times a day (BID) | ORAL | Status: DC
  Administered 2012-11-19 – 2012-11-20 (×2): 40 mg via ORAL
  Filled 2012-11-19 (×4): qty 1

## 2012-11-19 MED ORDER — WARFARIN SODIUM 2 MG PO TABS
2.0000 mg | ORAL_TABLET | Freq: Once | ORAL | Status: AC
  Administered 2012-11-19: 2 mg via ORAL
  Filled 2012-11-19: qty 1

## 2012-11-19 NOTE — Progress Notes (Signed)
ANTICOAGULATION CONSULT NOTE - Follow Up  Pharmacy Consult for Coumadin Indication: atrial fibrillation  Allergies  Allergen Reactions  . Penicillins     hives   Labs:  Recent Labs  11/17/12 0504 11/18/12 0510 11/19/12 0536  LABPROT 39.7* 29.7* 21.9*  INR 4.47* 3.02* 2.00*  CREATININE 1.68* 1.58* 1.92*    Assessment: [name] on Coumadin PTA for h/o afib presented 6/16 with SOB. CXR and BNP c/w CHF exacerbation. INR 4.14 on admit. Per Coumadin clinic 5/30, patient is currently on Coumadin 1mg  on MWF, 2mg  other days with last dose being 11/15/2012. Amiodarone PTA resumed. INR trended up to peak of 4.47 on 6/17.  Pt has a skin tear on R arm that has been bleeding but minimal per RN.    INR down to 2 today, no change in bleeding from skin tear on R arm. Scr up to 1.92. No CBC since 6/16.   Goal of Therapy:  INR 2-3 Monitor platelets by anticoagulation protocol: Yes   Plan:   Coumadin 2 mg x 1 tonight  Daily PT/INR  Pharmacy will f/u  Geoffry Paradise, PharmD, BCPS Pager: [phone] 10:06 AM Pharmacy #: [id number]

## 2012-11-19 NOTE — Progress Notes (Signed)
[facility] PROGRESS NOTE  [name]:[id number] DOB: 1932/12/22 DOA: 11/16/2012 PCP: Kristian Covey, MD  Assessment/Plan: Acute on chronic systolic CHF  -Continue furosemide 40 mg IV every 12 hours  -Echocardiogram 07/23/2012 showed EF 45-50% with diffuse HK  -negative 5.7L -TSH within normal limits -still volume overloaded, so will continue IV lasix despite   Acute on chronic renal failure (CKD III)  -creatine worse today, will repeat Bmet this afternoon and cut down diuretics depending on creatinine. -Suspect cardiorenal syndrome  -Baseline creatinine 1.2-1.5  -Monitor closely on IV furosemide   COPD  -stable -Continue Xopenex and Atrovent nebulizer   Atrial fibrillation  -Rate controlled  -Continue amiodarone  -Warfarin per pharmacy.  -INR better 3.0 today  Hypertension  -Continue metoprolol tartrate  -May need to add hydralazine/long-acting nitrate depending on blood pressure monitoring  -Renal insufficiency limits use of ACEi/ARB   Hypokalemia: -replace  Deconditioning  -social work following for SNF placement  -PT evaluation noted   Code Status: DNR Family Communication: no family at bedside.  Disposition Plan: SNF when stable   Consultants:  none  Procedures:  none  Antibiotics:  None  HPI/Subjective: Patient has no new complaints. No acute issues reported overnight, breathing improving  Objective: Filed Vitals:   11/18/12 2236 11/19/12 0622 11/19/12 0837 11/19/12 1013  BP: 135/66 144/65  115/70  Pulse: 72 67 67 67  Temp: 98.5 F (36.9 C) 97.9 F (36.6 C)    TempSrc: Oral Oral    Resp: 18 18 18    Height:      Weight:  73 kg (160 lb 15 oz)    SpO2: 95% 99% 98%     Intake/Output Summary (Last 24 hours) at 11/19/12 1219 Last data filed at 11/19/12 1000  Gross per 24 hour  Intake   1000 ml  Output   1900 ml  Net   -900 ml   Filed Weights   11/17/12 0500 11/18/12 0632 11/19/12 0622  Weight: 73.256 kg (161 lb 8  oz) 73.4 kg (161 lb 13.1 oz) 73 kg (160 lb 15 oz)    Exam:   General:  Pt in NAD, Alert and Awake  Cardiovascular: RRR, no MRG  Respiratory: decreased BS at bases  Abdomen: soft, NT, ND  Musculoskeletal: 2 plus edema no cyanosis or clubbing   Data Reviewed: Basic Metabolic Panel:  Recent Labs Lab 11/16/12 0604 11/17/12 0504 11/18/12 0510 11/19/12 0536  NA 143 145 145 143  K 3.7 3.3* 3.2* 4.4  CL 106 105 102 100  CO2 31 34* 37* 39*  GLUCOSE 98 96 85 86  BUN 22 19 17 20   CREATININE 1.69* 1.68* 1.58* 1.92*  CALCIUM 8.5 8.2* 8.3* 8.3*   Liver Function Tests: No results found for this basename: AST, ALT, ALKPHOS, BILITOT, PROT, ALBUMIN,  in the last 168 hours No results found for this basename: LIPASE, AMYLASE,  in the last 168 hours No results found for this basename: AMMONIA,  in the last 168 hours CBC:  Recent Labs Lab 11/16/12 0604  WBC 7.2  HGB 9.5*  HCT 30.8*  MCV 91.9  PLT 297   Cardiac Enzymes:  Recent Labs Lab 11/16/12 0604  TROPONINI <0.30   BNP (last 3 results)  Recent Labs  07/23/12 0340 10/13/12 0330 11/16/12 0604  PROBNP 13477.0* 2422.0* 9840.0*   CBG: No results found for this basename: GLUCAP,  in the last 168 hours  Recent Results (from the past 240 hour(s))  MRSA PCR SCREENING  Status: Abnormal   Collection Time    11/16/12 12:47 PM      Result Value Range Status   MRSA by PCR POSITIVE (*) NEGATIVE Final   Comment:            The GeneXpert MRSA Assay (FDA     approved for NASAL specimens     only), is one component of a     comprehensive MRSA colonization     surveillance program. It is not     intended to diagnose MRSA     infection nor to guide or     monitor treatment for     MRSA infections.     RESULT CALLED TO, READ BACK BY AND VERIFIED WITH:     DEUTSCH,J. AT 1407 ON 06.16.14 BY LOVE,T.     Studies: No results found.  Scheduled Meds: . amiodarone  200 mg Oral TID  . budesonide-formoterol  2 puff  Inhalation BID  . calcium carbonate  1 tablet Oral TID  . Chlorhexidine Gluconate Cloth  6 each Topical Q0600  . dutasteride  0.5 mg Oral Daily  . feeding supplement  237 mL Oral Q24H  . feeding supplement  1 Container Oral Q24H  . ferrous sulfate  325 mg Oral BID WC  . furosemide  40 mg Intravenous BID  . ipratropium  0.5 mg Nebulization QID  . levalbuterol  1.25 mg Nebulization BID  . metoprolol  50 mg Oral BID  . mirtazapine  7.5 mg Oral QHS  . multivitamin with minerals  1 tablet Oral Daily  . mupirocin ointment  1 application Nasal BID  . pantoprazole  40 mg Oral BID WC  . PARoxetine  10 mg Oral Daily  . potassium chloride  40 mEq Oral Daily  . saccharomyces boulardii  250 mg Oral BID  . simvastatin  20 mg Oral QHS  . sodium chloride  3 mL Intravenous Q12H  . warfarin  2 mg Oral ONCE-1800  . Warfarin - Pharmacist Dosing Inpatient   Does not apply q1800   Continuous Infusions:   Active Problems:   CORONARY ARTERY DISEASE   FIBRILLATION, ATRIAL   Atrial fibrillation with bradycardia   HTN (hypertension)   COPD (chronic obstructive pulmonary disease)   Depression   Acute on chronic systolic CHF (congestive heart failure)   Acute on chronic renal failure    Time spent: > 35 minutes    Kenyana Husak  [facility] Pager [phone] If 7PM-7AM, please contact night-coverage at [email or web address], password [facility] 11/19/2012, 12:19 PM  LOS: 3 days

## 2012-11-19 NOTE — Care Management Note (Addendum)
    Page 1 of 1   11/20/2012     1:08:58 PM   CARE MANAGEMENT NOTE 11/20/2012  Patient:  JARIOUS, LYON   Account Number:  [id number]  Date Initiated:  11/19/2012  Documentation initiated by:  Lanier Clam  Subjective/Objective Assessment:   ADMITTED W/SOB.CHF.     Action/Plan:   FROM HOME.   Anticipated DC Date:  11/20/2012   Anticipated DC Plan:  SKILLED NURSING FACILITY      DC Planning Services  CM consult      Choice offered to / List presented to:             Status of service:  Completed, signed off Medicare Important Message given?   (If response is "NO", the following Medicare IM given date fields will be blank) Date Medicare IM given:   Date Additional Medicare IM given:    Discharge Disposition:  SKILLED NURSING FACILITY  Per UR Regulation:  Reviewed for med. necessity/level of care/duration of stay  If discussed at Long Length of Stay Meetings, dates discussed:    Comments:  11/20/12 Tamira Ryland RN,BSN NCM [phone] D/C SNF.  11/19/12 Filmore Molyneux RN,BSN NCM [phone] PT-SNF.D/C PLAN SNF.

## 2012-11-20 ENCOUNTER — Encounter: Payer: Medicare Other | Admitting: Family

## 2012-11-20 ENCOUNTER — Ambulatory Visit: Payer: Medicare Other | Admitting: Family Medicine

## 2012-11-20 LAB — BASIC METABOLIC PANEL
BUN: 23 mg/dL (ref 6–23)
CO2: 42 mEq/L (ref 19–32)
Glucose, Bld: 87 mg/dL (ref 70–99)
Potassium: 4.1 mEq/L (ref 3.5–5.1)
Sodium: 141 mEq/L (ref 135–145)

## 2012-11-20 MED ORDER — WARFARIN SODIUM 2 MG PO TABS
2.0000 mg | ORAL_TABLET | Freq: Once | ORAL | Status: DC
  Filled 2012-11-20: qty 1

## 2012-11-20 MED ORDER — ENSURE PUDDING PO PUDG: 1.0000 | ORAL | Status: DC

## 2012-11-20 MED ORDER — POTASSIUM CHLORIDE CRYS ER 20 MEQ PO TBCR: 20.0000 meq | EXTENDED_RELEASE_TABLET | Freq: Two times a day (BID) | ORAL | Status: DC

## 2012-11-20 NOTE — Progress Notes (Signed)
Pt being discharged to [facility] via EMS; report called to bobbi, RN at facility who stated understanding and denied questions/concerns; pt stable at time of discharge

## 2012-11-20 NOTE — Progress Notes (Signed)
ANTICOAGULATION CONSULT NOTE - Follow Up  Pharmacy Consult for Coumadin Indication: atrial fibrillation  Allergies  Allergen Reactions  . Penicillins     hives   Labs:  Recent Labs  11/18/12 0510 11/19/12 0536 11/19/12 1407 11/20/12 0528  LABPROT 29.7* 21.9*  --  18.9*  INR 3.02* 2.00*  --  1.64*  CREATININE 1.58* 1.92* 2.05* 2.15*    Assessment: 80 yom on Coumadin PTA for h/o afib presented 6/16 with SOB. CXR and BNP c/w CHF exacerbation. INR 4.14 on admit. Per Coumadin clinic 5/30, patient is currently on Coumadin 1mg  on MWF, 2mg  other days with last dose being 11/15/2012. Amiodarone PTA resumed. INR trended up to peak of 4.47 on 6/17.  Pt has a skin tear on R arm that has been bleeding but minimal per RN.    INR further down to 1.64 today. Scr up to 2.15. No CBC since 6/16. Possible discharge to SNF today.   Goal of Therapy:  INR 2-3 Monitor platelets by anticoagulation protocol: Yes   Plan:   Repeat Coumadin 2 mg x 1 tonight.   IF discharge today, recommend 2 mg tonight then continue PTA regimen of 1mg  MWF, 2mg  other days of the week starting tomorrow (6/21)  Daily PT/INR   Geoffry Paradise, PharmD, BCPS Pager: [phone] 10:10 AM Pharmacy #: [id number]

## 2012-11-20 NOTE — Progress Notes (Addendum)
Patient cleared for discharge. Packet copied and placed in [facility]. Patient agreeable to same. ptar called for transport.  Brentley Horrell C. Via Rosado MSW, LCSW [phone]

## 2012-11-20 NOTE — Progress Notes (Signed)
Clinical Social Work Department CLINICAL SOCIAL WORK PLACEMENT NOTE 11/20/2012  Patient:  [name], [name]  Account Number:  [id number] Admit date:  11/16/2012  Clinical Social Worker:  Becky Sax, LCSW  Date/time:  11/20/2012 12:00 M  Clinical Social Work is seeking post-discharge placement for this patient at the following level of care:   SKILLED NURSING   (*CSW will update this form in Epic as items are completed)   11/20/2012  Patient/family provided with [facility] Department of Clinical Social Work's list of facilities offering this level of care within the geographic area requested by the patient (or if unable, by the patient's family).  11/20/2012  Patient/family informed of their freedom to choose among providers that offer the needed level of care, that participate in Medicare, Medicaid or managed care program needed by the patient, have an available bed and are willing to accept the patient.  11/20/2012  Patient/family informed of [facility]' ownership interest in [facility], as well as of the fact that they are under no obligation to receive care at this facility.  PASARR submitted to EDS on 11/20/2012 PASARR number received from EDS on 11/20/2012  FL2 transmitted to all facilities in geographic area requested by pt/family on  11/20/2012 FL2 transmitted to all facilities within larger geographic area on 11/20/2012  Patient informed that his/her managed care company has contracts with or will negotiate with  certain facilities, including the following:     Patient/family informed of bed offers received:  11/20/2012 Patient chooses bed at [facility] Physician recommends and patient chooses bed at    Patient to be transferred to [facility] on  11/20/2012 Patient to be transferred to facility by ptar  The following physician request were entered in Epic:   Additional Comments:

## 2012-11-20 NOTE — Discharge Summary (Signed)
Physician Discharge Summary  [name] ZOX:[id number] DOB: November 30, 1932 DOA: 11/16/2012  PCP: Kristian Covey, MD  Admit date: 11/16/2012 Discharge date: 11/20/2012  Time spent: 45 minutes  Recommendations for Outpatient Follow-up:  1. PCP in 1 week 2. Bmet in 4-5 days  Discharge Diagnoses:    Acute on CHornic systolic and diastolic CHF   CKD 3   CORONARY ARTERY DISEASE   FIBRILLATION, ATRIAL   Atrial fibrillation with bradycardia   HTN (hypertension)   COPD (chronic obstructive pulmonary disease)   Depression   Acute on chronic renal failure   Discharge Condition: stable  Diet recommendation: low sodium, heart healthy  Filed Weights   11/19/12 0622 11/20/12 0500 11/20/12 0548  Weight: 73 kg (160 lb 15 oz) 66 kg (145 lb 8.1 oz) 66 kg (145 lb 8.1 oz)    History of present illness:  79 year old male with a history of systolic CHF (EF of 45-50%), COPD, atrial fibrillation, hyperlipidemia, CAD, presents with one-week history of shortness of breath. The patient's wife actually called the ambulance because of bleeding from a skin tear. When EMS arrived, the patient was noted to be in respiratory distress. The patient was placed on CPAP and brought to the emergency department. Further history from the patient's wife reveals that the patient has been short of breath for one to 2 weeks. She has also noted increasing lower extremity edema. The patient does not weigh himself daily. From his last admission, the patient's furosemide was discontinued at discharge. His primary care provider instructed the patient to take 3 days worth of furosemide approximately one to 2 weeks ago. He has not had any furosemide since that time. In the emergency department, the patient was given furosemide 40 mg IV x1 with improvement of his respiratory status. He currently has an oxygen saturation of 95-96% on nasal cannula. Chest x-ray reveals pulmonary edema and pleural effusions. EKG showed atrial fibrillation  with right bundle branch block. CBC showed hemoglobin 9.5. BMP showed serum creatinine 1.8. ProBNP was 9840. Initial troponin was negative.   Hospital Course:  Acute on chronic systolic CHF  -diuresed aggressively with IV lasix -lost 10kg of fluid weight and negative 8.1L -Echocardiogram 07/23/2012 showed EF 45-50% with diffuse HK  -TSH within normal limits  -changed to Po lasix at home dose, slight bump in creatinine to 2.1 from 1.8 with diuresis, needs repeat Bmet in 1 week  Acute on chronic renal failure (CKD III)  -baseline creatinine ranging from 1.6 to 1.8, creatinine bumped up slightly with diuresis and now stable at 2.0-2.1 at discharge -needs repeat Bmet in 1 week  COPD  -stable  -Continue Xopenex and Atrovent nebulizer   Atrial fibrillation  -Rate controlled  -Continue amiodarone  -Warfarin per pharmacy.  -INR therapeutic  Hypertension  -Continue metoprolol tartrate  -Renal insufficiency limits use of ACEi/ARB   Hypokalemia:  -replaced   Deconditioning  -social work following for SNF placement  -PT evaluation noted      Discharge Exam: Filed Vitals:   11/19/12 2100 11/20/12 0500 11/20/12 0548 11/20/12 0748  BP: 145/66  132/64   Pulse: 65  65   Temp: 98.3 F (36.8 C)  98.5 F (36.9 C)   TempSrc: Oral  Oral   Resp: 16  16   Height:      Weight:  66 kg (145 lb 8.1 oz) 66 kg (145 lb 8.1 oz)   SpO2: 98%  97% 97%    General: AAOx3 Cardiovascular: S1S2/Irregular rate  Respiratory: diminished BS  at bases  Discharge Instructions  Discharge Orders   Future Appointments Provider Department Dept Phone   11/20/2012 1:00 PM Baker Pierini, FNP [facility] [place] [phone]   11/20/2012 1:15 PM Kristian Covey, MD [facility] [place] [phone]   Future Orders Complete By Expires     Diet - low sodium heart healthy  As directed     Increase activity slowly  As directed         Medication List    TAKE these  medications       amiodarone 200 MG tablet  Commonly known as:  PACERONE  TAKE  (1)  TABLET  THREE TIMES DAILY.     budesonide-formoterol 160-4.5 MCG/ACT inhaler  Commonly known as:  SYMBICORT  Inhale 2 puffs into the lungs 2 (two) times daily.     calcium carbonate 500 MG chewable tablet  Commonly known as:  TUMS - dosed in mg elemental calcium  Chew 1 tablet by mouth 3 (three) times daily.     dutasteride 0.5 MG capsule  Commonly known as:  AVODART  Take 0.5 mg by mouth daily.     feeding supplement Pudg  Take 1 Container by mouth daily.     ferrous sulfate 325 (65 FE) MG tablet  Take 1 tablet (325 mg total) by mouth 2 (two) times daily with a meal.     furosemide 20 MG tablet  Commonly known as:  LASIX  Take 20 mg by mouth 2 (two) times daily.     ipratropium 0.02 % nebulizer solution  Commonly known as:  ATROVENT  Take 2.5 mLs (0.5 mg total) by nebulization 4 (four) times daily.     levalbuterol 0.63 MG/3ML nebulizer solution  Commonly known as:  XOPENEX  Take 1 ampule by nebulization 2 (two) times daily as needed for wheezing or shortness of breath. 1 vial in neb twice daily with Ipratropium.  May take extra neb every 4 hours if needed for wheezing/shortness of breath     metoprolol 50 MG tablet  Commonly known as:  LOPRESSOR  Take 50 mg by mouth 2 (two) times daily. 50 mg at lunch and 25 mg at bedtime     mirtazapine 7.5 MG tablet  Commonly known as:  REMERON  Take 7.5 mg by mouth at bedtime.     pantoprazole 40 MG tablet  Commonly known as:  PROTONIX  Take 40 mg by mouth 2 (two) times daily.     PARoxetine 10 MG tablet  Commonly known as:  PAXIL  Take 10 mg by mouth daily.     potassium chloride SA 20 MEQ tablet  Commonly known as:  K-DUR,KLOR-CON  Take 1 tablet (20 mEq total) by mouth 2 (two) times daily.     saccharomyces boulardii 250 MG capsule  Commonly known as:  FLORASTOR  Take 1 capsule (250 mg total) by mouth 2 (two) times daily.      simvastatin 20 MG tablet  Commonly known as:  ZOCOR  Take 20 mg by mouth at bedtime.     warfarin 1 MG tablet  Commonly known as:  COUMADIN  Take 1-2 mg by mouth as directed. Coumadin 1mg  on MWF, 2mg  other days       Allergies  Allergen Reactions  . Penicillins     hives       Follow-up Information   Follow up with Kristian Covey, MD In 1 week.   Contact information:   [address] [facility]  [place] [address] [phone]        The results of significant diagnostics from this hospitalization (including imaging, microbiology, ancillary and laboratory) are listed below for reference.    Significant Diagnostic Studies: Dg Chest 2 View  10/21/2012   *RADIOLOGY REPORT*  Clinical Data: Larey Seat onto floor last night, head injury, former smoker, hypertension  CHEST - 2 VIEW  Comparison: 10/12/2012  Findings: Enlargement of cardiac silhouette post median sternotomy. Enlargement of hila particularly right hilum again seen. Pulmonary vascularity normal. Left lower lobe atelectasis versus consolidation with small left pleural effusion. Minimal atelectasis versus scarring right base. Upper lungs clear. No pneumothorax. Diffuse osseous demineralization. Skin folds project over left chest.  IMPRESSION: Enlargement of cardiac silhouette post median sternotomy. Persistent hilar enlargement unchanged from prior exam, suspect related to enlarged central pulmonary arteries and pulmonary arterial hypertension. Left lower lobe atelectasis versus consolidation and small left pleural effusion unchanged since 10/12/2012.   Original Report Authenticated By: Ulyses Southward, M.D.   Ct Head Wo Contrast  10/21/2012   *RADIOLOGY REPORT*  Clinical Data:  Fall.  Head injury  CT HEAD WITHOUT CONTRAST CT CERVICAL SPINE WITHOUT CONTRAST  Technique:  Multidetector CT imaging of the head and cervical spine was performed following the standard protocol without intravenous contrast.  Multiplanar CT image reconstructions  of the cervical spine were also generated.  Comparison:   None  CT HEAD  Findings: Right frontal laceration and mild scalp hematoma. Negative for fracture.  Moderate atrophy and moderate chronic microvascular ischemic change in the white matter.  No acute infarct.  Negative for hemorrhage or mass.  No shift of the midline structures.  IMPRESSION: Atrophy and chronic microvascular ischemia.  No acute abnormality.  CT CERVICAL SPINE  Findings: Disc degeneration and mild to moderate spondylosis at C3- 4 and C4-5 with associated facet degeneration.  There is advanced disc degeneration and spondylosis at C5-6 contributing to spinal stenosis and right foraminal encroachment.  Mild disc degeneration at C6-7.  Negative for fracture.  Carotid artery calcification.  IMPRESSION: Cervical spondylosis most severe at C5-6.  Negative for fracture.   Original Report Authenticated By: Janeece Riggers, M.D.   Ct Cervical Spine Wo Contrast  10/21/2012   *RADIOLOGY REPORT*  Clinical Data:  Fall.  Head injury  CT HEAD WITHOUT CONTRAST CT CERVICAL SPINE WITHOUT CONTRAST  Technique:  Multidetector CT imaging of the head and cervical spine was performed following the standard protocol without intravenous contrast.  Multiplanar CT image reconstructions of the cervical spine were also generated.  Comparison:   None  CT HEAD  Findings: Right frontal laceration and mild scalp hematoma. Negative for fracture.  Moderate atrophy and moderate chronic microvascular ischemic change in the white matter.  No acute infarct.  Negative for hemorrhage or mass.  No shift of the midline structures.  IMPRESSION: Atrophy and chronic microvascular ischemia.  No acute abnormality.  CT CERVICAL SPINE  Findings: Disc degeneration and mild to moderate spondylosis at C3- 4 and C4-5 with associated facet degeneration.  There is advanced disc degeneration and spondylosis at C5-6 contributing to spinal stenosis and right foraminal encroachment.  Mild disc degeneration  at C6-7.  Negative for fracture.  Carotid artery calcification.  IMPRESSION: Cervical spondylosis most severe at C5-6.  Negative for fracture.   Original Report Authenticated By: Janeece Riggers, M.D.   Dg Chest Port 1 View  11/16/2012   *RADIOLOGY REPORT*  Clinical Data: Shortness of breath.  PORTABLE CHEST - 1 VIEW  Comparison: Multiple priors, most recently chest  x-ray 10/21/2012.  Findings: There is cephalization of the pulmonary vasculature, indistinctness of the interstitial markings, and patchy airspace disease throughout the lungs bilaterally suggestive of moderate pulmonary edema.   Bilateral pleural effusions (moderate on the left and small on the right).  Mild cardiomegaly.  Dilatation of the central pulmonary arteries, compatible with pulmonary arterial hypertension. The patient is rotated to the left on today's exam, resulting in distortion of the mediastinal contours and reduced diagnostic sensitivity and specificity for mediastinal pathology. Status post median sternotomy.  IMPRESSION: 1.  The appearance of the chest suggests congestive heart failure, as above. 2.  Dilatation of pulmonic arteries suggestive of pulmonary arterial hypertension.   Original Report Authenticated By: Trudie Reed, M.D.    Microbiology: Recent Results (from the past 240 hour(s))  MRSA PCR SCREENING     Status: Abnormal   Collection Time    11/16/12 12:47 PM      Result Value Range Status   MRSA by PCR POSITIVE (*) NEGATIVE Final   Comment:            The GeneXpert MRSA Assay (FDA     approved for NASAL specimens     only), is one component of a     comprehensive MRSA colonization     surveillance program. It is not     intended to diagnose MRSA     infection nor to guide or     monitor treatment for     MRSA infections.     RESULT CALLED TO, READ BACK BY AND VERIFIED WITH:     DEUTSCH,J. AT 1407 ON 06.16.14 BY LOVE,T.     Labs: Basic Metabolic Panel:  Recent Labs Lab 11/17/12 0504  11/18/12 0510 11/19/12 0536 11/19/12 1407 11/20/12 0528  NA 145 145 143 139 141  K 3.3* 3.2* 4.4 4.4 4.1  CL 105 102 100 97 97  CO2 34* 37* 39* 38* 42*  GLUCOSE 96 85 86 135* 87  BUN 19 17 20 22 23   CREATININE 1.68* 1.58* 1.92* 2.05* 2.15*  CALCIUM 8.2* 8.3* 8.3* 8.1* 8.8   Liver Function Tests: No results found for this basename: AST, ALT, ALKPHOS, BILITOT, PROT, ALBUMIN,  in the last 168 hours No results found for this basename: LIPASE, AMYLASE,  in the last 168 hours No results found for this basename: AMMONIA,  in the last 168 hours CBC:  Recent Labs Lab 11/16/12 0604  WBC 7.2  HGB 9.5*  HCT 30.8*  MCV 91.9  PLT 297   Cardiac Enzymes:  Recent Labs Lab 11/16/12 0604  TROPONINI <0.30   BNP: BNP (last 3 results)  Recent Labs  07/23/12 0340 10/13/12 0330 11/16/12 0604  PROBNP 13477.0* 2422.0* 9840.0*   CBG: No results found for this basename: GLUCAP,  in the last 168 hours     Signed:  Dawn Kiper  [facility] 11/20/2012, 10:34 AM

## 2013-01-07 ENCOUNTER — Encounter: Payer: Self-pay | Admitting: Family Medicine

## 2013-01-07 ENCOUNTER — Ambulatory Visit (INDEPENDENT_AMBULATORY_CARE_PROVIDER_SITE_OTHER): Payer: Medicare Other | Admitting: Family Medicine

## 2013-01-07 VITALS — BP 120/68 | HR 58 | Temp 98.4°F | Wt 159.0 lb

## 2013-01-07 DIAGNOSIS — I10 Essential (primary) hypertension: Secondary | ICD-10-CM

## 2013-01-07 DIAGNOSIS — D649 Anemia, unspecified: Secondary | ICD-10-CM

## 2013-01-07 DIAGNOSIS — I5032 Chronic diastolic (congestive) heart failure: Secondary | ICD-10-CM

## 2013-01-07 DIAGNOSIS — R627 Adult failure to thrive: Secondary | ICD-10-CM

## 2013-01-07 DIAGNOSIS — I4891 Unspecified atrial fibrillation: Secondary | ICD-10-CM

## 2013-01-07 DIAGNOSIS — I2510 Atherosclerotic heart disease of native coronary artery without angina pectoris: Secondary | ICD-10-CM

## 2013-01-07 LAB — CBC WITH DIFFERENTIAL/PLATELET
Basophils Absolute: 0 10*3/uL (ref 0.0–0.1)
Eosinophils Absolute: 0.1 10*3/uL (ref 0.0–0.7)
Eosinophils Relative: 1.4 % (ref 0.0–5.0)
HCT: 31 % — ABNORMAL LOW (ref 39.0–52.0)
Lymphs Abs: 1.3 10*3/uL (ref 0.7–4.0)
MCHC: 32.3 g/dL (ref 30.0–36.0)
MCV: 90 fl (ref 78.0–100.0)
Monocytes Absolute: 0.8 10*3/uL (ref 0.1–1.0)
Neutrophils Relative %: 78.1 % — ABNORMAL HIGH (ref 43.0–77.0)
Platelets: 236 10*3/uL (ref 150.0–400.0)
RDW: 16.5 % — ABNORMAL HIGH (ref 11.5–14.6)

## 2013-01-07 LAB — BASIC METABOLIC PANEL
BUN: 23 mg/dL (ref 6–23)
CO2: 31 mEq/L (ref 19–32)
Chloride: 105 mEq/L (ref 96–112)
Creatinine, Ser: 1.6 mg/dL — ABNORMAL HIGH (ref 0.4–1.5)
Glucose, Bld: 92 mg/dL (ref 70–99)
Potassium: 5 mEq/L (ref 3.5–5.1)

## 2013-01-07 NOTE — Progress Notes (Signed)
Subjective:    Patient ID: [name], male    DOB: March 24, 1933, 79 y.o.   MRN: [id number]  HPI Medical followup. Very complex past medical history. Multiple admissions during the past year. He has history of cerebrovascular disease, GERD, history of esophageal stricture, stage III chronic kidney disease, history peripheral neuropathy, history of B12 deficiency, chronic anemia, failure to thrive, hypertension, hyperlipidemia, atrial fibrillation, CAD, COPD, and diastolic heart failure.  Most recent admission 11/16/2012 to 11/20/2012. Admitted with acute on chronic systolic and diastolic heart failure. He was diuresised and sent to rehabilitation and then to home last week. Patient had echocardiogram back in February 2014 with ejection fraction 45-50% with diffuse hypokinesis. Recent TSH normal. Hospital notes reviewed.  Currently taking furosemide 20 mg twice daily. They've been instructed to do daily weights are not currently doing  He has history of chronic atrial fibrillation and has been on Coumadin. By report, his INR apparently was elevated prior to leaving rehabilitation and he was told to hold this for several days. He apparently has not started back now for over one week.  He has history of depression and is currently taking low dose Remeron Which we had started in hopes of stimulating his appetite. His appetite, his wife thinks, has improved slightly. His weight is stable since recent hospital discharge.  He continues to feel poorly overall terms of weakness. Low motivation for activity. Had extensive physical therapy in the past. No recent falls  Past Medical History  Diagnosis Date  . History of upper gastrointestinal hemorrhage     dr. Dickie La, GI  . Cerebrovascular accident 110  . Gastric polyp   . Dysphagia, unspecified(787.20)   . Coronary artery disease     a. Nonobstructive by cath 2009.  [name] Permanent atrial fibrillation     INR followed at [facility].  [name] Hypertension   .  Esophagitis   . GERD (gastroesophageal reflux disease)   . Erosive gastritis     H/o GIB felt secondary to this  . Gastric ulcer   . Hiatal hernia   . Internal hemorrhoids   . Hearing impairment   . Vision problems   . Chronic bronchitis     a. h/o resp failure in setting of bronchitis vs PNA 05/2012.  [name] Hyperlipidemia   . ASD (atrial septal defect)     a. s/p repair 1983.  [name] BPH (benign prostatic hyperplasia)   . COPD (chronic obstructive pulmonary disease)   . Anemia   . Depression   . LV dysfunction     a. EF 45-50% by echo 05/2012.  [name] Chronic diarrhea    Past Surgical History  Procedure Laterality Date  . Cholecystectomy  1990s  . Asd repair  [id number]  . Inguinal hernia repair  1963  . Partial small bowel resection  2005    ischemic?  [name] Enteroscopy  05/13/2012    Procedure: ENTEROSCOPY;  Surgeon: Louis Meckel, MD;  Location: [facility] ENDOSCOPY;  Service: Endoscopy;  Laterality: N/A;  . Cardioversion      possibly 3 times, dr. brody/cooper    reports that he quit smoking about 29 years ago. His smoking use included Cigarettes. He has a 20 pack-year smoking history. He has never used smokeless tobacco. He reports that he does not drink alcohol or use illicit drugs. family history includes Heart disease in his father and mother and Prostate cancer in his brother. Allergies  Allergen Reactions  . Penicillins     hives  Review of Systems  Constitutional: Positive for fatigue. Negative for fever, chills and unexpected weight change.  HENT: Negative for trouble swallowing.   Respiratory: Negative for cough.   Cardiovascular: Positive for leg swelling (Chronic and unchanged). Negative for chest pain.  Gastrointestinal: Negative for nausea, vomiting, abdominal pain and diarrhea.  Endocrine: Negative for polydipsia and polyuria.  Genitourinary: Negative for dysuria.  Skin: Negative for rash.  Neurological: Positive for weakness (generalized). Negative for dizziness,  syncope and headaches.       Objective:   Physical Exam  Constitutional: He appears well-developed and well-nourished.  HENT:  Mouth/Throat: Oropharynx is clear and moist.  Neck: Neck supple. No thyromegaly present.  Cardiovascular: Normal rate.   Pulmonary/Chest: Effort normal and breath sounds normal. No respiratory distress. He has no wheezes. He has no rales.  Abdominal: Soft. There is no tenderness.  Musculoskeletal: He exhibits edema.  Trace edema lower legs bilaterally  Neurological: He is alert. No cranial nerve deficit.  Skin: No rash noted.  Psychiatric: He has a normal mood and affect. His behavior is normal.          Assessment & Plan:  #1 history of CAD and systolic as well as diastolic heart failure. We've recommended reestablishing with cardiology. Emphasized importance of daily weights. Continue furosemide 20 mg twice a day. We'll need to get his Coumadin started back. Even though he has remote upper GI bleeds in the past he has been on Coumadin for quite some time now and stable. He does have increased risk of fall but he ambulates with walker #2 hypothyroidism. Recent TSH normal #3 chronic anemia. Recheck CBC #4 failure to thrive. Per wife, his appetite is stable. Continue Remeron #5 history of depression. He is currently taking low dose Paxil as well as Remeron. Not clear he is benefiting from Paxil. We'll discontinue to reduce risk of side effects #6 CKD  Recheck BMP

## 2013-01-11 ENCOUNTER — Telehealth: Payer: Self-pay | Admitting: General Practice

## 2013-01-11 ENCOUNTER — Telehealth: Payer: Self-pay | Admitting: Family Medicine

## 2013-01-11 ENCOUNTER — Other Ambulatory Visit: Payer: Self-pay | Admitting: Family Medicine

## 2013-01-11 ENCOUNTER — Ambulatory Visit (INDEPENDENT_AMBULATORY_CARE_PROVIDER_SITE_OTHER): Payer: Self-pay | Admitting: General Practice

## 2013-01-11 DIAGNOSIS — I4891 Unspecified atrial fibrillation: Secondary | ICD-10-CM

## 2013-01-11 LAB — POCT INR: INR: 1.3

## 2013-01-11 NOTE — Telephone Encounter (Signed)
Returned call in regards to coumadin dosing.  Line busy.  Will try again.

## 2013-01-11 NOTE — Telephone Encounter (Signed)
Call placed to [facility] to give VO for pt/inr.

## 2013-01-11 NOTE — Telephone Encounter (Signed)
Pt's wife request info concerning pt's coum. Not sure what they need to do. pls call asap!

## 2013-01-15 ENCOUNTER — Other Ambulatory Visit: Payer: Self-pay | Admitting: Family Medicine

## 2013-01-18 ENCOUNTER — Ambulatory Visit (INDEPENDENT_AMBULATORY_CARE_PROVIDER_SITE_OTHER): Payer: Self-pay | Admitting: General Practice

## 2013-01-18 DIAGNOSIS — I4891 Unspecified atrial fibrillation: Secondary | ICD-10-CM

## 2013-01-18 LAB — POCT INR: INR: 1.5

## 2013-01-19 ENCOUNTER — Other Ambulatory Visit: Payer: Self-pay

## 2013-01-19 MED ORDER — POTASSIUM CHLORIDE CRYS ER 20 MEQ PO TBCR: 20.0000 meq | EXTENDED_RELEASE_TABLET | Freq: Two times a day (BID) | ORAL | Status: DC

## 2013-01-21 ENCOUNTER — Telehealth: Payer: Self-pay | Admitting: Family Medicine

## 2013-01-21 NOTE — Telephone Encounter (Signed)
Decrease Lopressor to 25 mg (one half tablet) twice daily.

## 2013-01-21 NOTE — Telephone Encounter (Signed)
RN from [facility] care called. BP running low. Today: 90/52 standing; 96/58 sitting. Please advise. Had previously been running for past week: 85/53 up to 133/72. Pulses ranging 47 - 61. Patient complains of dizziness when he stands x2 days.

## 2013-01-25 ENCOUNTER — Ambulatory Visit (INDEPENDENT_AMBULATORY_CARE_PROVIDER_SITE_OTHER): Payer: Self-pay | Admitting: General Practice

## 2013-01-25 DIAGNOSIS — I4891 Unspecified atrial fibrillation: Secondary | ICD-10-CM

## 2013-01-25 LAB — POCT INR: INR: 2.4

## 2013-01-25 NOTE — Telephone Encounter (Signed)
Called patient 2 times no answer left message for patient to return call

## 2013-02-03 ENCOUNTER — Ambulatory Visit (INDEPENDENT_AMBULATORY_CARE_PROVIDER_SITE_OTHER): Payer: Medicare Other | Admitting: General Practice

## 2013-02-03 DIAGNOSIS — I4891 Unspecified atrial fibrillation: Secondary | ICD-10-CM

## 2013-02-05 ENCOUNTER — Encounter: Payer: Self-pay | Admitting: Family Medicine

## 2013-02-05 ENCOUNTER — Ambulatory Visit (INDEPENDENT_AMBULATORY_CARE_PROVIDER_SITE_OTHER): Payer: Medicare Other | Admitting: Family Medicine

## 2013-02-05 ENCOUNTER — Other Ambulatory Visit (INDEPENDENT_AMBULATORY_CARE_PROVIDER_SITE_OTHER): Payer: Medicare Other

## 2013-02-05 VITALS — BP 118/62 | HR 58 | Temp 97.6°F | Wt 157.0 lb

## 2013-02-05 DIAGNOSIS — I4891 Unspecified atrial fibrillation: Secondary | ICD-10-CM

## 2013-02-05 DIAGNOSIS — Z23 Encounter for immunization: Secondary | ICD-10-CM

## 2013-02-05 DIAGNOSIS — I10 Essential (primary) hypertension: Secondary | ICD-10-CM

## 2013-02-05 DIAGNOSIS — N179 Acute kidney failure, unspecified: Secondary | ICD-10-CM

## 2013-02-05 DIAGNOSIS — E538 Deficiency of other specified B group vitamins: Secondary | ICD-10-CM

## 2013-02-05 DIAGNOSIS — D649 Anemia, unspecified: Secondary | ICD-10-CM

## 2013-02-05 DIAGNOSIS — N183 Chronic kidney disease, stage 3 (moderate): Secondary | ICD-10-CM

## 2013-02-05 DIAGNOSIS — N189 Chronic kidney disease, unspecified: Secondary | ICD-10-CM

## 2013-02-05 LAB — BASIC METABOLIC PANEL
BUN: 32 mg/dL — ABNORMAL HIGH (ref 6–23)
Calcium: 8.5 mg/dL (ref 8.4–10.5)
Creatinine, Ser: 1.8 mg/dL — ABNORMAL HIGH (ref 0.4–1.5)
GFR: 37.94 mL/min — ABNORMAL LOW (ref >60.00)
Glucose, Bld: 98 mg/dL (ref 70–99)
Potassium: 4.1 mEq/L (ref 3.5–5.1)

## 2013-02-05 LAB — VITAMIN B12: Vitamin B-12: 285 pg/mL (ref 211–911)

## 2013-02-05 NOTE — Progress Notes (Signed)
Subjective:    Patient ID: [name], male    DOB: 26-Jul-1932, 79 y.o.   MRN: [id number]  HPI Patient seen for medical followup.  Very complicated past medical history. He's had failure to thrive over several months. He has had acute on chronic heart failure, acute on chronic kidney disease, history of CAD, hypertension, atrial fibrillation, chronic normocytic anemia, history of vitamin B12 deficiency, COPD, stage III chronic kidney disease.  He's been recently well since followup last month. We discontinued Paxil and continued Remeron. He thinks this has helped his appetite. Weight is stable. No increased dyspnea. He thinks his energy levels are slightly improved. Recent creatinine improved at 1.6. Hemoglobin stable 10.0. He has previously received B12 injections. Currently not taking any oral B12 or injectable B12.  Past Medical History  Diagnosis Date  . History of upper gastrointestinal hemorrhage     dr. Dickie La, GI  . Cerebrovascular accident 41  . Gastric polyp   . Dysphagia, unspecified(787.20)   . Coronary artery disease     a. Nonobstructive by cath 2009.  [name] Permanent atrial fibrillation     INR followed at [facility]  [name] Hypertension   . Esophagitis   . GERD (gastroesophageal reflux disease)   . Erosive gastritis     H/o GIB felt secondary to this  . Gastric ulcer   . Hiatal hernia   . Internal hemorrhoids   . Hearing impairment   . Vision problems   . Chronic bronchitis     a. h/o resp failure in setting of bronchitis vs PNA 05/2012.  [name] Hyperlipidemia   . ASD (atrial septal defect)     a. s/p repair 1983.  [name] BPH (benign prostatic hyperplasia)   . COPD (chronic obstructive pulmonary disease)   . Anemia   . Depression   . LV dysfunction     a. EF 45-50% by echo 05/2012.  [name] Chronic diarrhea    Past Surgical History  Procedure Laterality Date  . Cholecystectomy  1990s  . Asd repair  [id number]  . Inguinal hernia repair  1963  . Partial small bowel resection  2005    ischemic?  [name] Enteroscopy  05/13/2012    Procedure: ENTEROSCOPY;  Surgeon: Louis Meckel, MD;  Location: [facility] ENDOSCOPY;  Service: Endoscopy;  Laterality: N/A;  . Cardioversion      possibly 3 times, dr. brody/cooper    reports that he quit smoking about 29 years ago. His smoking use included Cigarettes. He has a 20 pack-year smoking history. He has never used smokeless tobacco. He reports that he does not drink alcohol or use illicit drugs. family history includes Heart disease in his father and mother; Prostate cancer in his brother. Allergies  Allergen Reactions  . Penicillins     hives      Review of Systems  Constitutional: Positive for fatigue. Negative for fever, chills, appetite change and unexpected weight change.  Respiratory: Negative for cough, shortness of breath and stridor.   Cardiovascular: Negative for chest pain, palpitations and leg swelling.  Gastrointestinal: Negative for abdominal pain and blood in stool.  Endocrine: Negative for polydipsia and polyuria.  Genitourinary: Negative for dysuria.  Neurological: Negative for dizziness.       Objective:   Physical Exam  Constitutional: He is oriented to person, place, and time. He appears well-developed and well-nourished.  Neck: Neck supple. No thyromegaly present.  Cardiovascular: Normal rate.   Pulmonary/Chest: Effort normal and breath sounds normal. No respiratory distress. He has no  wheezes. He has no rales.  Musculoskeletal: He exhibits no edema.  Neurological: He is alert and oriented to person, place, and time.  Psychiatric: He has a normal mood and affect. His behavior is normal.          Assessment & Plan:  #1 chronic normocytic anemia. History of vitamin B12 deficiency. Recheck B12 level. #2 chronic kidney disease stable #3 hypertension stable. No recent orthostasis #4 depression. Improved with Remeron. He states appetite is improved, though weight is unchanged #5 health maintenance. Flu  vaccine given

## 2013-02-05 NOTE — Patient Instructions (Addendum)
Anticoagulation Dose Instructions as of 02/05/2013     [name] Tue Wed Thu Fri Sat   New Dose 2 mg 1 mg 2 mg 1 mg 2 mg 1 mg 2 mg    recheck in 2 weeks!

## 2013-02-06 ENCOUNTER — Other Ambulatory Visit: Payer: Self-pay | Admitting: Family Medicine

## 2013-02-11 ENCOUNTER — Telehealth: Payer: Self-pay | Admitting: Internal Medicine

## 2013-02-11 MED ORDER — IPRATROPIUM BROMIDE 0.02 % IN SOLN: 0.5000 mg | Freq: Four times a day (QID) | RESPIRATORY_TRACT | Status: DC

## 2013-02-11 MED ORDER — LEVALBUTEROL HCL 0.63 MG/3ML IN NEBU: 1.0000 | INHALATION_SOLUTION | Freq: Two times a day (BID) | RESPIRATORY_TRACT | Status: DC | PRN

## 2013-02-11 NOTE — Telephone Encounter (Signed)
Spoke with the pt's spouse I advised that the pt needs appt for any refills Appt scheduled and she is aware to bring all medications with them  Rx refills sent x 1 only

## 2013-02-16 ENCOUNTER — Other Ambulatory Visit: Payer: Self-pay | Admitting: Family Medicine

## 2013-02-17 ENCOUNTER — Ambulatory Visit (INDEPENDENT_AMBULATORY_CARE_PROVIDER_SITE_OTHER): Payer: Medicare Other | Admitting: Internal Medicine

## 2013-02-17 ENCOUNTER — Encounter: Payer: Self-pay | Admitting: Internal Medicine

## 2013-02-17 VITALS — BP 129/55 | HR 48 | Ht 71.0 in | Wt 158.8 lb

## 2013-02-17 DIAGNOSIS — I498 Other specified cardiac arrhythmias: Secondary | ICD-10-CM

## 2013-02-17 DIAGNOSIS — R001 Bradycardia, unspecified: Secondary | ICD-10-CM | POA: Insufficient documentation

## 2013-02-17 DIAGNOSIS — I4891 Unspecified atrial fibrillation: Secondary | ICD-10-CM

## 2013-02-17 DIAGNOSIS — I10 Essential (primary) hypertension: Secondary | ICD-10-CM

## 2013-02-17 LAB — HEPATIC FUNCTION PANEL
ALT: 24 U/L (ref 0–53)
AST: 31 U/L (ref 0–37)
Albumin: 2.6 g/dL — ABNORMAL LOW (ref 3.5–5.2)
Alkaline Phosphatase: 83 U/L (ref 39–117)
Bilirubin, Direct: 0.1 mg/dL (ref 0.0–0.3)
Total Bilirubin: 0.4 mg/dL (ref 0.3–1.2)
Total Protein: 6.9 g/dL (ref 6.0–8.3)

## 2013-02-17 MED ORDER — METOPROLOL TARTRATE 50 MG PO TABS: 25.0000 mg | ORAL_TABLET | Freq: Two times a day (BID) | ORAL | Status: DC

## 2013-02-17 NOTE — Progress Notes (Signed)
PCP:  Darrell Covey, MD  The patient presents today for routine electrophysiology followup. I have not seen him in quite some time.   Since last being seen in our clinic,he has had several hospitalizations.  He was placed on amiodarone in February for rate control and has been on 600mg  every since.  He reports progressive fatigue.  He has occasional postural dizziness.  Today, he denies symptoms of palpitations, chest pain,  presyncope, syncope, or neurologic sequela.   His dizziness is stable. The patient feels that he is tolerating medications without difficulties and is otherwise without complaint today.   Past Medical History  Diagnosis Date  . History of upper gastrointestinal hemorrhage     dr. Dickie La, GI  . Cerebrovascular accident 55  . Gastric polyp   . Dysphagia, unspecified(787.20)   . Coronary artery disease     a. Nonobstructive by cath 2009.  [name] Permanent atrial fibrillation     INR followed at [facility].  [name] Hypertension   . Esophagitis   . GERD (gastroesophageal reflux disease)   . Erosive gastritis     H/o GIB felt secondary to this  . Gastric ulcer   . Hiatal hernia   . Internal hemorrhoids   . Hearing impairment   . Vision problems   . Chronic bronchitis     a. h/o resp failure in setting of bronchitis vs PNA 05/2012.  [name] Hyperlipidemia   . ASD (atrial septal defect)     a. s/p repair 1983.  [name] BPH (benign prostatic hyperplasia)   . COPD (chronic obstructive pulmonary disease)   . Anemia   . Depression   . LV dysfunction     a. EF 45-50% by echo 05/2012.  [name] Chronic diarrhea    Past Surgical History  Procedure Laterality Date  . Cholecystectomy  1990s  . Asd repair  [id number]  . Inguinal hernia repair  1963  . Partial small bowel resection  2005    ischemic?  [name] Enteroscopy  05/13/2012    Procedure: ENTEROSCOPY;  Surgeon: Louis Meckel, MD;  Location: [facility] ENDOSCOPY;  Service: Endoscopy;  Laterality: N/A;  . Cardioversion      possibly 3 times, dr.  brody/cooper    Current Outpatient Prescriptions  Medication Sig Dispense Refill  . amiodarone (PACERONE) 200 MG tablet TAKE  (1)  TABLET  THREE TIMES DAILY.  90 tablet  1  . budesonide-formoterol (SYMBICORT) 160-4.5 MCG/ACT inhaler Inhale 2 puffs into the lungs 2 (two) times daily.  1 Inhaler  1  . calcium carbonate (TUMS - DOSED IN MG ELEMENTAL CALCIUM) 500 MG chewable tablet Chew 1 tablet by mouth 3 (three) times daily.      . Cyanocobalamin (VITAMIN B 12 PO) Take 500 mg by mouth. 1 tablet twice daily      . dutasteride (AVODART) 0.5 MG capsule Take 0.5 mg by mouth daily.      . feeding supplement (ENSURE) PUDG Take 1 Container by mouth daily.    0  . ferrous sulfate 325 (65 FE) MG tablet Take 1 tablet (325 mg total) by mouth 2 (two) times daily with a meal.  60 tablet  0  . FLORASTOR 250 MG capsule TAKE  (1)  CAPSULE  TWICE DAILY.  50 capsule  3  . furosemide (LASIX) 20 MG tablet Take 20 mg by mouth 2 (two) times daily.       [name] gabapentin (NEURONTIN) 600 MG tablet Take 600 mg by mouth 3 (three) times daily.      [name]  ipratropium (ATROVENT) 0.02 % nebulizer solution Take 2.5 mLs (0.5 mg total) by nebulization 4 (four) times daily.  300 mL  0  . levalbuterol (XOPENEX) 0.63 MG/3ML nebulizer solution Take 3 mLs (0.63 mg total) by nebulization 2 (two) times daily as needed for wheezing or shortness of breath. 1 vial in neb twice daily with Ipratropium.  May take extra neb every 4 hours if needed for wheezing/shortness of breath  180 mL  0  . metoprolol (LOPRESSOR) 50 MG tablet Take 50 mg by mouth 2 (two) times daily. 50 mg at lunch and 25 mg at bedtime      . mirtazapine (REMERON) 7.5 MG tablet Take 7.5 mg by mouth at bedtime.      . pantoprazole (PROTONIX) 40 MG tablet Take 40 mg by mouth 2 (two) times daily.      . potassium chloride SA (K-DUR,KLOR-CON) 20 MEQ tablet Take 1 tablet (20 mEq total) by mouth 2 (two) times daily.  30 tablet  5  . saccharomyces boulardii (FLORASTOR) 250 MG capsule Take  1 capsule (250 mg total) by mouth 2 (two) times daily.  60 capsule  2  . simvastatin (ZOCOR) 20 MG tablet TAKE ONE TABLET AT BEDTIME  30 tablet  11  . warfarin (COUMADIN) 1 MG tablet Take 1-2 mg by mouth as directed. Coumadin 1mg  on MWF, 2mg  other days      . warfarin (COUMADIN) 1 MG tablet TAKE 1 TO 2 TABLETS BY MOUTH AS DIRECTED  45 tablet  1   No current facility-administered medications for this visit.    Allergies  Allergen Reactions  . Penicillins     hives    History   Social History  . Marital Status: Married    Spouse Name: N/A    Number of Children: N/A  . Years of Education: N/A   Occupational History  . Not on file.   Social History Main Topics  . Smoking status: Former Smoker -- 1.00 packs/day for 20 years    Types: Cigarettes    Quit date: 06/13/1983  . Smokeless tobacco: Never Used  . Alcohol Use: No  . Drug Use: No  . Sexual Activity: No   Other Topics Concern  . Not on file   Social History Narrative   Lives in [place] with wife.  Several falls recently, 4 in the last month.        [name]  [phone]                Family History  Problem Relation Age of Onset  . Heart disease Mother   . Heart disease Father   . Prostate cancer Brother     ROS-  All systems are reviewed and are negative except as outlined in the HPI above  Physical Exam: Filed Vitals:   02/17/13 1455  BP: 129/55  Pulse: 48  Height: 5\' 11"  (1.803 m)  Weight: 158 lb 12.8 oz (72.031 kg)    GEN- The patient is elderly appearing, alert and oriented x 3 today.   Head- normocephalic, atraumatic Eyes-  Sclera clear, conjunctiva pink Ears- hearing intact Oropharynx- clear Neck- supple  Lungs- Clear to ausculation bilaterally, normal work of breathing Heart- irregular rate and rhythm, no murmurs, rubs or gallops, PMI not laterally displaced GI- soft, NT, ND, + BS Extremities- no clubbing, cyanosis, + edema MS- no significant deformity or atrophy Skin- no rash  or lesion Psych- euthymic mood, full affect Neuro- strength and sensation are intact  ekg  today reveals afib, V rate 48 bpm, Qtc 473  Assessment and Plan:  1. Permanent afib LA size is 68mm.  He will forever be in afib Stop amiodarone given concerns for amio toxicity with profound bradycardia Check LFTs and TFTs today Continue long term anticoagulation He will need close INR followup as his amiodarone washes out Decrease metorolol to 25mg  BID  2. Bradycardia Likely due to excessive amiodarone dosing Stop amiodarone Decrease metorolol to 25mg  BID Check TFTs today  3. HTN Stable No change required today  Return to follow-up with Brooke in 4 weeks Could stop metoprolol if still bradycardic at that time  I will see again in a year

## 2013-02-17 NOTE — Patient Instructions (Addendum)
Your physician recommends that you schedule a follow-up appointment in: 4 weeks with Sharrell Ku and 12 months with Dr Johney Frame  Your physician has recommended you make the following change in your medication:  1) Stop Amiodarone 2) Decrease Metoprolol to 25mg  twice daily-- 1/2 of a 50mg  tablet twice daily  Your physician recommends that you return for lab work today:liver panel/TSH/T4

## 2013-02-18 LAB — TSH: TSH: 2.03 u[IU]/mL (ref 0.35–5.50)

## 2013-02-19 ENCOUNTER — Encounter: Payer: Self-pay | Admitting: Internal Medicine

## 2013-02-19 ENCOUNTER — Ambulatory Visit (INDEPENDENT_AMBULATORY_CARE_PROVIDER_SITE_OTHER): Payer: Medicare Other | Admitting: General Practice

## 2013-02-19 ENCOUNTER — Ambulatory Visit (INDEPENDENT_AMBULATORY_CARE_PROVIDER_SITE_OTHER): Payer: Medicare Other | Admitting: Internal Medicine

## 2013-02-19 VITALS — BP 110/50 | HR 63 | Temp 97.1°F | Ht 71.0 in | Wt 158.4 lb

## 2013-02-19 DIAGNOSIS — J9610 Chronic respiratory failure, unspecified whether with hypoxia or hypercapnia: Secondary | ICD-10-CM

## 2013-02-19 DIAGNOSIS — J449 Chronic obstructive pulmonary disease, unspecified: Secondary | ICD-10-CM

## 2013-02-19 DIAGNOSIS — I4891 Unspecified atrial fibrillation: Secondary | ICD-10-CM

## 2013-02-19 DIAGNOSIS — R0602 Shortness of breath: Secondary | ICD-10-CM

## 2013-02-19 LAB — POCT INR: INR: 1.6

## 2013-02-19 NOTE — Patient Instructions (Addendum)
Only use your nebulizer if you can't catch your breath by resting or doing a relaxed purse lip breathing pattern. The less you use it, the better it will work when you need it. Ok to use up to every 4 hours or down to zero if not needed - if start needing it more than usual we need to see you right away   If you are satisfied with your treatment plan let your doctor know and he/she can either refill your medications or you can return here when your prescription runs out.     If in any way you are not 100% satisfied,  please tell us.  If 100% better, tell your friends!

## 2013-02-19 NOTE — Progress Notes (Signed)
Subjective:     Patient ID: [name], male   DOB: Sep 14, 1932, 80 y.o.   MRN: [id number]    Brief patient profile:  80 yowm  smoking cessation in the 1980s  With progressive doe x years. Reports on good day baseline can  ambulate about 100 ft before onset of significant dyspnea and usually limits his activity  . He presented to the ER on 05/06/2012 in acute distress. He was placed on BIPAP, PCCM asked to admit   Admit date: 05/06/2012  Discharge date: 05/15/2012  Discharge Diagnoses:  Purulent bronchitis/ aecopd ? How much acei effect > acei d/c'd on admit Atrial fibrillation with RVR  Acute respiratory failure  Dysphagia  HTN (hypertension)  H/O: CVA (cerebrovascular accident)  GERD (gastroesophageal reflux disease)  Acute posthemorrhagic anemia  Gastritis with hemorrhage  Stricture and stenosis of esophagus   05/22/2012 f/u ov/Wert cc about 50 % better since  Admit but leveling off in terms of activity tol and now on 2lpm, congested cough but no purulent sputum.  Has flutter not using, confused with meds/ changes. Lattie Haw helps some on prednisone taper finished on day of ov.    No purulent sputum. New leg swelling x sev days >>Cont Neb Four times a day  , mucinex /flutter , PPI Twice daily , Increase the diuril to 25 mg one daily  06/29/2012 one-month followup and medication review. Patient returns for a one-month followup and medication review. Patient brought all his medications, we reviewed them and organized them into a medication calendar with patient education. It appears the patient is taking his medications correctly Last visit was advised to increase diuretic.  CXR last ov showed bibasilar aspdz R>L .  Weight is down 9 lbs . No signiicant change in LE edema.  No significant change in dyspnea . Has DOE w/ 50- ft . Wears out easily.  No hemotpysis , chest pain , fever or orthopnea.  CAT 9 .  rec No change rx   Admit date: 07/20/2012  Discharge date: 07/29/2012   Recommendations for Outpatient Follow-up:  1. To SNF for ongoing physical therapy, OT, and rehabilitation  2. CBC and INR check on 2/28. Goal INR of 2-3.  3. Follow up cardiology within 1 month for amiodarone monitoring and dose titration 4. Follow up with primary care doctor within 1 week for repeat electrolytes, exam. Restart some home medications if able and titrate lasix as needed. Please follow up on final results of urine culture.  5. Last day of ciprofloxacin on 3/4.  6. Continue allevyn foam dressings to arm skin tears, change daily or sooner as needed.[name]  Discharge Diagnoses:  Principal Problem:  Atrial fibrillation with RVR  Active Problems:  HYPERLIPIDEMIA  PERIPHERAL NEUROPATHY  HYPERTENSION  CORONARY ARTERY DISEASE  CEREBROVASCULAR ACCIDENT  DYSPHAGIA UNSPECIFIED  GERD (gastroesophageal reflux disease)  COPD (chronic obstructive pulmonary disease)  Hypokalemia  Hypomagnesemia  Hypocalcemia  Acute kidney injury  Chronic diarrhea  Falls frequently  Leukocytosis  Normocytic anemia  Acute diastolic CHF (congestive heart failure)   09/03/2012 post hosp f/u ov/Wert cc  Chief Complaint  Patient presents with  . HFU    Pt states overall breathing is doing better since hospital d/c. He still has some DOE with minimal exertion.   cough no longer a problem, lies down ok, no 02  On symbicort 160 2bid plus prn neb still using at least a couple of times a day rec Plan A = automatic = Continue symbicort 160 Take  2 puffs first thing in am and then another 2 puffs about 12 hours later.  Work on inhaler technique:  Plan B = backup = Only use your albuterol (nebulizer)as a rescue medication to be used if you can't catch your breath by resting or doing a relaxed purse lip breathing pattern. The less you use it, the better it will work when you need it.  See Tammy NP w/in 2 weeks > not done    02/19/2013 f/u ov/Wert re: copd GOLD III Chief Complaint  Patient presents with  .  Follow-up    Breathing unchanged, occasional dry cough.    neb not helping much, did not use day of ov, confused with meds, names, how to use them. Still doe x 100 ft.  Cough is day > night  No obvious daytime variabilty or assoc purulent secretrions,   cp or chest tightness, subjective wheeze overt sinus or hb symptoms. No unusual exp hx or h/o childhood pna/ asthma or premature birth to his knowledge.   Sleeping ok without nocturnal  or early am exacerbation  of respiratory  c/o's or need for noct saba. Also denies any obvious fluctuation of symptoms with weather or environmental changes or other aggravating or alleviating factors except as outlined above   ROS  The following are not active complaints unless bolded sore throat, dysphagia, dental problems, itching, sneezing,  nasal congestion or excess/ purulent secretions, ear ache,   fever, chills, sweats, unintended wt loss, pleuritic or exertional cp, hemoptysis,  orthopnea pnd or leg swelling, presyncope, palpitations, heartburn, abdominal pain, anorexia, nausea, vomiting, diarrhea  or change in bowel or urinary habits, change in stools or urine, dysuria,hematuria,  rash, arthralgias, visual complaints, headache, numbness weakness or ataxia or problems with walking using rolling walker or coordination,  change in mood/affect or memory.                      Objective:   Physical Exam    frail elderly wm    Wt Readings from Last 3 Encounters:  02/19/13 158 lb 6.4 oz (71.85 kg)  02/17/13 158 lb 12.8 oz (72.031 kg)  02/05/13 157 lb (71.215 kg)         HEENT mild turbinate edema.  Oropharynx no thrush or excess pnd or cobblestoning.  No JVD or cervical adenopathy. Mild accessory muscle hypertrophy. Trachea midline, nl thryroid. Chest was hyperinflated by percussion with diminished breath sounds and moderate increased exp time without wheeze.  Regular rate and rhythm without murmur gallop or rub or increase P2 . Pos 1 + plus edema.   Abd: no hsm, nl excursion. Ext warm without cyanosis or clubbing.    cxr 07/23/12 Persistent bibasilar opacities which may reflect areas of  atelectasis and/or consolidation, likely with small bilateral  pleural effusions.  2. Cardiomegaly and marked dilatation of the central pulmonary  arteries (suggestive of pulmonary arterial hypertension)  redemonstrated.  3. Atherosclerosis.    CXR portable 11/16/12 1. The appearance of the chest suggests congestive heart failure,  as above.  2. Dilatation of pulmonic arteries suggestive of pulmonary  arterial hypertension.    Assessment:

## 2013-02-21 NOTE — Assessment & Plan Note (Signed)
-   placed on 02 at discharge 05/16/11 2lpm 24/7 - informed 09/14/2012 pt decided to stop 02 but encouraged to continue to use until next ov 09/21/12 - 02/20/2013   Walked RA x one lap @ 185 stopped due to  Sob but no desat/ legs gave out  No indication for 02 at this point

## 2013-02-21 NOTE — Assessment & Plan Note (Signed)
02/19/2013   Walked RA x one lap @ 185 stopped due to  Legs hurt, no desat  Would probably benefit the most from pulmonary rehab if he agrees to go  Pulmonary f/u is prn

## 2013-02-21 NOTE — Assessment & Plan Note (Signed)
-   spirometry 02/19/2013 1.15 (34%) ratio 53 s neb x > 12 h prior s/p smoking cessation 1985  Despite total inability to be sure what he's taking, he's actually doing well.   If he declines the first step is to return her for med rec.   To keep things simple, I have asked the patient to first separate medicines that are perceived as maintenance, that is to be taken daily "no matter what", from those medicines that are taken on only on an as-needed basis and I have given the patient examples of both, and then return to see our NP to generate a  detailed  medication calendar which should be followed until the next physician sees the patient and updates it.

## 2013-02-24 ENCOUNTER — Telehealth: Payer: Self-pay | Admitting: Internal Medicine

## 2013-02-24 NOTE — Telephone Encounter (Signed)
Reviewed lab results with patient's wife who verbalized understanding 

## 2013-02-24 NOTE — Telephone Encounter (Signed)
Follow Up:  Pt's wife states she is calling the nurse back to get test results.

## 2013-03-01 ENCOUNTER — Other Ambulatory Visit: Payer: Self-pay | Admitting: Family Medicine

## 2013-03-04 ENCOUNTER — Ambulatory Visit (INDEPENDENT_AMBULATORY_CARE_PROVIDER_SITE_OTHER): Payer: Medicare Other | Admitting: General Practice

## 2013-03-04 DIAGNOSIS — I4891 Unspecified atrial fibrillation: Secondary | ICD-10-CM

## 2013-03-04 DIAGNOSIS — Z7901 Long term (current) use of anticoagulants: Secondary | ICD-10-CM

## 2013-03-06 ENCOUNTER — Other Ambulatory Visit: Payer: Self-pay | Admitting: Family Medicine

## 2013-03-08 ENCOUNTER — Other Ambulatory Visit: Payer: Self-pay | Admitting: General Practice

## 2013-03-08 MED ORDER — WARFARIN SODIUM 1 MG PO TABS: ORAL_TABLET | ORAL | Status: DC

## 2013-03-09 ENCOUNTER — Encounter: Payer: Self-pay | Admitting: Internal Medicine

## 2013-03-12 ENCOUNTER — Encounter (INDEPENDENT_AMBULATORY_CARE_PROVIDER_SITE_OTHER): Payer: Medicare Other | Admitting: Ophthalmology

## 2013-03-12 DIAGNOSIS — H3530 Unspecified macular degeneration: Secondary | ICD-10-CM

## 2013-03-12 DIAGNOSIS — I10 Essential (primary) hypertension: Secondary | ICD-10-CM

## 2013-03-12 DIAGNOSIS — H35039 Hypertensive retinopathy, unspecified eye: Secondary | ICD-10-CM

## 2013-03-12 DIAGNOSIS — H43819 Vitreous degeneration, unspecified eye: Secondary | ICD-10-CM

## 2013-03-23 ENCOUNTER — Ambulatory Visit: Payer: Medicare Other | Admitting: Cardiology

## 2013-03-30 ENCOUNTER — Encounter: Payer: Self-pay | Admitting: Cardiology

## 2013-03-30 ENCOUNTER — Ambulatory Visit (INDEPENDENT_AMBULATORY_CARE_PROVIDER_SITE_OTHER): Payer: Medicare Other | Admitting: Cardiology

## 2013-03-30 VITALS — BP 120/68 | HR 46 | Ht 71.0 in | Wt 160.0 lb

## 2013-03-30 DIAGNOSIS — I4891 Unspecified atrial fibrillation: Secondary | ICD-10-CM

## 2013-03-30 DIAGNOSIS — R001 Bradycardia, unspecified: Secondary | ICD-10-CM

## 2013-03-30 DIAGNOSIS — I498 Other specified cardiac arrhythmias: Secondary | ICD-10-CM

## 2013-03-30 NOTE — Progress Notes (Signed)
ELECTROPHYSIOLOGY OFFICE NOTE  Patient ID: [name] MRN: [id number], DOB/AGE: Feb 27, 1933   Date of Visit: 03/30/2013  Primary Physician: Kristian Covey, MD Primary Cardiologist: Johney Frame, MD Reason for Visit: 4-week follow-up for AFib and bradycardia  History of Present Illness  [name] is a 79 y.o. male with permanent AFib, HTN and COPD who presents today for 2-week electrophysiology followup. He was evaluated by Dr. Johney Frame 4 weeks ago at which time he was found to be fatigued and bradycardic due to excessive amiodarone dosing (was taking 600 mg daily since Feb 2014). Amiodarone was discontinued and metoprolol dose decreased. LFTs and TFTs were within normal range. He presents today for follow-up. He is accompanied by his wife.  Since last being seen in our clinic, he reports he is doing better and has no new complaints. His fatigue is improving. He denies chest pain or shortness of breath. He denies palpitations, dizziness, near syncope or syncope. He ambulates with walker and denies falls. He denies LE swelling, orthopnea or PND. He is compliant with medications.  Past Medical History Past Medical History  Diagnosis Date  . History of upper gastrointestinal hemorrhage     dr. Dickie La, GI  . Cerebrovascular accident 27  . Gastric polyp   . Dysphagia, unspecified(787.20)   . Coronary artery disease     a. Nonobstructive by cath 2009.  [name] Permanent atrial fibrillation     INR followed at [facility].  [name] Hypertension   . Esophagitis   . GERD (gastroesophageal reflux disease)   . Erosive gastritis     H/o GIB felt secondary to this  . Gastric ulcer   . Hiatal hernia   . Internal hemorrhoids   . Hearing impairment   . Vision problems   . Chronic bronchitis     a. h/o resp failure in setting of bronchitis vs PNA 05/2012.  [name] Hyperlipidemia   . ASD (atrial septal defect)     a. s/p repair 1983.  [name] BPH (benign prostatic hyperplasia)   . COPD (chronic obstructive pulmonary  disease)   . Anemia   . Depression   . LV dysfunction     a. EF 45-50% by echo 05/2012.  [name] Chronic diarrhea     Past Surgical History Past Surgical History  Procedure Laterality Date  . Cholecystectomy  1990s  . Asd repair  [id number]  . Inguinal hernia repair  1963  . Partial small bowel resection  2005    ischemic?  [name] Enteroscopy  05/13/2012    Procedure: ENTEROSCOPY;  Surgeon: Louis Meckel, MD;  Location: [facility];  Service: Endoscopy;  Laterality: N/A;  . Cardioversion      possibly 3 times, dr. brody/cooper    Allergies/Intolerances Allergies  Allergen Reactions  . Penicillins     hives    Current Home Medications Current Outpatient Prescriptions  Medication Sig Dispense Refill  . calcium carbonate (TUMS - DOSED IN MG ELEMENTAL CALCIUM) 500 MG chewable tablet Chew 1 tablet by mouth 3 (three) times daily.      . Cyanocobalamin (VITAMIN B 12 PO) Take 500 mg by mouth. 1 tablet twice daily      . dutasteride (AVODART) 0.5 MG capsule Take 0.5 mg by mouth daily.      . feeding supplement (ENSURE) PUDG Take 1 Container by mouth daily.    0  . ferrous sulfate 325 (65 FE) MG tablet Take 1 tablet (325 mg total) by mouth 2 (two) times daily with a meal.  60 tablet  0  . FLORASTOR 250 MG capsule TAKE  (1)  CAPSULE  TWICE DAILY.  50 capsule  3  . furosemide (LASIX) 20 MG tablet Take 20 mg by mouth 2 (two) times daily.       [name] gabapentin (NEURONTIN) 600 MG tablet TAKE  (1)  TABLET  THREE TIMES DAILY.  90 tablet  3  . ipratropium (ATROVENT) 0.02 % nebulizer solution Take 2.5 mLs (0.5 mg total) by nebulization 4 (four) times daily.  300 mL  0  . levalbuterol (XOPENEX) 0.63 MG/3ML nebulizer solution Take 3 mLs (0.63 mg total) by nebulization 2 (two) times daily as needed for wheezing or shortness of breath. 1 vial in neb twice daily with Ipratropium.  May take extra neb every 4 hours if needed for wheezing/shortness of breath  180 mL  0  . mirtazapine (REMERON) 7.5 MG tablet Take 7.5 mg  by mouth at bedtime.      . Multiple Vitamins-Minerals (OCUVITE PRESERVISION PO) Take by mouth 2 (two) times daily.      . pantoprazole (PROTONIX) 40 MG tablet Take 40 mg by mouth 2 (two) times daily.      . potassium chloride SA (K-DUR,KLOR-CON) 20 MEQ tablet Take 1 tablet (20 mEq total) by mouth 2 (two) times daily.  30 tablet  5  . simvastatin (ZOCOR) 20 MG tablet TAKE ONE TABLET AT BEDTIME  30 tablet  11  . warfarin (COUMADIN) 1 MG tablet Please take as directed by anticoagulation clinic  50 tablet  2   No current facility-administered medications for this visit.    Social History Social History  . Marital Status: Married   Social History Main Topics  . Smoking status: Former Smoker -- 1.00 packs/day for 20 years    Types: Cigarettes    Quit date: 06/13/1983  . Smokeless tobacco: Never Used  . Alcohol Use: No  . Drug Use: No   Review of Systems General: No chills, fever, night sweats or weight changes Cardiovascular: No chest pain, dyspnea on exertion, edema, orthopnea, palpitations, paroxysmal nocturnal dyspnea Dermatological: No rash, lesions or masses Respiratory: No cough, dyspnea Urologic: No hematuria, dysuria Abdominal: No nausea, vomiting, diarrhea, bright red blood per rectum, melena, or hematemesis Neurologic: No visual changes, weakness, changes in mental status All other systems reviewed and are otherwise negative except as noted above.  Physical Exam Vitals: Blood pressure 120/68, pulse 46, height 5\' 11"  (1.803 m), weight 160 lb (72.576 kg).  General: Well developed, well appearing 79 y.o. male in no acute distress. HEENT: Normocephalic, atraumatic. EOMs intact. Sclera nonicteric. Oropharynx clear.  Neck: Supple. No JVD. Lungs: Respirations regular and unlabored, CTA bilaterally. No wheezes, rales or rhonchi. Heart: Irregular. S1, S2 present. No murmurs, rub, S3 or S4. Abdomen: Soft, non-distended.  Extremities: No clubbing or cyanosis. Trace pedal edema at  ankles bilaterally. PT/Radials 2+ and equal bilaterally. Psych: Normal affect. Neuro: Alert and oriented X 3. Moves all extremities spontaneously.   Diagnostics 12-lead ECG today - AFib with slow ventricular response at 46 bpm with RBBB; QTc 440 msec  Assessment and Plan 1. Permanent AFib 2. Bradycardia  Mr. Perrow presents with persistent fatigue although improved since discontinuing amiodarone and decreasing metoprolol dose. He remains bradycardic today so will discontinue metoprolol altogether. He will continue anticoagulation with warfarin for stroke risk reduction. He will return for follow-up in one month.  Signed, Rick Duff, PA-C 03/30/2013, 11:46 AM

## 2013-03-30 NOTE — Patient Instructions (Signed)
Your physician has recommended you make the following change in your medication:   STOP YOUR METOPROLOL TODAY  Your physician recommends that you schedule a follow-up appointment in: 1 MONTH WITH BROOKE EDMISTEN  Your physician recommends that you continue on your current medications as directed. Please refer to the Current Medication list given to you today.

## 2013-04-01 ENCOUNTER — Ambulatory Visit (INDEPENDENT_AMBULATORY_CARE_PROVIDER_SITE_OTHER): Payer: Medicare Other | Admitting: General Practice

## 2013-04-01 DIAGNOSIS — Z7901 Long term (current) use of anticoagulants: Secondary | ICD-10-CM

## 2013-04-01 DIAGNOSIS — I4891 Unspecified atrial fibrillation: Secondary | ICD-10-CM

## 2013-04-01 LAB — POCT INR: INR: 2.1

## 2013-05-04 ENCOUNTER — Encounter: Payer: Self-pay | Admitting: Cardiology

## 2013-05-04 ENCOUNTER — Ambulatory Visit (INDEPENDENT_AMBULATORY_CARE_PROVIDER_SITE_OTHER): Payer: Medicare Other | Admitting: *Deleted

## 2013-05-04 ENCOUNTER — Ambulatory Visit (INDEPENDENT_AMBULATORY_CARE_PROVIDER_SITE_OTHER): Payer: Medicare Other | Admitting: Cardiology

## 2013-05-04 VITALS — BP 120/68 | HR 85 | Ht 71.0 in | Wt 168.1 lb

## 2013-05-04 DIAGNOSIS — R001 Bradycardia, unspecified: Secondary | ICD-10-CM

## 2013-05-04 DIAGNOSIS — I4891 Unspecified atrial fibrillation: Secondary | ICD-10-CM

## 2013-05-04 DIAGNOSIS — I498 Other specified cardiac arrhythmias: Secondary | ICD-10-CM

## 2013-05-04 LAB — POCT INR: INR: 1.5

## 2013-05-04 NOTE — Patient Instructions (Signed)
Your physician wants you to follow-up in: 6 months with Allred You will receive a reminder letter in the mail two months in advance. If you don't receive a letter, please call our office to schedule the follow-up appointment.

## 2013-05-06 ENCOUNTER — Ambulatory Visit: Payer: Medicare Other

## 2013-05-09 NOTE — Progress Notes (Signed)
ELECTROPHYSIOLOGY OFFICE NOTE  Patient ID: [name] MRN: [id number], DOB/AGE: Oct 26, 1932   Date of Visit: 05/04/2013  Primary Physician: Kristian Covey, MD Primary Cardiologist: Johney Frame, MD Reason for Visit: 4-week follow-up for AFib and bradycardia  History of Present Illness  [name] is a 79 y.o. male with permanent AFib, HTN and COPD who presents today for 4-week electrophysiology followup. He was evaluated by Dr. Johney Frame a few weeks ago at which time he reported fatigue and was bradycardic due to excessive amiodarone dosing (was taking 600 mg daily since Feb 2014). Amiodarone was discontinued and metoprolol dose decreased. LFTs and TFTs were within normal range. He presented for follow-up 4 weeks ago and he was still bradycardic. His metoprolol was discontinued altogether. He presents today for follow-up. He is accompanied by his wife.  Since last being seen in our clinic, he reports he is doing better and has no new complaints. His fatigue is improved but his wife is concerned he still has no interest in activities. He denies chest pain or shortness of breath. He denies palpitations, dizziness, near syncope or syncope. He ambulates with walker and denies falls. He denies LE swelling, orthopnea or PND. He is compliant with medications.  Past Medical History Past Medical History  Diagnosis Date  . History of upper gastrointestinal hemorrhage     dr. Dickie La, GI  . Cerebrovascular accident 79  . Gastric polyp   . Dysphagia, unspecified(787.20)   . Coronary artery disease     a. Nonobstructive by cath 2009.  [name] Permanent atrial fibrillation     INR followed at [facility].  [name] Hypertension   . Esophagitis   . GERD (gastroesophageal reflux disease)   . Erosive gastritis     H/o GIB felt secondary to this  . Gastric ulcer   . Hiatal hernia   . Internal hemorrhoids   . Hearing impairment   . Vision problems   . Chronic bronchitis     a. h/o resp failure in setting of  bronchitis vs PNA 05/2012.  [name] Hyperlipidemia   . ASD (atrial septal defect)     a. s/p repair 1983.  [name] BPH (benign prostatic hyperplasia)   . COPD (chronic obstructive pulmonary disease)   . Anemia   . Depression   . LV dysfunction     a. EF 45-50% by echo 05/2012.  [name] Chronic diarrhea     Past Surgical History Past Surgical History  Procedure Laterality Date  . Cholecystectomy  1990s  . Asd repair  [id number]  . Inguinal hernia repair  1963  . Partial small bowel resection  2005    ischemic?  [name] Enteroscopy  05/13/2012    Procedure: ENTEROSCOPY;  Surgeon: Louis Meckel, MD;  Location: [facility];  Service: Endoscopy;  Laterality: N/A;  . Cardioversion      possibly 3 times, dr. brody/cooper    Allergies/Intolerances Allergies  Allergen Reactions  . Penicillins     hives    Current Home Medications Current Outpatient Prescriptions  Medication Sig Dispense Refill  . calcium carbonate (TUMS - DOSED IN MG ELEMENTAL CALCIUM) 500 MG chewable tablet Chew 1 tablet by mouth 3 (three) times daily.      . Cyanocobalamin (VITAMIN B 12 PO) Take 500 mg by mouth. 1 tablet twice daily      . dutasteride (AVODART) 0.5 MG capsule Take 0.5 mg by mouth daily.      . feeding supplement (ENSURE) PUDG Take 1 Container by mouth daily.  0  . ferrous sulfate 325 (65 FE) MG tablet Take 1 tablet (325 mg total) by mouth 2 (two) times daily with a meal.  60 tablet  0  . FLORASTOR 250 MG capsule TAKE  (1)  CAPSULE  TWICE DAILY.  50 capsule  3  . furosemide (LASIX) 20 MG tablet Take 20 mg by mouth 2 (two) times daily.       [name] gabapentin (NEURONTIN) 600 MG tablet TAKE  (1)  TABLET  THREE TIMES DAILY.  90 tablet  3  . ipratropium (ATROVENT) 0.02 % nebulizer solution Take 2.5 mLs (0.5 mg total) by nebulization 4 (four) times daily.  300 mL  0  . levalbuterol (XOPENEX) 0.63 MG/3ML nebulizer solution Take 3 mLs (0.63 mg total) by nebulization 2 (two) times daily as needed for wheezing or shortness of breath. 1  vial in neb twice daily with Ipratropium.  May take extra neb every 4 hours if needed for wheezing/shortness of breath  180 mL  0  . mirtazapine (REMERON) 7.5 MG tablet Take 7.5 mg by mouth at bedtime.      . Multiple Vitamins-Minerals (OCUVITE PRESERVISION PO) Take by mouth 2 (two) times daily.      . pantoprazole (PROTONIX) 40 MG tablet Take 40 mg by mouth 2 (two) times daily.      . potassium chloride SA (K-DUR,KLOR-CON) 20 MEQ tablet Take 1 tablet (20 mEq total) by mouth 2 (two) times daily.  30 tablet  5  . simvastatin (ZOCOR) 20 MG tablet TAKE ONE TABLET AT BEDTIME  30 tablet  11  . warfarin (COUMADIN) 1 MG tablet Please take as directed by anticoagulation clinic  50 tablet  2   No current facility-administered medications for this visit.    Social History Social History  . Marital Status: Married   Social History Main Topics  . Smoking status: Former Smoker -- 1.00 packs/day for 20 years    Types: Cigarettes    Quit date: 06/13/1983  . Smokeless tobacco: Never Used  . Alcohol Use: No  . Drug Use: No   Review of Systems General: No chills, fever, night sweats or weight changes Cardiovascular: No chest pain, dyspnea on exertion, edema, orthopnea, palpitations, paroxysmal nocturnal dyspnea Dermatological: No rash, lesions or masses Respiratory: No cough, dyspnea Urologic: No hematuria, dysuria Abdominal: No nausea, vomiting, diarrhea, bright red blood per rectum, melena, or hematemesis Neurologic: No visual changes, weakness, changes in mental status All other systems reviewed and are otherwise negative except as noted above.  Physical Exam Vitals: Blood pressure 120/68, pulse 85, height 5\' 11"  (1.803 m), weight 168 lb 1.9 oz (76.259 kg).  General: Well developed, well appearing 79 y.o. male in no acute distress. HEENT: Normocephalic, atraumatic. EOMs intact. Sclera nonicteric. Oropharynx clear.  Neck: Supple. No JVD. Lungs: Respirations regular and unlabored, CTA  bilaterally. No wheezes, rales or rhonchi. Heart: RRR. S1, S2 present. No murmurs, rub, S3 or S4. Abdomen: Soft, non-distended.  Extremities: No clubbing, cyanosis or edema. PT/Radials 2+ and equal bilaterally. Psych: Normal affect. Neuro: Alert and oriented X 3. Moves all extremities spontaneously.   Diagnostics 12-lead ECG today - AFib at 72 bpm with RBBB; QRS 124; QTc 460    Assessment and Plan  1. Permanent AFib  2. Bradycardia, resolved   Mr. Bache will remain off amiodarone and metoprolol. His rate has improved. He will continue anticoagulation with warfarin for stroke risk reduction. He will return for follow-up with Dr. Johney Frame in 4 months.  Signed, Rick Duff, PA-C 05/09/2013, 9:19 PM

## 2013-05-17 ENCOUNTER — Ambulatory Visit: Payer: Medicare Other

## 2013-05-20 ENCOUNTER — Ambulatory Visit (INDEPENDENT_AMBULATORY_CARE_PROVIDER_SITE_OTHER): Payer: Medicare Other | Admitting: General Practice

## 2013-05-20 DIAGNOSIS — I4891 Unspecified atrial fibrillation: Secondary | ICD-10-CM

## 2013-05-20 LAB — POCT INR: INR: 1.4

## 2013-05-20 NOTE — Progress Notes (Signed)
Pre-visit discussion using our clinic review tool. No additional management support is needed unless otherwise documented below in the visit note.  

## 2013-05-31 ENCOUNTER — Other Ambulatory Visit: Payer: Self-pay | Admitting: Family Medicine

## 2013-06-07 ENCOUNTER — Ambulatory Visit (INDEPENDENT_AMBULATORY_CARE_PROVIDER_SITE_OTHER): Payer: Medicare Other | Admitting: General Practice

## 2013-06-07 DIAGNOSIS — I4891 Unspecified atrial fibrillation: Secondary | ICD-10-CM

## 2013-06-07 LAB — POCT INR: INR: 1.5

## 2013-06-07 NOTE — Progress Notes (Signed)
Pre-visit discussion using our clinic review tool. No additional management support is needed unless otherwise documented below in the visit note.  

## 2013-06-21 ENCOUNTER — Ambulatory Visit (INDEPENDENT_AMBULATORY_CARE_PROVIDER_SITE_OTHER): Payer: Medicare Other | Admitting: General Practice

## 2013-06-21 DIAGNOSIS — I4891 Unspecified atrial fibrillation: Secondary | ICD-10-CM

## 2013-06-21 LAB — POCT INR: INR: 1.9

## 2013-06-21 NOTE — Progress Notes (Signed)
Pre-visit discussion using our clinic review tool. No additional management support is needed unless otherwise documented below in the visit note.  

## 2013-07-05 ENCOUNTER — Other Ambulatory Visit: Payer: Self-pay | Admitting: Family Medicine

## 2013-07-19 ENCOUNTER — Ambulatory Visit: Payer: Medicare Other

## 2013-07-24 ENCOUNTER — Other Ambulatory Visit: Payer: Self-pay | Admitting: Family Medicine

## 2013-07-26 ENCOUNTER — Ambulatory Visit (INDEPENDENT_AMBULATORY_CARE_PROVIDER_SITE_OTHER): Payer: Medicare Other | Admitting: General Practice

## 2013-07-26 ENCOUNTER — Other Ambulatory Visit: Payer: Self-pay | Admitting: Family Medicine

## 2013-07-26 DIAGNOSIS — I4891 Unspecified atrial fibrillation: Secondary | ICD-10-CM

## 2013-07-26 DIAGNOSIS — Z5181 Encounter for therapeutic drug level monitoring: Secondary | ICD-10-CM | POA: Insufficient documentation

## 2013-07-26 LAB — POCT INR: INR: 1.8

## 2013-07-26 NOTE — Progress Notes (Signed)
Pre visit review using our clinic review tool, if applicable. No additional management support is needed unless otherwise documented below in the visit note. 

## 2013-07-30 ENCOUNTER — Encounter: Payer: Self-pay | Admitting: Family Medicine

## 2013-07-30 ENCOUNTER — Ambulatory Visit (INDEPENDENT_AMBULATORY_CARE_PROVIDER_SITE_OTHER): Payer: Medicare Other | Admitting: Family Medicine

## 2013-07-30 VITALS — BP 130/66 | HR 79 | Temp 97.5°F | Wt 177.0 lb

## 2013-07-30 DIAGNOSIS — I4891 Unspecified atrial fibrillation: Secondary | ICD-10-CM

## 2013-07-30 DIAGNOSIS — I10 Essential (primary) hypertension: Secondary | ICD-10-CM

## 2013-07-30 DIAGNOSIS — D649 Anemia, unspecified: Secondary | ICD-10-CM

## 2013-07-30 DIAGNOSIS — E538 Deficiency of other specified B group vitamins: Secondary | ICD-10-CM

## 2013-07-30 DIAGNOSIS — E785 Hyperlipidemia, unspecified: Secondary | ICD-10-CM

## 2013-07-30 LAB — BASIC METABOLIC PANEL
BUN: 16 mg/dL (ref 6–23)
CHLORIDE: 103 meq/L (ref 96–112)
CO2: 32 meq/L (ref 19–32)
Calcium: 8.8 mg/dL (ref 8.4–10.5)
Creatinine, Ser: 1.4 mg/dL (ref 0.4–1.5)
GFR: 52.81 mL/min — ABNORMAL LOW (ref >60.00)
Glucose, Bld: 108 mg/dL — ABNORMAL HIGH (ref 70–99)
POTASSIUM: 3.4 meq/L — AB (ref 3.5–5.1)
SODIUM: 139 meq/L (ref 135–145)

## 2013-07-30 LAB — CBC WITH DIFFERENTIAL/PLATELET
Basophils Absolute: 0 10*3/uL (ref 0.0–0.1)
Basophils Relative: 1 % (ref 0.0–3.0)
Eosinophils Absolute: 0.2 10*3/uL (ref 0.0–0.7)
Eosinophils Relative: 3.5 % (ref 0.0–5.0)
HCT: 35.3 % — ABNORMAL LOW (ref 39.0–52.0)
Hemoglobin: 11.6 g/dL — ABNORMAL LOW (ref 13.0–17.0)
Lymphocytes Relative: 24.9 % (ref 12.0–46.0)
Lymphs Abs: 1.3 10*3/uL (ref 0.7–4.0)
MCHC: 32.7 g/dL (ref 30.0–36.0)
MCV: 90.8 fl (ref 78.0–100.0)
MONOS PCT: 11 % (ref 3.0–12.0)
Monocytes Absolute: 0.6 10*3/uL (ref 0.1–1.0)
NEUTROS PCT: 59.6 % (ref 43.0–77.0)
Neutro Abs: 3.1 10*3/uL (ref 1.4–7.7)
PLATELETS: 161 10*3/uL (ref 150.0–400.0)
RBC: 3.89 Mil/uL — ABNORMAL LOW (ref 4.22–5.81)
RDW: 15 % — ABNORMAL HIGH (ref 11.5–14.6)
WBC: 5.1 10*3/uL (ref 4.5–10.5)

## 2013-07-30 LAB — LIPID PANEL
CHOLESTEROL: 117 mg/dL (ref 0–200)
HDL: 36.5 mg/dL — ABNORMAL LOW (ref >39.00)
TRIGLYCERIDES: 303 mg/dL — AB (ref 0.0–149.0)
Total CHOL/HDL Ratio: 3
VLDL: 60.6 mg/dL — ABNORMAL HIGH (ref 0.0–40.0)

## 2013-07-30 LAB — HEPATIC FUNCTION PANEL
ALBUMIN: 3.3 g/dL — AB (ref 3.5–5.2)
ALT: 14 U/L (ref 0–53)
AST: 24 U/L (ref 0–37)
Alkaline Phosphatase: 61 U/L (ref 39–117)
Bilirubin, Direct: 0.1 mg/dL (ref 0.0–0.3)
Total Bilirubin: 0.8 mg/dL (ref 0.3–1.2)
Total Protein: 6.8 g/dL (ref 6.0–8.3)

## 2013-07-30 LAB — VITAMIN B12: VITAMIN B 12: 707 pg/mL (ref 211–911)

## 2013-07-30 LAB — LDL CHOLESTEROL, DIRECT: LDL DIRECT: 48.8 mg/dL

## 2013-07-30 NOTE — Progress Notes (Signed)
Subjective:    Patient ID: [name], male    DOB: November 15, 1932, 80 y.o.   MRN: [id number]  HPI Medical followup. Patient has a complicated medical history. History of CAD cerebrovascular disease, diastolic heart failure, history of systolic heart failure, hypertension, atrial fibrillation, hyperlipidemia, COPD, chronic kidney disease, chronic normocytic anemia. He had failure to thrive last year and significant weight loss which has stabilized but he had tremendous improvement over recent months. His weight is up from 159 pounds last visit here to 177 pounds currently. No peripheral edema issues. No dyspnea.  Remains on Coumadin for atrial fibrillation. No bleeding complications. He has history of B12 deficiency and takes oral supplement regularly. Last B12 level 285. He has not had lipids checked in over a year. Medications are reviewed. No chest pains. No dizziness. No falls.  Past Medical History  Diagnosis Date  . History of upper gastrointestinal hemorrhage     dr. Maurene Capes, GI  . Cerebrovascular accident 48  . Gastric polyp   . Dysphagia, unspecified(787.20)   . Coronary artery disease     a. Nonobstructive by cath 2009.  [name] Permanent atrial fibrillation     INR followed at [facility].  [name] Hypertension   . Esophagitis   . GERD (gastroesophageal reflux disease)   . Erosive gastritis     H/o GIB felt secondary to this  . Gastric ulcer   . Hiatal hernia   . Internal hemorrhoids   . Hearing impairment   . Vision problems   . Chronic bronchitis     a. h/o resp failure in setting of bronchitis vs PNA 05/2012.  [name] Hyperlipidemia   . ASD (atrial septal defect)     a. s/p repair 1983.  [name] BPH (benign prostatic hyperplasia)   . COPD (chronic obstructive pulmonary disease)   . Anemia   . Depression   . LV dysfunction     a. EF 45-50% by echo 05/2012.  [name] Chronic diarrhea    Past Surgical History  Procedure Laterality Date  . Cholecystectomy  1990s  . Asd repair  [id number]  . Inguinal  hernia repair  1963  . Partial small bowel resection  2005    ischemic?  [name] Enteroscopy  05/13/2012    Procedure: ENTEROSCOPY;  Surgeon: Inda Castle, MD;  Location: [facility];  Service: Endoscopy;  Laterality: N/A;  . Cardioversion      possibly 3 times, dr. brody/cooper    reports that he quit smoking about 30 years ago. His smoking use included Cigarettes. He has a 20 pack-year smoking history. He has never used smokeless tobacco. He reports that he does not drink alcohol or use illicit drugs. family history includes Heart disease in his father and mother; Prostate cancer in his brother. Allergies  Allergen Reactions  . Penicillins     hives      Review of Systems  Constitutional: Negative for fever, chills, appetite change, fatigue and unexpected weight change.  Respiratory: Negative for cough and shortness of breath.   Cardiovascular: Negative for chest pain, palpitations and leg swelling.  Gastrointestinal: Negative for nausea, vomiting, abdominal pain and blood in stool.  Endocrine: Negative for polydipsia and polyuria.  Genitourinary: Negative for dysuria.  Neurological: Negative for dizziness and syncope.  Psychiatric/Behavioral: Negative for confusion.       Objective:   Physical Exam  Constitutional: He appears well-developed and well-nourished.  HENT:  Mouth/Throat: Oropharynx is clear and moist.  Neck: Neck supple. No thyromegaly present.  Cardiovascular:  Normal rate.   Irregular rhythm  Pulmonary/Chest: Effort normal and breath sounds normal. No respiratory distress. He has no wheezes. He has no rales.  Musculoskeletal: He exhibits no edema.          Assessment & Plan:  #1 history of failure to thrive. He has improved dramatically since last visit with improvement in appetite and weight. He's had great improvement in strength and no recent fall. He has history of low albumin we'll recheck hepatic panel today #2 hyperlipidemia. Check lipid and hepatic  panel #3 B12 deficiency history. Currently on oral placement. Recheck B12 levels. #4 history of chronic normocytic anemia. Recheck CBC #5 chronic atrial fibrillation.  Rate controlled.  Continue with coumadin clinic.

## 2013-07-30 NOTE — Progress Notes (Signed)
Pre visit review using our clinic review tool, if applicable. No additional management support is needed unless otherwise documented below in the visit note. 

## 2013-08-02 ENCOUNTER — Telehealth: Payer: Self-pay | Admitting: Family Medicine

## 2013-08-02 NOTE — Telephone Encounter (Signed)
Relevant patient education mailed to patient.  

## 2013-08-11 ENCOUNTER — Other Ambulatory Visit: Payer: Self-pay | Admitting: Family Medicine

## 2013-08-11 ENCOUNTER — Other Ambulatory Visit: Payer: Self-pay | Admitting: General Practice

## 2013-08-11 MED ORDER — WARFARIN SODIUM 1 MG PO TABS: ORAL_TABLET | ORAL | Status: DC

## 2013-08-17 IMAGING — CR DG RIBS W/ CHEST 3+V*R*
5 series · 5 of 5 positions shown · non-contrast
Comparison: Chest x-ray 05/09/2012.

CLINICAL DATA: Weakness and hypotension.  Right-sided pain.
Shortness of breath.

RIGHT RIBS AND CHEST - 3+ VIEW

[t chest supine]
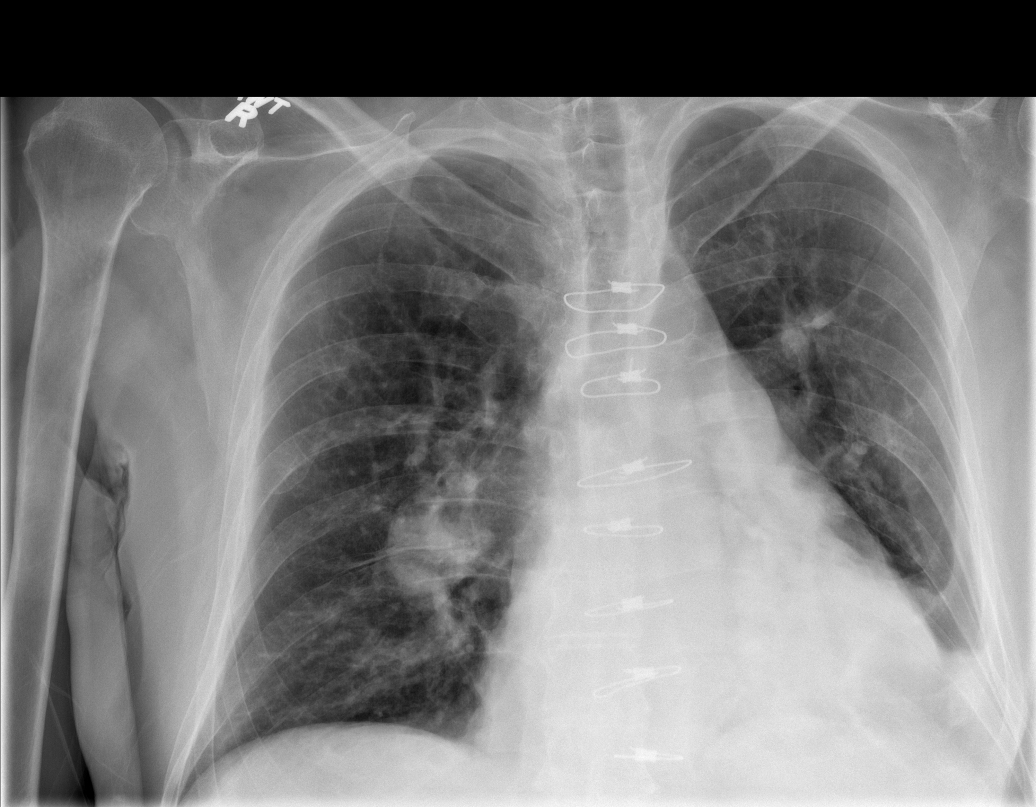

[t ribs ap upper right]
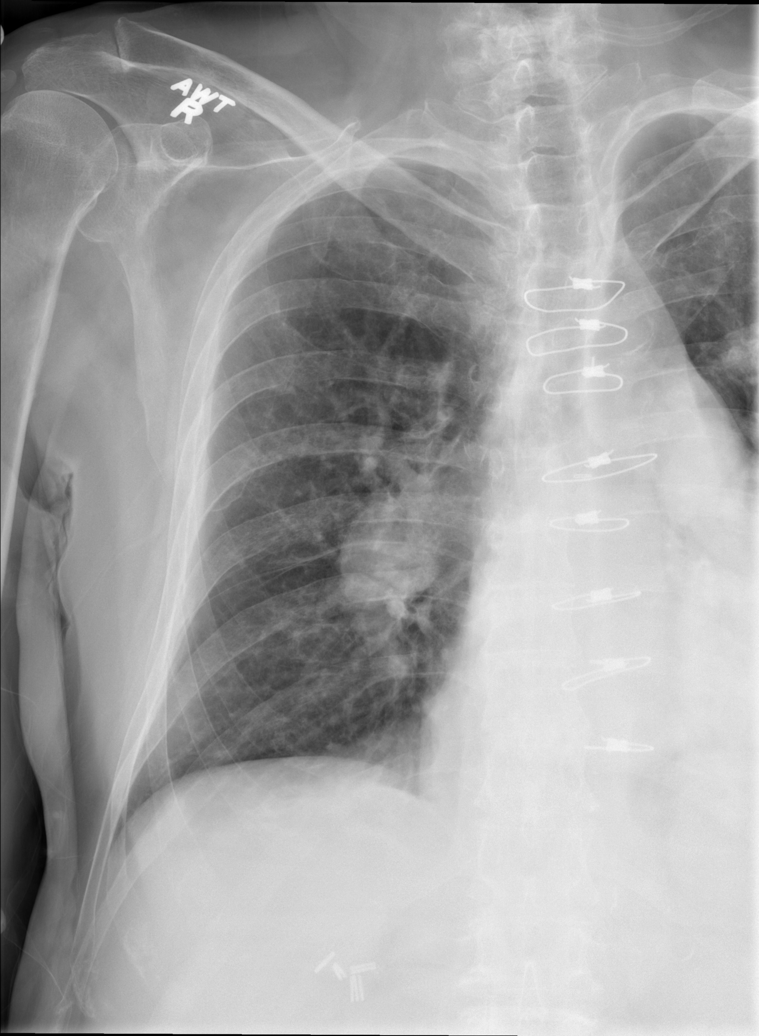

[t ribs rpo right (1 of 3)]
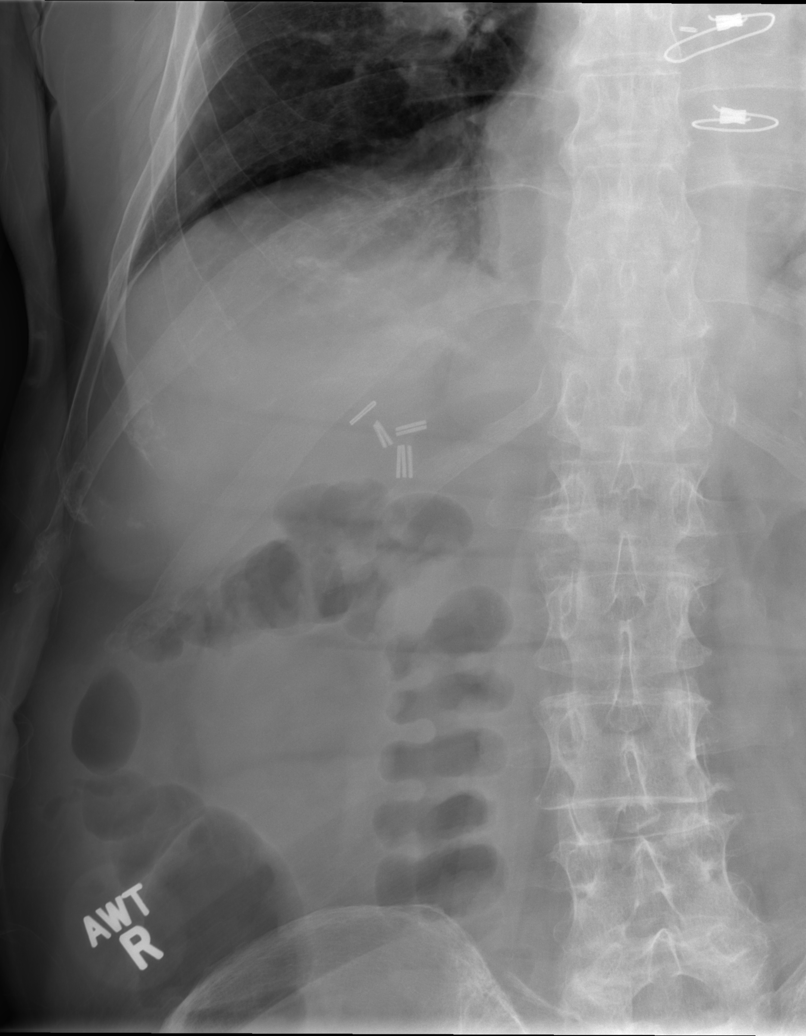

[t ribs rpo right (2 of 3)]
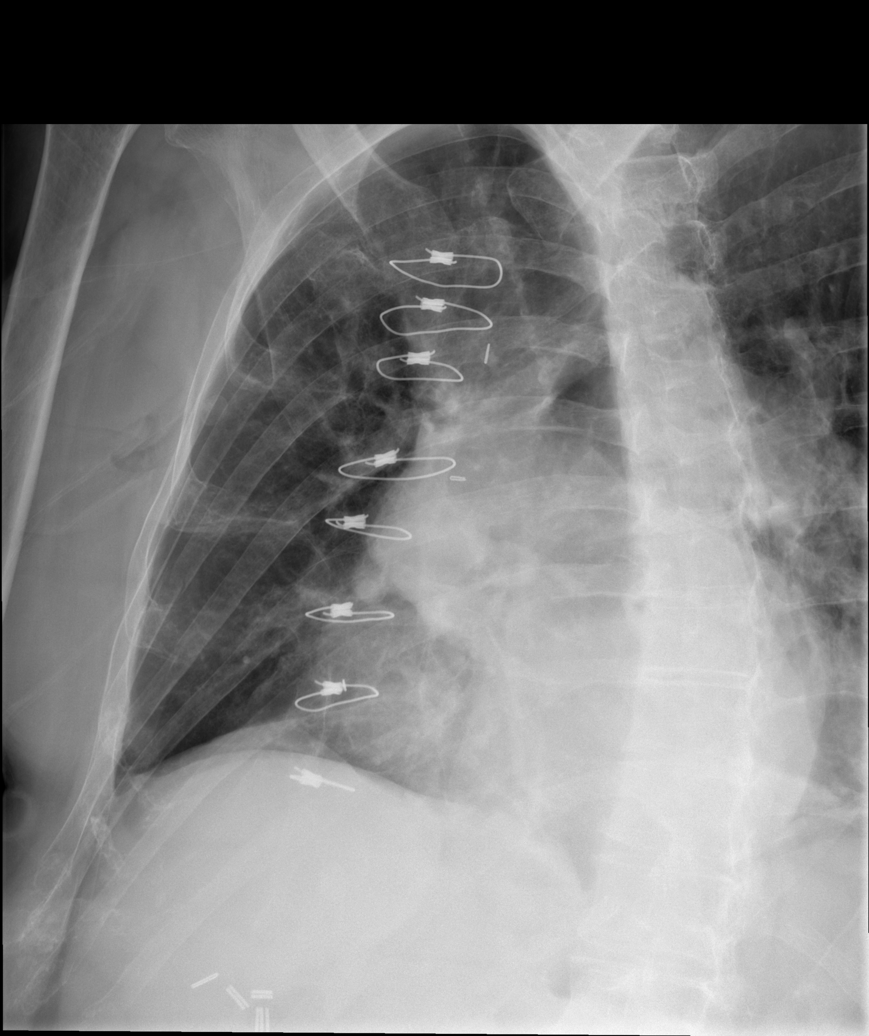

[t ribs rpo right (3 of 3)]
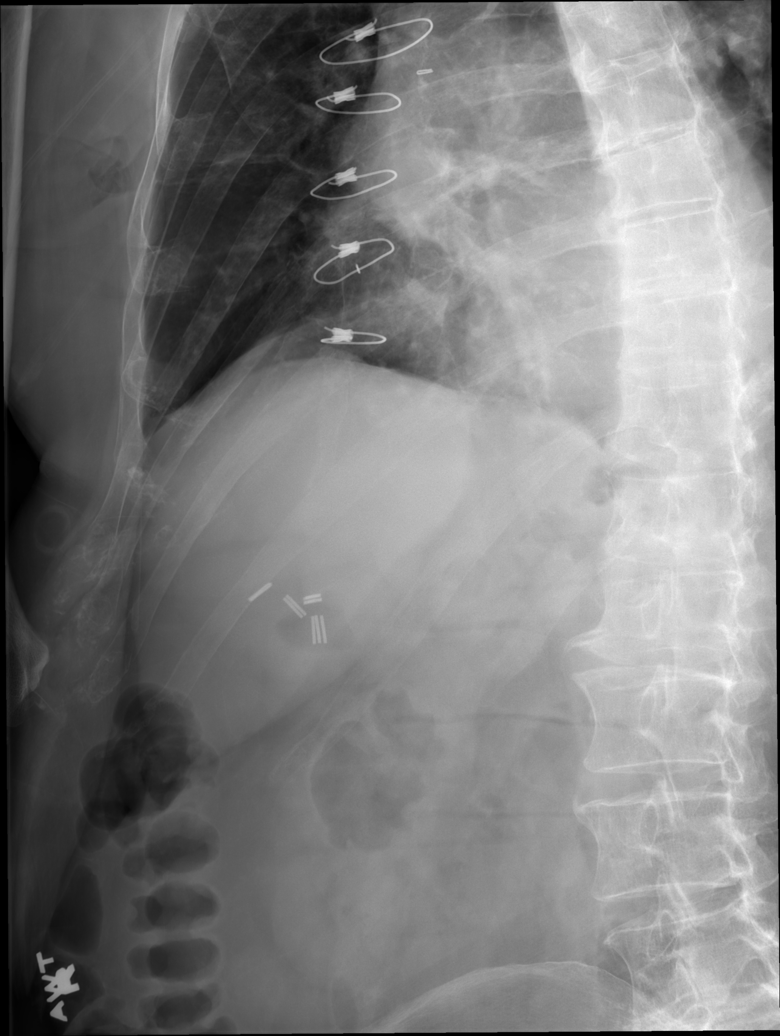

[5 of 5 positions shown; findings below may reference images not displayed]

FINDINGS: Multiple dedicated views of the right ribs demonstrate no
definite acute displaced rib fractures.  The lung volumes remain
low.  There continues to be extensive left sided retrocardiac
opacity concerning for airspace consolidation.  Small - moderate
left pleural effusion is similar to the prior examination.
Bilateral hilar fullness is again noted, and is somewhat masslike,
particularly on the right.  Mild pulmonary venous congestion
without frank pulmonary edema.  Mild cardiomegaly is unchanged.
Atherosclerosis in the thoracic aorta.  Status post median
sternotomy.
IMPRESSION: 1.  No definite acute displaced right-sided rib fractures.
2.  Persistent left lower lobe consolidation concerning for
pneumonia with small - moderate left parapneumonic pleural
effusion.
3.  Persistent mass-like enlargement of the hilar regions
bilaterally, particularly on the right.  Underlying malignancy or
lymphadenopathy is not excluded.  Further evaluation with contrast
enhanced CT of the thorax is recommended to better characterize
these findings and to better evaluate the process in the left lower
lobe.  The given the patient's symptoms, a PE protocol CT scan
would be optimal, as this would also be able to exclude underlying
pulmonary embolism.

## 2013-08-17 IMAGING — CT CT CHEST W/ CM
1 series · 14 of 31 positions shown, 18 images · IV contrast (omnipaque)
Comparison: Bilateral rib radiographs 06/01/2012; prior chest x-ray
05/09/2012

CLINICAL DATA: Follow up abnormal chest x-ray

CT CHEST WITH CONTRAST
TECHNIQUE: Multidetector CT imaging of the chest was performed
following the standard protocol during bolus administration of
intravenous contrast.
Contrast: 100mL OMNIPAQUE IOHEXOL 300 MG/ML  SOLN

[Series 2: chest with st · axial · 0.73mm/px · z∈[+1451,+1721]mm · 14 of 64 slices shown, 18 images]
[im 5/64  mediastinal]
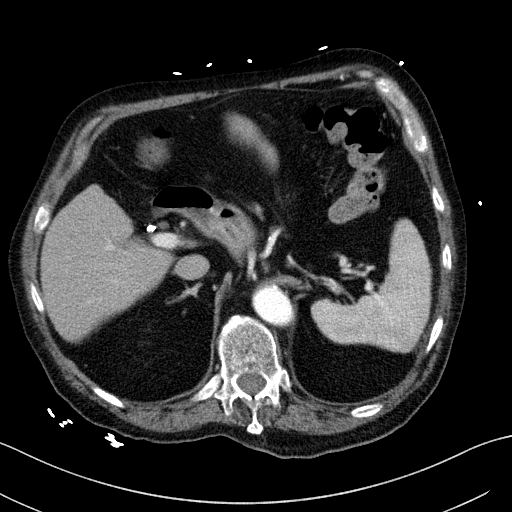
[im 5/64  lung]
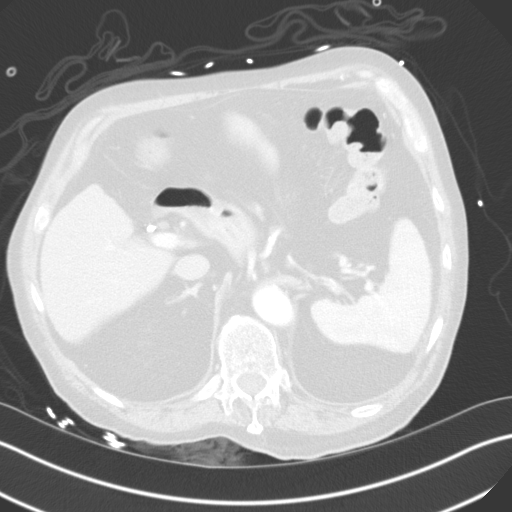
[im 10/64  lung]
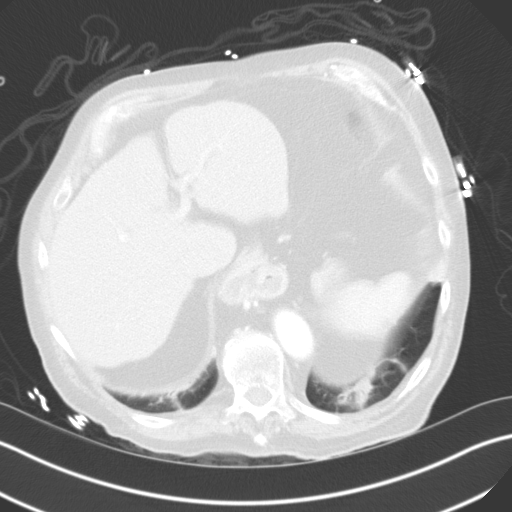
[im 13/64  lung]
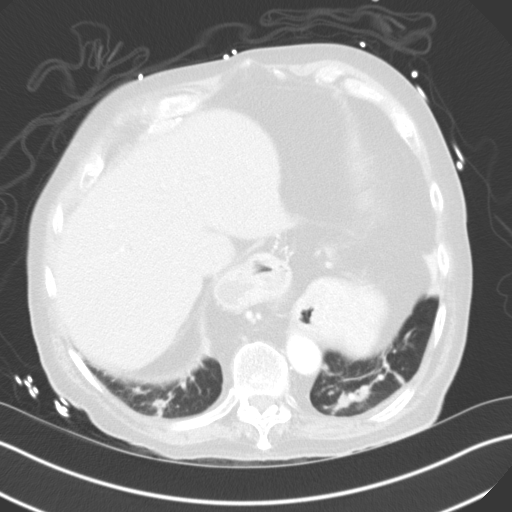
[im 17/64  lung]
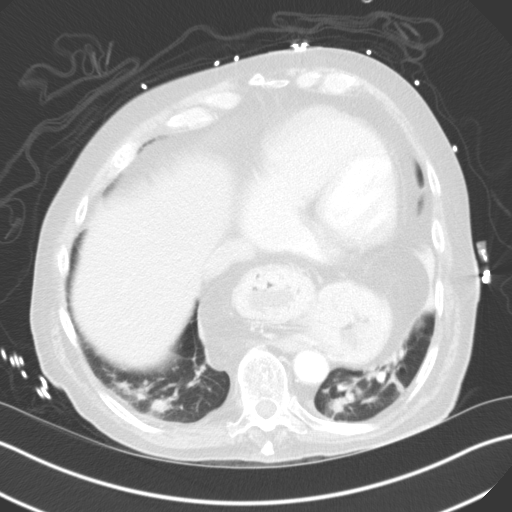
[im 22/64  mediastinal]
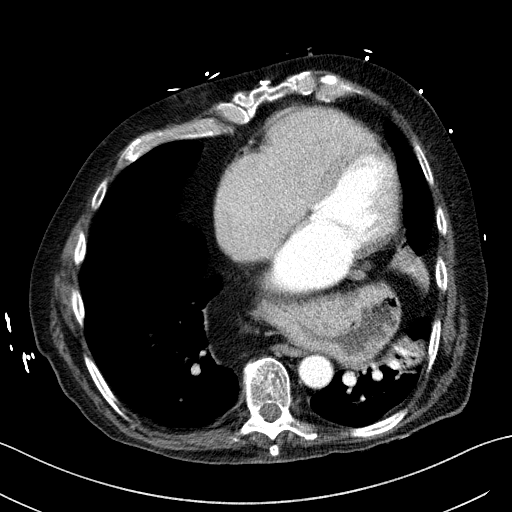
[im 22/64  lung]
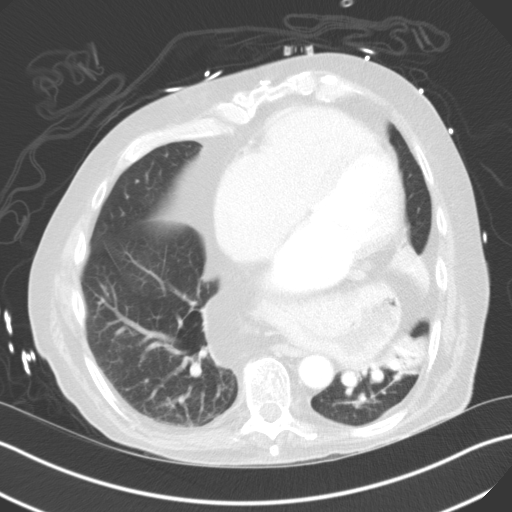
[im 26/64  lung]
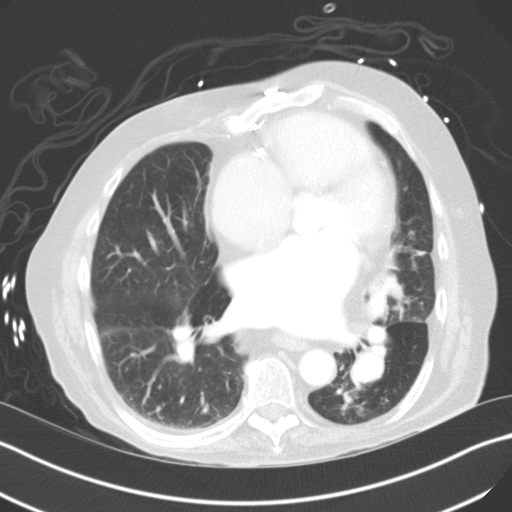
[im 31/64  lung]
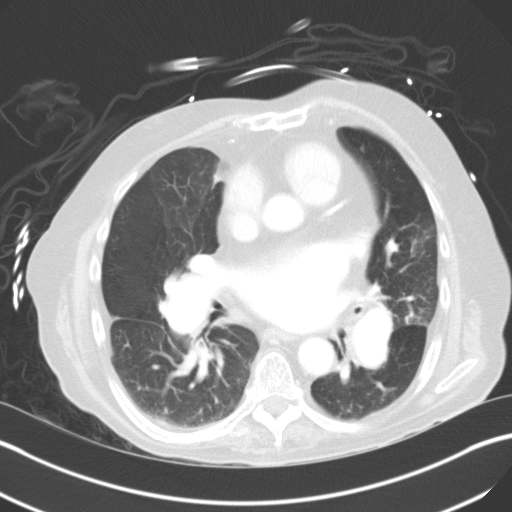
[im 34/64  lung]
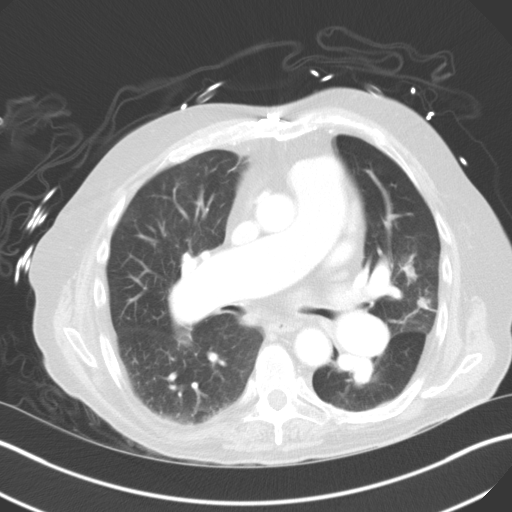
[im 38/64  mediastinal]
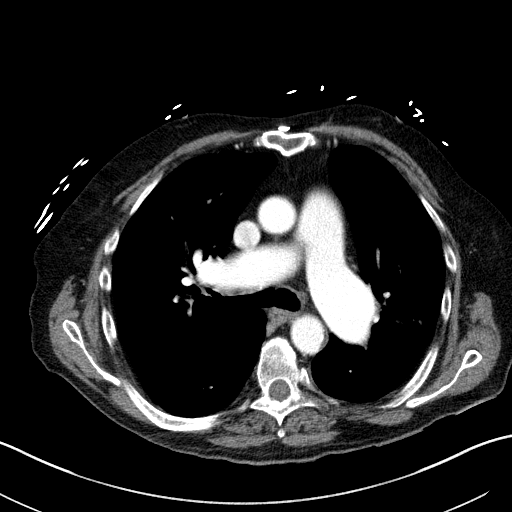
[im 38/64  lung]
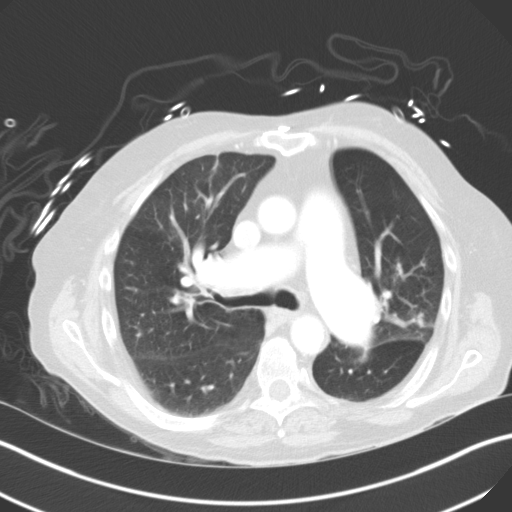
[im 43/64  lung]
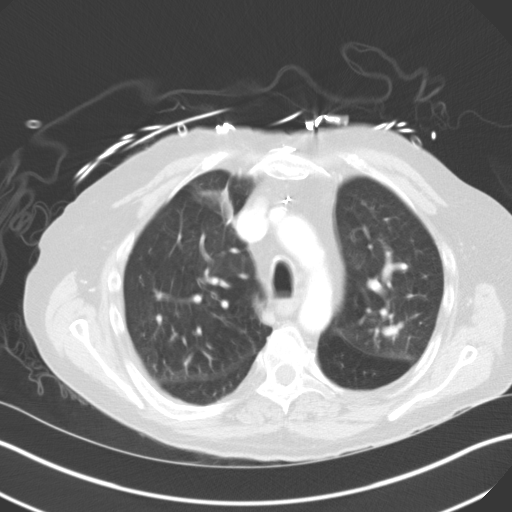
[im 47/64  lung]
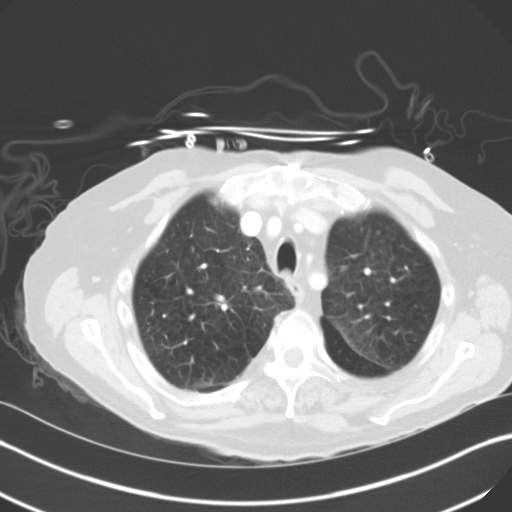
[im 51/64  lung]
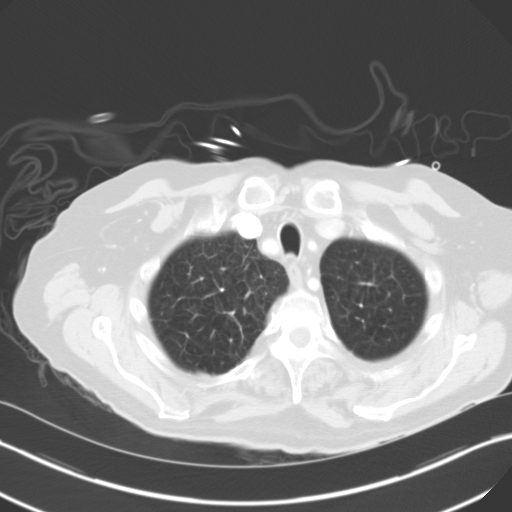
[im 54/64  mediastinal]
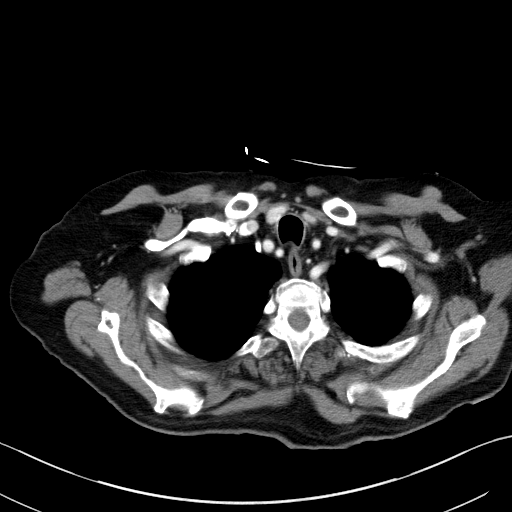
[im 54/64  lung]
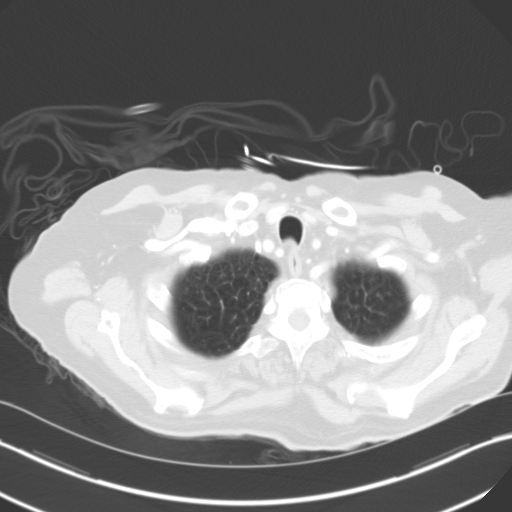
[im 59/64  lung]
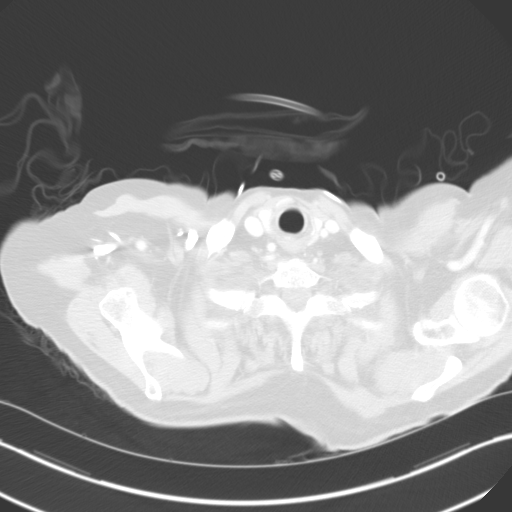

[14 of 31 positions shown; findings below may reference images not displayed]

FINDINGS: Mediastinum: Unremarkable CT appearance of the thyroid gland.  No
suspicious mediastinal or hilar adenopathy.  Large sliding hiatal
hernia with intrathoracic stomach.  Only the distal antrum lies
below the diaphragm.

Heart/Vascular: Marked enlargement of the main and central
pulmonary arteries consistent with underlying pulmonary arterial
hypertension.  No filling defect to suggest pulmonary embolus.  The
heart is enlarged with left greater than right by atrial
enlargement.  Atherosclerotic vascular calcifications noted in the
coronary arteries.  Status post median sternotomy with evidence of
prior right internal mammary artery bypass.  No pericardial
effusion.  Scattered atherosclerotic calcifications throughout the
thoracic aorta without aneurysmal dilatation.  Four vessel arch.
Left vertebral artery arises directly from the aorta.

Lungs/Pleura: Advance centrilobular emphysema. Bibasilar and
nodular consolidation versus pleural parenchymal scarring.
Additionally, there is peribronchovascular macro nodularity in the
left upper lobe peripherally.

Upper Abdomen: Intrathoracic stomach as above.  Surgical changes of
prior cholecystectomy.  Otherwise, visualized upper abdominal
contents unremarkable.

Bones: Acute nondisplaced fractures of the anterolateral aspect of
right ribs six and seven.
IMPRESSION: 1. Acute nondisplaced fractures of the anterolateral aspect of
right ribs six and seven
2.  Peribronchovascular macro nodularity in the periphery of the
left upper lobe consistent with an underlying
infectious/inflammatory process.

3.  Nodular consolidation versus pleural parenchymal scarring with
associated volume loss in the bilateral lower lobes.  These
findings are favored to reflect the sequela of chronic aspiration
given the patient's large hiatal hernia.  Recommend follow-up chest
CT in 3 months to assess stability.

4.  Massive pulmonary arterial enlargement and likely associated bi
atrial enlargement is highly suggestive of pulmonary arterial
hypertension.  This accounts for the appearance of a masslike hilar
enlargement on the recent chest radiographs.

5.  Advanced centrilobular emphysema.

6.  Atherosclerosis including coronary artery disease.

7. Large sliding hiatal hernia with near total intrathoracic
stomach.

## 2013-08-23 ENCOUNTER — Ambulatory Visit (INDEPENDENT_AMBULATORY_CARE_PROVIDER_SITE_OTHER): Payer: Medicare Other | Admitting: General Practice

## 2013-08-23 DIAGNOSIS — Z5181 Encounter for therapeutic drug level monitoring: Secondary | ICD-10-CM

## 2013-08-23 DIAGNOSIS — I4891 Unspecified atrial fibrillation: Secondary | ICD-10-CM

## 2013-08-23 LAB — POCT INR: INR: 2.3

## 2013-08-23 NOTE — Progress Notes (Signed)
Pre visit review using our clinic review tool, if applicable. No additional management support is needed unless otherwise documented below in the visit note. 

## 2013-08-24 ENCOUNTER — Other Ambulatory Visit: Payer: Self-pay | Admitting: Family Medicine

## 2013-09-23 ENCOUNTER — Ambulatory Visit (INDEPENDENT_AMBULATORY_CARE_PROVIDER_SITE_OTHER): Payer: Medicare Other | Admitting: General Practice

## 2013-09-23 DIAGNOSIS — I4891 Unspecified atrial fibrillation: Secondary | ICD-10-CM

## 2013-09-23 DIAGNOSIS — Z5181 Encounter for therapeutic drug level monitoring: Secondary | ICD-10-CM

## 2013-09-23 LAB — POCT INR: INR: 2.3

## 2013-09-23 NOTE — Progress Notes (Signed)
Pre visit review using our clinic review tool, if applicable. No additional management support is needed unless otherwise documented below in the visit note. 

## 2013-10-21 ENCOUNTER — Ambulatory Visit: Payer: Medicare Other

## 2013-11-04 ENCOUNTER — Ambulatory Visit (INDEPENDENT_AMBULATORY_CARE_PROVIDER_SITE_OTHER): Payer: Medicare Other | Admitting: Family

## 2013-11-04 DIAGNOSIS — I4891 Unspecified atrial fibrillation: Secondary | ICD-10-CM

## 2013-11-04 DIAGNOSIS — Z5181 Encounter for therapeutic drug level monitoring: Secondary | ICD-10-CM

## 2013-11-04 LAB — POCT INR: INR: 2.3

## 2013-11-04 NOTE — Patient Instructions (Signed)
Change dosage and take 2 tablets all days except 3 tablets on M/W/F.  Re-check in 6 weeks.   Anticoagulation Dose Instructions as of 11/04/2013     [name] Tue Wed Thu Fri Sat   New Dose 2 mg 3 mg 2 mg 3 mg 2 mg 3 mg 2 mg    Description       Change dosage and take 2 tablets all days except 3 tablets on M/W/F.  Re-check in 6 weeks.

## 2013-12-06 ENCOUNTER — Other Ambulatory Visit: Payer: Self-pay | Admitting: Family Medicine

## 2013-12-06 ENCOUNTER — Other Ambulatory Visit: Payer: Self-pay | Admitting: General Practice

## 2013-12-06 MED ORDER — WARFARIN SODIUM 1 MG PO TABS: ORAL_TABLET | ORAL | Status: DC

## 2013-12-16 ENCOUNTER — Ambulatory Visit (INDEPENDENT_AMBULATORY_CARE_PROVIDER_SITE_OTHER): Payer: Medicare Other | Admitting: General Practice

## 2013-12-16 DIAGNOSIS — Z5181 Encounter for therapeutic drug level monitoring: Secondary | ICD-10-CM

## 2013-12-16 LAB — POCT INR: INR: 1.8

## 2013-12-16 NOTE — Progress Notes (Signed)
Pre visit review using our clinic review tool, if applicable. No additional management support is needed unless otherwise documented below in the visit note. 

## 2013-12-29 ENCOUNTER — Other Ambulatory Visit: Payer: Self-pay | Admitting: Family Medicine

## 2014-01-07 ENCOUNTER — Telehealth: Payer: Self-pay | Admitting: Family Medicine

## 2014-01-07 NOTE — Telephone Encounter (Signed)
Patient Information:  Caller Name: [name]  Phone: [phone]  Patient: [name], [name]  Gender: Male  DOB: 16-Apr-1933  Age: 80 Years  PCP: Carolann Littler (Family Practice)  Office Follow Up:  Does the office need to follow up with this patient?: Yes  Instructions For The Office: Requesting Gabapentin be changed to Lyrica, would like to know if this can be done without appt?  If not he will be out of his Gabapentin in one day;  Disposition is to see within 3 days, has appt. 01/27/14, can this just be moved up per patient request?  RN Note:  Wife states he has an appt. for 01/27/14 but she would like to move this up to discuss medication change;  He will be out of his Gabapentin in a day she states;  Appt. offered today for 3:45 w/Dr. Sarajane Jews but patient unable to get to office today before closing;  She would like to know if Md will change the medication, or refill the Gabapentin for now until he can get seen;  Disposition is to see within 3 days per protocol;  Offered to send to scheduling to sched. for next wk but she would really like to know what the doctor says;  Please f/u if there can be a change in med's prior to 8/27 or if he can be scheduled in for early next week.  Symptoms  Reason For Call & Symptoms: Numbness to feet is getting worse;  He is on Gabapentin but wife would like this changed to Lyrica;  He says the Gabapentin isn't helping much;  It is starting to get worse over the last few weeks;  He is moving slower today due to the discomfort;  Denies any change in color, new swelling or sores to the bottom of the feet.  Reviewed Health History In EMR: Yes  Reviewed Medications In EMR: Yes  Reviewed Allergies In EMR: Yes  Reviewed Surgeries / Procedures: Yes  Date of Onset of Symptoms: 01/07/2014  Guideline(s) Used:  Foot Pain  Disposition Per Guideline:   See Within 3 Days in Office  Reason For Disposition Reached:   Numbness or tingling in feet and new or increased  Advice  Given:  Call Back If:  Swelling, redness, or fever occur  Severe pain not relieved by pain medication  Pain lasts over 7 days  You become worse.  Patient Refused Recommendation:  Patient Refused Appt, Patient Requests Appt At Later Date  Can't come today, will need f/u to schedule for next week after Md reviews note with patient med question.

## 2014-01-08 ENCOUNTER — Other Ambulatory Visit: Payer: Self-pay | Admitting: Family Medicine

## 2014-01-09 NOTE — Telephone Encounter (Signed)
OK to move up appt.

## 2014-01-10 NOTE — Telephone Encounter (Signed)
Can you please call patient to move up his appt.

## 2014-01-12 ENCOUNTER — Telehealth: Payer: Self-pay

## 2014-01-12 NOTE — Telephone Encounter (Signed)
Pt stated that the Potassium 20 mEQ tablet is to large for him to take. Is it okay to switch to Klor-Con 10 MEq 2 bid tabs.  Baxter

## 2014-01-12 NOTE — Telephone Encounter (Signed)
lmom for pt to cb and schedule earlier appt.

## 2014-01-13 ENCOUNTER — Ambulatory Visit: Payer: Medicare Other

## 2014-01-13 MED ORDER — POTASSIUM CHLORIDE CRYS ER 10 MEQ PO TBCR: EXTENDED_RELEASE_TABLET | ORAL | Status: DC

## 2014-01-13 NOTE — Telephone Encounter (Signed)
yes

## 2014-01-13 NOTE — Telephone Encounter (Signed)
Rx changed and Rx sent to pharmacy

## 2014-01-14 NOTE — Telephone Encounter (Signed)
Done

## 2014-01-19 ENCOUNTER — Ambulatory Visit (INDEPENDENT_AMBULATORY_CARE_PROVIDER_SITE_OTHER): Payer: Medicare Other | Admitting: Family

## 2014-01-19 ENCOUNTER — Ambulatory Visit (INDEPENDENT_AMBULATORY_CARE_PROVIDER_SITE_OTHER): Payer: Medicare Other | Admitting: Family Medicine

## 2014-01-19 ENCOUNTER — Encounter: Payer: Self-pay | Admitting: Family Medicine

## 2014-01-19 VITALS — BP 120/70 | HR 74 | Temp 97.6°F | Wt 174.0 lb

## 2014-01-19 DIAGNOSIS — N183 Chronic kidney disease, stage 3 unspecified: Secondary | ICD-10-CM

## 2014-01-19 DIAGNOSIS — I4891 Unspecified atrial fibrillation: Secondary | ICD-10-CM

## 2014-01-19 DIAGNOSIS — R627 Adult failure to thrive: Secondary | ICD-10-CM

## 2014-01-19 DIAGNOSIS — F329 Major depressive disorder, single episode, unspecified: Secondary | ICD-10-CM

## 2014-01-19 DIAGNOSIS — E785 Hyperlipidemia, unspecified: Secondary | ICD-10-CM

## 2014-01-19 DIAGNOSIS — I10 Essential (primary) hypertension: Secondary | ICD-10-CM

## 2014-01-19 DIAGNOSIS — Z23 Encounter for immunization: Secondary | ICD-10-CM

## 2014-01-19 DIAGNOSIS — F32A Depression, unspecified: Secondary | ICD-10-CM

## 2014-01-19 DIAGNOSIS — F3289 Other specified depressive episodes: Secondary | ICD-10-CM

## 2014-01-19 DIAGNOSIS — I482 Chronic atrial fibrillation, unspecified: Secondary | ICD-10-CM

## 2014-01-19 DIAGNOSIS — Z5181 Encounter for therapeutic drug level monitoring: Secondary | ICD-10-CM

## 2014-01-19 LAB — POCT INR: INR: 1.8

## 2014-01-19 NOTE — Progress Notes (Signed)
Pre visit review using our clinic review tool, if applicable. No additional management support is needed unless otherwise documented below in the visit note. 

## 2014-01-19 NOTE — Progress Notes (Signed)
Subjective:    Patient ID: [name], male    DOB: 05/08/1933, 80 y.o.   MRN: [id number]  HPI Patient has complex past medical history. He has history of congestive heart failure, chronic kidney disease, anemia of chronic disease, atrial fibrillation, B12 deficiency, and BPH, COPD, depression, GERD, hyperlipidemia, hypertension, chronic peripheral neuropathy.  He battled with failure to thrive issues last year. His weight has finally stabilized. He continues to take Remeron at night. Mood is stable. Wife states still has poor appetite. Denies any active depression symptoms. No chest pains. No peripheral edema issues. No dyspnea. No history of Prevnar 13. No flu vaccine yet. Medications reviewed. Compliant with all.  Past Medical History  Diagnosis Date  . History of upper gastrointestinal hemorrhage     dr. Maurene Capes, GI  . Cerebrovascular accident 72  . Gastric polyp   . Dysphagia, unspecified(787.20)   . Coronary artery disease     a. Nonobstructive by cath 2009.  [name] Permanent atrial fibrillation     INR followed at [facility].  [name] Hypertension   . Esophagitis   . GERD (gastroesophageal reflux disease)   . Erosive gastritis     H/o GIB felt secondary to this  . Gastric ulcer   . Hiatal hernia   . Internal hemorrhoids   . Hearing impairment   . Vision problems   . Chronic bronchitis     a. h/o resp failure in setting of bronchitis vs PNA 05/2012.  [name] Hyperlipidemia   . ASD (atrial septal defect)     a. s/p repair 1983.  [name] BPH (benign prostatic hyperplasia)   . COPD (chronic obstructive pulmonary disease)   . Anemia   . Depression   . LV dysfunction     a. EF 45-50% by echo 05/2012.  [name] Chronic diarrhea    Past Surgical History  Procedure Laterality Date  . Cholecystectomy  1990s  . Asd repair  [id number]  . Inguinal hernia repair  1963  . Partial small bowel resection  2005    ischemic?  [name] Enteroscopy  05/13/2012    Procedure: ENTEROSCOPY;  Surgeon: Inda Castle, MD;   Location: [facility];  Service: Endoscopy;  Laterality: N/A;  . Cardioversion      possibly 3 times, dr. brody/cooper    reports that he quit smoking about 30 years ago. His smoking use included Cigarettes. He has a 20 pack-year smoking history. He has never used smokeless tobacco. He reports that he does not drink alcohol or use illicit drugs. family history includes Heart disease in his father and mother; Prostate cancer in his brother. Allergies  Allergen Reactions  . Penicillins     hives      Review of Systems  Constitutional: Positive for fatigue. Negative for fever, chills and unexpected weight change.  Respiratory: Negative for cough and shortness of breath.   Cardiovascular: Negative for chest pain, palpitations and leg swelling.  Gastrointestinal: Negative for nausea, vomiting and abdominal pain.  Endocrine: Negative for polydipsia and polyuria.  Genitourinary: Negative for dysuria.  Neurological: Negative for dizziness and headaches.  Psychiatric/Behavioral: Negative for confusion.       Objective:   Physical Exam  Constitutional: He is oriented to person, place, and time. He appears well-developed and well-nourished.  Neck: Neck supple. No thyromegaly present.  Cardiovascular: Normal rate.   Irregular rhythm rate controlled  Pulmonary/Chest: Effort normal and breath sounds normal. No respiratory distress. He has no wheezes. He has no rales.  Musculoskeletal: He  exhibits no edema.  Neurological: He is alert and oriented to person, place, and time.  Psychiatric: He has a normal mood and affect. His behavior is normal.          Assessment & Plan:  #1 chronic atrial fibrillation. Rate controlled. Stable on Coumadin #2 history of hypertension which is stable #3 history of hyperlipidemia. Lipids were checked last winter and stable. Recheck in 6 months #4 history of failure to thrive. Weight stable. Continue Remeron 15 mg at night

## 2014-01-20 ENCOUNTER — Ambulatory Visit: Payer: Medicare Other

## 2014-01-27 ENCOUNTER — Ambulatory Visit: Payer: Medicare Other | Admitting: Family Medicine

## 2014-02-10 ENCOUNTER — Other Ambulatory Visit: Payer: Self-pay | Admitting: Family Medicine

## 2014-02-10 ENCOUNTER — Other Ambulatory Visit: Payer: Self-pay | Admitting: Internal Medicine

## 2014-02-15 ENCOUNTER — Ambulatory Visit: Payer: Medicare Other | Admitting: Family

## 2014-02-17 ENCOUNTER — Ambulatory Visit (INDEPENDENT_AMBULATORY_CARE_PROVIDER_SITE_OTHER): Payer: Medicare Other | Admitting: Family

## 2014-02-17 DIAGNOSIS — I4891 Unspecified atrial fibrillation: Secondary | ICD-10-CM

## 2014-02-17 DIAGNOSIS — I482 Chronic atrial fibrillation, unspecified: Secondary | ICD-10-CM

## 2014-02-17 DIAGNOSIS — Z5181 Encounter for therapeutic drug level monitoring: Secondary | ICD-10-CM

## 2014-02-17 LAB — POCT INR: INR: 1.7

## 2014-02-17 NOTE — Patient Instructions (Signed)
Today, take an extra 1mg  (4mg ).  Take 2 tablets Monday and Friday. 3 tablets all other day.  Re-check in 4 weeks.  Anticoagulation Dose Instructions as of 02/17/2014     Dorene Grebe Tue Wed Thu Fri Sat   New Dose 3 mg 2 mg 3 mg 3 mg 3 mg 2 mg 3 mg    Description       Today, take an extra 1mg  (4mg ).  Take 2 tablets Monday and Friday. 3 tablets all other day.  Re-check in 4 weeks.

## 2014-03-16 ENCOUNTER — Ambulatory Visit (INDEPENDENT_AMBULATORY_CARE_PROVIDER_SITE_OTHER): Payer: Medicare Other | Admitting: Family

## 2014-03-16 DIAGNOSIS — Z5181 Encounter for therapeutic drug level monitoring: Secondary | ICD-10-CM

## 2014-03-16 LAB — POCT INR: INR: 2.9

## 2014-03-17 ENCOUNTER — Ambulatory Visit: Payer: Medicare Other

## 2014-03-21 ENCOUNTER — Other Ambulatory Visit: Payer: Self-pay | Admitting: Family Medicine

## 2014-03-23 ENCOUNTER — Other Ambulatory Visit: Payer: Self-pay | Admitting: Family Medicine

## 2014-04-07 ENCOUNTER — Telehealth: Payer: Self-pay

## 2014-04-07 ENCOUNTER — Ambulatory Visit (INDEPENDENT_AMBULATORY_CARE_PROVIDER_SITE_OTHER): Payer: Medicare Other | Admitting: Family

## 2014-04-07 DIAGNOSIS — I4891 Unspecified atrial fibrillation: Secondary | ICD-10-CM

## 2014-04-07 DIAGNOSIS — Z5181 Encounter for therapeutic drug level monitoring: Secondary | ICD-10-CM

## 2014-04-07 LAB — POCT INR: INR: 3.4

## 2014-04-07 NOTE — Patient Instructions (Signed)
Hold Coumadin today only. Then continue to Take 2 tablets Monday and Friday. 3 tablets all other day.  Re-check in 4 weeks.  Anticoagulation Dose Instructions as of 04/07/2014      [name] Tue Wed Thu Fri Sat   New Dose 3 mg 2 mg 3 mg 3 mg 3 mg 2 mg 3 mg    Description        Hold Coumadin today only. Then continue to Take 2 tablets Monday and Friday. 3 tablets all other day.  Re-check in 4 weeks.

## 2014-04-07 NOTE — Telephone Encounter (Signed)
Open by mistake

## 2014-04-08 ENCOUNTER — Ambulatory Visit: Payer: Medicare Other | Admitting: Family

## 2014-04-11 NOTE — Telephone Encounter (Signed)
Pt would like an inhaler.  Advised that pt may need an appt to discuss this.  Pls advise.

## 2014-04-11 NOTE — Telephone Encounter (Signed)
Tired to call patient line busy, to see why patient needs a inhaler.

## 2014-04-19 ENCOUNTER — Other Ambulatory Visit: Payer: Self-pay | Admitting: Family

## 2014-04-25 ENCOUNTER — Ambulatory Visit: Payer: Medicare Other | Admitting: Internal Medicine

## 2014-04-25 ENCOUNTER — Telehealth: Payer: Self-pay | Admitting: Family Medicine

## 2014-04-25 ENCOUNTER — Other Ambulatory Visit: Payer: Self-pay | Admitting: Family Medicine

## 2014-04-25 NOTE — Telephone Encounter (Signed)
Noted  

## 2014-04-25 NOTE — Telephone Encounter (Signed)
Patient Information:  Caller Name: [name]  Phone: [phone]  Patient: [name], [name]  Gender: Male  DOB: 02-10-1933  Age: 80 Years  PCP: Carolann Littler (Family Practice)  Office Follow Up:  Does the office need to follow up with this patient?: No  Instructions For The Office: N/A   Symptoms  Reason For Call & Symptoms: Wife is calling and states that pt is feeling tired and out of breath; sx started 3 months ago; no longer using home oxygen for the last year; started using neb treatments again in the last 2 months;  gets out of breath walking to the mail box  Reviewed Health History In EMR: Yes  Reviewed Medications In EMR: Yes  Reviewed Allergies In EMR: Yes  Reviewed Surgeries / Procedures: Yes  Date of Onset of Symptoms: Unknown  Treatments Tried: nebulaizers  Treatments Tried Worked: No  Guideline(s) Used:  Breathing Difficulty  Disposition Per Guideline:   Go to Office Now  Reason For Disposition Reached:   Mild difficulty breathing (e.g., minimal/no SOB at rest, SOB with walking, pulse < 100) of new onset or worse than normal  Advice Given:  Call Back If:  Severe difficulty breathing occurs  You become worse.  Patient Will Follow Care Advice:  YES  Appointment Scheduled:  04/25/2014 16:00:00 Appointment Scheduled Provider:  Shawna Orleans, Doe-Hyun Herbie [place]) (Adults only)  Caller requesting to make an appt for tomorrow; with much encouragement scheduled appt for today at 4:00pm; caller states that he will be fine to wait until 4:00pm; pt is heading to [employer] now;  instructed to call back if sx worsen prior to appt;

## 2014-04-27 ENCOUNTER — Ambulatory Visit (INDEPENDENT_AMBULATORY_CARE_PROVIDER_SITE_OTHER): Payer: Medicare Other | Admitting: Family Medicine

## 2014-04-27 ENCOUNTER — Encounter: Payer: Self-pay | Admitting: Family Medicine

## 2014-04-27 ENCOUNTER — Ambulatory Visit: Payer: Medicare Other | Admitting: Internal Medicine

## 2014-04-27 VITALS — BP 122/70 | HR 66 | Temp 97.4°F | Wt 178.0 lb

## 2014-04-27 DIAGNOSIS — D638 Anemia in other chronic diseases classified elsewhere: Secondary | ICD-10-CM

## 2014-04-27 DIAGNOSIS — J9610 Chronic respiratory failure, unspecified whether with hypoxia or hypercapnia: Secondary | ICD-10-CM

## 2014-04-27 DIAGNOSIS — R0602 Shortness of breath: Secondary | ICD-10-CM

## 2014-04-27 LAB — CBC WITH DIFFERENTIAL/PLATELET
Basophils Absolute: 0 10*3/uL (ref 0.0–0.1)
Basophils Relative: 1 % (ref 0–1)
Eosinophils Absolute: 0.2 10*3/uL (ref 0.0–0.7)
Eosinophils Relative: 4 % (ref 0–5)
HCT: 37.1 % — ABNORMAL LOW (ref 39.0–52.0)
HEMOGLOBIN: 12.6 g/dL — AB (ref 13.0–17.0)
LYMPHS ABS: 1.1 10*3/uL (ref 0.7–4.0)
LYMPHS PCT: 22 % (ref 12–46)
MCH: 30.1 pg (ref 26.0–34.0)
MCHC: 34 g/dL (ref 30.0–36.0)
MCV: 88.8 fL (ref 78.0–100.0)
MONOS PCT: 10 % (ref 3–12)
MPV: 9.7 fL (ref 9.4–12.4)
Monocytes Absolute: 0.5 10*3/uL (ref 0.1–1.0)
Neutro Abs: 3.1 10*3/uL (ref 1.7–7.7)
Neutrophils Relative %: 63 % (ref 43–77)
Platelets: 162 10*3/uL (ref 150–400)
RBC: 4.18 MIL/uL — AB (ref 4.22–5.81)
RDW: 14.2 % (ref 11.5–15.5)
WBC: 4.9 10*3/uL (ref 4.0–10.5)

## 2014-04-27 LAB — BASIC METABOLIC PANEL
BUN: 19 mg/dL (ref 6–23)
CO2: 26 meq/L (ref 19–32)
Calcium: 8.7 mg/dL (ref 8.4–10.5)
Chloride: 107 mEq/L (ref 96–112)
Creat: 1.21 mg/dL (ref 0.50–1.35)
Glucose, Bld: 121 mg/dL — ABNORMAL HIGH (ref 70–99)
Potassium: 4.6 mEq/L (ref 3.5–5.3)
SODIUM: 141 meq/L (ref 135–145)

## 2014-04-27 MED ORDER — ALBUTEROL SULFATE HFA 108 (90 BASE) MCG/ACT IN AERS: 2.0000 | INHALATION_SPRAY | Freq: Four times a day (QID) | RESPIRATORY_TRACT | Status: DC | PRN

## 2014-04-27 NOTE — Progress Notes (Signed)
Subjective:    Patient ID: [name], male    DOB: [birth year], 80 y.o.   MRN: [id number]  HPI Patient has complicated past medical history. He has many chronic problems including history of recurrent GI bleed, chronic anemia, stage III chronic kidney disease, peripheral neuropathy, history of protein calorie malnutrition, hypertension, hyperlipidemia, CAD, COPD, history of CHF, history of atrial fibrillation. He is seen today with some dyspnea for about 2-3 months. This is mostly exertional. He has never had chest pain. No orthopnea. He has some chronic mild edema which is unchanged. His weight is up 4 pounds from last visit but they are not monitoring home weights. He had echocardiogram in February 2014 with ejection fraction 45-50%.  He uses home nebulizer with albuterol and ipratropium. No recent cough. No fevers or chills. Medications reviewed. He takes furosemide fairly low dosage.  Past Medical History  Diagnosis Date  . History of upper gastrointestinal hemorrhage     dr. Maurene Capes, GI  . Cerebrovascular accident 74  . Gastric polyp   . Dysphagia, unspecified(787.20)   . Coronary artery disease     a. Nonobstructive by cath 2009.  [name] Permanent atrial fibrillation     INR followed at [facility].  [name] Hypertension   . Esophagitis   . GERD (gastroesophageal reflux disease)   . Erosive gastritis     H/o GIB felt secondary to this  . Gastric ulcer   . Hiatal hernia   . Internal hemorrhoids   . Hearing impairment   . Vision problems   . Chronic bronchitis     a. h/o resp failure in setting of bronchitis vs PNA 05/2012.  [name] Hyperlipidemia   . ASD (atrial septal defect)     a. s/p repair 1983.  [name] BPH (benign prostatic hyperplasia)   . COPD (chronic obstructive pulmonary disease)   . Anemia   . Depression   . LV dysfunction     a. EF 45-50% by echo 05/2012.  [name] Chronic diarrhea    Past Surgical History  Procedure Laterality Date  . Cholecystectomy  1990s  . Asd repair  [id number]  .  Inguinal hernia repair  1963  . Partial small bowel resection  2005    ischemic?  [name] Enteroscopy  05/13/2012    Procedure: ENTEROSCOPY;  Surgeon: Inda Castle, MD;  Location: [facility];  Service: Endoscopy;  Laterality: N/A;  . Cardioversion      possibly 3 times, dr. brody/cooper    reports that he quit smoking about 30 years ago. His smoking use included Cigarettes. He has a 20 pack-year smoking history. He has never used smokeless tobacco. He reports that he does not drink alcohol or use illicit drugs. family history includes Heart disease in his father and mother; Prostate cancer in his brother. Allergies  Allergen Reactions  . Penicillins     hives      Review of Systems  Constitutional: Positive for fatigue. Negative for fever and chills.  Respiratory: Positive for shortness of breath. Negative for cough.   Cardiovascular: Positive for leg swelling. Negative for chest pain and palpitations.  Gastrointestinal: Negative for abdominal pain.  Genitourinary: Negative for dysuria.  Neurological: Negative for dizziness.       Objective:   Physical Exam  Constitutional: He appears well-developed and well-nourished.  Neck: Neck supple. No thyromegaly present.  Cardiovascular: Normal rate.   Irregularly irregular rhythm rate controlled  Pulmonary/Chest: Effort normal and breath sounds normal.  Somewhat diminished breath sounds but clear  Musculoskeletal: He exhibits edema.  Trace edema legs bilaterally          Assessment & Plan:  Exertional dyspnea. Likely multifactorial-history of chronic lung disease, chronic anemia,, history of CHF. His symptom onset has been very gradual. He is not describing orthopnea. Check labs with basic metabolic panel, CBC, BNP level. Consider repeat echocardiogram.

## 2014-04-27 NOTE — Progress Notes (Signed)
Pre visit review using our clinic review tool, if applicable. No additional management support is needed unless otherwise documented below in the visit note. 

## 2014-04-27 NOTE — Patient Instructions (Signed)

## 2014-04-28 LAB — BRAIN NATRIURETIC PEPTIDE: Brain Natriuretic Peptide: 65.2 pg/mL (ref 0.0–100.0)

## 2014-05-01 NOTE — Addendum Note (Signed)
Addended by: [name] on: 05/01/2014 08:47 AM   Modules accepted: Orders

## 2014-05-02 ENCOUNTER — Telehealth: Payer: Self-pay | Admitting: Family Medicine

## 2014-05-02 ENCOUNTER — Other Ambulatory Visit: Payer: Self-pay | Admitting: Internal Medicine

## 2014-05-02 NOTE — Telephone Encounter (Signed)
Increase Remeron to 15 mg one daily

## 2014-05-02 NOTE — Telephone Encounter (Signed)
Stew at Pharm called to clarify rx for pt/ pt  States 1/2 day does not work and increased mirtazapine (REMERON) 15 MG tablet  to 1/day.  Pharm does not see where this has been changed by dr.  Would like a cb. pls advise

## 2014-05-03 MED ORDER — MIRTAZAPINE 15 MG PO TABS: 15.0000 mg | ORAL_TABLET | Freq: Every day | ORAL | Status: DC

## 2014-05-03 NOTE — Telephone Encounter (Signed)
Pharmacist is informed.

## 2014-05-05 ENCOUNTER — Ambulatory Visit (INDEPENDENT_AMBULATORY_CARE_PROVIDER_SITE_OTHER): Payer: Medicare Other | Admitting: Family

## 2014-05-05 ENCOUNTER — Ambulatory Visit (HOSPITAL_COMMUNITY): Payer: Medicare Other | Attending: Internal Medicine | Admitting: Radiology

## 2014-05-05 DIAGNOSIS — I10 Essential (primary) hypertension: Secondary | ICD-10-CM | POA: Diagnosis not present

## 2014-05-05 DIAGNOSIS — R0600 Dyspnea, unspecified: Secondary | ICD-10-CM | POA: Insufficient documentation

## 2014-05-05 DIAGNOSIS — E785 Hyperlipidemia, unspecified: Secondary | ICD-10-CM | POA: Insufficient documentation

## 2014-05-05 DIAGNOSIS — Z5181 Encounter for therapeutic drug level monitoring: Secondary | ICD-10-CM

## 2014-05-05 DIAGNOSIS — Z87891 Personal history of nicotine dependence: Secondary | ICD-10-CM | POA: Insufficient documentation

## 2014-05-05 DIAGNOSIS — I4891 Unspecified atrial fibrillation: Secondary | ICD-10-CM

## 2014-05-05 DIAGNOSIS — R0602 Shortness of breath: Secondary | ICD-10-CM

## 2014-05-05 LAB — POCT INR: INR: 1.9

## 2014-05-05 NOTE — Patient Instructions (Signed)
Today only, take 4 mg . Then continue 2 tablets Monday and Friday. 3 tablets all other day.  Re-check in 4 weeks.  Anticoagulation Dose Instructions as of 05/05/2014      Dorene Grebe Tue Wed Thu Fri Sat   New Dose 3 mg 2 mg 3 mg 3 mg 3 mg 2 mg 3 mg    Description        Today only, take 4 mg . Then continue 2 tablets Monday and Friday. 3 tablets all other day.  Re-check in 4 weeks.

## 2014-05-05 NOTE — Progress Notes (Signed)
Echocardiogram performed.  

## 2014-05-16 ENCOUNTER — Other Ambulatory Visit: Payer: Self-pay | Admitting: Family

## 2014-06-01 ENCOUNTER — Ambulatory Visit (INDEPENDENT_AMBULATORY_CARE_PROVIDER_SITE_OTHER): Payer: Medicare Other | Admitting: Family

## 2014-06-01 DIAGNOSIS — Z5181 Encounter for therapeutic drug level monitoring: Secondary | ICD-10-CM

## 2014-06-01 DIAGNOSIS — I4891 Unspecified atrial fibrillation: Secondary | ICD-10-CM

## 2014-06-01 LAB — POCT INR: INR: 2.2

## 2014-06-01 NOTE — Patient Instructions (Signed)
Continue 2 tablets Monday and Friday. 3 tablets all other day.  Re-check in 4 weeks.  Anticoagulation Dose Instructions as of 06/01/2014      Dorene Grebe Tue Wed Thu Fri Sat   New Dose 3 mg 2 mg 3 mg 3 mg 3 mg 2 mg 3 mg    Description        Continue 2 tablets Monday and Friday. 3 tablets all other day.  Re-check in 4 weeks.

## 2014-06-10 ENCOUNTER — Other Ambulatory Visit: Payer: Self-pay | Admitting: Family Medicine

## 2014-06-10 ENCOUNTER — Other Ambulatory Visit: Payer: Self-pay | Admitting: Family

## 2014-06-13 ENCOUNTER — Other Ambulatory Visit: Payer: Self-pay | Admitting: Internal Medicine

## 2014-06-20 ENCOUNTER — Other Ambulatory Visit: Payer: Self-pay | Admitting: Family Medicine

## 2014-06-30 ENCOUNTER — Ambulatory Visit (INDEPENDENT_AMBULATORY_CARE_PROVIDER_SITE_OTHER): Payer: Medicare Other | Admitting: Family

## 2014-06-30 DIAGNOSIS — I4891 Unspecified atrial fibrillation: Secondary | ICD-10-CM

## 2014-06-30 DIAGNOSIS — Z5181 Encounter for therapeutic drug level monitoring: Secondary | ICD-10-CM

## 2014-06-30 LAB — POCT INR: INR: 2.7

## 2014-06-30 NOTE — Patient Instructions (Signed)
Continue 2 tablets Monday and Friday. 3 tablets all other day.  Re-check in 4 weeks.  Anticoagulation Dose Instructions as of 06/30/2014      Dorene Grebe Tue Wed Thu Fri Sat   New Dose 3 mg 2 mg 3 mg 3 mg 3 mg 2 mg 3 mg    Description        Continue 2 tablets Monday and Friday. 3 tablets all other day.  Re-check in 4 weeks.

## 2014-07-28 ENCOUNTER — Ambulatory Visit (INDEPENDENT_AMBULATORY_CARE_PROVIDER_SITE_OTHER): Payer: Medicare Other | Admitting: General Practice

## 2014-07-28 DIAGNOSIS — Z5181 Encounter for therapeutic drug level monitoring: Secondary | ICD-10-CM

## 2014-07-28 LAB — POCT INR: INR: 4.8

## 2014-07-28 NOTE — Progress Notes (Signed)
Pre visit review using our clinic review tool, if applicable. No additional management support is needed unless otherwise documented below in the visit note. 

## 2014-08-01 ENCOUNTER — Other Ambulatory Visit: Payer: Self-pay | Admitting: Family

## 2014-08-02 ENCOUNTER — Other Ambulatory Visit: Payer: Self-pay | Admitting: General Practice

## 2014-08-02 MED ORDER — WARFARIN SODIUM 1 MG PO TABS: ORAL_TABLET | ORAL | Status: DC

## 2014-08-08 ENCOUNTER — Ambulatory Visit (INDEPENDENT_AMBULATORY_CARE_PROVIDER_SITE_OTHER): Payer: Medicare Other | Admitting: General Practice

## 2014-08-08 DIAGNOSIS — Z5181 Encounter for therapeutic drug level monitoring: Secondary | ICD-10-CM

## 2014-08-08 LAB — POCT INR: INR: 3.3

## 2014-08-08 NOTE — Progress Notes (Signed)
Pre visit review using our clinic review tool, if applicable. No additional management support is needed unless otherwise documented below in the visit note. 

## 2014-08-19 ENCOUNTER — Encounter: Payer: Self-pay | Admitting: Nurse Practitioner

## 2014-08-19 ENCOUNTER — Encounter (HOSPITAL_COMMUNITY): Payer: Self-pay | Admitting: Emergency Medicine

## 2014-08-19 ENCOUNTER — Inpatient Hospital Stay (HOSPITAL_COMMUNITY)
Admission: EM | Admit: 2014-08-19 | Discharge: 2014-08-20 | DRG: 313 | Disposition: A | Discharge status: Home / Self Care | Payer: Medicare Other | Attending: Cardiovascular Disease | Admitting: Cardiovascular Disease

## 2014-08-19 ENCOUNTER — Emergency Department (HOSPITAL_COMMUNITY): Payer: Medicare Other

## 2014-08-19 ENCOUNTER — Ambulatory Visit (INDEPENDENT_AMBULATORY_CARE_PROVIDER_SITE_OTHER): Payer: Medicare Other | Admitting: Nurse Practitioner

## 2014-08-19 VITALS — BP 193/84 | HR 92

## 2014-08-19 DIAGNOSIS — Z87891 Personal history of nicotine dependence: Secondary | ICD-10-CM | POA: Diagnosis not present

## 2014-08-19 DIAGNOSIS — Z8673 Personal history of transient ischemic attack (TIA), and cerebral infarction without residual deficits: Secondary | ICD-10-CM | POA: Diagnosis not present

## 2014-08-19 DIAGNOSIS — R079 Chest pain, unspecified: Secondary | ICD-10-CM

## 2014-08-19 DIAGNOSIS — I2510 Atherosclerotic heart disease of native coronary artery without angina pectoris: Secondary | ICD-10-CM | POA: Diagnosis present

## 2014-08-19 DIAGNOSIS — D638 Anemia in other chronic diseases classified elsewhere: Secondary | ICD-10-CM | POA: Diagnosis present

## 2014-08-19 DIAGNOSIS — I482 Chronic atrial fibrillation: Secondary | ICD-10-CM | POA: Diagnosis present

## 2014-08-19 DIAGNOSIS — I495 Sick sinus syndrome: Secondary | ICD-10-CM | POA: Diagnosis present

## 2014-08-19 DIAGNOSIS — Z880 Allergy status to penicillin: Secondary | ICD-10-CM

## 2014-08-19 DIAGNOSIS — H9190 Unspecified hearing loss, unspecified ear: Secondary | ICD-10-CM | POA: Diagnosis present

## 2014-08-19 DIAGNOSIS — I129 Hypertensive chronic kidney disease with stage 1 through stage 4 chronic kidney disease, or unspecified chronic kidney disease: Secondary | ICD-10-CM | POA: Diagnosis present

## 2014-08-19 DIAGNOSIS — N183 Chronic kidney disease, stage 3 unspecified: Secondary | ICD-10-CM | POA: Diagnosis present

## 2014-08-19 DIAGNOSIS — E785 Hyperlipidemia, unspecified: Secondary | ICD-10-CM | POA: Diagnosis present

## 2014-08-19 DIAGNOSIS — I481 Persistent atrial fibrillation: Secondary | ICD-10-CM | POA: Diagnosis present

## 2014-08-19 DIAGNOSIS — J441 Chronic obstructive pulmonary disease with (acute) exacerbation: Secondary | ICD-10-CM | POA: Diagnosis present

## 2014-08-19 DIAGNOSIS — I10 Essential (primary) hypertension: Secondary | ICD-10-CM | POA: Diagnosis present

## 2014-08-19 DIAGNOSIS — Z7901 Long term (current) use of anticoagulants: Secondary | ICD-10-CM

## 2014-08-19 DIAGNOSIS — J449 Chronic obstructive pulmonary disease, unspecified: Secondary | ICD-10-CM | POA: Diagnosis present

## 2014-08-19 DIAGNOSIS — K219 Gastro-esophageal reflux disease without esophagitis: Secondary | ICD-10-CM | POA: Diagnosis present

## 2014-08-19 DIAGNOSIS — F329 Major depressive disorder, single episode, unspecified: Secondary | ICD-10-CM | POA: Diagnosis present

## 2014-08-19 DIAGNOSIS — N400 Enlarged prostate without lower urinary tract symptoms: Secondary | ICD-10-CM | POA: Diagnosis present

## 2014-08-19 DIAGNOSIS — R1310 Dysphagia, unspecified: Secondary | ICD-10-CM | POA: Diagnosis present

## 2014-08-19 DIAGNOSIS — Z8711 Personal history of peptic ulcer disease: Secondary | ICD-10-CM | POA: Diagnosis not present

## 2014-08-19 DIAGNOSIS — I4891 Unspecified atrial fibrillation: Secondary | ICD-10-CM | POA: Diagnosis present

## 2014-08-19 LAB — CBC
HCT: 37.6 % — ABNORMAL LOW (ref 39.0–52.0)
Hemoglobin: 11.7 g/dL — ABNORMAL LOW (ref 13.0–17.0)
MCH: 27.5 pg (ref 26.0–34.0)
MCHC: 31.1 g/dL (ref 30.0–36.0)
MCV: 88.5 fL (ref 78.0–100.0)
Platelets: 126 10*3/uL — ABNORMAL LOW (ref 150–400)
RBC: 4.25 MIL/uL (ref 4.22–5.81)
RDW: 14.6 % (ref 11.5–15.5)
WBC: 4.2 10*3/uL (ref 4.0–10.5)

## 2014-08-19 LAB — BASIC METABOLIC PANEL
ANION GAP: 7 (ref 5–15)
BUN: 11 mg/dL (ref 6–23)
CO2: 27 mmol/L (ref 19–32)
Calcium: 8.3 mg/dL — ABNORMAL LOW (ref 8.4–10.5)
Chloride: 108 mmol/L (ref 96–112)
Creatinine, Ser: 1.08 mg/dL (ref 0.50–1.35)
GFR calc non Af Amer: 63 mL/min — ABNORMAL LOW (ref >90)
GFR, EST AFRICAN AMERICAN: 73 mL/min — AB (ref >90)
Glucose, Bld: 146 mg/dL — ABNORMAL HIGH (ref 70–99)
Potassium: 3.9 mmol/L (ref 3.5–5.1)
SODIUM: 142 mmol/L (ref 135–145)

## 2014-08-19 LAB — I-STAT TROPONIN, ED: Troponin i, poc: 0.01 ng/mL (ref 0.00–0.08)

## 2014-08-19 LAB — PROTIME-INR
INR: 2.38 — ABNORMAL HIGH (ref 0.00–1.49)
PROTHROMBIN TIME: 26.2 s — AB (ref 11.6–15.2)

## 2014-08-19 LAB — TROPONIN I: Troponin I: <0.03 ng/mL (ref <0.031)

## 2014-08-19 MED ORDER — NITROGLYCERIN 0.4 MG SL SUBL
0.4000 mg | SUBLINGUAL_TABLET | Freq: Once | SUBLINGUAL | Status: AC
  Administered 2014-08-19: 0.4 mg via SUBLINGUAL

## 2014-08-19 MED ORDER — MORPHINE SULFATE 4 MG/ML IJ SOLN
4.0000 mg | INTRAMUSCULAR | Status: DC | PRN
  Administered 2014-08-20: 2 mg via INTRAVENOUS
  Filled 2014-08-19 (×2): qty 1

## 2014-08-19 MED ORDER — ONDANSETRON HCL 4 MG/2ML IJ SOLN
4.0000 mg | Freq: Once | INTRAMUSCULAR | Status: DC
  Filled 2014-08-19: qty 2

## 2014-08-19 MED ORDER — NITROGLYCERIN IN D5W 200-5 MCG/ML-% IV SOLN
5.0000 ug/min | Freq: Once | INTRAVENOUS | Status: AC
  Administered 2014-08-19: 5 ug/min via INTRAVENOUS
  Filled 2014-08-19: qty 250

## 2014-08-19 MED ORDER — NITROGLYCERIN IN D5W 200-5 MCG/ML-% IV SOLN
15.0000 ug/min | INTRAVENOUS | Status: DC
  Administered 2014-08-20: 15 ug/min via INTRAVENOUS

## 2014-08-19 NOTE — ED Provider Notes (Signed)
CSN: [id number]     Arrival date & time 08/19/14  1834 History   First MD Initiated Contact with Patient 08/19/14 1917     Chief Complaint  Patient presents with  . Chest Pain      HPI  Vision presents for evaluation with chief complaint of chest pressure. Left sided chest pressure this morning. Resolved after few hours. Proximal with report oxygen and recurred. States he's had intermittent episodes like this before. More prominent over the last week. Saw his primary care physician today had an EKG obtained. Has a known bundle branch block. Given nitroglycerin and transferred in route. Intermittent relief with nitroglycerin. Arrives here with persistence of left-sided chest pressure.  History of permanent A. fib anticoagulated with Coumadin. His never had any diagnosis of known coronary artery disease. EP cardiologist is Dr. Rayann Heman.  She given multiple nitroglycerin and morphine en route by paramedics pain improved from a 10 down to a 5.  Past Medical History  Diagnosis Date  . History of upper gastrointestinal hemorrhage     dr. Maurene Capes, GI  . Cerebrovascular accident 46  . Gastric polyp   . Dysphagia, unspecified(787.20)   . Coronary artery disease     a. Nonobstructive by cath 2009.  [name] Permanent atrial fibrillation     INR followed at [facility].  [name] Hypertension   . Esophagitis   . GERD (gastroesophageal reflux disease)   . Erosive gastritis     H/o GIB felt secondary to this  . Gastric ulcer   . Hiatal hernia   . Internal hemorrhoids   . Hearing impairment   . Vision problems   . Chronic bronchitis     a. h/o resp failure in setting of bronchitis vs PNA 05/2012.  [name] Hyperlipidemia   . ASD (atrial septal defect)     a. s/p repair 1983.  [name] BPH (benign prostatic hyperplasia)   . COPD (chronic obstructive pulmonary disease)   . Anemia   . Depression   . LV dysfunction     a. EF 45-50% by echo 05/2012.  [name] Chronic diarrhea    Past Surgical History  Procedure Laterality Date   . Cholecystectomy  1990s  . Asd repair  [id number]  . Inguinal hernia repair  1963  . Partial small bowel resection  2005    ischemic?  [name] Enteroscopy  05/13/2012    Procedure: ENTEROSCOPY;  Surgeon: Inda Castle, MD;  Location: [facility];  Service: Endoscopy;  Laterality: N/A;  . Cardioversion      possibly 3 times, dr. brody/cooper   Family History  Problem Relation Age of Onset  . Heart disease Mother   . Heart disease Father   . Prostate cancer Brother    History  Substance Use Topics  . Smoking status: Former Smoker -- 1.00 packs/day for 20 years    Types: Cigarettes    Quit date: 06/13/1983  . Smokeless tobacco: Never Used  . Alcohol Use: No    Review of Systems  Constitutional: Negative for fever, chills, diaphoresis, appetite change and fatigue.  HENT: Negative for mouth sores, sore throat and trouble swallowing.   Eyes: Negative for visual disturbance.  Respiratory: Positive for chest tightness and shortness of breath. Negative for cough and wheezing.   Cardiovascular: Negative for chest pain.  Gastrointestinal: Negative for nausea, vomiting, abdominal pain, diarrhea and abdominal distention.  Endocrine: Negative for polydipsia, polyphagia and polyuria.  Genitourinary: Negative for dysuria, frequency and hematuria.  Musculoskeletal: Negative for gait problem.  Skin:  Negative for color change, pallor and rash.  Neurological: Negative for dizziness, syncope, light-headedness and headaches.  Hematological: Does not bruise/bleed easily.  Psychiatric/Behavioral: Negative for behavioral problems and confusion.      Allergies  Penicillins  Home Medications   Prior to Admission medications   Medication Sig Start Date End Date Taking? Authorizing Provider  AVODART 0.5 MG capsule TAKE (1) CAPSULE DAILY   Yes [name], MD  Cyanocobalamin (VITAMIN B 12 PO) Take 500 mg by mouth daily. 1 tablet twice daily   Yes Historical Provider, MD  FLORASTOR 250 MG  capsule TAKE  (1)  CAPSULE  TWICE DAILY.   Yes [name], MD  furosemide (LASIX) 20 MG tablet TAKE  (1)  TABLET TWICE A DAY. Patient taking differently: TAKE  (1)  TABLET BY MOUTH AS NEEDED FOR EDEMA   Yes [name], MD  gabapentin (NEURONTIN) 600 MG tablet TAKE (1) TABLET THREE TIMES DAILY. Patient taking differently: TAKE (1) TABLET BY MOUTH TWICE DAILY. 06/10/14  Yes [name], MD  ipratropium (ATROVENT) 0.02 % nebulizer solution NEBULIZE 1 VAIL 4 TIMES A DAY AS DIRECTED 05/02/14  Yes Tanda Rockers, MD  levalbuterol (XOPENEX) 0.63 MG/3ML nebulizer solution NEBULIZE 1 VIAL (WITH IPRATROPIUM) TWICE A DAY AS NEEDED. MAY USE EXTR A EVERY 4 HOURS IF NEEDED 06/20/14  Yes [name], MD  mirtazapine (REMERON) 15 MG tablet Take 1 tablet (15 mg total) by mouth daily. Patient taking differently: Take 15 mg by mouth at bedtime.  05/03/14  Yes [name], MD  pantoprazole (PROTONIX) 40 MG tablet TAKE (1) TABLET TWICE A DAY BEFORE MEALS. 04/25/14  Yes [name], MD  potassium chloride (K-DUR) 10 MEQ tablet TAKE (2) TABLETS TWICE DAILY. 04/25/14  Yes [name], MD  simvastatin (ZOCOR) 20 MG tablet TAKE ONE TABLET AT BEDTIME 02/10/14  Yes [name], MD  warfarin (COUMADIN) 1 MG tablet Take as directed by anticoagulation clinic Patient taking differently: Take 2-3 mg by mouth daily at 6 PM. TAKES 2MG  ON MON, WED AND FRI  TAKES 3MG  ALL OTHER DAYS 08/02/14  Yes [name], MD  albuterol (PROVENTIL HFA;VENTOLIN HFA) 108 (90 BASE) MCG/ACT inhaler Inhale 2 puffs into the lungs every 6 (six) hours as needed for wheezing or shortness of breath. 04/27/14   [name], MD   BP 143/62 mmHg  Pulse 62  Temp(Src) 98.1 F (36.7 C) (Oral)  Resp 20  Ht 6' (1.829 m)  SpO2 97% Physical Exam  Constitutional: He is oriented to person, place, and time. He appears well-developed and well-nourished. No distress.  HENT:  Head: Normocephalic.  Eyes:  Conjunctivae are normal. Pupils are equal, round, and reactive to light. No scleral icterus.  Neck: Normal range of motion. Neck supple. No thyromegaly present.  Cardiovascular: Normal rate and regular rhythm.  Exam reveals no gallop and no friction rub.   No murmur heard. Pulmonary/Chest: Effort normal and breath sounds normal. No respiratory distress. He has no wheezes. He has no rales.  Abdominal: Soft. Bowel sounds are normal. He exhibits no distension. There is no tenderness. There is no rebound.  Musculoskeletal: Normal range of motion.  Neurological: He is alert and oriented to person, place, and time.  Skin: Skin is warm and dry. No rash noted.  Psychiatric: He has a normal mood and affect. His behavior is normal.    ED Course  Procedures (including critical care time) Labs Review Labs Reviewed  CBC - Abnormal; Notable for the following:    Hemoglobin 11.7 (*)    HCT 37.6 (*)    Platelets 126 (*)    All other components within normal limits  BASIC METABOLIC PANEL - Abnormal; Notable for the following:    Glucose, Bld 146 (*)    Calcium 8.3 (*)    GFR calc non Af Amer 63 (*)    GFR calc Af Amer 73 (*)    All other components within normal limits  TROPONIN I  PROTIME-INR  I-STAT TROPOININ, ED    Imaging Review Dg Chest Port 1 View  08/19/2014   CLINICAL DATA:  Shortness of breath, left chest pain  EXAM: PORTABLE CHEST - 1 VIEW  COMPARISON:  11/16/2012  FINDINGS: Chronic interstitial markings/ emphysematous changes. No focal consolidation. No frank interstitial edema. Left lung base is obscured. No pneumothorax.  The heart is top-normal in size.  IMPRESSION: No evidence of acute cardiopulmonary disease.   Electronically Signed   By: Julian Hy M.D.   On: 08/19/2014 19:28     EKG Interpretation   Date/Time:  Friday August 19 2014 20:44:37 EDT Ventricular Rate:  77 PR Interval:    QRS Duration: 129 QT Interval:  439 QTC Calculation: 497 R Axis:   97 Text  Interpretation:  Atrial fibrillation Paired ventricular premature  complexes Right bundle branch block Confirmed by Jeneen Rinks  MD, [place] ([id number])  on 08/19/2014 10:31:23 PM      MDM   Final diagnoses:  Chest pain, unspecified chest pain type    Sent for chest pain. Bundle-branch block without change. First troponin negative. Improving with nitroglycerin. Placed on nitro drip. Also given 4 mg IV morphine. Becomes pain-free for the first time since 4:00. Care discussed with CHF  cardiology fellow on call. Patient to be be admitted.    Tanna Furry, MD 08/19/14 2236

## 2014-08-19 NOTE — Progress Notes (Signed)
   Subjective:    Patient ID: [name], male    DOB: [birth year], 81 y.o.   MRN: [id number]  HPI Patient in c/o chest pain that started about 10 minutes prior to arrival- He was sob and diaphoretic- described pain as crushing and rated 10/10/    Review of Systems  Constitutional: Negative.   HENT: Negative.   Respiratory: Positive for shortness of breath.   Cardiovascular: Positive for chest pain.  Gastrointestinal: Negative.   Genitourinary: Negative.   Neurological: Negative.   Psychiatric/Behavioral: Negative.   All other systems reviewed and are negative.      Objective:   Physical Exam  Constitutional: He is oriented to person, place, and time. He appears well-developed and well-nourished.  Cardiovascular: Normal rate and normal heart sounds.   EKG- RBBB with st depression in lead v2 and v3  Pulmonary/Chest: Effort normal and breath sounds normal.  Neurological: He is alert and oriented to person, place, and time.  Skin: Skin is warm.  Psychiatric: He has a normal mood and affect. His behavior is normal. Judgment and thought content normal.   5:25- sao2 95% room air  210 /92 p-94 R- 20 pain 10/10 5:30- niitro sl X1- Iv NS KVO- 18g right forearm 5:35- pain 10/10- nitro sl- EMS arrived       Assessment & Plan:   1. Chest pain, unspecified chest pain type    Transporter to [facility] via [facility] Report given to transporters  Mary-Margaret Hassell Done, FNP

## 2014-08-19 NOTE — ED Notes (Signed)
Patient seen at a family practice sent here because of active Chest pain. Patient denies any pain now. Patient recived 324 ASA a total of 5 Nitro (3 with EMS ) and (two with family practice) patient also received 10 mg of morphine.

## 2014-08-19 NOTE — ED Notes (Signed)
Patient refused pain medication at this time states he will let me know when he needs medication.

## 2014-08-20 DIAGNOSIS — R079 Chest pain, unspecified: Secondary | ICD-10-CM

## 2014-08-20 DIAGNOSIS — R072 Precordial pain: Secondary | ICD-10-CM

## 2014-08-20 DIAGNOSIS — I4891 Unspecified atrial fibrillation: Secondary | ICD-10-CM

## 2014-08-20 LAB — PROTIME-INR
INR: 2.19 — ABNORMAL HIGH (ref 0.00–1.49)
Prothrombin Time: 24.5 seconds — ABNORMAL HIGH (ref 11.6–15.2)

## 2014-08-20 LAB — LIPID PANEL
Cholesterol: 92 mg/dL (ref 0–200)
HDL: 38 mg/dL — AB (ref >39)
LDL CALC: 33 mg/dL (ref 0–99)
Total CHOL/HDL Ratio: 2.4 RATIO
Triglycerides: 105 mg/dL (ref <150)
VLDL: 21 mg/dL (ref 0–40)

## 2014-08-20 LAB — MRSA PCR SCREENING: MRSA by PCR: POSITIVE — AB

## 2014-08-20 LAB — CBC
HCT: 36.9 % — ABNORMAL LOW (ref 39.0–52.0)
Hemoglobin: 11.4 g/dL — ABNORMAL LOW (ref 13.0–17.0)
MCH: 27.6 pg (ref 26.0–34.0)
MCHC: 30.9 g/dL (ref 30.0–36.0)
MCV: 89.3 fL (ref 78.0–100.0)
PLATELETS: 139 10*3/uL — AB (ref 150–400)
RBC: 4.13 MIL/uL — ABNORMAL LOW (ref 4.22–5.81)
RDW: 14.8 % (ref 11.5–15.5)
WBC: 5.5 10*3/uL (ref 4.0–10.5)

## 2014-08-20 LAB — TROPONIN I
Troponin I: <0.03 ng/mL (ref <0.031)
Troponin I: <0.03 ng/mL (ref <0.031)

## 2014-08-20 LAB — BASIC METABOLIC PANEL
ANION GAP: 5 (ref 5–15)
BUN: 11 mg/dL (ref 6–23)
CALCIUM: 8.6 mg/dL (ref 8.4–10.5)
CHLORIDE: 108 mmol/L (ref 96–112)
CO2: 28 mmol/L (ref 19–32)
Creatinine, Ser: 1.11 mg/dL (ref 0.50–1.35)
GFR calc Af Amer: 70 mL/min — ABNORMAL LOW (ref >90)
GFR, EST NON AFRICAN AMERICAN: 61 mL/min — AB (ref >90)
Glucose, Bld: 110 mg/dL — ABNORMAL HIGH (ref 70–99)
Potassium: 3.7 mmol/L (ref 3.5–5.1)
SODIUM: 141 mmol/L (ref 135–145)

## 2014-08-20 LAB — GLUCOSE, CAPILLARY: Glucose-Capillary: 96 mg/dL (ref 70–99)

## 2014-08-20 MED ORDER — WARFARIN SODIUM 3 MG PO TABS
3.0000 mg | ORAL_TABLET | ORAL | Status: DC
Start: 2014-08-20 — End: 2014-08-20
  Filled 2014-08-20: qty 1

## 2014-08-20 MED ORDER — WARFARIN - PHYSICIAN DOSING INPATIENT: Freq: Every day | Status: DC

## 2014-08-20 MED ORDER — NITROGLYCERIN IN D5W 200-5 MCG/ML-% IV SOLN
0.0000 ug/min | INTRAVENOUS | Status: DC
  Administered 2014-08-20: 50 ug/min via INTRAVENOUS

## 2014-08-20 MED ORDER — ISOSORBIDE MONONITRATE ER 30 MG PO TB24: 30.0000 mg | ORAL_TABLET | Freq: Every day | ORAL | Status: DC

## 2014-08-20 MED ORDER — GABAPENTIN 600 MG PO TABS
600.0000 mg | ORAL_TABLET | Freq: Two times a day (BID) | ORAL | Status: DC
  Administered 2014-08-20: 600 mg via ORAL
  Filled 2014-08-20 (×2): qty 1

## 2014-08-20 MED ORDER — WARFARIN SODIUM 2 MG PO TABS: 2.0000 mg | ORAL_TABLET | ORAL | Status: DC

## 2014-08-20 MED ORDER — FUROSEMIDE 20 MG PO TABS: ORAL_TABLET | ORAL | Status: DC

## 2014-08-20 MED ORDER — HYDRALAZINE HCL 25 MG PO TABS
25.0000 mg | ORAL_TABLET | Freq: Once | ORAL | Status: AC
  Administered 2014-08-20: 25 mg via ORAL
  Filled 2014-08-20: qty 1

## 2014-08-20 MED ORDER — WARFARIN SODIUM 1 MG PO TABS: 2.0000 mg | ORAL_TABLET | Freq: Every day | ORAL | Status: DC

## 2014-08-20 MED ORDER — ACETAMINOPHEN 325 MG PO TABS
650.0000 mg | ORAL_TABLET | ORAL | Status: DC | PRN
  Administered 2014-08-20: 650 mg via ORAL
  Filled 2014-08-20: qty 2

## 2014-08-20 MED ORDER — DUTASTERIDE 0.5 MG PO CAPS
0.5000 mg | ORAL_CAPSULE | Freq: Every day | ORAL | Status: DC
  Administered 2014-08-20: 0.5 mg via ORAL
  Filled 2014-08-20: qty 1

## 2014-08-20 MED ORDER — ALBUTEROL SULFATE (2.5 MG/3ML) 0.083% IN NEBU: 2.5000 mg | INHALATION_SOLUTION | Freq: Four times a day (QID) | RESPIRATORY_TRACT | Status: DC | PRN

## 2014-08-20 MED ORDER — ALBUTEROL SULFATE HFA 108 (90 BASE) MCG/ACT IN AERS: 2.0000 | INHALATION_SPRAY | Freq: Four times a day (QID) | RESPIRATORY_TRACT | Status: DC | PRN

## 2014-08-20 MED ORDER — GABAPENTIN 600 MG PO TABS: ORAL_TABLET | ORAL | Status: DC

## 2014-08-20 MED ORDER — NITROGLYCERIN 0.4 MG SL SUBL: 0.4000 mg | SUBLINGUAL_TABLET | SUBLINGUAL | Status: AC | PRN

## 2014-08-20 MED ORDER — IPRATROPIUM BROMIDE 0.02 % IN SOLN: 0.5000 mg | Freq: Four times a day (QID) | RESPIRATORY_TRACT | Status: DC | PRN

## 2014-08-20 MED ORDER — CHLORHEXIDINE GLUCONATE CLOTH 2 % EX PADS
6.0000 | MEDICATED_PAD | Freq: Every day | CUTANEOUS | Status: DC
  Administered 2014-08-20: 6 via TOPICAL

## 2014-08-20 MED ORDER — SACCHAROMYCES BOULARDII 250 MG PO CAPS
250.0000 mg | ORAL_CAPSULE | Freq: Two times a day (BID) | ORAL | Status: DC
  Administered 2014-08-20: 250 mg via ORAL
  Filled 2014-08-20 (×2): qty 1

## 2014-08-20 MED ORDER — SIMVASTATIN 20 MG PO TABS
20.0000 mg | ORAL_TABLET | Freq: Every day | ORAL | Status: DC
  Filled 2014-08-20: qty 1

## 2014-08-20 MED ORDER — ONDANSETRON HCL 4 MG/2ML IJ SOLN: 4.0000 mg | Freq: Four times a day (QID) | INTRAMUSCULAR | Status: DC | PRN

## 2014-08-20 MED ORDER — MUPIROCIN 2 % EX OINT
1.0000 "application " | TOPICAL_OINTMENT | Freq: Two times a day (BID) | CUTANEOUS | Status: DC
  Filled 2014-08-20: qty 22

## 2014-08-20 MED ORDER — PANTOPRAZOLE SODIUM 40 MG PO TBEC
40.0000 mg | DELAYED_RELEASE_TABLET | Freq: Two times a day (BID) | ORAL | Status: DC
  Administered 2014-08-20: 40 mg via ORAL
  Filled 2014-08-20: qty 1

## 2014-08-20 MED ORDER — NITROGLYCERIN 0.4 MG SL SUBL: 0.4000 mg | SUBLINGUAL_TABLET | SUBLINGUAL | Status: DC | PRN

## 2014-08-20 MED ORDER — MORPHINE SULFATE 2 MG/ML IJ SOLN
2.0000 mg | Freq: Once | INTRAMUSCULAR | Status: AC
  Administered 2014-08-20: 2 mg via INTRAVENOUS
  Filled 2014-08-20: qty 1

## 2014-08-20 MED ORDER — ATORVASTATIN CALCIUM 80 MG PO TABS: 80.0000 mg | ORAL_TABLET | Freq: Every day | ORAL | Status: DC

## 2014-08-20 MED ORDER — ENOXAPARIN SODIUM 40 MG/0.4ML ~~LOC~~ SOLN: 40.0000 mg | SUBCUTANEOUS | Status: DC

## 2014-08-20 MED ORDER — MIRTAZAPINE 15 MG PO TABS: 15.0000 mg | ORAL_TABLET | Freq: Every day | ORAL | Status: DC

## 2014-08-20 MED ORDER — MIRTAZAPINE 15 MG PO TABS
15.0000 mg | ORAL_TABLET | Freq: Every day | ORAL | Status: DC
  Administered 2014-08-20: 15 mg via ORAL
  Filled 2014-08-20 (×2): qty 1

## 2014-08-20 MED ORDER — ASPIRIN EC 81 MG PO TBEC: 81.0000 mg | DELAYED_RELEASE_TABLET | Freq: Every day | ORAL | Status: DC

## 2014-08-20 NOTE — Significant Event (Signed)
Rapid Response Event Note Pt admitted with CP this evening. Upon admit pt was CP free. This morning Patient called out having severe pain. NTG gtt currently found infusing at 50 Mcg/min.  Overview: Time Called: 0223 Arrival Time: 0225 Event Type: Cardiac  Initial Focused Assessment: Pt found resting quietly in bed. Complains on mild chest pain at this time 5/10. Per pt he has had this pain "Since I got here" and pain is less now than it was prior to my arrival. Pain is in mid chest and does not radiate. Pt able to move both arms weakly. Awake follows commands, denies pain elsewhere. He does admit to numbness and weakness in both legs. Per wife he has this complaint often and he takes Neurontin for leg neuropathy. No SOB, 98% on 2 LNC,HR 75 bpm, BP 142/75  Interventions: EKG completed upon my arrival. Afib with PVCs and R BBB, no acute changes seen in comparison with earlier EKG. 2mg  morphine given for pain IVP at 0234.   As of 0310 pt denies chest pain and is resting. BP 117/53 , HR 54.  Event Summary: Dr. Oval Linsey paged per floor RN, call back received and MD updated on pt status and EKG completed. Orders received to keep NTG at 50 mcg and titrate as needed for CP.     at    Outcome: Stayed in room and stabalized     White, [employer]

## 2014-08-20 NOTE — Discharge Summary (Signed)
Discharge Summary   Patient ID: [name],  MRN: [id number], DOB/AGE: 79/21/1934 79 y.o.  Admit date: 08/19/2014 Discharge date: 08/20/2014  Primary Care Provider: [name] Primary Cardiologist: J. Allred, MD   Discharge Diagnoses Principal Problem:   Chest pain Active Problems:   Essential hypertension   Atrial fibrillation with bradycardia   GERD (gastroesophageal reflux disease)   Stage III chronic kidney disease   Hyperlipidemia   COPD GOLD III   Anemia of chronic disease   Allergies Allergies  Allergen Reactions  . Penicillins Hives    Procedures  None  History of Present Illness  81 year old male with a prior history of persistent atrial fibrillation on chronic Coumadin anticoagulation.  He also has a history of nonobstructive coronary artery disease, COPD, GERD, stage III kidney disease, and prior history of GI bleed.  He was in his usual state of health until March 18, when he developed left-sided chest pressure while sitting at home. [facility] was seen [facility] where ECG showed right bundle branch block. EMS was called and he was taken to the [facility] ED for further evaluation. There, troponin was normal and he was chest pain-free. He was admitted for further evaluation.  Hospital Course  Patient ruled out for myocardial infarction.  He had no further chest pain. He insisted on being discharged today. He is not interested in further evaluation at this time. We will discharge him today in satisfactory condition and have sent in prescriptions for long-acting nitroglycerin as well as short acting nitroglycerin glycerin. We will arrange for office follow-up within the next 2-3 weeks.  Discharge Vitals Blood pressure 146/77, pulse 85, temperature 98.7 F (37.1 C), temperature source Oral, resp. rate 16, height 5\' 11"  (1.803 m), weight 167 lb (75.751 kg), SpO2 96 %.  Filed Weights   08/19/14 2320  Weight: 167 lb (75.751 kg)     Labs  CBC  Recent Labs  08/19/14 1908 08/20/14 0540  WBC 4.2 5.5  HGB 11.7* 11.4*  HCT 37.6* 36.9*  MCV 88.5 89.3  PLT 126* 542*   Basic Metabolic Panel  Recent Labs  08/19/14 1908 08/20/14 0540  NA 142 141  K 3.9 3.7  CL 108 108  CO2 27 28  GLUCOSE 146* 110*  BUN 11 11  CREATININE 1.08 1.11  CALCIUM 8.3* 8.6   Cardiac Enzymes  Recent Labs  08/19/14 2103 08/20/14 0042 08/20/14 0540  TROPONINI <0.03 <0.03 <0.03   Fasting Lipid Panel  Recent Labs  08/20/14 0331  CHOL 92  HDL 38*  LDLCALC 33  TRIG 105  CHOLHDL 2.4   Disposition  Pt is being discharged home today in good condition.  Follow-up Plans & Appointments      Follow-up Information    Follow up with Thompson Grayer, MD.   Specialty:  Cardiology   Why:  2-3 wks   Contact information:   [address] [place] [address] [phone]       Follow up with [name], MD.   Specialty:  Family Medicine   Why:  as scheduled.   Contact information:   [place] [address] [phone]       Discharge Medications    Medication List    TAKE these medications        albuterol 108 (90 BASE) MCG/ACT inhaler  Commonly known as:  PROVENTIL HFA;VENTOLIN HFA  Inhale 2 puffs into the lungs every 6 (six) hours as needed  for wheezing or shortness of breath.     AVODART 0.5 MG capsule  Generic drug:  dutasteride  TAKE (1) CAPSULE DAILY     FLORASTOR 250 MG capsule  Generic drug:  saccharomyces boulardii  TAKE  (1)  CAPSULE  TWICE DAILY.     furosemide 20 MG tablet  Commonly known as:  LASIX  TAKE  (1)  TABLET BY MOUTH AS NEEDED FOR EDEMA     gabapentin 600 MG tablet  Commonly known as:  NEURONTIN  TAKE (1) TABLET BY MOUTH TWICE DAILY.     ipratropium 0.02 % nebulizer solution  Commonly known as:  ATROVENT  NEBULIZE 1 VAIL 4 TIMES A DAY AS DIRECTED     isosorbide mononitrate 30 MG 24 hr tablet  Commonly known as:  IMDUR  Take 1  tablet (30 mg total) by mouth daily.     levalbuterol 0.63 MG/3ML nebulizer solution  Commonly known as:  XOPENEX  NEBULIZE 1 VIAL (WITH IPRATROPIUM) TWICE A DAY AS NEEDED. MAY USE EXTR A EVERY 4 HOURS IF NEEDED     mirtazapine 15 MG tablet  Commonly known as:  REMERON  Take 1 tablet (15 mg total) by mouth at bedtime.     nitroGLYCERIN 0.4 MG SL tablet  Commonly known as:  NITROSTAT  Place 1 tablet (0.4 mg total) under the tongue every 5 (five) minutes x 3 doses as needed for chest pain.     pantoprazole 40 MG tablet  Commonly known as:  PROTONIX  TAKE (1) TABLET TWICE A DAY BEFORE MEALS.     potassium chloride 10 MEQ tablet  Commonly known as:  K-DUR  TAKE (2) TABLETS TWICE DAILY.     simvastatin 20 MG tablet  Commonly known as:  ZOCOR  TAKE ONE TABLET AT BEDTIME     VITAMIN B 12 PO  Take 500 mg by mouth daily. 1 tablet twice daily     warfarin 1 MG tablet  Commonly known as:  COUMADIN  - Take 2-3 tablets (2-3 mg total) by mouth daily at 6 PM. TAKES 2MG  ON MON, WED AND FRI   - TAKES 3MG  ALL OTHER DAYS        Outstanding Labs/Studies  None  Duration of Discharge Encounter   Greater than 30 minutes including physician time.  Signed, Murray Hodgkins NP 08/20/2014, 12:10 PM Patient seen and examined. I agree with the assessment and plan as detailed above. See also my additional thoughts below.   Please see my progress note from today also. Patient was admitted after midnight. It seems that his pain was nitroglycerin responsive. His enzymes have been negative. My preference is to keep the patient in the hospital for further evaluation. The patient adamantly refuses to stay. He insists on going home. His family member in the room is in agreement. We will add nitroglycerin to his home medications. We will arrange for post hospital follow-up.  Dola Argyle, MD, [facility] 08/20/2014 12:25 PM

## 2014-08-20 NOTE — Progress Notes (Signed)
The patient was admitted by our team after midnight. He had chest discomfort. It appeared nitroglycerin responsive. Enzymes so far are negative. The patient is absolutely adamant that he wants to go home now. I spoke with the patient and his family member about this. He understands that we're concerned about his current chest pain. He wants to go home and see what happens. We are turning off his IV nitroglycerin. He'll be started on an oral form of nitroglycerin. We will discharge him home.  I have urged the patient to remain in the hospital for further evaluation of his cardiac status. He absolutely refuses and wants to go home now.  Daryel November, MD

## 2014-08-20 NOTE — H&P (Signed)
HPI: [name] is an 75M with paroxysmal AF, HTN, COPD, and GIB here with CP.  He developed 5/10 L sided chest pressure while sitting at home.  He presented to urgent care where he received SL NTG that reportedly helped his pain somewhat.  Of note, per the urgent care note he was diaphoretic and reported 10/10 crushing CP that was not relieved with NTG.  EMS was called and the CP persisted to the hospital until he was started on a NG infusion.  EKG was negative for ischemic changes and the first set of cardiac enzymes was wnl.  [name] denies SOB, n/v or diaphoresis.  He has experienced CP in the past and both stress tests and caths at [facility], though I am unable to find this in the record.  He denies any prior stent placement.  [name] is currently CP free.   Review of Systems:     Cardiac Review of Systems: {Y] = yes [ ]  = no  Chest Pain [ x   ]  Resting SOB [   ] Exertional SOB  [  ]  Orthopnea [  ]   Pedal Edema [   ]    Palpitations [  ] Syncope  [  ]   Presyncope [   ]  General Review of Systems: [Y] = yes [  ]=no Constitional: recent weight change [  ]; anorexia [x ]; fatigue [x  ]; nausea [  ]; night sweats [  ]; fever [  ]; or chills [  ];                                                                                                                                          Dental: poor dentition[  ];   Eye : blurred vision [  ]; diplopia [   ]; vision changes [  ];  Amaurosis fugax[  ]; Resp: cough [  ];  wheezing[  ];  hemoptysis[  ]; shortness of breath[  ]; paroxysmal nocturnal dyspnea[  ]; dyspnea on exertion[  ]; or orthopnea[  ];  GI:  gallstones[  ], vomiting[  ];  dysphagia[  ]; melena[  ];  hematochezia [  ]; heartburn[  ];   Hx of  Colonoscopy[  ]; GU: kidney stones [  ]; hematuria[  ];   dysuria [  ];  nocturia[  ];  history of     obstruction [  ];                 Skin: rash, swelling[  ];, hair loss[  ];  peripheral edema[  ];  or itching[  ]; Musculosketetal:  myalgias[  ];  joint swelling[  ];  joint erythema[  ];  joint pain[  ];  back pain[  ];  Heme/Lymph: bruising[  ];  bleeding[  ];  anemia[  ];  Neuro: TIA[  ];  headaches[  ];  stroke[  ];  vertigo[  ];  seizures[  ];   paresthesias[  ];  difficulty walking[  ];  Psych:depression[  ]; anxiety[  ];  Endocrine: diabetes[  ];  thyroid dysfunction[  ];  Immunizations: Flu [  ]; Pneumococcal[  ];  Other:  Past Medical History  Diagnosis Date  . History of upper gastrointestinal hemorrhage     dr. Maurene Capes, GI  . Cerebrovascular accident 21  . Gastric polyp   . Dysphagia, unspecified(787.20)   . Coronary artery disease     a. Nonobstructive by cath 2009.  [name] Permanent atrial fibrillation     INR followed at [facility].  [name] Hypertension   . Esophagitis   . GERD (gastroesophageal reflux disease)   . Erosive gastritis     H/o GIB felt secondary to this  . Gastric ulcer   . Hiatal hernia   . Internal hemorrhoids   . Hearing impairment   . Vision problems   . Chronic bronchitis     a. h/o resp failure in setting of bronchitis vs PNA 05/2012.  [name] Hyperlipidemia   . ASD (atrial septal defect)     a. s/p repair 1983.  [name] BPH (benign prostatic hyperplasia)   . COPD (chronic obstructive pulmonary disease)   . Anemia   . Depression   . LV dysfunction     a. EF 45-50% by echo 05/2012.  [name] Chronic diarrhea     Medications Prior to Admission  Medication Sig Dispense Refill  . AVODART 0.5 MG capsule TAKE (1) CAPSULE DAILY 30 capsule 5  . Cyanocobalamin (VITAMIN B 12 PO) Take 500 mg by mouth daily. 1 tablet twice daily    . FLORASTOR 250 MG capsule TAKE  (1)  CAPSULE  TWICE DAILY. 50 capsule 3  . furosemide (LASIX) 20 MG tablet TAKE  (1)  TABLET TWICE A DAY. (Patient taking differently: TAKE  (1)  TABLET BY MOUTH AS NEEDED FOR EDEMA) 180 tablet 2  . gabapentin (NEURONTIN) 600 MG tablet TAKE (1) TABLET THREE TIMES DAILY. (Patient taking differently: TAKE (1) TABLET BY MOUTH TWICE DAILY.) 90 tablet 2    . ipratropium (ATROVENT) 0.02 % nebulizer solution NEBULIZE 1 VAIL 4 TIMES A DAY AS DIRECTED 312.5 mL 0  . levalbuterol (XOPENEX) 0.63 MG/3ML nebulizer solution NEBULIZE 1 VIAL (WITH IPRATROPIUM) TWICE A DAY AS NEEDED. MAY USE EXTR A EVERY 4 HOURS IF NEEDED 216 mL 5  . mirtazapine (REMERON) 15 MG tablet Take 1 tablet (15 mg total) by mouth daily. (Patient taking differently: Take 15 mg by mouth at bedtime. ) 90 tablet 2  . pantoprazole (PROTONIX) 40 MG tablet TAKE (1) TABLET TWICE A DAY BEFORE MEALS. 60 tablet 2  . potassium chloride (K-DUR) 10 MEQ tablet TAKE (2) TABLETS TWICE DAILY. 120 tablet 2  . simvastatin (ZOCOR) 20 MG tablet TAKE ONE TABLET AT BEDTIME 30 tablet 11  . warfarin (COUMADIN) 1 MG tablet Take as directed by anticoagulation clinic (Patient taking differently: Take 2-3 mg by mouth daily at 6 PM. TAKES 2MG  ON MON, WED AND FRI  TAKES 3MG  ALL OTHER DAYS) 80 tablet 3  . albuterol (PROVENTIL HFA;VENTOLIN HFA) 108 (90 BASE) MCG/ACT inhaler Inhale 2 puffs into the lungs every 6 (six) hours as needed for wheezing or shortness of breath. 1 Inhaler 2     Allergies  Allergen Reactions  . Penicillins Hives    History   Social History  . Marital  Status: Married    Spouse Name: N/A  . Number of Children: N/A  . Years of Education: N/A   Occupational History  . Not on file.   Social History Main Topics  . Smoking status: Former Smoker -- 1.00 packs/day for 20 years    Types: Cigarettes    Quit date: 06/13/1983  . Smokeless tobacco: Never Used  . Alcohol Use: No  . Drug Use: No  . Sexual Activity: No   Other Topics Concern  . Not on file   Social History Narrative   Lives in [place] with wife.  Several falls recently, 4 in the last month.        [name]  [id number]                Family History  Problem Relation Age of Onset  . Heart disease Mother   . Heart disease Father   . Prostate cancer Brother     PHYSICAL EXAM: Filed Vitals:   08/19/14  2320  BP: 184/88  Pulse: 70  Temp: 98.1 F (36.7 C)  Resp: 18   General:  Well appearing. No respiratory difficulty HEENT: normal Neck: supple. no JVD. Carotids 2+ bilat; no bruits. No lymphadenopathy or thryomegaly appreciated. Cor: PMI nondisplaced. Irregularly irregular. No rubs, gallops or murmurs. Lungs: mild expiratory wheezing.  Abdomen: soft, nontender, nondistended. No hepatosplenomegaly. No bruits or masses. Good bowel sounds. Extremities: no cyanosis, clubbing, rash, edema Neuro: alert & oriented x 3, cranial nerves grossly intact. moves all 4 extremities w/o difficulty. Affect pleasant.  ECG: Atrial fibrillation with ventricular rate of 77.  RBBB. R axis deviation.  Results for orders placed or performed during the hospital encounter of 08/19/14 (from the past 24 hour(s))  CBC     Status: Abnormal   Collection Time: 08/19/14  7:08 PM  Result Value Ref Range   WBC 4.2 4.0 - 10.5 K/uL   RBC 4.25 4.22 - 5.81 MIL/uL   Hemoglobin 11.7 (L) 13.0 - 17.0 g/dL   HCT 37.6 (L) 39.0 - 52.0 %   MCV 88.5 78.0 - 100.0 fL   MCH 27.5 26.0 - 34.0 pg   MCHC 31.1 30.0 - 36.0 g/dL   RDW 14.6 11.5 - 15.5 %   Platelets 126 (L) 150 - 400 K/uL  Basic metabolic panel     Status: Abnormal   Collection Time: 08/19/14  7:08 PM  Result Value Ref Range   Sodium 142 135 - 145 mmol/L   Potassium 3.9 3.5 - 5.1 mmol/L   Chloride 108 96 - 112 mmol/L   CO2 27 19 - 32 mmol/L   Glucose, Bld 146 (H) 70 - 99 mg/dL   BUN 11 6 - 23 mg/dL   Creatinine, Ser 1.08 0.50 - 1.35 mg/dL   Calcium 8.3 (L) 8.4 - 10.5 mg/dL   GFR calc non Af Amer 63 (L) >90 mL/min   GFR calc Af Amer 73 (L) >90 mL/min   Anion gap 7 5 - 15  Troponin I     Status: None   Collection Time: 08/19/14  9:03 PM  Result Value Ref Range   Troponin I <0.03 <0.031 ng/mL  I-stat troponin, ED (not at [facility])     Status: None   Collection Time: 08/19/14  9:05 PM  Result Value Ref Range   Troponin i, poc 0.01 0.00 - 0.08 ng/mL   Comment 3           Protime-INR     Status: Abnormal  Collection Time: 08/19/14 10:30 PM  Result Value Ref Range   Prothrombin Time 26.2 (H) 11.6 - 15.2 seconds   INR 2.38 (H) 0.00 - 1.49   Dg Chest Port 1 View  08/19/2014   CLINICAL DATA:  Shortness of breath, left chest pain  EXAM: PORTABLE CHEST - 1 VIEW  COMPARISON:  11/16/2012  FINDINGS: Chronic interstitial markings/ emphysematous changes. No focal consolidation. No frank interstitial edema. Left lung base is obscured. No pneumothorax.  The heart is top-normal in size.  IMPRESSION: No evidence of acute cardiopulmonary disease.   Electronically Signed   By: Julian Hy M.D.   On: 08/19/2014 19:28     ASSESSMENT: 8M with paroxysmal AF, HTN, COPD, and GIB here with CP.     PLAN/DISCUSSION: # CP: Currently CP free on NTG infusion.  Story is concerning for ischemic CP, especially given his age. - Repeat troponin x2 - Continue ASA 81mg  daily - Continue home statin.  If pt has coronary disease will switch to a more cardioprotective statin - No beta blocker given tachy/brady syndrome and HR currently in the 60s - BP control with hydralazine.  Pt not on any BP meds at baseline. - Likely Lexiscan Myoview in AM.  Pt does not think he can exercise for stress test.  # Atrial fibrillation: Patient in afib, though rates are controlled off nodal agents.  Given bradycardia, will not start any nodal agents. - Continue warfarin.  INR 2.4  # COPD: Stable, though mild wheezing on exam. - Continue home nebs and inhalers  # Code: Full

## 2014-08-20 NOTE — Progress Notes (Signed)
Called Dr. Ron Parker to let him know wife had called and wanted MD notified that her husband wanted to leave. Pt stated he wanted to leave he is 62 and OK with "whatever" because he does not want to stay in hospital. Pt stated he wanted to go home and "see what happens". Pt resting with call bell within reach.  Will continue to monitor. Payton Emerald, RN

## 2014-08-20 NOTE — Progress Notes (Signed)
Pt/family given discharge instructions, medication lists, follow up appointments, and when to call the doctor.  Pt/family verbalizes understanding. Pt car parked at urgent care and security called to drive to car location. Payton Emerald, RN

## 2014-08-25 ENCOUNTER — Other Ambulatory Visit: Payer: Self-pay | Admitting: Family Medicine

## 2014-08-29 ENCOUNTER — Other Ambulatory Visit: Payer: Self-pay | Admitting: Family Medicine

## 2014-09-03 ENCOUNTER — Emergency Department (HOSPITAL_COMMUNITY): Payer: Medicare Other

## 2014-09-03 ENCOUNTER — Encounter (HOSPITAL_COMMUNITY): Payer: Self-pay | Admitting: *Deleted

## 2014-09-03 ENCOUNTER — Observation Stay (HOSPITAL_COMMUNITY)
Admission: EM | Admit: 2014-09-03 | Discharge: 2014-09-07 | Disposition: A | Discharge status: Home / Self Care | Payer: Medicare Other | Attending: Cardiology | Admitting: Cardiology

## 2014-09-03 DIAGNOSIS — E785 Hyperlipidemia, unspecified: Secondary | ICD-10-CM | POA: Insufficient documentation

## 2014-09-03 DIAGNOSIS — I482 Chronic atrial fibrillation, unspecified: Secondary | ICD-10-CM | POA: Diagnosis present

## 2014-09-03 DIAGNOSIS — K219 Gastro-esophageal reflux disease without esophagitis: Secondary | ICD-10-CM | POA: Insufficient documentation

## 2014-09-03 DIAGNOSIS — R0789 Other chest pain: Secondary | ICD-10-CM

## 2014-09-03 DIAGNOSIS — R9439 Abnormal result of other cardiovascular function study: Secondary | ICD-10-CM | POA: Insufficient documentation

## 2014-09-03 DIAGNOSIS — Z880 Allergy status to penicillin: Secondary | ICD-10-CM | POA: Diagnosis not present

## 2014-09-03 DIAGNOSIS — I2510 Atherosclerotic heart disease of native coronary artery without angina pectoris: Secondary | ICD-10-CM | POA: Diagnosis not present

## 2014-09-03 DIAGNOSIS — N400 Enlarged prostate without lower urinary tract symptoms: Secondary | ICD-10-CM | POA: Insufficient documentation

## 2014-09-03 DIAGNOSIS — F329 Major depressive disorder, single episode, unspecified: Secondary | ICD-10-CM | POA: Diagnosis not present

## 2014-09-03 DIAGNOSIS — Z8673 Personal history of transient ischemic attack (TIA), and cerebral infarction without residual deficits: Secondary | ICD-10-CM | POA: Insufficient documentation

## 2014-09-03 DIAGNOSIS — R079 Chest pain, unspecified: Secondary | ICD-10-CM

## 2014-09-03 DIAGNOSIS — Z7901 Long term (current) use of anticoagulants: Secondary | ICD-10-CM

## 2014-09-03 DIAGNOSIS — J449 Chronic obstructive pulmonary disease, unspecified: Secondary | ICD-10-CM | POA: Insufficient documentation

## 2014-09-03 DIAGNOSIS — Z87891 Personal history of nicotine dependence: Secondary | ICD-10-CM | POA: Insufficient documentation

## 2014-09-03 DIAGNOSIS — I10 Essential (primary) hypertension: Secondary | ICD-10-CM | POA: Insufficient documentation

## 2014-09-03 LAB — CBC WITH DIFFERENTIAL/PLATELET
BASOS ABS: 0 10*3/uL (ref 0.0–0.1)
Basophils Relative: 1 % (ref 0–1)
Eosinophils Absolute: 0.1 10*3/uL (ref 0.0–0.7)
Eosinophils Relative: 2 % (ref 0–5)
HCT: 40.2 % (ref 39.0–52.0)
HEMOGLOBIN: 12.3 g/dL — AB (ref 13.0–17.0)
Lymphocytes Relative: 22 % (ref 12–46)
Lymphs Abs: 1.2 10*3/uL (ref 0.7–4.0)
MCH: 27.3 pg (ref 26.0–34.0)
MCHC: 30.6 g/dL (ref 30.0–36.0)
MCV: 89.1 fL (ref 78.0–100.0)
MONO ABS: 0.7 10*3/uL (ref 0.1–1.0)
Monocytes Relative: 11 % (ref 3–12)
NEUTROS PCT: 64 % (ref 43–77)
Neutro Abs: 3.7 10*3/uL (ref 1.7–7.7)
Platelets: 169 10*3/uL (ref 150–400)
RBC: 4.51 MIL/uL (ref 4.22–5.81)
RDW: 15.1 % (ref 11.5–15.5)
WBC: 5.7 10*3/uL (ref 4.0–10.5)

## 2014-09-03 LAB — BASIC METABOLIC PANEL
ANION GAP: 10 (ref 5–15)
BUN: 20 mg/dL (ref 6–23)
CALCIUM: 9 mg/dL (ref 8.4–10.5)
CO2: 27 mmol/L (ref 19–32)
CREATININE: 1.25 mg/dL (ref 0.50–1.35)
Chloride: 103 mmol/L (ref 96–112)
GFR calc Af Amer: 61 mL/min — ABNORMAL LOW (ref >90)
GFR, EST NON AFRICAN AMERICAN: 53 mL/min — AB (ref >90)
Glucose, Bld: 150 mg/dL — ABNORMAL HIGH (ref 70–99)
Potassium: 3.7 mmol/L (ref 3.5–5.1)
Sodium: 140 mmol/L (ref 135–145)

## 2014-09-03 LAB — PROTIME-INR
INR: 1.91 — AB (ref 0.00–1.49)
Prothrombin Time: 22.1 seconds — ABNORMAL HIGH (ref 11.6–15.2)

## 2014-09-03 LAB — BRAIN NATRIURETIC PEPTIDE: B Natriuretic Peptide: 55.6 pg/mL (ref 0.0–100.0)

## 2014-09-03 LAB — TROPONIN I: Troponin I: <0.03 ng/mL (ref <0.031)

## 2014-09-03 NOTE — ED Provider Notes (Signed)
Plan of left-sided anterior chest pain onset today prostate 1 PM. Treated with 3 sublingual nitroglycerin last dose possibly 5 PM he became pain free at 9 PM. Presently asymptomatic.  Orlie Dakin, MD 09/04/14 [phone]

## 2014-09-03 NOTE — ED Provider Notes (Signed)
CSN: [id number]     Arrival date & time 09/03/14  1806 History   First MD Initiated Contact with Patient 09/03/14 1844     Chief Complaint  Patient presents with  . Chest Pain     (Consider location/radiation/quality/duration/timing/severity/associated sxs/prior Treatment) HPI Comments: Patient is an 81 year old male asked medical history significant for paroxysmal AF, HTN, COPD, and GIB here with CP. He developed 5/10 L sided chest pressure while sitting at home are around 1 PM this evening. He received 3 doses of sublingual nitroglycerin at home, last dose around approximately 5 PM. He has been chest pain-free since 9 PM this evening. He is denying any associated nausea, vomiting, shortness of breath, lower extremity edema. Patient was seen and evaluated in emergency department March 18 and admitted to cardiology for workup the patient requested discharge one day later and has not followed up with cardiology since then.  Patient is a 81 y.o. male presenting with chest pain.  Chest Pain   Past Medical History  Diagnosis Date  . History of upper gastrointestinal hemorrhage     dr. Maurene Capes, GI  . Cerebrovascular accident 46  . Gastric polyp   . Dysphagia, unspecified(787.20)   . Coronary artery disease     a. Nonobstructive by cath 2009.  [name] Permanent atrial fibrillation     INR followed at [facility].  [name] Hypertension   . Esophagitis   . GERD (gastroesophageal reflux disease)   . Erosive gastritis     H/o GIB felt secondary to this  . Gastric ulcer   . Hiatal hernia   . Internal hemorrhoids   . Hearing impairment   . Vision problems   . Chronic bronchitis     a. h/o resp failure in setting of bronchitis vs PNA 05/2012.  [name] Hyperlipidemia   . ASD (atrial septal defect)     a. s/p repair 1983.  [name] BPH (benign prostatic hyperplasia)   . COPD (chronic obstructive pulmonary disease)   . Anemia   . Depression   . LV dysfunction     a. EF 45-50% by echo 05/2012.  [name] Chronic diarrhea     Past Surgical History  Procedure Laterality Date  . Cholecystectomy  1990s  . Asd repair  [id number]  . Inguinal hernia repair  1963  . Partial small bowel resection  2005    ischemic?  [name] Enteroscopy  05/13/2012    Procedure: ENTEROSCOPY;  Surgeon: Inda Castle, MD;  Location: [facility] ENDOSCOPY;  Service: Endoscopy;  Laterality: N/A;  . Cardioversion      possibly 3 times, dr. brody/cooper   Family History  Problem Relation Age of Onset  . Heart disease Mother   . Heart disease Father   . Prostate cancer Brother    History  Substance Use Topics  . Smoking status: Former Smoker -- 1.00 packs/day for 20 years    Types: Cigarettes    Quit date: 06/13/1983  . Smokeless tobacco: Never Used  . Alcohol Use: No    Review of Systems  Cardiovascular: Positive for chest pain.  All other systems reviewed and are negative.     Allergies  Penicillins  Home Medications   Prior to Admission medications   Medication Sig Start Date End Date Taking? Authorizing Provider  Cyanocobalamin (VITAMIN B 12 PO) Take 500 mg by mouth daily. 1 tablet twice daily   Yes Historical Provider, MD  dutasteride (AVODART) 0.5 MG capsule TAKE (1) CAPSULE DAILY 08/29/14  Yes [name], MD  FLORASTOR 250 MG capsule TAKE  (1)  CAPSULE  TWICE DAILY.   Yes [name], MD  furosemide (LASIX) 20 MG tablet TAKE  (1)  TABLET BY MOUTH AS NEEDED FOR EDEMA Patient taking differently: Take 20 mg by mouth 2 (two) times daily.  08/20/14  Yes Rogelia Mire, NP  gabapentin (NEURONTIN) 600 MG tablet TAKE (1) TABLET BY MOUTH TWICE DAILY. 08/20/14  Yes Rogelia Mire, NP  ipratropium (ATROVENT) 0.02 % nebulizer solution NEBULIZE 1 VAIL 4 TIMES A DAY AS DIRECTED 05/02/14  Yes Tanda Rockers, MD  isosorbide mononitrate (IMDUR) 30 MG 24 hr tablet Take 1 tablet (30 mg total) by mouth daily. 08/20/14  Yes Rogelia Mire, NP  levalbuterol (XOPENEX) 0.63 MG/3ML nebulizer solution NEBULIZE 1 VIAL (WITH  IPRATROPIUM) TWICE A DAY AS NEEDED. MAY USE EXTR A EVERY 4 HOURS IF NEEDED 06/20/14  Yes [name], MD  mirtazapine (REMERON) 15 MG tablet Take 1 tablet (15 mg total) by mouth at bedtime. 08/20/14  Yes Rogelia Mire, NP  nitroGLYCERIN (NITROSTAT) 0.4 MG SL tablet Place 1 tablet (0.4 mg total) under the tongue every 5 (five) minutes x 3 doses as needed for chest pain. 08/20/14  Yes Rogelia Mire, NP  pantoprazole (PROTONIX) 40 MG tablet TAKE (1) TABLET TWICE A DAY BEFORE MEALS. Patient taking differently: TAKE (1) TABLET TWICE A DAY BEFORE MEALS AS NEEDED 04/25/14  Yes [name], MD  potassium chloride (K-DUR) 10 MEQ tablet TAKE (2) TABLETS TWICE DAILY. 08/25/14  Yes [name], MD  simvastatin (ZOCOR) 20 MG tablet TAKE ONE TABLET AT BEDTIME 02/10/14  Yes [name], MD  warfarin (COUMADIN) 1 MG tablet Take 2-3 tablets (2-3 mg total) by mouth daily at 6 PM. TAKES 2MG  ON MON, WED AND FRI  TAKES 3MG  ALL OTHER DAYS 08/20/14  Yes Rogelia Mire, NP  albuterol (PROVENTIL HFA;VENTOLIN HFA) 108 (90 BASE) MCG/ACT inhaler Inhale 2 puffs into the lungs every 6 (six) hours as needed for wheezing or shortness of breath. 04/27/14   [name], MD   BP 157/73 mmHg  Pulse 83  Resp 25  SpO2 95% Physical Exam  Constitutional: He is oriented to person, place, and time. He appears well-developed and well-nourished. No distress.  HENT:  Head: Normocephalic and atraumatic.  Right Ear: External ear normal.  Left Ear: External ear normal.  Nose: Nose normal.  Eyes: Conjunctivae are normal.  Neck: Neck supple.  Cardiovascular: Normal rate, normal heart sounds and intact distal pulses.  An irregularly irregular rhythm present.  Pulmonary/Chest: Effort normal and breath sounds normal. No respiratory distress.  Abdominal: Soft. There is no tenderness.  Musculoskeletal: Normal range of motion. He exhibits no edema.  Neurological: He is alert and oriented to person,  place, and time.  Skin: Skin is warm and dry. He is not diaphoretic.  Nursing note and vitals reviewed.   ED Course  Procedures (including critical care time) Labs Review Labs Reviewed  BASIC METABOLIC PANEL - Abnormal; Notable for the following:    Glucose, Bld 150 (*)    GFR calc non Af Amer 53 (*)    GFR calc Af Amer 61 (*)    All other components within normal limits  CBC WITH DIFFERENTIAL/PLATELET - Abnormal; Notable for the following:    Hemoglobin 12.3 (*)    All other components within normal limits  PROTIME-INR - Abnormal; Notable for the following:    Prothrombin Time 22.1 (*)  INR 1.91 (*)    All other components within normal limits  BRAIN NATRIURETIC PEPTIDE  TROPONIN I  CBC  CREATININE, SERUM  BASIC METABOLIC PANEL  LIPID PANEL  TROPONIN I  TROPONIN I    Imaging Review Dg Chest 2 View  09/03/2014   CLINICAL DATA:  Left-sided chest pain for 3 or 4 days.  EXAM: CHEST  2 VIEW  COMPARISON:  08/19/2014  FINDINGS: There is a large hiatal hernia. There are prominent central pulmonary arteries, unchanged and raising the question of pulmonary arterial hypertension. No airspace consolidation is evident. No effusion is evident.  IMPRESSION: No acute cardiopulmonary findings. There are prominent central pulmonary arteries raising the question of pulmonary arterial hypertension. There is a large hiatal hernia.   Electronically Signed   By: Andreas Newport M.D.   On: 09/03/2014 23:16     EKG Interpretation   Date/Time:  Saturday September 03 2014 18:12:26 EDT Ventricular Rate:  86 PR Interval:    QRS Duration: 126 QT Interval:  400 QTC Calculation: 478 R Axis:   111 Text Interpretation:  Atrial fibrillation with premature ventricular or  aberrantly conducted complexes Right bundle branch block T wave  abnormality, consider inferior ischemia Abnormal ECG No significant change  since last tracing Confirmed by JACUBOWITZ  MD, SAM [id number]) on 09/03/2014  9:06:09 PM       MDM   Final diagnoses:  Chest pain, unspecified chest pain type    Filed Vitals:   09/04/14 0144  BP: 157/73  Pulse: 83  Resp: 25   Afebrile, NAD, non-toxic appearing, AAOx4.  I have reviewed nursing notes, vital signs, and all appropriate lab and imaging results for this patient.  Concern for cardiac etiology of Chest Pain. Cardiology has been consulted and will see patient in the ED for likely admit. Pt does not meet criteria for CP protocol and a further evaluation is recommended. Pt has been re-evaluated prior to consult and VSS, NAD, heart RRR, pain 0/10, lungs CTAB. No acute abnormalities found on EKG and first round of cardiac enzymes negative. This case was discussed with Dr. Winfred Leeds who has seen the patient and agrees with plan to admit.      Baron Sane, PA-C 09/04/14 [id number]  Orlie Dakin, MD 09/05/14 [phone]

## 2014-09-03 NOTE — ED Notes (Signed)
Pt transported to xray 

## 2014-09-03 NOTE — ED Notes (Signed)
The pt is c/o chest pain since yesterday.  He was seen here last Friday for the same and spent the night.  exertiionally sob for awhile.  He had 3 sl nitro today that has not helped his pain.  Hx of the same

## 2014-09-04 ENCOUNTER — Encounter (HOSPITAL_COMMUNITY): Payer: Self-pay | Admitting: *Deleted

## 2014-09-04 DIAGNOSIS — I482 Chronic atrial fibrillation, unspecified: Secondary | ICD-10-CM | POA: Diagnosis present

## 2014-09-04 DIAGNOSIS — R079 Chest pain, unspecified: Secondary | ICD-10-CM | POA: Diagnosis not present

## 2014-09-04 DIAGNOSIS — R072 Precordial pain: Secondary | ICD-10-CM

## 2014-09-04 DIAGNOSIS — Z7901 Long term (current) use of anticoagulants: Secondary | ICD-10-CM

## 2014-09-04 LAB — CREATININE, SERUM
CREATININE: 1.11 mg/dL (ref 0.50–1.35)
GFR, EST AFRICAN AMERICAN: 70 mL/min — AB (ref >90)
GFR, EST NON AFRICAN AMERICAN: 61 mL/min — AB (ref >90)

## 2014-09-04 LAB — BASIC METABOLIC PANEL
Anion gap: 9 (ref 5–15)
BUN: 17 mg/dL (ref 6–23)
CALCIUM: 8.9 mg/dL (ref 8.4–10.5)
CHLORIDE: 106 mmol/L (ref 96–112)
CO2: 28 mmol/L (ref 19–32)
CREATININE: 1.06 mg/dL (ref 0.50–1.35)
GFR, EST AFRICAN AMERICAN: 74 mL/min — AB (ref >90)
GFR, EST NON AFRICAN AMERICAN: 64 mL/min — AB (ref >90)
GLUCOSE: 109 mg/dL — AB (ref 70–99)
Potassium: 3.8 mmol/L (ref 3.5–5.1)
Sodium: 143 mmol/L (ref 135–145)

## 2014-09-04 LAB — LIPID PANEL
Cholesterol: 121 mg/dL (ref 0–200)
HDL: 42 mg/dL (ref >39)
LDL Cholesterol: 52 mg/dL (ref 0–99)
TRIGLYCERIDES: 134 mg/dL (ref <150)
Total CHOL/HDL Ratio: 2.9 RATIO
VLDL: 27 mg/dL (ref 0–40)

## 2014-09-04 LAB — CBC
HCT: 38.7 % — ABNORMAL LOW (ref 39.0–52.0)
Hemoglobin: 12.1 g/dL — ABNORMAL LOW (ref 13.0–17.0)
MCH: 27.4 pg (ref 26.0–34.0)
MCHC: 31.3 g/dL (ref 30.0–36.0)
MCV: 87.8 fL (ref 78.0–100.0)
PLATELETS: 161 10*3/uL (ref 150–400)
RBC: 4.41 MIL/uL (ref 4.22–5.81)
RDW: 14.9 % (ref 11.5–15.5)
WBC: 5.5 10*3/uL (ref 4.0–10.5)

## 2014-09-04 LAB — TROPONIN I
Troponin I: 0.03 ng/mL (ref <0.031)
Troponin I: 0.03 ng/mL (ref <0.031)

## 2014-09-04 MED ORDER — WARFARIN SODIUM 2 MG PO TABS
2.0000 mg | ORAL_TABLET | ORAL | Status: DC
  Filled 2014-09-04: qty 1

## 2014-09-04 MED ORDER — WARFARIN SODIUM 2 MG PO TABS: 2.0000 mg | ORAL_TABLET | ORAL | Status: DC

## 2014-09-04 MED ORDER — PANTOPRAZOLE SODIUM 40 MG PO TBEC
40.0000 mg | DELAYED_RELEASE_TABLET | Freq: Two times a day (BID) | ORAL | Status: DC
  Administered 2014-09-04 – 2014-09-07 (×7): 40 mg via ORAL
  Filled 2014-09-04 (×7): qty 1

## 2014-09-04 MED ORDER — HEPARIN SODIUM (PORCINE) 5000 UNIT/ML IJ SOLN
5000.0000 [IU] | Freq: Three times a day (TID) | INTRAMUSCULAR | Status: DC
  Filled 2014-09-04 (×3): qty 1

## 2014-09-04 MED ORDER — ACETAMINOPHEN 325 MG PO TABS: 650.0000 mg | ORAL_TABLET | ORAL | Status: DC | PRN

## 2014-09-04 MED ORDER — GABAPENTIN 600 MG PO TABS
600.0000 mg | ORAL_TABLET | Freq: Two times a day (BID) | ORAL | Status: DC
  Administered 2014-09-04 – 2014-09-07 (×7): 600 mg via ORAL
  Filled 2014-09-04 (×8): qty 1

## 2014-09-04 MED ORDER — MIRTAZAPINE 15 MG PO TABS
15.0000 mg | ORAL_TABLET | Freq: Every day | ORAL | Status: DC
  Administered 2014-09-04 – 2014-09-06 (×3): 15 mg via ORAL
  Filled 2014-09-04 (×5): qty 1

## 2014-09-04 MED ORDER — ASPIRIN EC 81 MG PO TBEC
81.0000 mg | DELAYED_RELEASE_TABLET | Freq: Every day | ORAL | Status: DC
  Administered 2014-09-05 – 2014-09-06 (×2): 81 mg via ORAL
  Filled 2014-09-04 (×2): qty 1

## 2014-09-04 MED ORDER — ALBUTEROL SULFATE (2.5 MG/3ML) 0.083% IN NEBU: 2.5000 mg | INHALATION_SOLUTION | Freq: Four times a day (QID) | RESPIRATORY_TRACT | Status: DC | PRN

## 2014-09-04 MED ORDER — NITROGLYCERIN 0.4 MG SL SUBL: 0.4000 mg | SUBLINGUAL_TABLET | SUBLINGUAL | Status: DC | PRN

## 2014-09-04 MED ORDER — WARFARIN SODIUM 2 MG PO TABS: 2.0000 mg | ORAL_TABLET | Freq: Every day | ORAL | Status: DC

## 2014-09-04 MED ORDER — SIMVASTATIN 20 MG PO TABS
20.0000 mg | ORAL_TABLET | Freq: Every day | ORAL | Status: DC
  Administered 2014-09-04 – 2014-09-06 (×3): 20 mg via ORAL
  Filled 2014-09-04 (×4): qty 1

## 2014-09-04 MED ORDER — WARFARIN - PHYSICIAN DOSING INPATIENT
Freq: Every day | Status: DC
  Administered 2014-09-04 – 2014-09-05 (×2)

## 2014-09-04 MED ORDER — ONDANSETRON HCL 4 MG/2ML IJ SOLN: 4.0000 mg | Freq: Four times a day (QID) | INTRAMUSCULAR | Status: DC | PRN

## 2014-09-04 MED ORDER — IPRATROPIUM BROMIDE 0.02 % IN SOLN: 0.5000 mg | Freq: Four times a day (QID) | RESPIRATORY_TRACT | Status: DC | PRN

## 2014-09-04 MED ORDER — IPRATROPIUM BROMIDE 0.02 % IN SOLN: 0.2500 mg | Freq: Four times a day (QID) | RESPIRATORY_TRACT | Status: DC | PRN

## 2014-09-04 MED ORDER — ISOSORBIDE MONONITRATE ER 30 MG PO TB24
30.0000 mg | ORAL_TABLET | Freq: Every day | ORAL | Status: DC
  Administered 2014-09-04 – 2014-09-07 (×4): 30 mg via ORAL
  Filled 2014-09-04 (×4): qty 1

## 2014-09-04 MED ORDER — DUTASTERIDE 0.5 MG PO CAPS
0.5000 mg | ORAL_CAPSULE | Freq: Every day | ORAL | Status: DC
  Administered 2014-09-04 – 2014-09-07 (×4): 0.5 mg via ORAL
  Filled 2014-09-04 (×4): qty 1

## 2014-09-04 MED ORDER — WARFARIN SODIUM 3 MG PO TABS
3.0000 mg | ORAL_TABLET | ORAL | Status: DC
  Administered 2014-09-04: 3 mg via ORAL
  Filled 2014-09-04: qty 1

## 2014-09-04 NOTE — Progress Notes (Signed)
SUBJECTIVE:  Troponins are normal. Patient is stable. Many family members are in the room.   Filed Vitals:   09/04/14 0144 09/04/14 0200 09/04/14 0215 09/04/14 0315  BP: 157/73 159/72 162/86 163/87  Pulse: 83 74 79 75  Temp:    97.3 F (36.3 C)  TempSrc:    Oral  Resp: 25 20 19 16   Height:    5\' 11"  (1.803 m)  Weight:    163 lb 11.2 oz (74.254 kg)  SpO2: 95% 96% 95% 96%    No intake or output data in the 24 hours ending 09/04/14 1144  LABS: Basic Metabolic Panel:  Recent Labs  09/03/14 1821 09/04/14 0614  NA 140 143  K 3.7 3.8  CL 103 106  CO2 27 28  GLUCOSE 150* 109*  BUN 20 17  CREATININE 1.25 1.11  1.06  CALCIUM 9.0 8.9   Liver Function Tests: No results for input(s): AST, ALT, ALKPHOS, BILITOT, PROT, ALBUMIN in the last 72 hours. No results for input(s): LIPASE, AMYLASE in the last 72 hours. CBC:  Recent Labs  09/03/14 1821 09/04/14 0614  WBC 5.7 5.5  NEUTROABS 3.7  --   HGB 12.3* 12.1*  HCT 40.2 38.7*  MCV 89.1 87.8  PLT 169 161   Cardiac Enzymes:  Recent Labs  09/03/14 1821 09/04/14 0614  TROPONINI <0.03 0.03   BNP: Invalid input(s): POCBNP D-Dimer: No results for input(s): DDIMER in the last 72 hours. Hemoglobin A1C: No results for input(s): HGBA1C in the last 72 hours. Fasting Lipid Panel:  Recent Labs  09/04/14 0614  CHOL 121  HDL 42  LDLCALC 52  TRIG 134  CHOLHDL 2.9   Thyroid Function Tests: No results for input(s): TSH, T4TOTAL, T3FREE, THYROIDAB in the last 72 hours.  Invalid input(s): FREET3  RADIOLOGY: Dg Chest 2 View  09/03/2014   CLINICAL DATA:  Left-sided chest pain for 3 or 4 days.  EXAM: CHEST  2 VIEW  COMPARISON:  08/19/2014  FINDINGS: There is a large hiatal hernia. There are prominent central pulmonary arteries, unchanged and raising the question of pulmonary arterial hypertension. No airspace consolidation is evident. No effusion is evident.  IMPRESSION: No acute cardiopulmonary findings. There are  prominent central pulmonary arteries raising the question of pulmonary arterial hypertension. There is a large hiatal hernia.   Electronically Signed   By: Andreas Newport M.D.   On: 09/03/2014 23:16   Dg Chest Port 1 View  08/19/2014   CLINICAL DATA:  Shortness of breath, left chest pain  EXAM: PORTABLE CHEST - 1 VIEW  COMPARISON:  11/16/2012  FINDINGS: Chronic interstitial markings/ emphysematous changes. No focal consolidation. No frank interstitial edema. Left lung base is obscured. No pneumothorax.  The heart is top-normal in size.  IMPRESSION: No evidence of acute cardiopulmonary disease.   Electronically Signed   By: Julian Hy M.D.   On: 08/19/2014 19:28    PHYSICAL EXAM  patient is oriented to person time and place. Affect is normal. Lung exam reveals scattered rhonchi. There is no respiratory distress. Cardiac exam reveals S1 and S2. There is no peripheral edema.   TELEMETRY: I have reviewed telemetry today September 04, 2014. There is atrial fibrillation. The rate is controlled in the range of 70.  ASSESSMENT AND PLAN:    Chest pain      The patient is willing to stay for a stress study. It will be pharmacologic. He understands will be tomorrow.    Chronic atrial fibrillation  atrial fib rate is controlled.         On warfarin therapy     Coumadin is being continued.   Dola Argyle 09/04/2014 11:44 AM

## 2014-09-04 NOTE — ED Notes (Signed)
Attempted report 

## 2014-09-04 NOTE — Progress Notes (Signed)
UR Completed.  [phone]  

## 2014-09-04 NOTE — H&P (Signed)
HPI: [name] is an 89M with paroxysmal AF, HTN, COPD, and GIB here with CP.  He was recently admitted with the same on 3/19, where he had cardiac enzymes that were negative x3 and no ischemic ECG changes.  He was scheduled for stress testing but insisted on being discharged prior to this occurring. He was to follow up as an outpatient.  Since discharge [name] continues to have daily CP.  Today the episode lasted for hours so he presented to the ED.  In the ED he was hemodynamically stable.  First troponin was less than assay and there were no ischemic ECG changes.  He received ASA 324mg  and 3 sublingual nitroglycerin which alleviated his pain.  He remains CP free.  [name] denies SOB, n/v, diaphoresis, palpitations, lightheadedness or dizziness.  He also denies fever, chills or cough.    Review of Systems:  As per HPI    Past Medical History  Diagnosis Date  . History of upper gastrointestinal hemorrhage     dr. Maurene Capes, GI  . Cerebrovascular accident 45  . Gastric polyp   . Dysphagia, unspecified(787.20)   . Coronary artery disease     a. Nonobstructive by cath 2009.  [name] Permanent atrial fibrillation     INR followed at [facility].  [name] Hypertension   . Esophagitis   . GERD (gastroesophageal reflux disease)   . Erosive gastritis     H/o GIB felt secondary to this  . Gastric ulcer   . Hiatal hernia   . Internal hemorrhoids   . Hearing impairment   . Vision problems   . Chronic bronchitis     a. h/o resp failure in setting of bronchitis vs PNA 05/2012.  [name] Hyperlipidemia   . ASD (atrial septal defect)     a. s/p repair 1983.  [name] BPH (benign prostatic hyperplasia)   . COPD (chronic obstructive pulmonary disease)   . Anemia   . Depression   . LV dysfunction     a. EF 45-50% by echo 05/2012.  [name] Chronic diarrhea      (Not in a hospital admission)   Allergies  Allergen Reactions  . Penicillins Hives    History   Social History  . Marital Status: Married   Spouse Name: N/A  . Number of Children: N/A  . Years of Education: N/A   Occupational History  . Not on file.   Social History Main Topics  . Smoking status: Former Smoker -- 1.00 packs/day for 20 years    Types: Cigarettes    Quit date: 06/13/1983  . Smokeless tobacco: Never Used  . Alcohol Use: No  . Drug Use: No  . Sexual Activity: No   Other Topics Concern  . Not on file   Social History Narrative   Lives in [place] with wife.  Several falls recently, 4 in the last month.        [place] [facility]  [phone]                Family History  Problem Relation Age of Onset  . Heart disease Mother   . Heart disease Father   . Prostate cancer Brother     PHYSICAL EXAM: Filed Vitals:   09/04/14 0144  BP: 157/73  Pulse: 83  Resp: 25   General:  Well appearing. No respiratory difficulty HEENT: normal Neck: supple. no JVD. Carotids 2+ bilat; no bruits. No lymphadenopathy or thryomegaly appreciated. Cor: PMI nondisplaced. Irregularly irregular. No murmurs.  +  S4 Lungs: poor air movement and expiratory wheezes throughout. Abdomen: soft, nontender, nondistended. No hepatosplenomegaly. No bruits or masses. Good bowel sounds. Extremities: no cyanosis, clubbing, rash, edema Neuro: alert & oriented x 3, cranial nerves grossly intact. moves all 4 extremities w/o difficulty. Affect pleasant.  ECG: Sinus 86bpm.  RBBB.  PVC.  Results for orders placed or performed during the hospital encounter of 09/03/14 (from the past 24 hour(s))  Basic metabolic panel     Status: Abnormal   Collection Time: 09/03/14  6:21 PM  Result Value Ref Range   Sodium 140 135 - 145 mmol/L   Potassium 3.7 3.5 - 5.1 mmol/L   Chloride 103 96 - 112 mmol/L   CO2 27 19 - 32 mmol/L   Glucose, Bld 150 (H) 70 - 99 mg/dL   BUN 20 6 - 23 mg/dL   Creatinine, Ser 1.25 0.50 - 1.35 mg/dL   Calcium 9.0 8.4 - 10.5 mg/dL   GFR calc non Af Amer 53 (L) >90 mL/min   GFR calc Af Amer 61 (L) >90 mL/min   Anion gap 10  5 - 15  BNP (order ONLY if patient complains of dyspnea/SOB AND you have documented it for THIS visit)     Status: None   Collection Time: 09/03/14  6:21 PM  Result Value Ref Range   B Natriuretic Peptide 55.6 0.0 - 100.0 pg/mL  CBC with Differential     Status: Abnormal   Collection Time: 09/03/14  6:21 PM  Result Value Ref Range   WBC 5.7 4.0 - 10.5 K/uL   RBC 4.51 4.22 - 5.81 MIL/uL   Hemoglobin 12.3 (L) 13.0 - 17.0 g/dL   HCT 40.2 39.0 - 52.0 %   MCV 89.1 78.0 - 100.0 fL   MCH 27.3 26.0 - 34.0 pg   MCHC 30.6 30.0 - 36.0 g/dL   RDW 15.1 11.5 - 15.5 %   Platelets 169 150 - 400 K/uL   Neutrophils Relative % 64 43 - 77 %   Neutro Abs 3.7 1.7 - 7.7 K/uL   Lymphocytes Relative 22 12 - 46 %   Lymphs Abs 1.2 0.7 - 4.0 K/uL   Monocytes Relative 11 3 - 12 %   Monocytes Absolute 0.7 0.1 - 1.0 K/uL   Eosinophils Relative 2 0 - 5 %   Eosinophils Absolute 0.1 0.0 - 0.7 K/uL   Basophils Relative 1 0 - 1 %   Basophils Absolute 0.0 0.0 - 0.1 K/uL  Troponin I     Status: None   Collection Time: 09/03/14  6:21 PM  Result Value Ref Range   Troponin I <0.03 <0.031 ng/mL  Protime-INR     Status: Abnormal   Collection Time: 09/03/14  9:29 PM  Result Value Ref Range   Prothrombin Time 22.1 (H) 11.6 - 15.2 seconds   INR 1.91 (H) 0.00 - 1.49   Dg Chest 2 View  09/03/2014   CLINICAL DATA:  Left-sided chest pain for 3 or 4 days.  EXAM: CHEST  2 VIEW  COMPARISON:  08/19/2014  FINDINGS: There is a large hiatal hernia. There are prominent central pulmonary arteries, unchanged and raising the question of pulmonary arterial hypertension. No airspace consolidation is evident. No effusion is evident.  IMPRESSION: No acute cardiopulmonary findings. There are prominent central pulmonary arteries raising the question of pulmonary arterial hypertension. There is a large hiatal hernia.   Electronically Signed   By: Andreas Newport M.D.   On: 09/03/2014 23:16  ASSESSMENT: 89M with paroxysmal AF, HTN, COPD,  and GIB here with CP.  Currently CP free.  PLAN/DISCUSSION: # CP: Currently CP free.  Patient declined stress his last admission but now is amenable.  He does not think he can exercise.  - Repeat troponin x2 - Continue ASA 81mg  daily - Continue home statin. If pt has coronary disease will switch to a more cardioprotective statin - No beta blocker given tachy/brady syndrome  - Likely Lexiscan Myoview in AM. Pt does not think he can exercise for stress test.  # Atrial fibrillation: Patient in afib, though rates are controlled off nodal agents. Given bradycardia, will not start any nodal agents. - Continue warfarin. INR 2.4  # COPD: Stable, though mild wheezing on exam. - Continue home nebs and inhalers  # Code: Full

## 2014-09-05 ENCOUNTER — Observation Stay (HOSPITAL_COMMUNITY): Payer: Medicare Other

## 2014-09-05 ENCOUNTER — Other Ambulatory Visit (HOSPITAL_COMMUNITY): Payer: Medicare Other

## 2014-09-05 DIAGNOSIS — R079 Chest pain, unspecified: Secondary | ICD-10-CM | POA: Diagnosis not present

## 2014-09-05 DIAGNOSIS — R0789 Other chest pain: Secondary | ICD-10-CM | POA: Diagnosis not present

## 2014-09-05 DIAGNOSIS — R9439 Abnormal result of other cardiovascular function study: Secondary | ICD-10-CM | POA: Diagnosis not present

## 2014-09-05 DIAGNOSIS — I482 Chronic atrial fibrillation: Secondary | ICD-10-CM | POA: Diagnosis not present

## 2014-09-05 LAB — PROTIME-INR
INR: 1.97 — ABNORMAL HIGH (ref 0.00–1.49)
Prothrombin Time: 22.6 seconds — ABNORMAL HIGH (ref 11.6–15.2)

## 2014-09-05 MED ORDER — TECHNETIUM TC 99M SESTAMIBI GENERIC - CARDIOLITE
30.0000 | Freq: Once | INTRAVENOUS | Status: AC | PRN
  Administered 2014-09-05: 30 via INTRAVENOUS

## 2014-09-05 MED ORDER — REGADENOSON 0.4 MG/5ML IV SOLN
INTRAVENOUS | Status: AC
  Filled 2014-09-05: qty 5

## 2014-09-05 MED ORDER — ASPIRIN 81 MG PO CHEW
81.0000 mg | CHEWABLE_TABLET | ORAL | Status: AC
  Administered 2014-09-06: 81 mg via ORAL
  Filled 2014-09-05: qty 1

## 2014-09-05 MED ORDER — TECHNETIUM TC 99M SESTAMIBI GENERIC - CARDIOLITE
10.0000 | Freq: Once | INTRAVENOUS | Status: AC | PRN
  Administered 2014-09-05: 10 via INTRAVENOUS

## 2014-09-05 MED ORDER — AMINOPHYLLINE 25 MG/ML IV SOLN
80.0000 mg | INTRAVENOUS | Status: AC
  Administered 2014-09-05: 80 mg via INTRAVENOUS

## 2014-09-05 MED ORDER — REGADENOSON 0.4 MG/5ML IV SOLN
0.4000 mg | Freq: Once | INTRAVENOUS | Status: AC
  Administered 2014-09-05: 0.4 mg via INTRAVENOUS
  Filled 2014-09-05: qty 5

## 2014-09-05 MED ORDER — ALBUTEROL SULFATE HFA 108 (90 BASE) MCG/ACT IN AERS
INHALATION_SPRAY | RESPIRATORY_TRACT | Status: AC
  Filled 2014-09-05: qty 6.7

## 2014-09-05 MED ORDER — SODIUM CHLORIDE 0.9 % IV SOLN
1.0000 mL/kg/h | INTRAVENOUS | Status: DC
  Administered 2014-09-06: 1 mL/kg/h via INTRAVENOUS

## 2014-09-05 NOTE — Progress Notes (Addendum)
Rhonda Barrett PA-C discussed nuc results with patient and he was agreeable to cath. INR 1.96 today. Will hold further Coumadin. Dr. Debara Pickett does not feel bridging needed given that cath will likely occur tomorrow. Pre-cath orders placed. Have written name on add-on board.  Dayna Dunn PA-C

## 2014-09-05 NOTE — Progress Notes (Signed)
UR completed 

## 2014-09-05 NOTE — Progress Notes (Signed)
Patient: [name] / Admit Date: 09/03/2014 / Date of Encounter: 09/05/2014, 9:40 AM   Subjective: Denies any active complaints.   Objective: Telemetry: atrial fibrillation, rate controlled. Occasional PVCs/couplets. Nocturnal HR upper 40s at times, two pauses <2.2 sec Physical Exam: Blood pressure 111/60, pulse 61, temperature 98.3 F (36.8 C), temperature source Oral, resp. rate 18, height 5\' 11"  (1.803 m), weight 163 lb 11.2 oz (74.254 kg), SpO2 94 %. General: Well developed, well nourished WM, in no acute distress. Lying flat Head: Normocephalic, atraumatic, sclera non-icteric, no xanthomas, nares are without discharge. Neck: JVP not elevated. Lungs: Mild quiet expiratory wheezing. No les, or rhonchi. Breathing is unlabored. Heart: RRR S1 S2 without murmurs, rubs, or gallops.  Abdomen: Soft, non-tender, non-distended with normoactive bowel sounds. No rebound/guarding. Extremities: No clubbing or cyanosis. No edema. Distal pedal pulses are 2+ and equal bilaterally. Neuro: Alert and oriented X 3. Moves all extremities spontaneously. Psych:  Responds to questions appropriately with a normal affect.   Intake/Output Summary (Last 24 hours) at 09/05/14 0940 Last data filed at 09/05/14 0919  Gross per 24 hour  Intake    480 ml  Output    600 ml  Net   -120 ml    Inpatient Medications:  . aspirin EC  81 mg Oral Daily  . dutasteride  0.5 mg Oral Daily  . gabapentin  600 mg Oral BID  . isosorbide mononitrate  30 mg Oral Daily  . mirtazapine  15 mg Oral QHS  . pantoprazole  40 mg Oral BID  . regadenoson      . regadenoson  0.4 mg Intravenous Once  . simvastatin  20 mg Oral QHS  . warfarin  3 mg Oral Q T,Th,S,Su-1800   And  . warfarin  2 mg Oral Q M,W,F  . Warfarin - Physician Dosing Inpatient   Does not apply q1800   Infusions:    Labs:  Recent Labs  09/03/14 1821 09/04/14 0614  NA 140 143  K 3.7 3.8  CL 103 106  CO2 27 28  GLUCOSE 150* 109*  BUN 20 17    CREATININE 1.25 1.11  1.06  CALCIUM 9.0 8.9   No results for input(s): AST, ALT, ALKPHOS, BILITOT, PROT, ALBUMIN in the last 72 hours.  Recent Labs  09/03/14 1821 09/04/14 0614  WBC 5.7 5.5  NEUTROABS 3.7  --   HGB 12.3* 12.1*  HCT 40.2 38.7*  MCV 89.1 87.8  PLT 169 161    Recent Labs  09/03/14 1821 09/04/14 0614 09/04/14 1326  TROPONINI <0.03 0.03 0.03   Invalid input(s): POCBNP No results for input(s): HGBA1C in the last 72 hours.   Radiology/Studies:  Dg Chest 2 View  09/03/2014   CLINICAL DATA:  Left-sided chest pain for 3 or 4 days.  EXAM: CHEST  2 VIEW  COMPARISON:  08/19/2014  FINDINGS: There is a large hiatal hernia. There are prominent central pulmonary arteries, unchanged and raising the question of pulmonary arterial hypertension. No airspace consolidation is evident. No effusion is evident.  IMPRESSION: No acute cardiopulmonary findings. There are prominent central pulmonary arteries raising the question of pulmonary arterial hypertension. There is a large hiatal hernia.   Electronically Signed   By: Andreas Newport M.D.   On: 09/03/2014 23:16   Dg Chest Port 1 View  08/19/2014   CLINICAL DATA:  Shortness of breath, left chest pain  EXAM: PORTABLE CHEST - 1 VIEW  COMPARISON:  11/16/2012  FINDINGS: Chronic interstitial markings/ emphysematous changes.  No focal consolidation. No frank interstitial edema. Left lung base is obscured. No pneumothorax.  The heart is top-normal in size.  IMPRESSION: No evidence of acute cardiopulmonary disease.   Electronically Signed   By: Julian Hy M.D.   On: 08/19/2014 19:28     Assessment and Plan  1. Chest pain 2. Chronic atrial fibrillation, on chronic Coumadin 3. COPD  Ruled out for MI. Discussed nuc choice with MD. Patient tolerated Lexiscan overall but did have increased dyspnea and wheezing. Aminophylline given at 6 minutes for reversal with improvement in dyspnea. Await nuc results. One day study thus results should  be available later today.  Signed, Melina Copa PA-C

## 2014-09-06 ENCOUNTER — Encounter (HOSPITAL_COMMUNITY): Payer: Self-pay | Admitting: Interventional Cardiology

## 2014-09-06 ENCOUNTER — Encounter (HOSPITAL_COMMUNITY)
Admission: EM | Disposition: A | Discharge status: Home / Self Care | Payer: Self-pay | Source: Home / Self Care | Attending: Cardiology

## 2014-09-06 DIAGNOSIS — R9439 Abnormal result of other cardiovascular function study: Secondary | ICD-10-CM | POA: Insufficient documentation

## 2014-09-06 HISTORY — PX: LEFT HEART CATHETERIZATION WITH CORONARY ANGIOGRAM: SHX5451

## 2014-09-06 LAB — BASIC METABOLIC PANEL
Anion gap: 3 — ABNORMAL LOW (ref 5–15)
BUN: 18 mg/dL (ref 6–23)
CALCIUM: 8.7 mg/dL (ref 8.4–10.5)
CO2: 26 mmol/L (ref 19–32)
Chloride: 109 mmol/L (ref 96–112)
Creatinine, Ser: 1.17 mg/dL (ref 0.50–1.35)
GFR, EST AFRICAN AMERICAN: 66 mL/min — AB (ref >90)
GFR, EST NON AFRICAN AMERICAN: 57 mL/min — AB (ref >90)
Glucose, Bld: 96 mg/dL (ref 70–99)
POTASSIUM: 3.6 mmol/L (ref 3.5–5.1)
Sodium: 138 mmol/L (ref 135–145)

## 2014-09-06 LAB — CBC
HCT: 36.2 % — ABNORMAL LOW (ref 39.0–52.0)
HEMOGLOBIN: 11.1 g/dL — AB (ref 13.0–17.0)
MCH: 27.4 pg (ref 26.0–34.0)
MCHC: 30.7 g/dL (ref 30.0–36.0)
MCV: 89.4 fL (ref 78.0–100.0)
Platelets: 147 10*3/uL — ABNORMAL LOW (ref 150–400)
RBC: 4.05 MIL/uL — AB (ref 4.22–5.81)
RDW: 15.1 % (ref 11.5–15.5)
WBC: 4.3 10*3/uL (ref 4.0–10.5)

## 2014-09-06 LAB — PROTIME-INR
INR: 1.88 — AB (ref 0.00–1.49)
Prothrombin Time: 21.8 seconds — ABNORMAL HIGH (ref 11.6–15.2)

## 2014-09-06 SURGERY — LEFT HEART CATHETERIZATION WITH CORONARY ANGIOGRAM
Anesthesia: LOCAL

## 2014-09-06 MED ORDER — VERAPAMIL HCL 2.5 MG/ML IV SOLN
INTRAVENOUS | Status: AC
Start: 2014-09-06 — End: 2014-09-06
  Filled 2014-09-06: qty 2

## 2014-09-06 MED ORDER — WARFARIN - PHARMACIST DOSING INPATIENT
Freq: Every day | Status: DC
  Administered 2014-09-06: 19:00:00

## 2014-09-06 MED ORDER — ONDANSETRON HCL 4 MG/2ML IJ SOLN: 4.0000 mg | Freq: Four times a day (QID) | INTRAMUSCULAR | Status: DC | PRN

## 2014-09-06 MED ORDER — HEPARIN SODIUM (PORCINE) 1000 UNIT/ML IJ SOLN
INTRAMUSCULAR | Status: AC
  Filled 2014-09-06: qty 1

## 2014-09-06 MED ORDER — HEPARIN (PORCINE) IN NACL 2-0.9 UNIT/ML-% IJ SOLN
INTRAMUSCULAR | Status: AC
  Filled 2014-09-06: qty 500

## 2014-09-06 MED ORDER — WARFARIN SODIUM 3 MG PO TABS
3.5000 mg | ORAL_TABLET | Freq: Once | ORAL | Status: AC
  Administered 2014-09-06: 3.5 mg via ORAL
  Filled 2014-09-06: qty 1

## 2014-09-06 MED ORDER — LIDOCAINE HCL (PF) 1 % IJ SOLN
INTRAMUSCULAR | Status: AC
  Filled 2014-09-06: qty 30

## 2014-09-06 MED ORDER — SODIUM CHLORIDE 0.9 % IV SOLN
1.0000 mL/kg/h | INTRAVENOUS | Status: AC
  Administered 2014-09-06: 1 mL/kg/h via INTRAVENOUS

## 2014-09-06 MED ORDER — HEPARIN (PORCINE) IN NACL 2-0.9 UNIT/ML-% IJ SOLN
INTRAMUSCULAR | Status: AC
  Filled 2014-09-06: qty 1000

## 2014-09-06 MED ORDER — ACETAMINOPHEN 325 MG PO TABS: 650.0000 mg | ORAL_TABLET | ORAL | Status: DC | PRN

## 2014-09-06 NOTE — Progress Notes (Signed)
55ml air removed from TR band; pt found to be eating with R hand and pushing self up in bed with R hand; wife at bedside; pt instructed not to use R hand for anything; wife given explanation as well; pt insisted on using R hand while RN in room; pt sitting on side of bed eating; pt given pillow to elevate R arm and encouraged strongly to not use R hand today; wife stated she would assist pt in keeping R hand still; will cont. To monitor.

## 2014-09-06 NOTE — Progress Notes (Signed)
Subjective: No complaints today. Denies chest pain, SOB.  Objective: Vital signs in last 24 hours: Temp:  [97.8 F (36.6 C)-98.7 F (37.1 C)] 98.7 F (37.1 C) (04/05 0546) Pulse Rate:  [68] 68 (04/05 0546) Resp:  [17-18] 17 (04/05 0546) BP: (127-134)/(52-71) 127/52 mmHg (04/05 0546) SpO2:  [93 %-96 %] 93 % (04/05 0546) Weight:  [163 lb 2.3 oz (74 kg)] 163 lb 2.3 oz (74 kg) (04/04 1956) Weight change:  Last BM Date: 09/04/14 Intake/Output from previous day:  04/04 0701 - 04/05 0700 In: 240 [P.O.:240] Out: 400 [Urine:400] Intake/Output this shift: Total I/O In: -  Out: 400 [Urine:400]  PE:  General: pleasant affect, NAD Skin: warm and dry, brisk capillary refill HEENT: normocephalic, sclera clear, mucus membranes moist Heart: irregularly irregular, without murmur, gallup, rub or click Lungs: clear without rales, rhonchi, or wheezes Abd: soft, non tender, + BS, do not palpate liver spleen or masses Ext: no lower ext edema, 2+ radial pulses Neuro: alert and oriented, MAE, follows commands, + facial symmetry Tele: occasional PVCs, atrial fibrillation (rate controlled, approximately 70)  Lab Results:  Recent Labs  09/04/14 0614 09/06/14 0603  WBC 5.5 4.3  HGB 12.1* 11.1*  HCT 38.7* 36.2*  PLT 161 147*   BMET  Recent Labs  09/04/14 0614 09/06/14 0603  NA 143 138  K 3.8 3.6  CL 106 109  CO2 28 26  GLUCOSE 109* 96  BUN 17 18  CREATININE 1.11  1.06 1.17  CALCIUM 8.9 8.7    Recent Labs  09/04/14 0614 09/04/14 1326  TROPONINI 0.03 0.03    Lab Results  Component Value Date   CHOL 121 09/04/2014   HDL 42 09/04/2014   LDLCALC 52 09/04/2014   LDLDIRECT 48.8 07/30/2013   TRIG 134 09/04/2014   CHOLHDL 2.9 09/04/2014   Lab Results  Component Value Date   HGBA1C  06/26/2007    6.0 (NOTE)   The ADA recommends the following therapeutic goals for glycemic   control related to Hgb A1C measurement:   Goal of Therapy:   < 7.0% Hgb A1C   Action  Suggested:  > 8.0% Hgb A1C   Ref:  Diabetes Care, 22, Suppl. 1, 1999     Lab Results  Component Value Date   TSH 2.03 02/17/2013   Hepatic Function Panel No results for input(s): PROT, ALBUMIN, AST, ALT, ALKPHOS, BILITOT, BILIDIR, IBILI in the last 72 hours.  Recent Labs  09/04/14 0614  CHOL 121   No results for input(s): PROTIME in the last 72 hours.    Studies/Results: Nm Myocar Multi W/spect W/wall Motion / Ef  09/05/2014   CLINICAL DATA:  Chest pain  EXAM: MYOCARDIAL IMAGING WITH SPECT (REST AND PHARMACOLOGIC-STRESS)  GATED LEFT VENTRICULAR WALL MOTION STUDY  LEFT VENTRICULAR EJECTION FRACTION  TECHNIQUE: Standard myocardial SPECT imaging was performed after resting intravenous injection of 10 mCi Tc-66m sestamibi. Subsequently, intravenous infusion of Lexiscan was performed under the supervision of the Cardiology staff. At peak effect of the drug, 30 mCi Tc-32m sestamibi was injected intravenously and standard myocardial SPECT imaging was performed. Quantitative gated imaging was also performed to evaluate left ventricular wall motion, and estimate left ventricular ejection fraction.  COMPARISON:  None.  FINDINGS: Baseline EKG: Atrial fibrillation with controlled ventricular response and RBBB. During Lexiscan infusion there were no significant ST changes from baseline EKG. Aminophylline was given in recovery for symptoms of SOB. No chest pain was noted.  RAW images showed  mild diaphragmatic attenuation and increased uptake of radiotracer below the diaphragm. There is an area of increased uptake in the left forearm.  Perfusion: Large in size, moderate in intensity defects noted in the apical and mid inferolateral, anteroapical and inferoapical walls. This defect is primarily fixed with a possible small area of reversibility in the apex. There is a small in size, moderate in intensity fixed defect in the basal inferoseptum.  Wall Motion: Normal left ventricular wall motion. No left ventricular  dilation.  Left Ventricular Ejection Fraction: 11% %  End diastolic volume 73 ml  End systolic volume 27 ml  IMPRESSION: 1. Large in size, moderate in intensity defect noted in the mid and apical inferolateral, anteroapical and inferoapical walls. This defect is primarily fixed with a possible small area of reversibility in the apex. There is a small in size, moderate in intensity fixed defect in the basal inferoseptum. Cannot rule out a very small area of ischemia in the apex. There is an area of increased uptake of radiotracer in the left forearm.  2. Normal left ventricular wall motion.  3. Left ventricular ejection fraction 63%  4. Intermediaterisk stress test findings*.  *2012 Appropriate Use Criteria for Coronary Revascularization Focused Update: J Am Coll Cardiol. 5726;20(3):559-741. http://content.airportbarriers.com.aspx?articleid=1201161   Electronically Signed   By: Fransico Him   On: 09/05/2014 16:05    Medications: I have reviewed the patient's current medications. Scheduled Meds: . aspirin EC  81 mg Oral Daily  . dutasteride  0.5 mg Oral Daily  . gabapentin  600 mg Oral BID  . isosorbide mononitrate  30 mg Oral Daily  . mirtazapine  15 mg Oral QHS  . pantoprazole  40 mg Oral BID  . simvastatin  20 mg Oral QHS   Continuous Infusions: . sodium chloride 1 mL/kg/hr (09/06/14 0447)   PRN Meds:.acetaminophen, albuterol, ipratropium, nitroGLYCERIN, ondansetron (ZOFRAN) IV  Assessment/Plan:  Chest pain - currently denies chest pain - Abnormal nuc yesterday, cath scheduled as add-on for today  - Continue home aspirin, statin  Chronic atrial fibrillation - Rate controlled - Holding coumadin prior to cath, INR 1.88 today  COPD - stable - Continue home inhaler/nebs PRN  Cardiac cath  The patient understands that risks included but are not limited to stroke (1 in 1000), death (1 in 1000), kidney failure [usually temporary] (1 in 500), bleeding (1 in 200), allergic reaction  [possibly serious] (1 in 200).      LOS: 2 days   Time spent with pt. :15 minutes. [facility]  Nurse Practitioner Certified Pager [phone] or after 5pm and on weekends call [phone] 09/06/2014, 1:54 PM

## 2014-09-06 NOTE — Interval H&P Note (Signed)
Cath Lab Visit (complete for each Cath Lab visit)  Clinical Evaluation Leading to the Procedure:   ACS: Yes.    Non-ACS:    Anginal Classification: CCS IV  Anti-ischemic medical therapy: Minimal Therapy (1 class of medications)  Non-Invasive Test Results: Intermediate-risk stress test findings: cardiac mortality 1-3%/year  Prior CABG: No previous CABG  Ischemic Symptoms? CCS IV (Inability to carry on any physical activity without discomfort) Anti-ischemic Medical Therapy? Minimal Therapy (1 class of medications) Non-invasive Test Results? Intermediate-risk stress test findings: cardiac mortality 1-3%/year Prior CABG? No Previous CABG   Patient Information:   1-2V CAD, no prox LAD  U (6)  Indication: 16; Score: 6   Patient Information:   CTO of 1 vessel, no other CAD  U (6)  Indication: 26; Score: 6   Patient Information:   1V CAD with prox LAD  A (7)  Indication: 32; Score: 7   Patient Information:   2V-CAD with prox LAD  A (8)  Indication: 38; Score: 8   Patient Information:   3V-CAD without LMCA  A (8)  Indication: 44; Score: 8   Patient Information:   3V-CAD without LMCA With Abnormal LV systolic function  A (9)  Indication: 48; Score: 9   Patient Information:   LMCA-CAD  A (9)  Indication: 49; Score: 9   Patient Information:   2V-CAD with prox LAD PCI  A (7)  Indication: 62; Score: 7   Patient Information:   2V-CAD with prox LAD CABG  A (8)  Indication: 62; Score: 8   Patient Information:   3V-CAD without LMCA With Low CAD burden(i.e., 3 focal stenoses, low SYNTAX score) PCI  A (7)  Indication: 63; Score: 7   Patient Information:   3V-CAD without LMCA With Low CAD burden(i.e., 3 focal stenoses, low SYNTAX score) CABG  A (9)  Indication: 63; Score: 9   Patient Information:   3V-CAD without LMCA E06c - Intermediate-high CAD burden (i.e., multiple diffuse lesions, presence of CTO, or high SYNTAX  score) PCI  U (4)  Indication: 64; Score: 4   Patient Information:   3V-CAD without LMCA E06c - Intermediate-high CAD burden (i.e., multiple diffuse lesions, presence of CTO, or high SYNTAX score) CABG  A (9)  Indication: 64; Score: 9   Patient Information:   LMCA-CAD With Isolated LMCA stenosis  PCI  U (6)  Indication: 65; Score: 6   Patient Information:   LMCA-CAD With Isolated LMCA stenosis  CABG  A (9)  Indication: 65; Score: 9   Patient Information:   LMCA-CAD Additional CAD, low CAD burden (i.e., 1- to 2-vessel additional involvement, low SYNTAX score) PCI  U (5)  Indication: 66; Score: 5   Patient Information:   LMCA-CAD Additional CAD, low CAD burden (i.e., 1- to 2-vessel additional involvement, low SYNTAX score) CABG  A (9)  Indication: 66; Score: 9   Patient Information:   LMCA-CAD Additional CAD, intermediate-high CAD burden (i.e., 3-vessel involvement, presence of CTO, or high SYNTAX score) PCI  I (3)  Indication: 67; Score: 3   Patient Information:   LMCA-CAD Additional CAD, intermediate-high CAD burden (i.e., 3-vessel involvement, presence of CTO, or high SYNTAX score) CABG  A (9)  Indication: 67; Score: 9     History and Physical Interval Note:  09/06/2014 4:12 PM  [name]  has presented today for surgery, with the diagnosis of chest pain  The various methods of treatment have been discussed with the patient and family. After consideration of  risks, benefits and other options for treatment, the patient has consented to  Procedure(s): LEFT HEART CATHETERIZATION WITH CORONARY ANGIOGRAM (N/A) as a surgical intervention .  The patient's history has been reviewed, patient examined, no change in status, stable for surgery.  I have reviewed the patient's chart and labs.  Questions were answered to the patient's satisfaction.     [name] S.

## 2014-09-06 NOTE — Progress Notes (Signed)
ANTICOAGULATION CONSULT NOTE - Initial Consult  Pharmacy Consult for Warfarin Indication: atrial fibrillation  Allergies  Allergen Reactions  . Penicillins Hives    Patient Measurements: Height: 5\' 11"  (180.3 cm) Weight: 163 lb 2.3 oz (74 kg) IBW/kg (Calculated) : 75.3  Vital Signs: Temp: 98.1 F (36.7 C) (04/05 1411) Temp Source: Oral (04/05 1411) BP: 148/66 mmHg (04/05 1411) Pulse Rate: 79 (04/05 1556)  Labs:  Recent Labs  09/03/14 1821 09/03/14 2129 09/04/14 0614 09/04/14 1326 09/05/14 0406 09/06/14 0603  HGB 12.3*  --  12.1*  --   --  11.1*  HCT 40.2  --  38.7*  --   --  36.2*  PLT 169  --  161  --   --  147*  LABPROT  --  22.1*  --   --  22.6* 21.8*  INR  --  1.91*  --   --  1.97* 1.88*  CREATININE 1.25  --  1.11  1.06  --   --  1.17  TROPONINI <0.03  --  0.03 0.03  --   --     Estimated Creatinine Clearance: 52.7 mL/min (by C-G formula based on Cr of 1.17).   Medical History: Past Medical History  Diagnosis Date  . History of upper gastrointestinal hemorrhage     dr. Maurene Capes, GI  . Cerebrovascular accident 25  . Gastric polyp   . Dysphagia, unspecified(787.20)   . Coronary artery disease     a. Nonobstructive by cath 2009.  [name] Permanent atrial fibrillation     INR followed at [facility].  [name] Hypertension   . Esophagitis   . GERD (gastroesophageal reflux disease)   . Erosive gastritis     H/o GIB felt secondary to this  . Gastric ulcer   . Hiatal hernia   . Internal hemorrhoids   . Hearing impairment   . Vision problems   . Chronic bronchitis     a. h/o resp failure in setting of bronchitis vs PNA 05/2012.  [name] Hyperlipidemia   . ASD (atrial septal defect)     a. s/p repair 1983.  [name] BPH (benign prostatic hyperplasia)   . COPD (chronic obstructive pulmonary disease)   . Anemia   . Depression   . LV dysfunction     a. EF 45-50% by echo 05/2012.  [name] Chronic diarrhea     Assessment: 82 YOF on warfarin PTA for hx Afib held on 4/4 for planned cath  today (4/5). Post-cath, pharmacy has been consulted to resume warfarin dosing. INR is SUBtherapeutic (INR 1.88 << 1.97, goal of 2-3). Hgb/Hct/Plt slight drop. PTA dose was 3 mg daily EXCEPT for 2 mg on MWF however had recently been reduced as the patient had been having elevated INRs.  Goal of Therapy:  INR 2-3   Plan:  1. Warfarin 3.5 mg x 1 dose at 1800 today 2. Daily PT/INR 3. Will continue to monitor for any signs/symptoms of bleeding and will follow up with PT/INR in the a.m.   Alycia Rossetti, PharmD, BCPS Clinical Pharmacist Pager: [phone] 09/06/2014 5:13 PM

## 2014-09-06 NOTE — Progress Notes (Signed)
Patient returned to room from the cath lab. Patient A/O, VSS, A. Fib on the monitor, pain 0/10. TR band in place, POC discussed with the patient and his wife. afleming RN

## 2014-09-06 NOTE — Progress Notes (Signed)
UR completed 

## 2014-09-06 NOTE — CV Procedure (Signed)
       PROCEDURE:  Left heart catheterization with selective coronary angiography.  INDICATIONS:  Abnormal stress test  The risks, benefits, and details of the procedure were explained to the patient.  The patient verbalized understanding and wanted to proceed.  Informed written consent was obtained.  PROCEDURE TECHNIQUE:  After Xylocaine anesthesia a 80F slender sheath was placed in the right radial artery with a single anterior needle wall stick.   IV Heparin was given.  Right coronary angiography was done using a Judkins R4 guide catheter.  Left coronary angiography was done using a Judkins L3.5 guide catheter.  Left heart catheterization was done using a pigtail catheter.  A TR band was used for hemostasis.   CONTRAST:  Total of 30 cc.  COMPLICATIONS:  None.    HEMODYNAMICS:  Aortic pressure was 166/76; LV pressure was 163/1; LVEDP 12.  There was no gradient between the left ventricle and aorta.    ANGIOGRAPHIC DATA:   The left main coronary artery is a short vessel which is widely patent.  The left anterior descending artery is a large vessel which reaches the apex. There is mild atherosclerosis noted in the mid vessel. There is a large first diagonal which is widely patent..  The left circumflex artery is a medium size vessel. There is a large first obtuse marginal which is widely patent. The remainder of the circumflex is medium-sized with only mild atherosclerosis. The second and third obtuse marginals are small but patent.  The right coronary artery is a large, dominant vessel. There is mild diffuse atherosclerosis in the proximal to mid RCA. The posterior lateral artery is large and widely patent. The posterior descending artery is large and widely patent.  LEFT VENTRICULOGRAM:  Left ventricular angiogram was not done.  LVEDP was 12 mmHg.  IMPRESSIONS:  1. Mild diffuse coronary artery disease as noted above. 2. LVEDP 12 mmHg.    RECOMMENDATION:  No significant coronary  artery disease. Continue aggressive medical therapy and risk factor modification. Resume warfarin. Monitor right radial site for bleeding given the catheterization.    Cardiology follow-up per Dr. Rayann Heman.

## 2014-09-06 NOTE — H&P (View-Only) (Signed)
Rhonda Barrett PA-C discussed nuc results with patient and he was agreeable to cath. INR 1.96 today. Will hold further Coumadin. Dr. Debara Pickett does not feel bridging needed given that cath will likely occur tomorrow. Pre-cath orders placed. Have written name on add-on board.  Dayna Dunn PA-C

## 2014-09-07 DIAGNOSIS — R9439 Abnormal result of other cardiovascular function study: Secondary | ICD-10-CM | POA: Diagnosis not present

## 2014-09-07 DIAGNOSIS — I482 Chronic atrial fibrillation: Secondary | ICD-10-CM | POA: Diagnosis not present

## 2014-09-07 DIAGNOSIS — R0789 Other chest pain: Secondary | ICD-10-CM | POA: Diagnosis not present

## 2014-09-07 LAB — BASIC METABOLIC PANEL
Anion gap: 4 — ABNORMAL LOW (ref 5–15)
BUN: 16 mg/dL (ref 6–23)
CALCIUM: 8.3 mg/dL — AB (ref 8.4–10.5)
CO2: 27 mmol/L (ref 19–32)
CREATININE: 1.13 mg/dL (ref 0.50–1.35)
Chloride: 110 mmol/L (ref 96–112)
GFR calc Af Amer: 69 mL/min — ABNORMAL LOW (ref >90)
GFR, EST NON AFRICAN AMERICAN: 59 mL/min — AB (ref >90)
Glucose, Bld: 99 mg/dL (ref 70–99)
Potassium: 3.6 mmol/L (ref 3.5–5.1)
SODIUM: 141 mmol/L (ref 135–145)

## 2014-09-07 LAB — PROTIME-INR
INR: 1.56 — ABNORMAL HIGH (ref 0.00–1.49)
PROTHROMBIN TIME: 18.8 s — AB (ref 11.6–15.2)

## 2014-09-07 MED ORDER — WARFARIN SODIUM 3 MG PO TABS
3.5000 mg | ORAL_TABLET | Freq: Once | ORAL | Status: DC
  Filled 2014-09-07: qty 1

## 2014-09-07 NOTE — Discharge Summary (Signed)
Physician Discharge Summary  Patient ID: [name] MRN: [id number] DOB/AGE: [birth year] 79 y.o.   Primary Cardiologist: Dr. Rayann Heman  Admit date: 09/03/2014 Discharge date: 09/07/2014  Admission Diagnoses: Chest Pain  Discharge Diagnoses:  Active Problems:   Chest pain   Chronic atrial fibrillation   On warfarin therapy   Abnormal stress test   Discharged Condition: stable  Hospital Course: [name] is an 81 y/o male with chronicl AF on warfarin, HTN, COPD, and h/o GIB admitted for chest pain eval. By the time he arrived to ED, his pain had resolved. Cardiac enzymes were cycled and negative x 3. He underwent a NST which was interpreted as an intermediate risk study. Subsequent LHC showed mild diffuse, nonobstructive CAD. Continued medical therapy and risk factor reduction was recommend. He was continued on simvastatin and Imdur. Warfarin alone, w/o ASA was continued due to h/o GIB. He had no post cath complications and no further chest pain. His afib remained rated control. He had no other issues. He was last seen and examined by Dr. Debara Pickett, who determined he was stable for discharge home. Post-hospital f/u has been arranged with Dr. Ermalinda Barrios, PA-C, on 09/26/14. He will continue routine follow-up with Dr. Rayann Heman.   Consults: None  Significant Diagnostic Studies:   NST 09/05/14 IMPRESSION: 1. Large in size, moderate in intensity defect noted in the mid and apical inferolateral, anteroapical and inferoapical walls. This defect is primarily fixed with a possible small area of reversibility in the apex. There is a small in size, moderate in intensity fixed defect in the basal inferoseptum. Cannot rule out a very small area of ischemia in the apex. There is an area of increased uptake of radiotracer in the left forearm.  2. Normal left ventricular wall motion.  3. Left ventricular ejection fraction 63%  4. Intermediaterisk stress test findings*.  LHC 09/06/14 HEMODYNAMICS:  Aortic pressure was 166/76; LV pressure was 163/1; LVEDP 12. There was no gradient between the left ventricle and aorta.   ANGIOGRAPHIC DATA: The left main coronary artery is a short vessel which is widely patent.  The left anterior descending artery is a large vessel which reaches the apex. There is mild atherosclerosis noted in the mid vessel. There is a large first diagonal which is widely patent..  The left circumflex artery is a medium size vessel. There is a large first obtuse marginal which is widely patent. The remainder of the circumflex is medium-sized with only mild atherosclerosis. The second and third obtuse marginals are small but patent.  The right coronary artery is a large, dominant vessel. There is mild diffuse atherosclerosis in the proximal to mid RCA. The posterior lateral artery is large and widely patent. The posterior descending artery is large and widely patent.  LEFT VENTRICULOGRAM: Left ventricular angiogram was not done. LVEDP was 12 mmHg.  IMPRESSIONS:  1. Mild diffuse coronary artery disease as noted above. 2. LVEDP 12 mmHg.                                 Treatments: See Hospital Course  Discharge Exam: Blood pressure 129/70, pulse 63, temperature 98.3 F (36.8 C), temperature source Oral, resp. rate 18, height 5\' 11"  (1.803 m), weight 163 lb 2.3 oz (74 kg), SpO2 93 %.   Disposition: 01-Home or Self Care      Discharge Instructions    Diet - low sodium heart healthy    Complete by:  As directed      Increase activity slowly    Complete by:  As directed             Medication List    STOP taking these medications        VITAMIN B 12 PO      TAKE these medications        albuterol 108 (90 BASE) MCG/ACT inhaler  Commonly known as:  PROVENTIL HFA;VENTOLIN HFA  Inhale 2 puffs into the lungs every 6 (six) hours as needed for wheezing or shortness of breath.     dutasteride 0.5 MG capsule  Commonly known as:  AVODART  TAKE (1)  CAPSULE DAILY     FLORASTOR 250 MG capsule  Generic drug:  saccharomyces boulardii  TAKE  (1)  CAPSULE  TWICE DAILY.     furosemide 20 MG tablet  Commonly known as:  LASIX  TAKE  (1)  TABLET BY MOUTH AS NEEDED FOR EDEMA     gabapentin 600 MG tablet  Commonly known as:  NEURONTIN  TAKE (1) TABLET BY MOUTH TWICE DAILY.     ipratropium 0.02 % nebulizer solution  Commonly known as:  ATROVENT  NEBULIZE 1 VAIL 4 TIMES A DAY AS DIRECTED     isosorbide mononitrate 30 MG 24 hr tablet  Commonly known as:  IMDUR  Take 1 tablet (30 mg total) by mouth daily.     levalbuterol 0.63 MG/3ML nebulizer solution  Commonly known as:  XOPENEX  NEBULIZE 1 VIAL (WITH IPRATROPIUM) TWICE A DAY AS NEEDED. MAY USE EXTR A EVERY 4 HOURS IF NEEDED     mirtazapine 15 MG tablet  Commonly known as:  REMERON  Take 1 tablet (15 mg total) by mouth at bedtime.     nitroGLYCERIN 0.4 MG SL tablet  Commonly known as:  NITROSTAT  Place 1 tablet (0.4 mg total) under the tongue every 5 (five) minutes x 3 doses as needed for chest pain.     pantoprazole 40 MG tablet  Commonly known as:  PROTONIX  TAKE (1) TABLET TWICE A DAY BEFORE MEALS.     potassium chloride 10 MEQ tablet  Commonly known as:  K-DUR  TAKE (2) TABLETS TWICE DAILY.     simvastatin 20 MG tablet  Commonly known as:  ZOCOR  TAKE ONE TABLET AT BEDTIME     warfarin 1 MG tablet  Commonly known as:  COUMADIN  - Take 2-3 tablets (2-3 mg total) by mouth daily at 6 PM. TAKES 2MG  ON MON, WED AND FRI   - TAKES 3MG  ALL OTHER DAYS       Follow-up Information    Follow up with Ermalinda Barrios, PA-C On 09/26/2014.   Specialty:  Cardiology   Why:  1:45 pm (Dr. Jackalyn Lombard PA)   Contact information:   [address] [place] [address] [phone]       TIME SPENT ON DISCHARGE, INCLUDING PHYSICIAN TIME: >30 MINUTES  Signed: Lyda Jester 09/07/2014, 4:22 PM

## 2014-09-07 NOTE — Progress Notes (Signed)
Patient Profile: Mr. Turano is an 81 y/o male with paroxysmal AF on warfarin, HTN, COPD, and h/o GIB admitted for chest pain eval. NST-->Intermediate risk. Subsequent LHC 09/06/14 showed no significant CAD.   Subjective: No complaints. Denies further CP. No dyspnea. Eager to go home.   Objective: Vital signs in last 24 hours: Temp:  [98.1 F (36.7 C)-98.3 F (36.8 C)] 98.3 F (36.8 C) (04/06 0611) Pulse Rate:  [60-79] 63 (04/06 0611) Resp:  [18] 18 (04/06 0611) BP: (129-173)/(63-99) 129/70 mmHg (04/06 0611) SpO2:  [93 %-97 %] 93 % (04/06 0611) Last BM Date: 09/04/14  Intake/Output from previous day: 04/05 0701 - 04/06 0700 In: 240 [P.O.:240] Out: 500 [Urine:500] Intake/Output this shift:    Medications Current Facility-Administered Medications  Medication Dose Route Frequency Provider Last Rate Last Dose  . acetaminophen (TYLENOL) tablet 650 mg  650 mg Oral Q4H PRN Skeet Latch, MD      . acetaminophen (TYLENOL) tablet 650 mg  650 mg Oral Q4H PRN Jettie Booze, MD      . albuterol (PROVENTIL) (2.5 MG/3ML) 0.083% nebulizer solution 2.5 mg  2.5 mg Nebulization Q6H PRN Skeet Latch, MD      . dutasteride (AVODART) capsule 0.5 mg  0.5 mg Oral Daily Skeet Latch, MD   0.5 mg at 09/06/14 1023  . gabapentin (NEURONTIN) tablet 600 mg  600 mg Oral BID Skeet Latch, MD   600 mg at 09/06/14 2146  . ipratropium (ATROVENT) nebulizer solution 0.25 mg  0.25 mg Nebulization Q6H PRN Skeet Latch, MD      . isosorbide mononitrate (IMDUR) 24 hr tablet 30 mg  30 mg Oral Daily Skeet Latch, MD   30 mg at 09/06/14 1023  . mirtazapine (REMERON) tablet 15 mg  15 mg Oral QHS Skeet Latch, MD   15 mg at 09/06/14 2146  . nitroGLYCERIN (NITROSTAT) SL tablet 0.4 mg  0.4 mg Sublingual Q5 Min x 3 PRN Skeet Latch, MD      . ondansetron [facility]) injection 4 mg  4 mg Intravenous Q6H PRN Skeet Latch, MD      . ondansetron [facility]) injection 4 mg  4 mg Intravenous Q6H  PRN Jettie Booze, MD      . pantoprazole (PROTONIX) EC tablet 40 mg  40 mg Oral BID Skeet Latch, MD   40 mg at 09/06/14 2146  . simvastatin (ZOCOR) tablet 20 mg  20 mg Oral QHS Skeet Latch, MD   20 mg at 09/06/14 2146  . Warfarin - Pharmacist Dosing Inpatient   Does not apply Echo, [facility]        PE: General appearance: alert, cooperative and no distress Neck: no JVD Lungs: clear to auscultation bilaterally Heart: irregularly irregular rhythm Extremities: no LEE Pulses: 2+ and symmetric Skin: warm and dry Neurologic: Grossly normal  Lab Results:   Recent Labs  09/06/14 0603  WBC 4.3  HGB 11.1*  HCT 36.2*  PLT 147*   BMET  Recent Labs  09/06/14 0603 09/07/14 0528  NA 138 141  K 3.6 3.6  CL 109 110  CO2 26 27  GLUCOSE 96 99  BUN 18 16  CREATININE 1.17 1.13  CALCIUM 8.7 8.3*   PT/INR  Recent Labs  09/05/14 0406 09/06/14 0603 09/07/14 0528  LABPROT 22.6* 21.8* 18.8*  INR 1.97* 1.88* 1.56*   Cholesterol No results for input(s): CHOL in the last 72 hours. Cardiac Enzymes Invalid input(s): TROPONIN,  CKMB  Studies/Results: LHC 09/06/14 HEMODYNAMICS: Aortic pressure  was 166/76; LV pressure was 163/1; LVEDP 12. There was no gradient between the left ventricle and aorta.   ANGIOGRAPHIC DATA: The left main coronary artery is a short vessel which is widely patent.  The left anterior descending artery is a large vessel which reaches the apex. There is mild atherosclerosis noted in the mid vessel. There is a large first diagonal which is widely patent..  The left circumflex artery is a medium size vessel. There is a large first obtuse marginal which is widely patent. The remainder of the circumflex is medium-sized with only mild atherosclerosis. The second and third obtuse marginals are small but patent.  The right coronary artery is a large, dominant vessel. There is mild diffuse atherosclerosis in the proximal to mid RCA. The  posterior lateral artery is large and widely patent. The posterior descending artery is large and widely patent.  LEFT VENTRICULOGRAM: Left ventricular angiogram was not done. LVEDP was 12 mmHg.  IMPRESSIONS:  1. Mild diffuse coronary artery disease as noted above. 2. LVEDP 12 mmHg.  Assessment/Plan  Active Problems:   Chest pain   Chronic atrial fibrillation   On warfarin therapy   Abnormal stress test    1. CAD: Currently CP free. LHC yesterday demonstrated mild diffuse, nonobstructive CAD. Continue medical therapy and risk factor reduction. Resume long acting nitrate and simvastatin. Warfarin alone, w/o ASA given h/o GIB.   2. Chronic Atrial Fibrillation: rate is well controlled in the 70s-80s. No AVN blocking agents given issues with bradycardia. Continue warfarin for stroke prophylaxis.   3. Chronic Warfarin Therapy: INR is subtherapeutic at 1.56.  Goal is 2-3.Denies h/o stroke/TIA. No indication for bridge.  Continue dosing per pharmacy. Recheck INR as an OP later this week.   4. HTN: BP well controlled.   Dispo: D/c home later today after MD assessment.    LOS: 3 days    Oluwatobi Visser M. Rosita Fire, PA-C 09/07/2014 8:17 AM

## 2014-09-07 NOTE — Progress Notes (Signed)
Noted order to dc patient home. DC education provided to both patient and wife.Verbalized understanding

## 2014-09-08 ENCOUNTER — Ambulatory Visit (INDEPENDENT_AMBULATORY_CARE_PROVIDER_SITE_OTHER): Payer: Medicare Other | Admitting: General Practice

## 2014-09-08 DIAGNOSIS — Z5181 Encounter for therapeutic drug level monitoring: Secondary | ICD-10-CM

## 2014-09-08 LAB — POCT INR: INR: 1.5

## 2014-09-08 NOTE — Progress Notes (Signed)
Pre visit review using our clinic review tool, if applicable. No additional management support is needed unless otherwise documented below in the visit note. 

## 2014-09-19 ENCOUNTER — Ambulatory Visit: Payer: Medicare Other

## 2014-09-26 ENCOUNTER — Encounter: Payer: Self-pay | Admitting: Physician Assistant

## 2014-09-26 ENCOUNTER — Ambulatory Visit (HOSPITAL_COMMUNITY): Payer: Medicare Other | Attending: Family Medicine | Admitting: Cardiology

## 2014-09-26 ENCOUNTER — Ambulatory Visit (INDEPENDENT_AMBULATORY_CARE_PROVIDER_SITE_OTHER): Payer: Medicare Other | Admitting: Physician Assistant

## 2014-09-26 VITALS — BP 128/72 | HR 64 | Ht 71.0 in | Wt 174.0 lb

## 2014-09-26 DIAGNOSIS — R0989 Other specified symptoms and signs involving the circulatory and respiratory systems: Secondary | ICD-10-CM | POA: Insufficient documentation

## 2014-09-26 DIAGNOSIS — I482 Chronic atrial fibrillation, unspecified: Secondary | ICD-10-CM

## 2014-09-26 DIAGNOSIS — I10 Essential (primary) hypertension: Secondary | ICD-10-CM | POA: Diagnosis not present

## 2014-09-26 DIAGNOSIS — I6523 Occlusion and stenosis of bilateral carotid arteries: Secondary | ICD-10-CM | POA: Diagnosis not present

## 2014-09-26 DIAGNOSIS — I2510 Atherosclerotic heart disease of native coronary artery without angina pectoris: Secondary | ICD-10-CM | POA: Diagnosis not present

## 2014-09-26 NOTE — Progress Notes (Signed)
Carotid duplex performed 

## 2014-09-26 NOTE — Assessment & Plan Note (Signed)
She has nonobstructive CAD on cardiac cath on 09/06/14 after admission with chest pain and abnormal nuclear stress test. Patient was placed on Imdur were and had no further chest pain or problems. Continue current treatment.

## 2014-09-26 NOTE — Progress Notes (Signed)
Cardiology Office Note   Date:  09/26/2014   ID:  [name], DOB [birth year], MRN [id number]  PCP:  [name], MD  Cardiologist: Thompson Grayer, MD   Chief Complaint: Shortness of breath    History of Present Illness: [name] is a 81 y.o. male who presents for post  hospital follow-up. He has a history of chronic atrial fibrillation on Coumadin, hypertension, COPD and history of GI bleed. He was admitted with chest pain and had negative cardiac enzymes 3. Nuclear stress test  was intermediate risk and cardiac cath was recommended. This was performed 09/06/14 and showed nonobstructive CAD. Continued medical therapy and risk factor reduction was recommended. He was continued on simvastatin and Imdur  and warfarin without ASA because of history of GI bleed.  Patient comes in today feeling quite well. He is tired and fatigued but overall doing better. He has had no further chest pain, dizziness or presyncope. He has chronic dyspnea on exertion secondary to his COPD that is unchanged.      Past Medical History  Diagnosis Date  . History of upper gastrointestinal hemorrhage     dr. Maurene Capes, GI  . Cerebrovascular accident 45  . Gastric polyp   . Dysphagia, unspecified(787.20)   . Coronary artery disease     a. Nonobstructive by cath 2009.  [name] Permanent atrial fibrillation     INR followed at [facility].  [name] Hypertension   . Esophagitis   . GERD (gastroesophageal reflux disease)   . Erosive gastritis     H/o GIB felt secondary to this  . Gastric ulcer   . Hiatal hernia   . Internal hemorrhoids   . Hearing impairment   . Vision problems   . Chronic bronchitis     a. h/o resp failure in setting of bronchitis vs PNA 05/2012.  [name] Hyperlipidemia   . ASD (atrial septal defect)     a. s/p repair 1983.  [name] BPH (benign prostatic hyperplasia)   . COPD (chronic obstructive pulmonary disease)   . Anemia   . Depression   . LV dysfunction     a. EF 45-50% by echo 05/2012.  [name]  Chronic diarrhea     Past Surgical History  Procedure Laterality Date  . Cholecystectomy  1990s  . Asd repair  [id number]  . Inguinal hernia repair  1963  . Partial small bowel resection  2005    ischemic?  [name] Enteroscopy  05/13/2012    Procedure: ENTEROSCOPY;  Surgeon: Inda Castle, MD;  Location: [facility] ENDOSCOPY;  Service: Endoscopy;  Laterality: N/A;  . Cardioversion      possibly 3 times, dr. brody/cooper  . Left heart catheterization with coronary angiogram N/A 09/06/2014    Procedure: LEFT HEART CATHETERIZATION WITH CORONARY ANGIOGRAM;  Surgeon: Jettie Booze, MD;  Location: [facility] CATH LAB;  Service: Cardiovascular;  Laterality: N/A;     Current Outpatient Prescriptions  Medication Sig Dispense Refill  . albuterol (PROVENTIL HFA;VENTOLIN HFA) 108 (90 BASE) MCG/ACT inhaler Inhale 2 puffs into the lungs every 6 (six) hours as needed for wheezing or shortness of breath. 1 Inhaler 2  . dutasteride (AVODART) 0.5 MG capsule TAKE (1) CAPSULE DAILY 30 capsule 5  . FLORASTOR 250 MG capsule TAKE  (1)  CAPSULE  TWICE DAILY. 50 capsule 3  . furosemide (LASIX) 20 MG tablet TAKE  (1)  TABLET BY MOUTH AS NEEDED FOR EDEMA (Patient taking differently: Take 20 mg by mouth 2 (two) times daily. Take one tablet  in there morning and one tablet at night.) 180 tablet 2  . gabapentin (NEURONTIN) 600 MG tablet TAKE (1) TABLET BY MOUTH TWICE DAILY. 90 tablet 2  . ipratropium (ATROVENT) 0.02 % nebulizer solution NEBULIZE 1 VAIL 4 TIMES A DAY AS DIRECTED 312.5 mL 0  . isosorbide mononitrate (IMDUR) 30 MG 24 hr tablet Take 1 tablet (30 mg total) by mouth daily. 30 tablet 6  . levalbuterol (XOPENEX) 0.63 MG/3ML nebulizer solution NEBULIZE 1 VIAL (WITH IPRATROPIUM) TWICE A DAY AS NEEDED. MAY USE EXTR A EVERY 4 HOURS IF NEEDED 216 mL 5  . mirtazapine (REMERON) 15 MG tablet Take 1 tablet (15 mg total) by mouth at bedtime. 90 tablet 2  . nitroGLYCERIN (NITROSTAT) 0.4 MG SL tablet Place 1 tablet (0.4 mg total) under the  tongue every 5 (five) minutes x 3 doses as needed for chest pain. 25 tablet 3  . pantoprazole (PROTONIX) 40 MG tablet TAKE (1) TABLET TWICE A DAY BEFORE MEALS. (Patient taking differently: TAKE (1) TABLET TWICE A DAY BEFORE MEALS AS NEEDED) 60 tablet 2  . potassium chloride (K-DUR) 10 MEQ tablet TAKE (2) TABLETS TWICE DAILY. 120 tablet 1  . simvastatin (ZOCOR) 20 MG tablet TAKE ONE TABLET AT BEDTIME 30 tablet 11  . warfarin (COUMADIN) 1 MG tablet Take 2-3 tablets (2-3 mg total) by mouth daily at 6 PM. TAKES 2MG  ON MON, WED AND FRI  TAKES 3MG  ALL OTHER DAYS 80 tablet 3   No current facility-administered medications for this visit.    Allergies:   Penicillins    Social History:  The patient  reports that he quit smoking about 31 years ago. His smoking use included Cigarettes. He has a 20 pack-year smoking history. He has never used smokeless tobacco. He reports that he does not drink alcohol or use illicit drugs.   Family History:  The patient's family history includes Heart disease in his father and mother; Prostate cancer in his brother.    ROS:  Please see the history of present illness.   Otherwise, review of systems are positive for hearing loss.   All other systems are reviewed and negative.    PHYSICAL EXAM: VS:  BP 128/72 mmHg  Pulse 64  Ht 5\' 11"  (1.803 m)  Wt 174 lb (78.926 kg)  BMI 24.28 kg/m2 , BMI Body mass index is 24.28 kg/(m^2). GEN: Well nourished, well developed, in no acute distress Neck: Right carotid bruit, no JVD, HJR, or masses Cardiac: Irregular irregular; 1/6 systolic murmur at the left sternal border, no gallop, rubs, thrill or heave,  Respiratory:  clear to auscultation bilaterally, normal work of breathing GI: soft, nontender, nondistended, + BS MS: no deformity or atrophy Extremities: Right arm at cath site without hematoma or hemorrhage, good radial and brachial pulses, lower extremities without cyanosis, clubbing, edema, good distal pulses bilaterally.   Skin: warm and dry, no rash Neuro:  Strength and sensation are intact    EKG:  EKG is ordered today. The ekg ordered today demonstrates A. fib at 64 bpm with right bundle branch block Recent Labs: 09/03/2014: B Natriuretic Peptide 55.6 09/06/2014: Hemoglobin 11.1*; Platelets 147* 09/07/2014: BUN 16; Creatinine 1.13; Potassium 3.6; Sodium 141    Lipid Panel    Component Value Date/Time   CHOL 121 09/04/2014 0614   TRIG 134 09/04/2014 0614   HDL 42 09/04/2014 0614   CHOLHDL 2.9 09/04/2014 0614   VLDL 27 09/04/2014 0614   LDLCALC 52 09/04/2014 0614   LDLDIRECT 48.8 07/30/2013 1551  Wt Readings from Last 3 Encounters:  09/26/14 174 lb (78.926 kg)  09/05/14 163 lb 2.3 oz (74 kg)  08/19/14 167 lb (75.751 kg)      Other studies Reviewed: Additional studies/ records that were reviewed today include and review of the records demonstrates:   NST 09/05/14 IMPRESSION: 1. Large in size, moderate in intensity defect noted in the mid and apical inferolateral, anteroapical and inferoapical walls. This defect is primarily fixed with a possible small area of reversibility in the apex. There is a small in size, moderate in intensity fixed defect in the basal inferoseptum. Cannot rule out a very small area of ischemia in the apex. There is an area of increased uptake of radiotracer in the left forearm.  2. Normal left ventricular wall motion.  3. Left ventricular ejection fraction 63%  4. Intermediaterisk stress test findings*.  LHC 09/06/14 HEMODYNAMICS:  Aortic pressure was 166/76; LV pressure was 163/1; LVEDP 12.  There was no gradient between the left ventricle and aorta.    ANGIOGRAPHIC DATA:   The left main coronary artery is a short vessel which is widely patent.  The left anterior descending artery is a large vessel which reaches the apex. There is mild atherosclerosis noted in the mid vessel. There is a large first diagonal which is widely patent..  The left circumflex  artery is a medium size vessel. There is a large first obtuse marginal which is widely patent. The remainder of the circumflex is medium-sized with only mild atherosclerosis. The second and third obtuse marginals are small but patent.  The right coronary artery is a large, dominant vessel. There is mild diffuse atherosclerosis in the proximal to mid RCA. The posterior lateral artery is large and widely patent. The posterior descending artery is large and widely patent.  LEFT VENTRICULOGRAM:  Left ventricular angiogram was not done.  LVEDP was 12 mmHg.  IMPRESSIONS:    1. Mild diffuse coronary artery disease as noted above. 2. LVEDP 12 mmHg.     ASSESSMENT AND PLAN:  Coronary atherosclerosis She has nonobstructive CAD on cardiac cath on 09/06/14 after admission with chest pain and abnormal nuclear stress test. Patient was placed on Imdur were and had no further chest pain or problems. Continue current treatment.   Chronic atrial fibrillation Patient's rate is controlled, continue Coumadin   Essential hypertension Controlled   Right carotid bruit Check carotid Dopplers     Signed, Ermalinda Barrios, PA-C  09/26/2014 2:29 PM    [facility] [address], [place], [place]  [address] Phone: [phone]; Fax: [phone]

## 2014-09-26 NOTE — Assessment & Plan Note (Signed)
Controlled.  

## 2014-09-26 NOTE — Patient Instructions (Addendum)
Medication Instructions:  Your physician recommends that you continue on your current medications as directed. Please refer to the Current Medication list given to you today.   Labwork: NONE TODAY   Testing/Procedures: Your physician has requested that you have a carotid duplex. TODAY.Marland KitchenThis test is an ultrasound of the carotid arteries in your neck. It looks at blood flow through these arteries that supply the brain with blood. Allow one hour for this exam. There are no restrictions or special instructions.    Follow-Up: Your physician wants you to follow-up in:  [place] will receive a reminder letter in the mail two months in advance. If you don't receive a letter, please call our office to schedule the follow-up appointment.   Any Other Special Instructions Will Be Listed Below (If Applicable).

## 2014-09-26 NOTE — Assessment & Plan Note (Signed)
Check carotid Dopplers °

## 2014-09-26 NOTE — Assessment & Plan Note (Signed)
Patient's rate is controlled, continue Coumadin

## 2014-09-27 ENCOUNTER — Encounter: Payer: Self-pay | Admitting: Nurse Practitioner

## 2014-09-27 ENCOUNTER — Ambulatory Visit (INDEPENDENT_AMBULATORY_CARE_PROVIDER_SITE_OTHER): Payer: Medicare Other | Admitting: Nurse Practitioner

## 2014-09-27 VITALS — BP 119/70 | HR 85 | Temp 97.0°F | Ht 71.0 in | Wt 169.0 lb

## 2014-09-27 DIAGNOSIS — M10071 Idiopathic gout, right ankle and foot: Secondary | ICD-10-CM

## 2014-09-27 DIAGNOSIS — M109 Gout, unspecified: Secondary | ICD-10-CM

## 2014-09-27 MED ORDER — COLCHICINE 0.6 MG PO TABS: ORAL_TABLET | ORAL | Status: DC

## 2014-09-27 NOTE — Progress Notes (Addendum)
   Subjective:    Patient ID: [name], male    DOB: 1932-08-31, 81 y.o.   MRN: [id number]  HPI Patient in today c/o right toe pain. Started getting like that Sunday evening. Hurts for anything to touch it.    Review of Systems  Constitutional: Negative.   HENT: Negative.   Respiratory: Negative.   Cardiovascular: Negative.   Genitourinary: Negative.   Neurological: Negative.   Psychiatric/Behavioral: Negative.   All other systems reviewed and are negative.      Objective:   Physical Exam  Constitutional: He is oriented to person, place, and time. He appears well-developed and well-nourished. No distress.  Cardiovascular: Normal rate and regular rhythm.   Pulmonary/Chest: Effort normal and breath sounds normal.  Musculoskeletal:  Right great toe erythematous and swollen- warm and very tender to touch.  Neurological: He is alert and oriented to person, place, and time.  Skin: Skin is warm.  Psychiatric: He has a normal mood and affect. His behavior is normal. Judgment and thought content normal.   BP 119/70 mmHg  Pulse 85  Temp(Src) 97 F (36.1 C) (Oral)  Ht 5\' 11"  (1.803 m)  Wt 169 lb (76.658 kg)  BMI 23.58 kg/m2        Assessment & Plan:  1. Acute gout of right foot, unspecified cause Labs pending - colchicine 0.6 MG tablet; 2 po now and may repeat in 1 po in 1 hour if needed- no more then 3 in 24 hours  Dispense: 30 tablet; Refill: 0 - Uric acid   Mary-Margaret Hassell Done, FNP

## 2014-09-27 NOTE — Patient Instructions (Signed)

## 2014-09-28 LAB — URIC ACID: Uric Acid: 9.5 mg/dL — ABNORMAL HIGH (ref 3.7–8.6)

## 2014-10-06 ENCOUNTER — Ambulatory Visit (INDEPENDENT_AMBULATORY_CARE_PROVIDER_SITE_OTHER): Payer: Medicare Other | Admitting: Family Medicine

## 2014-10-06 ENCOUNTER — Telehealth: Payer: Self-pay | Admitting: *Deleted

## 2014-10-06 ENCOUNTER — Encounter: Payer: Self-pay | Admitting: Family Medicine

## 2014-10-06 ENCOUNTER — Ambulatory Visit (INDEPENDENT_AMBULATORY_CARE_PROVIDER_SITE_OTHER): Payer: Medicare Other | Admitting: General Practice

## 2014-10-06 VITALS — BP 118/68 | HR 71 | Temp 98.2°F | Wt 166.0 lb

## 2014-10-06 DIAGNOSIS — Z5181 Encounter for therapeutic drug level monitoring: Secondary | ICD-10-CM | POA: Diagnosis not present

## 2014-10-06 DIAGNOSIS — R21 Rash and other nonspecific skin eruption: Secondary | ICD-10-CM | POA: Diagnosis not present

## 2014-10-06 LAB — POCT INR: INR: 2.4

## 2014-10-06 MED ORDER — MUPIROCIN 2 % EX OINT: TOPICAL_OINTMENT | CUTANEOUS | Status: DC

## 2014-10-06 NOTE — Patient Instructions (Signed)
Avoid scratching face Use topical antibiotic twice daily Let me know if not healing in 3 weeks.

## 2014-10-06 NOTE — Progress Notes (Signed)
Pre visit review using our clinic review tool, if applicable. No additional management support is needed unless otherwise documented below in the visit note. 

## 2014-10-06 NOTE — Telephone Encounter (Signed)
-----   Message from Imogene Burn, PA-C sent at 09/28/2014 10:02 AM EDT ----- Carotids ok. F/u 1 yr.  1-39% RICA stenosis.  47-07% LICA stenosis.  Normal subclavian arteries, bilaterally.  Patent vertebral arteries with antegrade flow.  f/u 1 year. Observations:

## 2014-10-06 NOTE — Progress Notes (Signed)
Subjective:    Patient ID: [name], male    DOB: Apr 02, 1933, 81 y.o.   MRN: [id number]  HPI Patient seen for acute visit. He's had area of irritation right temporal region for the past several weeks. His wife noticed that he frequently scratches this area. He has some mild itching but no pain. This area has bled once or twice. He is not aware of any prior history of skin cancer.  Not using anything topically.  Past Medical History  Diagnosis Date  . History of upper gastrointestinal hemorrhage     dr. Maurene Capes, GI  . Cerebrovascular accident 30  . Gastric polyp   . Dysphagia, unspecified(787.20)   . Coronary artery disease     a. Nonobstructive by cath 2009.  [name] Permanent atrial fibrillation     INR followed at [facility].  [name] Hypertension   . Esophagitis   . GERD (gastroesophageal reflux disease)   . Erosive gastritis     H/o GIB felt secondary to this  . Gastric ulcer   . Hiatal hernia   . Internal hemorrhoids   . Hearing impairment   . Vision problems   . Chronic bronchitis     a. h/o resp failure in setting of bronchitis vs PNA 05/2012.  [name] Hyperlipidemia   . ASD (atrial septal defect)     a. s/p repair 1983.  [name] BPH (benign prostatic hyperplasia)   . COPD (chronic obstructive pulmonary disease)   . Anemia   . Depression   . LV dysfunction     a. EF 45-50% by echo 05/2012.  [name] Chronic diarrhea    Past Surgical History  Procedure Laterality Date  . Cholecystectomy  1990s  . Asd repair  [id number]  . Inguinal hernia repair  1963  . Partial small bowel resection  2005    ischemic?  [name] Enteroscopy  05/13/2012    Procedure: ENTEROSCOPY;  Surgeon: Inda Castle, MD;  Location: [facility] ENDOSCOPY;  Service: Endoscopy;  Laterality: N/A;  . Cardioversion      possibly 3 times, dr. brody/cooper  . Left heart catheterization with coronary angiogram N/A 09/06/2014    Procedure: LEFT HEART CATHETERIZATION WITH CORONARY ANGIOGRAM;  Surgeon: Jettie Booze, MD;  Location: [facility] CATH LAB;   Service: Cardiovascular;  Laterality: N/A;    reports that he quit smoking about 31 years ago. His smoking use included Cigarettes. He has a 20 pack-year smoking history. He has never used smokeless tobacco. He reports that he does not drink alcohol or use illicit drugs. family history includes Heart disease in his father and mother; Prostate cancer in his brother. Allergies  Allergen Reactions  . Penicillins Hives      Review of Systems  Constitutional: Negative for appetite change and unexpected weight change.  Skin: Positive for rash.       Objective:   Physical Exam  Constitutional: He appears well-developed and well-nourished.  Cardiovascular: Normal rate.   Pulmonary/Chest: Effort normal and breath sounds normal.  Skin: Rash noted.  1.5 cm area of mild erythema right temporal region. near center he has approximately 5 mm area of denuded epithelium. No nodular growth. No ulceration. No necrosis. No cellulitis changes. Nontender.          Assessment & Plan:  Skin rash right temporal area. Suspect largely this is not healing because of recurrent. Scratching. Avoid scratching altogether. Bactroban ointment twice daily. If this is not healing in 3 weeks consider referral to skin surgery Center for consideration  of biopsy. Clinically, doubt squamous cell or basal cell

## 2014-11-10 ENCOUNTER — Ambulatory Visit (INDEPENDENT_AMBULATORY_CARE_PROVIDER_SITE_OTHER): Payer: Medicare Other | Admitting: General Practice

## 2014-11-10 DIAGNOSIS — I4891 Unspecified atrial fibrillation: Secondary | ICD-10-CM

## 2014-11-10 DIAGNOSIS — Z5181 Encounter for therapeutic drug level monitoring: Secondary | ICD-10-CM

## 2014-11-10 LAB — POCT INR: INR: 1.6

## 2014-11-10 NOTE — Progress Notes (Signed)
Pre visit review using our clinic review tool, if applicable. No additional management support is needed unless otherwise documented below in the visit note. 

## 2014-11-14 ENCOUNTER — Other Ambulatory Visit: Payer: Self-pay | Admitting: Internal Medicine

## 2014-11-24 ENCOUNTER — Ambulatory Visit (INDEPENDENT_AMBULATORY_CARE_PROVIDER_SITE_OTHER): Payer: Medicare Other | Admitting: General Practice

## 2014-11-24 DIAGNOSIS — Z5181 Encounter for therapeutic drug level monitoring: Secondary | ICD-10-CM | POA: Diagnosis not present

## 2014-11-24 DIAGNOSIS — I4891 Unspecified atrial fibrillation: Secondary | ICD-10-CM

## 2014-11-24 LAB — POCT INR: INR: 2

## 2014-11-24 NOTE — Progress Notes (Signed)
Pre visit review using our clinic review tool, if applicable. No additional management support is needed unless otherwise documented below in the visit note. 

## 2014-11-28 ENCOUNTER — Other Ambulatory Visit: Payer: Self-pay | Admitting: Family Medicine

## 2014-12-03 ENCOUNTER — Other Ambulatory Visit: Payer: Self-pay | Admitting: Family Medicine

## 2014-12-06 ENCOUNTER — Other Ambulatory Visit: Payer: Self-pay | Admitting: General Practice

## 2014-12-06 MED ORDER — WARFARIN SODIUM 1 MG PO TABS: ORAL_TABLET | ORAL | Status: DC

## 2014-12-22 ENCOUNTER — Ambulatory Visit (INDEPENDENT_AMBULATORY_CARE_PROVIDER_SITE_OTHER): Payer: Medicare Other | Admitting: General Practice

## 2014-12-22 DIAGNOSIS — I4891 Unspecified atrial fibrillation: Secondary | ICD-10-CM | POA: Diagnosis not present

## 2014-12-22 DIAGNOSIS — Z5181 Encounter for therapeutic drug level monitoring: Secondary | ICD-10-CM | POA: Diagnosis not present

## 2014-12-22 LAB — POCT INR: INR: 2.4

## 2014-12-22 NOTE — Progress Notes (Signed)
Pre visit review using our clinic review tool, if applicable. No additional management support is needed unless otherwise documented below in the visit note. 

## 2014-12-23 ENCOUNTER — Other Ambulatory Visit: Payer: Self-pay | Admitting: Nurse Practitioner

## 2014-12-26 ENCOUNTER — Other Ambulatory Visit: Payer: Self-pay | Admitting: *Deleted

## 2014-12-26 MED ORDER — FUROSEMIDE 20 MG PO TABS: ORAL_TABLET | ORAL | Status: DC

## 2015-01-19 ENCOUNTER — Ambulatory Visit: Payer: Medicare Other

## 2015-01-26 ENCOUNTER — Ambulatory Visit (INDEPENDENT_AMBULATORY_CARE_PROVIDER_SITE_OTHER): Payer: Medicare Other | Admitting: General Practice

## 2015-01-26 DIAGNOSIS — I4891 Unspecified atrial fibrillation: Secondary | ICD-10-CM | POA: Diagnosis not present

## 2015-01-26 DIAGNOSIS — Z5181 Encounter for therapeutic drug level monitoring: Secondary | ICD-10-CM

## 2015-01-26 LAB — POCT INR: INR: 1.4

## 2015-01-26 NOTE — Progress Notes (Signed)
Agree with coumadin management. 

## 2015-01-26 NOTE — Progress Notes (Signed)
Pre visit review using our clinic review tool, if applicable. No additional management support is needed unless otherwise documented below in the visit note. 

## 2015-01-27 ENCOUNTER — Encounter (HOSPITAL_COMMUNITY): Payer: Self-pay | Admitting: Emergency Medicine

## 2015-01-27 ENCOUNTER — Observation Stay (HOSPITAL_COMMUNITY)
Admission: EM | Admit: 2015-01-27 | Discharge: 2015-01-28 | Disposition: A | Discharge status: Home / Self Care | Payer: Medicare Other | Attending: Internal Medicine | Admitting: Internal Medicine

## 2015-01-27 ENCOUNTER — Emergency Department (HOSPITAL_COMMUNITY): Payer: Medicare Other

## 2015-01-27 DIAGNOSIS — I482 Chronic atrial fibrillation: Secondary | ICD-10-CM | POA: Insufficient documentation

## 2015-01-27 DIAGNOSIS — R0789 Other chest pain: Secondary | ICD-10-CM | POA: Diagnosis not present

## 2015-01-27 DIAGNOSIS — Z8673 Personal history of transient ischemic attack (TIA), and cerebral infarction without residual deficits: Secondary | ICD-10-CM | POA: Diagnosis not present

## 2015-01-27 DIAGNOSIS — Z7901 Long term (current) use of anticoagulants: Secondary | ICD-10-CM | POA: Diagnosis not present

## 2015-01-27 DIAGNOSIS — J449 Chronic obstructive pulmonary disease, unspecified: Secondary | ICD-10-CM | POA: Diagnosis not present

## 2015-01-27 DIAGNOSIS — Z87891 Personal history of nicotine dependence: Secondary | ICD-10-CM | POA: Insufficient documentation

## 2015-01-27 DIAGNOSIS — I5022 Chronic systolic (congestive) heart failure: Secondary | ICD-10-CM | POA: Insufficient documentation

## 2015-01-27 DIAGNOSIS — R627 Adult failure to thrive: Secondary | ICD-10-CM | POA: Diagnosis not present

## 2015-01-27 DIAGNOSIS — D649 Anemia, unspecified: Secondary | ICD-10-CM | POA: Insufficient documentation

## 2015-01-27 DIAGNOSIS — M109 Gout, unspecified: Secondary | ICD-10-CM | POA: Insufficient documentation

## 2015-01-27 DIAGNOSIS — E785 Hyperlipidemia, unspecified: Secondary | ICD-10-CM | POA: Insufficient documentation

## 2015-01-27 DIAGNOSIS — Z23 Encounter for immunization: Secondary | ICD-10-CM | POA: Insufficient documentation

## 2015-01-27 DIAGNOSIS — I10 Essential (primary) hypertension: Secondary | ICD-10-CM | POA: Diagnosis not present

## 2015-01-27 DIAGNOSIS — K219 Gastro-esophageal reflux disease without esophagitis: Secondary | ICD-10-CM | POA: Diagnosis not present

## 2015-01-27 DIAGNOSIS — F329 Major depressive disorder, single episode, unspecified: Secondary | ICD-10-CM | POA: Insufficient documentation

## 2015-01-27 DIAGNOSIS — I2510 Atherosclerotic heart disease of native coronary artery without angina pectoris: Secondary | ICD-10-CM | POA: Insufficient documentation

## 2015-01-27 DIAGNOSIS — N400 Enlarged prostate without lower urinary tract symptoms: Secondary | ICD-10-CM | POA: Insufficient documentation

## 2015-01-27 DIAGNOSIS — R079 Chest pain, unspecified: Secondary | ICD-10-CM | POA: Diagnosis present

## 2015-01-27 DIAGNOSIS — R739 Hyperglycemia, unspecified: Secondary | ICD-10-CM | POA: Diagnosis not present

## 2015-01-27 DIAGNOSIS — Z79899 Other long term (current) drug therapy: Secondary | ICD-10-CM | POA: Diagnosis not present

## 2015-01-27 DIAGNOSIS — I4891 Unspecified atrial fibrillation: Secondary | ICD-10-CM | POA: Diagnosis present

## 2015-01-27 LAB — BASIC METABOLIC PANEL
Anion gap: 7 (ref 5–15)
BUN: 19 mg/dL (ref 6–20)
CALCIUM: 8.5 mg/dL — AB (ref 8.9–10.3)
CHLORIDE: 104 mmol/L (ref 101–111)
CO2: 27 mmol/L (ref 22–32)
CREATININE: 1.38 mg/dL — AB (ref 0.61–1.24)
GFR calc Af Amer: 54 mL/min — ABNORMAL LOW (ref >60)
GFR calc non Af Amer: 47 mL/min — ABNORMAL LOW (ref >60)
GLUCOSE: 130 mg/dL — AB (ref 65–99)
Potassium: 4.9 mmol/L (ref 3.5–5.1)
Sodium: 138 mmol/L (ref 135–145)

## 2015-01-27 LAB — CBC
HEMATOCRIT: 37.3 % — AB (ref 39.0–52.0)
HEMOGLOBIN: 11.4 g/dL — AB (ref 13.0–17.0)
MCH: 25.2 pg — ABNORMAL LOW (ref 26.0–34.0)
MCHC: 30.6 g/dL (ref 30.0–36.0)
MCV: 82.5 fL (ref 78.0–100.0)
Platelets: 151 10*3/uL (ref 150–400)
RBC: 4.52 MIL/uL (ref 4.22–5.81)
RDW: 16.4 % — AB (ref 11.5–15.5)
WBC: 4.6 10*3/uL (ref 4.0–10.5)

## 2015-01-27 LAB — I-STAT TROPONIN, ED: Troponin i, poc: 0 ng/mL (ref 0.00–0.08)

## 2015-01-27 LAB — PROTIME-INR
INR: 1.53 — ABNORMAL HIGH (ref 0.00–1.49)
Prothrombin Time: 18.5 seconds — ABNORMAL HIGH (ref 11.6–15.2)

## 2015-01-27 NOTE — ED Provider Notes (Signed)
CSN: [id number]     Arrival date & time 01/27/15  1933 History   First MD Initiated Contact with Patient 01/27/15 2010     Chief Complaint  Patient presents with  . Chest Pain     (Consider location/radiation/quality/duration/timing/severity/associated sxs/prior Treatment) Patient is a 81 y.o. male presenting with chest pain.  Chest Pain  Complains of anterior chest pain left-sided, constant since approximately noon today pain is mild at present. No treatment prior to coming here he states it was worse a few hours ago. Discomfort is minimal at present. Nothing makes symptoms better or worse. Pain is dull. No associated shortness of breath nausea or sweatiness no other associated symptoms. Past Medical History  Diagnosis Date  . History of upper gastrointestinal hemorrhage     dr. Maurene Capes, GI  . Cerebrovascular accident 109  . Gastric polyp   . Dysphagia, unspecified(787.20)   . Coronary artery disease     a. Nonobstructive by cath 2009.  [name] Permanent atrial fibrillation     INR [facility] at [facility].  [name] Hypertension   . Esophagitis   . GERD (gastroesophageal reflux disease)   . Erosive gastritis     H/o GIB felt secondary to this  . Gastric ulcer   . Hiatal hernia   . Internal hemorrhoids   . Hearing impairment   . Vision problems   . Chronic bronchitis     a. h/o resp failure in setting of bronchitis vs PNA 05/2012.  [name] Hyperlipidemia   . ASD (atrial septal defect)     a. s/p repair 1983.  [name] BPH (benign prostatic hyperplasia)   . COPD (chronic obstructive pulmonary disease)   . Anemia   . Depression   . LV dysfunction     a. EF 45-50% by echo 05/2012.  [name] Chronic diarrhea    Past Surgical History  Procedure Laterality Date  . Cholecystectomy  1990s  . Asd repair  [id number]  . Inguinal hernia repair  1963  . Partial small bowel resection  2005    ischemic?  [name] Enteroscopy  05/13/2012    Procedure: ENTEROSCOPY;  Surgeon: Inda Castle, MD;  Location: [facility] ENDOSCOPY;  Service:  Endoscopy;  Laterality: N/A;  . Cardioversion      possibly 3 times, dr. brody/cooper  . Left heart catheterization with coronary angiogram N/A 09/06/2014    Procedure: LEFT HEART CATHETERIZATION WITH CORONARY ANGIOGRAM;  Surgeon: Jettie Booze, MD;  Location: [facility] CATH LAB;  Service: Cardiovascular;  Laterality: N/A;   Family History  Problem Relation Age of Onset  . Heart disease Mother   . Heart disease Father   . Prostate cancer Brother    Social History  Substance Use Topics  . Smoking status: Former Smoker -- 0.00 packs/day for 0 years    Types: Cigarettes    Quit date: 06/13/1983  . Smokeless tobacco: Never Used  . Alcohol Use: No    Review of Systems  Constitutional: Negative.   HENT: Negative.   Respiratory: Negative.   Cardiovascular: Positive for chest pain.  Gastrointestinal: Negative.   Musculoskeletal: Negative.   Skin: Negative.   Neurological: Negative.   Psychiatric/Behavioral: Negative.   All other systems reviewed and are negative.     Allergies  Penicillins  Home Medications   Prior to Admission medications   Medication Sig Start Date End Date Taking? Authorizing Provider  isosorbide mononitrate (IMDUR) 30 MG 24 hr tablet Take 1 tablet (30 mg total) by mouth daily. 08/20/14  Yes Lisette Grinder  Sharolyn Douglas, NP  nitroGLYCERIN (NITROSTAT) 0.4 MG SL tablet Place 1 tablet (0.4 mg total) under the tongue every 5 (five) minutes x 3 doses as needed for chest pain. 08/20/14  Yes Rogelia Mire, NP  warfarin (COUMADIN) 1 MG tablet TAKES 2MG  ON MON, WED AND FRI TAKES 3MG  ALL OTHER DAYS Patient taking differently: Take 2-3 mg by mouth daily at 6 PM. 6 mg on 01-26-15, 3 mg 01-27-15 then going back to 2 mg on Monday and Friday and 3 mg all other days. 12/06/14  Yes [name], MD  albuterol (PROVENTIL HFA;VENTOLIN HFA) 108 (90 BASE) MCG/ACT inhaler Inhale 2 puffs into the lungs every 6 (six) hours as needed for wheezing or shortness of breath. 04/27/14   [name], MD  colchicine 0.6 MG tablet 2 po now and may repeat in 1 po in 1 hour if needed- no more then 3 in 24 hours 09/27/14   Mary-Margaret Hassell Done, FNP  dutasteride (AVODART) 0.5 MG capsule TAKE (1) CAPSULE DAILY 08/29/14   [name], MD  FLORASTOR 250 MG capsule TAKE  (1)  CAPSULE  TWICE DAILY.    [name], MD  furosemide (LASIX) 20 MG tablet TAKE ONE TABLET TWICE A DAY AS NEEDED FOR EDEMA 12/26/14   [name], MD  gabapentin (NEURONTIN) 600 MG tablet TAKE (1) TABLET BY MOUTH TWICE DAILY. 08/20/14   Rogelia Mire, NP  ipratropium (ATROVENT) 0.02 % nebulizer solution NEBULIZE 1 VAIL 4 TIMES A DAY AS DIRECTED 11/14/14   Tanda Rockers, MD  levalbuterol (XOPENEX) 0.63 MG/3ML nebulizer solution NEBULIZE 1 VIAL (WITH IPRATROPIUM) TWICE A DAY AS NEEDED. MAY USE EXTR A EVERY 4 HOURS IF NEEDED 06/20/14   [name], MD  mirtazapine (REMERON) 15 MG tablet Take 1 tablet (15 mg total) by mouth at bedtime. 08/20/14   Rogelia Mire, NP  mupirocin ointment (BACTROBAN) 2 % Apply to affected rash twice daily 10/06/14   [name], MD  pantoprazole (PROTONIX) 40 MG tablet TAKE (1) TABLET TWICE A DAY BEFORE MEALS. Patient taking differently: TAKE (1) TABLET TWICE A DAY BEFORE MEALS AS NEEDED 04/25/14   [name], MD  potassium chloride (K-DUR) 10 MEQ tablet Take 40 mEq by mouth daily. 01/02/15   Historical Provider, MD  potassium chloride (K-DUR,KLOR-CON) 10 MEQ tablet TAKE (2) TABLETS TWICE DAILY. 11/28/14   Marletta Lor, MD  simvastatin (ZOCOR) 20 MG tablet TAKE ONE TABLET AT BEDTIME 02/10/14   [name], MD   BP 163/77 mmHg  Pulse 77  Temp(Src) 98.2 F (36.8 C) (Oral)  Resp 14  Ht 5\' 11"  (1.803 m)  Wt 175 lb (79.379 kg)  BMI 24.42 kg/m2  SpO2 91% Physical Exam  Constitutional: He appears well-developed and well-nourished.  HENT:  Head: Normocephalic and atraumatic.  Eyes: Conjunctivae are normal. Pupils are equal, round, and reactive  to light.  Neck: Neck supple. No tracheal deviation present. No thyromegaly present.  Cardiovascular: Normal rate.   No murmur heard. Irregularly irregular  Pulmonary/Chest: Effort normal and breath sounds normal.  Abdominal: Soft. Bowel sounds are normal. He exhibits no distension. There is no tenderness.  Musculoskeletal: Normal range of motion. He exhibits no edema or tenderness.  Neurological: He is alert. Coordination normal.  Skin: Skin is warm and dry. No rash noted.  Psychiatric: He has a normal mood and affect.  Nursing note and vitals reviewed.   ED Course  Procedures (including critical care time) Labs  Review Labs Reviewed  BASIC METABOLIC PANEL  CBC  I-STAT [place], ED    Imaging Review No results found. I have personally reviewed and evaluated these images and lab results as part of my medical decision-making.  ED ECG REPORT   Date: 01/27/2015  Rate: 80  Rhythm: atrial fibrillation  QRS Axis: normal  Intervals: normal  ST/T Wave abnormalities: nonspecific T wave changes  Conduction Disutrbances:right bundle branch block  Narrative Interpretation:   Old EKG Reviewed: unchanged  I have personally reviewed the EKG tracing and agree with the computerized printout as noted. Cardiology Dr .Saunders Revel was consult by me and evaluated patient in the emergency department. He got at the additional history the patient's Coumadin had been recently adjusted. He suggests inpatient stay under hospitalist service. Medications including Imdur may need to be adjusted. Chest x-ray viewed by me Results for orders placed or performed during the hospital encounter of 86/76/72  Basic metabolic panel  Result Value Ref Range   Sodium 138 135 - 145 mmol/L   Potassium 4.9 3.5 - 5.1 mmol/L   Chloride 104 101 - 111 mmol/L   CO2 27 22 - 32 mmol/L   Glucose, Bld 130 (H) 65 - 99 mg/dL   BUN 19 6 - 20 mg/dL   Creatinine, Ser 1.38 (H) 0.61 - 1.24 mg/dL   Calcium 8.5 (L) 8.9 - 10.3 mg/dL    GFR calc non Af Amer 47 (L) >60 mL/min   GFR calc Af Amer 54 (L) >60 mL/min   Anion gap 7 5 - 15  CBC  Result Value Ref Range   WBC 4.6 4.0 - 10.5 K/uL   RBC 4.52 4.22 - 5.81 MIL/uL   Hemoglobin 11.4 (L) 13.0 - 17.0 g/dL   HCT 37.3 (L) 39.0 - 52.0 %   MCV 82.5 78.0 - 100.0 fL   MCH 25.2 (L) 26.0 - 34.0 pg   MCHC 30.6 30.0 - 36.0 g/dL   RDW 16.4 (H) 11.5 - 15.5 %   Platelets 151 150 - 400 K/uL  Protime-INR  Result Value Ref Range   Prothrombin Time 18.5 (H) 11.6 - 15.2 seconds   INR 1.53 (H) 0.00 - 1.49  I-stat troponin, ED  Result Value Ref Range   Troponin i, poc 0.00 0.00 - 0.08 ng/mL   Comment 3          I-stat troponin, ED  Result Value Ref Range   Troponin i, poc 0.00 0.00 - 0.08 ng/mL   Comment 3           Dg Chest 2 View  01/27/2015   CLINICAL DATA:  Chest pain, onset this afternoon. Reported history shortness of breath.  EXAM: CHEST  2 VIEW  COMPARISON:  Radiograph 09/03/2014.  Chest CT 06/01/2012  FINDINGS: Patient is post median sternotomy. The lungs remain hyperinflated. Cardiomediastinal contours are unchanged with prominent central pulmonary arteries, stable from prior exams. Large retrocardiac hiatal hernia, partially obscuring the left lung base evaluation an AP view. No pulmonary edema, airspace consolidation, pleural effusion or pneumothorax. No acute osseous abnormalities are seen.  IMPRESSION: 1.  No acute pulmonary process. 2. Stable chronic findings of hyperinflation, prominent central pulmonary arteries, and retrocardiac hiatal hernia.   Electronically Signed   By: Jeb Levering M.D.   On: 01/27/2015 21:10    MDM  I spoke with Dr. Olevia Bowens who will arrange for 23 hour observation on telemetry Final diagnoses:  None   diagnosis #1chest pain #2 anemia #3renal insufficiency  Orlie Dakin, MD 01/28/15 [phone]

## 2015-01-27 NOTE — ED Notes (Signed)
Pt. reports left chest pain with SOB onset today , denies nausea or vomitting / no diaphoresis .

## 2015-01-28 DIAGNOSIS — R0789 Other chest pain: Secondary | ICD-10-CM | POA: Diagnosis present

## 2015-01-28 DIAGNOSIS — I2510 Atherosclerotic heart disease of native coronary artery without angina pectoris: Secondary | ICD-10-CM | POA: Diagnosis not present

## 2015-01-28 DIAGNOSIS — I5022 Chronic systolic (congestive) heart failure: Secondary | ICD-10-CM | POA: Diagnosis not present

## 2015-01-28 DIAGNOSIS — R079 Chest pain, unspecified: Secondary | ICD-10-CM | POA: Diagnosis not present

## 2015-01-28 DIAGNOSIS — I482 Chronic atrial fibrillation: Secondary | ICD-10-CM

## 2015-01-28 LAB — PROTIME-INR
INR: 1.78 — ABNORMAL HIGH (ref 0.00–1.49)
Prothrombin Time: 20.6 seconds — ABNORMAL HIGH (ref 11.6–15.2)

## 2015-01-28 LAB — I-STAT TROPONIN, ED: Troponin i, poc: 0 ng/mL (ref 0.00–0.08)

## 2015-01-28 LAB — MRSA PCR SCREENING: MRSA by PCR: POSITIVE — AB

## 2015-01-28 LAB — TROPONIN I
TROPONIN I: 0.03 ng/mL (ref <0.031)
TROPONIN I: 0.03 ng/mL (ref <0.031)

## 2015-01-28 LAB — CBC
HCT: 36.5 % — ABNORMAL LOW (ref 39.0–52.0)
HEMOGLOBIN: 11 g/dL — AB (ref 13.0–17.0)
MCH: 24.9 pg — ABNORMAL LOW (ref 26.0–34.0)
MCHC: 30.1 g/dL (ref 30.0–36.0)
MCV: 82.6 fL (ref 78.0–100.0)
PLATELETS: 149 10*3/uL — AB (ref 150–400)
RBC: 4.42 MIL/uL (ref 4.22–5.81)
RDW: 16.4 % — ABNORMAL HIGH (ref 11.5–15.5)
WBC: 4.9 10*3/uL (ref 4.0–10.5)

## 2015-01-28 LAB — CREATININE, SERUM
CREATININE: 1.16 mg/dL (ref 0.61–1.24)
GFR, EST NON AFRICAN AMERICAN: 58 mL/min — AB (ref >60)

## 2015-01-28 LAB — MAGNESIUM: MAGNESIUM: 2.2 mg/dL (ref 1.7–2.4)

## 2015-01-28 MED ORDER — POTASSIUM CHLORIDE CRYS ER 20 MEQ PO TBCR
20.0000 meq | EXTENDED_RELEASE_TABLET | Freq: Two times a day (BID) | ORAL | Status: DC
  Administered 2015-01-28: 20 meq via ORAL
  Filled 2015-01-28: qty 1

## 2015-01-28 MED ORDER — PANTOPRAZOLE SODIUM 40 MG PO TBEC
40.0000 mg | DELAYED_RELEASE_TABLET | Freq: Every day | ORAL | Status: DC
  Administered 2015-01-28: 40 mg via ORAL
  Filled 2015-01-28 (×2): qty 1

## 2015-01-28 MED ORDER — WARFARIN SODIUM 4 MG PO TABS: 2.0000 mg | ORAL_TABLET | ORAL | Status: DC

## 2015-01-28 MED ORDER — WARFARIN - PHYSICIAN DOSING INPATIENT: Freq: Every day | Status: DC

## 2015-01-28 MED ORDER — SODIUM CHLORIDE 0.9 % IJ SOLN: 3.0000 mL | Freq: Two times a day (BID) | INTRAMUSCULAR | Status: DC

## 2015-01-28 MED ORDER — MIRTAZAPINE 15 MG PO TABS: 15.0000 mg | ORAL_TABLET | Freq: Every day | ORAL | Status: DC

## 2015-01-28 MED ORDER — INFLUENZA VAC SPLIT QUAD 0.5 ML IM SUSY
0.5000 mL | PREFILLED_SYRINGE | INTRAMUSCULAR | Status: AC
  Administered 2015-01-28: 0.5 mL via INTRAMUSCULAR
  Filled 2015-01-28: qty 0.5

## 2015-01-28 MED ORDER — WARFARIN SODIUM 3 MG PO TABS: 3.0000 mg | ORAL_TABLET | ORAL | Status: DC

## 2015-01-28 MED ORDER — GABAPENTIN 600 MG PO TABS
600.0000 mg | ORAL_TABLET | Freq: Three times a day (TID) | ORAL | Status: DC
  Administered 2015-01-28: 600 mg via ORAL
  Filled 2015-01-28: qty 1

## 2015-01-28 MED ORDER — IPRATROPIUM BROMIDE 0.02 % IN SOLN: 0.5000 mg | Freq: Four times a day (QID) | RESPIRATORY_TRACT | Status: DC | PRN

## 2015-01-28 MED ORDER — WARFARIN SODIUM 4 MG PO TABS: 2.0000 mg | ORAL_TABLET | Freq: Every day | ORAL | Status: DC

## 2015-01-28 MED ORDER — FUROSEMIDE 20 MG PO TABS: 20.0000 mg | ORAL_TABLET | Freq: Every day | ORAL | Status: DC | PRN

## 2015-01-28 MED ORDER — SODIUM CHLORIDE 0.9 % IV SOLN: 250.0000 mL | INTRAVENOUS | Status: DC | PRN

## 2015-01-28 MED ORDER — SACCHAROMYCES BOULARDII 250 MG PO CAPS
250.0000 mg | ORAL_CAPSULE | Freq: Two times a day (BID) | ORAL | Status: DC
  Administered 2015-01-28: 250 mg via ORAL
  Filled 2015-01-28: qty 1

## 2015-01-28 MED ORDER — SODIUM CHLORIDE 0.9 % IJ SOLN
3.0000 mL | Freq: Two times a day (BID) | INTRAMUSCULAR | Status: DC
  Administered 2015-01-28: 3 mL via INTRAVENOUS

## 2015-01-28 MED ORDER — MORPHINE SULFATE (PF) 2 MG/ML IV SOLN: 2.0000 mg | INTRAVENOUS | Status: DC | PRN

## 2015-01-28 MED ORDER — ENOXAPARIN SODIUM 40 MG/0.4ML ~~LOC~~ SOLN
40.0000 mg | SUBCUTANEOUS | Status: DC
  Administered 2015-01-28: 40 mg via SUBCUTANEOUS
  Filled 2015-01-28: qty 0.4

## 2015-01-28 MED ORDER — DUTASTERIDE 0.5 MG PO CAPS
0.5000 mg | ORAL_CAPSULE | Freq: Every day | ORAL | Status: DC
  Administered 2015-01-28: 0.5 mg via ORAL
  Filled 2015-01-28 (×2): qty 1

## 2015-01-28 MED ORDER — NITROGLYCERIN 0.4 MG SL SUBL: 0.4000 mg | SUBLINGUAL_TABLET | SUBLINGUAL | Status: DC | PRN

## 2015-01-28 MED ORDER — ISOSORBIDE MONONITRATE ER 30 MG PO TB24: 30.0000 mg | ORAL_TABLET | Freq: Every day | ORAL | Status: DC

## 2015-01-28 MED ORDER — LEVALBUTEROL HCL 0.63 MG/3ML IN NEBU: 1.2500 mg | INHALATION_SOLUTION | Freq: Four times a day (QID) | RESPIRATORY_TRACT | Status: DC | PRN

## 2015-01-28 MED ORDER — IPRATROPIUM-ALBUTEROL 0.5-2.5 (3) MG/3ML IN SOLN: 3.0000 mL | Freq: Four times a day (QID) | RESPIRATORY_TRACT | Status: DC | PRN

## 2015-01-28 MED ORDER — SIMVASTATIN 20 MG PO TABS: 20.0000 mg | ORAL_TABLET | Freq: Every day | ORAL | Status: DC

## 2015-01-28 MED ORDER — ONDANSETRON HCL 4 MG/2ML IJ SOLN: 4.0000 mg | Freq: Four times a day (QID) | INTRAMUSCULAR | Status: DC | PRN

## 2015-01-28 MED ORDER — INFLUENZA VAC SPLIT QUAD 0.5 ML IM SUSY: 0.5000 mL | PREFILLED_SYRINGE | INTRAMUSCULAR | Status: DC

## 2015-01-28 MED ORDER — SODIUM CHLORIDE 0.9 % IJ SOLN: 3.0000 mL | INTRAMUSCULAR | Status: DC | PRN

## 2015-01-28 NOTE — Progress Notes (Signed)
All d/c instructions explained and given to pt and wife at the bedside.  Verbalized understanding.  Karie Kirks, Therapist, sports.

## 2015-01-28 NOTE — Progress Notes (Signed)
Pt and wife asking for d/c instructions to go home.  Notified Dr. Clementeen Graham and MD talked with wife.  MD instructed waiting for lab results and will be an hour.  Karie Kirks, Therapist, sports.

## 2015-01-28 NOTE — Consult Note (Signed)
Cardiology Consultation Note  Patient ID: [name], MRN: [id number], DOB/AGE: 1933-05-22 81 y.o. Admit date: 01/27/2015   Date of Consult: 01/28/2015 Primary Physician: [name], MD Primary Cardiologist: Dr. Rayann Heman  Chief Complaint: Chest pain Reason for Consultation: Chest pain  HPI: [name] is an 81 year old man with history of nonobstructive coronary artery disease, atrial fibrillation on warfarin, and COPD, who we have been asked see due to chest pain.  The patient is a poor historian, with much of the information gathered from the patient's wife in the medical record.  At approximately 5:55 PM, the patient developed central chest tightness that lasted for about 2 hours.  He does not recall any obvious precipitating factors.  Notably he was not exerting himself at the time.  The pain resolved spontaneously on its own and was not associated with any other symptoms including shortness of breath, nausea, and diaphoresis.  He reports the maximal intensity was 5/10.  He notes that he may have had similar episodes over the last 1-2 days, though he is unable to elaborate on any specific episodes.  The patient is currently on anti-anginal therapy for his chest pain, with his wife reporting that [name] is typically compliant with his medications, though it is possible that he may miss some doses from time to time.  Upon arrival in the emergency department, the patient was asymptomatic.  His first point-of-care troponin was negative, though was noted that his INR was subtherapeutic.  Of note, the patient's warfarin was adjusted yesterday by his Coumadin clinic for a subtherapeutic INR.  He has not noticed any significant bleeding .  He also denies palpitations, lightheadedness, and syncope.    On further questioning, the patient's wife is concerned that [name] has been having decreased appetite and less fluid intake over the last several weeks.  Past Medical History  Diagnosis  Date  . History of upper gastrointestinal hemorrhage     dr. Maurene Capes, GI  . Cerebrovascular accident 67  . Gastric polyp   . Dysphagia, unspecified(787.20)   . Coronary artery disease     a. Nonobstructive by cath 2009.  [name] Permanent atrial fibrillation     INR followed at [facility].  [name] Hypertension   . Esophagitis   . GERD (gastroesophageal reflux disease)   . Erosive gastritis     H/o GIB felt secondary to this  . Gastric ulcer   . Hiatal hernia   . Internal hemorrhoids   . Hearing impairment   . Vision problems   . Chronic bronchitis     a. h/o resp failure in setting of bronchitis vs PNA 05/2012.  [name] Hyperlipidemia   . ASD (atrial septal defect)     a. s/p repair 1983.  [name] BPH (benign prostatic hyperplasia)   . COPD (chronic obstructive pulmonary disease)   . Anemia   . Depression   . LV dysfunction     a. EF 45-50% by echo 05/2012.  [name] Chronic diarrhea       Most Recent Cardiac Studies: Left heart catheterization (09/06/14): Mild diffuse CAD.  Normal LVEDP (12 mmHg).  Echocardiogram (05/05/14): Mild LVH with LVEF of 55-60%.  Aortic sclerosis without significant stenosis.  Severely dilated left ventricle.  Mildly dilated right ventricle with reduced contraction.  Severely dilated right atrium.  Mild TR.  Mild pulmonary hypertension.     Surgical History:  Past Surgical History  Procedure Laterality Date  . Cholecystectomy  1990s  . Asd repair  [id number]  .  Inguinal hernia repair  1963  . Partial small bowel resection  2005    ischemic?  [name] Enteroscopy  05/13/2012    Procedure: ENTEROSCOPY;  Surgeon: Inda Castle, MD;  Location: [facility] ENDOSCOPY;  Service: Endoscopy;  Laterality: N/A;  . Cardioversion      possibly 3 times, dr. brody/cooper  . Left heart catheterization with coronary angiogram N/A 09/06/2014    Procedure: LEFT HEART CATHETERIZATION WITH CORONARY ANGIOGRAM;  Surgeon: Jettie Booze, MD;  Location: [facility] CATH LAB;  Service: Cardiovascular;  Laterality: N/A;      Home Meds: Prior to Admission medications   Medication Sig Start Date [name] Date Taking? Authorizing Provider  dutasteride (AVODART) 0.5 MG capsule TAKE (1) CAPSULE DAILY 08/29/14  Yes [name], MD  FLORASTOR 250 MG capsule TAKE  (1)  CAPSULE  TWICE DAILY.   Yes [name], MD  furosemide (LASIX) 20 MG tablet TAKE ONE TABLET TWICE A DAY AS NEEDED FOR EDEMA Patient taking differently: Take 20 mg by mouth daily as needed for edema.  12/26/14  Yes [name], MD  gabapentin (NEURONTIN) 600 MG tablet TAKE (1) TABLET BY MOUTH TWICE DAILY. Patient taking differently: Take 600 mg by mouth 3 (three) times daily.  08/20/14  Yes Rogelia Mire, NP  ipratropium (ATROVENT) 0.02 % nebulizer solution NEBULIZE 1 VAIL 4 TIMES A DAY AS DIRECTED Patient taking differently: NEBULIZE 1 VAIL daily. 11/14/14  Yes Tanda Rockers, MD  isosorbide mononitrate (IMDUR) 30 MG 24 hr tablet Take 1 tablet (30 mg total) by mouth daily. Patient taking differently: Take 30 mg by mouth at bedtime.  08/20/14  Yes Rogelia Mire, NP  levalbuterol (XOPENEX) 0.63 MG/3ML nebulizer solution NEBULIZE 1 VIAL (WITH IPRATROPIUM) TWICE A DAY AS NEEDED. MAY USE EXTR A EVERY 4 HOURS IF NEEDED Patient taking differently: NEBULIZE 1 VIAL (WITH IPRATROPIUM) daily 06/20/14  Yes [name], MD  mirtazapine (REMERON) 15 MG tablet Take 1 tablet (15 mg total) by mouth at bedtime. 08/20/14  Yes Rogelia Mire, NP  mupirocin ointment (BACTROBAN) 2 % Apply to affected rash twice daily Patient taking differently: Apply 1 application topically 2 (two) times daily as needed (for rash).  10/06/14  Yes [name], MD  nitroGLYCERIN (NITROSTAT) 0.4 MG SL tablet Place 1 tablet (0.4 mg total) under the tongue every 5 (five) minutes x 3 doses as needed for chest pain. 08/20/14  Yes Rogelia Mire, NP  pantoprazole (PROTONIX) 40 MG tablet TAKE (1) TABLET TWICE A DAY BEFORE MEALS. Patient taking differently: TAKE (1)  TABLET TWICE A DAY BEFORE MEALS AS NEEDED 04/25/14  Yes [name], MD  potassium chloride (K-DUR,KLOR-CON) 10 MEQ tablet TAKE (2) TABLETS TWICE DAILY. 11/28/14  Yes Marletta Lor, MD  simvastatin (ZOCOR) 20 MG tablet TAKE ONE TABLET AT BEDTIME 02/10/14  Yes [name], MD  warfarin (COUMADIN) 1 MG tablet TAKES 2MG  ON MON, WED AND FRI TAKES 3MG  ALL OTHER DAYS Patient taking differently: Take 2-3 mg by mouth daily at 6 PM. 6 mg on 01-26-15, 3 mg 01-27-15 then going back to 2 mg on Monday and Friday and 3 mg all other days. 12/06/14  Yes [name], MD  albuterol (PROVENTIL HFA;VENTOLIN HFA) 108 (90 BASE) MCG/ACT inhaler Inhale 2 puffs into the lungs every 6 (six) hours as needed for wheezing or shortness of breath. 04/27/14   [name], MD  colchicine 0.6 MG tablet 2 po now and may  repeat in 1 po in 1 hour if needed- no more then 3 in 24 hours Patient not taking: Reported on 01/27/2015 09/27/14   Mary-Margaret Hassell Done, FNP    Inpatient Medications:       Allergies:  Allergies  Allergen Reactions  . Penicillins Hives    Social History   Social History  . Marital Status: Married    Spouse Name: N/A  . Number of Children: N/A  . Years of Education: N/A   Occupational History  . Not on file.   Social History Main Topics  . Smoking status: Former Smoker -- 0.00 packs/day for 0 years    Types: Cigarettes    Quit date: 06/13/1983  . Smokeless tobacco: Never Used  . Alcohol Use: No  . Drug Use: No  . Sexual Activity: Not on file   Other Topics Concern  . Not on file   Social History Narrative   Lives in [place] with wife.  Several falls recently, 4 in the last month.        Jadiel [name]  [phone]                 Family History  Problem Relation Age of Onset  . Heart disease Mother   . Heart disease Father   . Prostate cancer Brother      Review of Systems: A 12 system review of systems was performed and was negative, except as noted  in the history of present illness.  Labs: No results for input(s): CKTOTAL, CKMB, TROPONINI in the last 72 hours. Lab Results  Component Value Date   WBC 4.6 01/27/2015   HGB 11.4* 01/27/2015   HCT 37.3* 01/27/2015   MCV 82.5 01/27/2015   PLT 151 01/27/2015    Recent Labs Lab 01/27/15 1959  NA 138  K 4.9  CL 104  CO2 27  BUN 19  CREATININE 1.38*  CALCIUM 8.5*  GLUCOSE 130*   Lab Results  Component Value Date   CHOL 121 09/04/2014   HDL 42 09/04/2014   LDLCALC 52 09/04/2014   TRIG 134 09/04/2014   No results found for: DDIMER  Radiology/Studies:  Dg Chest 2 View  01/27/2015   CLINICAL DATA:  Chest pain, onset this afternoon. Reported history shortness of breath.  EXAM: CHEST  2 VIEW  COMPARISON:  Radiograph 09/03/2014.  Chest CT 06/01/2012  FINDINGS: Patient is post median sternotomy. The lungs remain hyperinflated. Cardiomediastinal contours are unchanged with prominent central pulmonary arteries, stable from prior exams. Large retrocardiac hiatal hernia, partially obscuring the left lung base evaluation an AP view. No pulmonary edema, airspace consolidation, pleural effusion or pneumothorax. No acute osseous abnormalities are seen.  IMPRESSION: 1.  No acute pulmonary process. 2. Stable chronic findings of hyperinflation, prominent central pulmonary arteries, and retrocardiac hiatal hernia.   Electronically Signed   By: Jeb Levering M.D.   On: 01/27/2015 21:10    Wt Readings from Last 3 Encounters:  01/28/15 76.794 kg (169 lb 4.8 oz)  10/06/14 75.297 kg (166 lb)  09/27/14 76.658 kg (169 lb)    EKG: Atrial fibrillation with right bundle branch block  Physical Exam: Blood pressure 179/84, pulse 68, temperature 98 F (36.7 C), temperature source Oral, resp. rate 20, height 5\' 11"  (1.803 m), weight 76.794 kg (169 lb 4.8 oz), SpO2 95 %. General: Elderly man lying comfortably on stretcher.  His wife is at the bedside. Head: Normocephalic, atraumatic, sclera  non-icteric, no xanthomas, nares are without discharge.  Neck: Negative for  carotid bruits. JVD not elevated. Lungs: Diminished air movement bilaterally with bilateral expiratory wheezes.  No crackles. Heart: Irregularly irregular with split S2.  No murmurs. Abdomen: Soft, non-tender, non-distended with normoactive bowel sounds. No hepatomegaly. No rebound/guarding. No obvious abdominal masses. Msk:  Strength and tone appear normal for age. Extremities: No clubbing or cyanosis. No edema.  Distal pedal pulses are 2+ and equal bilaterally. Neuro: Patient is quite forgetful but alert and oriented to person and place.  No focal neurologic deficits.    Assessment and Plan:  [name] is a 81 year old man with history of nonobstructive coronary artery disease, paroxysmal atrial fibrillation, and COPD, who we have been asked to see due to a reported episode of chest pain yesterday evening.  Unfortunately, the patient is somewhat unclear about the events surrounding his episode of chest pain.  Given his nonobstructive coronary artery disease on prior catheterization, I do not believe that his symptoms reflect an acute coronary syndrome.  I would favor a GI etiology such as GERD or esophagitis related to his hiatal hernia. - Continue current cardiac medications, though would consider increasing isosorbide mononitrate to 60 mg daily (to be administered in the morning) if patient has recurrent symptoms. - No need for further cardiac evaluation as an inpatient.  Patient should follow-up with Dr. Rayann Heman as an outpatient.  Failure to thrive: Patient's wife reports that [name] has demonstrated decreased appetite and fluid intake.  He also seems somewhat forgetful and confused on exam today.  Will defer further evaluation of potential organic cause of these findings to the hospitalist service.   Angelina Sheriff MD  01/28/2015, 2:32 AM

## 2015-01-28 NOTE — Progress Notes (Signed)
At 1211 am notified by Dedra from central monitor, instructing that strip looks like pt has spikes /pacemaker.  Instructed that pt does not and also verify with pt and his wife.    Notified Dr. Clementeen Graham to review strip.  Karie Kirks, Therapist, sports.

## 2015-01-28 NOTE — Discharge Summary (Addendum)
Physician Discharge Summary  [name] HGD:[id number] DOB: 01/12/1933 DOA: 01/27/2015  PCP: [name], MD  Admit date: 01/27/2015 Discharge date: 01/28/2015  Time spent: 25 minutes  Recommendations for Outpatient Follow-up:  1. Discharge home with outpatient follow-up with PCP and cardiology  Discharge Diagnoses:  Principal Problem:   Atypical chest pain   Active Problems:   Hyperlipidemia   Essential hypertension   FIBRILLATION, ATRIAL   Hyperglycemia   Discharge Condition: Fair  Diet recommendation: Heart healthy  Filed Weights   01/27/15 1947 01/28/15 0152  Weight: 79.379 kg (175 lb) 76.794 kg (169 lb 4.8 oz)    History of present illness:  81 year old male with history of nonobstructive coronary artery disease (with stress test followed by cardiac cath in April 2016), A. fib on Coumadin, GERD, COPD, peaked 8 history of CVA presented to the ED with sudden onset of chest tightness lasting for almost 2 hours on the evening of admission. Denies any aggravating or alleviating factors. He thinks he may have lifted some heavy objects. Denies any chest trauma. Denies any radiation of the pain. Pain subsided on its own. Denies associated shortness of breath, palpitations, diaphoresis, nausea or vomiting. Reports being compliant with his medications but thinks he may have missed some doses recently. In the ED his vitals were stable. Point-of-care troponin was negative. His INR was subtherapeutic. Patient admitted on observation for rule out ACS. Cardiology consulted.   Hospital Course:  Chest pain  Appears to be atypical likely musculoskeletal versus GERD Patient is a poor historian. Serial troponin have been negative. No further symptoms and patient stable on telemetry. Patient has a nonobstructive coronary artery disease on recent cardiac cath 4 months back which is reassuring. Continue home dose Imdur, when necessary nitroglycerin, statin and Lasix. Follow with  PCP and cardiology as outpatient.   Atrial fibrillation INR subtherapeutic but appears that he was just followed up Coumadin clinic and dose adjusted.  COPD Continue home inhaler and nebs.  GERD Continue PPI  History of gout Continue colchicine  Patient stable to be discharged home with outpatient follow-up.  Procedures:  None  Consultations: Cardiology     Discharge Exam: Filed Vitals:   01/28/15 0914  BP: 141/87  Pulse: 78  Temp: 97.8 F (36.6 C)  Resp: 18    General: Elderly male in no acute distress HEENT: Moist oral mucosa, no JVD Chest: Clear to auscultation bilaterally CVS: S1 and S2 irregular, no murmurs GI: Soft, nondistended, nontender, bowel sounds present.  Musculoskeletal: Warm, no edema CNS: Alert and oriented   Discharge Instructions    Current Discharge Medication List    CONTINUE these medications which have NOT CHANGED   Details  dutasteride (AVODART) 0.5 MG capsule TAKE (1) CAPSULE DAILY Qty: 30 capsule, Refills: 5    FLORASTOR 250 MG capsule TAKE  (1)  CAPSULE  TWICE DAILY. Qty: 50 capsule, Refills: 3    furosemide (LASIX) 20 MG tablet TAKE ONE TABLET TWICE A DAY AS NEEDED FOR EDEMA Qty: 180 tablet, Refills: 1    gabapentin (NEURONTIN) 600 MG tablet TAKE (1) TABLET BY MOUTH TWICE DAILY. Qty: 90 tablet, Refills: 2    ipratropium (ATROVENT) 0.02 % nebulizer solution NEBULIZE 1 VAIL 4 TIMES A DAY AS DIRECTED Qty: 312.5 mL, Refills: 0    isosorbide mononitrate (IMDUR) 30 MG 24 hr tablet Take 1 tablet (30 mg total) by mouth daily. Qty: 30 tablet, Refills: 6    levalbuterol (XOPENEX) 0.63 MG/3ML nebulizer solution NEBULIZE 1 VIAL (WITH IPRATROPIUM)  TWICE A DAY AS NEEDED. MAY USE EXTR A EVERY 4 HOURS IF NEEDED Qty: 216 mL, Refills: 5    mirtazapine (REMERON) 15 MG tablet Take 1 tablet (15 mg total) by mouth at bedtime. Qty: 90 tablet, Refills: 2    mupirocin ointment (BACTROBAN) 2 % Apply to affected rash twice daily Qty: 22  g, Refills: 0    nitroGLYCERIN (NITROSTAT) 0.4 MG SL tablet Place 1 tablet (0.4 mg total) under the tongue every 5 (five) minutes x 3 doses as needed for chest pain. Qty: 25 tablet, Refills: 3    pantoprazole (PROTONIX) 40 MG tablet TAKE (1) TABLET TWICE A DAY BEFORE MEALS. Qty: 60 tablet, Refills: 2    potassium chloride (K-DUR,KLOR-CON) 10 MEQ tablet TAKE (2) TABLETS TWICE DAILY. Qty: 120 tablet, Refills: 1    simvastatin (ZOCOR) 20 MG tablet TAKE ONE TABLET AT BEDTIME Qty: 30 tablet, Refills: 11    warfarin (COUMADIN) 1 MG tablet TAKES 2MG  ON MON, WED AND FRI TAKES 3MG  ALL OTHER DAYS Qty: 80 tablet, Refills: 3    albuterol (PROVENTIL HFA;VENTOLIN HFA) 108 (90 BASE) MCG/ACT inhaler Inhale 2 puffs into the lungs every 6 (six) hours as needed for wheezing or shortness of breath. Qty: 1 Inhaler, Refills: 2      STOP taking these medications     colchicine 0.6 MG tablet        Allergies  Allergen Reactions  . Penicillins Hives   Follow-up Information    Follow up with Thompson Grayer, MD.   Specialty:  Cardiology   Why:  Office will contact you in 2 business days to arrange 2-3 month followup with Dr. Rayann Heman. Please give [name] a call if you do not hear from our scheduler.   Contact information:   [address] [place]  [address] [phone]       Follow up with [name], MD In 2 weeks.   Specialty:  Family Medicine   Contact information:   [place] [address] [phone]        The results of significant diagnostics from this hospitalization (including imaging, microbiology, ancillary and laboratory) are listed below for reference.    Significant Diagnostic Studies: Dg Chest 2 View  01/27/2015   CLINICAL DATA:  Chest pain, onset this afternoon. Reported history shortness of breath.  EXAM: CHEST  2 VIEW  COMPARISON:  Radiograph 09/03/2014.  Chest CT 06/01/2012  FINDINGS: Patient is post median sternotomy. The lungs  remain hyperinflated. Cardiomediastinal contours are unchanged with prominent central pulmonary arteries, stable from prior exams. Large retrocardiac hiatal hernia, partially obscuring the left lung base evaluation an AP view. No pulmonary edema, airspace consolidation, pleural effusion or pneumothorax. No acute osseous abnormalities are seen.  IMPRESSION: 1.  No acute pulmonary process. 2. Stable chronic findings of hyperinflation, prominent central pulmonary arteries, and retrocardiac hiatal hernia.   Electronically Signed   By: Jeb Levering M.D.   On: 01/27/2015 21:10    Microbiology: Recent Results (from the past 240 hour(s))  MRSA PCR Screening     Status: Abnormal   Collection Time: 01/28/15  1:58 AM  Result Value Ref Range Status   MRSA by PCR POSITIVE (A) NEGATIVE Final    Comment:        The GeneXpert MRSA Assay (FDA approved for NASAL specimens only), is one component of a comprehensive MRSA colonization surveillance program. It is not intended to diagnose MRSA infection nor to guide or monitor treatment for MRSA  infections. RESULT CALLED TO, READ BACK BY AND VERIFIED WITH: D HART@0421  01/28/15 MKELLY      Labs: Basic Metabolic Panel:  Recent Labs Lab 01/27/15 1959 01/28/15 0445  NA 138  --   K 4.9  --   CL 104  --   CO2 27  --   GLUCOSE 130*  --   BUN 19  --   CREATININE 1.38* 1.16  CALCIUM 8.5*  --   MG  --  2.2   Liver Function Tests: No results for input(s): AST, ALT, ALKPHOS, BILITOT, PROT, ALBUMIN in the last 168 hours. No results for input(s): LIPASE, AMYLASE in the last 168 hours. No results for input(s): AMMONIA in the last 168 hours. CBC:  Recent Labs Lab 01/27/15 2045 01/28/15 0445  WBC 4.6 4.9  HGB 11.4* 11.0*  HCT 37.3* 36.5*  MCV 82.5 82.6  PLT 151 149*   Cardiac Enzymes:  Recent Labs Lab 01/28/15 0445 01/28/15 1200  TROPONINI 0.03 0.03   BNP: BNP (last 3 results)  Recent Labs  09/03/14 1821  BNP 55.6    ProBNP  (last 3 results) No results for input(s): PROBNP in the last 8760 hours.  CBG: No results for input(s): GLUCAP in the last 168 hours.     SignedLouellen Molder  [facility] 01/28/2015, 1:13 PM

## 2015-01-28 NOTE — H&P (Signed)
[facility] History and Physical  [name] PPJ:[id number] DOB: 1932-10-31 DOA: 01/27/2015  Referring physician: Orlie Dakin, MD PCP: [name], MD   Chief Complaint: Chest Pain.  HPI: [name] is a 81 y.o. male with a past medical history of CAD, atrial fibrillation, mild systolic CHF, hypertension, CVA, hyperlipidemia, BPH, COPD, depression who comes to the ER due to left-sided pressure-like, non- radiated chest pain since early afternoon associated with dyspnea, but denies dizziness, diaphoresis, palpitations, PND, orthopnea or pitting edema of the lower extremities. He denies any relieving or exacerbating factors. The pain was self-limited, lasted about 2 hours and resolve on its own. He is currently chest pain-free and denies any other complaints.   Review of Systems:  Constitutional:  No weight loss, night sweats, Fevers, chills, fatigue.  HEENT:  No headaches, Difficulty swallowing,Tooth/dental problems,Sore throat,  No sneezing, itching, ear ache, nasal congestion, post nasal drip,  Cardio-vascular:  No chest pain, Orthopnea, PND, swelling in lower extremities, anasarca, dizziness, palpitations  GI:  No heartburn, indigestion, abdominal pain, nausea, vomiting, diarrhea, change in bowel habits, loss of appetite  Resp:  No shortness of breath with exertion or at rest. No excess mucus, no productive cough, No non-productive cough, No coughing up of blood.No change in color of mucus.No wheezing.No chest wall deformity  Skin:  no rash or lesions.  GU:  no dysuria, change in color of urine, no urgency or frequency. No flank pain.  Musculoskeletal:  No joint pain or swelling. No decreased range of motion. No back pain.  Psych:  No change in mood or affect. No depression or anxiety. No memory loss.   Past Medical History  Diagnosis Date  . History of upper gastrointestinal hemorrhage     dr. Maurene Capes, GI  . Cerebrovascular accident 67  . Gastric  polyp   . Dysphagia, unspecified(787.20)   . Coronary artery disease     a. Nonobstructive by cath 2009.  [name] Permanent atrial fibrillation     INR followed at [facility].  [name] Hypertension   . Esophagitis   . GERD (gastroesophageal reflux disease)   . Erosive gastritis     H/o GIB felt secondary to this  . Gastric ulcer   . Hiatal hernia   . Internal hemorrhoids   . Hearing impairment   . Vision problems   . Chronic bronchitis     a. h/o resp failure in setting of bronchitis vs PNA 05/2012.  [name] Hyperlipidemia   . ASD (atrial septal defect)     a. s/p repair 1983.  [name] BPH (benign prostatic hyperplasia)   . COPD (chronic obstructive pulmonary disease)   . Anemia   . Depression   . LV dysfunction     a. EF 45-50% by echo 05/2012.  [name] Chronic diarrhea    Past Surgical History  Procedure Laterality Date  . Cholecystectomy  1990s  . Asd repair  [id number]  . Inguinal hernia repair  1963  . Partial small bowel resection  2005    ischemic?  [name] Enteroscopy  05/13/2012    Procedure: ENTEROSCOPY;  Surgeon: Inda Castle, MD;  Location: [facility] ENDOSCOPY;  Service: Endoscopy;  Laterality: N/A;  . Cardioversion      possibly 3 times, dr. brody/cooper  . Left heart catheterization with coronary angiogram N/A 09/06/2014    Procedure: LEFT HEART CATHETERIZATION WITH CORONARY ANGIOGRAM;  Surgeon: Jettie Booze, MD;  Location: [facility] CATH LAB;  Service: Cardiovascular;  Laterality: N/A;   Social History:  reports that he quit smoking about 31 years ago. His smoking use included Cigarettes. He smoked 0.00 packs per day for 0 years. He has never used smokeless tobacco. He reports that he does not drink alcohol or use illicit drugs.  Allergies  Allergen Reactions  . Penicillins Hives    Family History  Problem Relation Age of Onset  . Heart disease Mother   . Heart disease Father   . Prostate cancer Brother     Prior to Admission medications   Medication Sig Start Date End Date Taking? Authorizing  Provider  dutasteride (AVODART) 0.5 MG capsule TAKE (1) CAPSULE DAILY 08/29/14  Yes [name], MD  FLORASTOR 250 MG capsule TAKE  (1)  CAPSULE  TWICE DAILY.   Yes [name], MD  furosemide (LASIX) 20 MG tablet TAKE ONE TABLET TWICE A DAY AS NEEDED FOR EDEMA Patient taking differently: Take 20 mg by mouth daily as needed for edema.  12/26/14  Yes [name], MD  gabapentin (NEURONTIN) 600 MG tablet TAKE (1) TABLET BY MOUTH TWICE DAILY. Patient taking differently: Take 600 mg by mouth 3 (three) times daily.  08/20/14  Yes Rogelia Mire, NP  ipratropium (ATROVENT) 0.02 % nebulizer solution NEBULIZE 1 VAIL 4 TIMES A DAY AS DIRECTED Patient taking differently: NEBULIZE 1 VAIL daily. 11/14/14  Yes Tanda Rockers, MD  isosorbide mononitrate (IMDUR) 30 MG 24 hr tablet Take 1 tablet (30 mg total) by mouth daily. Patient taking differently: Take 30 mg by mouth at bedtime.  08/20/14  Yes Rogelia Mire, NP  levalbuterol (XOPENEX) 0.63 MG/3ML nebulizer solution NEBULIZE 1 VIAL (WITH IPRATROPIUM) TWICE A DAY AS NEEDED. MAY USE EXTR A EVERY 4 HOURS IF NEEDED Patient taking differently: NEBULIZE 1 VIAL (WITH IPRATROPIUM) daily 06/20/14  Yes [name], MD  mirtazapine (REMERON) 15 MG tablet Take 1 tablet (15 mg total) by mouth at bedtime. 08/20/14  Yes Rogelia Mire, NP  mupirocin ointment (BACTROBAN) 2 % Apply to affected rash twice daily Patient taking differently: Apply 1 application topically 2 (two) times daily as needed (for rash).  10/06/14  Yes [name], MD  nitroGLYCERIN (NITROSTAT) 0.4 MG SL tablet Place 1 tablet (0.4 mg total) under the tongue every 5 (five) minutes x 3 doses as needed for chest pain. 08/20/14  Yes Rogelia Mire, NP  pantoprazole (PROTONIX) 40 MG tablet TAKE (1) TABLET TWICE A DAY BEFORE MEALS. Patient taking differently: TAKE (1) TABLET TWICE A DAY BEFORE MEALS AS NEEDED 04/25/14  Yes [name], MD  potassium chloride  (K-DUR,KLOR-CON) 10 MEQ tablet TAKE (2) TABLETS TWICE DAILY. 11/28/14  Yes Marletta Lor, MD  simvastatin (ZOCOR) 20 MG tablet TAKE ONE TABLET AT BEDTIME 02/10/14  Yes [name], MD  warfarin (COUMADIN) 1 MG tablet TAKES 2MG  ON MON, WED AND FRI TAKES 3MG  ALL OTHER DAYS Patient taking differently: Take 2-3 mg by mouth daily at 6 PM. 6 mg on 01-26-15, 3 mg 01-27-15 then going back to 2 mg on Monday and Friday and 3 mg all other days. 12/06/14  Yes [name], MD  albuterol (PROVENTIL HFA;VENTOLIN HFA) 108 (90 BASE) MCG/ACT inhaler Inhale 2 puffs into the lungs every 6 (six) hours as needed for wheezing or shortness of breath. 04/27/14   [name], MD  colchicine 0.6 MG tablet 2 po now and may repeat in 1 po in 1 hour if needed- no more then 3 in 24 hours Patient  not taking: Reported on 01/27/2015 09/27/14   Mary-Margaret Hassell Done, FNP   Physical Exam: Filed Vitals:   01/27/15 2315 01/27/15 2345 01/28/15 0115 01/28/15 0152  BP: 142/70 160/66 165/70 179/84  Pulse: 72  74 68  Temp:    98 F (36.7 C)  TempSrc:    Oral  Resp: 21 22 21 20   Height:    5\' 11"  (1.803 m)  Weight:    76.794 kg (169 lb 4.8 oz)  SpO2: 93% 94% 95% 95%    Wt Readings from Last 3 Encounters:  01/28/15 76.794 kg (169 lb 4.8 oz)  10/06/14 75.297 kg (166 lb)  09/27/14 76.658 kg (169 lb)    General:  Appears calm and comfortable Eyes: PERRL, normal lids, irises & conjunctiva ENT: grossly normal hearing, lips & tongue Neck: no LAD, masses or thyromegaly Cardiovascular: RRR, no m/r/g. No LE edema. Telemetry: SR, no arrhythmias  Respiratory: CTA bilaterally, no w/r/r. Normal respiratory effort. Abdomen: soft, ntnd Skin: no rash or induration seen on limited exam Musculoskeletal: grossly normal tone BUE/BLE Psychiatric: grossly normal mood and affect, speech fluent and appropriate Neurologic: grossly non-focal.          Labs on Admission:  Basic Metabolic Panel:  Recent Labs Lab  01/27/15 1959  NA 138  K 4.9  CL 104  CO2 27  GLUCOSE 130*  BUN 19  CREATININE 1.38*  CALCIUM 8.5*   Liver Function Tests: No results for input(s): AST, ALT, ALKPHOS, BILITOT, PROT, ALBUMIN in the last 168 hours. No results for input(s): LIPASE, AMYLASE in the last 168 hours. No results for input(s): AMMONIA in the last 168 hours. CBC:  Recent Labs Lab 01/27/15 2045  WBC 4.6  HGB 11.4*  HCT 37.3*  MCV 82.5  PLT 151   Cardiac Enzymes: No results for input(s): CKTOTAL, CKMB, CKMBINDEX, TROPONINI in the last 168 hours.  BNP (last 3 results)  Recent Labs  09/03/14 1821  BNP 55.6    Radiological Exams on Admission: Dg Chest 2 View  01/27/2015   CLINICAL DATA:  Chest pain, onset this afternoon. Reported history shortness of breath.  EXAM: CHEST  2 VIEW  COMPARISON:  Radiograph 09/03/2014.  Chest CT 06/01/2012  FINDINGS: Patient is post median sternotomy. The lungs remain hyperinflated. Cardiomediastinal contours are unchanged with prominent central pulmonary arteries, stable from prior exams. Large retrocardiac hiatal hernia, partially obscuring the left lung base evaluation an AP view. No pulmonary edema, airspace consolidation, pleural effusion or pneumothorax. No acute osseous abnormalities are seen.  IMPRESSION: 1.  No acute pulmonary process. 2. Stable chronic findings of hyperinflation, prominent central pulmonary arteries, and retrocardiac hiatal hernia.   Electronically Signed   By: Jeb Levering M.D.   On: 01/27/2015 21:10    EKG: Independently reviewed.   Assessment/Plan Principal Problem:   Chest pain Active Problems:   Hyperlipidemia   Essential hypertension   FIBRILLATION, ATRIAL   Hyperglycemia  Admit for telemetry monitoring, serial troponin levels. Continue regular home medications. Follow-up with cardiology.  Dr. Harrell Gave and from the cardiology service was consulted by the emergency department.  Code Status: Full code. DVT  Prophylaxis: Family Communication:  Disposition Plan:   Time spent: over 75 minutes.  Reubin Milan [facility] Pager [phone].

## 2015-01-30 ENCOUNTER — Telehealth: Payer: Self-pay | Admitting: *Deleted

## 2015-01-30 NOTE — Telephone Encounter (Signed)
Left message on machine for patient to return our call.  Transitional care management. 

## 2015-01-31 NOTE — Telephone Encounter (Signed)
Transition Care Management Follow-up Telephone Call  How have you been since you were released from the hospital? Doing okay   Do you understand why you were in the hospital? yes   Do you understand the discharge instrcutions? yes  Items Reviewed:  Medications reviewed: yes  Allergies reviewed: yes  Dietary changes reviewed: yes  Referrals reviewed: yes   Functional Questionnaire:   Activities of Daily Living (ADLs):   He states they are independent in the following: ambulation, bathing and hygiene, feeding, continence, grooming, toileting and dressing States they require assistance with the following: none   Any transportation issues/concerns?: no   Any patient concerns? no   Confirmed importance and date/time of follow-up visits scheduled: yes   Confirmed with patient if condition begins to worsen call PCP or go to the ER.  Patient was given the Call-a-Nurse line [phone]: yes Patient was discharged 01/27/15 Patient was discharged to home Patient has an appointment with Dr Elease Hashimoto 02/02/15

## 2015-02-02 ENCOUNTER — Other Ambulatory Visit: Payer: Self-pay | Admitting: Nurse Practitioner

## 2015-02-02 ENCOUNTER — Encounter: Payer: Self-pay | Admitting: Family Medicine

## 2015-02-02 ENCOUNTER — Ambulatory Visit (INDEPENDENT_AMBULATORY_CARE_PROVIDER_SITE_OTHER): Payer: Medicare Other | Admitting: Family Medicine

## 2015-02-02 VITALS — BP 130/70 | HR 60 | Temp 98.2°F | Wt 175.0 lb

## 2015-02-02 DIAGNOSIS — R0789 Other chest pain: Secondary | ICD-10-CM | POA: Diagnosis not present

## 2015-02-02 DIAGNOSIS — K219 Gastro-esophageal reflux disease without esophagitis: Secondary | ICD-10-CM | POA: Diagnosis not present

## 2015-02-02 DIAGNOSIS — J449 Chronic obstructive pulmonary disease, unspecified: Secondary | ICD-10-CM | POA: Diagnosis not present

## 2015-02-02 NOTE — Progress Notes (Signed)
Subjective:    Patient ID: [name], male    DOB: 12-Jun-1932, 81 y.o.   MRN: [id number]  HPI Patient seen for hospital follow-up. He is admitted on 8/26 with chest pain and discharged the next day. Ruled out for MI. He has history of nonobstructive coronary disease by cardiac cath in April 2016. He has history of atrial fibrillation on Coumadin and long history of GERD and COPD. Past history of CVA. He had sudden onset of some chest tightness left side of chest lasting about 2 hours on the evening of admission. No acute EKG changes. He was seen in consultation by cardiology. No further workup recommended.  He was continued on his usual medications. Patient somewhat of a poor historian. He's not had any further chest pain. Wife states that he takes several his medications without much fluid and she thinks some of this may be esophageal irritation from taking tablets without much fluid. He denies a dysphagia. He has long history of GERD and takes Protonix 40 mg twice a day. He denies any chest pain at this time. No dysphagia to foods. No dyspnea other than related to his chronic COPD. He uses nebulizer with Atrovent and Xopenex but inconsistently. Had flu vaccine already   His weight is up about 9 pounds compared with previous weight here last spring but has not had any recent increased peripheral edema Past Medical History  Diagnosis Date  . History of upper gastrointestinal hemorrhage     dr. Maurene Capes, GI  . Cerebrovascular accident 57  . Gastric polyp   . Dysphagia, unspecified(787.20)   . Coronary artery disease     a. Nonobstructive by cath 2009.  [name] Permanent atrial fibrillation     INR followed at [facility].  [name] Hypertension   . Esophagitis   . GERD (gastroesophageal reflux disease)   . Erosive gastritis     H/o GIB felt secondary to this  . Gastric ulcer   . Hiatal hernia   . Internal hemorrhoids   . Hearing impairment   . Vision problems   . Chronic bronchitis     a. h/o resp  failure in setting of bronchitis vs PNA 05/2012.  [name] Hyperlipidemia   . ASD (atrial septal defect)     a. s/p repair 1983.  [name] BPH (benign prostatic hyperplasia)   . COPD (chronic obstructive pulmonary disease)   . Anemia   . Depression   . LV dysfunction     a. EF 45-50% by echo 05/2012.  [name] Chronic diarrhea    Past Surgical History  Procedure Laterality Date  . Cholecystectomy  1990s  . Asd repair  [id number]  . Inguinal hernia repair  1963  . Partial small bowel resection  2005    ischemic?  [name] Enteroscopy  05/13/2012    Procedure: ENTEROSCOPY;  Surgeon: Inda Castle, MD;  Location: [facility] ENDOSCOPY;  Service: Endoscopy;  Laterality: N/A;  . Cardioversion      possibly 3 times, dr. brody/cooper  . Left heart catheterization with coronary angiogram N/A 09/06/2014    Procedure: LEFT HEART CATHETERIZATION WITH CORONARY ANGIOGRAM;  Surgeon: Jettie Booze, MD;  Location: [facility] CATH LAB;  Service: Cardiovascular;  Laterality: N/A;    reports that he quit smoking about 31 years ago. His smoking use included Cigarettes. He smoked 0.00 packs per day for 0 years. He has never used smokeless tobacco. He reports that he does not drink alcohol or use illicit drugs. family history includes Heart disease in  his father and mother; Prostate cancer in his brother. Allergies  Allergen Reactions  . Penicillins Hives      Review of Systems  Constitutional: Positive for fatigue. Negative for unexpected weight change.  Eyes: Negative for visual disturbance.  Respiratory: Positive for shortness of breath (chronic and unchanged). Negative for cough and chest tightness.   Cardiovascular: Negative for chest pain, palpitations and leg swelling.  Gastrointestinal: Negative for abdominal pain.  Genitourinary: Negative for dysuria.  Neurological: Negative for dizziness, syncope, weakness, light-headedness and headaches.       Objective:   Physical Exam  Constitutional: He appears well-developed and  well-nourished.  Neck: No JVD present.  Cardiovascular: Normal rate.   Irregular rhythm  Pulmonary/Chest: Effort normal. He has no rales.  He has some faint wheezes throughout but most of his wheezing is "pseudo-wheezing "  Abdominal: Soft. There is no tenderness.  Musculoskeletal: He exhibits no edema.  Neurological: He is alert.          Assessment & Plan:  Recent atypical chest pain. Ruled out for MI. Symptomatically stable this time. Reassurance. No further evaluation at this time. He'll continue close follow up with cardiology. Continue close follow-up with Coumadin clinic. Flu vaccine already given. We have suggested that he make sure he takes all his pills with adequate fluid to make sure these are not hanging up in his esophagus

## 2015-02-02 NOTE — Progress Notes (Signed)
Pre visit review using our clinic review tool, if applicable. No additional management support is needed unless otherwise documented below in the visit note. 

## 2015-02-02 NOTE — Telephone Encounter (Signed)
This was refilled last by Murray Hodgkins. Ok to reorder or should this be sent to pcp? Please advise. Thanks, MI

## 2015-02-03 NOTE — Telephone Encounter (Signed)
Send to PCP or MD that originally prescribed

## 2015-02-07 ENCOUNTER — Other Ambulatory Visit: Payer: Self-pay | Admitting: Nurse Practitioner

## 2015-02-07 ENCOUNTER — Other Ambulatory Visit: Payer: Self-pay

## 2015-02-07 MED ORDER — GABAPENTIN 600 MG PO TABS: 600.0000 mg | ORAL_TABLET | Freq: Three times a day (TID) | ORAL | Status: DC

## 2015-02-15 ENCOUNTER — Other Ambulatory Visit: Payer: Self-pay | Admitting: Internal Medicine

## 2015-02-15 ENCOUNTER — Other Ambulatory Visit: Payer: Self-pay | Admitting: Nurse Practitioner

## 2015-02-16 ENCOUNTER — Ambulatory Visit (INDEPENDENT_AMBULATORY_CARE_PROVIDER_SITE_OTHER): Payer: Medicare Other | Admitting: General Practice

## 2015-02-16 DIAGNOSIS — I4891 Unspecified atrial fibrillation: Secondary | ICD-10-CM

## 2015-02-16 DIAGNOSIS — Z5181 Encounter for therapeutic drug level monitoring: Secondary | ICD-10-CM

## 2015-02-16 LAB — POCT INR: INR: 1.8

## 2015-02-16 NOTE — Progress Notes (Signed)
Agree with coumadin management. 

## 2015-02-16 NOTE — Progress Notes (Signed)
Pre visit review using our clinic review tool, if applicable. No additional management support is needed unless otherwise documented below in the visit note. 

## 2015-02-27 ENCOUNTER — Other Ambulatory Visit: Payer: Self-pay | Admitting: Family Medicine

## 2015-03-08 ENCOUNTER — Other Ambulatory Visit: Payer: Self-pay | Admitting: Internal Medicine

## 2015-03-16 ENCOUNTER — Ambulatory Visit (INDEPENDENT_AMBULATORY_CARE_PROVIDER_SITE_OTHER): Payer: Medicare Other | Admitting: Internal Medicine

## 2015-03-16 ENCOUNTER — Encounter: Payer: Self-pay | Admitting: Internal Medicine

## 2015-03-16 ENCOUNTER — Ambulatory Visit: Payer: Medicare Other

## 2015-03-16 VITALS — BP 132/70 | HR 73 | Ht 71.0 in | Wt 167.0 lb

## 2015-03-16 DIAGNOSIS — J449 Chronic obstructive pulmonary disease, unspecified: Secondary | ICD-10-CM | POA: Diagnosis not present

## 2015-03-16 DIAGNOSIS — J9611 Chronic respiratory failure with hypoxia: Secondary | ICD-10-CM

## 2015-03-16 NOTE — Assessment & Plan Note (Signed)
-   placed on 02 at discharge 05/16/11 2lpm 24/7 - informed 09/14/2012 pt decided to stop 02   - 02/20/2013   Walked RA x one lap @ 185 stopped due to  Sob but no desat/ legs gave out - 03/16/2015   Walked RA x one lap @ 185 ft/ slow pace/ stopped due to  84% and then given 2lpm and walked another lap s desat but didn't feel any different on vs off   Discussed in detail all the  indications, usual  risks and alternatives  relative to the benefits re amb 02 but pt declined

## 2015-03-16 NOTE — Assessment & Plan Note (Signed)
-  s/p smoking cessation 1985 - spirometry 02/19/2013 1.15 (34%) ratio 53 s neb x > 12 h prior  As I explained to this patient and wife  in detail:  although there is severe  copd present, it may not be clinically relevant:   it does not appear to be limiting activity tolerance any more than a set of worn tires limits someone from driving a car  around a parking lot.  A new set of Michelins might look good but would have no perceived impact on the performance of the car and would not be worth the cost.  That is to say:   this pt is so sedentary I don't recommend aggressive pulmonary rx at this point unless limiting symptoms arise or acute exacerbations become as issue, neither of which is really  the case now.  I asked the patient to contact this office at any time in the future should either of these problems arise.    I had an extended discussion with the patient and wife reviewing all relevant studies completed to date and  lasting 15 to 20 minutes of a 25 minute visit    Each maintenance medication was reviewed in detail including most importantly the difference between maintenance and prns and under what circumstances the prns are to be triggered using an action plan format that is not reflected in the computer generated alphabetically organized AVS.    Please see instructions for details which were reviewed in writing and the patient given a copy highlighting the part that I personally wrote and discussed at today's ov.

## 2015-03-16 NOTE — Patient Instructions (Addendum)
Only use your nebulizer if you can't catch your breath by resting /slowing down or doing a relaxed purse lip breathing pattern.  - The less you use it, the better it will work when you need it. - Ok to use up to  every 4 hours if you must but call for immediate appointment if use goes up over your usual need - Don't leave home without it !!  (think of it like the spare tire for your car)   You qualify for 02 with wallking if you need to walk more than a hundred feet and feel you want it - just call me to arrange   If you are satisfied with your treatment plan,  let your doctor know and he/she can either refill your medications or you can return here when your prescription runs out.     If in any way you are not 100% satisfied,  please tell us.  If 100% better, tell your friends!  Pulmonary follow up is as needed

## 2015-03-16 NOTE — Progress Notes (Signed)
Subjective:     Patient ID: [name], male   DOB: Sep 14, 1932, 80 y.o.   MRN: [id number]    Brief patient profile:  80 yowm  smoking cessation in the 1980s  With progressive doe x years. Reports on good day baseline can  ambulate about 100 ft before onset of significant dyspnea and usually limits his activity  . He presented to the ER on 05/06/2012 in acute distress. He was placed on BIPAP, PCCM asked to admit   Admit date: 05/06/2012  Discharge date: 05/15/2012  Discharge Diagnoses:  Purulent bronchitis/ aecopd ? How much acei effect > acei d/c'd on admit Atrial fibrillation with RVR  Acute respiratory failure  Dysphagia  HTN (hypertension)  H/O: CVA (cerebrovascular accident)  GERD (gastroesophageal reflux disease)  Acute posthemorrhagic anemia  Gastritis with hemorrhage  Stricture and stenosis of esophagus   05/22/2012 f/u ov/Wert cc about 50 % better since  Admit but leveling off in terms of activity tol and now on 2lpm, congested cough but no purulent sputum.  Has flutter not using, confused with meds/ changes. Lattie Haw helps some on prednisone taper finished on day of ov.    No purulent sputum. New leg swelling x sev days >>Cont Neb Four times a day  , mucinex /flutter , PPI Twice daily , Increase the diuril to 25 mg one daily  06/29/2012 one-month followup and medication review. Patient returns for a one-month followup and medication review. Patient brought all his medications, we reviewed them and organized them into a medication calendar with patient education. It appears the patient is taking his medications correctly Last visit was advised to increase diuretic.  CXR last ov showed bibasilar aspdz R>L .  Weight is down 9 lbs . No signiicant change in LE edema.  No significant change in dyspnea . Has DOE w/ 50- ft . Wears out easily.  No hemotpysis , chest pain , fever or orthopnea.  CAT 9 .  rec No change rx   Admit date: 07/20/2012  Discharge date: 07/29/2012   Recommendations for Outpatient Follow-up:  1. To SNF for ongoing physical therapy, OT, and rehabilitation  2. CBC and INR check on 2/28. Goal INR of 2-3.  3. Follow up cardiology within 1 month for amiodarone monitoring and dose titration 4. Follow up with primary care doctor within 1 week for repeat electrolytes, exam. Restart some home medications if able and titrate lasix as needed. Please follow up on final results of urine culture.  5. Last day of ciprofloxacin on 3/4.  6. Continue allevyn foam dressings to arm skin tears, change daily or sooner as needed.[name]  Discharge Diagnoses:  Principal Problem:  Atrial fibrillation with RVR  Active Problems:  HYPERLIPIDEMIA  PERIPHERAL NEUROPATHY  HYPERTENSION  CORONARY ARTERY DISEASE  CEREBROVASCULAR ACCIDENT  DYSPHAGIA UNSPECIFIED  GERD (gastroesophageal reflux disease)  COPD (chronic obstructive pulmonary disease)  Hypokalemia  Hypomagnesemia  Hypocalcemia  Acute kidney injury  Chronic diarrhea  Falls frequently  Leukocytosis  Normocytic anemia  Acute diastolic CHF (congestive heart failure)   09/03/2012 post hosp f/u ov/Wert cc  Chief Complaint  Patient presents with  . HFU    Pt states overall breathing is doing better since hospital d/c. He still has some DOE with minimal exertion.   cough no longer a problem, lies down ok, no 02  On symbicort 160 2bid plus prn neb still using at least a couple of times a day rec Plan A = automatic = Continue symbicort 160 Take  2 puffs first thing in am and then another 2 puffs about 12 hours later.  Work on inhaler technique:  Plan B = backup = Only use your albuterol (nebulizer)as a rescue medication to be used if you can't catch your breath by resting or doing a relaxed purse lip breathing pattern. The less you use it, the better it will work when you need it.  See Tammy NP w/in 2 weeks > not done    02/19/2013 f/u ov/Wert re: copd GOLD III Chief Complaint  Patient presents with  .  Follow-up    Breathing unchanged, occasional dry cough.    neb not helping much, did not use day of ov, confused with meds, names, how to use them. Still doe x 100 ft.  Cough is day > night rec Only use your nebulizer if you can't catch your breath by resting or doing a relaxed purse lip breathing pattern. The less you use it, the better it will work when you need it. Ok to use up to every 4 hours or down to zero if not needed - if start needing it more than usual we need to see you right away    03/16/2015  f/u ov/Wert re: GOLD III copd/ ex hypoxemia  Chief Complaint  Patient presents with  . Follow-up    Pt states needing refills on atrovent and xopenex neb sol. He is mixing these 2 meds and using them 1-2 x per day. States "breathing is not too much worse" since the last visit. He has albuterol HFA that he does not ever use.   Walks fine flat even if not w/in 4 h of treatment but took it w/in last 2 h prior to OV  - on avg uses neb twice daily  Main issue is steps > uses ramp for walking into house which is only a few steps but has to rest at top.    No obvious day to day or daytime variability or assoc chronic cough or cp or chest tightness, subjective wheeze or overt sinus or hb symptoms. No unusual exp hx or h/o childhood pna/ asthma or knowledge of premature birth.  Sleeping ok without nocturnal  or early am exacerbation  of respiratory  c/o's or need for noct saba. Also denies any obvious fluctuation of symptoms with weather or environmental changes or other aggravating or alleviating factors except as outlined above   Current Medications, Allergies, Complete Past Medical History, Past Surgical History, Family History, and Social History were reviewed in [employer] record.  ROS  The following are not active complaints unless bolded sore throat, dysphagia, dental problems, itching, sneezing,  nasal congestion or excess/ purulent secretions, ear ache,   fever,  chills, sweats, unintended wt loss, classically pleuritic or exertional cp, hemoptysis,  orthopnea pnd or leg swelling, presyncope, palpitations, abdominal pain, anorexia, nausea, vomiting, diarrhea  or change in bowel or bladder habits, change in stools or urine, dysuria,hematuria,  rash, arthralgias, visual complaints, headache, numbness, weakness or ataxia or problems with walking or coordination,  change in mood/affect or memory.           Objective:   Physical Exam  frail elderly hoarse amb wm  nad   Wt Readings from Last 3 Encounters:  03/16/15 75.751 kg (167 lb)  02/02/15 79.379 kg (175 lb)  01/28/15 76.794 kg (169 lb 4.8 oz)    Vital signs reviewed    HEENT: nl dentition, turbinates, and orophanx. Nl external ear canals without cough  reflex   NECK :  without JVD/Nodes/TM/ nl carotid upstrokes bilaterally   LUNGS: no acc muscle use, clear to A and P bilaterally without cough on insp or exp maneuvers   CV:  RRR  no s3 or murmur or increase in P2, no edema   ABD:  soft and nontender with nl excursion in the supine position. No bruits or organomegaly, bowel sounds nl  MS:  warm without deformities, calf tenderness, cyanosis or clubbing  SKIN: warm and dry without lesions    NEURO:  alert, approp, no deficits           I personally reviewed images and agree with radiology impression as follows:  CXR:   01/27/15 1. No acute pulmonary process. 2. Stable chronic findings of hyperinflation, prominent central pulmonary arteries, and retrocardiac hiatal hernia.   Assessment:

## 2015-03-21 ENCOUNTER — Other Ambulatory Visit: Payer: Self-pay | Admitting: Internal Medicine

## 2015-03-24 ENCOUNTER — Other Ambulatory Visit: Payer: Self-pay | Admitting: Family Medicine

## 2015-03-27 ENCOUNTER — Ambulatory Visit (INDEPENDENT_AMBULATORY_CARE_PROVIDER_SITE_OTHER): Payer: Medicare Other | Admitting: General Practice

## 2015-03-27 ENCOUNTER — Ambulatory Visit: Payer: Medicare Other | Admitting: Internal Medicine

## 2015-03-27 DIAGNOSIS — Z5181 Encounter for therapeutic drug level monitoring: Secondary | ICD-10-CM

## 2015-03-27 DIAGNOSIS — I4891 Unspecified atrial fibrillation: Secondary | ICD-10-CM | POA: Diagnosis not present

## 2015-03-27 LAB — POCT INR: INR: 5.2

## 2015-03-27 NOTE — Progress Notes (Signed)
Agree with Coumadin management 

## 2015-03-27 NOTE — Progress Notes (Signed)
Pre visit review using our clinic review tool, if applicable. No additional management support is needed unless otherwise documented below in the visit note. 

## 2015-03-29 ENCOUNTER — Ambulatory Visit: Payer: Medicare Other | Admitting: Internal Medicine

## 2015-03-29 ENCOUNTER — Encounter: Payer: Self-pay | Admitting: Internal Medicine

## 2015-03-29 VITALS — BP 124/62 | HR 72 | Ht 66.0 in | Wt 165.4 lb

## 2015-03-29 DIAGNOSIS — I482 Chronic atrial fibrillation, unspecified: Secondary | ICD-10-CM

## 2015-03-29 NOTE — Progress Notes (Signed)
Electrophysiology Office Note   Date:  03/29/2015   ID:  [name], DOB 1932-09-24, MRN [id number]  PCP:  [name], MD   Primary Electrophysiologist: Dr. Rayann Heman    Chief Complaint  Patient presents with  . Atrial Fibrillation  . Bradycardia     History of Present Illness: [name] is a 81 y.o. male who presents today for a visit, last seen here in 2014.  He was to the ER with an episode of CP in August, he was evaluated and discharged home felt to have been musculoskeletal and instructed to f/u with cardiology.  He denies any recurrent episodes of CP, he has chronic baseline DOE with his COPD, no palpitations, no near syncope or syncope.  Both the patient and his wife feel like he is doing pretty well, though not eating much.  He is encouraged to keep a healthy diet, need for him to eat adequately.   Past Medical History  Diagnosis Date  . History of upper gastrointestinal hemorrhage     dr. Maurene Capes, GI  . Cerebrovascular accident ([place]) [place]  . Gastric polyp   . Dysphagia, unspecified(787.20)   . Coronary artery disease     a. Nonobstructive by cath 2009 and 09/06/14  . Permanent atrial fibrillation (HCC)     INR followed at [facility].  [name] Hypertension   . Esophagitis   . GERD (gastroesophageal reflux disease)   . Erosive gastritis     H/o GIB felt secondary to this  . Gastric ulcer   . Hiatal hernia   . Internal hemorrhoids   . Hearing impairment   . Vision problems   . Chronic bronchitis ([place])     a. h/o resp failure in setting of bronchitis vs PNA 05/2012.  [name] Hyperlipidemia   . ASD (atrial septal defect)     a. s/p repair 1983.  [name] BPH (benign prostatic hyperplasia)   . COPD (chronic obstructive pulmonary disease) ([place])   . Anemia   . Depression   . LV dysfunction     a. EF 45-50% by echo 05/2012.  [name] Chronic diarrhea    Past Surgical History  Procedure Laterality Date  . Cholecystectomy  1990s  . Asd repair  [id number]  . Inguinal hernia repair  1963   . Partial small bowel resection  2005    ischemic?  [name] Enteroscopy  05/13/2012    Procedure: ENTEROSCOPY;  Surgeon: Inda Castle, MD;  Location: [facility] ENDOSCOPY;  Service: Endoscopy;  Laterality: N/A;  . Cardioversion      possibly 3 times, dr. brody/cooper  . Left heart catheterization with coronary angiogram N/A 09/06/2014    Procedure: LEFT HEART CATHETERIZATION WITH CORONARY ANGIOGRAM;  Surgeon: Jettie Booze, MD;  Location: [facility] CATH LAB;  Service: Cardiovascular;  Laterality: N/A;     Current Outpatient Prescriptions  Medication Sig Dispense Refill  . albuterol (PROVENTIL HFA;VENTOLIN HFA) 108 (90 BASE) MCG/ACT inhaler Inhale 2 puffs into the lungs every 6 (six) hours as needed for wheezing or shortness of breath. 1 Inhaler 2  . dutasteride (AVODART) 0.5 MG capsule Take 0.5 mg by mouth daily.    . furosemide (LASIX) 20 MG tablet TAKE ONE TABLET TWICE A DAY AS NEEDED FOR EDEMA (Patient taking differently: Take 20 mg by mouth daily as needed for edema. ) 180 tablet 1  . gabapentin (NEURONTIN) 600 MG tablet Take 1 tablet (600 mg total) by mouth 3 (three) times daily. 90 tablet 2  . ipratropium (ATROVENT) 0.02 %  nebulizer solution NEBULIZE 1 VAIL 4 TIMES A DAY AS DIRECTED 312.5 mL 1  . isosorbide mononitrate (IMDUR) 30 MG 24 hr tablet Take 1 tablet (30 mg total) by mouth daily. (Patient taking differently: Take 30 mg by mouth at bedtime. ) 30 tablet 6  . levalbuterol (XOPENEX) 0.63 MG/3ML nebulizer solution NEBULIZE 1 VIAL (WITH IPRATROPIUM) TWICE A DAY AS NEEDED. MAY USE EXTR A EVERY 4 HOURS IF NEEDED (Patient taking differently: NEBULIZE 1 VIAL (WITH IPRATROPIUM) daily) 216 mL 5  . mirtazapine (REMERON) 15 MG tablet Take 1 tablet (15 mg total) by mouth at bedtime. 90 tablet 2  . mupirocin ointment (BACTROBAN) 2 % Apply 1 application topically 2 (two) times daily.    . nitroGLYCERIN (NITROSTAT) 0.4 MG SL tablet Place 1 tablet (0.4 mg total) under the tongue every 5 (five) minutes x 3  doses as needed for chest pain. 25 tablet 3  . pantoprazole (PROTONIX) 40 MG tablet Take 40 mg by mouth 2 (two) times daily before a meal.    . potassium chloride (K-DUR,KLOR-CON) 10 MEQ tablet Take 20 mEq by mouth 2 (two) times daily.    [name] saccharomyces boulardii (FLORASTOR) 250 MG capsule Take 250 mg by mouth 2 (two) times daily.    . simvastatin (ZOCOR) 20 MG tablet Take 20 mg by mouth at bedtime.    . vitamin B-12 (CYANOCOBALAMIN) 500 MCG tablet Take 500 mcg by mouth daily.    [name] warfarin (COUMADIN) 1 MG tablet TAKES 2MG  ON MON, WED AND FRI TAKES 3MG  ALL OTHER DAYS (Patient taking differently: Take 2-3 mg by mouth daily at 6 PM. 6 mg on 01-26-15, 3 mg 01-27-15 then going back to 2 mg on Monday and Friday and 3 mg all other days.) 80 tablet 3   No current facility-administered medications for this visit.    Allergies:   Penicillins   Social History:  The patient  reports that he quit smoking about 31 years ago. His smoking use included Cigarettes. He smoked 0.00 packs per day for 0 years. He has never used smokeless tobacco. He reports that he does not drink alcohol or use illicit drugs.   Family History:  The patient's  family history includes Heart disease in his father and mother; Prostate cancer in his brother.    ROS:  Please see the history of present illness.   All other systems are reviewed and negative.    PHYSICAL EXAM: VS:  BP 124/62 mmHg  Pulse 72  Ht 5\' 6"  (1.676 m)  Wt 165 lb 6.4 oz (75.025 kg)  BMI 26.71 kg/m2 , BMI Body mass index is 26.71 kg/(m^2). GEN: Elderly, well nourished, well developed, in no acute distress HEENT: normal Neck: no JVD, carotid bruits, or masses Cardiac: Irregular-irregular; no significant murmurs, no rubs, or gallops,no edema  Respiratory:  clear to auscultation bilaterally, normal work of breathing GI: soft, nontender, nondistended, + BS MS: no deformity or atrophy Skin: warm and dry  Neuro:  Strength and sensation are intact Psych:  euthymic mood, full affect  EKG:  EKG is ordered today. The ekg ordered today shows AFib, RBBB, HR is 72  09/06/14: [facility] IMPRESSIONS: 1.Mild diffuse coronary artery disease as noted above. 2.LVEDP 12 mmHg.  05/05/14: Echocardiogram: Study Conclusions - Left ventricle: The cavity size was normal. Wall thickness was increased in a pattern of moderate LVH. Systolic function was normal. The estimated ejection fraction was in the range of 55% to 60%. The study is not technically sufficient to allow  evaluation of LV diastolic function. - Aortic valve: Sclerosis without stenosis. There was no regurgitation. - Left atrium: Severely dilated at 89 ml/m2. - Right ventricle: The cavity size was moderately dilated. Systolic function is reduced. - Right atrium: Severely dilated at 31 cm2. - Atrial septum: No defect or patent foramen ovale was identified. - Tricuspid valve: There was mild regurgitation. - Pulmonary arteries: PA peak pressure: 35 mm Hg (S). Impressions: - Compared to the prior echo in 2014, the EF has now normalized. There is massive, biatrial enlargement.   Recent Labs: 09/03/2014: B Natriuretic Peptide 55.6 01/27/2015: BUN 19; Potassium 4.9; Sodium 138 01/28/2015: Creatinine, Ser 1.16; Hemoglobin 11.0*; Magnesium 2.2; Platelets 149*  03/27/15: INR 5.4  Lipid Panel     Component Value Date/Time   CHOL 121 09/04/2014 0614   TRIG 134 09/04/2014 0614   HDL 42 09/04/2014 0614   CHOLHDL 2.9 09/04/2014 0614   VLDL 27 09/04/2014 0614   LDLCALC 52 09/04/2014 0614   LDLDIRECT 48.8 07/30/2013 1551     Wt Readings from Last 3 Encounters:  03/29/15 165 lb 6.4 oz (75.025 kg)  03/16/15 167 lb (75.751 kg)  02/02/15 175 lb (79.379 kg)      Other studies Reviewed: Additional studies/ records that were reviewed today include: Hospital notes   ASSESSMENT AND PLAN:  1.  Permanent Afib      HR appears controlled, he is asymptomatic      CHADS2Vasc is at least 5       On Warfarin, managed by his PMD, last INR was toxic and addressed by his PMD      Denies any bleeding or signs of bleeding, no falls  2. HTN     Appears well controlled  3. COPD     Follows with Dr. Melvyn Novas   Follow-up:  1 year with general cardiology  Current medicines are reviewed at length with the patient today.   The patient does not have concerns regarding his medicines.  The following changes were made today:  none  Labs/ tests ordered today include:  No orders of the defined types were placed in this encounter.     Army Fossa mD 03/29/2015 3:17 PM     [facility] [address] [place] [address] [phone] (office) [phone] (fax)

## 2015-03-29 NOTE — Patient Instructions (Signed)
Medication Instructions:  Your physician recommends that you continue on your current medications as directed. Please refer to the Current Medication list given to you today.   Labwork: None ordered   Testing/Procedures: None ordered   Follow-Up: Your physician wants you to follow-up in: 12 months with Richardson Dopp, PA You will receive a reminder letter in the mail two months in advance. If you don't receive a letter, please call our office to schedule the follow-up appointment.   Any Other Special Instructions Will Be Listed Below (If Applicable).     If you need a refill on your cardiac medications before your next appointment, please call your pharmacy.

## 2015-03-30 ENCOUNTER — Other Ambulatory Visit: Payer: Self-pay | Admitting: Family Medicine

## 2015-03-30 ENCOUNTER — Other Ambulatory Visit: Payer: Self-pay | Admitting: Nurse Practitioner

## 2015-04-10 ENCOUNTER — Ambulatory Visit (INDEPENDENT_AMBULATORY_CARE_PROVIDER_SITE_OTHER): Payer: Medicare Other | Admitting: General Practice

## 2015-04-10 DIAGNOSIS — Z5181 Encounter for therapeutic drug level monitoring: Secondary | ICD-10-CM | POA: Diagnosis not present

## 2015-04-10 DIAGNOSIS — I4891 Unspecified atrial fibrillation: Secondary | ICD-10-CM | POA: Diagnosis not present

## 2015-04-10 LAB — POCT INR: INR: 2.1

## 2015-04-10 NOTE — Progress Notes (Signed)
Agree with Coumadin management 

## 2015-04-10 NOTE — Progress Notes (Signed)
Pre visit review using our clinic review tool, if applicable. No additional management support is needed unless otherwise documented below in the visit note. 

## 2015-04-28 ENCOUNTER — Other Ambulatory Visit: Payer: Self-pay | Admitting: Family Medicine

## 2015-05-02 ENCOUNTER — Other Ambulatory Visit: Payer: Self-pay | Admitting: Family Medicine

## 2015-05-11 ENCOUNTER — Ambulatory Visit (INDEPENDENT_AMBULATORY_CARE_PROVIDER_SITE_OTHER): Payer: Medicare Other | Admitting: Family Medicine

## 2015-05-11 ENCOUNTER — Ambulatory Visit (INDEPENDENT_AMBULATORY_CARE_PROVIDER_SITE_OTHER): Payer: Medicare Other | Admitting: General Practice

## 2015-05-11 ENCOUNTER — Encounter: Payer: Self-pay | Admitting: Family Medicine

## 2015-05-11 VITALS — BP 110/70 | HR 73 | Temp 97.5°F | Resp 16 | Ht 66.0 in | Wt 162.6 lb

## 2015-05-11 DIAGNOSIS — Z5181 Encounter for therapeutic drug level monitoring: Secondary | ICD-10-CM | POA: Diagnosis not present

## 2015-05-11 DIAGNOSIS — R1031 Right lower quadrant pain: Secondary | ICD-10-CM

## 2015-05-11 DIAGNOSIS — I4891 Unspecified atrial fibrillation: Secondary | ICD-10-CM

## 2015-05-11 LAB — CBC WITH DIFFERENTIAL/PLATELET
BASOS ABS: 0 10*3/uL (ref 0.0–0.1)
BASOS PCT: 0.3 % (ref 0.0–3.0)
EOS ABS: 0.1 10*3/uL (ref 0.0–0.7)
Eosinophils Relative: 1.8 % (ref 0.0–5.0)
HCT: 36.2 % — ABNORMAL LOW (ref 39.0–52.0)
Hemoglobin: 11.3 g/dL — ABNORMAL LOW (ref 13.0–17.0)
Lymphocytes Relative: 15.9 % (ref 12.0–46.0)
Lymphs Abs: 1 10*3/uL (ref 0.7–4.0)
MCHC: 31.1 g/dL (ref 30.0–36.0)
MCV: 80.3 fl (ref 78.0–100.0)
MONO ABS: 0.6 10*3/uL (ref 0.1–1.0)
Monocytes Relative: 10.1 % (ref 3.0–12.0)
NEUTROS ABS: 4.6 10*3/uL (ref 1.4–7.7)
Neutrophils Relative %: 71.9 % (ref 43.0–77.0)
Platelets: 200 10*3/uL (ref 150.0–400.0)
RBC: 4.51 Mil/uL (ref 4.22–5.81)
RDW: 19 % — AB (ref 11.5–15.5)
WBC: 6.4 10*3/uL (ref 4.0–10.5)

## 2015-05-11 LAB — POCT URINALYSIS DIPSTICK
GLUCOSE UA: NEGATIVE
KETONES UA: NEGATIVE
Leukocytes, UA: NEGATIVE
Nitrite, UA: NEGATIVE
Urobilinogen, UA: 0.2
pH, UA: 5.5

## 2015-05-11 LAB — POCT INR: INR: 3.3

## 2015-05-11 NOTE — Progress Notes (Signed)
Subjective:    Patient ID: [name], male    DOB: 02-24-1933, 81 y.o.   MRN: [id number]  HPI Patient seen with intermittent right lower quadrant abdominal pain for at least 5-6 year history. He describes a dull ache sometimes can last for couple days and then resolves. Mild intensity. Currently pain-free. He has never had associated fever or chills. No dysuria. No gross hematuria. He had colonoscopy 2005. No history of diverticular disease. Denies any recent constipation. No history of kidney stones. No recent appetite or weight changes. No exacerbating or alleviating factors. He's had previous cholecystectomy.  Multiple chronic problems including history of CAD, hypertension, atrial fibrillation (on chronic Coumadin), history of CVA, history of congestive heart failure, COPD, history of gastric ulcer. No recent stool changes.  Past Medical History  Diagnosis Date  . History of upper gastrointestinal hemorrhage     dr. Maurene Capes, GI  . Cerebrovascular accident ([place]) [place]  . Gastric polyp   . Dysphagia, unspecified(787.20)   . Coronary artery disease     a. Nonobstructive by cath 2009 and 09/06/14  . Permanent atrial fibrillation (HCC)     INR followed at [facility].  [name] Hypertension   . Esophagitis   . GERD (gastroesophageal reflux disease)   . Erosive gastritis     H/o GIB felt secondary to this  . Gastric ulcer   . Hiatal hernia   . Internal hemorrhoids   . Hearing impairment   . Vision problems   . Chronic bronchitis ([place])     a. h/o resp failure in setting of bronchitis vs PNA 05/2012.  [name] Hyperlipidemia   . ASD (atrial septal defect)     a. s/p repair 1983.  [name] BPH (benign prostatic hyperplasia)   . COPD (chronic obstructive pulmonary disease) ([place])   . Anemia   . Depression   . LV dysfunction     a. EF 45-50% by echo 05/2012.  [name] Chronic diarrhea    Past Surgical History  Procedure Laterality Date  . Cholecystectomy  1990s  . Asd repair  [id number]  . Inguinal hernia repair   1963  . Partial small bowel resection  2005    ischemic?  [name] Enteroscopy  05/13/2012    Procedure: ENTEROSCOPY;  Surgeon: Inda Castle, MD;  Location: [facility] ENDOSCOPY;  Service: Endoscopy;  Laterality: N/A;  . Cardioversion      possibly 3 times, dr. brody/cooper  . Left heart catheterization with coronary angiogram N/A 09/06/2014    Procedure: LEFT HEART CATHETERIZATION WITH CORONARY ANGIOGRAM;  Surgeon: Jettie Booze, MD;  Location: [facility] CATH LAB;  Service: Cardiovascular;  Laterality: N/A;    reports that he quit smoking about 31 years ago. His smoking use included Cigarettes. He smoked 0.00 packs per day for 0 years. He has never used smokeless tobacco. He reports that he does not drink alcohol or use illicit drugs. family history includes Heart disease in his father and mother; Prostate cancer in his brother. Allergies  Allergen Reactions  . Penicillins Hives       Review of Systems  Constitutional: Negative for fever, chills, appetite change, fatigue and unexpected weight change.  Eyes: Negative for visual disturbance.  Respiratory: Negative for cough, chest tightness and shortness of breath.   Cardiovascular: Negative for chest pain, palpitations and leg swelling.  Gastrointestinal: Positive for abdominal pain. Negative for nausea, vomiting, diarrhea, constipation, blood in stool and abdominal distention.  Genitourinary: Negative for dysuria and hematuria.  Neurological: Negative for dizziness, syncope,  weakness, light-headedness and headaches.       Objective:   Physical Exam  Neck: Neck supple.  Cardiovascular:  Irregular heart rhythm, rate controlled  Pulmonary/Chest: Effort normal and breath sounds normal. He has no rales.  Abdominal: Soft. Bowel sounds are normal. He exhibits no distension and no mass. There is no tenderness. There is no rebound and no guarding.  Musculoskeletal: He exhibits no edema.  Neurological: He is alert.          Assessment & Plan:    Chronic intermittent right lower quadrant abdominal pain. Nonfocal exam. Patient had concern about appendicitis. Given duration very doubtful. He does not have any red flags such as appetite or weight changes. No history of known diverticular disease. No recent constipation. Currently pain-free. Check urinalysis and CBC. Would hold on CT abdomen and pelvis unless he has any new symptoms or worsening symptoms.

## 2015-05-11 NOTE — Progress Notes (Signed)
Pre visit review using our clinic review tool, if applicable. No additional management support is needed unless otherwise documented below in the visit note. 

## 2015-05-11 NOTE — Progress Notes (Signed)
Agree with coumadin management. 

## 2015-05-11 NOTE — Patient Instructions (Signed)

## 2015-06-05 ENCOUNTER — Other Ambulatory Visit: Payer: Self-pay | Admitting: Family Medicine

## 2015-06-07 NOTE — Telephone Encounter (Signed)
error 

## 2015-06-08 ENCOUNTER — Ambulatory Visit (INDEPENDENT_AMBULATORY_CARE_PROVIDER_SITE_OTHER): Payer: Medicare Other | Admitting: General Practice

## 2015-06-08 DIAGNOSIS — Z5181 Encounter for therapeutic drug level monitoring: Secondary | ICD-10-CM

## 2015-06-08 DIAGNOSIS — I4891 Unspecified atrial fibrillation: Secondary | ICD-10-CM | POA: Diagnosis not present

## 2015-06-08 LAB — POCT INR: INR: 2.4

## 2015-06-08 NOTE — Progress Notes (Signed)
Pre visit review using our clinic review tool, if applicable. No additional management support is needed unless otherwise documented below in the visit note. 

## 2015-06-22 ENCOUNTER — Other Ambulatory Visit: Payer: Self-pay | Admitting: Family Medicine

## 2015-06-26 ENCOUNTER — Telehealth: Payer: Self-pay | Admitting: Family Medicine

## 2015-06-26 NOTE — Telephone Encounter (Signed)
[facility]  Patient Name: [name]  DOB: [birth year]    Initial Comment Caller states husband has a sore in his nose; what can use? has MRSA, thinks related.    Nurse Assessment  Nurse: Harlow Mares, RN, Suanne Marker Date/Time (Eastern Time): 06/26/2015 3:01:53 PM  Confirm and document reason for call. If symptomatic, describe symptoms. You must click the next button to save text entered. ---Caller states husband has a sore in his nose; what can use? has MRSA, thinks related. Red and skin is open and irritated. Nose is runny, clear. Has been wiping nose a lot due to the runny nose. Denis fever.  Has the patient traveled out of the country within the last 30 days? ---Not Applicable  Does the patient have any new or worsening symptoms? ---Yes  Will a triage be completed? ---Yes  Related visit to physician within the last 2 weeks? ---No  Does the PT have any chronic conditions? (i.e. diabetes, asthma, etc.) ---Yes  List chronic conditions. ---MRSA (nose area) dx few years ago.  Is this a behavioral health or substance abuse call? ---No     Guidelines    Guideline Title Affirmed Question Affirmed Notes  Impetigo (Infected Sore) [1] Red streak or bright red area around 1 sore AND [2] no fever    Final Disposition User   See Physician within 24 Hours Harlow Mares, RN, Suanne Marker    Referrals  GO TO FACILITY REFUSED   Disagree/Comply: Disagree  Disagree/Comply Reason: Wait and see

## 2015-06-26 NOTE — Telephone Encounter (Signed)
Attempted to contact patient but unable to reach. Left voicemail for patient to return phone call.

## 2015-07-03 ENCOUNTER — Other Ambulatory Visit: Payer: Self-pay | Admitting: Family Medicine

## 2015-07-05 ENCOUNTER — Ambulatory Visit (INDEPENDENT_AMBULATORY_CARE_PROVIDER_SITE_OTHER): Payer: Medicare Other | Admitting: General Practice

## 2015-07-05 DIAGNOSIS — I4891 Unspecified atrial fibrillation: Secondary | ICD-10-CM | POA: Diagnosis not present

## 2015-07-05 DIAGNOSIS — Z5181 Encounter for therapeutic drug level monitoring: Secondary | ICD-10-CM | POA: Diagnosis not present

## 2015-07-05 LAB — POCT INR: INR: 2.3

## 2015-07-05 NOTE — Progress Notes (Signed)
Pre visit review using our clinic review tool, if applicable. No additional management support is needed unless otherwise documented below in the visit note. 

## 2015-07-14 ENCOUNTER — Other Ambulatory Visit: Payer: Self-pay | Admitting: Internal Medicine

## 2015-07-14 ENCOUNTER — Other Ambulatory Visit: Payer: Self-pay | Admitting: Family Medicine

## 2015-08-02 ENCOUNTER — Ambulatory Visit (INDEPENDENT_AMBULATORY_CARE_PROVIDER_SITE_OTHER): Payer: Medicare Other | Admitting: General Practice

## 2015-08-02 DIAGNOSIS — Z5181 Encounter for therapeutic drug level monitoring: Secondary | ICD-10-CM | POA: Diagnosis not present

## 2015-08-02 DIAGNOSIS — I4891 Unspecified atrial fibrillation: Secondary | ICD-10-CM | POA: Diagnosis not present

## 2015-08-02 LAB — POCT INR: INR: 1.4

## 2015-08-02 NOTE — Progress Notes (Signed)
Pre visit review using our clinic review tool, if applicable. No additional management support is needed unless otherwise documented below in the visit note. INR is low today.  Encouraged patient to purchase and use a pill box to manage medications.  Reiterated the risks of a sub-therapeutic INR.  Patient verbalized understanding.

## 2015-08-14 ENCOUNTER — Other Ambulatory Visit: Payer: Self-pay | Admitting: Family Medicine

## 2015-08-30 ENCOUNTER — Ambulatory Visit (INDEPENDENT_AMBULATORY_CARE_PROVIDER_SITE_OTHER): Payer: Medicare Other | Admitting: General Practice

## 2015-08-30 DIAGNOSIS — I4891 Unspecified atrial fibrillation: Secondary | ICD-10-CM

## 2015-08-30 DIAGNOSIS — Z5181 Encounter for therapeutic drug level monitoring: Secondary | ICD-10-CM

## 2015-08-30 LAB — POCT INR: INR: 1.4

## 2015-08-30 NOTE — Progress Notes (Signed)
Pre visit review using our clinic review tool, if applicable. No additional management support is needed unless otherwise documented below in the visit note. INR is low today.  Patient has missed some doses.  Encouraged patient to use a pill box to manage medications.  Boosted for 2 days and then resumed current dosage.  Re-check in 2 weeks.  Patient and wife verbalized understanding.

## 2015-09-13 ENCOUNTER — Ambulatory Visit (INDEPENDENT_AMBULATORY_CARE_PROVIDER_SITE_OTHER): Payer: Medicare Other | Admitting: General Practice

## 2015-09-13 DIAGNOSIS — Z5181 Encounter for therapeutic drug level monitoring: Secondary | ICD-10-CM

## 2015-09-13 DIAGNOSIS — I4891 Unspecified atrial fibrillation: Secondary | ICD-10-CM

## 2015-09-13 LAB — POCT INR: INR: 1.6

## 2015-09-13 NOTE — Progress Notes (Signed)
Pre visit review using our clinic review tool, if applicable. No additional management support is needed unless otherwise documented below in the visit note. 

## 2015-09-18 ENCOUNTER — Other Ambulatory Visit: Payer: Self-pay | Admitting: Family Medicine

## 2015-10-05 ENCOUNTER — Other Ambulatory Visit: Payer: Self-pay | Admitting: Family Medicine

## 2015-10-11 ENCOUNTER — Telehealth: Payer: Self-pay | Admitting: Family Medicine

## 2015-10-11 ENCOUNTER — Ambulatory Visit (INDEPENDENT_AMBULATORY_CARE_PROVIDER_SITE_OTHER): Payer: Medicare Other | Admitting: General Practice

## 2015-10-11 DIAGNOSIS — I4891 Unspecified atrial fibrillation: Secondary | ICD-10-CM | POA: Diagnosis not present

## 2015-10-11 DIAGNOSIS — Z5181 Encounter for therapeutic drug level monitoring: Secondary | ICD-10-CM | POA: Diagnosis not present

## 2015-10-11 LAB — POCT INR: INR: 1.9

## 2015-10-11 NOTE — Telephone Encounter (Signed)
Patient's wife wants a call back.  There is something she wants to discuss.

## 2015-10-11 NOTE — Progress Notes (Signed)
Pre visit review using our clinic review tool, if applicable. No additional management support is needed unless otherwise documented below in the visit note. 

## 2015-10-12 NOTE — Telephone Encounter (Signed)
Spoke with pts wife. She is agreeable to have pt come in for an annual physical exam. She is aware that she will be contacted in the next few days to have him schedule. He should prolly come fasting. Thanks you.

## 2015-10-18 NOTE — Telephone Encounter (Signed)
Pt has been scheduled.  °

## 2015-11-07 ENCOUNTER — Other Ambulatory Visit (INDEPENDENT_AMBULATORY_CARE_PROVIDER_SITE_OTHER): Payer: Medicare Other

## 2015-11-07 DIAGNOSIS — Z0000 Encounter for general adult medical examination without abnormal findings: Secondary | ICD-10-CM | POA: Diagnosis not present

## 2015-11-07 LAB — CBC WITH DIFFERENTIAL/PLATELET
BASOS ABS: 0 10*3/uL (ref 0.0–0.1)
BASOS PCT: 0.7 % (ref 0.0–3.0)
EOS PCT: 3.1 % (ref 0.0–5.0)
Eosinophils Absolute: 0.1 10*3/uL (ref 0.0–0.7)
HEMATOCRIT: 38.7 % — AB (ref 39.0–52.0)
Hemoglobin: 12.6 g/dL — ABNORMAL LOW (ref 13.0–17.0)
LYMPHS ABS: 1.1 10*3/uL (ref 0.7–4.0)
Lymphocytes Relative: 24.3 % (ref 12.0–46.0)
MCHC: 32.5 g/dL (ref 30.0–36.0)
MCV: 78.8 fl (ref 78.0–100.0)
MONOS PCT: 9.8 % (ref 3.0–12.0)
Monocytes Absolute: 0.4 10*3/uL (ref 0.1–1.0)
NEUTROS ABS: 2.7 10*3/uL (ref 1.4–7.7)
NEUTROS PCT: 62.1 % (ref 43.0–77.0)
PLATELETS: 161 10*3/uL (ref 150.0–400.0)
RBC: 4.9 Mil/uL (ref 4.22–5.81)
RDW: 18.5 % — AB (ref 11.5–15.5)
WBC: 4.3 10*3/uL (ref 4.0–10.5)

## 2015-11-07 LAB — LIPID PANEL
CHOLESTEROL: 101 mg/dL (ref 0–200)
HDL: 43.8 mg/dL (ref >39.00)
LDL Cholesterol: 36 mg/dL (ref 0–99)
NonHDL: 57.48
TRIGLYCERIDES: 107 mg/dL (ref 0.0–149.0)
Total CHOL/HDL Ratio: 2
VLDL: 21.4 mg/dL (ref 0.0–40.0)

## 2015-11-07 LAB — BASIC METABOLIC PANEL
BUN: 17 mg/dL (ref 6–23)
CALCIUM: 9 mg/dL (ref 8.4–10.5)
CO2: 30 mEq/L (ref 19–32)
Chloride: 107 mEq/L (ref 96–112)
Creatinine, Ser: 1.06 mg/dL (ref 0.40–1.50)
GFR: 71.2 mL/min (ref >60.00)
GLUCOSE: 93 mg/dL (ref 70–99)
Potassium: 4.2 mEq/L (ref 3.5–5.1)
SODIUM: 143 meq/L (ref 135–145)

## 2015-11-07 LAB — HEPATIC FUNCTION PANEL
ALBUMIN: 4 g/dL (ref 3.5–5.2)
ALT: 13 U/L (ref 0–53)
AST: 21 U/L (ref 0–37)
Alkaline Phosphatase: 57 U/L (ref 39–117)
Bilirubin, Direct: 0.2 mg/dL (ref 0.0–0.3)
TOTAL PROTEIN: 6.8 g/dL (ref 6.0–8.3)
Total Bilirubin: 0.7 mg/dL (ref 0.2–1.2)

## 2015-11-07 LAB — PSA: PSA: 3.11 ng/mL (ref 0.10–4.00)

## 2015-11-07 LAB — TSH: TSH: 1.55 u[IU]/mL (ref 0.35–4.50)

## 2015-11-08 ENCOUNTER — Other Ambulatory Visit: Payer: Medicare Other

## 2015-11-08 ENCOUNTER — Ambulatory Visit: Payer: Medicare Other

## 2015-11-15 ENCOUNTER — Encounter: Payer: Self-pay | Admitting: Family Medicine

## 2015-11-15 ENCOUNTER — Ambulatory Visit (INDEPENDENT_AMBULATORY_CARE_PROVIDER_SITE_OTHER): Payer: Medicare Other | Admitting: General Practice

## 2015-11-15 ENCOUNTER — Ambulatory Visit (INDEPENDENT_AMBULATORY_CARE_PROVIDER_SITE_OTHER): Payer: Medicare Other | Admitting: Family Medicine

## 2015-11-15 ENCOUNTER — Ambulatory Visit: Payer: Medicare Other

## 2015-11-15 VITALS — BP 130/80 | HR 95 | Temp 98.0°F | Ht 66.0 in | Wt 149.8 lb

## 2015-11-15 DIAGNOSIS — L570 Actinic keratosis: Secondary | ICD-10-CM

## 2015-11-15 DIAGNOSIS — Z5181 Encounter for therapeutic drug level monitoring: Secondary | ICD-10-CM

## 2015-11-15 DIAGNOSIS — R413 Other amnesia: Secondary | ICD-10-CM

## 2015-11-15 DIAGNOSIS — Z0000 Encounter for general adult medical examination without abnormal findings: Secondary | ICD-10-CM

## 2015-11-15 DIAGNOSIS — I4891 Unspecified atrial fibrillation: Secondary | ICD-10-CM | POA: Diagnosis not present

## 2015-11-15 LAB — POCT INR: INR: 1.8

## 2015-11-15 MED ORDER — RIVASTIGMINE 4.6 MG/24HR TD PT24: 4.6000 mg | MEDICATED_PATCH | Freq: Every day | TRANSDERMAL | Status: DC

## 2015-11-15 NOTE — Progress Notes (Signed)
Subjective:    Patient ID: [name], male    DOB: 1932/10/04, 82 y.o.   MRN: [id number]  HPI Patient here for physical exam and medical follow-up. Multiple chronic problems including chronic kidney disease, history of protein calorie medication, chronic anemia, history of upper GI bleed, atrial fibrillation, hyperlipidemia, hypertension, history of CVA, CAD, COPD He is very sedentary. No recent falls. No recent chest pains.  Wife has noted this short-term memory seems to be lapsing. He frequently repeats himself. She would like to have him tested further today. He does have history of B12 deficiency but is on regular placement. Recent TSH normal. No recent head injury.  No focal weakness but is generally weak.  He's had a long battle with poor appetite in recent years. Eating is slightly better compared to couple years ago and weight is stable. He is on chronic Coumadin. No recent bleeds. Remains on Remeron and they think that is helping his memory.  Past Medical History  Diagnosis Date  . History of upper gastrointestinal hemorrhage     dr. Maurene Capes, GI  . Cerebrovascular accident ([place]) [place]  . Gastric polyp   . Dysphagia, unspecified(787.20)   . Coronary artery disease     a. Nonobstructive by cath 2009 and 09/06/14  . Permanent atrial fibrillation (HCC)     INR [facility] at [facility].  [name] Hypertension   . Esophagitis   . GERD (gastroesophageal reflux disease)   . Erosive gastritis     H/o GIB felt secondary to this  . Gastric ulcer   . Hiatal hernia   . Internal hemorrhoids   . Hearing impairment   . Vision problems   . Chronic bronchitis ([place])     a. h/o resp failure in setting of bronchitis vs PNA 05/2012.  [name] Hyperlipidemia   . ASD (atrial septal defect)     a. s/p repair 1983.  [name] BPH (benign prostatic hyperplasia)   . COPD (chronic obstructive pulmonary disease) ([place])   . Anemia   . Depression   . LV dysfunction     a. EF 45-50% by echo 05/2012.  [name] Chronic diarrhea      Past Surgical History  Procedure Laterality Date  . Cholecystectomy  1990s  . Asd repair  [id number]  . Inguinal hernia repair  1963  . Partial small bowel resection  2005    ischemic?  [name] Enteroscopy  05/13/2012    Procedure: ENTEROSCOPY;  Surgeon: Inda Castle, MD;  Location: [facility] ENDOSCOPY;  Service: Endoscopy;  Laterality: N/A;  . Cardioversion      possibly 3 times, dr. brody/cooper  . Left heart catheterization with coronary angiogram N/A 09/06/2014    Procedure: LEFT HEART CATHETERIZATION WITH CORONARY ANGIOGRAM;  Surgeon: Jettie Booze, MD;  Location: [facility] CATH LAB;  Service: Cardiovascular;  Laterality: N/A;    reports that he quit smoking about 32 years ago. His smoking use included Cigarettes. He smoked 0.00 packs per day for 0 years. He has never used smokeless tobacco. He reports that he does not drink alcohol or use illicit drugs. family history includes Heart disease in his father and mother; Prostate cancer in his brother. Allergies  Allergen Reactions  . Penicillins Hives       Review of Systems  Constitutional: Positive for fatigue. Negative for fever, activity change, appetite change and unexpected weight change.  HENT: Negative for congestion, ear pain and trouble swallowing.   Eyes: Negative for pain and visual disturbance.  Respiratory: Negative for  cough, shortness of breath and wheezing.   Cardiovascular: Negative for chest pain and palpitations.  Gastrointestinal: Negative for nausea, vomiting, abdominal pain, diarrhea, constipation, blood in stool, abdominal distention and rectal pain.  Genitourinary: Negative for dysuria, hematuria and testicular pain.  Musculoskeletal: Negative for joint swelling and arthralgias.  Skin: Negative for rash.  Neurological: Positive for weakness. Negative for dizziness, syncope and headaches.  Hematological: Negative for adenopathy.  Psychiatric/Behavioral: Negative for confusion and dysphoric mood.       Objective:    Physical Exam  Constitutional: He appears well-developed and well-nourished. No distress.  HENT:  Head: Normocephalic and atraumatic.  Right Ear: External ear normal.  Left Ear: External ear normal.  Mouth/Throat: Oropharynx is clear and moist.  Eyes: Conjunctivae and EOM are normal. Pupils are equal, round, and reactive to light.  Neck: Normal range of motion. Neck supple. No thyromegaly present.  Cardiovascular: Normal rate and normal heart sounds.   Pulmonary/Chest: No respiratory distress. He has no wheezes. He has no rales.  Abdominal: Soft. Bowel sounds are normal. He exhibits no distension and no mass. There is no tenderness. There is no rebound and no guarding.  Musculoskeletal: He exhibits no edema.  Lymphadenopathy:    He has no cervical adenopathy.  Neurological: He is alert. He displays normal reflexes. No cranial nerve deficit.  Skin: No rash noted.  He has thickened hyperkeratotic area dorsum of his left hand. No ulceration. Area is 1 cm diameter  Psychiatric: He has a normal mood and affect.  MMSE 19/30          Assessment & Plan:  #1 physical exam. We've not recommended further colonoscopy or PSA given his age and multiple comorbidities. He actually had PSA as part of his labs and labs were reviewed. Immunizations are up-to-date. Continue yearly flu vaccine. They think he has had previous shingles vaccine and they will confirm vaccination status  #2 actinic keratosis dorsum left hand. We discussed risk and benefits of treatment with liquid nitrogen and patient consented. Treated without difficulty.  #3 cognitive impairment. MMSE 19/30. Differential is multi-infarct dementia versus possible Alzheimer's dementia. He is on B12 replacement. Recent TSH normal. Discussed options. They're not interested in pill options. We discussed possible trial of Exelon patch 4.6 mg one patch daily and reassess in one month. We reviewed potential side effects. Titrate further at that time  if tolerating well. With his prior hx of poor appetite will use above with caution and low threshold to d/c if any appetite suppression or weight loss or nausea.    [name] MD [facility]

## 2015-11-15 NOTE — Progress Notes (Signed)
Pre visit review using our clinic review tool, if applicable. No additional management support is needed unless otherwise documented below in the visit note. 

## 2015-11-16 ENCOUNTER — Other Ambulatory Visit: Payer: Self-pay | Admitting: Family Medicine

## 2015-11-16 DIAGNOSIS — R413 Other amnesia: Secondary | ICD-10-CM | POA: Insufficient documentation

## 2015-11-16 DIAGNOSIS — L570 Actinic keratosis: Secondary | ICD-10-CM | POA: Insufficient documentation

## 2015-12-06 ENCOUNTER — Other Ambulatory Visit: Payer: Self-pay | Admitting: Family Medicine

## 2015-12-06 NOTE — Telephone Encounter (Signed)
Rx refill sent to pharmacy. 

## 2015-12-13 ENCOUNTER — Ambulatory Visit (INDEPENDENT_AMBULATORY_CARE_PROVIDER_SITE_OTHER): Payer: Medicare Other | Admitting: General Practice

## 2015-12-13 DIAGNOSIS — I4891 Unspecified atrial fibrillation: Secondary | ICD-10-CM

## 2015-12-13 DIAGNOSIS — Z5181 Encounter for therapeutic drug level monitoring: Secondary | ICD-10-CM

## 2015-12-13 LAB — POCT INR: INR: 1.8

## 2015-12-13 NOTE — Progress Notes (Signed)
Pre visit review using our clinic review tool, if applicable. No additional management support is needed unless otherwise documented below in the visit note. 

## 2015-12-25 ENCOUNTER — Other Ambulatory Visit: Payer: Self-pay | Admitting: Family Medicine

## 2016-01-10 ENCOUNTER — Ambulatory Visit (INDEPENDENT_AMBULATORY_CARE_PROVIDER_SITE_OTHER): Payer: Medicare Other | Admitting: General Practice

## 2016-01-10 DIAGNOSIS — Z5181 Encounter for therapeutic drug level monitoring: Secondary | ICD-10-CM

## 2016-01-10 LAB — POCT INR: INR: 2

## 2016-01-11 ENCOUNTER — Inpatient Hospital Stay (HOSPITAL_COMMUNITY)
Admission: EM | Admit: 2016-01-11 | Discharge: 2016-01-15 | DRG: 175 | Disposition: A | Discharge status: Skilled Nursing Facility | Payer: Medicare Other | Attending: Internal Medicine | Admitting: Internal Medicine

## 2016-01-11 ENCOUNTER — Encounter (HOSPITAL_COMMUNITY): Payer: Self-pay | Admitting: Nurse Practitioner

## 2016-01-11 ENCOUNTER — Emergency Department (HOSPITAL_COMMUNITY): Payer: Medicare Other

## 2016-01-11 DIAGNOSIS — I248 Other forms of acute ischemic heart disease: Secondary | ICD-10-CM | POA: Diagnosis present

## 2016-01-11 DIAGNOSIS — I482 Chronic atrial fibrillation: Secondary | ICD-10-CM | POA: Diagnosis present

## 2016-01-11 DIAGNOSIS — Z880 Allergy status to penicillin: Secondary | ICD-10-CM

## 2016-01-11 DIAGNOSIS — K219 Gastro-esophageal reflux disease without esophagitis: Secondary | ICD-10-CM | POA: Diagnosis present

## 2016-01-11 DIAGNOSIS — E440 Moderate protein-calorie malnutrition: Secondary | ICD-10-CM | POA: Diagnosis present

## 2016-01-11 DIAGNOSIS — I2699 Other pulmonary embolism without acute cor pulmonale: Secondary | ICD-10-CM | POA: Diagnosis present

## 2016-01-11 DIAGNOSIS — I2510 Atherosclerotic heart disease of native coronary artery without angina pectoris: Secondary | ICD-10-CM | POA: Diagnosis present

## 2016-01-11 DIAGNOSIS — H9190 Unspecified hearing loss, unspecified ear: Secondary | ICD-10-CM | POA: Diagnosis present

## 2016-01-11 DIAGNOSIS — R791 Abnormal coagulation profile: Secondary | ICD-10-CM | POA: Diagnosis present

## 2016-01-11 DIAGNOSIS — J9601 Acute respiratory failure with hypoxia: Secondary | ICD-10-CM | POA: Diagnosis present

## 2016-01-11 DIAGNOSIS — R1310 Dysphagia, unspecified: Secondary | ICD-10-CM | POA: Diagnosis present

## 2016-01-11 DIAGNOSIS — J438 Other emphysema: Secondary | ICD-10-CM

## 2016-01-11 DIAGNOSIS — E872 Acidosis: Secondary | ICD-10-CM | POA: Diagnosis present

## 2016-01-11 DIAGNOSIS — I4891 Unspecified atrial fibrillation: Secondary | ICD-10-CM | POA: Diagnosis present

## 2016-01-11 DIAGNOSIS — I10 Essential (primary) hypertension: Secondary | ICD-10-CM | POA: Diagnosis present

## 2016-01-11 DIAGNOSIS — R0602 Shortness of breath: Secondary | ICD-10-CM

## 2016-01-11 DIAGNOSIS — I69354 Hemiplegia and hemiparesis following cerebral infarction affecting left non-dominant side: Secondary | ICD-10-CM | POA: Diagnosis not present

## 2016-01-11 DIAGNOSIS — N183 Chronic kidney disease, stage 3 unspecified: Secondary | ICD-10-CM | POA: Diagnosis present

## 2016-01-11 DIAGNOSIS — Z66 Do not resuscitate: Secondary | ICD-10-CM | POA: Diagnosis present

## 2016-01-11 DIAGNOSIS — D638 Anemia in other chronic diseases classified elsewhere: Secondary | ICD-10-CM | POA: Diagnosis present

## 2016-01-11 DIAGNOSIS — R29810 Facial weakness: Secondary | ICD-10-CM | POA: Diagnosis present

## 2016-01-11 DIAGNOSIS — E785 Hyperlipidemia, unspecified: Secondary | ICD-10-CM | POA: Diagnosis present

## 2016-01-11 DIAGNOSIS — N400 Enlarged prostate without lower urinary tract symptoms: Secondary | ICD-10-CM | POA: Diagnosis present

## 2016-01-11 DIAGNOSIS — J441 Chronic obstructive pulmonary disease with (acute) exacerbation: Secondary | ICD-10-CM | POA: Diagnosis present

## 2016-01-11 DIAGNOSIS — R627 Adult failure to thrive: Secondary | ICD-10-CM | POA: Diagnosis present

## 2016-01-11 DIAGNOSIS — N179 Acute kidney failure, unspecified: Secondary | ICD-10-CM | POA: Diagnosis present

## 2016-01-11 DIAGNOSIS — E46 Unspecified protein-calorie malnutrition: Secondary | ICD-10-CM | POA: Diagnosis present

## 2016-01-11 DIAGNOSIS — Z7901 Long term (current) use of anticoagulants: Secondary | ICD-10-CM

## 2016-01-11 DIAGNOSIS — I6350 Cerebral infarction due to unspecified occlusion or stenosis of unspecified cerebral artery: Secondary | ICD-10-CM | POA: Diagnosis present

## 2016-01-11 DIAGNOSIS — G609 Hereditary and idiopathic neuropathy, unspecified: Secondary | ICD-10-CM | POA: Diagnosis present

## 2016-01-11 DIAGNOSIS — Z79899 Other long term (current) drug therapy: Secondary | ICD-10-CM

## 2016-01-11 DIAGNOSIS — I129 Hypertensive chronic kidney disease with stage 1 through stage 4 chronic kidney disease, or unspecified chronic kidney disease: Secondary | ICD-10-CM | POA: Diagnosis present

## 2016-01-11 DIAGNOSIS — R413 Other amnesia: Secondary | ICD-10-CM | POA: Diagnosis present

## 2016-01-11 DIAGNOSIS — Z8673 Personal history of transient ischemic attack (TIA), and cerebral infarction without residual deficits: Secondary | ICD-10-CM

## 2016-01-11 DIAGNOSIS — F329 Major depressive disorder, single episode, unspecified: Secondary | ICD-10-CM | POA: Diagnosis present

## 2016-01-11 DIAGNOSIS — J449 Chronic obstructive pulmonary disease, unspecified: Secondary | ICD-10-CM | POA: Diagnosis present

## 2016-01-11 DIAGNOSIS — Z87891 Personal history of nicotine dependence: Secondary | ICD-10-CM

## 2016-01-11 DIAGNOSIS — Z91048 Other nonmedicinal substance allergy status: Secondary | ICD-10-CM

## 2016-01-11 DIAGNOSIS — Z8249 Family history of ischemic heart disease and other diseases of the circulatory system: Secondary | ICD-10-CM

## 2016-01-11 LAB — I-STAT CHEM 8, ED
BUN: 13 mg/dL (ref 6–20)
CHLORIDE: 102 mmol/L (ref 101–111)
Calcium, Ion: 1.15 mmol/L (ref 1.12–1.23)
Creatinine, Ser: 1 mg/dL (ref 0.61–1.24)
Glucose, Bld: 155 mg/dL — ABNORMAL HIGH (ref 65–99)
HCT: 47 % (ref 39.0–52.0)
Hemoglobin: 16 g/dL (ref 13.0–17.0)
POTASSIUM: 3.1 mmol/L — AB (ref 3.5–5.1)
SODIUM: 144 mmol/L (ref 135–145)
TCO2: 25 mmol/L (ref 0–100)

## 2016-01-11 LAB — I-STAT VENOUS BLOOD GAS, ED
ACID-BASE DEFICIT: 2 mmol/L (ref 0.0–2.0)
BICARBONATE: 25.5 meq/L — AB (ref 20.0–24.0)
O2 Saturation: 52 %
TCO2: 27 mmol/L (ref 0–100)
pCO2, Ven: 53.6 mmHg — ABNORMAL HIGH (ref 45.0–50.0)
pH, Ven: 7.285 (ref 7.250–7.300)
pO2, Ven: 31 mmHg (ref 31.0–45.0)

## 2016-01-11 LAB — COMPREHENSIVE METABOLIC PANEL
ALBUMIN: 4.1 g/dL (ref 3.5–5.0)
ALT: 17 U/L (ref 17–63)
ANION GAP: 10 (ref 5–15)
AST: 26 U/L (ref 15–41)
Alkaline Phosphatase: 58 U/L (ref 38–126)
BILIRUBIN TOTAL: 0.6 mg/dL (ref 0.3–1.2)
BUN: 11 mg/dL (ref 6–20)
CO2: 27 mmol/L (ref 22–32)
Calcium: 9.1 mg/dL (ref 8.9–10.3)
Chloride: 104 mmol/L (ref 101–111)
Creatinine, Ser: 1.1 mg/dL (ref 0.61–1.24)
GFR calc Af Amer: >60 mL/min (ref >60)
GLUCOSE: 161 mg/dL — AB (ref 65–99)
POTASSIUM: 3 mmol/L — AB (ref 3.5–5.1)
Sodium: 141 mmol/L (ref 135–145)
TOTAL PROTEIN: 7.3 g/dL (ref 6.5–8.1)

## 2016-01-11 LAB — CBC WITH DIFFERENTIAL/PLATELET
BASOS PCT: 1 %
Basophils Absolute: 0 10*3/uL (ref 0.0–0.1)
EOS PCT: 2 %
Eosinophils Absolute: 0.1 10*3/uL (ref 0.0–0.7)
HEMATOCRIT: 45.8 % (ref 39.0–52.0)
Hemoglobin: 14.3 g/dL (ref 13.0–17.0)
Lymphocytes Relative: 28 %
Lymphs Abs: 1.1 10*3/uL (ref 0.7–4.0)
MCH: 27.6 pg (ref 26.0–34.0)
MCHC: 31.2 g/dL (ref 30.0–36.0)
MCV: 88.4 fL (ref 78.0–100.0)
MONO ABS: 0.2 10*3/uL (ref 0.1–1.0)
MONOS PCT: 4 %
NEUTROS ABS: 2.7 10*3/uL (ref 1.7–7.7)
NEUTROS PCT: 67 %
Platelets: 139 10*3/uL — ABNORMAL LOW (ref 150–400)
RBC: 5.18 MIL/uL (ref 4.22–5.81)
RDW: 16.4 % — AB (ref 11.5–15.5)
WBC: 4.1 10*3/uL (ref 4.0–10.5)

## 2016-01-11 LAB — MRSA PCR SCREENING: MRSA BY PCR: NEGATIVE

## 2016-01-11 LAB — I-STAT TROPONIN, ED: TROPONIN I, POC: 0.01 ng/mL (ref 0.00–0.08)

## 2016-01-11 LAB — LACTIC ACID, PLASMA: LACTIC ACID, VENOUS: 8.1 mmol/L — AB (ref 0.5–1.9)

## 2016-01-11 LAB — I-STAT CG4 LACTIC ACID, ED
LACTIC ACID, VENOUS: 3.64 mmol/L — AB (ref 0.5–1.9)
Lactic Acid, Venous: 7.27 mmol/L (ref 0.5–1.9)

## 2016-01-11 LAB — PROTIME-INR
INR: 1.95
PROTHROMBIN TIME: 22.5 s — AB (ref 11.4–15.2)

## 2016-01-11 LAB — PROCALCITONIN: Procalcitonin: <0.1 ng/mL

## 2016-01-11 LAB — BRAIN NATRIURETIC PEPTIDE: B Natriuretic Peptide: 86.2 pg/mL (ref 0.0–100.0)

## 2016-01-11 LAB — TROPONIN I: Troponin I: 0.49 ng/mL (ref <0.03)

## 2016-01-11 MED ORDER — DEXTROSE 5 % IV SOLN
2.0000 g | Freq: Three times a day (TID) | INTRAVENOUS | Status: DC
  Administered 2016-01-11: 2 g via INTRAVENOUS
  Filled 2016-01-11: qty 2

## 2016-01-11 MED ORDER — SODIUM CHLORIDE 0.9 % IV SOLN
1500.0000 mg | Freq: Once | INTRAVENOUS | Status: AC
  Administered 2016-01-11: 1500 mg via INTRAVENOUS
  Filled 2016-01-11: qty 1500

## 2016-01-11 MED ORDER — POTASSIUM CHLORIDE CRYS ER 20 MEQ PO TBCR
40.0000 meq | EXTENDED_RELEASE_TABLET | Freq: Once | ORAL | Status: AC
  Administered 2016-01-11: 40 meq via ORAL
  Filled 2016-01-11: qty 2

## 2016-01-11 MED ORDER — LEVALBUTEROL HCL 1.25 MG/0.5ML IN NEBU: 1.2500 mg | INHALATION_SOLUTION | RESPIRATORY_TRACT | Status: DC | PRN

## 2016-01-11 MED ORDER — DEXTROSE 5 % IV SOLN
5.0000 mg/h | INTRAVENOUS | Status: DC
  Administered 2016-01-11: 5 mg/h via INTRAVENOUS
  Filled 2016-01-11: qty 100

## 2016-01-11 MED ORDER — SIMVASTATIN 20 MG PO TABS
20.0000 mg | ORAL_TABLET | Freq: Every day | ORAL | Status: DC
  Administered 2016-01-11: 20 mg via ORAL
  Filled 2016-01-11: qty 1

## 2016-01-11 MED ORDER — ASPIRIN 81 MG PO CHEW
324.0000 mg | CHEWABLE_TABLET | Freq: Once | ORAL | Status: AC
  Administered 2016-01-11: 324 mg via ORAL
  Filled 2016-01-11: qty 4

## 2016-01-11 MED ORDER — ALBUTEROL SULFATE (2.5 MG/3ML) 0.083% IN NEBU
5.0000 mg | INHALATION_SOLUTION | Freq: Once | RESPIRATORY_TRACT | Status: AC
  Administered 2016-01-11: 5 mg via RESPIRATORY_TRACT
  Filled 2016-01-11: qty 6

## 2016-01-11 MED ORDER — LEVALBUTEROL HCL 1.25 MG/0.5ML IN NEBU: 1.2500 mg | INHALATION_SOLUTION | Freq: Four times a day (QID) | RESPIRATORY_TRACT | Status: DC | PRN

## 2016-01-11 MED ORDER — DUTASTERIDE 0.5 MG PO CAPS
0.5000 mg | ORAL_CAPSULE | Freq: Every day | ORAL | Status: DC
  Administered 2016-01-12 – 2016-01-15 (×4): 0.5 mg via ORAL
  Filled 2016-01-11 (×4): qty 1

## 2016-01-11 MED ORDER — DILTIAZEM LOAD VIA INFUSION
10.0000 mg | Freq: Once | INTRAVENOUS | Status: AC
  Administered 2016-01-11: 10 mg via INTRAVENOUS
  Filled 2016-01-11: qty 10

## 2016-01-11 MED ORDER — VITAMIN B-12 1000 MCG PO TABS
500.0000 ug | ORAL_TABLET | Freq: Every day | ORAL | Status: DC
  Administered 2016-01-12 – 2016-01-15 (×4): 500 ug via ORAL
  Filled 2016-01-11 (×4): qty 1

## 2016-01-11 MED ORDER — SODIUM CHLORIDE 0.9% FLUSH
3.0000 mL | Freq: Two times a day (BID) | INTRAVENOUS | Status: DC
  Administered 2016-01-12 – 2016-01-14 (×5): 3 mL via INTRAVENOUS
  Administered 2016-01-14: 10 mL via INTRAVENOUS

## 2016-01-11 MED ORDER — HEPARIN (PORCINE) IN NACL 100-0.45 UNIT/ML-% IJ SOLN
1000.0000 [IU]/h | INTRAMUSCULAR | Status: DC
  Administered 2016-01-11 – 2016-01-12 (×2): 1150 [IU]/h via INTRAVENOUS
  Filled 2016-01-11: qty 250

## 2016-01-11 MED ORDER — METHYLPREDNISOLONE SODIUM SUCC 125 MG IJ SOLR
125.0000 mg | Freq: Once | INTRAMUSCULAR | Status: AC
  Administered 2016-01-11: 125 mg via INTRAVENOUS

## 2016-01-11 MED ORDER — VITAMIN B-12 500 MCG PO TABS: 500.0000 ug | ORAL_TABLET | Freq: Every day | ORAL | Status: DC

## 2016-01-11 MED ORDER — SODIUM CHLORIDE 0.9 % IV BOLUS (SEPSIS)
1000.0000 mL | Freq: Once | INTRAVENOUS | Status: AC
  Administered 2016-01-11: 1000 mL via INTRAVENOUS

## 2016-01-11 MED ORDER — HEPARIN BOLUS VIA INFUSION
4000.0000 [IU] | Freq: Once | INTRAVENOUS | Status: AC
  Administered 2016-01-11: 4000 [IU] via INTRAVENOUS
  Filled 2016-01-11: qty 4000

## 2016-01-11 MED ORDER — SODIUM CHLORIDE 0.9 % IV BOLUS (SEPSIS): 1000.0000 mL | Freq: Once | INTRAVENOUS | Status: DC

## 2016-01-11 MED ORDER — SACCHAROMYCES BOULARDII 250 MG PO CAPS
250.0000 mg | ORAL_CAPSULE | Freq: Every morning | ORAL | Status: DC
  Administered 2016-01-12 – 2016-01-15 (×4): 250 mg via ORAL
  Filled 2016-01-11 (×4): qty 1

## 2016-01-11 MED ORDER — ASPIRIN 81 MG PO CHEW: 324.0000 mg | CHEWABLE_TABLET | Freq: Once | ORAL | Status: DC

## 2016-01-11 MED ORDER — RIVASTIGMINE 4.6 MG/24HR TD PT24
4.6000 mg | MEDICATED_PATCH | Freq: Every day | TRANSDERMAL | Status: DC
  Administered 2016-01-11 – 2016-01-15 (×5): 4.6 mg via TRANSDERMAL
  Filled 2016-01-11 (×5): qty 1

## 2016-01-11 MED ORDER — NITROGLYCERIN 0.4 MG SL SUBL
0.4000 mg | SUBLINGUAL_TABLET | SUBLINGUAL | Status: DC | PRN
  Administered 2016-01-11: 0.4 mg via SUBLINGUAL
  Filled 2016-01-11: qty 1

## 2016-01-11 MED ORDER — VANCOMYCIN HCL 500 MG IV SOLR
500.0000 mg | Freq: Two times a day (BID) | INTRAVENOUS | Status: DC
  Filled 2016-01-11: qty 500

## 2016-01-11 MED ORDER — MIRTAZAPINE 7.5 MG PO TABS
7.5000 mg | ORAL_TABLET | Freq: Every day | ORAL | Status: DC
  Administered 2016-01-11 – 2016-01-14 (×4): 7.5 mg via ORAL
  Filled 2016-01-11: qty 0.5
  Filled 2016-01-11 (×3): qty 1
  Filled 2016-01-11: qty 0.5

## 2016-01-11 MED ORDER — IPRATROPIUM BROMIDE 0.02 % IN SOLN
1.0000 mg | Freq: Once | RESPIRATORY_TRACT | Status: AC
  Administered 2016-01-11: 1 mg via RESPIRATORY_TRACT
  Filled 2016-01-11: qty 5

## 2016-01-11 MED ORDER — IPRATROPIUM BROMIDE 0.02 % IN SOLN: 0.5000 mg | Freq: Four times a day (QID) | RESPIRATORY_TRACT | Status: DC | PRN

## 2016-01-11 MED ORDER — DILTIAZEM HCL 30 MG PO TABS
30.0000 mg | ORAL_TABLET | Freq: Four times a day (QID) | ORAL | Status: DC
  Administered 2016-01-11 – 2016-01-13 (×6): 30 mg via ORAL
  Filled 2016-01-11 (×6): qty 1

## 2016-01-11 MED ORDER — ISOSORBIDE MONONITRATE ER 30 MG PO TB24
30.0000 mg | ORAL_TABLET | Freq: Every day | ORAL | Status: DC
  Administered 2016-01-11 – 2016-01-14 (×4): 30 mg via ORAL
  Filled 2016-01-11 (×4): qty 1

## 2016-01-11 MED ORDER — IOPAMIDOL (ISOVUE-370) INJECTION 76%
INTRAVENOUS | Status: AC
  Administered 2016-01-11: 100 mL
  Filled 2016-01-11: qty 100

## 2016-01-11 MED ORDER — POTASSIUM CHLORIDE ER 10 MEQ PO TBCR
20.0000 meq | EXTENDED_RELEASE_TABLET | Freq: Every day | ORAL | Status: DC
  Administered 2016-01-11 – 2016-01-15 (×5): 20 meq via ORAL
  Filled 2016-01-11 (×10): qty 2

## 2016-01-11 MED ORDER — NITROGLYCERIN 0.4 MG SL SUBL: 0.4000 mg | SUBLINGUAL_TABLET | SUBLINGUAL | Status: DC | PRN

## 2016-01-11 MED ORDER — ALBUTEROL (5 MG/ML) CONTINUOUS INHALATION SOLN
10.0000 mg/h | INHALATION_SOLUTION | Freq: Once | RESPIRATORY_TRACT | Status: AC
  Administered 2016-01-11: 10 mg/h via RESPIRATORY_TRACT
  Filled 2016-01-11: qty 20

## 2016-01-11 NOTE — Consult Note (Signed)
Name: [name] MRN: [id number] DOB: Dec 29, 1932    ADMISSION DATE:  01/11/2016 CONSULTATION DATE:  01/11/16  REFERRING MD :  [name]  CHIEF COMPLAINT:  [name]   HISTORY OF PRESENT ILLNESS:  [name] is a 82 y.o. male with a PMH as outlined below including A.fib for which he is on Warfarin.  He presented to [facility] 8/10 with weakness x 1 week.  Wife states he has progressively gotten more weak over the past several days and that on day of presentation, he felt that his legs weren't able to hold him up.  He did not complain of SOB up until day of presentation when he felt he was going to fall.  He has not had any chest pain, fevers/chills/sweats, N/V/D, abd pain, myalgias, LE edema.  He has had decreased appetite over this same 1 week period.  In ED, CTA demonstrated left sided pulmonary embolism with some degree of right heart strain.  His lactic acid went from 3.6 to 7.  Due to his increase in lactate, PCCM was called in consultation.  Pt states he has never had prior VTE.  Wife does report that he had intestinal ischemia several years ago and required surgery where he had part of his small bowel removed.  No hx malignancy, hemoptysis, recent travel.  Wife does state that pt is very inactive on daily basis and does not do much during the day.  Of note, he has had subtherapeutic INR's ranging around 1.6 - 1.9.  PAST MEDICAL HISTORY :   has a past medical history of Anemia; ASD (atrial septal defect); BPH (benign prostatic hyperplasia); Cerebrovascular accident [facility]) (1990); Chronic bronchitis ([place]); Chronic diarrhea; COPD (chronic obstructive pulmonary disease) ([place]); Coronary artery disease; Depression; Dysphagia, unspecified(787.20); Erosive gastritis; Esophagitis; Gastric polyp; Gastric ulcer; GERD (gastroesophageal reflux disease); Hearing impairment; Hiatal hernia; History of upper gastrointestinal hemorrhage; Hyperlipidemia; Hypertension; Internal hemorrhoids; LV dysfunction;  Permanent atrial fibrillation ([place]); and Vision problems.  has a past surgical history that includes Cholecystectomy (1990s); ASD repair (1983); Inguinal hernia repair (1963); partial small bowel resection (2005); enteroscopy (05/13/2012); Cardioversion; and left heart catheterization with coronary angiogram (N/A, 09/06/2014). Prior to Admission medications   Medication Sig Start Date End Date Taking? Authorizing Provider  albuterol (PROVENTIL HFA;VENTOLIN HFA) 108 (90 BASE) MCG/ACT inhaler Inhale 2 puffs into the lungs every 6 (six) hours as needed for wheezing or shortness of breath. 04/27/14  Yes [name], MD  dutasteride (AVODART) 0.5 MG capsule TAKE (1) CAPSULE DAILY 12/06/15  Yes [name], MD  furosemide (LASIX) 20 MG tablet TAKE ONE TABLET TWICE A DAY AS NEEDED FOR EDEMA 12/26/14  Yes [name], MD  ipratropium (ATROVENT) 0.02 % nebulizer solution NEBULIZE 1 VAIL 4 TIMES A DAY AS DIRECTED 03/21/15  Yes Tanda Rockers, MD  isosorbide mononitrate (IMDUR) 30 MG 24 hr tablet Take 1 tablet (30 mg total) by mouth daily. Patient taking differently: Take 30 mg by mouth at bedtime (night) 03/30/15  Yes Thompson Grayer, MD  levalbuterol (XOPENEX) 0.63 MG/3ML nebulizer solution NEBULIZE 1 VIAL (WITH IPRATROPIUM) TWICE A DAY AS NEEDED. MAY USE EXTR A EVERY 4 HOURS IF NEEDED Patient taking differently: Nebulize 1 vial with Duoneb two times a day as needed and may use an extra ampule every four hours if needed 06/20/14  Yes [name], MD  mirtazapine (REMERON) 15 MG tablet TAKE 1 TABLET DAILY. Patient taking differently: Take 7.5 mg at bedtime 03/30/15  Yes Bruce  Dan Europe, MD  mupirocin ointment (BACTROBAN) 2 % Apply 1 application topically 2 (two) times daily.   Yes Historical Provider, MD  nitroGLYCERIN (NITROSTAT) 0.4 MG SL tablet Place 1 tablet (0.4 mg total) under the tongue every 5 (five) minutes x 3 doses as needed for chest pain. 08/20/14  Yes Rogelia Mire, NP   potassium chloride (K-DUR) 10 MEQ tablet TAKE (2) TABLETS TWICE DAILY. Patient taking differently: Take 2 tablets (20 mEq) by mouth two times a day 11/16/15  Yes [name], MD  rivastigmine (EXELON) 4.6 mg/24hr PLACE 1 PATCH ONTO SKIN DAILY AS DIRECTED 12/25/15  Yes [name], MD  saccharomyces boulardii (FLORASTOR) 250 MG capsule Take 250 mg by mouth every morning.    Yes Historical Provider, MD  simvastatin (ZOCOR) 20 MG tablet TAKE ONE TABLET AT BEDTIME 07/03/15  Yes [name], MD  vitamin B-12 (CYANOCOBALAMIN) 500 MCG tablet Take 500 mcg by mouth daily.   Yes Historical Provider, MD  warfarin (COUMADIN) 1 MG tablet TAKES 2MG  ON MON, WED AND FRI TAKES 3MG  ALL OTHER DAYS Patient taking differently: Take 2 mg by mouth every evening on Sun/Tues/Thurs and 3 mg on Mon/Wed/Fri/Sat 09/18/15  Yes [name], MD  gabapentin (NEURONTIN) 600 MG tablet TAKE  (1)  TABLET  THREE TIMES DAILY. Patient not taking: Reported on 01/11/2016 10/05/15   [name], MD   Allergies  Allergen Reactions  . Tape Other (See Comments)    SKIN WILL TEAR IF TAPED (MUST BE LIGHT & BRIEF OR USE COBAN WRAP, PLEASE!!)  . Penicillins Hives    Has patient had a PCN reaction causing immediate rash, facial/tongue/throat swelling, SOB or lightheadedness with hypotension: Yes Has patient had a PCN reaction causing severe rash involving mucus membranes or skin necrosis: No Has patient had a PCN reaction that required hospitalization: Unknown Has patient had a PCN reaction occurring within the last 10 years: No If all of the above answers are "NO", then may proceed with Cephalosporin use.     FAMILY HISTORY:  family history includes Heart disease in his father and mother; Prostate cancer in his brother. SOCIAL HISTORY:  reports that he quit smoking about 32 years ago. His smoking use included Cigarettes. He smoked 0.00 packs per day for 0.00 years. He has never used smokeless tobacco. He reports  that he does not drink alcohol or use drugs.  REVIEW OF SYSTEMS:   All negative; except for those that are bolded, which indicate positives.  Constitutional: weight loss, weight gain, night sweats, fevers, chills, fatigue, weakness.  HEENT: headaches, sore throat, sneezing, nasal congestion, post nasal drip, difficulty swallowing, tooth/dental problems, visual complaints, visual changes, ear aches. Neuro: difficulty with speech, weakness, numbness, ataxia. CV:  chest pain, orthopnea, PND, swelling in lower extremities, dizziness, palpitations, syncope.  Resp: cough, hemoptysis, dyspnea, wheezing. GI: heartburn, indigestion, abdominal pain, nausea, vomiting, diarrhea, constipation, change in bowel habits, loss of appetite, hematemesis, melena, hematochezia.  GU: dysuria, change in color of urine, urgency or frequency, flank pain, hematuria. MSK: joint pain or swelling, decreased range of motion. Psych: change in mood or affect, depression, anxiety, suicidal ideations, homicidal ideations. Skin: rash, itching, bruising.    SUBJECTIVE:  Currently denies any chest pain or SOB.  Feels comfortable at rest.  VITAL SIGNS: Temp:  [98.3 F (36.8 C)-98.4 F (36.9 C)] 98.4 F (36.9 C) (08/10 2048) Pulse Rate:  [75-121] 84 (08/10 2048) Resp:  [15-28] 22 (08/10 2048) BP: (138-201)/(55-112) 145/60 (08/10 2048)  SpO2:  [94 %-100 %] 98 % (08/10 2048) FiO2 (%):  [28 %] 28 % (08/10 1457) Weight:  [150 lb (68 kg)] 150 lb (68 kg) (08/10 1429)  PHYSICAL EXAMINATION: General: elderly chronically ill appearing male, resting in bed, in NAD. Neuro: A&O x 3, non-focal.  HEENT: [place]/AT. PERRL, sclerae anicteric. Cardiovascular: IRIR, no M/R/G.  Lungs: Respirations mildly labored with occasional paradoxical respirations.  CTA bilaterally, No W/R/R.  Abdomen: BS x 4, soft, NT/ND.  Musculoskeletal: No gross deformities, no edema.  Skin: Intact, warm, no rashes.     Recent Labs Lab 01/11/16 1608  01/11/16 1613  NA 141 144  K 3.0* 3.1*  CL 104 102  CO2 27  --   BUN 11 13  CREATININE 1.10 1.00  GLUCOSE 161* 155*    Recent Labs Lab 01/11/16 1608 01/11/16 1613  HGB 14.3 16.0  HCT 45.8 47.0  WBC 4.1  --   PLT 139*  --    Ct Angio Chest Pe W Or Wo Contrast  Result Date: 01/11/2016 CLINICAL DATA:  Pt having SOB after walking from car to his house today. Pt denies chest pain EXAM: CT ANGIOGRAPHY CHEST WITH CONTRAST TECHNIQUE: Multidetector CT imaging of the chest was performed using the standard protocol during bolus administration of intravenous contrast. Multiplanar CT image reconstructions and MIPs were obtained to evaluate the vascular anatomy. CONTRAST:  100 mL of Isovue 370 intravenous contrast COMPARISON:  Current chest radiograph FINDINGS: Angiographic study: There are eccentric pulmonary emboli on the left. Thrombus extends from the peripheral left main pulmonary artery into the origins of the upper and lower lobe segmental branches. Thrombus is nonocclusive. There is no convincing pulmonary embolus on the right. All the main pulmonary arteries are enlarged, right measuring 4.3 cm in diameter and left measuring 3.8 cm. Main pulmonary artery is also dilated but less prominent. Aorta is normal caliber. There is no dissection. Atherosclerotic calcifications are noted along the thoracic aorta and the branch vessels. There also moderate coronary artery calcifications. For Positive for acute PE with CT evidence of right heart strain (RV/LV Ratio = 1.1) consistent with at least submassive (intermediate risk) PE. The presence of right heart strain has been associated with an increased risk of morbidity and mortality. Please activate Code PE by paging [phone]. Neck base and axilla:  No mass or adenopathy. Mediastinum and hila: Heart is mildly enlarged with a prominence of the atria. No mediastinal or hilar masses or pathologically enlarged lymph nodes. Lungs and pleura: There is  atelectasis with volume loss in the left lower lobe. Milder areas of dependent reticular opacity noted there also likely due to atelectasis. There changes of emphysema. No evidence of pneumonia or edema. No mass or suspicious nodule. No pleural effusion or pneumothorax. Limited upper abdomen: Moderate to large hiatal hernia. Probable fatty infiltration of the liver. Liver shows morphologic changes that raise suspicion for cirrhosis. No acute findings. Musculoskeletal: Status post median sternotomy for CABG surgery. There are degenerative changes of the visualized spine. No osteoblastic or osteolytic lesions. Review of the MIP images confirms the above findings. IMPRESSION: 1. Left-sided pulmonary emboli, nonocclusive. RV LV ratio suggest right heart strain. 2. The partial left lower lobe atelectasis. Emphysema. No evidence of pneumonia or pulmonary edema. 3. Enlarged pulmonary arteries consistent with pulmonary hypertension. Critical Value/emergent results were called by telephone at the time of interpretation on 01/11/2016 at 6:15 pm to Dr. Ellender Hose, who verbally acknowledged these results. Electronically Signed   By: Dedra Skeens.D.  On: 01/11/2016 18:16   Dg Chest Portable 1 View  Result Date: 01/11/2016 CLINICAL DATA:  Respiratory distress, shortness of Breath EXAM: PORTABLE CHEST 1 VIEW COMPARISON:  01/27/2015 FINDINGS: Cardiomegaly again noted. Status post median sternotomy. Prominent size pulmonary artery bilaterally again noted suspicious for chronic portal hypertension. Large hiatal hernia again noted. Mild hyperinflation. No infiltrate or pulmonary edema. IMPRESSION: No infiltrate or pulmonary edema. Stable prominent central pulmonary artery bilaterally. Large hiatal hernia again noted. Electronically Signed   By: Lahoma Crocker M.D.   On: 01/11/2016 15:21    STUDIES:  CTA chest 8/10 > left sided PE, non-oclussive. Echo 8/11 > LE duplex 8/11 >  SIGNIFICANT EVENTS  8/10 > admitted with  PE.  ASSESSMENT / PLAN:  Acute pulmonary embolism - of note, pt on warfarin for A.fib; however, has had mildly subtherapeutic INR's (1.6 to 1.9); therefore, can not truly define this as warfarin failure. Acute hypoxic respiratory failure - due to above. Plan: No role for systemic or catheter directed lysis at this point. Continue heparin gtt. Ensure adequate dosing of warfarin prior to discharge, likely needs increase from prior dosing. F/u on echo, LE duplex. Assess antiphospholipid antibody, lupus anticoagulant. Continue supplemental O2 as needed to maintain SpO2 > 92%.  COPD - PFT's from 2014 with FEV1/FVC 53%. Plan: Continue BD's. Pulmonary hygiene.  Elevated lactate - due to PE + work of breathing. Plan: Trend lactate.  Rest per primary team.   Montey Hora, [place] [facility] [place] [facility] Pager: [phone]  or [phone] 01/11/2016, 8:53 PM

## 2016-01-11 NOTE — ED Triage Notes (Signed)
Per EMS pt from home was walking from car to house and when he got to his house was extremely short of breath. Upon EMS arrival patient sitting very winded and dyspenic, RR 28. Patient has audible wheezing throughout bilateral lungs more prominent in lower lobes. Pt. SpO2 sats 97%/RA when patient was dyspenic. EMD administered 2.5 mg of albuterol neb treatment- with some improvement. 125mg  of IV solumederol. Patient currently has decreased work of breathing than at house.  Patient has hx of COPD, CHG & CVA with right sided deficits and facial droop.

## 2016-01-11 NOTE — Progress Notes (Addendum)
Shift note: Brief HPI: [name] is an 82 yo male who presented to ED today with SOB and found to be in AF with RVR. EMS responded to his home, gave nebs and brought to ED. No hypoxia. [name] has a hx of Afib rate controlled without rate control meds at home. On coumadin and INR at admission was 1.95.   In ED, CT chest showed submassive/intermediate risk Left PE with right heart strain, atelectasis, and no PNA. Around 7pm, Dr. Wyvonnia Dusky, [place], called this NP to report on pt and recent LA of over 7 (first one 3.6).  No code sepsis called in ED due to no identifiable source and infection. However, pt was started on empiric abx. Blood cx x 2 obtained. Hgb 16, WBCC 4.1. Procalcitonin normal. Likely cause of elevated lactate is the pt's newly found Left PE.    Pt sees cardiology for chronic AF. Last visit 10/16. No changes made on that visit. Last echo was in 2015 showing EF 0000000, and no systolic or diastolic dysfunction.   NP called and spoke to pt's RN. Per RN, pt looks weak. Vitals are OK with pressure in ths 140-150 range. HR now in 80-90 range (on cardizem drip at 5), RR and O2 sat normal. Per RN, she has 3 PIVs on pt and is trying to run Heparin, Cardizem, Vanc, and boluses. Pt has a room in SDU and will go upstairs hopefully within 30 mins or so.  Given controlled rate at this time, told RN she could pause cardizem drip and give a 30mg  po dose of Cardizem so she can give boluses and continue Heparin and Vanc.  NP spoke with PCCM about pt. Info/hx shared with Dr. Oletta Darter who will send someone to see the pt.  Dr. Oletta Darter advised only 2L IVF (which is running).   Will follow. Pt is a DNR.  Clance Boll, NP [facility]  Update: Seen by PCCM. Pt OK to stay in SDU. Keep NPO except sips/chips til am and advancement of diet per attending in am.  KJKG, NP [facility]  Update: Pt's LA rising and trop is .49. Abnormal labs attributed to PE and demand ischemia. However, pt is having no active CP,  resting quietly. No respiratory distress. BP in the 140-150 range. HR 80s off the Cardizem drip. Has received the first dose of po Cardizem. Continue to cycle enzymes and observe closely in SDU.  KJKG, NP [facility] Update: troponin and LA trending downward.  KJKG, NP

## 2016-01-11 NOTE — Progress Notes (Signed)
ANTICOAGULATION CONSULT NOTE - Initial Consult  Pharmacy Consult for Heparin Indication: pulmonary embolus  Allergies  Allergen Reactions  . Tape Other (See Comments)    SKIN WILL TEAR IF TAPED (MUST BE LIGHT & BRIEF OR USE COBAN WRAP, PLEASE!!)  . Penicillins Hives    Has patient had a PCN reaction causing immediate rash, facial/tongue/throat swelling, SOB or lightheadedness with hypotension: Yes Has patient had a PCN reaction causing severe rash involving mucus membranes or skin necrosis: No Has patient had a PCN reaction that required hospitalization: Unknown Has patient had a PCN reaction occurring within the last 10 years: No If all of the above answers are "NO", then may proceed with Cephalosporin use.     Patient Measurements: Height: 5\' 11"  (180.3 cm) Weight: 150 lb (68 kg) IBW/kg (Calculated) : 75.3 Heparin Dosing Weight: 68 kg  Vital Signs: Temp: 98.3 F (36.8 C) (08/10 1433) Temp Source: Oral (08/10 1433) BP: 159/60 (08/10 1815) Pulse Rate: 99 (08/10 1815)  Labs:  Recent Labs  01/10/16 01/11/16 1608 01/11/16 1613  HGB  --  14.3 16.0  HCT  --  45.8 47.0  PLT  --  139*  --   LABPROT  --  22.5*  --   INR 2.0 1.95  --   CREATININE  --  1.10 1.00    Estimated Creatinine Clearance: 55.7 mL/min (by C-G formula based on SCr of 1 mg/dL).   Medical History: Past Medical History:  Diagnosis Date  . Anemia   . ASD (atrial septal defect)    a. s/p repair 1983.  [name] BPH (benign prostatic hyperplasia)   . Cerebrovascular accident ([place]) [place]  . Chronic bronchitis ([place])    a. h/o resp failure in setting of bronchitis vs PNA 05/2012.  [name] Chronic diarrhea   . COPD (chronic obstructive pulmonary disease) ([place])   . Coronary artery disease    a. Nonobstructive by cath 2009 and 09/06/14  . Depression   . Dysphagia, unspecified(787.20)   . Erosive gastritis    H/o GIB felt secondary to this  . Esophagitis   . Gastric polyp   . Gastric ulcer   . GERD  (gastroesophageal reflux disease)   . Hearing impairment   . Hiatal hernia   . History of upper gastrointestinal hemorrhage    dr. Maurene Capes, GI  . Hyperlipidemia   . Hypertension   . Internal hemorrhoids   . LV dysfunction    a. EF 45-50% by echo 05/2012.  [name] Permanent atrial fibrillation ([facility])    INR followed at [facility].  [name] Vision problems     Assessment: 82 yo M with Coumadin PTA for AFib presents w/ SOB/tachypneic. CT shows L-sided PE, non-occlusive. INR on admit is 1.95, H/H wnl, SCr 1.10. Recent INRs in clinic slightly subtherapeutic to therapeutic at 1.8-2.0, warfarin held acutely, rx consulted to start heparin. Will bolus due to severity of PE with R-heart strain noted as well.   **PTA Coumadin dose: 2 mg T/Th/Sun, 3 mg M/W/F/Sat  Goal of Therapy:  Heparin level 0.3-0.7 units/ml Monitor platelets by anticoagulation protocol: Yes   Plan:  Give 4000 units bolus x 1 Start heparin infusion at 1150 units/hr Check anti-Xa level in 8 hours and daily while on heparin Continue to monitor H&H and platelets  Arrie Senate, PharmD PGY-1 Pharmacy Resident Pager: [phone] 01/11/2016

## 2016-01-11 NOTE — Progress Notes (Signed)
Pharmacy Antibiotic Note  [name] is a 82 y.o. male admitted on 01/11/2016 with sepsis.  Pharmacy has been consulted for Vanc/Zosyn dosing. WBC 4.1, afeb, CrCl ~55.  Plan: -Vanc 1500 mg IV x1 -Vanc 500 mg q12h -Cefepime 2g q8h -Monitor clinical status, SCr, CBC, LOT, VT as needed -F/U Cx data  Height: 5\' 11"  (180.3 cm) Weight: 150 lb (68 kg) IBW/kg (Calculated) : 75.3  Temp (24hrs), Avg:98.3 F (36.8 C), Min:98.3 F (36.8 C), Max:98.3 F (36.8 C)   Recent Labs Lab 01/11/16 1608 01/11/16 1613 01/11/16 1615  WBC 4.1  --   --   CREATININE  --  1.00  --   LATICACIDVEN  --   --  3.64*    Estimated Creatinine Clearance: 55.7 mL/min (by C-G formula based on SCr of 1 mg/dL).    Allergies  Allergen Reactions  . Penicillins Hives    Antimicrobials this admission: 8/10 Cefepime >>  8/10 Vancomycin >>   Dose adjustments this admission: n/a  Microbiology results: 8/10 BCx: IP  Thank you for allowing pharmacy to be a part of this patient's care.  Arrie Senate, PharmD PGY-1 Pharmacy Resident Pager: [phone] 01/11/2016

## 2016-01-11 NOTE — ED Provider Notes (Signed)
[facility] DEPT Provider Note   CSN: [id number] Arrival date & time: 01/11/16  1419  First Provider Contact:  None       History   Chief Complaint Chief Complaint  Patient presents with  . Shortness of Breath    HPI [name] is a 82 y.o. male.  HPI 82 year old male with extensive past medical history including chronic COPD, A. fib on Coumadin, history of stroke who presents with acute onset of shortness of breath. The patient has felt generally unwell for approximately 1 day. He was walking in his yard earlier today when he experienced acute onset of severe shortness of breath. Denies any associated chest pain, although he did have some mild chest pressure at the onset of symptoms. He called EMS. On EMS arrival, patient was found to be acutely wheezing with increased work of breathing. He was given a breathing treatment with improvement in his symptoms. He is noted to be intermittently and RVR and route. Currently he endorses severe shortness of breath and mild pressure in his chest. Denies any fevers. Denies any cough or sputum production beyond his baseline.  Past Medical History:  Diagnosis Date  . Anemia   . ASD (atrial septal defect)    a. s/p repair 1983.  [name] BPH (benign prostatic hyperplasia)   . Cerebrovascular accident ([place]) [place]  . Chronic bronchitis ([place])    a. h/o resp failure in setting of bronchitis vs PNA 05/2012.  [name] Chronic diarrhea   . COPD (chronic obstructive pulmonary disease) ([place])   . Coronary artery disease    a. Nonobstructive by cath 2009 and 09/06/14  . Depression   . Dysphagia, unspecified(787.20)   . Erosive gastritis    H/o GIB felt secondary to this  . Esophagitis   . Gastric polyp   . Gastric ulcer   . GERD (gastroesophageal reflux disease)   . Hearing impairment   . Hiatal hernia   . History of upper gastrointestinal hemorrhage    dr. Maurene Capes, GI  . Hyperlipidemia   . Hypertension   . Internal hemorrhoids   . LV dysfunction    a.  EF 45-50% by echo 05/2012.  [name] Permanent atrial fibrillation (HCC)    INR followed at [facility].  [name] Vision problems     Patient Active Problem List   Diagnosis Date Noted  . Pulmonary emboli ([place]) 01/11/2016  . Actinic keratoses 11/16/2015  . Memory loss 11/16/2015  . Atypical chest pain 01/28/2015  . Right carotid bruit 09/26/2014  . Abnormal stress test   . Chronic atrial fibrillation ([place]) 09/04/2014  . On warfarin therapy 09/04/2014  . Encounter for therapeutic drug monitoring 07/26/2013  . Bradycardia 02/17/2013  . Acute on chronic systolic CHF (congestive heart failure) ([place]) 11/16/2012  . Acute on chronic renal failure ([place]) 11/16/2012  . Failure to thrive in adult 10/30/2012  . Anemia of chronic disease 10/13/2012  . Toxic encephalopathy secondary to UTI 10/12/2012  . Depression 10/12/2012  . Moderate protein-calorie malnutrition ([place]) 10/12/2012  . Stage III chronic kidney disease 10/12/2012  . CHF (congestive heart failure) ([place]) 09/06/2012  . Acute diastolic CHF (congestive heart failure) ([place]) 07/23/2012  . Hypokalemia 07/20/2012  . Hypomagnesemia 07/20/2012  . Hypocalcemia 07/20/2012  . Acute kidney injury ([place]) 07/20/2012  . Chronic diarrhea 07/20/2012  . Falls frequently 07/20/2012  . Leukocytosis 07/20/2012  . Normocytic anemia 07/20/2012  . Chronic respiratory failure ([place]) 05/23/2012  . COPD exacerbation ([place]) 05/22/2012  . Gastritis with hemorrhage 05/13/2012  .  Stricture and stenosis of esophagus 05/13/2012  . Acute posthemorrhagic anemia 05/12/2012  . Atrial fibrillation with rapid ventricular response ([place]) 05/06/2012  . Dysphagia 05/06/2012  . HTN (hypertension) 05/06/2012  . H/O: CVA (cerebrovascular accident) 05/06/2012  . GERD (gastroesophageal reflux disease) 05/06/2012  . Purulent bronchitis ([place]) 05/06/2012  . SOB (shortness of breath) 03/19/2010  . ABDOMINAL DISTENSION 03/19/2010  . FATIGUE 11/09/2009  . B12 DEFICIENCY 07/26/2009  .  Hyperglycemia 07/26/2009  . Hyperlipidemia 07/20/2009  . PERIPHERAL NEUROPATHY 07/20/2009  . BENIGN PROSTATIC HYPERTROPHY, HX OF 07/20/2009  . Essential hypertension 09/05/2007  . Coronary atherosclerosis 09/05/2007  . FIBRILLATION, ATRIAL 09/05/2007  . History of CEREBROVASCULAR ACCIDENT 09/05/2007  . ESOPHAGITIS 09/05/2007  . GERD 09/05/2007  . EROSIVE GASTRITIS 09/05/2007  . UPPER GASTROINTESTINAL HEMORRHAGE 09/05/2007  . DYSPHAGIA UNSPECIFIED 09/05/2007  . INTERNAL HEMORRHOIDS 06/10/2003  . GASTRIC POLYP 12/03/2001  . HIATAL HERNIA 12/03/2001  . COLONIC POLYPS 09/25/1992  . GASTRIC ULCER 09/22/1992    Past Surgical History:  Procedure Laterality Date  . ASD REPAIR  1983  . CARDIOVERSION     possibly 3 times, dr. brody/cooper  . CHOLECYSTECTOMY  1990s  . ENTEROSCOPY  05/13/2012   Procedure: ENTEROSCOPY;  Surgeon: Inda Castle, MD;  Location: [facility] ENDOSCOPY;  Service: Endoscopy;  Laterality: N/A;  . [place]  . LEFT HEART CATHETERIZATION WITH CORONARY ANGIOGRAM N/A 09/06/2014   Procedure: LEFT HEART CATHETERIZATION WITH CORONARY ANGIOGRAM;  Surgeon: Jettie Booze, MD;  Location: [facility] C[facility] LAB;  Service: Cardiovascular;  Laterality: N/A;  . partial small bowel resection  2005   ischemic?       Home Medications    Prior to Admission medications   Medication Sig Start Date End Date Taking? Authorizing Provider  albuterol (PROVENTIL HFA;VENTOLIN HFA) 108 (90 BASE) MCG/ACT inhaler Inhale 2 puffs into the lungs every 6 (six) hours as needed for wheezing or shortness of breath. 04/27/14  Yes [name], MD  dutasteride (AVODART) 0.5 MG capsule TAKE (1) CAPSULE DAILY 12/06/15  Yes [name], MD  furosemide (LASIX) 20 MG tablet TAKE ONE TABLET TWICE A DAY AS NEEDED FOR EDEMA 12/26/14  Yes [name], MD  ipratropium (ATROVENT) 0.02 % nebulizer solution NEBULIZE 1 VAIL 4 TIMES A DAY AS DIRECTED 03/21/15  Yes Tanda Rockers, MD    isosorbide mononitrate (IMDUR) 30 MG 24 hr tablet Take 1 tablet (30 mg total) by mouth daily. Patient taking differently: Take 30 mg by mouth at bedtime (night) 03/30/15  Yes Thompson Grayer, MD  levalbuterol (XOPENEX) 0.63 MG/3ML nebulizer solution NEBULIZE 1 VIAL (WITH IPRATROPIUM) TWICE A DAY AS NEEDED. MAY USE EXTR A EVERY 4 HOURS IF NEEDED Patient taking differently: Nebulize 1 vial with Duoneb two times a day as needed and may use an extra ampule every four hours if needed 06/20/14  Yes [name], MD  mirtazapine (REMERON) 15 MG tablet TAKE 1 TABLET DAILY. Patient taking differently: Take 7.5 mg at bedtime 03/30/15  Yes [name], MD  mupirocin ointment (BACTROBAN) 2 % Apply 1 application topically 2 (two) times daily.   Yes Historical Provider, MD  nitroGLYCERIN (NITROSTAT) 0.4 MG SL tablet Place 1 tablet (0.4 mg total) under the tongue every 5 (five) minutes x 3 doses as needed for chest pain. 08/20/14  Yes Rogelia Mire, NP  potassium chloride (K-DUR) 10 MEQ tablet TAKE (2) TABLETS TWICE DAILY. Patient taking differently: Take 2 tablets (20 mEq) by mouth two times  a day 11/16/15  Yes [name], MD  rivastigmine (EXELON) 4.6 mg/24hr PLACE 1 PATCH ONTO SKIN DAILY AS DIRECTED 12/25/15  Yes [name], MD  saccharomyces boulardii (FLORASTOR) 250 MG capsule Take 250 mg by mouth every morning.    Yes Historical Provider, MD  simvastatin (ZOCOR) 20 MG tablet TAKE ONE TABLET AT BEDTIME 07/03/15  Yes [name], MD  vitamin B-12 (CYANOCOBALAMIN) 500 MCG tablet Take 500 mcg by mouth daily.   Yes Historical Provider, MD  warfarin (COUMADIN) 1 MG tablet TAKES 2MG  ON MON, WED AND FRI TAKES 3MG  ALL OTHER DAYS Patient taking differently: Take 2 mg by mouth every evening on Sun/Tues/Thurs and 3 mg on Mon/Wed/Fri/Sat 09/18/15  Yes [name], MD  gabapentin (NEURONTIN) 600 MG tablet TAKE  (1)  TABLET  THREE TIMES DAILY. Patient not taking: Reported on 01/11/2016  10/05/15   [name], MD    Family History Family History  Problem Relation Age of Onset  . Heart disease Mother   . Heart disease Father   . Prostate cancer Brother     Social History Social History  Substance Use Topics  . Smoking status: Former Smoker    Packs/day: 0.00    Years: 0.00    Types: Cigarettes    Quit date: 06/13/1983  . Smokeless tobacco: Never Used  . Alcohol use No     Allergies   Tape and Penicillins   Review of Systems Review of Systems  Constitutional: Positive for fatigue. Negative for chills and fever.  HENT: Negative for congestion and rhinorrhea.   Eyes: Negative for visual disturbance.  Respiratory: Positive for chest tightness and shortness of breath. Negative for cough and wheezing.   Cardiovascular: Positive for chest pain and leg swelling.  Gastrointestinal: Negative for abdominal pain, diarrhea, nausea and vomiting.  Genitourinary: Negative for dysuria and flank pain.  Musculoskeletal: Negative for neck pain and neck stiffness.  Skin: Negative for rash and wound.  Allergic/Immunologic: Negative for immunocompromised state.  Neurological: Negative for syncope, weakness and headaches.  All other systems reviewed and are negative.    Physical Exam Updated Vital Signs BP 134/72   Pulse 82   Temp 98.5 F (36.9 C) (Oral)   Resp 20   Ht 5\' 11"  (1.803 m)   Wt 151 lb 9.6 oz (68.8 kg)   SpO2 95%   BMI 21.14 kg/m   Physical Exam  Constitutional: He is oriented to person, place, and time. He appears well-developed and well-nourished. He appears distressed.  HENT:  Head: Normocephalic and atraumatic.  Mouth/Throat: Oropharynx is clear and moist.  Eyes: Conjunctivae are normal. Pupils are equal, round, and reactive to light.  Neck: Neck supple.  Cardiovascular: Normal rate, regular rhythm and normal heart sounds.  Exam reveals no friction rub.   No murmur heard. Pulmonary/Chest: Accessory muscle usage present. Tachypnea noted.  No respiratory distress. He has decreased breath sounds. He has wheezes in the right upper field, the right middle field, the right lower field, the left upper field, the left middle field and the left lower field. He has no rales.  Abdominal: He exhibits no distension. There is no tenderness.  Musculoskeletal: He exhibits no edema.  Neurological: He is alert and oriented to person, place, and time. He exhibits normal muscle tone.  Skin: Skin is warm. Capillary refill takes less than 2 seconds.  Nursing note and vitals reviewed.    ED Treatments / Results  Labs (all labs ordered are listed,  but only abnormal results are displayed) Labs Reviewed  CBC WITH DIFFERENTIAL/PLATELET - Abnormal; Notable for the following:       Result Value   RDW 16.4 (*)    Platelets 139 (*)    All other components within normal limits  COMPREHENSIVE METABOLIC PANEL - Abnormal; Notable for the following:    Potassium 3.0 (*)    Glucose, Bld 161 (*)    All other components within normal limits  PROTIME-INR - Abnormal; Notable for the following:    Prothrombin Time 22.5 (*)    All other components within normal limits  CBC - Abnormal; Notable for the following:    Hemoglobin 11.5 (*)    HCT 37.7 (*)    RDW 16.9 (*)    Platelets 119 (*)    All other components within normal limits  BASIC METABOLIC PANEL - Abnormal; Notable for the following:    CO2 18 (*)    Glucose, Bld 184 (*)    Calcium 8.2 (*)    All other components within normal limits  PSA - Abnormal; Notable for the following:    PSA 4.82 (*)    All other components within normal limits  LACTIC ACID, PLASMA - Abnormal; Notable for the following:    Lactic Acid, Venous 8.1 (*)    All other components within normal limits  LACTIC ACID, PLASMA - Abnormal; Notable for the following:    Lactic Acid, Venous 5.9 (*)    All other components within normal limits  TROPONIN I - Abnormal; Notable for the following:    Troponin I 0.49 (*)    All other  components within normal limits  TROPONIN I - Abnormal; Notable for the following:    Troponin I 0.46 (*)    All other components within normal limits  I-STAT CHEM 8, ED - Abnormal; Notable for the following:    Potassium 3.1 (*)    Glucose, Bld 155 (*)    All other components within normal limits  I-STAT CG4 LACTIC ACID, ED - Abnormal; Notable for the following:    Lactic Acid, Venous 3.64 (*)    All other components within normal limits  I-STAT VENOUS BLOOD GAS, ED - Abnormal; Notable for the following:    pCO2, Ven 53.6 (*)    Bicarbonate 25.5 (*)    All other components within normal limits  I-STAT CG4 LACTIC ACID, ED - Abnormal; Notable for the following:    Lactic Acid, Venous 7.27 (*)    All other components within normal limits  MRSA PCR SCREENING  CULTURE, BLOOD (ROUTINE X 2)  CULTURE, BLOOD (ROUTINE X 2)  URINE CULTURE  BRAIN NATRIURETIC PEPTIDE  PROCALCITONIN  MAGNESIUM  HEPARIN LEVEL (UNFRACTIONATED)  URINALYSIS, ROUTINE W REFLEX MICROSCOPIC (NOT AT [facility])  TROPONIN I  ANTIPHOSPHOLIPID SYNDROME EVAL, BLD  LUPUS ANTICOAGULANT PANEL  HEPARIN LEVEL (UNFRACTIONATED)  I-STAT TROPOININ, ED    EKG  EKG Interpretation  Date/Time:  Thursday January 11 2016 14:32:00 EDT Ventricular Rate:  85 PR Interval:    QRS Duration: 134 QT Interval:  408 QTC Calculation: 486 R Axis:   100 Text Interpretation:  Atrial fibrillation RBBB and LPFB No significant change since last tracing Confirmed by Joeanne Robicheaux MD, Lysbeth Galas [phone]) on 01/11/2016 4:23:08 PM       Radiology Ct Angio Chest Pe W Or Wo Contrast  Result Date: 01/11/2016 CLINICAL DATA:  Pt having SOB after walking from car to his house today. Pt denies chest pain EXAM: CT ANGIOGRAPHY CHEST WITH  CONTRAST TECHNIQUE: Multidetector CT imaging of the chest was performed using the standard protocol during bolus administration of intravenous contrast. Multiplanar CT image reconstructions and MIPs were obtained to evaluate the  vascular anatomy. CONTRAST:  100 mL of Isovue 370 intravenous contrast COMPARISON:  Current chest radiograph FINDINGS: Angiographic study: There are eccentric pulmonary emboli on the left. Thrombus extends from the peripheral left main pulmonary artery into the origins of the upper and lower lobe segmental branches. Thrombus is nonocclusive. There is no convincing pulmonary embolus on the right. All the main pulmonary arteries are enlarged, right measuring 4.3 cm in diameter and left measuring 3.8 cm. Main pulmonary artery is also dilated but less prominent. Aorta is normal caliber. There is no dissection. Atherosclerotic calcifications are noted along the thoracic aorta and the branch vessels. There also moderate coronary artery calcifications. For Positive for acute PE with CT evidence of right heart strain (RV/LV Ratio = 1.1) consistent with at least submassive (intermediate risk) PE. The presence of right heart strain has been associated with an increased risk of morbidity and mortality. Please activate Code PE by paging [phone]. Neck base and axilla:  No mass or adenopathy. Mediastinum and hila: Heart is mildly enlarged with a prominence of the atria. No mediastinal or hilar masses or pathologically enlarged lymph nodes. Lungs and pleura: There is atelectasis with volume loss in the left lower lobe. Milder areas of dependent reticular opacity noted there also likely due to atelectasis. There changes of emphysema. No evidence of pneumonia or edema. No mass or suspicious nodule. No pleural effusion or pneumothorax. Limited upper abdomen: Moderate to large hiatal hernia. Probable fatty infiltration of the liver. Liver shows morphologic changes that raise suspicion for cirrhosis. No acute findings. Musculoskeletal: Status post median sternotomy for CABG surgery. There are degenerative changes of the visualized spine. No osteoblastic or osteolytic lesions. Review of the MIP images confirms the above findings.  IMPRESSION: 1. Left-sided pulmonary emboli, nonocclusive. RV LV ratio suggest right heart strain. 2. The partial left lower lobe atelectasis. Emphysema. No evidence of pneumonia or pulmonary edema. 3. Enlarged pulmonary arteries consistent with pulmonary hypertension. Critical Value/emergent results were called by telephone at the time of interpretation on 01/11/2016 at 6:15 pm to Dr. Ellender Hose, who verbally acknowledged these results. Electronically Signed   By: Lajean Manes M.D.   On: 01/11/2016 18:16   Dg Chest Portable 1 View  Result Date: 01/11/2016 CLINICAL DATA:  Respiratory distress, shortness of Breath EXAM: PORTABLE CHEST 1 VIEW COMPARISON:  01/27/2015 FINDINGS: Cardiomegaly again noted. Status post median sternotomy. Prominent size pulmonary artery bilaterally again noted suspicious for chronic portal hypertension. Large hiatal hernia again noted. Mild hyperinflation. No infiltrate or pulmonary edema. IMPRESSION: No infiltrate or pulmonary edema. Stable prominent central pulmonary artery bilaterally. Large hiatal hernia again noted. Electronically Signed   By: Lahoma Crocker M.D.   On: 01/11/2016 15:21    Procedures .Critical Care Performed by: Duffy Bruce Authorized by: Duffy Bruce   Critical care provider statement:    Critical care time (minutes):  40   Critical care time was exclusive of:  Separately billable procedures and treating other patients   Critical care was necessary to treat or prevent imminent or life-threatening deterioration of the following conditions:  Cardiac failure, circulatory failure, respiratory failure and sepsis   Critical care was time spent personally by me on the following activities:  Development of treatment plan with patient or surrogate, discussions with consultants, discussions with primary provider, evaluation of patient's response to  treatment, examination of patient, obtaining history from patient or surrogate, ordering and performing treatments and  interventions, ordering and review of laboratory studies, ordering and review of radiographic studies, pulse oximetry, re-evaluation of patient's condition and review of old charts   I assumed direction of critical care for this patient from another provider in my specialty: no     (including critical care time)  Medications Ordered in ED Medications  nitroGLYCERIN (NITROSTAT) SL tablet 0.4 mg (0.4 mg Sublingual Given 01/11/16 1510)  dutasteride (AVODART) capsule 0.5 mg (not administered)  isosorbide mononitrate (IMDUR) 24 hr tablet 30 mg (30 mg Oral Given 01/11/16 2136)  mirtazapine (REMERON) tablet 7.5 mg (7.5 mg Oral Given 01/11/16 2136)  nitroGLYCERIN (NITROSTAT) SL tablet 0.4 mg (not administered)  potassium chloride (K-DUR) CR tablet 20 mEq (20 mEq Oral Given 01/11/16 2135)  rivastigmine (EXELON) 4.6 mg/24hr 4.6 mg (4.6 mg Transdermal Patch Applied 01/11/16 2134)  saccharomyces boulardii (FLORASTOR) capsule 250 mg (not administered)  simvastatin (ZOCOR) tablet 20 mg (20 mg Oral Given 01/11/16 2133)  sodium chloride flush (NS) 0.9 % injection 3 mL (3 mLs Intravenous Given 01/12/16 0349)  heparin ADULT infusion 100 units/mL (25000 units/276mL sodium chloride 0.45%) (1,150 Units/hr Intravenous New Bag/Given 01/11/16 1916)  ipratropium (ATROVENT) nebulizer solution 0.5 mg (not administered)  diltiazem (CARDIZEM) tablet 30 mg (30 mg Oral Given 01/12/16 0347)  vitamin B-12 (CYANOCOBALAMIN) tablet 500 mcg (not administered)  levalbuterol (XOPENEX) nebulizer solution 1.25 mg (not administered)  gabapentin (NEURONTIN) tablet 600 mg (600 mg Oral Given 01/12/16 0347)  albuterol (PROVENTIL) (2.5 MG/3ML) 0.083% nebulizer solution 5 mg (5 mg Nebulization Given 01/11/16 1440)  methylPREDNISolone sodium succinate (SOLU-MEDROL) 125 mg/2 mL injection 125 mg (125 mg Intravenous Given by Other 01/11/16 1445)  albuterol (PROVENTIL,VENTOLIN) solution continuous neb (10 mg/hr Nebulization Given 01/11/16 1452)    ipratropium (ATROVENT) nebulizer solution 1 mg (1 mg Nebulization Given 01/11/16 1452)  aspirin chewable tablet 324 mg (324 mg Oral Given 01/11/16 1523)  diltiazem (CARDIZEM) 1 mg/mL load via infusion 10 mg (10 mg Intravenous Bolus from Bag 01/11/16 1702)  sodium chloride 0.9 % bolus 1,000 mL (1,000 mLs Intravenous New Bag/Given 01/11/16 1637)  vancomycin (VANCOCIN) 1,500 mg in sodium chloride 0.9 % 500 mL IVPB (1,500 mg Intravenous New Bag/Given 01/11/16 1820)  potassium chloride SA (K-DUR,KLOR-CON) CR tablet 40 mEq (40 mEq Oral Given 01/11/16 1806)  sodium chloride 0.9 % bolus 1,000 mL (0 mLs Intravenous Stopped 01/11/16 2032)  iopamidol (ISOVUE-370) 76 % injection (100 mLs  Contrast Given 01/11/16 1747)  heparin bolus via infusion 4,000 Units (4,000 Units Intravenous Bolus from Bag 01/11/16 1918)     Initial Impression / Assessment and Plan / ED Course  I have reviewed the triage vital signs and the nursing notes.  Pertinent labs & imaging results that were available during my care of the patient were reviewed by me and considered in my medical decision making (see chart for details).  Clinical Course    82 yo M with PMHx of COPD, HTN, HLD, AFib on coumadin who p/w a 1-day history of fatigue with acute onset of SOB this afternoon. On arrival, pt in moderate respiratory distress, with tachypnea, diffuse wheezing, and intermittent AFib with RVR. DDx includes COPD exacerbation, ACS, symptomatic AFib RVR, must also consider PE. Will check broad labs, imaging. Breathing tx and steroids ordered. Will hold on fluids given ? H/o CHF, though last EF was normal per review of echo in 2016.  Labs, imaging as above. CBC shows normal WBC  which is reassuring. However, LA is elevated at 3.8 - unclear whether this is 2/2 hypoperfusion from primary cardiac source versus sepsis, as pt otherwise denies infectious sx. Will start broad-spectrum abx given sepsis criteria, LA, and check procalcitonin. Otherwise, labs show  hypoK - will replete. VBG shows mild resp acidosis - breathing tx given. BNP normal so 2L NS given for sepsis. Dilt bolus and gtt given for AFib RVR with improvement in rate, symptoms. Suspect symptomatic AFib versus AFib RVR 2/2 underlying sepsis. Will admit to step down.  Of note, CTA PE shows +PE. INR therapeutic but has been subtherapeutic previously. Possible RH strain on CT but trop, BNP normal. Likely chronic. Hospitalist aware. Admitted to step down. Rate controlled on Dilt bolus and drip.  Final Clinical Impressions(s) / ED Diagnoses   Final diagnoses:  SOB (shortness of breath)  COPD exacerbation (HCC)  Atrial fibrillation with rapid ventricular response (HCC)  Other acute pulmonary embolism without acute cor pulmonale [facility])    New Prescriptions Current Discharge Medication List       Duffy Bruce, MD 01/12/16 0502

## 2016-01-11 NOTE — H&P (Addendum)
History and Physical  [name] DOB: 1933-01-31 DOA: 01/11/2016  Referring physician: EDP PCP: [name], MD   Chief Complaint: acutely sob   HPI: [name] is a 82 y.o. male  With h/o afib on coumadin, h/o cva with mild residual right sided weakness, h/o copd presented to [place] ED due to above complaints,  today he became extremely short of breath when he was walking  From car to his house. EMS called, he was found tachypneic with rr 28, though not hypoxic, he was given nebs and is sent to ED.  ED course: he is in afib/rvr upon arrival, bp elevated, no hypoxia, cbc unremarkable, bmp with k3.1, lactic acid 3.64, troponin negative. CTA + left sided PE with right heart strain, he was given nebs, steroids, vanc/ cefepime, hospitalist called to admit the patient.  Patient otherwise denies fever, no cough, no chest pain, no lower extremity edema, no  n/v, no constipation, no diarrhea, he did report some weight loss which he attribute to poor appetite.    Review of Systems:  Detail per HPI, Review of systems are otherwise negative  Past Medical History:  Diagnosis Date  . Anemia   . ASD (atrial septal defect)    a. s/p repair 1983.  [name] BPH (benign prostatic hyperplasia)   . Cerebrovascular accident ([place]) [place]  . Chronic bronchitis ([place])    a. h/o resp failure in setting of bronchitis vs PNA 05/2012.  [name] Chronic diarrhea   . COPD (chronic obstructive pulmonary disease) ([place])   . Coronary artery disease    a. Nonobstructive by cath 2009 and 09/06/14  . Depression   . Dysphagia, unspecified(787.20)   . Erosive gastritis    H/o GIB felt secondary to this  . Esophagitis   . Gastric polyp   . Gastric ulcer   . GERD (gastroesophageal reflux disease)   . Hearing impairment   . Hiatal hernia   . History of upper gastrointestinal hemorrhage    dr. Maurene Capes, GI  . Hyperlipidemia   . Hypertension   . Internal hemorrhoids   . LV dysfunction    a. EF 45-50%  by echo 05/2012.  [name] Permanent atrial fibrillation (HCC)    INR followed at [facility].  [name] Vision problems    Past Surgical History:  Procedure Laterality Date  . ASD REPAIR  1983  . CARDIOVERSION     possibly 3 times, dr. brody/cooper  . CHOLECYSTECTOMY  1990s  . ENTEROSCOPY  05/13/2012   Procedure: ENTEROSCOPY;  Surgeon: Inda Castle, MD;  Location: [facility] ENDOSCOPY;  Service: Endoscopy;  Laterality: N/A;  . [place]  . LEFT HEART CATHETERIZATION WITH CORONARY ANGIOGRAM N/A 09/06/2014   Procedure: LEFT HEART CATHETERIZATION WITH CORONARY ANGIOGRAM;  Surgeon: Jettie Booze, MD;  Location: [facility] CATH LAB;  Service: Cardiovascular;  Laterality: N/A;  . partial small bowel resection  2005   ischemic?   Social History:  reports that he quit smoking about 32 years ago. His smoking use included Cigarettes. He smoked 0.00 packs per day for 0.00 years. He has never used smokeless tobacco. He reports that he does not drink alcohol or use drugs. Patient lives at home & is able to participate in activities of daily living independently , but does has a walker and wheelchair, but he does not use them most of the times  Allergies  Allergen Reactions  . Penicillins Hives    Family History  Problem Relation Age of Onset  .  Heart disease Mother   . Heart disease Father   . Prostate cancer Brother       Prior to Admission medications   Medication Sig Start Date End Date Taking? Authorizing Provider  albuterol (PROVENTIL HFA;VENTOLIN HFA) 108 (90 BASE) MCG/ACT inhaler Inhale 2 puffs into the lungs every 6 (six) hours as needed for wheezing or shortness of breath. 04/27/14   [name], MD  dutasteride (AVODART) 0.5 MG capsule TAKE (1) CAPSULE DAILY 12/06/15   [name], MD  furosemide (LASIX) 20 MG tablet TAKE ONE TABLET TWICE A DAY AS NEEDED FOR EDEMA 12/26/14   [name], MD  gabapentin (NEURONTIN) 600 MG tablet TAKE  (1)  TABLET  THREE TIMES DAILY. 10/05/15    [name], MD  ipratropium (ATROVENT) 0.02 % nebulizer solution NEBULIZE 1 VAIL 4 TIMES A DAY AS DIRECTED 03/21/15   Tanda Rockers, MD  isosorbide mononitrate (IMDUR) 30 MG 24 hr tablet Take 1 tablet (30 mg total) by mouth daily. 03/30/15   Thompson Grayer, MD  levalbuterol (XOPENEX) 0.63 MG/3ML nebulizer solution NEBULIZE 1 VIAL (WITH IPRATROPIUM) TWICE A DAY AS NEEDED. MAY USE EXTR A EVERY 4 HOURS IF NEEDED Patient taking differently: NEBULIZE 1 VIAL (WITH IPRATROPIUM) daily 06/20/14   [name], MD  mirtazapine (REMERON) 15 MG tablet TAKE 1 TABLET DAILY. 03/30/15   [name], MD  mupirocin ointment (BACTROBAN) 2 % Apply 1 application topically 2 (two) times daily.    Historical Provider, MD  nitroGLYCERIN (NITROSTAT) 0.4 MG SL tablet Place 1 tablet (0.4 mg total) under the tongue every 5 (five) minutes x 3 doses as needed for chest pain. 08/20/14   Rogelia Mire, NP  potassium chloride (K-DUR) 10 MEQ tablet TAKE (2) TABLETS TWICE DAILY. 11/16/15   [name], MD  potassium chloride (K-DUR,KLOR-CON) 10 MEQ tablet Take 20 mEq by mouth 2 (two) times daily.    Historical Provider, MD  rivastigmine (EXELON) 4.6 mg/24hr PLACE 1 PATCH ONTO SKIN DAILY AS DIRECTED 12/25/15   [name], MD  saccharomyces boulardii (FLORASTOR) 250 MG capsule Take 250 mg by mouth 2 (two) times daily.    Historical Provider, MD  simvastatin (ZOCOR) 20 MG tablet TAKE ONE TABLET AT BEDTIME 07/03/15   [name], MD  vitamin B-12 (CYANOCOBALAMIN) 500 MCG tablet Take 500 mcg by mouth daily.    Historical Provider, MD  warfarin (COUMADIN) 1 MG tablet TAKES 2MG  ON MON, WED AND FRI TAKES 3MG  ALL OTHER DAYS Patient taking differently: TAKES 3MG  ON MON, WED AND FRI TAKES 3MG  ALL OTHER DAYS 09/18/15   [name], MD    Physical Exam: BP 138/65   Pulse 114   Temp 98.3 F (36.8 C) (Oral)   Resp 23   Ht 5\' 11"  (1.803 m)   Wt 68 kg (150 lb)   SpO2 97%   BMI 20.92 kg/m    General:  Frail, NAD,  Eyes: PERRL ENT: unremarkable Neck: supple, no JVD Cardiovascular: IRRR Respiratory: CTABL Abdomen: soft/ND/ND, positive bowel sounds Skin: no rash Musculoskeletal:  No edema Psychiatric: calm/cooperative Neurologic: not oriented to time, per patient this is baseline, he is oriented to place/person. Mild residual right sided weakness          Labs on Admission:  Basic Metabolic Panel:  Recent Labs Lab 01/11/16 1608 01/11/16 1613  NA 141 144  K 3.0* 3.1*  CL 104 102  CO2 27  --   GLUCOSE 161*  155*  BUN 11 13  CREATININE 1.10 1.00  CALCIUM 9.1  --    Liver Function Tests:  Recent Labs Lab 01/11/16 1608  AST 26  ALT 17  ALKPHOS 58  BILITOT 0.6  PROT 7.3  ALBUMIN 4.1   No results for input(s): LIPASE, AMYLASE in the last 168 hours. No results for input(s): AMMONIA in the last 168 hours. CBC:  Recent Labs Lab 01/11/16 1608 01/11/16 1613  WBC 4.1  --   NEUTROABS 2.7  --   HGB 14.3 16.0  HCT 45.8 47.0  MCV 88.4  --   PLT 139*  --    Cardiac Enzymes: No results for input(s): CKTOTAL, CKMB, CKMBINDEX, TROPONINI in the last 168 hours.  BNP (last 3 results)  Recent Labs  01/11/16 1608  BNP 86.2    ProBNP (last 3 results) No results for input(s): PROBNP in the last 8760 hours.  CBG: No results for input(s): GLUCAP in the last 168 hours.  Radiological Exams on Admission: Dg Chest Portable 1 View  Result Date: 01/11/2016 CLINICAL DATA:  Respiratory distress, shortness of Breath EXAM: PORTABLE CHEST 1 VIEW COMPARISON:  01/27/2015 FINDINGS: Cardiomegaly again noted. Status post median sternotomy. Prominent size pulmonary artery bilaterally again noted suspicious for chronic portal hypertension. Large hiatal hernia again noted. Mild hyperinflation. No infiltrate or pulmonary edema. IMPRESSION: No infiltrate or pulmonary edema. Stable prominent central pulmonary artery bilaterally. Large hiatal hernia again noted. Electronically  Signed   By: Lahoma Crocker M.D.   On: 01/11/2016 15:21    EKG: Independently reviewed. afib/RVR  Assessment/Plan Present on Admission: **None**   Acute PE with right heart strain while on coumadin:  Risk factors; sedentary, patient has been sitting in chair most of the day for the last few months per wife. no h/o cancers, though does report weight loss Review chart INR has been subtherapeutic on many occasions, though INR today is close to target at 1.95 Will get echocardiogram/venous doppler  Start heparin drip, currently vital stable, on room air, no chest pain, will d/c abx for now.  Lactic acidosis; possibly from acute PE, on gentle hydration, no evidence of infection.  Afib/RVR:  Not on rate control agent at home, Heart rate improved on cardizem drip, continue cardizem drip. On heparin drip.  HTN; currently on cardizem drip, home meds imdur continued as well  COPD: currently no wheezing, continue prn nebs  FTT, will need PT eval once more stable  DVT prophylaxis: on heparin drip  Consultants: none  Code Status: DNR, confirmed with patient and wife  Family Communication:  Patient and wife at bedside  Disposition Plan: admit to stepdown  Time spent: 58mins  Macaria Bias MD, PhD [facility] Pager [phone] If 7PM-7AM, please contact night-coverage at [email or web address], password [facility]

## 2016-01-12 ENCOUNTER — Inpatient Hospital Stay (HOSPITAL_COMMUNITY): Payer: Medicare Other

## 2016-01-12 DIAGNOSIS — I2699 Other pulmonary embolism without acute cor pulmonale: Principal | ICD-10-CM

## 2016-01-12 DIAGNOSIS — I4891 Unspecified atrial fibrillation: Secondary | ICD-10-CM

## 2016-01-12 LAB — CBC
HEMATOCRIT: 37.7 % — AB (ref 39.0–52.0)
HEMOGLOBIN: 11.5 g/dL — AB (ref 13.0–17.0)
MCH: 26.8 pg (ref 26.0–34.0)
MCHC: 30.5 g/dL (ref 30.0–36.0)
MCV: 87.9 fL (ref 78.0–100.0)
Platelets: 119 10*3/uL — ABNORMAL LOW (ref 150–400)
RBC: 4.29 MIL/uL (ref 4.22–5.81)
RDW: 16.9 % — ABNORMAL HIGH (ref 11.5–15.5)
WBC: 6.1 10*3/uL (ref 4.0–10.5)

## 2016-01-12 LAB — BASIC METABOLIC PANEL
ANION GAP: 11 (ref 5–15)
BUN: 13 mg/dL (ref 6–20)
CALCIUM: 8.2 mg/dL — AB (ref 8.9–10.3)
CHLORIDE: 110 mmol/L (ref 101–111)
CO2: 18 mmol/L — AB (ref 22–32)
CREATININE: 1.08 mg/dL (ref 0.61–1.24)
GFR calc non Af Amer: >60 mL/min (ref >60)
Glucose, Bld: 184 mg/dL — ABNORMAL HIGH (ref 65–99)
Potassium: 3.9 mmol/L (ref 3.5–5.1)
SODIUM: 139 mmol/L (ref 135–145)

## 2016-01-12 LAB — URINALYSIS, ROUTINE W REFLEX MICROSCOPIC
Bilirubin Urine: NEGATIVE
GLUCOSE, UA: NEGATIVE mg/dL
Hgb urine dipstick: NEGATIVE
KETONES UR: 15 mg/dL — AB
LEUKOCYTES UA: NEGATIVE
Nitrite: NEGATIVE
PH: 5.5 (ref 5.0–8.0)
PROTEIN: NEGATIVE mg/dL
Specific Gravity, Urine: 1.015 (ref 1.005–1.030)

## 2016-01-12 LAB — ECHOCARDIOGRAM COMPLETE
HEIGHTINCHES: 71 in
WEIGHTICAEL: 2416 [oz_av]

## 2016-01-12 LAB — HEPARIN LEVEL (UNFRACTIONATED)
HEPARIN UNFRACTIONATED: 0.68 [IU]/mL (ref 0.30–0.70)
HEPARIN UNFRACTIONATED: 0.82 [IU]/mL — AB (ref 0.30–0.70)
HEPARIN UNFRACTIONATED: 0.58 [IU]/mL (ref 0.30–0.70)

## 2016-01-12 LAB — LACTIC ACID, PLASMA
Lactic Acid, Venous: 5.9 mmol/L (ref 0.5–1.9)
Lactic Acid, Venous: 2.1 mmol/L (ref 0.5–1.9)

## 2016-01-12 LAB — TROPONIN I
TROPONIN I: 0.32 ng/mL — AB (ref <0.03)
Troponin I: 0.46 ng/mL (ref <0.03)

## 2016-01-12 LAB — PSA: PSA: 4.82 ng/mL — AB (ref 0.00–4.00)

## 2016-01-12 LAB — MAGNESIUM: MAGNESIUM: 1.8 mg/dL (ref 1.7–2.4)

## 2016-01-12 MED ORDER — GABAPENTIN 300 MG PO CAPS
300.0000 mg | ORAL_CAPSULE | Freq: Three times a day (TID) | ORAL | Status: DC
  Administered 2016-01-12 – 2016-01-15 (×10): 300 mg via ORAL
  Filled 2016-01-12 (×10): qty 1

## 2016-01-12 MED ORDER — LEVALBUTEROL HCL 0.63 MG/3ML IN NEBU
0.6300 mg | INHALATION_SOLUTION | Freq: Four times a day (QID) | RESPIRATORY_TRACT | Status: DC
  Administered 2016-01-12 – 2016-01-13 (×6): 0.63 mg via RESPIRATORY_TRACT
  Filled 2016-01-12 (×6): qty 3

## 2016-01-12 MED ORDER — ATORVASTATIN CALCIUM 10 MG PO TABS
10.0000 mg | ORAL_TABLET | Freq: Every day | ORAL | Status: DC
  Administered 2016-01-12 – 2016-01-15 (×4): 10 mg via ORAL
  Filled 2016-01-12 (×4): qty 1

## 2016-01-12 MED ORDER — GABAPENTIN 600 MG PO TABS
600.0000 mg | ORAL_TABLET | Freq: Three times a day (TID) | ORAL | Status: DC
  Administered 2016-01-12 (×2): 600 mg via ORAL
  Filled 2016-01-12 (×2): qty 1

## 2016-01-12 NOTE — Consult Note (Signed)
82 year old with atrial fibrillation on Coumadin was admitted with generalized with weakness and feeling that his legs could not hold him up. His INR had been subtherapeutic from 1.6-1.9 range and Coumadin had been recently increased. CT angiogram showed left-sided poor embolism with RV/LV ratio of 1.1 He complains of substernal heaviness He smoked heavily until he quit in the 80s-about 30-pack-years He has a sedentary lifestyle  Past Medical History:  Diagnosis Date  . Anemia   . ASD (atrial septal defect)    a. s/p repair 1983.  [name] BPH (benign prostatic hyperplasia)   . Cerebrovascular accident ([place]) [place]  . Chronic bronchitis ([place])    a. h/o resp failure in setting of bronchitis vs PNA 05/2012.  [name] Chronic diarrhea   . COPD (chronic obstructive pulmonary disease) ([place])   . Coronary artery disease    a. Nonobstructive by cath 2009 and 09/06/14  . Depression   . Dysphagia, unspecified(787.20)   . Erosive gastritis    H/o GIB felt secondary to this  . Esophagitis   . Gastric polyp   . Gastric ulcer   . GERD (gastroesophageal reflux disease)   . Hearing impairment   . Hiatal hernia   . History of upper gastrointestinal hemorrhage    dr. Maurene Capes, GI  . Hyperlipidemia   . Hypertension   . Internal hemorrhoids   . LV dysfunction    a. EF 45-50% by echo 05/2012.  [name] Permanent atrial fibrillation (HCC)    INR followed at [facility].  [name] Vision problems      Past Surgical History:  Procedure Laterality Date  . ASD REPAIR  1983  . CARDIOVERSION     possibly 3 times, dr. brody/cooper  . CHOLECYSTECTOMY  1990s  . ENTEROSCOPY  05/13/2012   Procedure: ENTEROSCOPY;  Surgeon: Inda Castle, MD;  Location: [facility] ENDOSCOPY;  Service: Endoscopy;  Laterality: N/A;  . [place]  . LEFT HEART CATHETERIZATION WITH CORONARY ANGIOGRAM N/A 09/06/2014   Procedure: LEFT HEART CATHETERIZATION WITH CORONARY ANGIOGRAM;  Surgeon: Jettie Booze, MD;  Location: [facility] CATH LAB;  Service:  Cardiovascular;  Laterality: N/A;  . partial small bowel resection  2005   ischemic?     On exam-S1-S2 regular, clear lungs, no pedal edema, no JVD, soft nontender abdomen  Labs-rising lactate, peak troponin 0.49  Impression/plan Acute pulmonary embolism - continue IV heparin for now Check venous Doppler of lower extremities for completion and echocardiogram for RV function. I'm not sure this counts as is warfarin failure since level was subtherapeutic. Options include placing back on warfarin with an INR goal of 2.5 and above or using novel anticoagulant   [facility] M will be available as needed  Kara Mead MD. FCCP. [facility] Pager [phone] If no response call [phone]   01/12/2016

## 2016-01-12 NOTE — Progress Notes (Signed)
CRITICAL VALUE ALERT  Critical value received: Lactic acid 8.1, Troponin 0.49  Date of notification:  01/11/16  Time of notification:22:09  Critical value read back:Yes.    Nurse who received alert:  Estanislado Pandy  MD notified (1st page):  Tylene Fantasia, NP   Time of first page 22:30  MD notified (2nd page):  Time of second page:  Responding MD:  Tylene Fantasia, NP   Time MD responded: 22:40

## 2016-01-12 NOTE — Progress Notes (Signed)
ANTICOAGULATION CONSULT NOTE - Follow-up Consult  Pharmacy Consult for Heparin Indication: pulmonary embolus  Allergies  Allergen Reactions  . Tape Other (See Comments)    SKIN WILL TEAR IF TAPED (MUST BE LIGHT & BRIEF OR USE COBAN WRAP, PLEASE!!)  . Penicillins Hives    Has patient had a PCN reaction causing immediate rash, facial/tongue/throat swelling, SOB or lightheadedness with hypotension: Yes Has patient had a PCN reaction causing severe rash involving mucus membranes or skin necrosis: No Has patient had a PCN reaction that required hospitalization: Unknown Has patient had a PCN reaction occurring within the last 10 years: No If all of the above answers are "NO", then may proceed with Cephalosporin use.     Patient Measurements: Height: 5\' 11"  (180.3 cm) Weight: 151 lb 9.6 oz (68.8 kg) IBW/kg (Calculated) : 75.3 Heparin Dosing Weight: 68 kg  Vital Signs: Temp: 98.5 F (36.9 C) (08/10 2354) Temp Source: Oral (08/10 2354) BP: 131/50 (08/10 2354) Pulse Rate: 82 (08/10 2354)  Labs:  Recent Labs  01/10/16  01/11/16 1608 01/11/16 1613 01/11/16 2103 01/12/16 0212  HGB  --   < > 14.3 16.0  --  11.5*  HCT  --   --  45.8 47.0  --  37.7*  PLT  --   --  139*  --   --  PENDING  LABPROT  --   --  22.5*  --   --   --   INR 2.0  --  1.95  --   --   --   HEPARINUNFRC  --   --   --   --   --  0.68  CREATININE  --   --  1.10 1.00  --  1.08  TROPONINI  --   --   --   --  0.49*  --   < > = values in this interval not displayed.  Estimated Creatinine Clearance: 52.2 mL/min (by C-G formula based on SCr of 1.08 mg/dL).   Assessment: 82 yo M with Coumadin PTA for AFib. INR on admit is 1.95. Recent INRs in clinic slightly subtherapeutic to therapeutic at 1.8-2.0, warfarin held acutely, rx consulted to start heparin for new PE with R heart strain. Heparin level therapeutic on 1150 units/hr. Hgb down to 11.5 - will watch. No bleeding noted.  **PTA Coumadin dose: 2 mg T/Th/Sun, 3 mg  M/W/F/Sat  Goal of Therapy:  Heparin level 0.3-0.7 units/ml Monitor platelets by anticoagulation protocol: Yes   Plan:  Continue heparin at 1150 units/hr. Will f/u 6 hr heparin level to confirm therapeutic  Sherlon Handing, PharmD, BCPS Clinical pharmacist, pager [phone] 01/12/2016 3:31 AM

## 2016-01-12 NOTE — Progress Notes (Addendum)
PROGRESS NOTE    [name]  [id number] DOB: 1933-03-19 DOA: 01/11/2016 PCP: [name], MD  Brief Narrative: [name] is a 82 y.o. male  With h/o afib on coumadin, h/o cva with mild residual right sided weakness, h/o copd presented to [place] after he became extremely short of breath when he was walking.ED course: he is in afib/rvr upon arrival, bp elevated, no hypoxia, cbc unremarkable, bmp with k3.1, lactic acid 3.64, troponin negative. CTA + left sided PE with right heart strain, he was started on IV heparin, cardizem gtt and admitted to SDU  Assessment & Plan:   Acute PE with right heart strain while on coumadin: with subtherapeutic INR Risk factors; sedentary lifestyle, age Review chart INR has been subtherapeutic on many occasions -will FU Echocardiogram/venous doppler  -continue heparin drip today and after discussion with wife will transition to NOAC in 24-48hrs -hemodynamically stable now -mildly elevated troponin due to demand, will FU ECHO  Lactic acidosis;  -due to acute PE, vol depletion, no evidence of infection. -no need for Abx now  Afib/RVR:  Not on rate control agent at home, Heart rate improved, off cardizem drip, continue PO cardizem and change to CD tomorrow if stable -on heparin at this time  HTN;  -stable, continue cardizem, imdur  COPD: rare wheezing, continue prn nebs -scheduled xopenex  H/o CVA With residual L sided weakness and facial droop -on heparin now  Elevated PSA -needs Urology FU  FTT, will need PT eval once more stable  DVT prophylaxis: on heparin drip  Code Status: DNR Family Communication:  None at bedside, will attempt to reach wife today Disposition Plan: keep in SDU   Subjective: Some dyspnea, but a little better  Objective: Vitals:   01/12/16 0347 01/12/16 0400 01/12/16 0734 01/12/16 0735  BP: 134/72 (!) 129/57 (!) 142/59   Pulse:  73    Resp:  18    Temp:  97.7 F (36.5 C)  97.5 F  (36.4 C)  TempSrc:  Oral  Oral  SpO2:  97% 97%   Weight:  68.5 kg (151 lb)    Height:        Intake/Output Summary (Last 24 hours) at 01/12/16 1109 Last data filed at 01/12/16 0854  Gross per 24 hour  Intake              243 ml  Output              200 ml  Net               43 ml   Filed Weights   01/11/16 1429 01/11/16 2048 01/12/16 0400  Weight: 68 kg (150 lb) 68.8 kg (151 lb 9.6 oz) 68.5 kg (151 lb)    Examination:  General exam:  Elderly, chronically ill-appearing, right facial droop noted Respiratory system: Few rhonchi, rare expiratory wheezes  Cardiovascular system: S1 & S2 heard, RRR. No JVD, murmurs, rubs, gallops or clicks. No pedal edema. Gastrointestinal system: Abdomen is nondistended, soft and nontender. No organomegaly or masses felt. Normal bowel sounds heard. Central nervous system: Alert and oriented. Left hemiplegia, facial droop Extremities: Symmetric 5 x 5 power. Skin: No rashes, lesions or ulcers Psychiatry: Judgement and insight appear normal. Mood & affect appropriate.     Data Reviewed: I have personally reviewed following labs and imaging studies  CBC:  Recent Labs Lab 01/11/16 1608 01/11/16 1613 01/12/16 0212  WBC 4.1  --  6.1  NEUTROABS 2.7  --   --  HGB 14.3 16.0 11.5*  HCT 45.8 47.0 37.7*  MCV 88.4  --  87.9  PLT 139*  --  123456*   Basic Metabolic Panel:  Recent Labs Lab 01/11/16 1608 01/11/16 1613 01/12/16 0212  NA 141 144 139  K 3.0* 3.1* 3.9  CL 104 102 110  CO2 27  --  18*  GLUCOSE 161* 155* 184*  BUN 11 13 13   CREATININE 1.10 1.00 1.08  CALCIUM 9.1  --  8.2*  MG  --   --  1.8   GFR: Estimated Creatinine Clearance: 52 mL/min (by C-G formula based on SCr of 1.08 mg/dL). Liver Function Tests:  Recent Labs Lab 01/11/16 1608  AST 26  ALT 17  ALKPHOS 58  BILITOT 0.6  PROT 7.3  ALBUMIN 4.1   No results for input(s): LIPASE, AMYLASE in the last 168 hours. No results for input(s): AMMONIA in the last 168  hours. Coagulation Profile:  Recent Labs Lab 01/10/16 01/11/16 1608  INR 2.0 1.95   Cardiac Enzymes:  Recent Labs Lab 01/11/16 2103 01/12/16 0212  TROPONINI 0.49* 0.46*   BNP (last 3 results) No results for input(s): PROBNP in the last 8760 hours. HbA1C: No results for input(s): HGBA1C in the last 72 hours. CBG: No results for input(s): GLUCAP in the last 168 hours. Lipid Profile: No results for input(s): CHOL, HDL, LDLCALC, TRIG, CHOLHDL, LDLDIRECT in the last 72 hours. Thyroid Function Tests: No results for input(s): TSH, T4TOTAL, FREET4, T3FREE, THYROIDAB in the last 72 hours. Anemia Panel: No results for input(s): VITAMINB12, FOLATE, FERRITIN, TIBC, IRON, RETICCTPCT in the last 72 hours. Urine analysis:    Component Value Date/Time   COLORURINE YELLOW 01/12/2016 0530   APPEARANCEUR CLEAR 01/12/2016 0530   LABSPEC 1.015 01/12/2016 0530   PHURINE 5.5 01/12/2016 0530   GLUCOSEU NEGATIVE 01/12/2016 0530   HGBUR NEGATIVE 01/12/2016 0530   BILIRUBINUR NEGATIVE 01/12/2016 0530   BILIRUBINUR 1+ 05/11/2015 1420   KETONESUR 15 (A) 01/12/2016 0530   PROTEINUR NEGATIVE 01/12/2016 0530   UROBILINOGEN 0.2 05/11/2015 1420   UROBILINOGEN 0.2 11/16/2012 1237   NITRITE NEGATIVE 01/12/2016 0530   LEUKOCYTESUR NEGATIVE 01/12/2016 0530   Sepsis Labs: @LABRCNTIP (procalcitonin:4,lacticidven:4)  ) Recent Results (from the past 240 hour(s))  MRSA PCR Screening     Status: None   Collection Time: 01/11/16  8:52 PM  Result Value Ref Range Status   MRSA by PCR NEGATIVE NEGATIVE Final    Comment:        The GeneXpert MRSA Assay (FDA approved for NASAL specimens only), is one component of a comprehensive MRSA colonization surveillance program. It is not intended to diagnose MRSA infection nor to guide or monitor treatment for MRSA infections.          Radiology Studies: Ct Angio Chest Pe W Or Wo Contrast  Result Date: 01/11/2016 CLINICAL DATA:  Pt having SOB after  walking from car to his house today. Pt denies chest pain EXAM: CT ANGIOGRAPHY CHEST WITH CONTRAST TECHNIQUE: Multidetector CT imaging of the chest was performed using the standard protocol during bolus administration of intravenous contrast. Multiplanar CT image reconstructions and MIPs were obtained to evaluate the vascular anatomy. CONTRAST:  100 mL of Isovue 370 intravenous contrast COMPARISON:  Current chest radiograph FINDINGS: Angiographic study: There are eccentric pulmonary emboli on the left. Thrombus extends from the peripheral left main pulmonary artery into the origins of the upper and lower lobe segmental branches. Thrombus is nonocclusive. There is no convincing pulmonary embolus on the  right. All the main pulmonary arteries are enlarged, right measuring 4.3 cm in diameter and left measuring 3.8 cm. Main pulmonary artery is also dilated but less prominent. Aorta is normal caliber. There is no dissection. Atherosclerotic calcifications are noted along the thoracic aorta and the branch vessels. There also moderate coronary artery calcifications. For Positive for acute PE with CT evidence of right heart strain (RV/LV Ratio = 1.1) consistent with at least submassive (intermediate risk) PE. The presence of right heart strain has been associated with an increased risk of morbidity and mortality. Please activate Code PE by paging [phone]. Neck base and axilla:  No mass or adenopathy. Mediastinum and hila: Heart is mildly enlarged with a prominence of the atria. No mediastinal or hilar masses or pathologically enlarged lymph nodes. Lungs and pleura: There is atelectasis with volume loss in the left lower lobe. Milder areas of dependent reticular opacity noted there also likely due to atelectasis. There changes of emphysema. No evidence of pneumonia or edema. No mass or suspicious nodule. No pleural effusion or pneumothorax. Limited upper abdomen: Moderate to large hiatal hernia. Probable fatty  infiltration of the liver. Liver shows morphologic changes that raise suspicion for cirrhosis. No acute findings. Musculoskeletal: Status post median sternotomy for CABG surgery. There are degenerative changes of the visualized spine. No osteoblastic or osteolytic lesions. Review of the MIP images confirms the above findings. IMPRESSION: 1. Left-sided pulmonary emboli, nonocclusive. RV LV ratio suggest right heart strain. 2. The partial left lower lobe atelectasis. Emphysema. No evidence of pneumonia or pulmonary edema. 3. Enlarged pulmonary arteries consistent with pulmonary hypertension. Critical Value/emergent results were called by telephone at the time of interpretation on 01/11/2016 at 6:15 pm to Dr. Ellender Hose, who verbally acknowledged these results. Electronically Signed   By: Lajean Manes M.D.   On: 01/11/2016 18:16   Dg Chest Portable 1 View  Result Date: 01/11/2016 CLINICAL DATA:  Respiratory distress, shortness of Breath EXAM: PORTABLE CHEST 1 VIEW COMPARISON:  01/27/2015 FINDINGS: Cardiomegaly again noted. Status post median sternotomy. Prominent size pulmonary artery bilaterally again noted suspicious for chronic portal hypertension. Large hiatal hernia again noted. Mild hyperinflation. No infiltrate or pulmonary edema. IMPRESSION: No infiltrate or pulmonary edema. Stable prominent central pulmonary artery bilaterally. Large hiatal hernia again noted. Electronically Signed   By: Lahoma Crocker M.D.   On: 01/11/2016 15:21        Scheduled Meds: . diltiazem  30 mg Oral Q6H  . dutasteride  0.5 mg Oral Daily  . gabapentin  600 mg Oral TID  . isosorbide mononitrate  30 mg Oral QHS  . levalbuterol  0.63 mg Nebulization Q6H  . mirtazapine  7.5 mg Oral QHS  . potassium chloride  20 mEq Oral Daily  . rivastigmine  4.6 mg Transdermal Daily  . saccharomyces boulardii  250 mg Oral q morning - 10a  . simvastatin  20 mg Oral QHS  . sodium chloride flush  3 mL Intravenous Q12H  . vitamin B-12  500 mcg  Oral Daily   Continuous Infusions: . heparin 1,150 Units/hr (01/11/16 1916)     LOS: 1 day    Time spent: 54min    Domenic Polite, MD [facility] Pager [phone]  If 7PM-7AM, please contact night-coverage [email or web address] Password [email or web address] 01/12/2016, 11:09 AM

## 2016-01-12 NOTE — Care Management Important Message (Signed)
Important Message  Patient Details  Name: CHANCELER KOLB MRN: [id number] Date of Birth: March 12, 1933   Medicare Important Message Given:  Yes    Loann Quill 01/12/2016, 1:31 PM

## 2016-01-12 NOTE — Progress Notes (Signed)
**  Preliminary report by tech**  Bilateral lower extremity venous duplex completed. There is no evidence of deep or superficial vein thrombosis involving the right and left lower extremities. All visualized vessels appear patent and compressible. There is no evidence of Baker's cysts bilaterally.  01/12/16 3:38 PM Carlos Levering RVT

## 2016-01-12 NOTE — Progress Notes (Signed)
ANTICOAGULATION CONSULT NOTE - Follow-up Consult  Pharmacy Consult for Heparin Indication: pulmonary embolus  Allergies  Allergen Reactions  . Tape Other (See Comments)    SKIN WILL TEAR IF TAPED (MUST BE LIGHT & BRIEF OR USE COBAN WRAP, PLEASE!!)  . Penicillins Hives    Has patient had a PCN reaction causing immediate rash, facial/tongue/throat swelling, SOB or lightheadedness with hypotension: Yes Has patient had a PCN reaction causing severe rash involving mucus membranes or skin necrosis: No Has patient had a PCN reaction that required hospitalization: Unknown Has patient had a PCN reaction occurring within the last 10 years: No If all of the above answers are "NO", then may proceed with Cephalosporin use.     Patient Measurements: Height: 5\' 11"  (180.3 cm) Weight: 151 lb (68.5 kg) IBW/kg (Calculated) : 75.3 Heparin Dosing Weight: 68 kg  Vital Signs: Temp: 98.6 F (37 C) (08/11 1237) Temp Source: Oral (08/11 1237) BP: 142/59 (08/11 0734) Pulse Rate: 73 (08/11 0400)  Labs:  Recent Labs  01/10/16  01/11/16 1608 01/11/16 1613 01/11/16 2103 01/12/16 0212 01/12/16 1135  HGB  --   < > 14.3 16.0  --  11.5*  --   HCT  --   --  45.8 47.0  --  37.7*  --   PLT  --   --  139*  --   --  119*  --   LABPROT  --   --  22.5*  --   --   --   --   INR 2.0  --  1.95  --   --   --   --   HEPARINUNFRC  --   --   --   --   --  0.68 0.82*  CREATININE  --   --  1.10 1.00  --  1.08  --   TROPONINI  --   --   --   --  0.49* 0.46* 0.32*  < > = values in this interval not displayed.  Estimated Creatinine Clearance: 52 mL/min (by C-G formula based on SCr of 1.08 mg/dL).   Assessment: 82 yo M with Coumadin PTA for AFib. INR on admit is 1.95. Recent INRs in clinic slightly subtherapeutic to therapeutic at 1.8-2.0, warfarin held acutely, rx consulted to start heparin for new PE with R heart strain. Heparin level now above goal on 1150 units/hr. Hgb down to 11.5 - will watch. No bleeding  noted.  **PTA Coumadin dose: 2 mg T/Th/Sun, 3 mg M/W/F/Sat  Goal of Therapy:  Heparin level 0.3-0.7 units/ml Monitor platelets by anticoagulation protocol: Yes   Plan:  Reduce heparin to 1000 units/hr. Check heparin level in 6 hours Heparin level and CBC daily.  [employer], Pharm.D., BCPS Clinical Pharmacist Pager [phone] 01/12/2016 1:43 PM

## 2016-01-12 NOTE — Progress Notes (Signed)
  Echocardiogram 2D Echocardiogram has been performed.  [name] 01/12/2016, 12:34 PM

## 2016-01-12 NOTE — Progress Notes (Signed)
ANTICOAGULATION CONSULT NOTE - Follow-up Consult  Pharmacy Consult for Heparin Indication: pulmonary embolus  Allergies  Allergen Reactions  . Tape Other (See Comments)    SKIN WILL TEAR IF TAPED (MUST BE LIGHT & BRIEF OR USE COBAN WRAP, PLEASE!!)  . Penicillins Hives    Has patient had a PCN reaction causing immediate rash, facial/tongue/throat swelling, SOB or lightheadedness with hypotension: Yes Has patient had a PCN reaction causing severe rash involving mucus membranes or skin necrosis: No Has patient had a PCN reaction that required hospitalization: Unknown Has patient had a PCN reaction occurring within the last 10 years: No If all of the above answers are "NO", then may proceed with Cephalosporin use.     Patient Measurements: Height: 5\' 11"  (180.3 cm) Weight: 151 lb (68.5 kg) IBW/kg (Calculated) : 75.3 Heparin Dosing Weight: 68 kg  Vital Signs: Temp: 98.5 F (36.9 C) (08/11 2026) Temp Source: Oral (08/11 2026) BP: 176/64 (08/11 2026) Pulse Rate: 64 (08/11 2026)  Labs:  Recent Labs  01/10/16  01/11/16 1608 01/11/16 1613 01/11/16 2103 01/12/16 0212 01/12/16 1135 01/12/16 2038  HGB  --   < > 14.3 16.0  --  11.5*  --   --   HCT  --   --  45.8 47.0  --  37.7*  --   --   PLT  --   --  139*  --   --  119*  --   --   LABPROT  --   --  22.5*  --   --   --   --   --   INR 2.0  --  1.95  --   --   --   --   --   HEPARINUNFRC  --   --   --   --   --  0.68 0.82* 0.58  CREATININE  --   --  1.10 1.00  --  1.08  --   --   TROPONINI  --   --   --   --  0.49* 0.46* 0.32*  --   < > = values in this interval not displayed.  Estimated Creatinine Clearance: 52 mL/min (by C-G formula based on SCr of 1.08 mg/dL).   Assessment: 82 yo M with Coumadin PTA for AFib. INR on admit is 1.95. Recent INRs in clinic slightly subtherapeutic to therapeutic at 1.8-2.0, warfarin held acutely, rx consulted to start heparin for new PE with R heart strain. HL now therapeutic at 0.58 after rate  decrease. Continue current rate. Hgb down to 11.5 - will watch. No bleeding noted.  **PTA Coumadin dose: 2 mg T/Th/Sun, 3 mg M/W/F/Sat  Goal of Therapy:  Heparin level 0.3-0.7 units/ml Monitor platelets by anticoagulation protocol: Yes   Plan:  Continue heparin gtt at 1,000 units/hr Monitor daily HL, CBC, s/s of bleed  Elenor Quinones, PharmD, BCPS Clinical Pharmacist Pager [phone] 01/12/2016 9:20 PM

## 2016-01-13 LAB — CBC
HCT: 38.6 % — ABNORMAL LOW (ref 39.0–52.0)
HEMOGLOBIN: 11.8 g/dL — AB (ref 13.0–17.0)
MCH: 27.2 pg (ref 26.0–34.0)
MCHC: 30.6 g/dL (ref 30.0–36.0)
MCV: 88.9 fL (ref 78.0–100.0)
PLATELETS: 145 10*3/uL — AB (ref 150–400)
RBC: 4.34 MIL/uL (ref 4.22–5.81)
RDW: 17.2 % — ABNORMAL HIGH (ref 11.5–15.5)
WBC: 12 10*3/uL — AB (ref 4.0–10.5)

## 2016-01-13 LAB — URINE CULTURE: CULTURE: NO GROWTH

## 2016-01-13 LAB — BASIC METABOLIC PANEL
ANION GAP: 6 (ref 5–15)
BUN: 18 mg/dL (ref 6–20)
CALCIUM: 8.7 mg/dL — AB (ref 8.9–10.3)
CO2: 29 mmol/L (ref 22–32)
CREATININE: 0.98 mg/dL (ref 0.61–1.24)
Chloride: 104 mmol/L (ref 101–111)
GFR calc non Af Amer: >60 mL/min (ref >60)
Glucose, Bld: 90 mg/dL (ref 65–99)
Potassium: 4.7 mmol/L (ref 3.5–5.1)
SODIUM: 139 mmol/L (ref 135–145)

## 2016-01-13 LAB — HEPARIN LEVEL (UNFRACTIONATED): HEPARIN UNFRACTIONATED: 0.52 [IU]/mL (ref 0.30–0.70)

## 2016-01-13 MED ORDER — DILTIAZEM HCL ER 60 MG PO CP12
60.0000 mg | ORAL_CAPSULE | Freq: Two times a day (BID) | ORAL | Status: DC
  Administered 2016-01-13 – 2016-01-14 (×3): 60 mg via ORAL
  Filled 2016-01-13 (×3): qty 1

## 2016-01-13 MED ORDER — RIVAROXABAN 20 MG PO TABS: 20.0000 mg | ORAL_TABLET | Freq: Every day | ORAL | Status: DC

## 2016-01-13 MED ORDER — FUROSEMIDE 10 MG/ML IJ SOLN
40.0000 mg | Freq: Once | INTRAMUSCULAR | Status: AC
  Administered 2016-01-13: 40 mg via INTRAVENOUS
  Filled 2016-01-13: qty 4

## 2016-01-13 MED ORDER — METHYLPREDNISOLONE SODIUM SUCC 125 MG IJ SOLR
60.0000 mg | Freq: Two times a day (BID) | INTRAMUSCULAR | Status: DC
  Administered 2016-01-13 – 2016-01-14 (×4): 60 mg via INTRAVENOUS
  Filled 2016-01-13 (×4): qty 2

## 2016-01-13 MED ORDER — RIVAROXABAN 15 MG PO TABS
15.0000 mg | ORAL_TABLET | Freq: Two times a day (BID) | ORAL | Status: DC
  Administered 2016-01-13 – 2016-01-15 (×6): 15 mg via ORAL
  Filled 2016-01-13 (×6): qty 1

## 2016-01-13 MED ORDER — THROMBIN 5000 UNITS EX SOLR
Freq: Once | CUTANEOUS | Status: AC
  Administered 2016-01-13: 5000 [IU] via TOPICAL
  Filled 2016-01-13: qty 5000

## 2016-01-13 NOTE — Progress Notes (Addendum)
ANTICOAGULATION CONSULT NOTE - Follow-up Consult  Pharmacy Consult for Heparin Indication: pulmonary embolus  Allergies  Allergen Reactions  . Tape Other (See Comments)    SKIN WILL TEAR IF TAPED (MUST BE LIGHT & BRIEF OR USE COBAN WRAP, PLEASE!!)  . Penicillins Hives    Has patient had a PCN reaction causing immediate rash, facial/tongue/throat swelling, SOB or lightheadedness with hypotension: Yes Has patient had a PCN reaction causing severe rash involving mucus membranes or skin necrosis: No Has patient had a PCN reaction that required hospitalization: Unknown Has patient had a PCN reaction occurring within the last 10 years: No If all of the above answers are "NO", then may proceed with Cephalosporin use.     Patient Measurements: Height: 5\' 11"  (180.3 cm) Weight: 153 lb 9.6 oz (69.7 kg) IBW/kg (Calculated) : 75.3 Heparin Dosing Weight: 68 kg  Vital Signs: Temp: 98.1 F (36.7 C) (08/12 0449) Temp Source: Oral (08/12 0449) BP: 134/84 (08/12 0449) Pulse Rate: 80 (08/12 0449)  Labs:  Recent Labs  01/11/16 1608 01/11/16 1613 01/11/16 2103  01/12/16 0212 01/12/16 1135 01/12/16 2038 01/13/16 0455  HGB 14.3 16.0  --   --  11.5*  --   --  11.8*  HCT 45.8 47.0  --   --  37.7*  --   --  38.6*  PLT 139*  --   --   --  119*  --   --  145*  LABPROT 22.5*  --   --   --   --   --   --   --   INR 1.95  --   --   --   --   --   --   --   HEPARINUNFRC  --   --   --   < > 0.68 0.82* 0.58 0.52  CREATININE 1.10 1.00  --   --  1.08  --   --  0.98  TROPONINI  --   --  0.49*  --  0.46* 0.32*  --   --   < > = values in this interval not displayed.  Estimated Creatinine Clearance: 58.3 mL/min (by C-G formula based on SCr of 0.98 mg/dL).   Assessment: 82 yo M with Coumadin PTA for AFib. INR on admit is 1.95. Recent INRs in clinic slightly subtherapeutic to therapeutic at 1.8-2.0, warfarin held acutely, rx consulted to start heparin for new PE with R heart strain. Overnight on 8/12,  RN reported pt bleeding profusely from d/c'd IV site. - Hgb 11.8 this morning still stable, Plts 145 and stable - Discussed with nurse around 0830 and doctor does still want to continue the heparin so will dose for now - HL = 0.52 on 1000 units/hr  **PTA Coumadin dose: 2 mg T/Th/Sun, 3 mg M/W/F/Sat  Goal of Therapy:  Heparin level 0.3-0.7 units/ml Monitor platelets by anticoagulation protocol: Yes   Plan:  Continue heparin gtt at 1,000 units/hr Monitor daily HL, CBC, bleeding issue from d/ced line  Wyonia Hough), PharmD  PGY1 Pharmacy Resident Pager: [phone] 01/13/2016 8:40 AM   I discussed / reviewed the pharmacy note by Dr. Tracey Harries and I agree with the resident's findings and plans as documented.  [employer], Pharm.D., BCPS Clinical Pharmacist Pager [phone] 01/13/2016 8:55 AM

## 2016-01-13 NOTE — Progress Notes (Signed)
ANTICOAGULATION CONSULT NOTE - Initial Consult  Pharmacy Consult for xarelto Indication: pulmonary embolus  Allergies  Allergen Reactions  . Tape Other (See Comments)    SKIN WILL TEAR IF TAPED (MUST BE LIGHT & BRIEF OR USE COBAN WRAP, PLEASE!!)  . Penicillins Hives    Has patient had a PCN reaction causing immediate rash, facial/tongue/throat swelling, SOB or lightheadedness with hypotension: Yes Has patient had a PCN reaction causing severe rash involving mucus membranes or skin necrosis: No Has patient had a PCN reaction that required hospitalization: Unknown Has patient had a PCN reaction occurring within the last 10 years: No If all of the above answers are "NO", then may proceed with Cephalosporin use.     Patient Measurements: Height: 5\' 11"  (180.3 cm) Weight: 153 lb 9.6 oz (69.7 kg) IBW/kg (Calculated) : 75.3  Vital Signs: Temp: 98.1 F (36.7 C) (08/12 0449) Temp Source: Oral (08/12 0449) BP: 156/90 (08/12 1013) Pulse Rate: 80 (08/12 0449)  Labs:  Recent Labs  01/11/16 1608 01/11/16 1613 01/11/16 2103  01/12/16 0212 01/12/16 1135 01/12/16 2038 01/13/16 0455  HGB 14.3 16.0  --   --  11.5*  --   --  11.8*  HCT 45.8 47.0  --   --  37.7*  --   --  38.6*  PLT 139*  --   --   --  119*  --   --  145*  LABPROT 22.5*  --   --   --   --   --   --   --   INR 1.95  --   --   --   --   --   --   --   HEPARINUNFRC  --   --   --   < > 0.68 0.82* 0.58 0.52  CREATININE 1.10 1.00  --   --  1.08  --   --  0.98  TROPONINI  --   --  0.49*  --  0.46* 0.32*  --   --   < > = values in this interval not displayed.  Estimated Creatinine Clearance: 58.3 mL/min (by C-G formula based on SCr of 0.98 mg/dL).   Medical History: Past Medical History:  Diagnosis Date  . Anemia   . ASD (atrial septal defect)    a. s/p repair 1983.  [name] BPH (benign prostatic hyperplasia)   . Cerebrovascular accident ([place]) [place]  . Chronic bronchitis ([place])    a. h/o resp failure in setting of  bronchitis vs PNA 05/2012.  [name] Chronic diarrhea   . COPD (chronic obstructive pulmonary disease) ([place])   . Coronary artery disease    a. Nonobstructive by cath 2009 and 09/06/14  . Depression   . Dysphagia, unspecified(787.20)   . Erosive gastritis    H/o GIB felt secondary to this  . Esophagitis   . Gastric polyp   . Gastric ulcer   . GERD (gastroesophageal reflux disease)   . Hearing impairment   . Hiatal hernia   . History of upper gastrointestinal hemorrhage    dr. Maurene Capes, GI  . Hyperlipidemia   . Hypertension   . Internal hemorrhoids   . LV dysfunction    a. EF 45-50% by echo 05/2012.  [name] Permanent atrial fibrillation (HCC)    INR followed at [facility].  [name] Vision problems     Assessment: 82 yo M with Coumadin PTA for AFib. After a course of heparin, patient now to start on Xarelto. Overnight on 8/12, RN reported pt  bleeding profusely from d/c'd IV site while still on heparin. -Hgb 11.8 this morning still stable, Plts 145 and stable  Goal of Therapy:  Monitor platelets by anticoagulation protocol: Yes   Plan:  Xarelto 15mg  BID w/ food x 21 days, then 20mg  w/ dinner daily. Monitor CBC, and f/u bleeding  Discontinue heparin and associated labs Xarelto Education  Myer Peer Grayland Ormond), PharmD  PGY1 Pharmacy Resident Pager: [phone] 01/13/2016 10:21 AM   I discussed / reviewed the pharmacy note by Dr. Tracey Harries  and I agree with the resident's findings and plans as documented.  [employer], Pharm.D., BCPS Clinical Pharmacist Pager [phone] 01/13/2016 11:32 AM

## 2016-01-13 NOTE — Progress Notes (Signed)
PROGRESS NOTE    [name]  [id number] DOB: 1933-04-13 DOA: 01/11/2016 PCP: [name], MD  Brief Narrative: [name] is a 82 y.o. male  With h/o afib on coumadin, h/o cva with mild residual right sided weakness, h/o copd presented to [place] after he became extremely short of breath when he was walking.ED course: he is in afib/rvr upon arrival, bp elevated, no hypoxia, cbc unremarkable, bmp with k3.1, lactic acid 3.64, troponin negative. CTA + left sided PE with right heart strain, he was started on IV heparin, cardizem gtt and admitted to SDU  Assessment & Plan:   Acute PE with right heart strain while on coumadin: with subtherapeutic INR -Risk factors; sedentary lifestyle, age -Review chart INR has been subtherapeutic on many occasions -ECHO with normal EF and mod RV enlargement, venous doppler -no DVT -continue heparin drip today and after discussion with wife will transition to NOAC in 24-48hrs -hemodynamically stable now -mildly elevated troponin due to demand, will FU ECHO  Lactic acidosis;  -due to acute PE, vol depletion, no evidence of infection. -no need for Abx now -improved  Afib/RVR:  Not on rate control agent at home, Heart rate improved, off cardizem drip, continue PO cardizem and change to BID today, some bradycardia noted overnight -on heparin at this time, change to Xarelto, discussed NOACS with wife  -appears vol overloaded, lasix x1 now  HTN;  -stable, continue cardizem, imdur  COPD: exacerbation -active wheezing, continue prn nebs -scheduled xopenex, add Iv solumedrol  H/o CVA With residual L sided weakness and facial droop -on heparin now  Elevated PSA -needs Urology FU  FTT, will need PT eval once more stable  DVT prophylaxis: xarelto Code Status: DNR Family Communication:  called and d/w wife 8/11 Disposition Plan: keep in SDU   Subjective: Still with dyspnea, little better  Objective: Vitals:   01/13/16  0449 01/13/16 0745 01/13/16 1011 01/13/16 1013  BP: 134/84 134/61  (!) 156/90  Pulse: 80     Resp: 20     Temp: 98.1 F (36.7 C)     TempSrc: Oral     SpO2: 98%  98%   Weight: 69.7 kg (153 lb 9.6 oz)     Height:        Intake/Output Summary (Last 24 hours) at 01/13/16 1225 Last data filed at 01/13/16 0903  Gross per 24 hour  Intake              483 ml  Output              600 ml  Net             -117 ml   Filed Weights   01/11/16 2048 01/12/16 0400 01/13/16 0449  Weight: 68.8 kg (151 lb 9.6 oz) 68.5 kg (151 lb) 69.7 kg (153 lb 9.6 oz)    Examination:  General exam:  Elderly, chronically ill-appearing, right facial droop noted Respiratory system: expiratory wheezes and basil;ar crackles Cardiovascular system: S1 & S2 heard, RRR. No JVD, murmurs, rubs, gallops or clicks. No pedal edema. Gastrointestinal system: Abdomen is nondistended, soft and nontender. No organomegaly or masses felt. Normal bowel sounds heard. Central nervous system: Alert and oriented. Left hemiplegia, facial droop Extremities: Symmetric 5 x 5 power. Skin: No rashes, lesions or ulcers Psychiatry: Judgement and insight appear normal. Mood & affect appropriate.     Data Reviewed: I have personally reviewed following labs and imaging studies  CBC:  Recent Labs Lab 01/11/16  1608 01/11/16 1613 01/12/16 0212 01/13/16 0455  WBC 4.1  --  6.1 12.0*  NEUTROABS 2.7  --   --   --   HGB 14.3 16.0 11.5* 11.8*  HCT 45.8 47.0 37.7* 38.6*  MCV 88.4  --  87.9 88.9  PLT 139*  --  119* Q000111Q*   Basic Metabolic Panel:  Recent Labs Lab 01/11/16 1608 01/11/16 1613 01/12/16 0212 01/13/16 0455  NA 141 144 139 139  K 3.0* 3.1* 3.9 4.7  CL 104 102 110 104  CO2 27  --  18* 29  GLUCOSE 161* 155* 184* 90  BUN 11 13 13 18   CREATININE 1.10 1.00 1.08 0.98  CALCIUM 9.1  --  8.2* 8.7*  MG  --   --  1.8  --    GFR: Estimated Creatinine Clearance: 58.3 mL/min (by C-G formula based on SCr of 0.98 mg/dL). Liver  Function Tests:  Recent Labs Lab 01/11/16 1608  AST 26  ALT 17  ALKPHOS 58  BILITOT 0.6  PROT 7.3  ALBUMIN 4.1   No results for input(s): LIPASE, AMYLASE in the last 168 hours. No results for input(s): AMMONIA in the last 168 hours. Coagulation Profile:  Recent Labs Lab 01/10/16 01/11/16 1608  INR 2.0 1.95   Cardiac Enzymes:  Recent Labs Lab 01/11/16 2103 01/12/16 0212 01/12/16 1135  TROPONINI 0.49* 0.46* 0.32*   BNP (last 3 results) No results for input(s): PROBNP in the last 8760 hours. HbA1C: No results for input(s): HGBA1C in the last 72 hours. CBG: No results for input(s): GLUCAP in the last 168 hours. Lipid Profile: No results for input(s): CHOL, HDL, LDLCALC, TRIG, CHOLHDL, LDLDIRECT in the last 72 hours. Thyroid Function Tests: No results for input(s): TSH, T4TOTAL, FREET4, T3FREE, THYROIDAB in the last 72 hours. Anemia Panel: No results for input(s): VITAMINB12, FOLATE, FERRITIN, TIBC, IRON, RETICCTPCT in the last 72 hours. Urine analysis:    Component Value Date/Time   COLORURINE YELLOW 01/12/2016 0530   APPEARANCEUR CLEAR 01/12/2016 0530   LABSPEC 1.015 01/12/2016 0530   PHURINE 5.5 01/12/2016 0530   GLUCOSEU NEGATIVE 01/12/2016 0530   HGBUR NEGATIVE 01/12/2016 0530   BILIRUBINUR NEGATIVE 01/12/2016 0530   BILIRUBINUR 1+ 05/11/2015 1420   KETONESUR 15 (A) 01/12/2016 0530   PROTEINUR NEGATIVE 01/12/2016 0530   UROBILINOGEN 0.2 05/11/2015 1420   UROBILINOGEN 0.2 11/16/2012 1237   NITRITE NEGATIVE 01/12/2016 0530   LEUKOCYTESUR NEGATIVE 01/12/2016 0530   Sepsis Labs: @LABRCNTIP (procalcitonin:4,lacticidven:4)  ) Recent Results (from the past 240 hour(s))  Blood culture (routine x 2)     Status: None (Preliminary result)   Collection Time: 01/11/16  4:55 PM  Result Value Ref Range Status   Specimen Description BLOOD RIGHT FOREARM  Final   Special Requests BOTTLES DRAWN AEROBIC AND ANAEROBIC 5CC  Final   Culture NO GROWTH < 24 HOURS  Final    Report Status PENDING  Incomplete  Blood culture (routine x 2)     Status: None (Preliminary result)   Collection Time: 01/11/16  5:21 PM  Result Value Ref Range Status   Specimen Description BLOOD RIGHT ANTECUBITAL  Final   Special Requests BOTTLES DRAWN AEROBIC AND ANAEROBIC 5CC  Final   Culture NO GROWTH < 24 HOURS  Final   Report Status PENDING  Incomplete  MRSA PCR Screening     Status: None   Collection Time: 01/11/16  8:52 PM  Result Value Ref Range Status   MRSA by PCR NEGATIVE NEGATIVE Final  Comment:        The GeneXpert MRSA Assay (FDA approved for NASAL specimens only), is one component of a comprehensive MRSA colonization surveillance program. It is not intended to diagnose MRSA infection nor to guide or monitor treatment for MRSA infections.   Urine culture     Status: None   Collection Time: 01/12/16  5:30 AM  Result Value Ref Range Status   Specimen Description URINE, CLEAN CATCH  Final   Special Requests NONE  Final   Culture NO GROWTH  Final   Report Status 01/13/2016 FINAL  Final         Radiology Studies: Ct Angio Chest Pe W Or Wo Contrast  Result Date: 01/11/2016 CLINICAL DATA:  Pt having SOB after walking from car to his house today. Pt denies chest pain EXAM: CT ANGIOGRAPHY CHEST WITH CONTRAST TECHNIQUE: Multidetector CT imaging of the chest was performed using the standard protocol during bolus administration of intravenous contrast. Multiplanar CT image reconstructions and MIPs were obtained to evaluate the vascular anatomy. CONTRAST:  100 mL of Isovue 370 intravenous contrast COMPARISON:  Current chest radiograph FINDINGS: Angiographic study: There are eccentric pulmonary emboli on the left. Thrombus extends from the peripheral left main pulmonary artery into the origins of the upper and lower lobe segmental branches. Thrombus is nonocclusive. There is no convincing pulmonary embolus on the right. All the main pulmonary arteries are enlarged, right  measuring 4.3 cm in diameter and left measuring 3.8 cm. Main pulmonary artery is also dilated but less prominent. Aorta is normal caliber. There is no dissection. Atherosclerotic calcifications are noted along the thoracic aorta and the branch vessels. There also moderate coronary artery calcifications. For Positive for acute PE with CT evidence of right heart strain (RV/LV Ratio = 1.1) consistent with at least submassive (intermediate risk) PE. The presence of right heart strain has been associated with an increased risk of morbidity and mortality. Please activate Code PE by paging [phone]. Neck base and axilla:  No mass or adenopathy. Mediastinum and hila: Heart is mildly enlarged with a prominence of the atria. No mediastinal or hilar masses or pathologically enlarged lymph nodes. Lungs and pleura: There is atelectasis with volume loss in the left lower lobe. Milder areas of dependent reticular opacity noted there also likely due to atelectasis. There changes of emphysema. No evidence of pneumonia or edema. No mass or suspicious nodule. No pleural effusion or pneumothorax. Limited upper abdomen: Moderate to large hiatal hernia. Probable fatty infiltration of the liver. Liver shows morphologic changes that raise suspicion for cirrhosis. No acute findings. Musculoskeletal: Status post median sternotomy for CABG surgery. There are degenerative changes of the visualized spine. No osteoblastic or osteolytic lesions. Review of the MIP images confirms the above findings. IMPRESSION: 1. Left-sided pulmonary emboli, nonocclusive. RV LV ratio suggest right heart strain. 2. The partial left lower lobe atelectasis. Emphysema. No evidence of pneumonia or pulmonary edema. 3. Enlarged pulmonary arteries consistent with pulmonary hypertension. Critical Value/emergent results were called by telephone at the time of interpretation on 01/11/2016 at 6:15 pm to Dr. Ellender Hose, who verbally acknowledged these results. Electronically  Signed   By: Lajean Manes M.D.   On: 01/11/2016 18:16   Dg Chest Portable 1 View  Result Date: 01/11/2016 CLINICAL DATA:  Respiratory distress, shortness of Breath EXAM: PORTABLE CHEST 1 VIEW COMPARISON:  01/27/2015 FINDINGS: Cardiomegaly again noted. Status post median sternotomy. Prominent size pulmonary artery bilaterally again noted suspicious for chronic portal hypertension. Large hiatal hernia again noted.  Mild hyperinflation. No infiltrate or pulmonary edema. IMPRESSION: No infiltrate or pulmonary edema. Stable prominent central pulmonary artery bilaterally. Large hiatal hernia again noted. Electronically Signed   By: Lahoma Crocker M.D.   On: 01/11/2016 15:21        Scheduled Meds: . atorvastatin  10 mg Oral q1800  . diltiazem  60 mg Oral Q12H  . dutasteride  0.5 mg Oral Daily  . gabapentin  300 mg Oral TID  . isosorbide mononitrate  30 mg Oral QHS  . levalbuterol  0.63 mg Nebulization Q6H  . methylPREDNISolone (SOLU-MEDROL) injection  60 mg Intravenous Q12H  . mirtazapine  7.5 mg Oral QHS  . potassium chloride  20 mEq Oral Daily  . rivaroxaban  15 mg Oral BID WC   Followed by  . [START ON 02/03/2016] rivaroxaban  20 mg Oral Q supper  . rivastigmine  4.6 mg Transdermal Daily  . saccharomyces boulardii  250 mg Oral q morning - 10a  . sodium chloride flush  3 mL Intravenous Q12H  . vitamin B-12  500 mcg Oral Daily   Continuous Infusions:     LOS: 2 days    Time spent: 92min    Domenic Polite, MD [facility] Pager [phone]  If 7PM-7AM, please contact night-coverage [email or web address] Password [email or web address] 01/13/2016, 12:25 PM

## 2016-01-13 NOTE — Plan of Care (Signed)
Problem: Skin Integrity: Goal: Risk for impaired skin integrity will decrease Outcome: Progressing Added a condom cath to help aid in keeping patient skin dry from his incontinence and MD just ordered IV lasix.

## 2016-01-14 LAB — BASIC METABOLIC PANEL
Anion gap: 12 (ref 5–15)
BUN: 28 mg/dL — AB (ref 6–20)
CHLORIDE: 99 mmol/L — AB (ref 101–111)
CO2: 25 mmol/L (ref 22–32)
Calcium: 9 mg/dL (ref 8.9–10.3)
Creatinine, Ser: 0.89 mg/dL (ref 0.61–1.24)
GFR calc Af Amer: >60 mL/min (ref >60)
GFR calc non Af Amer: >60 mL/min (ref >60)
Glucose, Bld: 162 mg/dL — ABNORMAL HIGH (ref 65–99)
POTASSIUM: 4.4 mmol/L (ref 3.5–5.1)
SODIUM: 136 mmol/L (ref 135–145)

## 2016-01-14 LAB — CBC
HEMATOCRIT: 37.5 % — AB (ref 39.0–52.0)
Hemoglobin: 11.5 g/dL — ABNORMAL LOW (ref 13.0–17.0)
MCH: 26.8 pg (ref 26.0–34.0)
MCHC: 30.7 g/dL (ref 30.0–36.0)
MCV: 87.4 fL (ref 78.0–100.0)
Platelets: 147 10*3/uL — ABNORMAL LOW (ref 150–400)
RBC: 4.29 MIL/uL (ref 4.22–5.81)
RDW: 17.1 % — AB (ref 11.5–15.5)
WBC: 9.3 10*3/uL (ref 4.0–10.5)

## 2016-01-14 MED ORDER — THROMBIN 5000 UNITS EX SOLR
CUTANEOUS | Status: AC
  Administered 2016-01-14: 5000 [IU] via TOPICAL
  Filled 2016-01-14: qty 5000

## 2016-01-14 MED ORDER — DILTIAZEM HCL ER COATED BEADS 180 MG PO CP24: 180.0000 mg | ORAL_CAPSULE | Freq: Every day | ORAL | Status: DC

## 2016-01-14 MED ORDER — DILTIAZEM HCL ER COATED BEADS 180 MG PO CP24
180.0000 mg | ORAL_CAPSULE | Freq: Every day | ORAL | Status: DC
  Administered 2016-01-14 – 2016-01-15 (×2): 180 mg via ORAL
  Filled 2016-01-14 (×2): qty 1

## 2016-01-14 MED ORDER — LEVALBUTEROL HCL 0.63 MG/3ML IN NEBU: 0.6300 mg | INHALATION_SOLUTION | Freq: Four times a day (QID) | RESPIRATORY_TRACT | Status: DC | PRN

## 2016-01-14 MED ORDER — LEVALBUTEROL HCL 0.63 MG/3ML IN NEBU
0.6300 mg | INHALATION_SOLUTION | Freq: Two times a day (BID) | RESPIRATORY_TRACT | Status: DC
Start: 2016-01-14 — End: 2016-01-15
  Administered 2016-01-14: 0.63 mg via RESPIRATORY_TRACT
  Filled 2016-01-14: qty 3

## 2016-01-14 MED ORDER — LEVALBUTEROL HCL 0.63 MG/3ML IN NEBU
0.6300 mg | INHALATION_SOLUTION | Freq: Three times a day (TID) | RESPIRATORY_TRACT | Status: DC
  Administered 2016-01-14: 0.63 mg via RESPIRATORY_TRACT
  Filled 2016-01-14: qty 3

## 2016-01-14 NOTE — Progress Notes (Signed)
PROGRESS NOTE    [name]  [id number] DOB: 18-Jul-1932 DOA: 01/11/2016 PCP: [name], MD  Brief Narrative: [name] is a 82 y.o. male  With h/o afib on coumadin, h/o cva with mild residual right sided weakness, h/o copd presented to [place] after he became extremely short of breath when he was walking.ED course: he is in afib/rvr upon arrival, bp elevated, no hypoxia, cbc unremarkable, bmp with k3.1, lactic acid 3.64, troponin negative. CTA + left sided PE with right heart strain, he was started on IV heparin, cardizem gtt and admitted to SDU  Assessment & Plan:   Acute PE with right heart strain while on coumadin: with subtherapeutic INR -Risk factors; sedentary lifestyle, age, PSA elevated need Urology Eval -Review chart INR has been subtherapeutic on many occasions -ECHO with normal EF and mod RV enlargement, venous doppler -no DVT -s/p heparin drip, discussed with wife and daughter and transitioned to Oral xarelto -hemodynamically stable now -mildly elevated troponin due to demand,  ECHO with normal wall motion, EF and enlarged RV -ambulate, OOB  Lactic acidosis;  -due to acute PE, vol depletion, no evidence of infection. -no need for Abx now -improved  Afib/RVR:  Not on rate control agent at home, Heart rate improved, off cardizem drip,  -change to Cardizem CD today -now on Xarelto as above -appeared vol overloaded, lasix x1 given   HTN;  -stable, continue cardizem, imdur  COPD: exacerbation -active wheezing-improving, continue prn nebs -scheduled xopenex, continue Iv solumedrol today and change to prednisone tomorrow  H/o CVA -With residual L sided weakness and facial droop -on xarelto  Elevated PSA -needs Urology FU  FTT, will need PT eval   DVT prophylaxis: xarelto Code Status: DNR Family Communication:  called and d/w wife 8/11 and 8/12 Disposition Plan: will likely need SNF in 2days   Subjective: Still with dyspnea,  imrpoving now  Objective: Vitals:   01/14/16 0010 01/14/16 0516 01/14/16 0758 01/14/16 0845  BP: 136/89 (!) 151/84 (!) 149/70   Pulse: 87 85    Resp: 16 18    Temp: 98 F (36.7 C) 98.7 F (37.1 C) 98.7 F (37.1 C)   TempSrc: Oral Oral Oral   SpO2: 95% 93% 95% 93%  Weight:  66.2 kg (145 lb 14.4 oz)    Height:        Intake/Output Summary (Last 24 hours) at 01/14/16 1126 Last data filed at 01/14/16 0809  Gross per 24 hour  Intake              600 ml  Output             2475 ml  Net            -1875 ml   Filed Weights   01/12/16 0400 01/13/16 0449 01/14/16 0516  Weight: 68.5 kg (151 lb) 69.7 kg (153 lb 9.6 oz) 66.2 kg (145 lb 14.4 oz)    Examination:  General exam:  Elderly, chronically ill-appearing, right facial droop noted Respiratory system: expiratory wheezes and basil;ar crackles-improving Cardiovascular system: S1 & S2 heard, RRR. No JVD, murmurs, rubs, gallops or clicks. No pedal edema. Gastrointestinal system: Abdomen is nondistended, soft and nontender. No organomegaly or masses felt. Normal bowel sounds heard. Central nervous system: Alert and oriented. Left hemiplegia, facial droop Extremities: Symmetric 5 x 5 power. Skin: No rashes, lesions or ulcers Psychiatry: Judgement and insight appear normal. Mood & affect appropriate.     Data Reviewed: I have personally reviewed  following labs and imaging studies  CBC:  Recent Labs Lab 01/11/16 1608 01/11/16 1613 01/12/16 0212 01/13/16 0455 01/14/16 0337  WBC 4.1  --  6.1 12.0* 9.3  NEUTROABS 2.7  --   --   --   --   HGB 14.3 16.0 11.5* 11.8* 11.5*  HCT 45.8 47.0 37.7* 38.6* 37.5*  MCV 88.4  --  87.9 88.9 87.4  PLT 139*  --  119* 145* Q000111Q*   Basic Metabolic Panel:  Recent Labs Lab 01/11/16 1608 01/11/16 1613 01/12/16 0212 01/13/16 0455 01/14/16 0337  NA 141 144 139 139 136  K 3.0* 3.1* 3.9 4.7 4.4  CL 104 102 110 104 99*  CO2 27  --  18* 29 25  GLUCOSE 161* 155* 184* 90 162*  BUN 11 13 13 18   28*  CREATININE 1.10 1.00 1.08 0.98 0.89  CALCIUM 9.1  --  8.2* 8.7* 9.0  MG  --   --  1.8  --   --    GFR: Estimated Creatinine Clearance: 61 mL/min (by C-G formula based on SCr of 0.89 mg/dL). Liver Function Tests:  Recent Labs Lab 01/11/16 1608  AST 26  ALT 17  ALKPHOS 58  BILITOT 0.6  PROT 7.3  ALBUMIN 4.1   No results for input(s): LIPASE, AMYLASE in the last 168 hours. No results for input(s): AMMONIA in the last 168 hours. Coagulation Profile:  Recent Labs Lab 01/10/16 01/11/16 1608  INR 2.0 1.95   Cardiac Enzymes:  Recent Labs Lab 01/11/16 2103 01/12/16 0212 01/12/16 1135  TROPONINI 0.49* 0.46* 0.32*   BNP (last 3 results) No results for input(s): PROBNP in the last 8760 hours. HbA1C: No results for input(s): HGBA1C in the last 72 hours. CBG: No results for input(s): GLUCAP in the last 168 hours. Lipid Profile: No results for input(s): CHOL, HDL, LDLCALC, TRIG, CHOLHDL, LDLDIRECT in the last 72 hours. Thyroid Function Tests: No results for input(s): TSH, T4TOTAL, FREET4, T3FREE, THYROIDAB in the last 72 hours. Anemia Panel: No results for input(s): VITAMINB12, FOLATE, FERRITIN, TIBC, IRON, RETICCTPCT in the last 72 hours. Urine analysis:    Component Value Date/Time   COLORURINE YELLOW 01/12/2016 0530   APPEARANCEUR CLEAR 01/12/2016 0530   LABSPEC 1.015 01/12/2016 0530   PHURINE 5.5 01/12/2016 0530   GLUCOSEU NEGATIVE 01/12/2016 0530   HGBUR NEGATIVE 01/12/2016 0530   BILIRUBINUR NEGATIVE 01/12/2016 0530   BILIRUBINUR 1+ 05/11/2015 1420   KETONESUR 15 (A) 01/12/2016 0530   PROTEINUR NEGATIVE 01/12/2016 0530   UROBILINOGEN 0.2 05/11/2015 1420   UROBILINOGEN 0.2 11/16/2012 1237   NITRITE NEGATIVE 01/12/2016 0530   LEUKOCYTESUR NEGATIVE 01/12/2016 0530   Sepsis Labs: @LABRCNTIP (procalcitonin:4,lacticidven:4)  ) Recent Results (from the past 240 hour(s))  Blood culture (routine x 2)     Status: None (Preliminary result)   Collection Time:  01/11/16  4:55 PM  Result Value Ref Range Status   Specimen Description BLOOD RIGHT FOREARM  Final   Special Requests BOTTLES DRAWN AEROBIC AND ANAEROBIC 5CC  Final   Culture NO GROWTH 2 DAYS  Final   Report Status PENDING  Incomplete  Blood culture (routine x 2)     Status: None (Preliminary result)   Collection Time: 01/11/16  5:21 PM  Result Value Ref Range Status   Specimen Description BLOOD RIGHT ANTECUBITAL  Final   Special Requests BOTTLES DRAWN AEROBIC AND ANAEROBIC 5CC  Final   Culture NO GROWTH 2 DAYS  Final   Report Status PENDING  Incomplete  MRSA  PCR Screening     Status: None   Collection Time: 01/11/16  8:52 PM  Result Value Ref Range Status   MRSA by PCR NEGATIVE NEGATIVE Final    Comment:        The GeneXpert MRSA Assay (FDA approved for NASAL specimens only), is one component of a comprehensive MRSA colonization surveillance program. It is not intended to diagnose MRSA infection nor to guide or monitor treatment for MRSA infections.   Urine culture     Status: None   Collection Time: 01/12/16  5:30 AM  Result Value Ref Range Status   Specimen Description URINE, CLEAN CATCH  Final   Special Requests NONE  Final   Culture NO GROWTH  Final   Report Status 01/13/2016 FINAL  Final         Radiology Studies: No results found.      Scheduled Meds: . atorvastatin  10 mg Oral q1800  . diltiazem  180 mg Oral Daily  . dutasteride  0.5 mg Oral Daily  . gabapentin  300 mg Oral TID  . isosorbide mononitrate  30 mg Oral QHS  . levalbuterol  0.63 mg Nebulization TID  . methylPREDNISolone (SOLU-MEDROL) injection  60 mg Intravenous Q12H  . mirtazapine  7.5 mg Oral QHS  . potassium chloride  20 mEq Oral Daily  . rivaroxaban  15 mg Oral BID WC   Followed by  . [START ON 02/03/2016] rivaroxaban  20 mg Oral Q supper  . rivastigmine  4.6 mg Transdermal Daily  . saccharomyces boulardii  250 mg Oral q morning - 10a  . sodium chloride flush  3 mL Intravenous Q12H    . thrombin   Topical STAT  . vitamin B-12  500 mcg Oral Daily   Continuous Infusions:     LOS: 3 days    Time spent: 8min    Domenic Polite, MD [facility] Pager [phone]  If 7PM-7AM, please contact night-coverage [email or web address] Password [facility] 01/14/2016, 11:26 AM

## 2016-01-14 NOTE — Evaluation (Signed)
Physical Therapy Evaluation Patient Details Name: [name] MRN: [id number] DOB: 1932/09/23 Today's Date: 01/14/2016   History of Present Illness  Pt is a 82 y/o M who presented with SOB on exertion.  In a-fib with RVR upon arrival, BP elevated.  CTA + Lt sided PE with Rt heart strain.  Pt's PMH includes CVA, COPD.    Clinical Impression  Pt admitted with above diagnosis. Pt currently with functional limitations due to the deficits listed below (see PT Problem List). PTA pt required assist for ADLs, IADLs, but was ambulating without AD.  He currently requires mod +2 assist for transfers due to posterior bias and fatigue.  Wife unable to provide the physical assist pt currently requires.  Pt will benefit from skilled PT to increase their independence and safety with mobility to allow discharge to the venue listed below.      Follow Up Recommendations SNF;Supervision/Assistance - 24 hour    Equipment Recommendations  None recommended by PT    Recommendations for Other Services OT consult     Precautions / Restrictions Precautions Precautions: Fall Restrictions Weight Bearing Restrictions: No      Mobility  Bed Mobility Overal bed mobility: Needs Assistance Bed Mobility: Supine to Sit     Supine to sit: Min guard;HOB elevated     General bed mobility comments: Increased time and effort with use of bed rail  Transfers Overall transfer level: Needs assistance Equipment used: Rolling walker (2 wheeled) Transfers: Sit to/from [facility] Sit to Stand: Mod assist;+2 physical assistance Stand pivot transfers: Mod assist;+2 physical assistance;+2 safety/equipment       General transfer comment: Assist to boost to standing and to steady, partial Rt knee buckle upon standing.  Strong posterior lean pivoting to chair with directional cues.  Assist for controlled descent to chair.   Ambulation/Gait             General Gait Details: deferred for  pt/therapist safety  Stairs            Wheelchair Mobility    Modified Rankin (Stroke Patients Only)       Balance Overall balance assessment: Needs assistance Sitting-balance support: No upper extremity supported;Feet supported Sitting balance-Leahy Scale: Fair     Standing balance support: Bilateral upper extremity supported;During functional activity Standing balance-Leahy Scale: Poor Standing balance comment: Relies on RW and physical assist for support                             Pertinent Vitals/Pain Pain Assessment: No/denies pain    Home Living Family/patient expects to be discharged to:: Skilled nursing facility Living Arrangements: Spouse/significant other Available Help at Discharge: Family;Available 24 hours/day Type of Home: House Home Access: Ramped entrance     Home Layout: One level Home Equipment: Walker - 2 wheels;Wheelchair - manual;Grab bars - tub/shower;Bedside commode      Prior Function Level of Independence: Needs assistance   Gait / Transfers Assistance Needed: Wife assists with tub transfers.  Pt ambulating without AD PTA with no falls in the past year.  ADL's / Homemaking Assistance Needed: Assist from wife for bathing, dressing, cooking, cleaning, driving.        Hand Dominance   Dominant Hand: Right    Extremity/Trunk Assessment   Upper Extremity Assessment: Generalized weakness           Lower Extremity Assessment: RLE deficits/detail RLE Deficits / Details: strength grossly 4/5  Cervical / Trunk Assessment: Kyphotic  Communication   Communication: HOH  Cognition Arousal/Alertness: Awake/alert Behavior During Therapy: WFL for tasks assessed/performed Overall Cognitive Status: History of cognitive impairments - at baseline                      General Comments General comments (skin integrity, edema, etc.): SpO2 remained at or above 92% on RA    Exercises        Assessment/Plan     PT Assessment Patient needs continued PT services  PT Diagnosis Difficulty walking;Generalized weakness   PT Problem List Decreased strength;Decreased activity tolerance;Decreased balance;Decreased mobility;Decreased knowledge of use of DME;Decreased safety awareness;Decreased cognition  PT Treatment Interventions DME instruction;Gait training;Functional mobility training;Therapeutic activities;Therapeutic exercise;Balance training;Cognitive remediation;Patient/family education   PT Goals (Current goals can be found in the Care Plan section) Acute Rehab PT Goals Patient Stated Goal: agreeable to rehab before home PT Goal Formulation: With patient/family Time For Goal Achievement: 01/28/16 Potential to Achieve Goals: Good    Frequency Min 2X/week   Barriers to discharge Decreased caregiver support Wife unable to provide the physical assist pt currently requires    Co-evaluation               End of Session Equipment Utilized During Treatment: Gait belt Activity Tolerance: Patient limited by fatigue Patient left: in chair;with call bell/phone within reach;with chair alarm set;with family/visitor present Nurse Communication: Mobility status         Time: [id number] PT Time Calculation (min) (ACUTE ONLY): 27 min   Charges:   PT Evaluation $PT Eval Moderate Complexity: 1 Procedure PT Treatments $Therapeutic Activity: 8-22 mins   PT G Codes:        Collie Siad PT, DPT  Pager: [phone] Phone: [phone] 01/14/2016, 2:16 PM

## 2016-01-15 LAB — CBC
HCT: 33.5 % — ABNORMAL LOW (ref 39.0–52.0)
Hemoglobin: 10.4 g/dL — ABNORMAL LOW (ref 13.0–17.0)
MCH: 26.9 pg (ref 26.0–34.0)
MCHC: 31 g/dL (ref 30.0–36.0)
MCV: 86.8 fL (ref 78.0–100.0)
PLATELETS: 165 10*3/uL (ref 150–400)
RBC: 3.86 MIL/uL — ABNORMAL LOW (ref 4.22–5.81)
RDW: 17 % — AB (ref 11.5–15.5)
WBC: 10.2 10*3/uL (ref 4.0–10.5)

## 2016-01-15 LAB — BASIC METABOLIC PANEL
Anion gap: 5 (ref 5–15)
BUN: 38 mg/dL — AB (ref 6–20)
CALCIUM: 8.9 mg/dL (ref 8.9–10.3)
CO2: 28 mmol/L (ref 22–32)
CREATININE: 1 mg/dL (ref 0.61–1.24)
Chloride: 104 mmol/L (ref 101–111)
GFR calc Af Amer: >60 mL/min (ref >60)
GLUCOSE: 168 mg/dL — AB (ref 65–99)
Potassium: 4.2 mmol/L (ref 3.5–5.1)
SODIUM: 137 mmol/L (ref 135–145)

## 2016-01-15 LAB — LUPUS ANTICOAGULANT PANEL
DRVVT: 55.1 s — AB (ref 0.0–47.0)
PTT LA: 56.5 s — AB (ref 0.0–51.9)

## 2016-01-15 LAB — PTT-LA MIX: PTT-LA MIX: 43.6 s (ref 0.0–48.9)

## 2016-01-15 LAB — DRVVT MIX: dRVVT Mix: 39.1 s (ref 0.0–47.0)

## 2016-01-15 LAB — PTT-LA INCUB MIX: PTT-LA Incub Mix: 46.5 s (ref 0.0–48.9)

## 2016-01-15 MED ORDER — GABAPENTIN 300 MG PO CAPS: 300.0000 mg | ORAL_CAPSULE | Freq: Three times a day (TID) | ORAL | Status: DC

## 2016-01-15 MED ORDER — PREDNISONE 20 MG PO TABS: 40.0000 mg | ORAL_TABLET | Freq: Every day | ORAL | Status: DC

## 2016-01-15 MED ORDER — RIVAROXABAN 20 MG PO TABS: 20.0000 mg | ORAL_TABLET | Freq: Every day | ORAL | Status: DC

## 2016-01-15 MED ORDER — RIVAROXABAN 15 MG PO TABS: 15.0000 mg | ORAL_TABLET | Freq: Two times a day (BID) | ORAL | Status: DC

## 2016-01-15 MED ORDER — DILTIAZEM HCL ER COATED BEADS 180 MG PO CP24: 180.0000 mg | ORAL_CAPSULE | Freq: Every day | ORAL | Status: DC

## 2016-01-15 MED ORDER — PREDNISONE 20 MG PO TABS
50.0000 mg | ORAL_TABLET | Freq: Every day | ORAL | Status: DC
  Administered 2016-01-15: 50 mg via ORAL
  Filled 2016-01-15: qty 2

## 2016-01-15 MED ORDER — ATORVASTATIN CALCIUM 10 MG PO TABS: 10.0000 mg | ORAL_TABLET | Freq: Every day | ORAL | Status: DC

## 2016-01-15 NOTE — Progress Notes (Signed)
S/W [facility] @ [employer] [facility] # [phone]   XARELTO 15 MG BID X 21 DAYS   THEN 20 MG DAILY   COVER- YES                          YES  CO-PAY- $ 40.00      Q/L               $ 40.00  TIER- 2 DRUG                         2 DRUG  PRIOR APPROVAL - YES # [phone]     YES                     FAX # [phone]  PHARMACY : [facility]

## 2016-01-15 NOTE — Discharge Summary (Signed)
Physician Discharge Summary  [name] [id number] DOB: 10/13/1932 DOA: 01/11/2016  PCP: Darrell Post, MD  Admit date: 01/11/2016 Discharge date: 01/15/2016  Time spent: 45 minutes  Recommendations for Outpatient Follow-up:  1. Elevated PSA, needs Urology evaluation and management 2. PCP In 1 week, warfarin stopped and started on Xarelto  Discharge Diagnoses:    Acute Pulmonary emboli   Hereditary and idiopathic peripheral neuropathy   Essential hypertension   Coronary atherosclerosis   FIBRILLATION, ATRIAL   Cerebral artery occlusion with cerebral infarction [facility])   H/O: CVA (cerebrovascular accident)   COPD exacerbation ([place])   Acute kidney injury ([place])   Moderate protein-calorie malnutrition ([place])   Stage III chronic kidney disease   Anemia of chronic disease   Failure to thrive in adult   Memory loss   Discharge Condition: stable  Diet recommendation: healthy healthy diet  Filed Weights   01/13/16 0449 01/14/16 0516 01/15/16 0520  Weight: 69.7 kg (153 lb 9.6 oz) 66.2 kg (145 lb 14.4 oz) 64.4 kg (142 lb)    History of present illness:  [name] a 83 y.o.male With h/o afib on coumadin, h/o cva with mild residual right sided weakness, h/o copd presented to [facility] after he became extremely short of breath when he was walking.ED course: he is in afib/rvr upon arrival, bp elevated, no hypoxia, cbc unremarkable, bmp with k3.1, lactic acid 3.64, troponin negative. CTA + left sided PEwith right heart strain, he was started on IV heparin, cardizem gtt.  Hospital Course:  Acute PE with right heart strainwhile on coumadin for Afib with subtherapeutic INR in 1.6-1.9 range recently -Risk factors; sedentary lifestyle, age, PSA elevated need Urology Eval -Review chart INR has been subtherapeutic on many occasions -ECHO with normal EF and mod RV enlargement, venous doppler -no DVT -s/p heparin drip, discussed with wife and daughter and transitioned to  Oral xarelto -hemodynamically stable now -mildly elevated troponin due to demand,  ECHO with normal wall motion, EF and enlarged RV -ambulate, OOB -plan for rehab  Lactic acidosis;  -due to acute PE, vol depletion, no evidence of infection. -no need for Abx now -improved  Afib/RVR:  Not on rate control agents at home, -Heart rate improved, off cardizem drip,  -changed to Cardizem CD , HR improved -now on Xarelto as above -appeared vol overloaded 2 days ago, lasix x1 given, now euvolemic  HTN;  -stable, continue cardizem, imdur  COPD: exacerbation -with dyspnea and wheezing noted -treated with IV solumedrol, nebs, now improved -changed to prednisone taper  H/o CVA -With residual L sided weakness and facial droop -on xarelto  Elevated PSA -needs Urology FU  Discharge Exam: Vitals:   01/15/16 1000 01/15/16 1307  BP: (!) 118/57 (!) 136/59  Pulse:  62  Resp:  18  Temp:  98.3 F (36.8 C)    General: AAOx3 Cardiovascular: S1S2/RRR Respiratory: improved air movement  Discharge Instructions   Discharge Instructions    Diet - low sodium heart healthy    Complete by:  As directed   Increase activity slowly    Complete by:  As directed     Current Discharge Medication List    START taking these medications   Details  atorvastatin (LIPITOR) 10 MG tablet Take 1 tablet (10 mg total) by mouth daily at 6 PM.    diltiazem (CARDIZEM CD) 180 MG 24 hr capsule Take 1 capsule (180 mg total) by mouth daily.    gabapentin (NEURONTIN) 300 MG capsule Take 1 capsule (  300 mg total) by mouth 3 (three) times daily.    predniSONE (DELTASONE) 20 MG tablet Take 2 tablets (40 mg total) by mouth daily with breakfast. Take 40mg  for 2days then 20mg  for 2days then STOP    !! Rivaroxaban (XARELTO) 15 MG TABS tablet Take 1 tablet (15 mg total) by mouth 2 (two) times daily with a meal.    !! rivaroxaban (XARELTO) 20 MG TABS tablet Take 1 tablet (20 mg total) by mouth daily with  supper.     !! - Potential duplicate medications found. Please discuss with provider.    CONTINUE these medications which have NOT CHANGED   Details  albuterol (PROVENTIL HFA;VENTOLIN HFA) 108 (90 BASE) MCG/ACT inhaler Inhale 2 puffs into the lungs every 6 (six) hours as needed for wheezing or shortness of breath. Qty: 1 Inhaler, Refills: 2    dutasteride (AVODART) 0.5 MG capsule TAKE (1) CAPSULE DAILY Qty: 30 capsule, Refills: 0    furosemide (LASIX) 20 MG tablet TAKE ONE TABLET TWICE A DAY AS NEEDED FOR EDEMA Qty: 180 tablet, Refills: 1    ipratropium (ATROVENT) 0.02 % nebulizer solution NEBULIZE 1 VAIL 4 TIMES A DAY AS DIRECTED Qty: 312.5 mL, Refills: 1    isosorbide mononitrate (IMDUR) 30 MG 24 hr tablet Take 1 tablet (30 mg total) by mouth daily. Qty: 30 tablet, Refills: 11    levalbuterol (XOPENEX) 0.63 MG/3ML nebulizer solution NEBULIZE 1 VIAL (WITH IPRATROPIUM) TWICE A DAY AS NEEDED. MAY USE EXTR A EVERY 4 HOURS IF NEEDED Qty: 216 mL, Refills: 5    mirtazapine (REMERON) 15 MG tablet TAKE 1 TABLET DAILY. Qty: 90 tablet, Refills: 2    mupirocin ointment (BACTROBAN) 2 % Apply 1 application topically 2 (two) times daily.    nitroGLYCERIN (NITROSTAT) 0.4 MG SL tablet Place 1 tablet (0.4 mg total) under the tongue every 5 (five) minutes x 3 doses as needed for chest pain. Qty: 25 tablet, Refills: 3    potassium chloride (K-DUR) 10 MEQ tablet TAKE (2) TABLETS TWICE DAILY. Qty: 120 tablet, Refills: 5    rivastigmine (EXELON) 4.6 mg/24hr PLACE 1 PATCH ONTO SKIN DAILY AS DIRECTED Qty: 30 patch, Refills: 5    saccharomyces boulardii (FLORASTOR) 250 MG capsule Take 250 mg by mouth every morning.     vitamin B-12 (CYANOCOBALAMIN) 500 MCG tablet Take 500 mcg by mouth daily.      STOP taking these medications     simvastatin (ZOCOR) 20 MG tablet      warfarin (COUMADIN) 1 MG tablet      gabapentin (NEURONTIN) 600 MG tablet        Allergies  Allergen Reactions  . Tape  Other (See Comments)    SKIN WILL TEAR IF TAPED (MUST BE LIGHT & BRIEF OR USE COBAN WRAP, PLEASE!!)  . Penicillins Hives    Has patient had a PCN reaction causing immediate rash, facial/tongue/throat swelling, SOB or lightheadedness with hypotension: Yes Has patient had a PCN reaction causing severe rash involving mucus membranes or skin necrosis: No Has patient had a PCN reaction that required hospitalization: Unknown Has patient had a PCN reaction occurring within the last 10 years: No If all of the above answers are "NO", then may proceed with Cephalosporin use.    Follow-up Information    Darrell Post, MD. Schedule an appointment as soon as possible for a visit in 1 week(s).   Specialty:  Family Medicine Contact information: [address] [place] [address] [phone]  The results of significant diagnostics from this hospitalization (including imaging, microbiology, ancillary and laboratory) are listed below for reference.    Significant Diagnostic Studies: Ct Angio Chest Pe W Or Wo Contrast  Result Date: 01/11/2016 CLINICAL DATA:  Pt having SOB after walking from car to his house today. Pt denies chest pain EXAM: CT ANGIOGRAPHY CHEST WITH CONTRAST TECHNIQUE: Multidetector CT imaging of the chest was performed using the standard protocol during bolus administration of intravenous contrast. Multiplanar CT image reconstructions and MIPs were obtained to evaluate the vascular anatomy. CONTRAST:  100 mL of Isovue 370 intravenous contrast COMPARISON:  Current chest radiograph FINDINGS: Angiographic study: There are eccentric pulmonary emboli on the left. Thrombus extends from the peripheral left main pulmonary artery into the origins of the upper and lower lobe segmental branches. Thrombus is nonocclusive. There is no convincing pulmonary embolus on the right. All the main pulmonary arteries are enlarged, right measuring 4.3 cm in diameter and left measuring  3.8 cm. Main pulmonary artery is also dilated but less prominent. Aorta is normal caliber. There is no dissection. Atherosclerotic calcifications are noted along the thoracic aorta and the branch vessels. There also moderate coronary artery calcifications. For Positive for acute PE with CT evidence of right heart strain (RV/LV Ratio = 1.1) consistent with at least submassive (intermediate risk) PE. The presence of right heart strain has been associated with an increased risk of morbidity and mortality. Please activate Code PE by paging [phone]. Neck base and axilla:  No mass or adenopathy. Mediastinum and hila: Heart is mildly enlarged with a prominence of the atria. No mediastinal or hilar masses or pathologically enlarged lymph nodes. Lungs and pleura: There is atelectasis with volume loss in the left lower lobe. Milder areas of dependent reticular opacity noted there also likely due to atelectasis. There changes of emphysema. No evidence of pneumonia or edema. No mass or suspicious nodule. No pleural effusion or pneumothorax. Limited upper abdomen: Moderate to large hiatal hernia. Probable fatty infiltration of the liver. Liver shows morphologic changes that raise suspicion for cirrhosis. No acute findings. Musculoskeletal: Status [name] median sternotomy for CABG surgery. There are degenerative changes of the visualized spine. No osteoblastic or osteolytic lesions. Review of the MIP images confirms the above findings. IMPRESSION: 1. Left-sided pulmonary emboli, nonocclusive. RV LV ratio suggest right heart strain. 2. The partial left lower lobe atelectasis. Emphysema. No evidence of pneumonia or pulmonary edema. 3. Enlarged pulmonary arteries consistent with pulmonary hypertension. Critical Value/emergent results were called by telephone at the time of interpretation on 01/11/2016 at 6:15 pm to Dr. Ellender Hose, who verbally acknowledged these results. Electronically Signed   By: Lajean Manes M.D.   On: 01/11/2016  18:16   Dg Chest Portable 1 View  Result Date: 01/11/2016 CLINICAL DATA:  Respiratory distress, shortness of Breath EXAM: PORTABLE CHEST 1 VIEW COMPARISON:  01/27/2015 FINDINGS: Cardiomegaly again noted. Status [name] median sternotomy. Prominent size pulmonary artery bilaterally again noted suspicious for chronic portal hypertension. Large hiatal hernia again noted. Mild hyperinflation. No infiltrate or pulmonary edema. IMPRESSION: No infiltrate or pulmonary edema. Stable prominent central pulmonary artery bilaterally. Large hiatal hernia again noted. Electronically Signed   By: Lahoma Crocker M.D.   On: 01/11/2016 15:21    Microbiology: Recent Results (from the past 240 hour(s))  Blood culture (routine x 2)     Status: None (Preliminary result)   Collection Time: 01/11/16  4:55 PM  Result Value Ref Range Status   Specimen Description BLOOD RIGHT FOREARM  Final   Special Requests BOTTLES DRAWN AEROBIC AND ANAEROBIC 5CC  Final   Culture NO GROWTH 4 DAYS  Final   Report Status PENDING  Incomplete  Blood culture (routine x 2)     Status: None (Preliminary result)   Collection Time: 01/11/16  5:21 PM  Result Value Ref Range Status   Specimen Description BLOOD RIGHT ANTECUBITAL  Final   Special Requests BOTTLES DRAWN AEROBIC AND ANAEROBIC 5CC  Final   Culture NO GROWTH 4 DAYS  Final   Report Status PENDING  Incomplete  MRSA PCR Screening     Status: None   Collection Time: 01/11/16  8:52 PM  Result Value Ref Range Status   MRSA by PCR NEGATIVE NEGATIVE Final    Comment:        The GeneXpert MRSA Assay (FDA approved for NASAL specimens only), is one component of a comprehensive MRSA colonization surveillance program. It is not intended to diagnose MRSA infection nor to guide or monitor treatment for MRSA infections.   Urine culture     Status: None   Collection Time: 01/12/16  5:30 AM  Result Value Ref Range Status   Specimen Description URINE, CLEAN CATCH  Final   Special Requests  NONE  Final   Culture NO GROWTH  Final   Report Status 01/13/2016 FINAL  Final     Labs: Basic Metabolic Panel:  Recent Labs Lab 01/11/16 1608 01/11/16 1613 01/12/16 0212 01/13/16 0455 01/14/16 0337 01/15/16 0438  NA 141 144 139 139 136 137  K 3.0* 3.1* 3.9 4.7 4.4 4.2  CL 104 102 110 104 99* 104  CO2 27  --  18* 29 25 28   GLUCOSE 161* 155* 184* 90 162* 168*  BUN 11 13 13 18  28* 38*  CREATININE 1.10 1.00 1.08 0.98 0.89 1.00  CALCIUM 9.1  --  8.2* 8.7* 9.0 8.9  MG  --   --  1.8  --   --   --    Liver Function Tests:  Recent Labs Lab 01/11/16 1608  AST 26  ALT 17  ALKPHOS 58  BILITOT 0.6  PROT 7.3  ALBUMIN 4.1   No results for input(s): LIPASE, AMYLASE in the last 168 hours. No results for input(s): AMMONIA in the last 168 hours. CBC:  Recent Labs Lab 01/11/16 1608 01/11/16 1613 01/12/16 0212 01/13/16 0455 01/14/16 0337 01/15/16 0430  WBC 4.1  --  6.1 12.0* 9.3 10.2  NEUTROABS 2.7  --   --   --   --   --   HGB 14.3 16.0 11.5* 11.8* 11.5* 10.4*  HCT 45.8 47.0 37.7* 38.6* 37.5* 33.5*  MCV 88.4  --  87.9 88.9 87.4 86.8  PLT 139*  --  119* 145* 147* 165   Cardiac Enzymes:  Recent Labs Lab 01/11/16 2103 01/12/16 0212 01/12/16 1135  TROPONINI 0.49* 0.46* 0.32*   BNP: BNP (last 3 results)  Recent Labs  01/11/16 1608  BNP 86.2    ProBNP (last 3 results) No results for input(s): PROBNP in the last 8760 hours.  CBG: No results for input(s): GLUCAP in the last 168 hours.     SignedDomenic Polite MD.  [facility] 01/15/2016, 3:21 PM

## 2016-01-15 NOTE — Clinical Social Work Placement (Signed)
   CLINICAL SOCIAL WORK PLACEMENT  NOTE  Date:  01/15/2016  Patient Details  Name: [name] MRN: [id number] Date of Birth: 21-Oct-1932  Clinical Social Work is seeking post-discharge placement for this patient at the [place] level of care (*CSW will initial, date and re-position this form in  chart as items are completed):  Yes   Patient/family provided with [facility] Work Department's list of facilities offering this level of care within the geographic area requested by the patient (or if unable, by the patient's family).  Yes   Patient/family informed of their freedom to choose among providers that offer the needed level of care, that participate in Medicare, Medicaid or managed care program needed by the patient, have an available bed and are willing to accept the patient.  Yes   Patient/family informed of [facility]'s ownership interest in [facility] and [facility], as well as of the fact that they are under no obligation to receive care at these facilities.  PASRR submitted to EDS on       PASRR number received on       Existing PASRR number confirmed on 01/15/16     FL2 transmitted to all facilities in geographic area requested by pt/family on 01/15/16     FL2 transmitted to all facilities within larger geographic area on       Patient informed that his/her managed care company has contracts with or will negotiate with certain facilities, including the following:        Yes   Patient/family informed of bed offers received.  Patient chooses bed at [facility]     Physician recommends and patient chooses bed at [facility]    Patient to be transferred to [facility] on 01/15/16.  Patient to be transferred to facility by Ambulance     Patient family notified on 01/15/16 of transfer.  Name of family member notified:  Patient spouse at bedside     PHYSICIAN Please sign FL2, Please sign DNR     Additional Comment:     Barbette Or, [place]

## 2016-01-15 NOTE — Progress Notes (Signed)
PROGRESS NOTE    [name]  [id number] DOB: 03/07/1933 DOA: 01/11/2016 PCP: Darrell Post, MD  Brief Narrative: [name] is a 82 y.o. male  With h/o afib on coumadin, h/o cva with mild residual right sided weakness, h/o copd presented to [facility] after he became extremely short of breath when he was walking.ED course: he is in afib/rvr upon arrival, bp elevated, no hypoxia, cbc unremarkable, bmp with k3.1, lactic acid 3.64, troponin negative. CTA + left sided PE with right heart strain, he was started on IV heparin, cardizem gtt and admitted to SDU  Assessment & Plan:   Acute PE with right heart strain while on coumadin: with subtherapeutic INR -Risk factors; sedentary lifestyle, age, PSA elevated need Urology Eval -Review chart INR has been subtherapeutic on many occasions -ECHO with normal EF and mod RV enlargement, venous doppler -no DVT -s/p heparin drip, discussed with wife and daughter and transitioned to Oral xarelto -hemodynamically stable now -mildly elevated troponin due to demand,  ECHO with normal wall motion, EF and enlarged RV -ambulate, OOB -plan for rehab  Lactic acidosis;  -due to acute PE, vol depletion, no evidence of infection. -no need for Abx now -improved  Afib/RVR:  Not on rate control agent at home, Heart rate improved, off cardizem drip,  -change to Cardizem CD , HR improved -now on Xarelto as above -appeared vol overloaded, lasix x1 given, now euvolemic  HTN;  -stable, continue cardizem, imdur  COPD: exacerbation -active wheezing-improving, continue prn nebs -scheduled xopenex, change Iv solumedrol to prednisone today  H/o CVA -With residual L sided weakness and facial droop -on xarelto  Elevated PSA -needs Urology FU  FTT, will need PT eval   DVT prophylaxis: xarelto Code Status: DNR Family Communication:  called and d/w wife 8/11 and 8/12 Disposition Plan: SNF today or tomorrow   Subjective: Still with  dyspnea, imrpoving now  Objective: Vitals:   01/15/16 0956 01/15/16 0958 01/15/16 1000 01/15/16 1307  BP:  (!) 118/57 (!) 118/57 (!) 136/59  Pulse: (!) 56   62  Resp: 18   18  Temp: 97.8 F (36.6 C)   98.3 F (36.8 C)  TempSrc: Oral   Oral  SpO2: 97%   98%  Weight:      Height:        Intake/Output Summary (Last 24 hours) at 01/15/16 1416 Last data filed at 01/15/16 1300  Gross per 24 hour  Intake              240 ml  Output              250 ml  Net              -10 ml   Filed Weights   01/13/16 0449 01/14/16 0516 01/15/16 0520  Weight: 69.7 kg (153 lb 9.6 oz) 66.2 kg (145 lb 14.4 oz) 64.4 kg (142 lb)    Examination:  General exam:  Elderly, chronically ill-appearing, right facial droop noted Respiratory system: expiratory wheezes and basil;ar crackles-improving Cardiovascular system: S1 & S2 heard, RRR. No JVD, murmurs, rubs, gallops or clicks. No pedal edema. Gastrointestinal system: Abdomen is nondistended, soft and nontender. No organomegaly or masses felt. Normal bowel sounds heard. Central nervous system: Alert and oriented. Left hemiplegia, facial droop Extremities: Symmetric 5 x 5 power. Skin: No rashes, lesions or ulcers Psychiatry: Judgement and insight appear normal. Mood & affect appropriate.     Data Reviewed: I have personally reviewed following labs  and imaging studies  CBC:  Recent Labs Lab 01/11/16 1608 01/11/16 1613 01/12/16 0212 01/13/16 0455 01/14/16 0337 01/15/16 0430  WBC 4.1  --  6.1 12.0* 9.3 10.2  NEUTROABS 2.7  --   --   --   --   --   HGB 14.3 16.0 11.5* 11.8* 11.5* 10.4*  HCT 45.8 47.0 37.7* 38.6* 37.5* 33.5*  MCV 88.4  --  87.9 88.9 87.4 86.8  PLT 139*  --  119* 145* 147* [id number]   Basic Metabolic Panel:  Recent Labs Lab 01/11/16 1608 01/11/16 1613 01/12/16 0212 01/13/16 0455 01/14/16 0337 01/15/16 0438  NA 141 144 139 139 136 137  K 3.0* 3.1* 3.9 4.7 4.4 4.2  CL 104 102 110 104 99* 104  CO2 27  --  18* 29 25 28     GLUCOSE 161* 155* 184* 90 162* 168*  BUN 11 13 13 18  28* 38*  CREATININE 1.10 1.00 1.08 0.98 0.89 1.00  CALCIUM 9.1  --  8.2* 8.7* 9.0 8.9  MG  --   --  1.8  --   --   --    GFR: Estimated Creatinine Clearance: 52.8 mL/min (by C-G formula based on SCr of 1 mg/dL). Liver Function Tests:  Recent Labs Lab 01/11/16 1608  AST 26  ALT 17  ALKPHOS 58  BILITOT 0.6  PROT 7.3  ALBUMIN 4.1   No results for input(s): LIPASE, AMYLASE in the last 168 hours. No results for input(s): AMMONIA in the last 168 hours. Coagulation Profile:  Recent Labs Lab 01/10/16 01/11/16 1608  INR 2.0 1.95   Cardiac Enzymes:  Recent Labs Lab 01/11/16 2103 01/12/16 0212 01/12/16 1135  TROPONINI 0.49* 0.46* 0.32*   BNP (last 3 results) No results for input(s): PROBNP in the last 8760 hours. HbA1C: No results for input(s): HGBA1C in the last 72 hours. CBG: No results for input(s): GLUCAP in the last 168 hours. Lipid Profile: No results for input(s): CHOL, HDL, LDLCALC, TRIG, CHOLHDL, LDLDIRECT in the last 72 hours. Thyroid Function Tests: No results for input(s): TSH, T4TOTAL, FREET4, T3FREE, THYROIDAB in the last 72 hours. Anemia Panel: No results for input(s): VITAMINB12, FOLATE, FERRITIN, TIBC, IRON, RETICCTPCT in the last 72 hours. Urine analysis:    Component Value Date/Time   COLORURINE YELLOW 01/12/2016 0530   APPEARANCEUR CLEAR 01/12/2016 0530   LABSPEC 1.015 01/12/2016 0530   PHURINE 5.5 01/12/2016 0530   GLUCOSEU NEGATIVE 01/12/2016 0530   HGBUR NEGATIVE 01/12/2016 0530   BILIRUBINUR NEGATIVE 01/12/2016 0530   BILIRUBINUR 1+ 05/11/2015 1420   KETONESUR 15 (A) 01/12/2016 0530   PROTEINUR NEGATIVE 01/12/2016 0530   UROBILINOGEN 0.2 05/11/2015 1420   UROBILINOGEN 0.2 11/16/2012 1237   NITRITE NEGATIVE 01/12/2016 0530   LEUKOCYTESUR NEGATIVE 01/12/2016 0530   Sepsis Labs: @LABRCNTIP (procalcitonin:4,lacticidven:4)  ) Recent Results (from the past 240 hour(s))  Blood culture  (routine x 2)     Status: None (Preliminary result)   Collection Time: 01/11/16  4:55 PM  Result Value Ref Range Status   Specimen Description BLOOD RIGHT FOREARM  Final   Special Requests BOTTLES DRAWN AEROBIC AND ANAEROBIC 5CC  Final   Culture NO GROWTH 3 DAYS  Final   Report Status PENDING  Incomplete  Blood culture (routine x 2)     Status: None (Preliminary result)   Collection Time: 01/11/16  5:21 PM  Result Value Ref Range Status   Specimen Description BLOOD RIGHT ANTECUBITAL  Final   Special Requests BOTTLES DRAWN AEROBIC AND  ANAEROBIC 5CC  Final   Culture NO GROWTH 3 DAYS  Final   Report Status PENDING  Incomplete  MRSA PCR Screening     Status: None   Collection Time: 01/11/16  8:52 PM  Result Value Ref Range Status   MRSA by PCR NEGATIVE NEGATIVE Final    Comment:        The GeneXpert MRSA Assay (FDA approved for NASAL specimens only), is one component of a comprehensive MRSA colonization surveillance program. It is not intended to diagnose MRSA infection nor to guide or monitor treatment for MRSA infections.   Urine culture     Status: None   Collection Time: 01/12/16  5:30 AM  Result Value Ref Range Status   Specimen Description URINE, CLEAN CATCH  Final   Special Requests NONE  Final   Culture NO GROWTH  Final   Report Status 01/13/2016 FINAL  Final         Radiology Studies: No results found.      Scheduled Meds: . atorvastatin  10 mg Oral q1800  . diltiazem  180 mg Oral Daily  . dutasteride  0.5 mg Oral Daily  . gabapentin  300 mg Oral TID  . isosorbide mononitrate  30 mg Oral QHS  . mirtazapine  7.5 mg Oral QHS  . potassium chloride  20 mEq Oral Daily  . predniSONE  50 mg Oral Q breakfast  . rivaroxaban  15 mg Oral BID WC   Followed by  . [START ON 02/03/2016] rivaroxaban  20 mg Oral Q supper  . rivastigmine  4.6 mg Transdermal Daily  . saccharomyces boulardii  250 mg Oral q morning - 10a  . sodium chloride flush  3 mL Intravenous Q12H  .  vitamin B-12  500 mcg Oral Daily   Continuous Infusions:     LOS: 4 days    Time spent: 17min    Domenic Polite, MD [facility] Pager [phone]  If 7PM-7AM, please contact night-coverage [email or web address] Password [email or web address] 01/15/2016, 2:16 PM

## 2016-01-15 NOTE — Clinical Social Work Note (Signed)
Clinical Social Work Assessment  Patient Details  Name: [name] MRN: [id number] Date of Birth: 11-Sep-1932  Date of referral:  01/15/16               Reason for consult:  Facility Placement                Permission sought to share information with:  Family Supports Permission granted to share information::  Yes, Verbal Permission Granted  Name::     [name]  Relationship::  Spouse  Contact Information:  [phone]  Housing/Transportation Living arrangements for the past 2 months:  [place] of Information:  Patient, Spouse Patient Interpreter Needed:  None Criminal Activity/Legal Involvement Pertinent to Current Situation/Hospitalization:  No - Comment as needed Significant Relationships:  Spouse Lives with:  Spouse Do you feel safe going back to the place where you live?  Yes Need for family participation in patient care:  Yes (Comment)  Care giving concerns:  Patient wife understanding of patient need for SNF and is hopeful for [facility] since it is close to their home.  Patient wife states that she wished there were additional options for placement in their area but understands options are limited in [facility].   Social Worker assessment / plan:  Holiday representative met with patient and spouse at bedside to offer support and discuss patient needs at discharge.  Patient states that he has been to [facility] in the past and would like to return if bed available.  CSW has contacted facility and awaiting return call to determine bed availability.  CSW remains available for support and to facilitate patient discharge needs once medically stable and bed ready.  Employment status:  Retired Nurse, adult PT Recommendations:  [place] / Referral to community resources:  [place]  Patient/Family's Response to care:  Patient and spouse agreeable with SNF placement and are  familiar with the routine upon discharge.  Patient and spouse verbalized understanding of CSW role and appreciation for support.  Patient/Family's Understanding of and Emotional Response to Diagnosis, Current Treatment, and Prognosis:  Patient and wife understanding of patient limitations and barriers and need for SNF placement.  Patient hopeful for short term stay and return home with wife.  Emotional Assessment Appearance:  Appears stated age Attitude/Demeanor/Rapport:   (Engaged but very hard of hearing) Affect (typically observed):  Appropriate, Calm Orientation:  Oriented to Self, Oriented to Place, Oriented to  Time, Oriented to Situation Alcohol / Substance use:  Not Applicable Psych involvement (Current and /or in the community):  No (Comment)  Discharge Needs  Concerns to be addressed:  No discharge needs identified Readmission within the last 30 days:  No Current discharge risk:  None Barriers to Discharge:  Continued Medical Work up, No Barriers Identified   Barbette Or, [place]

## 2016-01-15 NOTE — Progress Notes (Signed)
Occupational Therapy Evaluation Patient Details Name: [name] MRN: [id number] DOB: November 07, 1932 Today's Date: 01/15/2016    History of Present Illness Pt is a 82 y/o M who presented with SOB on exertion.  In a-fib with RVR upon arrival, BP elevated.  CTA + Lt sided PE with Rt heart strain.  Pt's PMH includes CVA, COPD.   Clinical Impression   PTA, pt lived at home with wife. Pt making good progress and currently requires min A for short distance ambulation @ RW level and ADL. Pt would benefit form rehab at SNF to facilitate safe D/C home.     Follow Up Recommendations  SNF;Supervision/Assistance - 24 hour    Equipment Recommendations  Other (comment) (TBA at SNF)    Recommendations for Other Services       Precautions / Restrictions Precautions Precautions: Fall Restrictions Weight Bearing Restrictions: No      Mobility Bed Mobility               General bed mobility comments: OOB in chair  Transfers Overall transfer level: Needs assistance Equipment used: Rolling walker (2 wheeled) Transfers: Sit to/from [facility] Sit to Stand: Min assist Stand pivot transfers: Min assist            Balance     Sitting balance-Leahy Scale: Fair       Standing balance-Leahy Scale: Poor                              ADL Overall ADL's : Needs assistance/impaired     Grooming: Minimal assistance;Standing Grooming Details (indicate cue type and reason): minA for standing balance.      Lower Body Bathing: Minimal assistance;Sit to/from stand   Upper Body Dressing : Minimal assistance;Sitting   Lower Body Dressing: Minimal assistance;Sit to/from stand   Toilet Transfer: Minimal assistance;RW;BSC;Ambulation     Toileting - Clothing Manipulation Details (indicate cue type and reason): foley     Functional mobility during ADLs: Minimal assistance;Rolling walker;Cueing for safety General ADL Comments: Able to walk to sink from  bed and complete grooming in standing. Cues to maintain upright posture due and midline orientation due to fatigue. 2/4 dyspnea with activity     Vision     Perception     Praxis      Pertinent Vitals/Pain Pain Assessment: No/denies pain     Hand Dominance Right   Extremity/Trunk Assessment Upper Extremity Assessment Upper Extremity Assessment: Generalized weakness   Lower Extremity Assessment Lower Extremity Assessment: Generalized weakness RLE Deficits / Details: strength grossly 4/5   Cervical / Trunk Assessment Cervical / Trunk Assessment: Kyphotic   Communication Communication Communication: HOH   Cognition Arousal/Alertness: Awake/alert Behavior During Therapy: WFL for tasks assessed/performed Overall Cognitive Status: History of cognitive impairments - at baseline                     General Comments       Exercises       Shoulder Instructions      Home Living Family/patient expects to be discharged to:: Skilled nursing facility Living Arrangements: Spouse/significant other Available Help at Discharge: Family;Available 24 hours/day Type of Home: House Home Access: Ramped entrance     Home Layout: One level     Bathroom Shower/Tub: Teacher, early years/pre: Standard     Home Equipment: Environmental consultant - 2 wheels;Wheelchair - manual;Grab bars - tub/shower;Bedside commode  Prior Functioning/Environment Level of Independence: Needs assistance  Gait / Transfers Assistance Needed: Wife assists with tub transfers.  Pt ambulating without AD PTA with no falls in the past year. ADL's / Homemaking Assistance Needed: Assist from wife for bathing, dressing, cooking, cleaning, driving. Communication / Swallowing Assistance Needed: see Speech recommendations      OT Diagnosis: Generalized weakness   OT Problem List: Decreased strength;Decreased activity tolerance;Impaired balance (sitting and/or standing);Decreased safety  awareness;Decreased cognition;Decreased knowledge of use of DME or AE;Cardiopulmonary status limiting activity   OT Treatment/Interventions: Self-care/ADL training;Therapeutic exercise;Energy conservation;DME and/or AE instruction;Therapeutic activities;Patient/family education;Balance training    OT Goals(Current goals can be found in the care plan section) Acute Rehab OT Goals Patient Stated Goal: to get stronger adn go home OT Goal Formulation: With patient Time For Goal Achievement: January 24, 2016 Potential to Achieve Goals: Good ADL Goals Pt Will Perform Lower Body Bathing: with supervision;sit to/from stand Pt Will Perform Lower Body Dressing: with supervision;sit to/from stand Pt Will Transfer to Toilet: with supervision;ambulating;bedside commode Pt Will Perform Toileting - Clothing Manipulation and hygiene: with supervision;sit to/from stand;sitting/lateral leans Pt/caregiver will Perform Home Exercise Program: Both right and left upper extremity;Increased strength;With Supervision  OT Frequency: Min 2X/week   Barriers to D/C:            Co-evaluation              End of Session Equipment Utilized During Treatment: Gait belt;Rolling walker Nurse Communication: Mobility status  Activity Tolerance: Patient tolerated treatment well Patient left: in chair;with call bell/phone within reach;with chair alarm set   Time: 1026-1050 OT Time Calculation (min): 24 min Charges:  OT Evaluation $OT Eval Moderate Complexity: 1 Procedure OT Treatments $Self Care/Home Management : 8-22 mins G-Codes:    Mehran Guderian,[place] 01-24-2016, 11:02 AM   Maurie Boettcher, OTR/L  [phone] January 24, 2016

## 2016-01-15 NOTE — Evaluation (Signed)
Clinical/Bedside Swallow Evaluation Patient Details  Name: [name] MRN: [id number] Date of Birth: 1933/03/23  Today's Date: 01/15/2016 Time: SLP Start Time (ACUTE ONLY): [id number] SLP Stop Time (ACUTE ONLY): 0924 SLP Time Calculation (min) (ACUTE ONLY): 8 min  Past Medical History:  Past Medical History:  Diagnosis Date  . Anemia   . ASD (atrial septal defect)    a. s/p repair 1983.  [name] BPH (benign prostatic hyperplasia)   . Cerebrovascular accident ([place]) [place]  . Chronic bronchitis ([place])    a. h/o resp failure in setting of bronchitis vs PNA 05/2012.  [name] Chronic diarrhea   . COPD (chronic obstructive pulmonary disease) ([place])   . Coronary artery disease    a. Nonobstructive by cath 2009 and 09/06/14  . Depression   . Dysphagia, unspecified(787.20)   . Erosive gastritis    H/o GIB felt secondary to this  . Esophagitis   . Gastric polyp   . Gastric ulcer   . GERD (gastroesophageal reflux disease)   . Hearing impairment   . Hiatal hernia   . History of upper gastrointestinal hemorrhage    dr. Maurene [name], GI  . Hyperlipidemia   . Hypertension   . Internal hemorrhoids   . LV dysfunction    a. EF 45-50% by echo 05/2012.  [name] Permanent atrial fibrillation (HCC)    [facility]R followed at [facility].  [name] Vision problems    Past Surgical History:  Past Surgical History:  Procedure Laterality Date  . ASD REPAIR  1983  . CARDIOVERSION     possibly 3 times, dr. brody/[name]  . CHOLECYSTECTOMY  1990s  . ENTEROSCOPY  05/13/2012   Procedure: ENTEROSCOPY;  Surgeon: Darrell Castle, MD;  Location: [facility] ENDOSCOPY;  Service: Endoscopy;  Laterality: N/A;  . [place]  . LEFT HEART CATHETERIZATION WITH CORONARY ANGIOGRAM N/A 09/06/2014   Procedure: LEFT HEART CATHETERIZATION WITH CORONARY ANGIOGRAM;  Surgeon: Darrell Booze, MD;  Location: [facility] CATH LAB;  Service: Cardiovascular;  Laterality: N/A;  . partial small bowel resection  2005   ischemic?   HPI:  Pt is a 82 y/o M who presented  with SOB on exertion.  In a-fib with RVR upon arrival, BP elevated.  CTA + Lt sided PE with Rt heart strain.  Pt's PMH includes CVA, COPD.    Assessment / Plan / Recommendation Clinical Impression  Pt denies any difficulty swallowing when he takes small bites/sips at a slow rate, and demonstrates his use of these strategies under SLP skilled observation with Mod I. No overt signs of aspiration observed. Recommend to continue with current diet and aspiration precautions. No acute SLP f/u indicated.    Aspiration Risk  Mild aspiration risk    Diet Recommendation Regular;Thin liquid   Liquid Administration via: Cup;Straw Medication Administration: Whole meds with puree Supervision: Patient able to self feed;Intermittent supervision to cue for compensatory strategies Compensations: Slow rate;Small sips/bites Postural Changes: Seated upright at 90 degrees;Remain upright for at least 30 minutes after po intake    Other  Recommendations Oral Care Recommendations: Oral care BID   Follow up Recommendations  None    Frequency and Duration            Prognosis        Swallow Study   General HPI: Pt is a 82 y/o M who presented with SOB on exertion.  In a-fib with RVR upon arrival, BP elevated.  CTA + Lt sided PE with Rt heart strain.  Pt's PMH  includes CVA, COPD.  Type of Study: Bedside Swallow Evaluation Previous Swallow Assessment: BSE in May 2014 recommending regular diet and thin liquids Diet Prior to this Study: Regular;Thin liquids Temperature Spikes Noted: No Respiratory Status: Room air History of Recent Intubation: No Behavior/Cognition: Alert;Cooperative;Pleasant mood Oral Cavity Assessment: Within Functional Limits Oral Care Completed by SLP: No Oral Cavity - Dentition: Dentures, top;Dentures, bottom Vision: Functional for self-feeding Self-Feeding Abilities: Able to feed self Patient Positioning: Upright in chair Baseline Vocal Quality: Normal    Oral/Motor/Sensory  Function     Ice Chips Ice chips: Not tested   Thin Liquid Thin Liquid: Within functional limits Presentation: Self Fed;Straw    Nectar Thick Nectar Thick Liquid: Not tested   Honey Thick Honey Thick Liquid: Not tested   Puree Puree: Not tested   Solid   GO   Solid: Within functional limits Presentation: Self [name] 01/15/2016,9:52 AM  [name], M.A. CCC-SLP [phone]

## 2016-01-15 NOTE — Clinical Social Work Note (Signed)
Clinical Social Worker facilitated patient discharge including contacting patient family and facility to confirm patient discharge plans.  Clinical information faxed to facility and family agreeable with plan.  CSW arranged ambulance transport via [facility] to [place].  RN to call report prior to discharge.  Clinical Social Worker will sign off for now as social work intervention is no longer needed. Please consult us again if new need arises.  Jesse Sabena Winner, LCSW [phone] 

## 2016-01-15 NOTE — NC FL2 (Signed)
[place] LEVEL OF CARE SCREENING TOOL     IDENTIFICATION  Patient Name: [name] Birthdate: 1932/11/24 Sex: male Admission Date (Current Location): 01/11/2016  [facility] and [place] Number:  [employer] and Address:  The [place], [place] [address] [place], [place] [address]      Provider Number: [id number]  Attending Physician Name and Address:  Domenic Polite, MD  Relative Name and Phone Number:       Current Level of Care: Hospital Recommended Level of Care: [place] Prior Approval Number:    Date Approved/Denied:   PASRR Number: [id number]  Discharge Plan: SNF    Current Diagnoses: Patient Active Problem List   Diagnosis Date Noted  . Pulmonary emboli ([place]) 01/11/2016  . Actinic keratoses 11/16/2015  . Memory loss 11/16/2015  . Atypical chest pain 01/28/2015  . Right carotid bruit 09/26/2014  . Abnormal stress test   . Chronic atrial fibrillation ([place]) 09/04/2014  . On warfarin therapy 09/04/2014  . Encounter for therapeutic drug monitoring 07/26/2013  . Bradycardia 02/17/2013  . Acute on chronic systolic CHF (congestive heart failure) ([place]) 11/16/2012  . Acute on chronic renal failure ([place]) 11/16/2012  . Failure to thrive in adult 10/30/2012  . Anemia of chronic disease 10/13/2012  . Toxic encephalopathy secondary to UTI 10/12/2012  . Depression 10/12/2012  . Moderate protein-calorie malnutrition ([place]) 10/12/2012  . Stage III chronic kidney disease 10/12/2012  . CHF (congestive heart failure) ([place]) 09/06/2012  . Acute diastolic CHF (congestive heart failure) ([place]) 07/23/2012  . Hypokalemia 07/20/2012  . Hypomagnesemia 07/20/2012  . Hypocalcemia 07/20/2012  . Acute kidney injury ([place]) 07/20/2012  . Chronic diarrhea 07/20/2012  . Falls frequently 07/20/2012  . Leukocytosis 07/20/2012  . Normocytic anemia 07/20/2012  . Chronic respiratory failure ([place]) 05/23/2012  . COPD exacerbation  ([place]) 05/22/2012  . Gastritis with hemorrhage 05/13/2012  . Stricture and stenosis of esophagus 05/13/2012  . Acute posthemorrhagic anemia 05/12/2012  . Atrial fibrillation with rapid ventricular response ([place]) 05/06/2012  . Dysphagia 05/06/2012  . HTN (hypertension) 05/06/2012  . H/O: CVA (cerebrovascular accident) 05/06/2012  . GERD (gastroesophageal reflux disease) 05/06/2012  . Purulent bronchitis ([place]) 05/06/2012  . SOB (shortness of breath) 03/19/2010  . ABDOMINAL DISTENSION 03/19/2010  . FATIGUE 11/09/2009  . B12 DEFICIENCY 07/26/2009  . Hyperglycemia 07/26/2009  . Hyperlipidemia 07/20/2009  . Hereditary and idiopathic peripheral neuropathy 07/20/2009  . BENIGN PROSTATIC HYPERTROPHY, HX OF 07/20/2009  . Essential hypertension 09/05/2007  . Coronary atherosclerosis 09/05/2007  . FIBRILLATION, ATRIAL 09/05/2007  . Cerebral artery occlusion with cerebral infarction ([place]) 09/05/2007  . ESOPHAGITIS 09/05/2007  . GERD 09/05/2007  . EROSIVE GASTRITIS 09/05/2007  . UPPER GASTROINTESTINAL HEMORRHAGE 09/05/2007  . DYSPHAGIA UNSPECIFIED 09/05/2007  . INTERNAL HEMORRHOIDS 06/10/2003  . GASTRIC POLYP 12/03/2001  . HIATAL HERNIA 12/03/2001  . COLONIC POLYPS 09/25/1992  . GASTRIC ULCER 09/22/1992    Orientation RESPIRATION BLADDER Height & Weight     Self, Time, Situation, Place  Normal External catheter Weight: 142 lb (64.4 kg) Height:  5\' 11"  (180.3 cm)  BEHAVIORAL SYMPTOMS/MOOD NEUROLOGICAL BOWEL NUTRITION STATUS      Continent Diet (Heart Healthy)  AMBULATORY STATUS COMMUNICATION OF NEEDS Skin   Extensive Assist Verbally Normal                       Personal Care Assistance Level of Assistance  Bathing, Feeding, Dressing Bathing Assistance: Limited assistance Feeding assistance: Independent Dressing Assistance:  Limited assistance     Functional Limitations Info  Sight, Hearing, Speech Sight Info: Adequate Hearing Info: Impaired Speech Info: Adequate     SPECIAL CARE FACTORS FREQUENCY  PT (By licensed PT), OT (By licensed OT)     PT Frequency: 3 OT Frequency: 3            Contractures Contractures Info: Not present    Additional Factors Info  Code Status, Allergies, Isolation Precautions Code Status Info: DNR Allergies Info: Tape, Penicillins     Isolation Precautions Info: MRSA     Current Medications (01/15/2016):  This is the current hospital active medication list Current Facility-Administered Medications  Medication Dose Route Frequency Provider Last Rate Last Dose  . atorvastatin (LIPITOR) tablet 10 mg  10 mg Oral q1800 Kimberly B Hammons, RPH   10 mg at 01/14/16 1646  . diltiazem (CARDIZEM CD) 24 hr capsule 180 mg  180 mg Oral Daily Domenic Polite, MD   180 mg at 01/15/16 1008  . dutasteride (AVODART) capsule 0.5 mg  0.5 mg Oral Daily Florencia Reasons, MD   0.5 mg at 01/15/16 1008  . gabapentin (NEURONTIN) capsule 300 mg  300 mg Oral TID Domenic Polite, MD   300 mg at 01/15/16 1008  . ipratropium (ATROVENT) nebulizer solution 0.5 mg  0.5 mg Nebulization Q6H PRN Florencia Reasons, MD      . isosorbide mononitrate (IMDUR) 24 hr tablet 30 mg  30 mg Oral QHS Florencia Reasons, MD   30 mg at 01/14/16 2058  . levalbuterol (XOPENEX) nebulizer solution 0.63 mg  0.63 mg Nebulization Q6H PRN Domenic Polite, MD      . mirtazapine (REMERON) tablet 7.5 mg  7.5 mg Oral QHS Florencia Reasons, MD   7.5 mg at 01/14/16 2058  . nitroGLYCERIN (NITROSTAT) SL tablet 0.4 mg  0.4 mg Sublingual Q5 min PRN Duffy Bruce, MD   0.4 mg at 01/11/16 1510  . potassium chloride (K-DUR) CR tablet 20 mEq  20 mEq Oral Daily Florencia Reasons, MD   20 mEq at 01/15/16 1007  . predniSONE (DELTASONE) tablet 50 mg  50 mg Oral Q breakfast Domenic Polite, MD   50 mg at 01/15/16 1007  . Rivaroxaban (XARELTO) tablet 15 mg  15 mg Oral BID WC Haze Boyden, RPH   15 mg at 01/15/16 [id number]   Followed by  . [START ON 02/03/2016] rivaroxaban (XARELTO) tablet 20 mg  20 mg Oral Q supper Haze Boyden, [facility]      . rivastigmine  (EXELON) 4.6 mg/24hr 4.6 mg  4.6 mg Transdermal Daily Florencia Reasons, MD   4.6 mg at 01/15/16 1007  . saccharomyces boulardii (FLORASTOR) capsule 250 mg  250 mg Oral q morning - 10a Florencia Reasons, MD   250 mg at 01/15/16 1008  . sodium chloride flush (NS) 0.9 % injection 3 mL  3 mL Intravenous Q12H Florencia Reasons, MD   10 mL at 01/14/16 2059  . vitamin B-12 (CYANOCOBALAMIN) tablet 500 mcg  500 mcg Oral Daily Florencia Reasons, MD   500 mcg at 01/15/16 1008     Discharge Medications: Please see discharge summary for a list of discharge medications.  Relevant Imaging Results:  Relevant Lab Results:   Additional Information SSN [id number]    Barbette Or, [place]

## 2016-01-15 NOTE — Care Management Note (Signed)
Case Management Note  Patient Details  Name: [name] MRN: [id number] Date of Birth: 1932-07-10  Subjective/Objective:   Patient presents with afib with RVR, left PE. Will be on xarelto,  Per pt eval rec snf, CSW referral.                    Action/Plan:   Expected Discharge Date:                  Expected Discharge Plan:  Skilled Nursing Facility  In-House Referral:  Clinical Social Work  Discharge planning Services  CM Consult  Post Acute Care Choice:    Choice offered to:     DME Arranged:    DME Agency:     HH Arranged:    [place] Agency:     Status of Service:  In process, will continue to follow  If discussed at Long Length of Stay Meetings, dates discussed:    Additional Comments:  Zenon Mayo, RN 01/15/2016, 2:16 PM

## 2016-01-16 LAB — CULTURE, BLOOD (ROUTINE X 2)
CULTURE: NO GROWTH
CULTURE: NO GROWTH

## 2016-01-16 LAB — ANTIPHOSPHOLIPID SYNDROME EVAL, BLD
Anticardiolipin IgM: 9 MPL U/mL (ref 0–12)
DRVVT: 57.8 s — AB (ref 0.0–47.0)
PHOSPHATYDALSERINE, IGA: 1 {APS'U} (ref 0–20)
PTT Lupus Anticoagulant: 74.9 s — ABNORMAL HIGH (ref 0.0–51.9)
Phosphatydalserine, IgG: 3 GPS IgG (ref 0–11)
Phosphatydalserine, IgM: 10 MPS IgM (ref 0–25)

## 2016-01-16 LAB — DRVVT MIX: DRVVT MIX: 42 s (ref 0.0–47.0)

## 2016-01-16 LAB — PTT-LA MIX: PTT-LA MIX: 60 s — AB (ref 0.0–48.9)

## 2016-01-16 LAB — HEXAGONAL PHASE PHOSPHOLIPID: Hexagonal Phase Phospholipid: 4 s (ref 0–11)

## 2016-01-22 ENCOUNTER — Ambulatory Visit: Payer: Medicare Other | Admitting: Family Medicine

## 2016-01-29 ENCOUNTER — Telehealth: Payer: Self-pay | Admitting: Family Medicine

## 2016-01-29 NOTE — Telephone Encounter (Signed)
Received a PA request from [employer].  Pt given Xarelto 15 mg from Dr. Eula Fried at [facility].  Reviewed chart and see that pt has been on Warfarin.  Has pt changed medications?  Should we proceed with PA since written by another physician?  Please advise.  Thanks!!

## 2016-01-31 ENCOUNTER — Other Ambulatory Visit: Payer: Self-pay | Admitting: Family Medicine

## 2016-02-01 ENCOUNTER — Telehealth: Payer: Self-pay

## 2016-02-01 NOTE — Telephone Encounter (Signed)
Patient on Dr. Erick Blinks schedule tomorrow at 11:00am. Dr. Yong Channel advised patient to take the 15 mg tablet tonight with dinner and 1 tablet with breakfast in morning. Patient's wife verbalized understanding and repeated back instructions and understanding of appointment

## 2016-02-01 NOTE — Telephone Encounter (Signed)
Last appointment with Cardiology was on 03/29/2015

## 2016-02-01 NOTE — Telephone Encounter (Signed)
Prior Authorization completed for Rivaroxaban (XARELTO) 15 MG TABS tablet  Key: Key: [id number]

## 2016-02-01 NOTE — Telephone Encounter (Signed)
Jaime from [facility]   is calling the pt has been with out xarelto for 6 day now. This was prescribed when pt was in hospital. Please provider samples if possible until PA has been approve

## 2016-02-01 NOTE — Telephone Encounter (Signed)
Rx refill sent to pharmacy. 

## 2016-02-01 NOTE — Telephone Encounter (Signed)
Patient had an acute PE while on coumadin on review of chart. He needs to be on xarelto 15mg  BID for 3 weeks (I would think restart therapy at this point given lapse in care). After that point convert to 20mg . GIven transition that is why both meds were listed- unclear why at his nursing facility he was allowed to have 6 day gap- can be investigated tomorrow. Glad he had samples to take BID for now

## 2016-02-01 NOTE — Telephone Encounter (Signed)
Spoke to patient. Patient will be coming to office for 15 mg sample and will schedule to see Dr. Elease Hashimoto to confirm dosage of Xarleto. Discharge summary from discharge on 01/12/16 states to start Xarelto but both 15 and 20 mg dosage was indicated. Will route message to Dr. Yong Channel for clarification in Dr. Erick Blinks absence

## 2016-02-01 NOTE — Telephone Encounter (Signed)
FYI

## 2016-02-02 ENCOUNTER — Ambulatory Visit (INDEPENDENT_AMBULATORY_CARE_PROVIDER_SITE_OTHER): Payer: Medicare Other | Admitting: Family Medicine

## 2016-02-02 ENCOUNTER — Encounter: Payer: Self-pay | Admitting: Family Medicine

## 2016-02-02 VITALS — BP 120/62 | HR 109 | Temp 97.4°F | Ht 71.0 in | Wt 146.0 lb

## 2016-02-02 DIAGNOSIS — J441 Chronic obstructive pulmonary disease with (acute) exacerbation: Secondary | ICD-10-CM | POA: Diagnosis not present

## 2016-02-02 DIAGNOSIS — I2699 Other pulmonary embolism without acute cor pulmonale: Secondary | ICD-10-CM | POA: Diagnosis not present

## 2016-02-02 DIAGNOSIS — I10 Essential (primary) hypertension: Secondary | ICD-10-CM | POA: Diagnosis not present

## 2016-02-02 DIAGNOSIS — R972 Elevated prostate specific antigen [PSA]: Secondary | ICD-10-CM

## 2016-02-02 DIAGNOSIS — I482 Chronic atrial fibrillation, unspecified: Secondary | ICD-10-CM

## 2016-02-02 MED ORDER — RIVAROXABAN 20 MG PO TABS: 20.0000 mg | ORAL_TABLET | Freq: Every day | ORAL | 6 refills | Status: DC

## 2016-02-02 NOTE — Progress Notes (Signed)
Subjective:     Patient ID: [name], male   DOB: 24-Jan-1933, 82 y.o.   MRN: [id number]  HPI Patient seen for hospital follow-up. There were several phone calls yeaterday which created some confusion. They were calling about getting Xarelto. He had not yet had hospital follow-up and there were questions regarding appropriate dosage.  He has long-standing history of atrial fibrillation and prior stroke and had been on chronic Coumadin. He developed increased exertional dyspnea and presented to come hospital for further evaluation. Was noted to be in A. fib with rapid ventricular response but no hypoxia and CBC unremarkable. Potassium 3.1. . CT angiogram left-sided PE with right heart strain. Patient was started on IV heparin and Cardizem drip.  He had been getting Coumadin monitored regularly but slightly subtherapeutic INR in the 1.6-1.9 range recently. Risk factors for PE including very sedentary. It was noted that patient had slightly elevated PSA 4.82. Echocardiogram normal ejection fraction. Venous Doppler no DVT. Recommendation was to transitioned to Xarelto. This was started on 01/15/16. He apparently was discharged to rehabilitation facility and just got out. They called our office yesterday and samples of Xarelto 15 mg twice a day were provided until medications could be clarified at follow-up.  Patient had some wheezing. Known COPD with exacerbation. Was discharged on prednisone and no cough or wheezing at this time  PSA elevated as above. Patient denies any obstructive urinary symptoms.  Past Medical History:  Diagnosis Date  . Anemia   . ASD (atrial septal defect)    a. s/p repair 1983.  [name] BPH (benign prostatic hyperplasia)   . Cerebrovascular accident ([place]) [place]  . Chronic bronchitis ([place])    a. h/o resp failure in setting of bronchitis vs PNA 05/2012.  [name] Chronic diarrhea   . COPD (chronic obstructive pulmonary disease) ([place])   . Coronary artery disease    a. Nonobstructive  by cath 2009 and 09/06/14  . Depression   . Dysphagia, unspecified(787.20)   . Erosive gastritis    H/o GIB felt secondary to this  . Esophagitis   . Gastric polyp   . Gastric ulcer   . GERD (gastroesophageal reflux disease)   . Hearing impairment   . Hiatal hernia   . History of upper gastrointestinal hemorrhage    dr. Maurene Capes, GI  . Hyperlipidemia   . Hypertension   . Internal hemorrhoids   . LV dysfunction    a. EF 45-50% by echo 05/2012.  [name] Permanent atrial fibrillation (HCC)    INR followed at [facility].  [name] Vision problems    Past Surgical History:  Procedure Laterality Date  . ASD REPAIR  1983  . CARDIOVERSION     possibly 3 times, dr. brody/cooper  . CHOLECYSTECTOMY  1990s  . ENTEROSCOPY  05/13/2012   Procedure: ENTEROSCOPY;  Surgeon: Inda Castle, MD;  Location: [facility] ENDOSCOPY;  Service: Endoscopy;  Laterality: N/A;  . [place]  . LEFT HEART CATHETERIZATION WITH CORONARY ANGIOGRAM N/A 09/06/2014   Procedure: LEFT HEART CATHETERIZATION WITH CORONARY ANGIOGRAM;  Surgeon: Jettie Booze, MD;  Location: [facility] CATH LAB;  Service: Cardiovascular;  Laterality: N/A;  . partial small bowel resection  2005   ischemic?    reports that he quit smoking about 32 years ago. His smoking use included Cigarettes. He smoked 0.00 packs per day for 0.00 years. He has never used smokeless tobacco. He reports that he does not drink alcohol or use drugs. family history includes Heart disease in  his father and mother; Prostate cancer in his brother. Allergies  Allergen Reactions  . Lovenox [Enoxaparin Sodium] Swelling  . Tape Other (See Comments)    SKIN WILL TEAR IF TAPED (MUST BE LIGHT & BRIEF OR USE COBAN WRAP, PLEASE!!)  . Penicillins Hives    Has patient had a PCN reaction causing immediate rash, facial/tongue/throat swelling, SOB or lightheadedness with hypotension: Yes Has patient had a PCN reaction causing severe rash involving mucus membranes or skin necrosis: No Has  patient had a PCN reaction that required hospitalization: Unknown Has patient had a PCN reaction occurring within the last 10 years: No If all of the above answers are "NO", then may proceed with Cephalosporin use.      Review of Systems  Constitutional: Positive for fatigue. Negative for chills and fever.  Respiratory: Negative for shortness of breath and wheezing.   Cardiovascular: Negative for chest pain and palpitations.  Gastrointestinal: Negative for abdominal pain, diarrhea, nausea and vomiting.  Genitourinary: Negative for decreased urine volume and dysuria.  Skin: Negative for rash.  Neurological: Positive for weakness. Negative for dizziness and syncope.  Hematological: Negative for adenopathy.  Psychiatric/Behavioral: Negative for confusion.       Objective:   Physical Exam  Constitutional: He appears well-developed and well-nourished.  HENT:  Mouth/Throat: Oropharynx is clear and moist.  Neck: Neck supple.  Cardiovascular: Normal rate.   Pulmonary/Chest: Effort normal and breath sounds normal. No respiratory distress. He has no wheezes. He has no rales.  Abdominal: Soft. There is no tenderness.  Musculoskeletal:  Trace edema lower legs bilaterally  Neurological: He is alert.       Assessment:     #1 recent acute left-sided pulmonary embolus on Coumadin with recent slightly subtherapeutic Coumadin levels. Patient transitioned to Xarelto  #2 history of atrial fibrillation. Recent rapid ventricular response currently stable on Cardizem  #3 mildly elevated PSA  #4 hypertension stable  #5 history of CVA  #6 history of diastolic heart failure   #7 recent COPD exacerbation improved following steroids   #8 remote history of gastric ulcer    Plan:     -He apparently has been on Xarelto 15 mg twice daily now for almost 3 weeks. He was given samples of 15 mg yesterday. They'll continue this through the weekend and then transition to Xarelto 20 mg once daily  with prescription written for the 20 mg -Follow-up immediately for any bleeding complications. -Continue home nursing, physical therapy, occupational therapy -There had been recommendation to consider urology referral. He does have elevated PSA but no obstructive symptoms. I do not feel he is a good candidate for further evaluation regarding elevated PSA. Even if he has prostate cancer in his debilitated state aggressive intervention would not be recommended -Follow-up in one week to reassess and sooner as needed  Over 40 minutes spent with patient of which > 50% in direct time assessing pt and counseling regarding medications, home health needs, discussing recent abnormal labs, reducing fall risk.  [name] MD [facility]

## 2016-02-02 NOTE — Patient Instructions (Signed)
Continue with Xarelto 15 mg twice daily through the weekend By early next week, transition to Xarelto 20 mg ONCE daily.

## 2016-02-06 ENCOUNTER — Ambulatory Visit: Payer: Self-pay | Admitting: General Practice

## 2016-02-06 ENCOUNTER — Telehealth: Payer: Self-pay | Admitting: General Practice

## 2016-02-06 NOTE — Telephone Encounter (Signed)
LMOVM to call Cindy Boyd, RN @ [phone]. 

## 2016-02-07 ENCOUNTER — Telehealth: Payer: Self-pay

## 2016-02-07 NOTE — Telephone Encounter (Signed)
Received fax for PA approval on Xarelto 15mg  tabs, pharmacy aware.

## 2016-02-12 ENCOUNTER — Ambulatory Visit (INDEPENDENT_AMBULATORY_CARE_PROVIDER_SITE_OTHER): Payer: Medicare Other | Admitting: Family Medicine

## 2016-02-12 ENCOUNTER — Telehealth: Payer: Self-pay | Admitting: Family Medicine

## 2016-02-12 ENCOUNTER — Encounter: Payer: Self-pay | Admitting: Family Medicine

## 2016-02-12 VITALS — BP 110/70 | HR 79 | Ht 71.0 in | Wt 142.9 lb

## 2016-02-12 DIAGNOSIS — I10 Essential (primary) hypertension: Secondary | ICD-10-CM

## 2016-02-12 DIAGNOSIS — I2699 Other pulmonary embolism without acute cor pulmonale: Secondary | ICD-10-CM

## 2016-02-12 NOTE — Progress Notes (Signed)
Subjective:     Patient ID: [name], male   DOB: Sep 23, 1932, 82 y.o.   MRN: [id number]  HPI Patient seen for follow-up regarding recent pulmonary embolus on coumadin. Refer to last visit for details of his recent hospitalization. He had been on Coumadin for several years for history of atrial fibrillation with slightly subtherapeutic INR on couple of occasions. He was transitioned to Xarelto and now on 20 mg once daily. No bleeding complications. No dyspnea. No chest pains. In general, he is very sedentary and has had poor appetite for quite some time. Is eating regular meals. No falls. Still needs flu vaccine. History of BPH symptomatically stable on Avodart  Patient had slightly elevated PSA. We discussed at last visit option of further evaluation but did not feel was in his best interest given his multiple comorbidities and age and he and his wife agreed with observation.  No major obstructive urinary symptoms at this time.  Past Medical History:  Diagnosis Date  . Anemia   . ASD (atrial septal defect)    a. s/p repair 1983.  [name] BPH (benign prostatic hyperplasia)   . Cerebrovascular accident ([place]) [place]  . Chronic bronchitis ([place])    a. h/o resp failure in setting of bronchitis vs PNA 05/2012.  [name] Chronic diarrhea   . COPD (chronic obstructive pulmonary disease) ([place])   . Coronary artery disease    a. Nonobstructive by cath 2009 and 09/06/14  . Depression   . Dysphagia, unspecified(787.20)   . Erosive gastritis    H/o GIB felt secondary to this  . Esophagitis   . Gastric polyp   . Gastric ulcer   . GERD (gastroesophageal reflux disease)   . Hearing impairment   . Hiatal hernia   . History of upper gastrointestinal hemorrhage    dr. Maurene Capes, GI  . Hyperlipidemia   . Hypertension   . Internal hemorrhoids   . LV dysfunction    a. EF 45-50% by echo 05/2012.  [name] Permanent atrial fibrillation (HCC)    INR [facility] at [facility].  [name] Vision problems    Past Surgical History:  Procedure  Laterality Date  . ASD REPAIR  1983  . CARDIOVERSION     possibly 3 times, dr. brody/cooper  . CHOLECYSTECTOMY  1990s  . ENTEROSCOPY  05/13/2012   Procedure: ENTEROSCOPY;  Surgeon: Inda Castle, MD;  Location: [facility] ENDOSCOPY;  Service: Endoscopy;  Laterality: N/A;  . [place]  . LEFT HEART CATHETERIZATION WITH CORONARY ANGIOGRAM N/A 09/06/2014   Procedure: LEFT HEART CATHETERIZATION WITH CORONARY ANGIOGRAM;  Surgeon: Jettie Booze, MD;  Location: [facility] CATH LAB;  Service: Cardiovascular;  Laterality: N/A;  . partial small bowel resection  2005   ischemic?    reports that he quit smoking about 32 years ago. His smoking use included Cigarettes. He smoked 0.00 packs per day for 0.00 years. He has never used smokeless tobacco. He reports that he does not drink alcohol or use drugs. family history includes Heart disease in his father and mother; Prostate cancer in his brother. Allergies  Allergen Reactions  . Lovenox [Enoxaparin Sodium] Swelling  . Tape Other (See Comments)    SKIN WILL TEAR IF TAPED (MUST BE LIGHT & BRIEF OR USE COBAN WRAP, PLEASE!!)  . Penicillins Hives    Has patient had a PCN reaction causing immediate rash, facial/tongue/throat swelling, SOB or lightheadedness with hypotension: Yes Has patient had a PCN reaction causing severe rash involving mucus membranes or skin  necrosis: No Has patient had a PCN reaction that required hospitalization: Unknown Has patient had a PCN reaction occurring within the last 10 years: No If all of the above answers are "NO", then may proceed with Cephalosporin use.      Review of Systems  Constitutional: Positive for fatigue. Negative for chills and fever.  Eyes: Negative for visual disturbance.  Respiratory: Negative for cough, chest tightness and shortness of breath.   Cardiovascular: Negative for chest pain, palpitations and leg swelling.  Gastrointestinal: Negative for abdominal pain and blood in stool.   Neurological: Negative for dizziness, syncope, weakness, light-headedness and headaches.  Hematological: Does not bruise/bleed easily.       Objective:   Physical Exam  Constitutional: He appears well-developed and well-nourished.  HENT:  Mouth/Throat: Oropharynx is clear and moist.  Neck: Neck supple.  Cardiovascular: Normal rate.   Irregular rhythm but rate controlled  Pulmonary/Chest: Effort normal and breath sounds normal. No respiratory distress. He has no wheezes. He has no rales.  Musculoskeletal: He exhibits no edema.  Neurological: He is alert.       Assessment:     #1 recent pulmonary embolus on Coumadin.  Symptomatically stable-now on Xarelto.  #2 hypertension stable and at goal  #3 slightly elevated PSA    Plan:     -Flu vaccine given -Continue Xarelto 20 mg once daily -Routine follow-up in 6 months  [name] MD [facility] at [facility]

## 2016-02-12 NOTE — Telephone Encounter (Signed)
Medication list is updated.

## 2016-02-12 NOTE — Telephone Encounter (Signed)
° °  Wife said husband is no longer taking the following med and asked that it be removed from his med list      diltiazem (CARDIZEM CD) 180 MG 24 hr capsule

## 2016-02-12 NOTE — Progress Notes (Signed)
Pre visit review using our clinic review tool, if applicable. No additional management support is needed unless otherwise documented below in the visit note. 

## 2016-02-14 ENCOUNTER — Ambulatory Visit: Payer: Medicare Other

## 2016-02-14 ENCOUNTER — Telehealth: Payer: Self-pay | Admitting: Family Medicine

## 2016-02-14 NOTE — Telephone Encounter (Signed)
[name] would like verbal order to continue physical therapy with the pt for twice a wk for 2 wk. Pt fell on Monday and he is ok

## 2016-02-14 NOTE — Telephone Encounter (Signed)
OK to extend PT

## 2016-02-15 NOTE — Telephone Encounter (Signed)
Magda Paganini is aware via voicemail of the verbal orders.

## 2016-03-05 ENCOUNTER — Other Ambulatory Visit: Payer: Self-pay | Admitting: Family Medicine

## 2016-03-05 ENCOUNTER — Other Ambulatory Visit: Payer: Self-pay | Admitting: Nurse Practitioner

## 2016-03-07 ENCOUNTER — Other Ambulatory Visit: Payer: Self-pay | Admitting: Nurse Practitioner

## 2016-03-09 ENCOUNTER — Other Ambulatory Visit: Payer: Self-pay | Admitting: Family Medicine

## 2016-03-19 ENCOUNTER — Other Ambulatory Visit: Payer: Self-pay | Admitting: Nurse Practitioner

## 2016-03-20 NOTE — Telephone Encounter (Signed)
furosemide (LASIX) 20 MG tablet  Medication  Date: 03/07/2016 Department: [address] Office Ordering/Authorizing: Rogelia Mire, NP  Order Providers   Prescribing Provider Encounter Provider  Rogelia Mire, NP Rogelia Mire, NP  Medication Detail    Disp Refills Start End   furosemide (LASIX) 20 MG tablet 30 tablet 0 03/07/2016    Sig: TAKE (1) TABLET BY MOUTH AS NEEDED FOR EDEMA   Notes to Pharmacy: Please call our office to schedule an yearly appointment for October for future refills. [phone]. Thank you 1st attempt   E-Prescribing Status: Receipt confirmed by pharmacy (03/07/2016 9:05 AM EDT)   Pharmacy   [place], [place]

## 2016-03-21 ENCOUNTER — Other Ambulatory Visit: Payer: Self-pay | Admitting: Family Medicine

## 2016-03-21 NOTE — Telephone Encounter (Signed)
We need to clarify  His med list states he is on 300 mg dose.

## 2016-03-22 NOTE — Telephone Encounter (Signed)
I left a message for the pt to return my call. 

## 2016-03-27 ENCOUNTER — Encounter: Payer: Self-pay | Admitting: Pediatrics

## 2016-03-27 ENCOUNTER — Ambulatory Visit (INDEPENDENT_AMBULATORY_CARE_PROVIDER_SITE_OTHER): Payer: Medicare Other | Admitting: Pediatrics

## 2016-03-27 VITALS — BP 146/81 | HR 98 | Temp 97.2°F | Ht 71.0 in | Wt 149.0 lb

## 2016-03-27 DIAGNOSIS — S41101A Unspecified open wound of right upper arm, initial encounter: Secondary | ICD-10-CM | POA: Diagnosis not present

## 2016-03-27 DIAGNOSIS — W19XXXA Unspecified fall, initial encounter: Secondary | ICD-10-CM

## 2016-03-27 DIAGNOSIS — W01198A Fall on same level from slipping, tripping and stumbling with subsequent striking against other object, initial encounter: Secondary | ICD-10-CM | POA: Diagnosis not present

## 2016-03-27 NOTE — Progress Notes (Signed)
  Subjective:   Patient ID: [name], male    DOB: 1933-03-26, 82 y.o.   MRN: [id number] CC: Wound Infection (Right arm)  HPI: [name] is a 82 y.o. male presenting for Wound Infection (Right arm)  Fell 3-5 days ago getting up in the middle of the night getting up to use the bathroom Wife used gauze to stop the bleeding Now gauze is stuck to the wound He wouldn't let her change the dressing at home Some pain around area, minimal discharge since the event He has a urinal at the bedside, is supposed to move slowly at night but sometimes rushes to get to the bathroom Usually follows with Dr. Elease Hashimoto On xarelto deies any other injury in the fall, did not hit his head, no LOC  Relevant past medical, surgical, family and social history reviewed. Allergies and medications reviewed and updated. History  Smoking Status  . Former Smoker  . Packs/day: 0.00  . Years: 0.00  . Types: Cigarettes  . Quit date: 06/13/1983  Smokeless Tobacco  . Never Used   ROS: Per HPI   Objective:    BP (!) 146/81   Pulse 98   Temp 97.2 F (36.2 C) (Oral)   Ht 5\' 11"  (1.803 m)   Wt 149 lb (67.6 kg)   BMI 20.78 kg/m   Wt Readings from Last 3 Encounters:  03/27/16 149 lb (67.6 kg)  02/12/16 142 lb 14.4 oz (64.8 kg)  02/02/16 146 lb (66.2 kg)    Gen: NAD, alert, cooperative with exam, NCAT EYES: EOMI, no conjunctival injection, or no icterus Ext: No edema, warm Neuro: Alert  Skin: two areas of minor skin tears R forearm and R elbow. Dried gauze over both wounds. When removed minimal drainage present over forearm, no redness, no induration or tenderness, appear well-healing  Assessment & Plan:  Smit was seen today for superficial abrasions R arm, well healing. Do not appear infected. Gauze was removed, wounds redressed, discussed wound care with wife. Pt to use urinal at night or wake someone to help him get to bathroom. Recently discharged home from [facility], just went through PT  there. Pt will f/u with PCP.  Diagnoses and all orders for this visit:  Fall, initial encounter  Wound of right upper extremity, initial encounter   Follow up plan: Prn, follow up with PCP as soon as possible Assunta Found, MD [place]

## 2016-03-28 NOTE — Telephone Encounter (Signed)
I called the pharmacy and informed Marcie Bal the pharmacist of the conflict in dosage and she stated they only have on file an Rx for Gabapentin 600mg . I left a message for the pt to return my call.

## 2016-03-28 NOTE — Telephone Encounter (Signed)
Patient's wife called back and stated the medication bottle reads Gabapentin 600mg  to take 1 three times a day and this was given by Dr Elease Hashimoto on 02/08/2016.  Message sent to Dr Maudie Mercury since Dr Elease Hashimoto is out of the office.

## 2016-03-28 NOTE — Telephone Encounter (Signed)
I called the pts wife and informed her the Rx was sent to the pts pharmacy.

## 2016-03-28 NOTE — Telephone Encounter (Signed)
If taking 600mg  tid currently. Ok to continue current dose and refill x 1 month in PCP absence. Thanks.

## 2016-03-28 NOTE — Telephone Encounter (Signed)
Pharmacy calling to check the status of the Rx gabapentin that was sent in on 10/19.  Pharm:  [facility]

## 2016-03-29 ENCOUNTER — Other Ambulatory Visit: Payer: Self-pay

## 2016-03-29 MED ORDER — GABAPENTIN 600 MG PO TABS: ORAL_TABLET | ORAL | 3 refills | Status: DC

## 2016-04-03 ENCOUNTER — Other Ambulatory Visit: Payer: Self-pay | Admitting: Family Medicine

## 2016-04-03 ENCOUNTER — Other Ambulatory Visit: Payer: Self-pay | Admitting: Internal Medicine

## 2016-04-04 ENCOUNTER — Other Ambulatory Visit: Payer: Self-pay

## 2016-04-04 MED ORDER — IPRATROPIUM BROMIDE 0.02 % IN SOLN: RESPIRATORY_TRACT | 3 refills | Status: DC

## 2016-04-22 ENCOUNTER — Ambulatory Visit (INDEPENDENT_AMBULATORY_CARE_PROVIDER_SITE_OTHER): Payer: Medicare Other | Admitting: Physician Assistant

## 2016-04-22 ENCOUNTER — Encounter: Payer: Self-pay | Admitting: Physician Assistant

## 2016-04-22 ENCOUNTER — Other Ambulatory Visit: Payer: Self-pay | Admitting: Internal Medicine

## 2016-04-22 VITALS — BP 146/77 | HR 79 | Ht 71.0 in | Wt 153.4 lb

## 2016-04-22 DIAGNOSIS — E785 Hyperlipidemia, unspecified: Secondary | ICD-10-CM

## 2016-04-22 DIAGNOSIS — I480 Paroxysmal atrial fibrillation: Secondary | ICD-10-CM

## 2016-04-22 DIAGNOSIS — J449 Chronic obstructive pulmonary disease, unspecified: Secondary | ICD-10-CM | POA: Diagnosis not present

## 2016-04-22 DIAGNOSIS — Z79899 Other long term (current) drug therapy: Secondary | ICD-10-CM

## 2016-04-22 DIAGNOSIS — I2510 Atherosclerotic heart disease of native coronary artery without angina pectoris: Secondary | ICD-10-CM | POA: Diagnosis not present

## 2016-04-22 DIAGNOSIS — I2699 Other pulmonary embolism without acute cor pulmonale: Secondary | ICD-10-CM

## 2016-04-22 DIAGNOSIS — I10 Essential (primary) hypertension: Secondary | ICD-10-CM | POA: Diagnosis not present

## 2016-04-22 LAB — CBC
HCT: 29.7 % — ABNORMAL LOW (ref 38.5–50.0)
Hemoglobin: 8.6 g/dL — ABNORMAL LOW (ref 13.2–17.1)
MCH: 22.6 pg — ABNORMAL LOW (ref 27.0–33.0)
MCHC: 29 g/dL — AB (ref 32.0–36.0)
MCV: 78.2 fL — ABNORMAL LOW (ref 80.0–100.0)
MPV: 9.5 fL (ref 7.5–12.5)
PLATELETS: 251 10*3/uL (ref 140–400)
RBC: 3.8 MIL/uL — ABNORMAL LOW (ref 4.20–5.80)
RDW: 16.1 % — AB (ref 11.0–15.0)
WBC: 5.3 10*3/uL (ref 3.8–10.8)

## 2016-04-22 LAB — BASIC METABOLIC PANEL
BUN: 15 mg/dL (ref 7–25)
CALCIUM: 8.3 mg/dL — AB (ref 8.6–10.3)
CO2: 27 mmol/L (ref 20–31)
Chloride: 109 mmol/L (ref 98–110)
Creat: 0.86 mg/dL (ref 0.70–1.11)
Glucose, Bld: 84 mg/dL (ref 65–99)
POTASSIUM: 4.4 mmol/L (ref 3.5–5.3)
SODIUM: 142 mmol/L (ref 135–146)

## 2016-04-22 MED ORDER — FUROSEMIDE 20 MG PO TABS: 20.0000 mg | ORAL_TABLET | ORAL | 6 refills | Status: DC | PRN

## 2016-04-22 NOTE — Patient Instructions (Signed)
Medication Instructions:  Continue current medications  Labwork: CBC and BMP  Testing/Procedures: None Ordered  Follow-Up: Your physician wants you to follow-up in: 6 Months with Dr Rayann Heman. You will receive a reminder letter in the mail two months in advance. If you don't receive a letter, please call our office to schedule the follow-up appointment.   Any Other Special Instructions Will Be Listed Below (If Applicable).        Happy Thanksgiving   If you need a refill on your cardiac medications before your next appointment, please call your pharmacy.

## 2016-04-22 NOTE — Progress Notes (Signed)
Cardiology Office Note    Date:  04/23/2016   ID:  [name], DOB [birth year], MRN [id number]  PCP:  [name], MD  Cardiologist:  Dr. Rayann Heman  Chief Complaint  Patient presents with  . Follow-up    seen for Dr. Rayann Heman    History of Present Illness:  [name] is a 82 y.o. male with PMH of COPD, ASD s/p repair 1983, nonobstructive CAD by cath in 2009 and 09/2014, GERD, history of LV dysfunction, hypertension, hyperlipidemia, PAF and history of upper GI bleed. He is on Coumadin with atrial fibrillation and denies any bleeding or falls. Last cardiac catheterization performed on 09/06/2014 showed mild diffuse coronary artery disease, LVEDP 12 mmHg. He was admitted in August 2017 for acute shortness of breath. His INR was subtherapeutic at 1.95. Initial lactic acid was 2.64. Pro calcitonin was negative. CP of chest obtained on 01/11/2016 showed left-sided pulmonary emboli with some degree of right heart strain, nonocclusive, enlarged pulmonary arteries consistent with pulmonary hypertension. He was started on IV heparin and Cardizem drip. Coumadin was stopped and switched to Xarelto later. He has been doing well since he left the hospital in August. He is no longer having any shortness of breath. He has no lower extremity edema. He did run out of his PRN Lasix which seems to be the main reason he came to the office today. He is accompanied by his wife. He denies any chest discomfort, shortness breath, lower extremity edema, orthopnea or PND episode. According to his wife, he use Lasix roughly 50% of the days in a single month. Wife has been managing the volume status very well, however is concerned about now his Lasix is running out, he may have reaccumulation of fluid.     Past Medical History:  Diagnosis Date  . Anemia   . ASD (atrial septal defect)    a. s/p repair 1983.  [name] BPH (benign prostatic hyperplasia)   . Cerebrovascular accident ([place]) [place]  . Chronic bronchitis ([place])     a. h/o resp failure in setting of bronchitis vs PNA 05/2012.  [name] Chronic diarrhea   . COPD (chronic obstructive pulmonary disease) ([place])   . Coronary artery disease    a. Nonobstructive by cath 2009 and 09/06/14  . Depression   . Dysphagia, unspecified(787.20)   . Erosive gastritis    H/o GIB felt secondary to this  . Esophagitis   . Gastric polyp   . Gastric ulcer   . GERD (gastroesophageal reflux disease)   . Hearing impairment   . Hiatal hernia   . History of upper gastrointestinal hemorrhage    dr. Maurene Capes, GI  . Hyperlipidemia   . Hypertension   . Internal hemorrhoids   . LV dysfunction    a. EF 45-50% by echo 05/2012.  [name] Permanent atrial fibrillation (HCC)    [facility].  [name] Vision problems     Past Surgical History:  Procedure Laterality Date  . ASD REPAIR  1983  . CARDIOVERSION     possibly 3 times, dr. brody/cooper  . CHOLECYSTECTOMY  1990s  . ENTEROSCOPY  05/13/2012   Procedure: ENTEROSCOPY;  Surgeon: Inda Castle, MD;  Location: [facility] ENDOSCOPY;  Service: Endoscopy;  Laterality: N/A;  . [place]  . LEFT HEART CATHETERIZATION WITH CORONARY ANGIOGRAM N/A 09/06/2014   Procedure: LEFT HEART CATHETERIZATION WITH CORONARY ANGIOGRAM;  Surgeon: Jettie Booze, MD;  Location: [facility] CATH LAB;  Service: Cardiovascular;  Laterality: N/A;  .  partial small bowel resection  2005   ischemic?    Current Medications: Outpatient Medications Prior to Visit  Medication Sig Dispense Refill  . albuterol (PROVENTIL HFA;VENTOLIN HFA) 108 (90 BASE) MCG/ACT inhaler Inhale 2 puffs into the lungs every 6 (six) hours as needed for wheezing or shortness of breath. 1 Inhaler 2  . dutasteride (AVODART) 0.5 MG capsule TAKE (1) CAPSULE DAILY 30 capsule 5  . gabapentin (NEURONTIN) 600 MG tablet TAKE  (1)  TABLET  THREE TIMES DAILY. 90 tablet 3  . ipratropium (ATROVENT) 0.02 % nebulizer solution NEBULIZE 1 VAIL 4 TIMES A DAY AS DIRECTED 312.5 mL 3  . levalbuterol  (XOPENEX) 0.63 MG/3ML nebulizer solution NEBULIZE 1 VIAL (WITH IPRATROPIUM) TWICE A DAY AS NEEDED. MAY USE EXTR A EVERY 4 HOURS IF NEEDED 216 mL 2  . mirtazapine (REMERON) 15 MG tablet TAKE 1 TABLET DAILY. 90 tablet 1  . mupirocin ointment (BACTROBAN) 2 % Apply 1 application topically 2 (two) times daily.    . nitroGLYCERIN (NITROSTAT) 0.4 MG SL tablet Place 1 tablet (0.4 mg total) under the tongue every 5 (five) minutes x 3 doses as needed for chest pain. 25 tablet 3  . potassium chloride (K-DUR) 10 MEQ tablet TAKE (2) TABLETS TWICE DAILY. (Patient taking differently: Take 2 tablets (20 mEq) by mouth two times a day) 120 tablet 5  . rivaroxaban (XARELTO) 20 MG TABS tablet Take 1 tablet (20 mg total) by mouth daily with supper. 30 tablet 6  . rivastigmine (EXELON) 4.6 mg/24hr PLACE 1 PATCH ONTO SKIN DAILY AS DIRECTED 30 patch 5  . saccharomyces boulardii (FLORASTOR) 250 MG capsule Take 250 mg by mouth every morning.     . simvastatin (ZOCOR) 20 MG tablet     . vitamin B-12 (CYANOCOBALAMIN) 500 MCG tablet Take 500 mcg by mouth daily.    . furosemide (LASIX) 20 MG tablet TAKE (1) TABLET BY MOUTH AS NEEDED FOR EDEMA 30 tablet 0  . gabapentin (NEURONTIN) 300 MG capsule Take 1 capsule (300 mg total) by mouth 3 (three) times daily.    . isosorbide mononitrate (IMDUR) 30 MG 24 hr tablet Take 1 tablet (30 mg total) by mouth daily. (Patient taking differently: Take 30 mg by mouth at bedtime (night)) 30 tablet 11   No facility-administered medications prior to visit.      Allergies:   Lovenox [enoxaparin sodium]; Tape; and Penicillins   Social History   Social History  . Marital status: Married    Spouse name: N/A  . Number of children: N/A  . Years of education: N/A   Social History Main Topics  . Smoking status: Former Smoker    Packs/day: 0.00    Years: 0.00    Types: Cigarettes    Quit date: 06/13/1983  . Smokeless tobacco: Never Used  . Alcohol use No  . Drug use: No  . Sexual activity:  Not Asked   Other Topics Concern  . None   Social History Narrative   Lives in [place] with wife.  Several falls recently, 4 in the last month.        Nizaiah Schoepp  [phone]                 Family History:  The patient's family history includes Heart disease in his father and mother; Prostate cancer in his brother.   ROS:   Please see the history of present illness.    ROS All other systems reviewed and are negative.   PHYSICAL  EXAM:   VS:  BP (!) 146/77   Pulse 79   Ht 5\' 11"  (1.803 m)   Wt 153 lb 6.4 oz (69.6 kg)   BMI 21.39 kg/m    GEN: Well nourished, well developed, in no acute distress  HEENT: normal  Neck: no JVD, carotid bruits, or masses Cardiac: RRR; no murmurs, rubs, or gallops,no edema  Respiratory:  clear to auscultation bilaterally, normal work of breathing GI: soft, nontender, nondistended, + BS MS: no deformity or atrophy  Skin: warm and dry, no rash Neuro:  Alert and Oriented x 3, Strength and sensation are intact Psych: euthymic mood, full affect  Wt Readings from Last 3 Encounters:  04/22/16 153 lb 6.4 oz (69.6 kg)  03/27/16 149 lb (67.6 kg)  02/12/16 142 lb 14.4 oz (64.8 kg)      Studies/Labs Reviewed:   EKG:  EKG is ordered today.  The ekg ordered today demonstrates Atrial fibrillation with a heart rate of 79, occasional PVC  Recent Labs: 11/07/2015: TSH 1.55 01/11/2016: ALT 17; B Natriuretic Peptide 86.2 01/12/2016: Magnesium 1.8 01/15/2016: BUN 38; Creatinine, Ser 1.00; Hemoglobin 10.4; Platelets 165; Potassium 4.2; Sodium 137   Lipid Panel    Component Value Date/Time   CHOL 101 11/07/2015 0902   TRIG 107.0 11/07/2015 0902   HDL 43.80 11/07/2015 0902   CHOLHDL 2 11/07/2015 0902   VLDL 21.4 11/07/2015 0902   LDLCALC 36 11/07/2015 0902   LDLDIRECT 48.8 07/30/2013 1551    Additional studies/ records that were reviewed today include:   Cath 09/06/2014 IMPRESSIONS:  1. Mild diffuse coronary artery disease as noted  above. 2. LVEDP 12 mmHg.    RECOMMENDATION:  No significant coronary artery disease. Continue aggressive medical therapy and risk factor modification. Resume warfarin. Monitor right radial site for bleeding given the catheterization.        Echo 01/12/2016 LV EF: 55% -   60%   - Left ventricle: The cavity size was normal. Wall thickness was   increased in a pattern of mild LVH. Systolic function was normal.   The estimated ejection fraction was in the range of 55% to 60%.   Wall motion was normal; there were no regional wall motion   abnormalities. Doppler parameters are consistent with high   ventricular filling pressure. - Mitral valve: Calcified annulus. There was mild regurgitation. - Left atrium: The atrium was severely dilated. - Right ventricle: The cavity size was moderately dilated. Systolic   function was moderately reduced. - Right atrium: The atrium was severely dilated. - Pulmonary arteries: Systolic pressure was moderately increased.   PA peak pressure: 52 mm Hg (S).  Impressions:  - Normal LV systolic function; mild LVH; elevated LV filling   pressure; severe biatrial enlargement; moderate RVE with reduced   function; mild MR; mild TR with moderately elevated pulmonary   pressure.  ASSESSMENT:    1. Essential hypertension   2. Medication management   3. Coronary artery disease involving native coronary artery of native heart without angina pectoris   4. Chronic obstructive pulmonary disease, unspecified COPD type ([place])   5. Hyperlipidemia, unspecified hyperlipidemia type   6. PAF (paroxysmal atrial fibrillation) (HCC)   7. Other acute pulmonary embolism without acute cor pulmonale (HCC)      PLAN:  In order of problems listed above:  1. CAD: No obvious angina, she has been on PRN Lasix, will refill his Lasix, follow up in 6 months. Overall doing quite well. No significant sign of fluid overload  on physical exam.  2. HTN: Blood pressure  well-controlled   3. HLD: On Zocor  4. Persistent atrial fibrillation: Rate controlled on current medication. Last EKG in August 2017 shows atrial fibrillation.  5. Recent PE: Diagnosed in August, his Coumadin level was subtherapeutic at the time, he was started on Xarelto.    Medication Adjustments/Labs and Tests Ordered: Current medicines are reviewed at length with the patient today.  Concerns regarding medicines are outlined above.  Medication changes, Labs and Tests ordered today are listed in the Patient Instructions below. Patient Instructions  Medication Instructions:  Continue current medications  Labwork: CBC and BMP  Testing/Procedures: None Ordered  Follow-Up: Your physician wants you to follow-up in: 6 Months with Dr Rayann Heman. You will receive a reminder letter in the mail two months in advance. If you don't receive a letter, please call our office to schedule the follow-up appointment.   Any Other Special Instructions Will Be Listed Below (If Applicable).        Happy Thanksgiving   If you need a refill on your cardiac medications before your next appointment, please call your pharmacy.      Hilbert Corrigan, [place]  04/23/2016 12:01 AM    [address], [place], [place]  [address] Phone: [phone]; Fax: [phone]

## 2016-04-23 ENCOUNTER — Telehealth: Payer: Self-pay | Admitting: *Deleted

## 2016-04-23 DIAGNOSIS — D649 Anemia, unspecified: Secondary | ICD-10-CM

## 2016-04-23 NOTE — Telephone Encounter (Signed)
Returned [facility] call. I had to put orders for repeat cbc and stool culture in under and addendum to the o/v 04-22-16 due to EP[employer] wouldn't let her release it in order for [name] to get the results.

## 2016-04-23 NOTE — Addendum Note (Signed)
Addended by: Gaetano Net on: 04/23/2016 12:44 PM   Modules accepted: Orders

## 2016-04-23 NOTE — Telephone Encounter (Signed)
Follow up   ° ° °Office calling back to speak with nurse.   °

## 2016-04-23 NOTE — Telephone Encounter (Signed)
Spoke with Kerin Ransom, PA-C re: low hgb in [facility], PA-C basket.  Per Lurena Joiner, have pt get stool culture, starting today, and repeat cbc 1 week.  I called [facility], since pt is a mutual pt, and pt will go by there and pick up stool kit and got back 1 week for repeat cbc.  Called pt, lm for him to call back.

## 2016-04-24 NOTE — Telephone Encounter (Signed)
Tried calling pt back re: lab results. Pt and wife both have been made aware that pt needs to go by [facility] and pick up stool card and then back next week for repeat cbc to recheck hgb.  They verbalized understanding.

## 2016-04-29 ENCOUNTER — Other Ambulatory Visit: Payer: Self-pay | Admitting: *Deleted

## 2016-04-29 DIAGNOSIS — D649 Anemia, unspecified: Secondary | ICD-10-CM

## 2016-04-30 ENCOUNTER — Other Ambulatory Visit: Payer: Medicare Other

## 2016-04-30 DIAGNOSIS — D649 Anemia, unspecified: Secondary | ICD-10-CM

## 2016-05-02 ENCOUNTER — Telehealth: Payer: Self-pay | Admitting: *Deleted

## 2016-05-02 DIAGNOSIS — Z8719 Personal history of other diseases of the digestive system: Secondary | ICD-10-CM

## 2016-05-02 LAB — FECAL OCCULT BLOOD, IMMUNOCHEMICAL: FECAL OCCULT BLD: POSITIVE — AB

## 2016-05-02 NOTE — Telephone Encounter (Signed)
Spoke with pt wife, DPR on file, and she has been made aware that pts stool card was + and to expect a call from GI re: referral for an appt to r/o GI bleed.  She verbalized understanding.

## 2016-05-03 ENCOUNTER — Encounter: Payer: Self-pay | Admitting: Physician Assistant

## 2016-05-09 ENCOUNTER — Encounter: Payer: Self-pay | Admitting: Physician Assistant

## 2016-05-09 ENCOUNTER — Other Ambulatory Visit (INDEPENDENT_AMBULATORY_CARE_PROVIDER_SITE_OTHER): Payer: Medicare Other

## 2016-05-09 ENCOUNTER — Other Ambulatory Visit: Payer: Self-pay

## 2016-05-09 ENCOUNTER — Ambulatory Visit (INDEPENDENT_AMBULATORY_CARE_PROVIDER_SITE_OTHER): Payer: Medicare Other | Admitting: Physician Assistant

## 2016-05-09 VITALS — BP 128/60 | HR 68 | Ht 71.0 in | Wt 149.8 lb

## 2016-05-09 DIAGNOSIS — R195 Other fecal abnormalities: Secondary | ICD-10-CM

## 2016-05-09 DIAGNOSIS — D649 Anemia, unspecified: Secondary | ICD-10-CM | POA: Diagnosis not present

## 2016-05-09 DIAGNOSIS — Z7901 Long term (current) use of anticoagulants: Secondary | ICD-10-CM | POA: Diagnosis not present

## 2016-05-09 DIAGNOSIS — D509 Iron deficiency anemia, unspecified: Secondary | ICD-10-CM

## 2016-05-09 LAB — FOLATE: Folate: 19 ng/mL (ref >5.9)

## 2016-05-09 LAB — VITAMIN B12: VITAMIN B 12: 457 pg/mL (ref 211–911)

## 2016-05-09 LAB — FERRITIN: Ferritin: 4.5 ng/mL — ABNORMAL LOW (ref 22.0–322.0)

## 2016-05-09 LAB — IBC PANEL
IRON: 21 ug/dL — AB (ref 42–165)
Saturation Ratios: 4.3 % — ABNORMAL LOW (ref 20.0–50.0)
Transferrin: 352 mg/dL (ref 212.0–360.0)

## 2016-05-09 NOTE — Progress Notes (Signed)
Chief Complaint: Hemoccult + stool, Anemia, Fatigue  HPI:  [name] is an 82 year old Caucasian male, past medical history of atrial septal defect repair in 1983, CVA, COPD, CAD (last echo 01/12/16 with an LVEF of 55-60%), GERD, A. Fib, maintained on Xarelto for a pulmonary embolism in September, who was referred to me by [name], MD for a complaint of anemia and Hemoccult-positive stools .     Patient has been followed by Dr. Olevia Perches in the past. He did have a small bowel enteroscopy on 05/13/12 for melenic stool with findings of linear gastric erosions and large hiatal hernia as well as an esophageal stricture. Last colonoscopy was 04/14/06 with a finding of diverticula in the sigmoid colon and a thickened spastic haustral folds and was otherwise normal.    Patient recently had a CBC 04/22/16 with a finding of a hemoglobin low at 8.6, compared to 11.8 three months ago. Patient also did Hemoccult studies which were positive on 04/30/16.   Today, the patient presents to clinic accompanied by his wife and tells me that he feels "drained and fatigued". He describes that he is "not sure how I am supposed to feel at 82 years old". Overall his wife tells me that he "moans and groans" all the time. Feeling bad has become part of his daily routine according to her and the patient. He tells me today at time of exam that he is not having any acute GI symptoms, but has just been feeling fatigued. He does feel as though he has a decreased appetite, but "just doesn't feel like eating".  He is aware that there is a finding of blood in his stool but he has not been seeing any at home. He also denies any melenic-appearing stool. Patient's wife described that he was changed from Coumadin to Xarelto earlier this year after hospitalization for pulmonary embolus.   Past medical history is significant for a previous GI bleed in 2013. At that time the patient was having "black stools" per pt report.   Patient  denies fever, chills, seeing bright red blood or black stools, weight loss, anorexia, nausea, vomiting, heartburn, reflux, abdominal pain, change in bowel habits or symptoms that awaken him at night.  Past Medical History:  Diagnosis Date  . Anemia   . ASD (atrial septal defect)    a. s/p repair 1983.  [name] BPH (benign prostatic hyperplasia)   . Cerebrovascular accident ([place]) [place]  . Chronic bronchitis ([place])    a. h/o resp failure in setting of bronchitis vs PNA 05/2012.  [name] Chronic diarrhea   . COPD (chronic obstructive pulmonary disease) ([place])   . Coronary artery disease    a. Nonobstructive by cath 2009 and 09/06/14  . Depression   . Dysphagia, unspecified(787.20)   . Erosive gastritis    H/o GIB felt secondary to this  . Esophagitis   . Gastric polyp   . Gastric ulcer   . GERD (gastroesophageal reflux disease)   . Hearing impairment   . Hiatal hernia   . History of upper gastrointestinal hemorrhage    dr. Maurene Capes, GI  . Hyperlipidemia   . Hypertension   . Internal hemorrhoids   . LV dysfunction    a. EF 45-50% by echo 05/2012.  [name] Permanent atrial fibrillation (HCC)    INR followed at [facility].  [name] Vision problems     Past Surgical History:  Procedure Laterality Date  . ASD REPAIR  1983  . CARDIOVERSION  possibly 3 times, dr. brody/cooper  . CHOLECYSTECTOMY  1990s  . ENTEROSCOPY  05/13/2012   Procedure: ENTEROSCOPY;  Surgeon: Inda Castle, MD;  Location: [facility];  Service: Endoscopy;  Laterality: N/A;  . [place]  . LEFT HEART CATHETERIZATION WITH CORONARY ANGIOGRAM N/A 09/06/2014   Procedure: LEFT HEART CATHETERIZATION WITH CORONARY ANGIOGRAM;  Surgeon: Jettie Booze, MD;  Location: [facility] CATH LAB;  Service: Cardiovascular;  Laterality: N/A;  . partial small bowel resection  2005   ischemic?    Current Outpatient Prescriptions  Medication Sig Dispense Refill  . albuterol (PROVENTIL HFA;VENTOLIN HFA) 108 (90 BASE) MCG/ACT inhaler Inhale 2  puffs into the lungs every 6 (six) hours as needed for wheezing or shortness of breath. 1 Inhaler 2  . dutasteride (AVODART) 0.5 MG capsule TAKE (1) CAPSULE DAILY 30 capsule 5  . furosemide (LASIX) 20 MG tablet Take 1 tablet (20 mg total) by mouth as needed. For edema 30 tablet 6  . gabapentin (NEURONTIN) 600 MG tablet TAKE  (1)  TABLET  THREE TIMES DAILY. 90 tablet 3  . ipratropium (ATROVENT) 0.02 % nebulizer solution NEBULIZE 1 VAIL 4 TIMES A DAY AS DIRECTED 312.5 mL 3  . isosorbide mononitrate (IMDUR) 30 MG 24 hr tablet Take 1 tablet (30 mg total) by mouth daily. 30 tablet 11  . levalbuterol (XOPENEX) 0.63 MG/3ML nebulizer solution NEBULIZE 1 VIAL (WITH IPRATROPIUM) TWICE A DAY AS NEEDED. MAY USE EXTR A EVERY 4 HOURS IF NEEDED 216 mL 2  . mirtazapine (REMERON) 15 MG tablet TAKE 1 TABLET DAILY. 90 tablet 1  . mupirocin ointment (BACTROBAN) 2 % Apply 1 application topically 2 (two) times daily.    . nitroGLYCERIN (NITROSTAT) 0.4 MG SL tablet Place 1 tablet (0.4 mg total) under the tongue every 5 (five) minutes x 3 doses as needed for chest pain. 25 tablet 3  . potassium chloride (K-DUR) 10 MEQ tablet TAKE (2) TABLETS TWICE DAILY. (Patient taking differently: Take 2 tablets (20 mEq) by mouth two times a day) 120 tablet 5  . rivaroxaban (XARELTO) 20 MG TABS tablet Take 1 tablet (20 mg total) by mouth daily with supper. 30 tablet 6  . rivastigmine (EXELON) 4.6 mg/24hr PLACE 1 PATCH ONTO SKIN DAILY AS DIRECTED 30 patch 5  . saccharomyces boulardii (FLORASTOR) 250 MG capsule Take 250 mg by mouth every morning.     . simvastatin (ZOCOR) 20 MG tablet     . vitamin B-12 (CYANOCOBALAMIN) 500 MCG tablet Take 500 mcg by mouth daily.     No current facility-administered medications for this visit.     Allergies as of 05/09/2016 - Review Complete 05/09/2016  Allergen Reaction Noted  . Lovenox [enoxaparin sodium] Swelling 02/02/2016  . Tape Other (See Comments) 01/11/2016  . Penicillins Hives      Family History  Problem Relation Age of Onset  . Heart disease Mother   . Heart disease Father   . Prostate cancer Brother     Social History   Social History  . Marital status: Married    Spouse name: N/A  . Number of children: N/A  . Years of education: N/A   Occupational History  . Not on file.   Social History Main Topics  . Smoking status: Former Smoker    Packs/day: 0.00    Years: 0.00    Types: Cigarettes    Quit date: 06/13/1983  . Smokeless tobacco: Never Used  . Alcohol use No  . Drug use: No  .  Sexual activity: Not on file   Other Topics Concern  . Not on file   Social History Narrative   Lives in [place] with wife.  Several falls recently, 4 in the last month.        [name]  [phone]                Review of Systems:     Constitutional: Positive for fatigue No weight loss, fever or chills HEENT: Eyes: No change in vision               Ears, Nose, Throat:  Positive for chronic problems with hearing Skin: Positive for skin rash Cardiovascular: No chest pain Respiratory: Positive for shortness of breath  Gastrointestinal: See HPI and otherwise negative Neurological: No dizziness or syncope Musculoskeletal: No new muscle or joint pain Hematologic: No bleeding  Psychiatric: No history of depression or anxiety   Physical Exam:  Vital signs: BP 128/60   Pulse 68   Ht 5\' 11"  (1.803 m)   Wt 149 lb 12.8 oz (67.9 kg)   BMI 20.89 kg/m   Constitutional: Elderly, frail-appearing, pale Caucasian male appears to be in NAD, Well developed, Well nourished, alert and cooperative Head:  Normocephalic and atraumatic. Eyes:   PEERL, EOMI. No icterus. Conjunctiva pink. Ears:  Normal auditory acuity. Neck:  Supple Throat: Oral cavity and pharynx without inflammation, swelling or lesion.  Respiratory: Respirations even and unlabored. Lungs clear to auscultation bilaterally.   No wheezes, crackles, or rhonchi.  Cardiovascular: Normal S1, S2. +  murmur Regular rate and rhythm. No peripheral edema, cyanosis or pallor.  Gastrointestinal:  Soft, nondistended, nontender. No rebound or guarding. Normal bowel sounds. No appreciable masses or hepatomegaly. Rectal:  Not performed.  Msk:  Symmetrical without gross deformities. Without edema, no deformity or joint abnormality.  Neurologic:  Alert and  oriented x4;  grossly normal neurologically.  Skin:   Dry and intact without significant lesions or rashes. Psychiatric:  Demonstrates good judgement and reason without abnormal affect or behaviors.  RELEVANT LABS AND IMAGING: CBC    Component Value Date/Time   WBC 5.3 04/22/2016 1623   RBC 3.80 (L) 04/22/2016 1623   HGB 8.6 (L) 04/22/2016 1623   HCT 29.7 (L) 04/22/2016 1623   PLT 251 04/22/2016 1623   MCV 78.2 (L) 04/22/2016 1623   MCH 22.6 (L) 04/22/2016 1623   MCHC 29.0 (L) 04/22/2016 1623   RDW 16.1 (H) 04/22/2016 1623   LYMPHSABS 1.1 01/11/2016 1608   MONOABS 0.2 01/11/2016 1608   EOSABS 0.1 01/11/2016 1608   BASOSABS 0.0 01/11/2016 1608    CMP     Component Value Date/Time   NA 142 04/22/2016 1623   K 4.4 04/22/2016 1623   CL 109 04/22/2016 1623   CO2 27 04/22/2016 1623   GLUCOSE 84 04/22/2016 1623   BUN 15 04/22/2016 1623   CREATININE 0.86 04/22/2016 1623   CALCIUM 8.3 (L) 04/22/2016 1623   PROT 7.3 01/11/2016 1608   ALBUMIN 4.1 01/11/2016 1608   AST 26 01/11/2016 1608   ALT 17 01/11/2016 1608   ALKPHOS 58 01/11/2016 1608   BILITOT 0.6 01/11/2016 1608   GFRNONAA >60 01/15/2016 0438   GFRAA >60 01/15/2016 0438    Assessment: 1. Anemia, unspecified: No recent iron studies, hemoglobin low, fecal occult positive cards and need to rule out other types of anemia before eagerly pursuing endoscopic procedures as the patient is quite frail and is of advanced age, making these procedures higher risk,  especially as the patient has history of B12 deficiency in the past 2. Positive fecal Hemoccult: Consider hemorrhoids versus  other 3. Chronic anticoagulation: With Xarelto for recent pulmonary embolism  Plan: 1. At this time discussed EGD and colonoscopy with the patient and his wife. He would like to delay these procedures if possible. He is aware of the risks of delayed diagnosis of things such as colon cancer. 2. At this time will order further labs to decipher what type of anemia the patient is experiencing. Ordered anemia profile including iron studies as well as B12 and folate  3. Patient tells me that if his iron is low he would like to be on an iron supplement over 3-4 weeks and have recheck of his labs prior to having any endoscopic evaluation. 4. If patient's iron does not improve on oral iron supplementation, recommend that he proceed with EGD and colonoscopy. At that time we will need to discuss holding his Xarelto for 2 days. 5. Also discussed with the patient that we could do further imaging including a virtual colonoscopy if he does not want to proceed with colonoscopy at this time.  6. Recommend the patient follow in clinic in the next 3-4 weeks with Dr. Silverio Decamp, or myself, at that time would recommend recheck of his iron studies and labs 7. Patient was advised that if he sees blood in his stool, black stools, becomes syncopal or dizzy or has an increase of symptoms he should proceed to the ER  Ellouise Newer, PA-C [facility] 05/09/2016, 10:24 AM  Cc: [name], MD

## 2016-05-09 NOTE — Patient Instructions (Signed)
Please go to the basement level to have your labs drawn.   We made you an appointment with Dr. Silverio Decamp on 06-26-2016.

## 2016-05-10 ENCOUNTER — Other Ambulatory Visit: Payer: Self-pay | Admitting: Family Medicine

## 2016-05-16 NOTE — Progress Notes (Signed)
Reviewed and agree with documentation and assessment and plan. K. Veena Nandigam , MD   

## 2016-06-17 ENCOUNTER — Ambulatory Visit (INDEPENDENT_AMBULATORY_CARE_PROVIDER_SITE_OTHER): Payer: Medicare Other | Admitting: Family Medicine

## 2016-06-17 VITALS — BP 140/96 | HR 120 | Ht 71.0 in | Wt 145.0 lb

## 2016-06-17 DIAGNOSIS — B356 Tinea cruris: Secondary | ICD-10-CM | POA: Diagnosis not present

## 2016-06-17 DIAGNOSIS — D509 Iron deficiency anemia, unspecified: Secondary | ICD-10-CM

## 2016-06-17 LAB — IBC PANEL
Iron: 29 ug/dL — ABNORMAL LOW (ref 42–165)
SATURATION RATIOS: 6.2 % — AB (ref 20.0–50.0)
Transferrin: 332 mg/dL (ref 212.0–360.0)

## 2016-06-17 LAB — CBC WITH DIFFERENTIAL/PLATELET
BASOS PCT: 0.3 % (ref 0.0–3.0)
Basophils Absolute: 0 10*3/uL (ref 0.0–0.1)
EOS PCT: 1.7 % (ref 0.0–5.0)
Eosinophils Absolute: 0.1 10*3/uL (ref 0.0–0.7)
HCT: 36.8 % — ABNORMAL LOW (ref 39.0–52.0)
HEMOGLOBIN: 11.8 g/dL — AB (ref 13.0–17.0)
Lymphocytes Relative: 9.9 % — ABNORMAL LOW (ref 12.0–46.0)
Lymphs Abs: 0.6 10*3/uL — ABNORMAL LOW (ref 0.7–4.0)
MCHC: 32.2 g/dL (ref 30.0–36.0)
MCV: 80.9 fl (ref 78.0–100.0)
MONO ABS: 0.4 10*3/uL (ref 0.1–1.0)
Monocytes Relative: 6.6 % (ref 3.0–12.0)
NEUTROS ABS: 5.1 10*3/uL (ref 1.4–7.7)
Neutrophils Relative %: 81.5 % — ABNORMAL HIGH (ref 43.0–77.0)
Platelets: 233 10*3/uL (ref 150.0–400.0)
RBC: 4.55 Mil/uL (ref 4.22–5.81)
RDW: 26.1 % — ABNORMAL HIGH (ref 11.5–15.5)
WBC: 6.3 10*3/uL (ref 4.0–10.5)

## 2016-06-17 LAB — FERRITIN: Ferritin: 11 ng/mL — ABNORMAL LOW (ref 22.0–322.0)

## 2016-06-17 LAB — FOLATE: FOLATE: 16.9 ng/mL (ref >5.9)

## 2016-06-17 LAB — VITAMIN B12: Vitamin B-12: 577 pg/mL (ref 211–911)

## 2016-06-17 NOTE — Progress Notes (Signed)
Subjective:     Patient ID: [name], male   DOB: [birth year], 83 y.o.   MRN: [id number]  HPI Patient has multiple chronic problems including history of pulmonary emboli, hypertension, atrial fibrillation, CAD, congestive heart failure, cerebrovascular disease, COPD, GERD, remote history of gastric ulcer, history of erosive esophagitis, chronic diarrhea, chronic kidney disease. He is here with his wife today to discuss recent anemia issues. Patient is a poor historian. His wife relates most of the history. He saw cardiology November 20 and had multiple labs done which included hemoglobin of 8.6. This was down from previous hemoglobin back in August of 11.8. Patient had not noted any obvious bloody stools or abdominal pain. Had some chronic decreased appetite and weight has fluctuated tremendously over the past several years. He eventually had multiple other studies done which included Hemoccults which were positive, ferritin of 4.5, normal B12 level, low iron saturation.  Was recommended he take over-the-counter iron sulfate 325 mg twice daily. Wife states that he took this he had increased diarrhea and could not tolerate even one tablet daily. He took this overall for about 2 weeks.  He was seen by GI and they discussed pros and cons of EGD and colonoscopy. Patient is on chronic anticoagulation.  He also has fairly extensive bilateral groin rash. He has occasional urine incontinence which he's had for years and wears adult diapers much of the time which is very likely contributing. They've used antifungal cream in the past with some success.  Past Medical History:  Diagnosis Date  . Anemia   . ASD (atrial septal defect)    a. s/p repair 1983.  [name] BPH (benign prostatic hyperplasia)   . Cerebrovascular accident ([place]) [place]  . Chronic bronchitis ([place])    a. h/o resp failure in setting of bronchitis vs PNA 05/2012.  [name] Chronic diarrhea   . COPD (chronic obstructive pulmonary disease) ([place])   .  Coronary artery disease    a. Nonobstructive by cath 2009 and 09/06/14  . Depression   . Dysphagia, unspecified(787.20)   . Erosive gastritis    H/o GIB felt secondary to this  . Esophagitis   . Gastric polyp   . Gastric ulcer   . GERD (gastroesophageal reflux disease)   . Hearing impairment   . Hiatal hernia   . History of upper gastrointestinal hemorrhage    dr. Maurene Capes, GI  . Hyperlipidemia   . Hypertension   . Internal hemorrhoids   . LV dysfunction    a. EF 45-50% by echo 05/2012.  [name] Permanent atrial fibrillation (HCC)    [facility] at [facility].  [name] Vision problems    Past Surgical History:  Procedure Laterality Date  . ASD REPAIR  1983  . CARDIOVERSION     possibly 3 times, dr. brody/cooper  . CHOLECYSTECTOMY  1990s  . ENTEROSCOPY  05/13/2012   Procedure: ENTEROSCOPY;  Surgeon: Inda Castle, MD;  Location: [facility];  Service: Endoscopy;  Laterality: N/A;  . [place]  . LEFT HEART CATHETERIZATION WITH CORONARY ANGIOGRAM N/A 09/06/2014   Procedure: LEFT HEART CATHETERIZATION WITH CORONARY ANGIOGRAM;  Surgeon: Jettie Booze, MD;  Location: [facility] CATH LAB;  Service: Cardiovascular;  Laterality: N/A;  . partial small bowel resection  2005   ischemic?    reports that he quit smoking about 33 years ago. His smoking use included Cigarettes. He smoked 0.00 packs per day for 0.00 years. He has never used smokeless tobacco. He reports that he  does not drink alcohol or use drugs. family history includes Heart disease in his father and mother; Prostate cancer in his brother. Allergies  Allergen Reactions  . Lovenox [Enoxaparin Sodium] Swelling  . Tape Other (See Comments)    SKIN WILL TEAR IF TAPED (MUST BE LIGHT & BRIEF OR USE COBAN WRAP, PLEASE!!)  . Penicillins Hives    Has patient had a PCN reaction causing immediate rash, facial/tongue/throat swelling, SOB or lightheadedness with hypotension: Yes Has patient had a PCN reaction causing severe rash  involving mucus membranes or skin necrosis: No Has patient had a PCN reaction that required hospitalization: Unknown Has patient had a PCN reaction occurring within the last 10 years: No If all of the above answers are "NO", then may proceed with Cephalosporin use.      Review of Systems  Constitutional: Positive for fatigue. Negative for chills and fever.  Respiratory: Negative for cough.   Cardiovascular: Negative for chest pain.  Gastrointestinal: Positive for diarrhea. Negative for abdominal pain, blood in stool, nausea, rectal pain and vomiting.  Genitourinary: Negative for dysuria.  Skin: Positive for rash.  Neurological: Negative for dizziness and syncope.       Objective:   Physical Exam  Constitutional: He appears well-developed and well-nourished.  Cardiovascular: Normal rate.   Irregular rhythm but rate controlled  Pulmonary/Chest: Effort normal and breath sounds normal. No respiratory distress. He has no wheezes. He has no rales.  Abdominal: Soft. There is no tenderness.  Musculoskeletal: He exhibits no edema.  Neurological: He is alert.  Skin: Rash noted.  Macular erythematous rash right and left groin regions.       Assessment:     #1 iron deficiency anemia. Patient intolerant of oral iron supplementation. He was able to take a couple weeks .  Recent hemoccult positive stool.    #2 remote history of gastric ulcer   #3 chronic anticoagulation secondary to atrial fibrillation   #4 bilateral groin rash -suspect candida    Plan:     -He will get repeat labs today which were already ordered by GI which include follow-up iron studies and CBC  -They are strongly encouraged to continue follow-up with GI as scheduled at the end of the month  -Get back on topical antifungal cream with clotrimazole -Try to avoid occlusion and moisture to groin areas as much as possible  [name] MD [facility]

## 2016-06-17 NOTE — Patient Instructions (Signed)
Iron Deficiency Anemia, Adult Iron deficiency anemia is a condition in which the concentration of red blood cells or hemoglobin in the blood is below normal because of too little iron. Hemoglobin is a substance in red blood cells that carries oxygen to the body's tissues. When the concentration of red blood cells or hemoglobin is too low, not enough oxygen reaches these tissues. Iron deficiency anemia is usually long-lasting (chronic) and it develops over time. It may or may not cause symptoms. It is a common type of anemia. What are the causes? This condition may be caused by:  Not enough iron in the diet.  Blood loss caused by bleeding in the intestine.  Blood loss from a gastrointestinal condition like Crohn disease.  Frequent blood draws, such as from blood donation.  Abnormal absorption in the gut.  Heavy menstrual periods in women.  Cancers of the gastrointestinal system, such as colon cancer. What are the signs or symptoms? Symptoms of this condition may include:  Fatigue.  Headache.  Pale skin, lips, and nail beds.  Poor appetite.  Weakness.  Shortness of breath.  Dizziness.  Cold hands and feet.  Fast or irregular heartbeat.  Irritability. This is more common in severe anemia.  Rapid breathing. This is more common in severe anemia. Mild anemia may not cause any symptoms. How is this diagnosed? This condition is diagnosed based on:  Your medical history.  A physical exam.  Blood tests. You may have additional tests to find the underlying cause of your anemia, such as:  Testing for blood in the stool (fecal occult blood test).  A procedure to see inside your colon and rectum (colonoscopy).  A procedure to see inside your esophagus and stomach (endoscopy).  A test in which cells are removed from bone marrow (bone marrow aspiration) or fluid is removed from the bone marrow to be examined (biopsy). This is rarely needed. How is this treated? This  condition is treated by correcting the cause of your iron deficiency. Treatment may involve:  Adding iron-rich foods to your diet.  Taking iron supplements. If you are pregnant or breastfeeding, you may need to take extra iron because your normal diet usually does not provide the amount of iron that you need.  Increasing vitamin C intake. Vitamin C helps your body absorb iron. Your health care provider may recommend that you take iron supplements along with a glass of orange juice or a vitamin C supplement.  Medicines to make heavy menstrual flow lighter.  Surgery. You may need repeat blood tests to determine whether treatment is working. Depending on the underlying cause, the anemia should be corrected within 2 months of starting treatment. If the treatment does not seem to be working, you may need more testing. Follow these instructions at home: Medicines  Take over-the-counter and prescription medicines only as told by your health care provider. This includes iron supplements and vitamins.  If you cannot tolerate taking iron supplements by mouth, talk with your health care provider about taking them through a vein (intravenously) or an injection into a muscle.  For the best iron absorption, you should take iron supplements when your stomach is empty. If you cannot tolerate them on an empty stomach, you may need to take them with food.  Do not drink milk or take antacids at the same time as your iron supplements. Milk and antacids may interfere with iron absorption.  Iron supplements can cause constipation. To prevent constipation, include fiber in your diet as told   by your health care provider. A stool softener may also be recommended. Eating and drinking  Talk with your health care provider before changing your diet. He or she may recommend that you eat foods that contain a lot of iron, such as:  Liver.  Low-fat (lean) beef.  Breads and cereals that have iron added to them (are  fortified).  Eggs.  Dried fruit.  Dark green, leafy vegetables.  To help your body use the iron from iron-rich foods, eat those foods at the same time as fresh fruits and vegetables that are high in vitamin C. Foods that are high in vitamin C include:  Oranges.  Peppers.  Tomatoes.  Mangoes.  Drinkenoughfluid to keep your urine clear or pale yellow. General instructions  Return to your normal activities as told by your health care provider. Ask your health care provider what activities are safe for you.  Practice good hygiene. Anemia can make you more prone to illness and infection.  Keep all follow-up visits as told by your health care provider. This is important. Contact a health care provider if:  You feel nauseous or you vomit.  You feel weak.  You have unexplained sweating.  You develop symptoms of constipation, such as:  Having fewer than three bowel movements a week.  Straining to have a bowel movement.  Having stools that are hard, dry, or larger than normal.  Feeling full or bloated.  Pain in the lower abdomen.  Not feeling relief after having a bowel movement. Get help right away if:  You faint. If this happens, do not drive yourself to the hospital. Call your local emergency services (911 in the [place]  You have chest pain.  You have shortness of breath that:  Is severe.  Gets worse with physical activity.  You have a rapid heartbeat.  You become light-headed when getting up from a sitting or lying down position. This information is not intended to replace advice given to you by your health care provider. Make sure you discuss any questions you have with your health care provider. Document Released: 05/17/2000 Document Revised: 02/07/2016 Document Reviewed: 02/07/2016 Elsevier Interactive Patient Education  2017 [employer]  

## 2016-06-17 NOTE — Progress Notes (Signed)
Pre visit review using our clinic review tool, if applicable. No additional management support is needed unless otherwise documented below in the visit note. 

## 2016-06-18 ENCOUNTER — Other Ambulatory Visit: Payer: Self-pay

## 2016-06-18 DIAGNOSIS — D509 Iron deficiency anemia, unspecified: Secondary | ICD-10-CM

## 2016-06-26 ENCOUNTER — Other Ambulatory Visit: Payer: Self-pay | Admitting: Family Medicine

## 2016-06-26 ENCOUNTER — Ambulatory Visit: Payer: Medicare Other | Admitting: Gastroenterology

## 2016-07-01 ENCOUNTER — Encounter: Payer: Self-pay | Admitting: Gastroenterology

## 2016-07-01 ENCOUNTER — Ambulatory Visit (INDEPENDENT_AMBULATORY_CARE_PROVIDER_SITE_OTHER): Payer: Medicare Other | Admitting: Gastroenterology

## 2016-07-01 VITALS — BP 150/90 | HR 84 | Ht 66.5 in | Wt 145.4 lb

## 2016-07-01 DIAGNOSIS — K449 Diaphragmatic hernia without obstruction or gangrene: Secondary | ICD-10-CM

## 2016-07-01 DIAGNOSIS — D508 Other iron deficiency anemias: Secondary | ICD-10-CM | POA: Diagnosis not present

## 2016-07-01 DIAGNOSIS — R195 Other fecal abnormalities: Secondary | ICD-10-CM

## 2016-07-01 DIAGNOSIS — Z8719 Personal history of other diseases of the digestive system: Secondary | ICD-10-CM | POA: Diagnosis not present

## 2016-07-01 DIAGNOSIS — Z8711 Personal history of peptic ulcer disease: Secondary | ICD-10-CM

## 2016-07-01 MED ORDER — SODIUM CHLORIDE 0.9 % IV SOLN: 510.0000 mg | INTRAVENOUS | Status: AC

## 2016-07-01 MED ORDER — PANTOPRAZOLE SODIUM 40 MG PO TBEC: 40.0000 mg | DELAYED_RELEASE_TABLET | Freq: Every day | ORAL | 11 refills | Status: DC

## 2016-07-01 NOTE — Patient Instructions (Addendum)
We will call you with your iron infusion appointment    You will have CBC, Ferritin and Iron panel done 4 weeks after your infusion.   1st Feraheme injection 07/08/2016 at 10am

## 2016-07-01 NOTE — Progress Notes (Signed)
[name]    [id number]    [birth year]  Primary Care 19 W, MD  Referring Physician: [name], MD [place], [place] [address]  Chief complaint: Iron deficiency anemia  HPI: 62 yr F multiple comorbidities including COPD, CAD, atrial septal defect repair, CVA, A. fib, and pulmonary embolism in September 2017, currently on Xarelto here for follow-up of iron deficiency anemia with Hemoccult positive stools. Reviewed prior EGD, colonoscopy and small bowel enteroscopy reports. He has a large hiatal hernia and gastric erosions on last enteroscopy in December 2013. Hemoglobin has improved from 8.6 in November to baseline 11.8 based on labs done on January 15,2018. He is taking oral iron infrequently as it alters his bowel habits. Denies any black stools or blood per rectum.  He has no other specific GI complaints.   Outpatient Encounter Prescriptions as of 07/01/2016  Medication Sig  . albuterol (PROVENTIL HFA;VENTOLIN HFA) 108 (90 BASE) MCG/ACT inhaler Inhale 2 puffs into the lungs every 6 (six) hours as needed for wheezing or shortness of breath.  . clotrimazole-betamethasone (LOTRISONE) cream APPLY TO AFFECTED AREAS TWICE A DAY  . dutasteride (AVODART) 0.5 MG capsule TAKE (1) CAPSULE DAILY  . furosemide (LASIX) 20 MG tablet Take 1 tablet (20 mg total) by mouth as needed. For edema  . gabapentin (NEURONTIN) 600 MG tablet TAKE  (1)  TABLET  THREE TIMES DAILY.  [name] ipratropium (ATROVENT) 0.02 % nebulizer solution NEBULIZE 1 VAIL 4 TIMES A DAY AS DIRECTED  . isosorbide mononitrate (IMDUR) 30 MG 24 hr tablet Take 1 tablet (30 mg total) by mouth daily.  [name] levalbuterol (XOPENEX) 0.63 MG/3ML nebulizer solution NEBULIZE 1 VIAL (WITH IPRATROPIUM) TWICE A DAY AS NEEDED. MAY USE EXTR A EVERY 4 HOURS IF NEEDED  . mirtazapine (REMERON) 15 MG tablet TAKE 1 TABLET DAILY.  . mupirocin ointment (BACTROBAN) 2 % Apply 1 application topically 2 (two)  times daily.  . nitroGLYCERIN (NITROSTAT) 0.4 MG SL tablet Place 1 tablet (0.4 mg total) under the tongue every 5 (five) minutes x 3 doses as needed for chest pain.  . potassium chloride (K-DUR) 10 MEQ tablet TAKE (2) TABLETS TWICE DAILY. (Patient taking differently: Take 2 tablets (20 mEq) by mouth two times a day)  . rivaroxaban (XARELTO) 20 MG TABS tablet Take 1 tablet (20 mg total) by mouth daily with supper.  . rivastigmine (EXELON) 4.6 mg/24hr PLACE 1 PATCH ONTO SKIN DAILY AS DIRECTED  . saccharomyces boulardii (FLORASTOR) 250 MG capsule Take 250 mg by mouth every morning.   . simvastatin (ZOCOR) 20 MG tablet   . vitamin B-12 (CYANOCOBALAMIN) 500 MCG tablet Take 500 mcg by mouth daily.   No facility-administered encounter medications on file as of 07/01/2016.     Allergies as of 07/01/2016 - Review Complete 07/01/2016  Allergen Reaction Noted  . Lovenox [enoxaparin sodium] Swelling 02/02/2016  . Tape Other (See Comments) 01/11/2016  . Penicillins Hives     Past Medical History:  Diagnosis Date  . Anemia   . ASD (atrial septal defect)    a. s/p repair 1983.  [name] BPH (benign prostatic hyperplasia)   . Cerebrovascular accident ([place]) [place]  . Chronic bronchitis ([place])    a. h/o resp failure in setting of bronchitis vs PNA 05/2012.  [name] Chronic diarrhea   . COPD (chronic obstructive pulmonary disease) ([place])   . Coronary artery disease    a. Nonobstructive by cath 2009 and 09/06/14  .  Depression   . Dysphagia, unspecified(787.20)   . Erosive gastritis    H/o GIB felt secondary to this  . Esophagitis   . Gastric polyp   . Gastric ulcer   . GERD (gastroesophageal reflux disease)   . Hearing impairment   . Hiatal hernia   . History of upper gastrointestinal hemorrhage    dr. Maurene [name], GI  . Hyperlipidemia   . Hypertension   . Internal hemorrhoids   . LV dysfunction    a. EF 45-50% by echo 05/2012.  [name] Permanent atrial fibrillation (HCC)    INR followed at [facility].  [name] Vision problems       Past Surgical History:  Procedure Laterality Date  . ASD REPAIR  1983  . CARDIOVERSION     possibly 3 times, dr. brody/cooper  . CHOLECYSTECTOMY  1990s  . ENTEROSCOPY  05/13/2012   Procedure: ENTEROSCOPY;  Surgeon: Darrell Castle, MD;  Location: [facility] ENDOSCOPY;  Service: Endoscopy;  Laterality: N/A;  . [place]  . LEFT HEART CATHETERIZATION WITH CORONARY ANGIOGRAM N/A 09/06/2014   Procedure: LEFT HEART CATHETERIZATION WITH CORONARY ANGIOGRAM;  Surgeon: Darrell Booze, MD;  Location: [facility] LAB;  Service: Cardiovascular;  Laterality: N/A;  . partial small bowel resection  2005   ischemic?    Family History  Problem Relation Age of Onset  . Heart disease Mother   . Heart disease Father   . Prostate cancer Brother     Social History   Social History  . Marital status: Married    Spouse name: N/A  . Number of children: N/A  . Years of education: N/A   Occupational History  . Not on file.   Social History Main Topics  . Smoking status: Former Smoker    Packs/day: 0.00    Years: 0.00    Types: Cigarettes    Quit date: 06/13/1983  . Smokeless tobacco: Never Used  . Alcohol use No  . Drug use: No  . Sexual activity: Not on file   Other Topics Concern  . Not on file   Social History Narrative   Lives in [place] with wife.  Several falls recently, 4 in the last month.        [name]  [phone]                  Review of systems: Review of Systems  Constitutional: Negative for fever and chills. Lack of energy HENT: Negative.   Eyes: Negative for blurred vision.  Respiratory: Negative for cough, shortness of breath and wheezing.   Cardiovascular: Negative for chest pain and palpitations.  Gastrointestinal: as per HPI Genitourinary: Negative for dysuria, urgency, frequency and hematuria.  Musculoskeletal: Negative for myalgias, back pain and joint pain.  Skin: Negative for itching and rash.  Neurological: Negative for  dizziness, tremors, focal weakness, seizures and loss of consciousness.  Endo/Heme/Allergies: Positive for seasonal allergies.  Psychiatric/Behavioral: Negative for depression, suicidal ideas and hallucinations.  All other systems reviewed and are negative.   Physical Exam: Vitals:   07/01/16 1431  BP: (!) 150/90  Pulse: 84   Body mass index is 23.11 kg/m. Gen:      No acute distress, frail, elderly HEENT:  EOMI, sclera anicteric Neck:     No masses; no thyromegaly Lungs:    Clear to auscultation bilaterally; normal respiratory effort CV:         Regular rate and rhythm; no murmurs Abd:      +  bowel sounds; soft, non-tender; no palpable masses, no distension Ext:    No edema; adequate peripheral perfusion Skin:      Warm and dry; no rash Neuro: alert and oriented x 3 Psych: normal mood and affect  Data Reviewed:  Reviewed labs, radiology imaging, old records and pertinent past GI work up   Assessment and Plan/Recommendations:  83 year old male with multiple comorbidities here with her follow-up of iron deficiency anemia with heme positive stool Hemoglobin has improved from 8.6 to11.8 without any definitive intervention He is taking oral iron intermittently Ferritin continues to be low 11.0 Discussed in detail the benefits and risks associated with endoscopic evaluation of iron deficiency anemia with Hemoccult positive stool. He is frail elderly male with multiple comorbidities and is at increased risk related to procedure and anesthesia He does not want to proceed with endoscopic evaluation Most likely etiology gastritis, gastric ulcers, Cameron erosions in the setting of large hiatal hernia but cannot exclude GI tract malignancy. Patient and his wife are aware of possible differential diagnosis and does not want to pursue further We'll send patient for ferraheme infusion as he is not able to tolerate oral iron therapy to improve ferritin Recheck CBC, iron panel and ferritin  level 4 weeks after ferraheme infusion to decide if he needs to receive any additional infusions Continue Xarelto given h/o recent Pulmonary Embolism Advised patient that f he notices any signs of overt GI bleed or dizziness or weakness will contact our office or proceed to ER for further evaluation Start Protonix 40 mg daily, 30 minutes before breakfast Small frequent meals   25 minutes was spent face-to-face with the patient. Greater than 50% of the time used for counseling as well as treatment plan and follow-up. She had multiple questions which were answered to her satisfaction  K. Denzil Magnuson , MD [phone] Mon-Fri 8a-5p [phone] after 5p, weekends, holidays  CC: Burchette, Alinda Sierras, MD

## 2016-07-05 ENCOUNTER — Other Ambulatory Visit: Payer: Self-pay

## 2016-07-05 DIAGNOSIS — D509 Iron deficiency anemia, unspecified: Secondary | ICD-10-CM

## 2016-07-08 ENCOUNTER — Ambulatory Visit (HOSPITAL_COMMUNITY)
Admission: RE | Admit: 2016-07-08 | Discharge: 2016-07-08 | Disposition: A | Discharge status: Home / Self Care | Payer: Medicare Other | Source: Ambulatory Visit | Attending: Gastroenterology | Admitting: Gastroenterology

## 2016-07-08 DIAGNOSIS — D509 Iron deficiency anemia, unspecified: Secondary | ICD-10-CM | POA: Insufficient documentation

## 2016-07-08 MED ORDER — FERUMOXYTOL INJECTION 510 MG/17 ML
510.0000 mg | INTRAVENOUS | Status: DC
  Administered 2016-07-08: 11:00:00 510 mg via INTRAVENOUS
  Filled 2016-07-08: qty 17

## 2016-07-08 NOTE — Discharge Instructions (Signed)
Ferumoxytol injection What is this medicine? FERUMOXYTOL is an iron complex. Iron is used to make healthy red blood cells, which carry oxygen and nutrients throughout the body. This medicine is used to treat iron deficiency anemia in people with chronic kidney disease. COMMON BRAND NAME(S): Feraheme What should I tell my health care provider before I take this medicine? They need to know if you have any of these conditions: -anemia not caused by low iron levels -high levels of iron in the blood -magnetic resonance imaging (MRI) test scheduled -an unusual or allergic reaction to iron, other medicines, foods, dyes, or preservatives -pregnant or trying to get pregnant -breast-feeding How should I use this medicine? This medicine is for injection into a vein. It is given by a health care professional in a hospital or clinic setting. Talk to your pediatrician regarding the use of this medicine in children. Special care may be needed. What if I miss a dose? It is important not to miss your dose. Call your doctor or health care professional if you are unable to keep an appointment. What may interact with this medicine? This medicine may interact with the following medications: -other iron products What should I watch for while using this medicine? Visit your doctor or healthcare professional regularly. Tell your doctor or healthcare professional if your symptoms do not start to get better or if they get worse. You may need blood work done while you are taking this medicine. You may need to follow a special diet. Talk to your doctor. Foods that contain iron include: whole grains/cereals, dried fruits, beans, or peas, leafy green vegetables, and organ meats (liver, kidney). What side effects may I notice from receiving this medicine? Side effects that you should report to your doctor or health care professional as soon as possible: -allergic reactions like skin rash, itching or hives, swelling of the  face, lips, or tongue -breathing problems -changes in blood pressure -feeling faint or lightheaded, falls -fever or chills -flushing, sweating, or hot feelings -swelling of the ankles or feet Side effects that usually do not require medical attention (report to your doctor or health care professional if they continue or are bothersome): -diarrhea -headache -nausea, vomiting -stomach pain Where should I keep my medicine? This drug is given in a hospital or clinic and will not be stored at home.  2017 Elsevier/Gold Standard (2015-06-22 12:41:49)  

## 2016-07-15 ENCOUNTER — Other Ambulatory Visit: Payer: Self-pay | Admitting: Family Medicine

## 2016-07-15 ENCOUNTER — Ambulatory Visit (HOSPITAL_COMMUNITY)
Admission: RE | Admit: 2016-07-15 | Discharge: 2016-07-15 | Disposition: A | Discharge status: Home / Self Care | Payer: Medicare Other | Source: Ambulatory Visit | Attending: Gastroenterology | Admitting: Gastroenterology

## 2016-07-15 DIAGNOSIS — D509 Iron deficiency anemia, unspecified: Secondary | ICD-10-CM | POA: Insufficient documentation

## 2016-07-15 MED ORDER — SODIUM CHLORIDE 0.9 % IV SOLN
510.0000 mg | INTRAVENOUS | Status: AC
  Administered 2016-07-15: 10:00:00 510 mg via INTRAVENOUS
  Filled 2016-07-15: qty 17

## 2016-07-16 ENCOUNTER — Ambulatory Visit (INDEPENDENT_AMBULATORY_CARE_PROVIDER_SITE_OTHER): Payer: Medicare Other | Admitting: Family Medicine

## 2016-07-16 ENCOUNTER — Encounter: Payer: Self-pay | Admitting: Family Medicine

## 2016-07-16 VITALS — BP 140/90 | HR 73 | Ht 72.0 in | Wt 147.0 lb

## 2016-07-16 DIAGNOSIS — M25511 Pain in right shoulder: Secondary | ICD-10-CM

## 2016-07-16 NOTE — Progress Notes (Signed)
Pre visit review using our clinic review tool, if applicable. No additional management support is needed unless otherwise documented below in the visit note. 

## 2016-07-16 NOTE — Progress Notes (Signed)
Subjective:     Patient ID: [name], male   DOB: 1932-06-12, 83 y.o.   MRN: [id number]  HPI Patient seen with right proximal humerus and right shoulder pain following a fall which occurred on February 1. Patient is a poor historian and cannot give details. His wife states he fell in the kitchen. There is no syncope. He thinks he had tripped on his feet. He noticed some pain immediately but refused to be evaluated and has not been evaluated until now. They noticed some ecchymosis and neck today involving the proximal humerus region. He has pain with internal rotation or abduction. He's had very little movement of the shoulder because of pain since then. He has frequent night pain. No neck pain. He is on chronic anticoagulation and avoids nonsteroidals. They tried some icing initially. Denies any other injuries  Past Medical History:  Diagnosis Date  . Anemia   . ASD (atrial septal defect)    a. s/p repair 1983.  [name] BPH (benign prostatic hyperplasia)   . Cerebrovascular accident ([place]) [place]  . Chronic bronchitis ([place])    a. h/o resp failure in setting of bronchitis vs PNA 05/2012.  [name] Chronic diarrhea   . COPD (chronic obstructive pulmonary disease) ([place])   . Coronary artery disease    a. Nonobstructive by cath 2009 and 09/06/14  . Depression   . Dysphagia, unspecified(787.20)   . Erosive gastritis    H/o GIB felt secondary to this  . Esophagitis   . Gastric polyp   . Gastric ulcer   . GERD (gastroesophageal reflux disease)   . Hearing impairment   . Hiatal hernia   . History of upper gastrointestinal hemorrhage    dr. Maurene Capes, GI  . Hyperlipidemia   . Hypertension   . Internal hemorrhoids   . LV dysfunction    a. EF 45-50% by echo 05/2012.  [name] Permanent atrial fibrillation (HCC)    [facility]R [facility].  [name] Vision problems    Past Surgical History:  Procedure Laterality Date  . ASD REPAIR  1983  . CARDIOVERSION     possibly 3 times, dr. brody/cooper  . CHOLECYSTECTOMY  1990s   . ENTEROSCOPY  05/13/2012   Procedure: ENTEROSCOPY;  Surgeon: Inda Castle, MD;  Location[facility] ENDOSCOPY;  Service: Endoscopy;  Laterality: N/A;  . [place]  . LEFT HEART CATHETERIZATION WITH CORONARY ANGIOGRAM N/A 09/06/2014   Procedure: LEFT HEART CATHETERIZATION WITH CORONARY ANGIOGRAM;  Surgeon: Jettie Booze, MD;  Location: [facility] CATH LAB;  Service: Cardiovascular;  Laterality: N/A;  . partial small bowel resection  2005   ischemic?    reports that he quit smoking about 33 years ago. His smoking use included Cigarettes. He smoked 0.00 packs per day for 0.00 years. He has never used smokeless tobacco. He reports that he does not drink alcohol or use drugs. family history includes Heart disease in his father and mother; Prostate cancer in his brother. Allergies  Allergen Reactions  . Lovenox [Enoxaparin Sodium] Swelling  . Tape Other (See Comments)    SKIN WILL TEAR IF TAPED (MUST BE LIGHT & BRIEF OR USE COBAN WRAP, PLEASE!!)  . Penicillins Hives    Has patient had a PCN reaction causing immediate rash, facial/tongue/throat swelling, SOB or lightheadedness with hypotension: Yes Has patient had a PCN reaction causing severe rash involving mucus membranes or skin necrosis: No Has patient had a PCN reaction that required hospitalization: Unknown Has patient had a PCN reaction  occurring within the last 10 years: No If all of the above answers are "NO", then may proceed with Cephalosporin use.      Review of Systems  Constitutional: Negative for appetite change, chills and fever.  Cardiovascular: Negative for chest pain.  Neurological: Negative for dizziness and syncope.       Objective:   Physical Exam  Constitutional: He appears well-developed and well-nourished.  Cardiovascular: Normal rate.   Pulmonary/Chest: Effort normal and breath sounds normal. No respiratory distress. He has no wheezes. He has no rales.  Musculoskeletal:  Patient has approximate 6  x 6 cm area of ecchymosis right lateral proximal arm. No hematoma. Minimal tenderness proximal humerus region. No clavicle tenderness. No AC joint tenderness. Limited range of motion right shoulder secondary to pain. He has question of some rotator cuff weakness on the right versus the left versus limited effort secondary to pain       Assessment:     Right shoulder pain and proximal humerus pain following fall. Rule out acute bony injury such as proximal humerus fracture. High-risk for rotator cuff injury. High-risk for adhesive capsulitis    Plan:     -Obtain x-rays of the right shoulder to further assess -If no evidence for fracture consider physical therapy to start some increased mobility -May need further assessment to rule out rotator cuff tear-although patient would be a very poor candidate for surgery even if he had a rotator cuff tear  [name] MD  Primary Care at [facility]

## 2016-07-18 ENCOUNTER — Ambulatory Visit (INDEPENDENT_AMBULATORY_CARE_PROVIDER_SITE_OTHER)
Admission: RE | Admit: 2016-07-18 | Discharge: 2016-07-18 | Disposition: A | Discharge status: Home / Self Care | Payer: Medicare Other | Source: Ambulatory Visit | Attending: Family Medicine | Admitting: Family Medicine

## 2016-07-18 DIAGNOSIS — M25511 Pain in right shoulder: Secondary | ICD-10-CM

## 2016-07-19 ENCOUNTER — Encounter: Payer: Self-pay | Admitting: Family Medicine

## 2016-07-22 ENCOUNTER — Other Ambulatory Visit: Payer: Self-pay | Admitting: Family Medicine

## 2016-07-29 ENCOUNTER — Other Ambulatory Visit: Payer: Self-pay | Admitting: Family Medicine

## 2016-08-05 ENCOUNTER — Other Ambulatory Visit: Payer: Self-pay | Admitting: Family Medicine

## 2016-10-10 ENCOUNTER — Other Ambulatory Visit: Payer: Self-pay | Admitting: Family Medicine

## 2016-10-23 ENCOUNTER — Other Ambulatory Visit: Payer: Self-pay | Admitting: Family Medicine

## 2016-11-13 ENCOUNTER — Other Ambulatory Visit: Payer: Self-pay | Admitting: Family Medicine

## 2016-12-03 ENCOUNTER — Other Ambulatory Visit: Payer: Self-pay | Admitting: Family Medicine

## 2016-12-10 ENCOUNTER — Other Ambulatory Visit: Payer: Self-pay | Admitting: Family Medicine

## 2016-12-12 ENCOUNTER — Other Ambulatory Visit: Payer: Self-pay | Admitting: Family Medicine

## 2016-12-13 NOTE — Telephone Encounter (Signed)
Refill for 6 months. 

## 2016-12-30 ENCOUNTER — Other Ambulatory Visit: Payer: Self-pay | Admitting: Family Medicine

## 2017-01-18 ENCOUNTER — Other Ambulatory Visit: Payer: Self-pay | Admitting: Family Medicine

## 2017-01-22 ENCOUNTER — Other Ambulatory Visit: Payer: Self-pay | Admitting: Family Medicine

## 2017-01-22 NOTE — Telephone Encounter (Signed)
Spoke with wife per DPR and an appointment made.  rx sent.

## 2017-01-22 NOTE — Telephone Encounter (Signed)
Refill OK but pt needs office follow up.

## 2017-01-22 NOTE — Telephone Encounter (Signed)
Last office visit 07/16/16.  Patient needs labs for potassium level.  Okay to refill?

## 2017-01-24 ENCOUNTER — Encounter: Payer: Self-pay | Admitting: Family Medicine

## 2017-01-24 ENCOUNTER — Ambulatory Visit (INDEPENDENT_AMBULATORY_CARE_PROVIDER_SITE_OTHER): Payer: Medicare Other | Admitting: Family Medicine

## 2017-01-24 VITALS — BP 140/88 | HR 86 | Temp 98.3°F | Wt 148.4 lb

## 2017-01-24 DIAGNOSIS — I2510 Atherosclerotic heart disease of native coronary artery without angina pectoris: Secondary | ICD-10-CM

## 2017-01-24 DIAGNOSIS — F329 Major depressive disorder, single episode, unspecified: Secondary | ICD-10-CM

## 2017-01-24 DIAGNOSIS — D509 Iron deficiency anemia, unspecified: Secondary | ICD-10-CM | POA: Diagnosis not present

## 2017-01-24 DIAGNOSIS — I10 Essential (primary) hypertension: Secondary | ICD-10-CM | POA: Diagnosis not present

## 2017-01-24 DIAGNOSIS — F32A Depression, unspecified: Secondary | ICD-10-CM

## 2017-01-24 LAB — CBC WITH DIFFERENTIAL/PLATELET
BASOS ABS: 39 {cells}/uL (ref 0–200)
Basophils Relative: 1 %
Eosinophils Absolute: 78 cells/uL (ref 15–500)
Eosinophils Relative: 2 %
HEMATOCRIT: 45.3 % (ref 38.5–50.0)
HEMOGLOBIN: 15 g/dL (ref 13.2–17.1)
LYMPHS ABS: 936 {cells}/uL (ref 850–3900)
Lymphocytes Relative: 24 %
MCH: 30.5 pg (ref 27.0–33.0)
MCHC: 33.1 g/dL (ref 32.0–36.0)
MCV: 92.1 fL (ref 80.0–100.0)
MONO ABS: 429 {cells}/uL (ref 200–950)
MPV: 10.2 fL (ref 7.5–12.5)
Monocytes Relative: 11 %
NEUTROS PCT: 62 %
Neutro Abs: 2418 cells/uL (ref 1500–7800)
Platelets: 152 10*3/uL (ref 140–400)
RBC: 4.92 MIL/uL (ref 4.20–5.80)
RDW: 13.4 % (ref 11.0–15.0)
WBC: 3.9 10*3/uL (ref 3.8–10.8)

## 2017-01-24 MED ORDER — BUPROPION HCL ER (XL) 150 MG PO TB24: 150.0000 mg | ORAL_TABLET | Freq: Every day | ORAL | 6 refills | Status: DC

## 2017-01-24 NOTE — Progress Notes (Signed)
Subjective:     Patient ID: [name], male   DOB: 1932-08-28, 83 y.o.   MRN: [id number]  HPI Patient has multiple chronic problems including hypertension, CAD, history of atrial fibrillation, history of CVA, diastolic heart failure, remote history of pulmonary emboli, history of iron deficiency anemia and upper GI bleed, GERD, and depression.  Patient is a poor historian. His wife is his caregiver. She states he has very low energy and very low initiative. She takes may still be depressed. He denies suicidal ideation. Currently taking Remeron 7.5 mg at night. Other medications reviewed. Compliant with all-with exception of gabapentin which she apparently stopped taking taking this was not helping. He's had some history of neuropathy symptoms in the past.  Overdue for labs.   Past Medical History:  Diagnosis Date  . Anemia   . ASD (atrial septal defect)    a. s/p repair 1983.  [name] BPH (benign prostatic hyperplasia)   . Cerebrovascular accident ([place]) [place]  . Chronic bronchitis ([place])    a. h/o resp failure in setting of bronchitis vs PNA 05/2012.  [name] Chronic diarrhea   . COPD (chronic obstructive pulmonary disease) ([place])   . Coronary artery disease    a. Nonobstructive by cath 2009 and 09/06/14  . Depression   . Dysphagia, unspecified(787.20)   . Erosive gastritis    H/o GIB felt secondary to this  . Esophagitis   . Gastric polyp   . Gastric ulcer   . GERD (gastroesophageal reflux disease)   . Hearing impairment   . Hiatal hernia   . History of upper gastrointestinal hemorrhage    dr. Maurene Capes, GI  . Hyperlipidemia   . Hypertension   . Internal hemorrhoids   . LV dysfunction    a. EF 45-50% by echo 05/2012.  [name] Permanent atrial fibrillation (HCC)    [facility] at [facility].  [name] Vision problems    Past Surgical History:  Procedure Laterality Date  . ASD REPAIR  1983  . CARDIOVERSION     possibly 3 times, dr. brody/cooper  . CHOLECYSTECTOMY  1990s  . ENTEROSCOPY  05/13/2012   Procedure: ENTEROSCOPY;  Surgeon: Inda Castle, MD;  [facility] ENDOSCOPY;  Service: Endoscopy;  Laterality: N/A;  . [place]  . LEFT HEART CATHETERIZATION WITH CORONARY ANGIOGRAM N/A 09/06/2014   Procedure: LEFT HEART CATHETERIZATION WITH CORONARY ANGIOGRAM;  Surgeon: Jettie Booze, MD;  Location: [facility] CATH LAB;  Service: Cardiovascular;  Laterality: N/A;  . partial small bowel resection  2005   ischemic?    reports that he quit smoking about 33 years ago. His smoking use included Cigarettes. He smoked 0.00 packs per day for 0.00 years. He has never used smokeless tobacco. He reports that he does not drink alcohol or use drugs. family history includes Heart disease in his father and mother; Prostate cancer in his brother. Allergies  Allergen Reactions  . Lovenox [Enoxaparin Sodium] Swelling  . Tape Other (See Comments)    SKIN WILL TEAR IF TAPED (MUST BE LIGHT & BRIEF OR USE COBAN WRAP, PLEASE!!)  . Penicillins Hives    Has patient had a PCN reaction causing immediate rash, facial/tongue/throat swelling, SOB or lightheadedness with hypotension: Yes Has patient had a PCN reaction causing severe rash involving mucus membranes or skin necrosis: No Has patient had a PCN reaction that required hospitalization: Unknown Has patient had a PCN reaction occurring within the last 10 years: No If all of the above answers are "NO",  then may proceed with Cephalosporin use.      Review of Systems  Constitutional: Positive for fatigue.  Eyes: Negative for visual disturbance.  Respiratory: Negative for cough, chest tightness and shortness of breath.   Cardiovascular: Negative for chest pain, palpitations and leg swelling.  Gastrointestinal: Negative for abdominal pain.  Neurological: Positive for weakness. Negative for dizziness, syncope, light-headedness and headaches.       Objective:   Physical Exam  Constitutional:  Alert thin somewhat cachectic appearing gentleman   Neck: Neck supple.  Cardiovascular: Normal rate and regular rhythm.   Pulmonary/Chest: Effort normal and breath sounds normal. No respiratory distress. He has no wheezes. He has no rales.  Musculoskeletal: He exhibits no edema.  Neurological: He is alert.       Assessment:     #1 hypertension stable. Repeat left arm seated after rest 138/80  #2 history of atrial fibrillation on chronic anticoagulation  #3 history of upper GI bleed and iron deficiency anemia  #4 history of depression. He has low initiative  #5 dyslipidemia    Plan:     -Check labs -lipid,  hepatic, basic metabolic panel, TIBC, ferritin, CBC -Consider trial of low-dose Wellbutrin XL 150 mgs once daily. Hopefully this can help with his initiative, depression -Consider Medicare wellness visit -Schedule follow-up here in one month to reassess  [name] MD [place] [facility]

## 2017-01-25 LAB — BASIC METABOLIC PANEL
BUN: 19 mg/dL (ref 7–25)
CALCIUM: 8.9 mg/dL (ref 8.6–10.3)
CO2: 23 mmol/L (ref 20–32)
Chloride: 104 mmol/L (ref 98–110)
Creat: 1.01 mg/dL (ref 0.70–1.11)
GLUCOSE: 98 mg/dL (ref 65–99)
POTASSIUM: 4 mmol/L (ref 3.5–5.3)
SODIUM: 140 mmol/L (ref 135–146)

## 2017-01-25 LAB — HEPATIC FUNCTION PANEL
ALBUMIN: 4.1 g/dL (ref 3.6–5.1)
ALT: 12 U/L (ref 9–46)
AST: 19 U/L (ref 10–35)
Alkaline Phosphatase: 52 U/L (ref 40–115)
Bilirubin, Direct: 0.2 mg/dL (ref <=0.2)
Indirect Bilirubin: 0.6 mg/dL (ref 0.2–1.2)
TOTAL PROTEIN: 6.6 g/dL (ref 6.1–8.1)
Total Bilirubin: 0.8 mg/dL (ref 0.2–1.2)

## 2017-01-25 LAB — LIPID PANEL
CHOLESTEROL: 110 mg/dL (ref <200)
HDL: 54 mg/dL (ref >40)
LDL Cholesterol: 41 mg/dL (ref <100)
Total CHOL/HDL Ratio: 2 Ratio (ref <5.0)
Triglycerides: 77 mg/dL (ref <150)
VLDL: 15 mg/dL (ref <30)

## 2017-01-25 LAB — FERRITIN: Ferritin: 53 ng/mL (ref 20–380)

## 2017-01-25 LAB — IRON AND TIBC
%SAT: 24 % (ref 15–60)
IRON: 82 ug/dL (ref 50–180)
TIBC: 340 ug/dL (ref 250–425)
UIBC: 258 ug/dL

## 2017-02-06 ENCOUNTER — Other Ambulatory Visit: Payer: Self-pay | Admitting: Family Medicine

## 2017-02-07 ENCOUNTER — Other Ambulatory Visit: Payer: Self-pay | Admitting: Family Medicine

## 2017-02-14 ENCOUNTER — Other Ambulatory Visit: Payer: Self-pay | Admitting: Family Medicine

## 2017-02-20 ENCOUNTER — Encounter: Payer: Self-pay | Admitting: Family Medicine

## 2017-02-21 ENCOUNTER — Encounter: Payer: Self-pay | Admitting: Family Medicine

## 2017-02-21 ENCOUNTER — Ambulatory Visit (INDEPENDENT_AMBULATORY_CARE_PROVIDER_SITE_OTHER): Payer: Medicare Other | Admitting: Family Medicine

## 2017-02-21 VITALS — BP 128/78 | HR 103 | Temp 97.9°F | Ht 72.0 in | Wt 148.3 lb

## 2017-02-21 DIAGNOSIS — F32A Depression, unspecified: Secondary | ICD-10-CM

## 2017-02-21 DIAGNOSIS — I482 Chronic atrial fibrillation, unspecified: Secondary | ICD-10-CM

## 2017-02-21 DIAGNOSIS — F329 Major depressive disorder, single episode, unspecified: Secondary | ICD-10-CM | POA: Diagnosis not present

## 2017-02-21 DIAGNOSIS — S61412A Laceration without foreign body of left hand, initial encounter: Secondary | ICD-10-CM

## 2017-02-21 DIAGNOSIS — S51811A Laceration without foreign body of right forearm, initial encounter: Secondary | ICD-10-CM

## 2017-02-21 NOTE — Patient Instructions (Signed)
Change dressing in about 2 days Follow up for any redness, pus, or warmth.

## 2017-02-21 NOTE — Progress Notes (Signed)
Subjective:     Patient ID: [name], male   DOB: 1932-09-15, 83 y.o.   MRN: [id number]  HPI Patient has complicated medical history. He was seen recently and seemed down and has had low initiative. We started Wellbutrin XL 150 mg daily and both patient and his wife think his had some improvement. More motivation and get up and go. He has been more active physically. We obtained several labs all looked very stable. Denies any side effects  Had fall Tuesday of this week and went to local urgent care on Wednesday. He had skin tears involving the left hand and right forearm. These were dressed. He's on Coumadin. Denies any bony pains. No lower extremity pain. No loss of conscious. He states that his feet got "tangled up ".  Past Medical History:  Diagnosis Date  . Anemia   . ASD (atrial septal defect)    a. s/p repair 1983.  [name] BPH (benign prostatic hyperplasia)   . Cerebrovascular accident ([place]) [facility]  . Chronic bronchitis ([place])    a. h/o resp failure in setting of bronchitis vs PNA 05/2012.  [name] Chronic diarrhea   . COPD (chronic obstructive pulmonary disease) ([place])   . Coronary artery disease    a. Nonobstructive by cath 2009 and 09/06/14  . Depression   . Dysphagia, unspecified(787.20)   . Erosive gastritis    H/o GIB felt secondary to this  . Esophagitis   . Gastric polyp   . Gastric ulcer   . GERD (gastroesophageal reflux disease)   . Hearing impairment   . Hiatal hernia   . History of upper gastrointestinal hemorrhage    dr. Maurene Capes, GI  . Hyperlipidemia   . Hypertension   . Internal hemorrhoids   . LV dysfunction    a. EF 45-50% by echo 05/2012.  [name] Permanent atrial fibrillation (HCC)    [facility] at [facility].  [name] Vision problems    Past Surgical History:  Procedure Laterality Date  . ASD REPAIR  1983  . CARDIOVERSION     possibly 3 times, dr. brody/cooper  . CHOLECYSTECTOMY  1990s  . ENTEROSCOPY  05/13/2012   Procedure: ENTEROSCOPY;  Surgeon: Inda Castle, MD;   Location: [facility] ENDOSCOPY;  Service: Endoscopy;  Laterality: N/A;  . [place]  . LEFT HEART CATHETERIZATION WITH CORONARY ANGIOGRAM N/A 09/06/2014   Procedure: LEFT HEART CATHETERIZATION WITH CORONARY ANGIOGRAM;  Surgeon: Jettie Booze, MD;  Location: [facility] CATH LAB;  Service: Cardiovascular;  Laterality: N/A;  . partial small bowel resection  2005   ischemic?    reports that he quit smoking about 33 years ago. His smoking use included Cigarettes. He smoked 0.00 packs per day for 0.00 years. He has never used smokeless tobacco. He reports that he does not drink alcohol or use drugs. family history includes Heart disease in his father and mother; Prostate cancer in his brother. Allergies  Allergen Reactions  . Lovenox [Enoxaparin Sodium] Swelling  . Tape Other (See Comments)    SKIN WILL TEAR IF TAPED (MUST BE LIGHT & BRIEF OR USE COBAN WRAP, PLEASE!!)  . Penicillins Hives    Has patient had a PCN reaction causing immediate rash, facial/tongue/throat swelling, SOB or lightheadedness with hypotension: Yes Has patient had a PCN reaction causing severe rash involving mucus membranes or skin necrosis: No Has patient had a PCN reaction that required hospitalization: Unknown Has patient had a PCN reaction occurring within the last 10 years: No If all  of the above answers are "NO", then may proceed with Cephalosporin use.      Review of Systems  Eyes: Negative for visual disturbance.  Respiratory: Negative for cough, chest tightness and shortness of breath.   Cardiovascular: Negative for chest pain, palpitations and leg swelling.  Neurological: Negative for dizziness, syncope, weakness, light-headedness and headaches.       Objective:   Physical Exam  Constitutional: He appears well-developed and well-nourished.  Cardiovascular: Normal rate.   Pulmonary/Chest: Effort normal and breath sounds normal. No respiratory distress. He has no wheezes. He has no rales.  Skin:   Patient has skin tears involving right lateral forearm and dorsum of the left hand. No signs of secondary infection.       Assessment:     #1 skin tears involving right forearm and left hand as above following fall. No secondary infection.  #2 history of recurrent depression improved with Wellbutrin.  #3 history of atrial fibrillation on Xarelto    Plan:     -Wounds were cleaned and redressed -Continue with Wellbutrin XL 150 mg once daily. -reviewed all of his medications. -Offered one more follow up to assess wounds next week and wife wishes to follow up home- unless complications.  Nurse reviewed how to change dressings  [name] MD [facility]  -

## 2017-04-15 ENCOUNTER — Other Ambulatory Visit: Payer: Self-pay | Admitting: Family Medicine

## 2017-04-18 ENCOUNTER — Other Ambulatory Visit: Payer: Self-pay | Admitting: Internal Medicine

## 2017-04-29 ENCOUNTER — Other Ambulatory Visit: Payer: Self-pay | Admitting: Family Medicine

## 2017-05-16 ENCOUNTER — Other Ambulatory Visit: Payer: Self-pay | Admitting: Internal Medicine

## 2017-06-02 ENCOUNTER — Other Ambulatory Visit: Payer: Self-pay | Admitting: Family Medicine

## 2017-06-02 ENCOUNTER — Other Ambulatory Visit: Payer: Self-pay | Admitting: Internal Medicine

## 2017-06-04 ENCOUNTER — Other Ambulatory Visit: Payer: Self-pay | Admitting: Internal Medicine

## 2017-06-17 ENCOUNTER — Other Ambulatory Visit: Payer: Self-pay | Admitting: Family Medicine

## 2017-07-04 ENCOUNTER — Other Ambulatory Visit: Payer: Self-pay | Admitting: Family Medicine

## 2017-07-04 ENCOUNTER — Other Ambulatory Visit: Payer: Self-pay | Admitting: *Deleted

## 2017-07-04 ENCOUNTER — Other Ambulatory Visit: Payer: Self-pay | Admitting: Internal Medicine

## 2017-07-07 ENCOUNTER — Other Ambulatory Visit: Payer: Self-pay | Admitting: Family Medicine

## 2017-07-17 ENCOUNTER — Other Ambulatory Visit: Payer: Self-pay | Admitting: Gastroenterology

## 2017-08-04 ENCOUNTER — Other Ambulatory Visit: Payer: Self-pay | Admitting: Family Medicine

## 2017-08-04 ENCOUNTER — Ambulatory Visit: Payer: Medicare Other | Admitting: Internal Medicine

## 2017-08-04 ENCOUNTER — Encounter: Payer: Self-pay | Admitting: Internal Medicine

## 2017-08-04 VITALS — BP 150/90 | HR 86 | Ht 72.0 in | Wt 153.0 lb

## 2017-08-04 DIAGNOSIS — I4821 Permanent atrial fibrillation: Secondary | ICD-10-CM

## 2017-08-04 DIAGNOSIS — J449 Chronic obstructive pulmonary disease, unspecified: Secondary | ICD-10-CM | POA: Diagnosis not present

## 2017-08-04 DIAGNOSIS — I10 Essential (primary) hypertension: Secondary | ICD-10-CM

## 2017-08-04 DIAGNOSIS — I482 Chronic atrial fibrillation: Secondary | ICD-10-CM | POA: Diagnosis not present

## 2017-08-04 MED ORDER — ISOSORBIDE MONONITRATE ER 30 MG PO TB24: 30.0000 mg | ORAL_TABLET | Freq: Every day | ORAL | 3 refills | Status: DC

## 2017-08-04 NOTE — Progress Notes (Signed)
PCP: [name], MD   Primary EP: Dr Rayann Heman  [name] is a 84 y.o. male who presents today for routine electrophysiology followup.  Since last being seen in our clinic, the patient reports doing reasonably well.  He has multiple chronic comorbidities and is not very active.  He has occasional chest pain.  This seems to occur when he takes his medicines on an empty stomach.  SOB is stable. Today, he denies symptoms of palpitations, exertional chest pain,  lower extremity edema, dizziness, presyncope, or syncope.  The patient is otherwise without complaint today.   Past Medical History:  Diagnosis Date  . Anemia   . ASD (atrial septal defect)    a. s/p repair 1983.  [name] BPH (benign prostatic hyperplasia)   . Cerebrovascular accident ([place]) [place]  . Chronic bronchitis ([place])    a. h/o resp failure in setting of bronchitis vs PNA 05/2012.  [name] Chronic diarrhea   . COPD (chronic obstructive pulmonary disease) ([place])   . Coronary artery disease    a. Nonobstructive by cath 2009 and 09/06/14  . Depression   . Dysphagia, unspecified(787.20)   . Erosive gastritis    H/o GIB felt secondary to this  . Esophagitis   . Gastric polyp   . Gastric ulcer   . GERD (gastroesophageal reflux disease)   . Hearing impairment   . Hiatal hernia   . History of upper gastrointestinal hemorrhage    dr. Maurene Capes, GI  . Hyperlipidemia   . Hypertension   . Internal hemorrhoids   . LV dysfunction    a. EF 45-50% by echo 05/2012.  [name] Permanent atrial fibrillation (HCC)    INR followed at [facility].  [name] Vision problems    Past Surgical History:  Procedure Laterality Date  . ASD REPAIR  1983  . CARDIOVERSION     possibly 3 times, dr. brody/cooper  . CHOLECYSTECTOMY  1990s  . ENTEROSCOPY  05/13/2012   Procedure: ENTEROSCOPY;  Surgeon: Inda Castle, MD;  Location: [facility] ENDOSCOPY;  Service: Endoscopy;  Laterality: N/A;  . [place]  . LEFT HEART CATHETERIZATION WITH CORONARY ANGIOGRAM  N/A 09/06/2014   Procedure: LEFT HEART CATHETERIZATION WITH CORONARY ANGIOGRAM;  Surgeon: Jettie Booze, MD;  Location: [facility] CATH LAB;  Service: Cardiovascular;  Laterality: N/A;  . partial small bowel resection  2005   ischemic?    ROS- all systems are reviewed and negatives except as per HPI above  Current Outpatient Medications  Medication Sig Dispense Refill  . buPROPion (WELLBUTRIN XL) 150 MG 24 hr tablet Take 1 tablet (150 mg total) by mouth daily. 30 tablet 6  . clotrimazole-betamethasone (LOTRISONE) cream APPLY TO AFFECTED AREAS TWICE A DAY 45 g 0  . dutasteride (AVODART) 0.5 MG capsule TAKE (1) CAPSULE DAILY 90 capsule 1  . furosemide (LASIX) 20 MG tablet Take 1 tablet (20 mg total) by mouth as needed. For edema 30 tablet 6  . ipratropium (ATROVENT) 0.02 % nebulizer solution NEBULIZE 1 VAIL 4 TIMES A DAY AS DIRECTED 312.5 mL 0  . isosorbide mononitrate (IMDUR) 30 MG 24 hr tablet Take 1 tablet (30 mg total) by mouth daily. Patient must keep 08/04/17 appointment for further refills 30 tablet 1  . levalbuterol (XOPENEX) 0.63 MG/3ML nebulizer solution NEBULIZE 1 VIAL (WITH IPRATROPIUM) TWICE A DAY AS NEEDED. MAY USE EXTR A EVERY 4 HOURS IF NEEDED 216 mL 0  . mirtazapine (REMERON) 15 MG tablet TAKE 1 TABLET DAILY. 30 tablet  5  . mupirocin ointment (BACTROBAN) 2 % Apply 1 application topically 2 (two) times daily.    . nitroGLYCERIN (NITROSTAT) 0.4 MG SL tablet Place 1 tablet (0.4 mg total) under the tongue every 5 (five) minutes x 3 doses as needed for chest pain. 25 tablet 3  . pantoprazole (PROTONIX) 40 MG tablet TAKE 1 TABLET DAILY 30 tablet 0  . potassium chloride (K-DUR) 10 MEQ tablet TAKE (2) TABLETS TWICE DAILY. 120 tablet 1  . rivastigmine (EXELON) 4.6 mg/24hr PLACE 1 PATCH ONTO SKIN DAILY AS DIRECTED 30 patch 2  . saccharomyces boulardii (FLORASTOR) 250 MG capsule Take 250 mg by mouth 2 (two) times daily.    . simvastatin (ZOCOR) 20 MG tablet TAKE ONE TABLET AT BEDTIME 90 tablet  1  . vitamin B-12 (CYANOCOBALAMIN) 500 MCG tablet Take 500 mcg by mouth daily.    Alveda Reasons 20 MG TABS tablet TAKE 1 TABLET DAILY WITH SUPPER 90 tablet 1   No current facility-administered medications for this visit.     Physical Exam: Vitals:   08/04/17 1557  BP: (!) 150/90  Pulse: 86  SpO2: 99%  Weight: 153 lb (69.4 kg)  Height: 6' (1.829 m)    GEN- The patient is elderly and frail appearing, alert and oriented x 3 today.   Head- normocephalic, atraumatic Eyes-  Sclera clear, conjunctiva pink Ears- hearing intact Oropharynx- clear Lungs- Clear to ausculation bilaterally, normal work of breathing Heart- irregular rate and rhythm, no murmurs, rubs or gallops, PMI not laterally displaced GI- soft, NT, ND, + BS Extremities- no clubbing, cyanosis, or edema  EKG tracing ordered today is personally reviewed and shows afib, V rate 88 bpm, RBBB, LPHB  Assessment and Plan:  1. Permanent afib Rate controlled Asymptomatic chads2vasc score is 5.  On xarelto Given bifascicular block, I would avoid aggressive AV nodal blockade  2. HTN I have offered amlodipine 2.5mg  daily for elevated BP and anginal relief.  He declines changes today.  3. COPD Followed by Dr Melvyn Novas  4. CAD Stable angina I have offered stress testing which he declines I have also offered amlodipine 2.5mg  daily for angina which he declines.  He has sl NTG which he takes rarely  Given advanced age and comorbidities, a conservative approach is advised. Return to see EP PA annually  Thompson Grayer MD, [facility] 08/04/2017 4:27 PM

## 2017-08-04 NOTE — Patient Instructions (Addendum)

## 2017-08-12 ENCOUNTER — Other Ambulatory Visit: Payer: Self-pay | Admitting: *Deleted

## 2017-08-12 MED ORDER — POTASSIUM CHLORIDE ER 10 MEQ PO TBCR: EXTENDED_RELEASE_TABLET | ORAL | 1 refills | Status: DC

## 2017-08-15 ENCOUNTER — Other Ambulatory Visit: Payer: Self-pay | Admitting: Family Medicine

## 2017-08-15 ENCOUNTER — Other Ambulatory Visit: Payer: Self-pay | Admitting: Gastroenterology

## 2017-08-15 NOTE — Telephone Encounter (Signed)
Refill for 6 months. 

## 2017-08-20 ENCOUNTER — Other Ambulatory Visit: Payer: Self-pay | Admitting: Family Medicine

## 2017-08-29 ENCOUNTER — Other Ambulatory Visit: Payer: Self-pay | Admitting: Family Medicine

## 2017-09-01 ENCOUNTER — Other Ambulatory Visit: Payer: Self-pay | Admitting: Family Medicine

## 2017-09-20 ENCOUNTER — Other Ambulatory Visit: Payer: Self-pay | Admitting: Gastroenterology

## 2017-09-29 ENCOUNTER — Other Ambulatory Visit: Payer: Self-pay

## 2017-09-29 ENCOUNTER — Emergency Department (HOSPITAL_COMMUNITY): Payer: Medicare Other

## 2017-09-29 ENCOUNTER — Observation Stay (HOSPITAL_COMMUNITY)
Admission: EM | Admit: 2017-09-29 | Discharge: 2017-10-02 | Disposition: A | Discharge status: Home / Self Care | Payer: Medicare Other | Attending: Family Medicine | Admitting: Family Medicine

## 2017-09-29 ENCOUNTER — Encounter (HOSPITAL_COMMUNITY): Payer: Self-pay

## 2017-09-29 DIAGNOSIS — F0390 Unspecified dementia without behavioral disturbance: Secondary | ICD-10-CM | POA: Diagnosis not present

## 2017-09-29 DIAGNOSIS — I482 Chronic atrial fibrillation, unspecified: Secondary | ICD-10-CM | POA: Diagnosis present

## 2017-09-29 DIAGNOSIS — I2510 Atherosclerotic heart disease of native coronary artery without angina pectoris: Secondary | ICD-10-CM

## 2017-09-29 DIAGNOSIS — F329 Major depressive disorder, single episode, unspecified: Secondary | ICD-10-CM | POA: Diagnosis not present

## 2017-09-29 DIAGNOSIS — Z7901 Long term (current) use of anticoagulants: Secondary | ICD-10-CM | POA: Insufficient documentation

## 2017-09-29 DIAGNOSIS — I2782 Chronic pulmonary embolism: Secondary | ICD-10-CM

## 2017-09-29 DIAGNOSIS — R627 Adult failure to thrive: Secondary | ICD-10-CM | POA: Diagnosis present

## 2017-09-29 DIAGNOSIS — I159 Secondary hypertension, unspecified: Secondary | ICD-10-CM

## 2017-09-29 DIAGNOSIS — I10 Essential (primary) hypertension: Secondary | ICD-10-CM | POA: Diagnosis present

## 2017-09-29 DIAGNOSIS — Z87891 Personal history of nicotine dependence: Secondary | ICD-10-CM | POA: Diagnosis not present

## 2017-09-29 DIAGNOSIS — J449 Chronic obstructive pulmonary disease, unspecified: Secondary | ICD-10-CM | POA: Diagnosis not present

## 2017-09-29 DIAGNOSIS — Z66 Do not resuscitate: Secondary | ICD-10-CM | POA: Insufficient documentation

## 2017-09-29 DIAGNOSIS — I130 Hypertensive heart and chronic kidney disease with heart failure and stage 1 through stage 4 chronic kidney disease, or unspecified chronic kidney disease: Secondary | ICD-10-CM | POA: Insufficient documentation

## 2017-09-29 DIAGNOSIS — Z79899 Other long term (current) drug therapy: Secondary | ICD-10-CM | POA: Insufficient documentation

## 2017-09-29 DIAGNOSIS — K219 Gastro-esophageal reflux disease without esophagitis: Secondary | ICD-10-CM | POA: Insufficient documentation

## 2017-09-29 DIAGNOSIS — N183 Chronic kidney disease, stage 3 (moderate): Secondary | ICD-10-CM | POA: Insufficient documentation

## 2017-09-29 DIAGNOSIS — E538 Deficiency of other specified B group vitamins: Secondary | ICD-10-CM | POA: Diagnosis not present

## 2017-09-29 DIAGNOSIS — Z86711 Personal history of pulmonary embolism: Secondary | ICD-10-CM | POA: Diagnosis not present

## 2017-09-29 DIAGNOSIS — R079 Chest pain, unspecified: Principal | ICD-10-CM | POA: Insufficient documentation

## 2017-09-29 DIAGNOSIS — R0600 Dyspnea, unspecified: Secondary | ICD-10-CM

## 2017-09-29 DIAGNOSIS — E785 Hyperlipidemia, unspecified: Secondary | ICD-10-CM | POA: Diagnosis not present

## 2017-09-29 DIAGNOSIS — Z8774 Personal history of (corrected) congenital malformations of heart and circulatory system: Secondary | ICD-10-CM | POA: Insufficient documentation

## 2017-09-29 DIAGNOSIS — Z8673 Personal history of transient ischemic attack (TIA), and cerebral infarction without residual deficits: Secondary | ICD-10-CM | POA: Diagnosis not present

## 2017-09-29 DIAGNOSIS — F32A Depression, unspecified: Secondary | ICD-10-CM | POA: Diagnosis present

## 2017-09-29 DIAGNOSIS — N400 Enlarged prostate without lower urinary tract symptoms: Secondary | ICD-10-CM | POA: Diagnosis not present

## 2017-09-29 DIAGNOSIS — I2699 Other pulmonary embolism without acute cor pulmonale: Secondary | ICD-10-CM | POA: Diagnosis present

## 2017-09-29 DIAGNOSIS — I5032 Chronic diastolic (congestive) heart failure: Secondary | ICD-10-CM | POA: Diagnosis not present

## 2017-09-29 DIAGNOSIS — Z515 Encounter for palliative care: Secondary | ICD-10-CM

## 2017-09-29 DIAGNOSIS — R0609 Other forms of dyspnea: Secondary | ICD-10-CM

## 2017-09-29 DIAGNOSIS — Z7189 Other specified counseling: Secondary | ICD-10-CM

## 2017-09-29 LAB — BASIC METABOLIC PANEL
Anion gap: 9 (ref 5–15)
BUN: 13 mg/dL (ref 6–20)
CO2: 29 mmol/L (ref 22–32)
Calcium: 9.8 mg/dL (ref 8.9–10.3)
Chloride: 100 mmol/L — ABNORMAL LOW (ref 101–111)
Creatinine, Ser: 1.06 mg/dL (ref 0.61–1.24)
GFR calc Af Amer: >60 mL/min (ref >60)
GFR calc non Af Amer: >60 mL/min (ref >60)
Glucose, Bld: 102 mg/dL — ABNORMAL HIGH (ref 65–99)
Potassium: 4.1 mmol/L (ref 3.5–5.1)
Sodium: 138 mmol/L (ref 135–145)

## 2017-09-29 LAB — URINALYSIS, ROUTINE W REFLEX MICROSCOPIC
Bilirubin Urine: NEGATIVE
Glucose, UA: NEGATIVE mg/dL
Ketones, ur: NEGATIVE mg/dL
Leukocytes, UA: NEGATIVE
Nitrite: NEGATIVE
Protein, ur: NEGATIVE mg/dL
Specific Gravity, Urine: 1.006 (ref 1.005–1.030)
pH: 5 (ref 5.0–8.0)

## 2017-09-29 LAB — CBC
HCT: 50.7 % (ref 39.0–52.0)
Hemoglobin: 16.8 g/dL (ref 13.0–17.0)
MCH: 30.4 pg (ref 26.0–34.0)
MCHC: 33.1 g/dL (ref 30.0–36.0)
MCV: 91.8 fL (ref 78.0–100.0)
Platelets: 200 10*3/uL (ref 150–400)
RBC: 5.52 MIL/uL (ref 4.22–5.81)
RDW: 13.2 % (ref 11.5–15.5)
WBC: 5.5 10*3/uL (ref 4.0–10.5)

## 2017-09-29 LAB — I-STAT TROPONIN, ED: Troponin i, poc: 0.02 ng/mL (ref 0.00–0.08)

## 2017-09-29 MED ORDER — RIVAROXABAN 20 MG PO TABS
20.0000 mg | ORAL_TABLET | Freq: Every day | ORAL | Status: DC
  Administered 2017-09-30 – 2017-10-01 (×3): 20 mg via ORAL
  Filled 2017-09-29 (×5): qty 1

## 2017-09-29 MED ORDER — NITROGLYCERIN 0.4 MG SL SUBL: 0.4000 mg | SUBLINGUAL_TABLET | SUBLINGUAL | Status: DC | PRN

## 2017-09-29 MED ORDER — HYDRALAZINE HCL 20 MG/ML IJ SOLN
5.0000 mg | INTRAMUSCULAR | Status: DC | PRN
  Administered 2017-09-30 (×2): 5 mg via INTRAVENOUS
  Filled 2017-09-29 (×2): qty 1

## 2017-09-29 MED ORDER — BUPROPION HCL ER (XL) 150 MG PO TB24
150.0000 mg | ORAL_TABLET | Freq: Every day | ORAL | Status: DC
  Administered 2017-10-01 – 2017-10-02 (×2): 150 mg via ORAL
  Filled 2017-09-29 (×2): qty 1

## 2017-09-29 MED ORDER — SODIUM CHLORIDE 0.9 % IV BOLUS
500.0000 mL | Freq: Once | INTRAVENOUS | Status: AC
Start: 2017-09-29 — End: 2017-09-29
  Administered 2017-09-29: 500 mL via INTRAVENOUS

## 2017-09-29 MED ORDER — CLOTRIMAZOLE 1 % EX CREA
TOPICAL_CREAM | Freq: Two times a day (BID) | CUTANEOUS | Status: DC
  Administered 2017-10-01 – 2017-10-02 (×3): via TOPICAL
  Filled 2017-09-29 (×2): qty 15

## 2017-09-29 MED ORDER — DUTASTERIDE 0.5 MG PO CAPS
0.5000 mg | ORAL_CAPSULE | Freq: Every day | ORAL | Status: DC
  Administered 2017-10-01 – 2017-10-02 (×2): 0.5 mg via ORAL
  Filled 2017-09-29 (×3): qty 1

## 2017-09-29 MED ORDER — ONDANSETRON HCL 4 MG/2ML IJ SOLN: 4.0000 mg | Freq: Four times a day (QID) | INTRAMUSCULAR | Status: DC | PRN

## 2017-09-29 MED ORDER — RIVASTIGMINE 4.6 MG/24HR TD PT24
4.6000 mg | MEDICATED_PATCH | Freq: Every day | TRANSDERMAL | Status: DC
  Administered 2017-09-30 – 2017-10-02 (×3): 4.6 mg via TRANSDERMAL
  Filled 2017-09-29 (×4): qty 1

## 2017-09-29 MED ORDER — ISOSORBIDE MONONITRATE ER 30 MG PO TB24
30.0000 mg | ORAL_TABLET | Freq: Every day | ORAL | Status: DC
  Administered 2017-10-01 – 2017-10-02 (×2): 30 mg via ORAL
  Filled 2017-09-29 (×2): qty 1

## 2017-09-29 MED ORDER — PANTOPRAZOLE SODIUM 40 MG PO TBEC
40.0000 mg | DELAYED_RELEASE_TABLET | Freq: Every day | ORAL | Status: DC
  Administered 2017-10-01 – 2017-10-02 (×2): 40 mg via ORAL
  Filled 2017-09-29 (×2): qty 1

## 2017-09-29 MED ORDER — ZOLPIDEM TARTRATE 5 MG PO TABS: 5.0000 mg | ORAL_TABLET | Freq: Every evening | ORAL | Status: DC | PRN

## 2017-09-29 MED ORDER — MUPIROCIN 2 % EX OINT
1.0000 "application " | TOPICAL_OINTMENT | Freq: Two times a day (BID) | CUTANEOUS | Status: DC
  Administered 2017-09-30 – 2017-10-02 (×4): 1 via TOPICAL
  Filled 2017-09-29 (×4): qty 22

## 2017-09-29 MED ORDER — SIMVASTATIN 20 MG PO TABS
20.0000 mg | ORAL_TABLET | Freq: Every day | ORAL | Status: DC
  Administered 2017-09-30 – 2017-10-01 (×3): 20 mg via ORAL
  Filled 2017-09-29 (×5): qty 1

## 2017-09-29 MED ORDER — SACCHAROMYCES BOULARDII 250 MG PO CAPS
250.0000 mg | ORAL_CAPSULE | Freq: Two times a day (BID) | ORAL | Status: DC
  Administered 2017-09-30 – 2017-10-02 (×5): 250 mg via ORAL
  Filled 2017-09-29 (×6): qty 1

## 2017-09-29 MED ORDER — MIRTAZAPINE 15 MG PO TABS
15.0000 mg | ORAL_TABLET | Freq: Every evening | ORAL | Status: DC
  Administered 2017-09-30 – 2017-10-01 (×2): 15 mg via ORAL
  Filled 2017-09-29 (×3): qty 1

## 2017-09-29 MED ORDER — ACETAMINOPHEN 325 MG PO TABS: 650.0000 mg | ORAL_TABLET | ORAL | Status: DC | PRN

## 2017-09-29 MED ORDER — VITAMIN B-12 1000 MCG PO TABS
500.0000 ug | ORAL_TABLET | Freq: Every day | ORAL | Status: DC
  Administered 2017-10-01 – 2017-10-02 (×2): 500 ug via ORAL
  Filled 2017-09-29 (×2): qty 1

## 2017-09-29 MED ORDER — IPRATROPIUM BROMIDE 0.02 % IN SOLN
0.2500 mg | Freq: Four times a day (QID) | RESPIRATORY_TRACT | Status: DC
  Administered 2017-09-30 (×3): 0.25 mg via RESPIRATORY_TRACT
  Filled 2017-09-29 (×3): qty 2.5

## 2017-09-29 NOTE — ED Triage Notes (Signed)
Pt presents with decrease appetite and drinking over past few weeks. Family reports decreased energy and weakness.

## 2017-09-29 NOTE — H&P (Addendum)
History and Physical    [name]:[id number] DOB: 10-14-1932 DOA: 09/29/2017  Referring MD/NP/PA:   PCP: [name], MD   Patient coming from:  The patient is coming from home.  At baseline, pt is partially dependent for most of ADL.   Chief Complaint: chest pain, and failure to thrive  HPI: [name] is a 84 y.o. male with medical history significant of Hypertension,hyperlipidemia, COPD, GERD, ASD (s/p of repair 1983), BPH, CAD, gastric ulcer, atrial fibrillation on Xarelto, dCHF, PE, who presents with chest pain and failure to thrive.  Per his wife, pt has been having chest pain for a long time, which has been located in left side of chest, intermittent,moderate, nonradiating. It is associated with some mild shortness of breath. It has been slightly worsening in the past several weeks. Patient does not have for cough, fever or chills. Patient has generalized weakness, decreased oral intake and poor appetite recently. No nausea, vomiting, diarrhea or abdominal pain. No symptoms of UTI. His health condition has been declining per his wife. Patient does not have unilateral weakness, numbness or tingling in his extremities. No facial droop or slurred speech.  ED Course: pt was found to have WBC 5.5, electrolytes renal function okay, negative troponin, negative urinalysis, temperature normal, no tachycardia, has tachypnea, oxygen saturation 96% on room air. Chest x-ray has no infiltration, and showed possible hiatal hernia  And enlarged central pulmonary arteries suspect for pulmonary hypertension. Pt is place on telemetry bed for observation.  Review of Systems:   General: no fevers, chills, no body weight gain, has poor appetite, has fatigue HEENT: no blurry vision, hearing changes or sore throat Respiratory: has dyspnea, no coughing, wheezing CV: has chest pain, no palpitations GI: no nausea, vomiting, abdominal pain, diarrhea, constipation GU: no dysuria, burning on  urination, increased urinary frequency, hematuria  Ext: no leg edema Neuro: no unilateral weakness, numbness, or tingling, no vision change or hearing loss Skin: no rash, no skin tear. MSK: No muscle spasm, no deformity, no limitation of range of movement in spin Heme: No easy bruising.  Travel history: No recent long distant travel.  Allergy:  Allergies  Allergen Reactions  . Lovenox [Enoxaparin Sodium] Swelling  . Tape Other (See Comments)    SKIN WILL TEAR IF TAPED (MUST BE LIGHT & BRIEF OR USE COBAN WRAP, PLEASE!!)  . Penicillins Hives    Has patient had a PCN reaction causing immediate rash, facial/tongue/throat swelling, SOB or lightheadedness with hypotension: Yes Has patient had a PCN reaction causing severe rash involving mucus membranes or skin necrosis: No Has patient had a PCN reaction that required hospitalization: Unknown Has patient had a PCN reaction occurring within the last 10 years: No If all of the above answers are "NO", then may proceed with Cephalosporin use.     Past Medical History:  Diagnosis Date  . Anemia   . ASD (atrial septal defect)    a. s/p repair 1983.  [name] BPH (benign prostatic hyperplasia)   . Cerebrovascular accident ([place]) [place]  . Chronic bronchitis ([place])    a. h/o resp failure in setting of bronchitis vs PNA 05/2012.  [name] Chronic diarrhea   . COPD (chronic obstructive pulmonary disease) ([place])   . Coronary artery disease    a. Nonobstructive by cath 2009 and 09/06/14  . Depression   . Dysphagia, unspecified(787.20)   . Erosive gastritis    H/o GIB felt secondary to this  . Esophagitis   . Gastric polyp   .  Gastric ulcer   . GERD (gastroesophageal reflux disease)   . Hearing impairment   . Hiatal hernia   . History of upper gastrointestinal hemorrhage    dr. Maurene Capes, GI  . Hyperlipidemia   . Hypertension   . Internal hemorrhoids   . LV dysfunction    a. EF 45-50% by echo 05/2012.  [name] Permanent atrial fibrillation (HCC)    INR followed  at [facility].  [name] Vision problems     Past Surgical History:  Procedure Laterality Date  . ASD REPAIR  1983  . CARDIOVERSION     possibly 3 times, dr. brody/cooper  . CHOLECYSTECTOMY  1990s  . ENTEROSCOPY  05/13/2012   Procedure: ENTEROSCOPY;  Surgeon: Inda Castle, MD;  Location: [facility] ENDOSCOPY;  Service: Endoscopy;  Laterality: N/A;  . [place]  . LEFT HEART CATHETERIZATION WITH CORONARY ANGIOGRAM N/A 09/06/2014   Procedure: LEFT HEART CATHETERIZATION WITH CORONARY ANGIOGRAM;  Surgeon: Jettie Booze, MD;  Location: [facility] CATH LAB;  Service: Cardiovascular;  Laterality: N/A;  . partial small bowel resection  2005   ischemic?    Social History:  reports that he quit smoking about 34 years ago. His smoking use included cigarettes. He smoked 0.00 packs per day for 0.00 years. He has never used smokeless tobacco. He reports that he does not drink alcohol or use drugs.  Family History:  Family History  Problem Relation Age of Onset  . Heart disease Mother   . Heart disease Father   . Prostate cancer Brother      Prior to Admission medications   Medication Sig Start Date End Date Taking? Authorizing Provider  buPROPion (WELLBUTRIN XL) 150 MG 24 hr tablet Take 1 tablet (150 mg total) by mouth daily. 09/01/17  Yes Burchette, Alinda Sierras, MD  clotrimazole-betamethasone (LOTRISONE) cream APPLY TO AFFECTED AREAS TWICE A DAY Patient taking differently: APPLY TO AFFECTED AREAS as needed 06/26/16  Yes Burchette, Alinda Sierras, MD  dutasteride (AVODART) 0.5 MG capsule TAKE (1) CAPSULE DAILY 06/02/17  Yes Burchette, Alinda Sierras, MD  furosemide (LASIX) 20 MG tablet Take 1 tablet (20 mg total) by mouth as needed. For edema 04/22/16  Yes Meng, [place], PA  ipratropium (ATROVENT) 0.02 % nebulizer solution NEBULIZE 1 VAIL 4 TIMES A DAY AS DIRECTED Patient taking differently: NEBULIZE 1 VAIL  A DAY AS needed 07/07/17  Yes Burchette, Alinda Sierras, MD  isosorbide mononitrate (IMDUR) 30 MG 24 hr tablet Take 1  tablet (30 mg total) by mouth daily. 08/04/17  Yes Allred, Jeneen Rinks, MD  levalbuterol (XOPENEX) 0.63 MG/3ML nebulizer solution NEBULIZE 1 VIAL (WITH IPRATROPIUM) TWICE A DAY AS NEEDED. MAY USE EXTR A EVERY 4 HOURS IF NEEDED 07/04/17  Yes Burchette, Alinda Sierras, MD  mirtazapine (REMERON) 15 MG tablet TAKE 1 TABLET DAILY Patient taking differently: TAKE 1 TABLET in the evening 08/15/17  Yes Burchette, Alinda Sierras, MD  mupirocin ointment (BACTROBAN) 2 % Apply 1 application topically 2 (two) times daily.   Yes [provider]  nitroGLYCERIN (NITROSTAT) 0.4 MG SL tablet Place 1 tablet (0.4 mg total) under the tongue every 5 (five) minutes x 3 doses as needed for chest pain. 08/20/14  Yes Theora Gianotti, NP  pantoprazole (PROTONIX) 40 MG tablet TAKE 1 TABLET DAILY 09/22/17  Yes Nandigam, Venia Minks, MD  potassium chloride (K-DUR) 10 MEQ tablet TAKE (2) TABLETS TWICE DAILY. 08/12/17  Yes Burchette, Alinda Sierras, MD  rivastigmine (EXELON) 4.6 mg/24hr PLACE 1 PATCH ONTO SKIN DAILY AS DIRECTED  08/20/17  Yes Burchette, Alinda Sierras, MD  saccharomyces boulardii (FLORASTOR) 250 MG capsule Take 250 mg by mouth 2 (two) times daily.   Yes [provider]  simvastatin (ZOCOR) 20 MG tablet TAKE ONE TABLET AT BEDTIME 04/15/17  Yes Burchette, Alinda Sierras, MD  vitamin B-12 (CYANOCOBALAMIN) 500 MCG tablet Take 500 mcg by mouth daily.   Yes [provider]  XARELTO 20 MG TABS tablet TAKE 1 TABLET DAILY WITH SUPPER 08/29/17  Yes Burchette, Alinda Sierras, MD  potassium chloride (K-DUR) 10 MEQ tablet TAKE (2) TABLETS TWICE DAILY. Patient not taking: Reported on 09/29/2017 02/14/17   [name], MD    Physical Exam: Vitals:   09/30/17 0045 09/30/17 0046 09/30/17 0130 09/30/17 0215  BP: (!) 178/106 (!) 175/106 (!) 188/100 (!) 181/108  Pulse: 86  (!) 102 94  Resp: 19  (!) 25 (!) 26  Temp:      TempSrc:      SpO2: 96%  100% 98%  Weight:      Height:       General: Not in acute distress HEENT:       Eyes: PERRL,  EOMI, no scleral icterus.       ENT: No discharge from the ears and nose, no pharynx injection, no tonsillar enlargement.        Neck: No JVD, no bruit, no mass felt. Heme: No neck lymph node enlargement. Cardiac: S1/S2, RRR, No murmurs, No gallops or rubs. Respiratory: No rales, wheezing, rhonchi or rubs. GI: Soft, nondistended, nontender, no rebound pain, no organomegaly, BS present. GU: No hematuria Ext: No pitting leg edema bilaterally. 2+DP/PT pulse bilaterally. Musculoskeletal: No joint deformities, No joint redness or warmth, no limitation of ROM in spin. Skin: No rashes.  Neuro: Alert, oriented X3, cranial nerves II-XII grossly intact, moves all extremities normally. Psych: Patient is not psychotic, no suicidal or hemocidal ideation.  Labs on Admission: I have personally reviewed following labs and imaging studies  CBC: Recent Labs  Lab 09/29/17 1426  WBC 5.5  HGB 16.8  HCT 50.7  MCV 91.8  PLT 629   Basic Metabolic Panel: Recent Labs  Lab 09/29/17 1426  NA 138  K 4.1  CL 100*  CO2 29  GLUCOSE 102*  BUN 13  CREATININE 1.06  CALCIUM 9.8   GFR: Estimated Creatinine Clearance: 54.2 mL/min (by C-G formula based on SCr of 1.06 mg/dL). Liver Function Tests: No results for input(s): AST, ALT, ALKPHOS, BILITOT, PROT, ALBUMIN in the last 168 hours. No results for input(s): LIPASE, AMYLASE in the last 168 hours. No results for input(s): AMMONIA in the last 168 hours. Coagulation Profile: No results for input(s): INR, PROTIME in the last 168 hours. Cardiac Enzymes: Recent Labs  Lab 09/30/17 0012  TROPONINI 0.04*   BNP (last 3 results) No results for input(s): PROBNP in the last 8760 hours. HbA1C: No results for input(s): HGBA1C in the last 72 hours. CBG: No results for input(s): GLUCAP in the last 168 hours. Lipid Profile: No results for input(s): CHOL, HDL, LDLCALC, TRIG, CHOLHDL, LDLDIRECT in the last 72 hours. Thyroid Function Tests: No results for  input(s): TSH, T4TOTAL, FREET4, T3FREE, THYROIDAB in the last 72 hours. Anemia Panel: No results for input(s): VITAMINB12, FOLATE, FERRITIN, TIBC, IRON, RETICCTPCT in the last 72 hours. Urine analysis:    Component Value Date/Time   COLORURINE YELLOW 09/29/2017 1427   APPEARANCEUR CLEAR 09/29/2017 1427   LABSPEC 1.006 09/29/2017 1427   PHURINE 5.0 09/29/2017 1427   GLUCOSEU  NEGATIVE 09/29/2017 1427   HGBUR SMALL (A) 09/29/2017 1427   BILIRUBINUR NEGATIVE 09/29/2017 1427   BILIRUBINUR 1+ 05/11/2015 1420   KETONESUR NEGATIVE 09/29/2017 1427   PROTEINUR NEGATIVE 09/29/2017 1427   UROBILINOGEN 0.2 05/11/2015 1420   UROBILINOGEN 0.2 11/16/2012 1237   NITRITE NEGATIVE 09/29/2017 1427   LEUKOCYTESUR NEGATIVE 09/29/2017 1427   Sepsis Labs: @LABRCNTIP (procalcitonin:4,lacticidven:4) )No results found for this or any previous visit (from the past 240 hour(s)).   Radiological Exams on Admission: Dg Chest 2 View  Result Date: 09/29/2017 CLINICAL DATA:  Decreased appetite, weakness EXAM: CHEST - 2 VIEW COMPARISON:  01/11/2016, CT 01/11/2016 FINDINGS: Post sternotomy changes. Large hiatal hernia contributing to retrocardiac opacity. No definite effusion. Mild cardiomegaly. Stable enlarged pulmonary arteries bilaterally. No pneumothorax. IMPRESSION: 1. Hyperinflation. Enlarged central pulmonary arteries suspect for pulmonary hypertension 2. Chronic retrocardiac opacity, felt related to large hiatal hernia. Electronically Signed   By: Donavan Foil M.D.   On: 09/29/2017 22:11     EKG: Independently reviewed.  Sinus rhythm, QTC 424, RAD, poor R-wave progression, anteroseptal infarction pattern.  Assessment/Plan Principal Problem:   Chest pain Active Problems:   Coronary atherosclerosis   HTN (hypertension)   GERD (gastroesophageal reflux disease)   Chronic diastolic (congestive) heart failure (HCC)   Depression   Failure to thrive in adult   Chronic atrial fibrillation (HCC)   Pulmonary  emboli (HCC)   Dementia  Chest pain and hx of CAD: patient has atypical chest pain, which has been going on intermittently for long time. Less likely to have ACS. Patient is taking blood thinner Xaerlto, and no signs of DVT, less likely to have PE. - will place on Tele bed for obs - cycle CE q6 x3 and repeat EKG in the am  - prn Nitroglycerin, Morphine, imdur and zocor - Risk factor stratification: will check FLP and A1C  - 2d echo - card consult was requested via Epic  Atrial Fibrillation: CHA2DS2-VASc Score is 5, needs oral anticoagulation. Patient is on Xarelto at home. INR is  on admission. Heart rate is well controlled. Not on CCB or BB -continue Xarelto -tele monitoring  HTN: bp is 168/97 -start amlodipine at 2.5 mg daily -IV hydralazine prn  GERD: -Protonix  Chronic diastolic (congestive) heart failure ([place]): 2-D echo on 01/12/16 showed EF of 55-60%. Patient does not have leg edema JVD. CHF is compensated. -Continue Lasix when necessary  Depression: Stable, no suicidal or homicidal ideations. -Continue home medications: Wellbutrin, Remeron  HLD: -zocor  Pulmonary emboli ([place]): -on Xarelto  Failure to thrive in adult: -pt/ot -nutrition consult -consult CM and SW for possible placement  Dementia: no behavioral disturbance -[place]    DVT ppx: on Xarelto Code Status: Full code Family Communication: Yes, patient's wife at bed side Disposition Plan:  Anticipate discharge back to previous home environment Consults called:  none Admission status: Obs / tele        Date of Service 09/30/2017    Ivor Costa [facility] Pager [phone]  If 7PM-7AM, please contact night-coverage [email or web address] Password [facility] 09/30/2017, 2:47 AM

## 2017-09-29 NOTE — ED Provider Notes (Signed)
Patient placed in Quick Look pathway, seen and evaluated   Chief Complaint: Failure to thrive  HPI:   Poor PO intake for the past few months.  No fevers.  Family reports condition is gradually deteriorating.    ROS: No fevers (one)  Physical Exam:   Gen: No distress  Neuro: Awake and Alert  Skin: Warm    Focused Exam: Frail, elderly male, no obvious distress.     Initiation of care has begun. The patient has been counseled on the process, plan, and necessity for staying for the completion/evaluation, and the remainder of the medical screening examination    Ollen Gross 09/29/17 1430    Lacretia Leigh, MD 09/30/17 1314

## 2017-09-29 NOTE — ED Provider Notes (Signed)
[facility] EMERGENCY DEPARTMENT Provider Note   CSN: [id number] Arrival date & time: 09/29/17  1309     History   Chief Complaint Chief Complaint  Patient presents with  . Failure To Thrive    HPI [name] is a 84 y.o. male with complicated past medical history including CVA, COPD, CHF, CAD, hypertension, hyperlipidemia, GERD presents for evaluation of progressively worsening exertional chest pain.  He states that for the past month he has been experiencing left-sided chest pain on exertion.  He states it improves with rest.  He also notes shortness of breath which occurs with activity.  He notes generalized fatigue but denies recent trauma falls or syncope.  He denies fevers, chills, abdominal pain, nausea, vomiting, or diaphoresis.  He has not tried anything for his symptoms but has used nitroglycerin in the past for similar pain with improvement.  His wife is concerned additionally because he has not been eating or drinking nearly as much as he has in the past.  She states that he will drink a cup of coffee in the mornings and may eat a small breakfast and will only have a few bites of lunch or dinner.  Patient thinks he may have lost some weight over the past few months but is unable to quantify how much.  His cardiologist is Dr. Rayann Heman and he is on Xarelto daily.  States he has been compliant with his home medicines.  He is a former smoker.  The history is provided by the patient and the spouse.    Past Medical History:  Diagnosis Date  . Anemia   . ASD (atrial septal defect)    a. s/p repair 1983.  [name] BPH (benign prostatic hyperplasia)   . Cerebrovascular accident ([place]) [place]  . Chronic bronchitis ([place])    a. h/o resp failure in setting of bronchitis vs PNA 05/2012.  [name] Chronic diarrhea   . COPD (chronic obstructive pulmonary disease) ([place])   . Coronary artery disease    a. Nonobstructive by cath 2009 and 09/06/14  . Depression   . Dysphagia,  unspecified(787.20)   . Erosive gastritis    H/o GIB felt secondary to this  . Esophagitis   . Gastric polyp   . Gastric ulcer   . GERD (gastroesophageal reflux disease)   . Hearing impairment   . Hiatal hernia   . History of upper gastrointestinal hemorrhage    dr. Maurene Capes, GI  . Hyperlipidemia   . Hypertension   . Internal hemorrhoids   . LV dysfunction    a. EF 45-50% by echo 05/2012.  [name] Permanent atrial fibrillation (HCC)    [facility]R [facility].  [name] Vision problems     Patient Active Problem List   Diagnosis Date Noted  . Chest pain 09/29/2017  . Elevated PSA 02/02/2016  . Pulmonary emboli ([place]) 01/11/2016  . Actinic keratoses 11/16/2015  . Memory loss 11/16/2015  . Atypical chest pain 01/28/2015  . Right carotid bruit 09/26/2014  . Abnormal stress test   . Chronic atrial fibrillation ([place]) 09/04/2014  . On warfarin therapy 09/04/2014  . Encounter for therapeutic drug monitoring 07/26/2013  . Bradycardia 02/17/2013  . Acute on chronic systolic CHF (congestive heart failure) ([place]) 11/16/2012  . Acute on chronic renal failure ([place]) 11/16/2012  . Failure to thrive in adult 10/30/2012  . Anemia of chronic disease 10/13/2012  . Toxic encephalopathy secondary to UTI 10/12/2012  . Depression 10/12/2012  . Moderate protein-calorie malnutrition ([place]) 10/12/2012  .  Stage III chronic kidney disease ([place]) 10/12/2012  . CHF (congestive heart failure) ([place]) 09/06/2012  . Chronic diastolic (congestive) heart failure ([place]) 07/23/2012  . Hypokalemia 07/20/2012  . Hypomagnesemia 07/20/2012  . Hypocalcemia 07/20/2012  . Acute kidney injury ([place]) 07/20/2012  . Chronic diarrhea 07/20/2012  . Falls frequently 07/20/2012  . Leukocytosis 07/20/2012  . Normocytic anemia 07/20/2012  . Chronic respiratory failure ([place]) 05/23/2012  . COPD exacerbation ([place]) 05/22/2012  . Gastritis with hemorrhage 05/13/2012  . Stricture and stenosis of esophagus 05/13/2012  . Acute posthemorrhagic  anemia 05/12/2012  . Atrial fibrillation with rapid ventricular response ([place]) 05/06/2012  . Dysphagia 05/06/2012  . HTN (hypertension) 05/06/2012  . H/O: CVA (cerebrovascular accident) 05/06/2012  . GERD (gastroesophageal reflux disease) 05/06/2012  . Purulent bronchitis ([place]) 05/06/2012  . SOB (shortness of breath) 03/19/2010  . ABDOMINAL DISTENSION 03/19/2010  . FATIGUE 11/09/2009  . B12 DEFICIENCY 07/26/2009  . Hyperglycemia 07/26/2009  . Hyperlipidemia 07/20/2009  . Hereditary and idiopathic peripheral neuropathy 07/20/2009  . BENIGN PROSTATIC HYPERTROPHY, HX OF 07/20/2009  . Essential hypertension 09/05/2007  . Coronary atherosclerosis 09/05/2007  . FIBRILLATION, ATRIAL 09/05/2007  . Cerebral artery occlusion with cerebral infarction ([place]) 09/05/2007  . ESOPHAGITIS 09/05/2007  . GERD 09/05/2007  . EROSIVE GASTRITIS 09/05/2007  . UPPER GASTROINTESTINAL HEMORRHAGE 09/05/2007  . DYSPHAGIA UNSPECIFIED 09/05/2007  . INTERNAL HEMORRHOIDS 06/10/2003  . GASTRIC POLYP 12/03/2001  . HIATAL HERNIA 12/03/2001  . COLONIC POLYPS 09/25/1992  . GASTRIC ULCER 09/22/1992    Past Surgical History:  Procedure Laterality Date  . ASD REPAIR  1983  . CARDIOVERSION     possibly 3 times, dr. brody/cooper  . CHOLECYSTECTOMY  1990s  . ENTEROSCOPY  05/13/2012   Procedure: ENTEROSCOPY;  Surgeon: Inda Castle, MD;  Location: [facility] ENDOSCOPY;  Service: Endoscopy;  Laterality: N/A;  . [place]  . LEFT HEART CATHETERIZATION WITH CORONARY ANGIOGRAM N/A 09/06/2014   Procedure: LEFT HEART CATHETERIZATION WITH CORONARY ANGIOGRAM;  Surgeon: Jettie Booze, MD;  Location: [facility] C[facility] LAB;  Service: Cardiovascular;  Laterality: N/A;  . partial small bowel resection  2005   ischemic?        Home Medications    Prior to Admission medications   Medication Sig Start Date End Date Taking? Authorizing Provider  buPROPion (WELLBUTRIN XL) 150 MG 24 hr tablet Take 1 tablet (150 mg total)  by mouth daily. 09/01/17  Yes Burchette, Alinda Sierras, MD  clotrimazole-betamethasone (LOTRISONE) cream APPLY TO AFFECTED AREAS TWICE A DAY Patient taking differently: APPLY TO AFFECTED AREAS as needed 06/26/16  Yes Burchette, Alinda Sierras, MD  dutasteride (AVODART) 0.5 MG capsule TAKE (1) CAPSULE DAILY 06/02/17  Yes Burchette, Alinda Sierras, MD  furosemide (LASIX) 20 MG tablet Take 1 tablet (20 mg total) by mouth as needed. For edema 04/22/16  Yes Meng, Minerva, PA  ipratropium (ATROVENT) 0.02 % nebulizer solution NEBULIZE 1 VAIL 4 TIMES A DAY AS DIRECTED Patient taking differently: NEBULIZE 1 VAIL  A DAY AS needed 07/07/17  Yes Burchette, Alinda Sierras, MD  isosorbide mononitrate (IMDUR) 30 MG 24 hr tablet Take 1 tablet (30 mg total) by mouth daily. 08/04/17  Yes Allred, Jeneen Rinks, MD  levalbuterol (XOPENEX) 0.63 MG/3ML nebulizer solution NEBULIZE 1 VIAL (WITH IPRATROPIUM) TWICE A DAY AS NEEDED. MAY USE EXTR A EVERY 4 HOURS IF NEEDED 07/04/17  Yes Burchette, Alinda Sierras, MD  mirtazapine (REMERON) 15 MG tablet TAKE 1 TABLET DAILY Patient taking differently: TAKE 1 TABLET in the evening 08/15/17  Yes  Burchette, Alinda Sierras, MD  mupirocin ointment (BACTROBAN) 2 % Apply 1 application topically 2 (two) times daily.   Yes [provider]  nitroGLYCERIN (NITROSTAT) 0.4 MG SL tablet Place 1 tablet (0.4 mg total) under the tongue every 5 (five) minutes x 3 doses as needed for chest pain. 08/20/14  Yes Theora Gianotti, NP  pantoprazole (PROTONIX) 40 MG tablet TAKE 1 TABLET DAILY 09/22/17  Yes Nandigam, Venia Minks, MD  potassium chloride (K-DUR) 10 MEQ tablet TAKE (2) TABLETS TWICE DAILY. 08/12/17  Yes Burchette, Alinda Sierras, MD  rivastigmine (EXELON) 4.6 mg/24hr PLACE 1 PATCH ONTO SKIN DAILY AS DIRECTED 08/20/17  Yes Burchette, Alinda Sierras, MD  saccharomyces boulardii (FLORASTOR) 250 MG capsule Take 250 mg by mouth 2 (two) times daily.   Yes [provider]  simvastatin (ZOCOR) 20 MG tablet TAKE ONE TABLET AT BEDTIME 04/15/17  Yes  Burchette, Alinda Sierras, MD  vitamin B-12 (CYANOCOBALAMIN) 500 MCG tablet Take 500 mcg by mouth daily.   Yes [provider]  XARELTO 20 MG TABS tablet TAKE 1 TABLET DAILY WITH SUPPER 08/29/17  Yes Burchette, Alinda Sierras, MD  potassium chloride (K-DUR) 10 MEQ tablet TAKE (2) TABLETS TWICE DAILY. Patient not taking: Reported on 09/29/2017 02/14/17   [name], MD    Family History Family History  Problem Relation Age of Onset  . Heart disease Mother   . Heart disease Father   . Prostate cancer Brother     Social History Social History   Tobacco Use  . Smoking status: Former Smoker    Packs/day: 0.00    Years: 0.00    Pack years: 0.00    Types: Cigarettes    Last attempt to quit: 06/13/1983    Years since quitting: 34.3  . Smokeless tobacco: Never Used  Substance Use Topics  . Alcohol use: No  . Drug use: No     Allergies   Lovenox [enoxaparin sodium]; Tape; and Penicillins   Review of Systems Review of Systems  Constitutional: Positive for activity change, appetite change and fatigue. Negative for chills, diaphoresis and fever.  Respiratory: Positive for shortness of breath. Negative for cough.   Cardiovascular: Positive for chest pain. Negative for leg swelling.  Gastrointestinal: Negative for abdominal pain, anal bleeding, diarrhea, nausea and vomiting.  Neurological: Negative for syncope, light-headedness and headaches.  All other systems reviewed and are negative.    Physical Exam Updated Vital Signs BP (!) 157/100   Pulse 87   Temp 97.6 F (36.4 C)   Resp (!) 22   Ht 5\' 11"  (1.803 m)   Wt 72.6 kg (160 lb)   SpO2 97%   BMI 22.32 kg/m   Physical Exam  Constitutional: No distress.  Resting in bed, quite thin and chronically ill-appearing  HENT:  Head: Normocephalic and atraumatic.  Eyes: Pupils are equal, round, and reactive to light. Conjunctivae and EOM are normal. Right eye exhibits no discharge. Left eye exhibits no discharge.  Neck: Normal  range of motion. Neck supple. No JVD present. No tracheal deviation present.  Cardiovascular: Intact distal pulses.  Irregularly irregular rhythm, 2+ radial and DP/PT pulses bl, negative Homan's bl, no LE edema  Pulmonary/Chest: Effort normal.  Globally diminished breath sounds, mildly tachypneic.  Speaking in short but full sentences.  Abdominal: Soft. Bowel sounds are normal. He exhibits no distension. There is no tenderness. There is no guarding.  Musculoskeletal: He exhibits no edema.  Neurological: He is alert.  Skin: Skin is warm and dry.  No erythema.  Psychiatric: He has a normal mood and affect. His behavior is normal.  Nursing note and vitals reviewed.    ED Treatments / Results  Labs (all labs ordered are listed, but only abnormal results are displayed) Labs Reviewed  BASIC METABOLIC PANEL - Abnormal; Notable for the following components:      Result Value   Chloride 100 (*)    Glucose, Bld 102 (*)    All other components within normal limits  URINALYSIS, ROUTINE W REFLEX MICROSCOPIC - Abnormal; Notable for the following components:   Hgb urine dipstick SMALL (*)    Bacteria, UA RARE (*)    All other components within normal limits  CBC  BRAIN NATRIURETIC PEPTIDE  HEMOGLOBIN A1C  LIPID PANEL  TROPONIN I  TROPONIN I  TROPONIN I  TSH  I-STAT TROPONIN, ED    EKG EKG Interpretation  Date/Time:  Monday September 29 2017 14:24:23 EDT Ventricular Rate:  95 PR Interval:    QRS Duration: 120 QT Interval:  338 QTC Calculation: 424 R Axis:   107 Text Interpretation:  Atrial fibrillation Right bundle branch block Abnormal ECG No significant change since last tracing Confirmed by Virgel Manifold [phone]) on 09/29/2017 11:29:40 PM   Radiology Dg Chest 2 View  Result Date: 09/29/2017 CLINICAL DATA:  Decreased appetite, weakness EXAM: CHEST - 2 VIEW COMPARISON:  01/11/2016, CT 01/11/2016 FINDINGS: Post sternotomy changes. Large hiatal hernia contributing to retrocardiac  opacity. No definite effusion. Mild cardiomegaly. Stable enlarged pulmonary arteries bilaterally. No pneumothorax. IMPRESSION: 1. Hyperinflation. Enlarged central pulmonary arteries suspect for pulmonary hypertension 2. Chronic retrocardiac opacity, felt related to large hiatal hernia. Electronically Signed   By: Donavan Foil M.D.   On: 09/29/2017 22:11    Procedures Procedures (including critical care time)  Medications Ordered in ED Medications  buPROPion (WELLBUTRIN XL) 24 hr tablet 150 mg (has no administration in time range)  clotrimazole (LOTRIMIN) 1 % cream (has no administration in time range)  dutasteride (AVODART) capsule 0.5 mg (has no administration in time range)  ipratropium (ATROVENT) nebulizer solution 0.25 mg (has no administration in time range)  isosorbide mononitrate (IMDUR) 24 hr tablet 30 mg (has no administration in time range)  mirtazapine (REMERON) tablet 15 mg (has no administration in time range)  mupirocin ointment (BACTROBAN) 2 % 1 application (has no administration in time range)  nitroGLYCERIN (NITROSTAT) SL tablet 0.4 mg (has no administration in time range)  pantoprazole (PROTONIX) EC tablet 40 mg (has no administration in time range)  rivastigmine (EXELON) 4.6 mg/24hr 4.6 mg (has no administration in time range)  saccharomyces boulardii (FLORASTOR) capsule 250 mg (has no administration in time range)  simvastatin (ZOCOR) tablet 20 mg (has no administration in time range)  vitamin B-12 (CYANOCOBALAMIN) tablet 500 mcg (has no administration in time range)  rivaroxaban (XARELTO) tablet 20 mg (has no administration in time range)  acetaminophen (TYLENOL) tablet 650 mg (has no administration in time range)  ondansetron (ZOFRAN) injection 4 mg (has no administration in time range)  zolpidem (AMBIEN) tablet 5 mg (has no administration in time range)  hydrALAZINE (APRESOLINE) injection 5 mg (has no administration in time range)  sodium chloride 0.9 % bolus 500 mL  (0 mLs Intravenous Stopped 09/29/17 2251)     Initial Impression / Assessment and Plan / ED Course  I have reviewed the triage vital signs and the nursing notes.  Pertinent labs & imaging results that were available during my care of the patient were reviewed by me  and considered in my medical decision making (see chart for details).     Patient presents with several months of decreased appetite and fatigue and a few weeks of exertional chest pain and shortness of breath.  He is afebrile, hypertensive and tachypneic while in the ED.  This improved somewhat with a small fluid bolus.  He is frail in appearance.  EKG shows no significant changes from last tracing and initial troponin is normal.  Chest x-ray shows hyperinflation and enlarged central pulmonary arteries suspicious for pulmonary hypertension.  Lab work reviewed by me shows no leukocytosis, no anemia, no significant electrolyte abnormalities.  UA is not concerning for UTI or nephrolithiasis.  Given patient's extensive cardiac history and complaint of exertional chest pain and dyspnea on exertion he would benefit from overnight observation and chest pain rule out.  Spoke with Dr. Blaine Hamper who agrees to assume care of patient and bring him into the hospital for further evaluation and management.  Final Clinical Impressions(s) / ED Diagnoses   Final diagnoses:  Exertional chest pain  DOE (dyspnea on exertion)    ED Discharge Orders    None       Debroah Baller 09/30/17 Baxter Hire, MD 09/30/17 1539

## 2017-09-30 DIAGNOSIS — F0390 Unspecified dementia without behavioral disturbance: Secondary | ICD-10-CM | POA: Diagnosis not present

## 2017-09-30 DIAGNOSIS — R627 Adult failure to thrive: Secondary | ICD-10-CM

## 2017-09-30 DIAGNOSIS — I5032 Chronic diastolic (congestive) heart failure: Secondary | ICD-10-CM | POA: Diagnosis not present

## 2017-09-30 DIAGNOSIS — R072 Precordial pain: Secondary | ICD-10-CM

## 2017-09-30 LAB — TSH: TSH: 1.206 u[IU]/mL (ref 0.350–4.500)

## 2017-09-30 LAB — TROPONIN I: TROPONIN I: 0.04 ng/mL — AB (ref <0.03)

## 2017-09-30 LAB — BRAIN NATRIURETIC PEPTIDE: B NATRIURETIC PEPTIDE 5: 70.9 pg/mL (ref 0.0–100.0)

## 2017-09-30 MED ORDER — MORPHINE SULFATE (PF) 4 MG/ML IV SOLN: 0.5000 mg | INTRAVENOUS | Status: DC | PRN

## 2017-09-30 MED ORDER — AMLODIPINE BESYLATE 5 MG PO TABS
5.0000 mg | ORAL_TABLET | Freq: Every day | ORAL | Status: DC
  Administered 2017-10-01 – 2017-10-02 (×2): 5 mg via ORAL
  Filled 2017-09-30 (×2): qty 1

## 2017-09-30 MED ORDER — LORAZEPAM 2 MG/ML IJ SOLN
INTRAMUSCULAR | Status: AC
  Administered 2017-09-30: 0.5 mg via INTRAVENOUS
  Filled 2017-09-30: qty 1

## 2017-09-30 MED ORDER — HYDRALAZINE HCL 20 MG/ML IJ SOLN
10.0000 mg | INTRAMUSCULAR | Status: DC | PRN
  Administered 2017-09-30: 10 mg via INTRAVENOUS
  Filled 2017-09-30: qty 1

## 2017-09-30 MED ORDER — AMLODIPINE BESYLATE 5 MG PO TABS
2.5000 mg | ORAL_TABLET | Freq: Every day | ORAL | Status: DC
  Administered 2017-09-30: 2.5 mg via ORAL
  Filled 2017-09-30: qty 1

## 2017-09-30 MED ORDER — LORAZEPAM 2 MG/ML IJ SOLN
0.5000 mg | Freq: Three times a day (TID) | INTRAMUSCULAR | Status: DC | PRN
  Administered 2017-09-30 (×3): 0.5 mg via INTRAVENOUS
  Filled 2017-09-30: qty 1

## 2017-09-30 NOTE — ED Notes (Signed)
Sitter called pt trying to get out of bed

## 2017-09-30 NOTE — ED Notes (Signed)
Febo from 4E states there is a situation and I would have to call back and she already spoke to [facility], I can call to confirm it. Spoke to [place], [facility] who said RN will take report within the next 30 minutes or call him back

## 2017-09-30 NOTE — ED Notes (Signed)
Followed up with [facility] after not hearing back stated to call 4E and give report

## 2017-09-30 NOTE — ED Notes (Signed)
Joslyn Devon from palliative care transporting patient to [phone] Tele sitter going with, called tele sitter to advise pt transferring to [id number] new RN will be Ivin Booty

## 2017-09-30 NOTE — ED Notes (Signed)
Attempted to get pt's blood. Unable to get. Informed Clark - RN.

## 2017-09-30 NOTE — ED Notes (Signed)
Sitter called pt trying to get out of bed again

## 2017-09-30 NOTE — Care Management Note (Signed)
Case Management Note  Patient Details  Name: [name] MRN: [id number] Date of Birth: 1932-09-10  Subjective/Objective:                  84 y.o. male with PMHx: HTN,hyperlipidemia, COPD, GERD, ASD,, BPH, CAD, gastric ulcer, AFIB, dCHF, PE, who presents with chest pain and failure to thrive. From home with spouse.  Action/Plan: Admit status OBSERVATION (CP); anticipate discharge [place].   Expected Discharge Date:  (UNKNOWN)               Expected Discharge Plan:  [facility]  In-House Referral:  Clinical Social Work, Hospice / Palliative Care  Discharge planning Services  CM Consult  Status of Service:  In process, will continue to follow  If discussed at Long Length of Stay Meetings, dates discussed:    Additional Comments: PCP:  Carolann Littler, MD Fuller Mandril, RN 09/30/2017, 8:40 AM

## 2017-09-30 NOTE — ED Notes (Signed)
Spoke with [facility] after 4E stated staffing issues and couldn't take report

## 2017-09-30 NOTE — ED Notes (Signed)
Pt sitting on side of the bed, tele sitter did not call

## 2017-09-30 NOTE — Consult Note (Addendum)
Cardiology Consult    Patient ID: Darrell Darrell MRN: [id number], DOB/AGE: [birth year]   Admit date: 09/29/2017 Date of Consult: 09/30/2017  Primary Physician: [name], MD Primary Cardiologist: Dr. Rayann Heman Requesting Provider: Dr. Eliseo Squires Reason for Consultation: Chest pain  Darrell Darrell is a 84 y.o. male who is being seen today for the evaluation of chest pain at the request of Dr. Rayann Heman.   Patient Profile    84 yo male with PMH of permanent Afib, HTN, HL, CVA, GERD, and nonobstructive CAD who presented with chest pain.   Past Medical History   Past Medical History:  Diagnosis Date  . Anemia   . ASD (atrial septal defect)    a. s/p repair 1983.  Darrell Darrell BPH (benign prostatic hyperplasia)   . Cerebrovascular accident (Darrell Darrell) [place]  . Chronic bronchitis ([place])    a. h/o resp failure in setting of bronchitis vs PNA 05/2012.  Darrell Darrell Chronic diarrhea   . COPD (chronic obstructive pulmonary disease) ([place])   . Coronary artery disease    a. Nonobstructive by cath 2009 and 09/06/14  . Depression   . Dysphagia, unspecified(787.20)   . Erosive gastritis    H/o GIB felt secondary to this  . Esophagitis   . Gastric polyp   . Gastric ulcer   . GERD (gastroesophageal reflux disease)   . Hearing impairment   . Hiatal hernia   . History of upper gastrointestinal hemorrhage    dr. Maurene Capes, GI  . Hyperlipidemia   . Hypertension   . Internal hemorrhoids   . LV dysfunction    a. EF 45-50% by echo 05/2012.  Darrell Darrell Permanent atrial fibrillation (HCC)    INR followed at [facility].  Darrell Darrell Vision problems     Past Surgical History:  Procedure Laterality Date  . ASD REPAIR  1983  . CARDIOVERSION     possibly 3 times, dr. brody/cooper  . CHOLECYSTECTOMY  1990s  . ENTEROSCOPY  05/13/2012   Procedure: ENTEROSCOPY;  Surgeon: Inda Castle, MD;  Location: [facility] ENDOSCOPY;  Service: Endoscopy;  Laterality: N/A;  . [place]  . LEFT HEART CATHETERIZATION WITH CORONARY ANGIOGRAM N/A 09/06/2014    Procedure: LEFT HEART CATHETERIZATION WITH CORONARY ANGIOGRAM;  Surgeon: Jettie Booze, MD;  Location: [facility] CATH LAB;  Service: Cardiovascular;  Laterality: N/A;  . partial small bowel resection  2005   ischemic?     Allergies  Allergies  Allergen Reactions  . Lovenox [Enoxaparin Sodium] Swelling  . Tape Other (See Comments)    SKIN WILL TEAR IF TAPED (MUST BE LIGHT & BRIEF OR USE COBAN WRAP, PLEASE!!)  . Penicillins Hives    Has patient had a PCN reaction causing immediate rash, facial/tongue/throat swelling, SOB or lightheadedness with hypotension: Yes Has patient had a PCN reaction causing severe rash involving mucus membranes or skin necrosis: No Has patient had a PCN reaction that required hospitalization: Unknown Has patient had a PCN reaction occurring within the last 10 years: No If all of the above answers are "NO", then may proceed with Cephalosporin use.     History of Present Illness    Darrell Darrell is an 84 yo male with PMH of  permanent Afib, HTN, HL, CVA, GERD and nonobstructive CAD. He is followed by Dr. Rayann Heman for his permanent Afib. He is on Xarelto for [facility], with no AV nodal blockade given bifascicular block. Last cath 4/16 with mild diffuse coronary disease. He was last seen in the office on 08/04/17  with Dr. Rayann Heman and reported occasional chest pain. The option of stress testing was discussed with the patient but he declined at the time. Blood pressure was noted to be elevated and suggested adding low dose amlodipine but patient declined any changes to medications.   Patient was brought to the ED. History obtained from chart as the patient is confused. Notes indicate he was brought in for chest pain with some shortness of breath over the past couple of weeks. He is confused at the time of exam, unable to tell me the year, surroundings or why he was brought in. In the ED his labs showed stable electrolytes, Trop 0.02>>0.04, BNP 70.9, Hgb 16.8. EKG showed Afib rate 95  with known RBBB. CXR neg. He was admitted for further work up.   In talking with the patient he is able to tell me his name, DOB but does not remember why he is here, stating he wants to go home. No complaints at the time of exam. He is confused and trying to get out of bed.   Inpatient Medications    . amLODipine  2.5 mg Oral Daily  . buPROPion  150 mg Oral Daily  . clotrimazole   Topical BID  . dutasteride  0.5 mg Oral Daily  . ipratropium  0.25 mg Nebulization Q6H  . isosorbide mononitrate  30 mg Oral Daily  . mirtazapine  15 mg Oral QPM  . mupirocin ointment  1 application Topical BID  . pantoprazole  40 mg Oral Daily  . rivaroxaban  20 mg Oral Q supper  . rivastigmine  4.6 mg Transdermal Daily  . saccharomyces boulardii  250 mg Oral BID  . simvastatin  20 mg Oral QHS  . vitamin B-12  500 mcg Oral Daily    Family History    Family History  Problem Relation Age of Onset  . Heart disease Mother   . Heart disease Father   . Prostate cancer Brother     Social History    Social History   Socioeconomic History  . Marital status: Married    Spouse name: Not on file  . Number of children: Not on file  . Years of education: Not on file  . Highest education level: Not on file  Occupational History  . Not on file  Social Needs  . Financial resource strain: Not on file  . Food insecurity:    Worry: Not on file    Inability: Not on file  . Transportation needs:    Medical: Not on file    Non-medical: Not on file  Tobacco Use  . Smoking status: Former Smoker    Packs/day: 0.00    Years: 0.00    Pack years: 0.00    Types: Cigarettes    Last attempt to quit: 06/13/1983    Years since quitting: 34.3  . Smokeless tobacco: Never Used  Substance and Sexual Activity  . Alcohol use: No  . Drug use: No  . Sexual activity: Not on file  Lifestyle  . Physical activity:    Days per week: Not on file    Minutes per session: Not on file  . Stress: Not on file  Relationships   . Social connections:    Talks on phone: Not on file    Gets together: Not on file    Attends religious service: Not on file    Active member of club or organization: Not on file    Attends meetings of clubs or organizations:  Not on file    Relationship status: Not on file  . Intimate partner violence:    Fear of current or ex partner: Not on file    Emotionally abused: Not on file    Physically abused: Not on file    Forced sexual activity: Not on file  Other Topics Concern  . Not on file  Social History Narrative   Lives in [place] with wife.  Several falls recently, 4 in the last month.        Jedd Schulenburg  [phone]                 Review of Systems    See HPI  All other systems reviewed and are otherwise negative except as noted above.  Physical Exam    Blood pressure (!) 158/96, pulse (!) 114, temperature 97.6 F (36.4 C), resp. rate (!) 21, height 5\' 11"  (1.803 m), weight 160 lb (72.6 kg), SpO2 99 %.  General: Pleasant, but confused older WM NAD Psych: Normal affect. Neuro: Alert and oriented to self. Moves all extremities spontaneously. HEENT: Normal  Neck: Supple without bruits or JVD. Lungs:  Resp regular and unlabored, CTA. Heart: Irreg Irreg no s3, s4, soft systolic murmurs. Abdomen: Soft, non-tender, non-distended, BS + x 4.  Extremities: No clubbing, cyanosis or edema. DP/PT/Radials 2+ and equal bilaterally.  Labs    Troponin [facility] of Care Test) Recent Labs    09/29/17 2237  TROPIPOC 0.02   Recent Labs    09/30/17 0012  TROPONINI 0.04*   Lab Results  Component Value Date   WBC 5.5 09/29/2017   HGB 16.8 09/29/2017   HCT 50.7 09/29/2017   MCV 91.8 09/29/2017   PLT 200 09/29/2017    Recent Labs  Lab 09/29/17 1426  NA 138  K 4.1  CL 100*  CO2 29  BUN 13  CREATININE 1.06  CALCIUM 9.8  GLUCOSE 102*   Lab Results  Component Value Date   CHOL 110 01/24/2017   HDL 54 01/24/2017   LDLCALC 41 01/24/2017   TRIG 77 01/24/2017     No results found for: [facility]   Radiology Studies    Dg Chest 2 View  Result Date: 09/29/2017 CLINICAL DATA:  Decreased appetite, weakness EXAM: CHEST - 2 VIEW COMPARISON:  01/11/2016, CT 01/11/2016 FINDINGS: Post sternotomy changes. Large hiatal hernia contributing to retrocardiac opacity. No definite effusion. Mild cardiomegaly. Stable enlarged pulmonary arteries bilaterally. No pneumothorax. IMPRESSION: 1. Hyperinflation. Enlarged central pulmonary arteries suspect for pulmonary hypertension 2. Chronic retrocardiac opacity, felt related to large hiatal hernia. Electronically Signed   By: Donavan Foil M.D.   On: 09/29/2017 22:11    ECG & Cardiac Imaging    EKG:  The EKG was personally reviewed and demonstrates AFIB with RBBB  Echo: 01/12/16  Study Conclusions  - Left ventricle: The cavity size was normal. Wall thickness was   increased in a pattern of mild LVH. Systolic function was normal.   The estimated ejection fraction was in the range of 55% to 60%.   Wall motion was normal; there were no regional wall motion   abnormalities. Doppler parameters are consistent with high   ventricular filling pressure. - Mitral valve: Calcified annulus. There was mild regurgitation. - Left atrium: The atrium was severely dilated. - Right ventricle: The cavity size was moderately dilated. Systolic   function was moderately reduced. - Right atrium: The atrium was severely dilated. - Pulmonary arteries: Systolic pressure was moderately increased.   PA  peak pressure: 52 mm Hg (S).  Impressions:  - Normal LV systolic function; mild LVH; elevated LV filling   pressure; severe biatrial enlargement; moderate RVE with reduced   function; mild MR; mild TR with moderately elevated pulmonary   pressure.  Assessment & Plan    84 yo male with PMH of permanent Afib, HTN, HL, CVA, GERD, and nonobstructive CAD who presented with chest pain.   1. Chest pain: unable to obtain hx from patient as he  is confused. Denies any symptoms at the time of exam. EKG shows Afib with RBBB. Trop 0.02>>0.04. Last cath was back in 4/16 with minimal CAD. Given his advanced age, possible dementia and flat troponin trend would not consider him a candidate for further invasive work up at this time. Echo pending per primary  2. Permanent Afib: Rate is controlled. Has been on Xarelto for [facility]. Denies any falls at home, and says he uses a cane. May need to consider whether maintaining him on [place] in the future is beneficial.   3. HTN: elevated, but given his age would allow trend to be higher to reduce risk of falls, especially given he is on Xarelto.   4. GERD: on PPI  5. Chronic diastolic HF: No signs of volume overload on exam  Signed, Reino Bellis, NP-C Pager [phone] 09/30/2017, 12:47 PM   I have personally seen and examined this patient. I agree with the assessment and plan as outlined above.  Mr. Laday is a pleasant 84 yo man with permanent atrial fib, prior CVA and known non-obstructive CAD by cath in 2009 and 2016 admitted with chest pain. His chest pain was reported by his wife. He denies having any pain. He has no dyspnea. Troponin is very mildly abnormal at 0.04. EKG without ischemic changes. He tells me today that he feels great. He has had confusion. Nursing is now calling for a sitter as he is trying to get out of bed.  My exam:   General: Thin, pleasant elderly male in NAD HEENT: OP clear, mucus membranes moist  SKIN: warm, dry. No rashes. Neuro: No focal deficits  Musculoskeletal: Muscle strength 5/5 all ext  Psychiatric: Mood and affect normal  Neck: No JVD, no carotid bruits, no thyromegaly, no lymphadenopathy.  Lungs:Clear bilaterally, no wheezes, rhonci, crackles Cardiovascular: Irreg irreg.  Abdomen:Soft. Bowel sounds present. Non-tender.  Extremities: No lower extremity edema. Pulses are 2 + in the bilateral DP/PT.  Plan: Chest pain: Chest pain was reported in this confused,  elderly male. He is known to have mild CAD by cath in 2016. His troponin shows subtle elevation at 0.04 but this is likely not clinically significant. No ischemic changes on EKG. He denies chest pain now. I would not recommend further workup at this time. Continue current medications.  We will sign off. He can keep his planned follow up with Dr. Rayann Heman.   Lauree Chandler 09/30/2017 1:56 PM

## 2017-09-30 NOTE — ED Notes (Signed)
Report attempted 

## 2017-09-30 NOTE — ED Notes (Signed)
Pt ambulated to bathroom x1 assist.  

## 2017-09-30 NOTE — Evaluation (Signed)
Physical Therapy Evaluation Patient Details Name: [name] MRN: [id number] DOB: 12/29/1932 Today's Date: 09/30/2017   History of Present Illness  Pt is an 84 y.o. male admitted 09/29/17 with c/o atypical L-side chest pain and failure to thrive. CXR has no infiltration; showed possible hiatal hernia; enlarged pulmonary arteries suspect for pulmonary hypertension. PMH includes dementia, HTN, COPD, BPH, CAD, a-fib, CHF, PE.     Clinical Impression  Pt presents with an overall decrease in functional mobility secondary to above. Pt poor historian with baseline cognitive deficits; reports he lives with wife, but unable to provide consistent details regarding PLOF. Today, pt required bilat HHA and maxA to maintain balance while taking side steps at EOB. Pt with generalized weakness and very poor balance reactions. At significant increased risk for falls. Unsure how pt has been managing at home in current condition. Recommend SNF-level therapies at d/c to maximize functional mobility and independence. May benefit from a long-term memory care-type unit. Will follow acutely to address established goals.    Follow Up Recommendations SNF;Supervision/Assistance - 24 hour(potential long-term ALF)    Equipment Recommendations  (TBD next venue)    Recommendations for Other Services       Precautions / Restrictions Precautions Precautions: Fall Restrictions Weight Bearing Restrictions: No      Mobility  Bed Mobility Overal bed mobility: Needs Assistance Bed Mobility: Supine to Sit;Sit to Supine     Supine to sit: Min guard Sit to supine: Min guard   General bed mobility comments: Increased time and effort; min guard for safety  Transfers Overall transfer level: Needs assistance Equipment used: 2 person hand held assist Transfers: Sit to/from Stand Sit to Stand: Mod assist         General transfer comment: Pt reliant on bilat HHA and modA to assist trunk elevation/maintain balance  while standing. MinA to assist eccentric control into sitting  Ambulation/Gait Ambulation/Gait assistance: Max assist Ambulation Distance (Feet): 3 Feet Assistive device: 2 person hand held assist Gait Pattern/deviations: Step-to pattern;Trunk flexed;Leaning posteriorly Gait velocity: Decreased   General Gait Details: Able to take side steps towards [facility] with bilat HHA and maxA to prevent LOB; pt with significant posterior lean with inability to self-correct. Demonstrates zero postural reactions  Stairs            Wheelchair Mobility    Modified Rankin (Stroke Patients Only)       Balance Overall balance assessment: Needs assistance Sitting-balance support: No upper extremity supported;Feet supported Sitting balance-Leahy Scale: Fair Sitting balance - Comments: Dependent to don socks sitting EOB   Standing balance support: Bilateral upper extremity supported Standing balance-Leahy Scale: Zero Standing balance comment: Unable to maintain standing balance without maxA and bilat HHA; unable to self-correct despite max multimodal cues                             Pertinent Vitals/Pain Pain Assessment: No/denies pain    Home Living Family/patient expects to be discharged to:: Private residence Living Arrangements: Spouse/significant other Available Help at Discharge: Family;Available PRN/intermittently Type of Home: House Home Access: Stairs to enter Entrance Stairs-Rails: None Entrance Stairs-Number of Steps: 4 Home Layout: One level   Additional Comments: Pt unreliable historian    Prior Function Level of Independence: Needs assistance   Gait / Transfers Assistance Needed: Pt poor historian. He is unsure if he uses cane, RW, or neither at home. States he sits on side of tub to take baths  ADL's / Homemaking Assistance Needed: Pt unsure if wife helps him or not. Pt likely requires assist for all ADLs and IADLs        Hand Dominance         Extremity/Trunk Assessment   Upper Extremity Assessment Upper Extremity Assessment: Generalized weakness    Lower Extremity Assessment Lower Extremity Assessment: Generalized weakness    Cervical / Trunk Assessment Cervical / Trunk Assessment: Kyphotic  Communication   Communication: HOH  Cognition Arousal/Alertness: Awake/alert Behavior During Therapy: WFL for tasks assessed/performed Overall Cognitive Status: History of cognitive impairments - at baseline Area of Impairment: Orientation;Attention;Memory;Following commands;Safety/judgement;Awareness;Problem solving                 Orientation Level: Disoriented to;Time;Situation Current Attention Level: Sustained Memory: Decreased short-term memory Following Commands: Follows one step commands with increased time Safety/Judgement: Decreased awareness of safety;Decreased awareness of deficits Awareness: Intellectual Problem Solving: Slow processing;Difficulty sequencing;Requires verbal cues        General Comments General comments (skin integrity, edema, etc.): SpO2 98% on RA, HR 97. Supine BP 170/92, seated BP 192/101, post-mobility BP 168/88; pt asymptomatic     Exercises     Assessment/Plan    PT Assessment Patient needs continued PT services  PT Problem List Decreased strength;Decreased activity tolerance;Decreased balance;Decreased mobility;Decreased cognition;Decreased knowledge of use of DME;Decreased safety awareness       PT Treatment Interventions DME instruction;Gait training;Stair training;Functional mobility training;Therapeutic activities;Therapeutic exercise;Balance training;Patient/family education;Neuromuscular re-education    PT Goals (Current goals can be found in the Care Plan section)  Acute Rehab PT Goals Patient Stated Goal: Feel better PT Goal Formulation: With patient Time For Goal Achievement: 10/14/17 Potential to Achieve Goals: Fair    Frequency Min 2X/week   Barriers to  discharge Decreased caregiver support Unsure support pt has available    Co-evaluation               AM-PAC PT "6 Clicks" Daily Activity  Outcome Measure Difficulty turning over in bed (including adjusting bedclothes, sheets and blankets)?: A Little Difficulty moving from lying on back to sitting on the side of the bed? : A Little Difficulty sitting down on and standing up from a chair with arms (e.g., wheelchair, bedside commode, etc,.)?: Unable Help needed moving to and from a bed to chair (including a wheelchair)?: A Lot Help needed walking in hospital room?: A Lot Help needed climbing 3-5 steps with a railing? : Total 6 Click Score: 12    End of Session Equipment Utilized During Treatment: Gait belt Activity Tolerance: Patient tolerated treatment well;Patient limited by fatigue Patient left: in bed;with call bell/phone within reach;with bed alarm set Nurse Communication: Mobility status PT Visit Diagnosis: Other abnormalities of gait and mobility (R26.89);Repeated falls (R29.6)    Time: 8676-7209 PT Time Calculation (min) (ACUTE ONLY): 24 min   Charges:   PT Evaluation $PT Eval Moderate Complexity: 1 Mod PT Treatments $Therapeutic Activity: 8-22 mins   PT G Codes:       Mabeline Caras, PT, DPT Acute Rehab Services  Pager: [place] 09/30/2017, 9:38 AM

## 2017-09-30 NOTE — Progress Notes (Signed)
PROGRESS NOTE    [name]  ZOX:[id number] DOB: [birth year] DOA: 09/29/2017 PCP: [name], MD   Outpatient Specialists:     Brief Narrative:  [name] is a 84 y.o. male with medical history significant of Hypertension,hyperlipidemia, COPD, GERD, ASD (s/p of repair 1983), BPH, CAD, gastric ulcer, atrial fibrillation on Xarelto, dCHF, PE, who presents with chest pain and failure to thrive.  Per his wife, pt has been having chest pain for a long time, which has been located in left side of chest, intermittent,moderate, nonradiating. It is associated with some mild shortness of breath. It has been slightly worsening in the past several weeks. Patient does not have for cough, fever or chills. Patient has generalized weakness, decreased oral intake and poor appetite recently. No nausea, vomiting, diarrhea or abdominal pain. No symptoms of UTI. His health condition has been declining per his wife. She brought him in so we can "tune him up"     Assessment & Plan:   Principal Problem:   Chest pain Active Problems:   Coronary atherosclerosis   HTN (hypertension)   GERD (gastroesophageal reflux disease)   Chronic diastolic (congestive) heart failure (HCC)   Depression   Failure to thrive in adult   Chronic atrial fibrillation (HCC)   Pulmonary emboli (HCC)   Dementia   Chest pain and hx of CAD: -appreciated cardiology consult -d/c'd echo as not needed -no further cardiac work up  Atrial Fibrillation: CHA2DS2-VASc Score is 5, needs oral anticoagulation but is a HIGH fall risk. Patient is on Xarelto at home.  -will need discussions about stopping due to fall risk  HTN: bp is 168/97 -start amlodipine at 2.5 mg daily  GERD: -Protonix  Chronic diastolic (congestive) heart failure ([employer] [place]): 2-D echo on 01/12/16 showed EF of 55-60%. Patient does not have leg edema JVD. CHF is compensated. -Continue Lasix when necessary  Depression: Stable, no suicidal or  homicidal ideations. -Continue home medications: Wellbutrin, Remeron  HLD: -zocor  Pulmonary emboli ([place]): -on Xarelto  Failure to thrive in adult: -palliative care consult  Dementia: no behavioral disturbance -[place]     DVT prophylaxis:  Fully anticoagulated   Code Status: DNR-- wife to bring in paperwork  Family Communication: Called wife  Disposition Plan:     Consultants:  Palliative care  Subjective: Feels fine  Objective: Vitals:   09/30/17 0852 09/30/17 0900 09/30/17 1050 09/30/17 1300  BP:  (!) 149/91 (!) 158/96 132/75  Pulse:  85 (!) 114 93  Resp: (!) 21 (!) 21  17  Temp:      TempSrc:      SpO2:  97% 99% 97%  Weight:      Height:        Intake/Output Summary (Last 24 hours) at 09/30/2017 1420 Last data filed at 09/30/2017 1210 Gross per 24 hour  Intake 500 ml  Output 275 ml  Net 225 ml   Filed Weights   09/29/17 2110  Weight: 72.6 kg (160 lb)    Examination:  General exam: Appears calm and comfortable -- appears to have a facial droop (h/o CVA) Respiratory system: no increased work of breathing Cardiovascular system: irr Gastrointestinal system: +BS, soft Central nervous system: Alert -- able to answer simple questions about home and wife Psychiatry: impulsive    Data Reviewed: I have personally reviewed following labs and imaging studies  CBC: Recent Labs  Lab 09/29/17 1426  WBC 5.5  HGB 16.8  HCT 50.7  MCV 91.8  PLT 200  Basic Metabolic Panel: Recent Labs  Lab 09/29/17 1426  NA 138  K 4.1  CL 100*  CO2 29  GLUCOSE 102*  BUN 13  CREATININE 1.06  CALCIUM 9.8   GFR: Estimated Creatinine Clearance: 54.2 mL/min (by C-G formula based on SCr of 1.06 mg/dL). Liver Function Tests: No results for input(s): AST, ALT, ALKPHOS, BILITOT, PROT, ALBUMIN in the last 168 hours. No results for input(s): LIPASE, AMYLASE in the last 168 hours. No results for input(s): AMMONIA in the last 168 hours. Coagulation  Profile: No results for input(s): INR, PROTIME in the last 168 hours. Cardiac Enzymes: Recent Labs  Lab 09/30/17 0012  TROPONINI 0.04*   BNP (last 3 results) No results for input(s): PROBNP in the last 8760 hours. HbA1C: No results for input(s): HGBA1C in the last 72 hours. CBG: No results for input(s): GLUCAP in the last 168 hours. Lipid Profile: No results for input(s): CHOL, HDL, LDLCALC, TRIG, CHOLHDL, LDLDIRECT in the last 72 hours. Thyroid Function Tests: No results for input(s): TSH, T4TOTAL, FREET4, T3FREE, THYROIDAB in the last 72 hours. Anemia Panel: No results for input(s): VITAMINB12, FOLATE, FERRITIN, TIBC, IRON, RETICCTPCT in the last 72 hours. Urine analysis:    Component Value Date/Time   COLORURINE YELLOW 09/29/2017 1427   APPEARANCEUR CLEAR 09/29/2017 1427   LABSPEC 1.006 09/29/2017 1427   PHURINE 5.0 09/29/2017 1427   GLUCOSEU NEGATIVE 09/29/2017 1427   HGBUR SMALL (A) 09/29/2017 1427   BILIRUBINUR NEGATIVE 09/29/2017 1427   BILIRUBINUR 1+ 05/11/2015 1420   KETONESUR NEGATIVE 09/29/2017 1427   PROTEINUR NEGATIVE 09/29/2017 1427   UROBILINOGEN 0.2 05/11/2015 1420   UROBILINOGEN 0.2 11/16/2012 1237   NITRITE NEGATIVE 09/29/2017 1427   LEUKOCYTESUR NEGATIVE 09/29/2017 1427     )No results found for this or any previous visit (from the past 240 hour(s)).    Anti-infectives (From admission, onward)   None       Radiology Studies: Dg Chest 2 View  Result Date: 09/29/2017 CLINICAL DATA:  Decreased appetite, weakness EXAM: CHEST - 2 VIEW COMPARISON:  01/11/2016, CT 01/11/2016 FINDINGS: Post sternotomy changes. Large hiatal hernia contributing to retrocardiac opacity. No definite effusion. Mild cardiomegaly. Stable enlarged pulmonary arteries bilaterally. No pneumothorax. IMPRESSION: 1. Hyperinflation. Enlarged central pulmonary arteries suspect for pulmonary hypertension 2. Chronic retrocardiac opacity, felt related to large hiatal hernia. Electronically  Signed   By: Donavan Foil M.D.   On: 09/29/2017 22:11        Scheduled Meds: . amLODipine  2.5 mg Oral Daily  . buPROPion  150 mg Oral Daily  . clotrimazole   Topical BID  . dutasteride  0.5 mg Oral Daily  . ipratropium  0.25 mg Nebulization Q6H  . isosorbide mononitrate  30 mg Oral Daily  . mirtazapine  15 mg Oral QPM  . mupirocin ointment  1 application Topical BID  . pantoprazole  40 mg Oral Daily  . rivaroxaban  20 mg Oral Q supper  . rivastigmine  4.6 mg Transdermal Daily  . saccharomyces boulardii  250 mg Oral BID  . simvastatin  20 mg Oral QHS  . vitamin B-12  500 mcg Oral Daily   Continuous Infusions:   LOS: 0 days    Time spent: 35 min    Geradine Girt, DO [facility] Pager [phone]  If 7PM-7AM, please contact night-coverage [email or web address] Password [email or web address] 09/30/2017, 2:20 PM

## 2017-09-30 NOTE — ED Notes (Addendum)
Pt asking for food. Informed Clark - RN.

## 2017-09-30 NOTE — ED Notes (Signed)
Sitter called pt trying to get out of the bed

## 2017-09-30 NOTE — Plan of Care (Signed)
  Problem: Clinical Measurements: Goal: Ability to maintain clinical measurements within normal limits will improve Outcome: Progressing   Problem: Clinical Measurements: Goal: Cardiovascular complication will be avoided Outcome: Progressing   

## 2017-09-30 NOTE — Evaluation (Addendum)
Occupational Therapy Evaluation Patient Details Name: [name] MRN: [id number] DOB: 1933/03/06 Today's Date: 09/30/2017    History of Present Illness Pt is an 84 y.o. male admitted 09/29/17 with c/o atypical L-side chest pain and failure to thrive. CXR has no infiltration; showed possible hiatal hernia; enlarged pulmonary arteries suspect for pulmonary hypertension. PMH includes dementia, HTN, COPD, BPH, CAD, a-fib, CHF, PE.    Clinical Impression   Pt currently min to mod assist overall for functional ADL tasks sit to stand and toilet transfers.  Will benefit from acute care OT to further increase independence overall.  Pt not able to state PLOF specifically but did state his wife or someone was helping him with ADLs at the home.  Feel unless wife can provided at least min 24 hour assist that pt will need SNF level rehab for follow-up prior to discharge home.  Will continue to follow for acute treatment.      Follow Up Recommendations  SNF    Equipment Recommendations  Other (comment)(TBD next venue of care)       Precautions / Restrictions Precautions Precautions: Fall Restrictions Weight Bearing Restrictions: No      Mobility Bed Mobility Overal bed mobility: Needs Assistance Bed Mobility: Supine to Sit;Sit to Supine     Supine to sit: Min guard Sit to supine: Min assist   General bed mobility comments: Pt needed assist with lifting LEs into the bed when laying back down at end of session.   Transfers Overall transfer level: Needs assistance Equipment used: Quad cane Transfers: Sit to/from [employer] to Stand: Min assist Stand pivot transfers: Mod assist       General transfer comment: Pt reliant on bilat HHA and modA to assist trunk elevation/maintain balance while standing. MinA to assist eccentric control into sitting    Balance Overall balance assessment: Needs assistance Sitting-balance support: No upper extremity supported;Feet  supported Sitting balance-Leahy Scale: Fair Sitting balance - Comments: Dependent to don socks sitting EOB   Standing balance support: Single extremity supported Standing balance-Leahy Scale: Poor Standing balance comment: LOB posteriorly in standing and with functional mobility.                            ADL either performed or assessed with clinical judgement   ADL Overall ADL's : Needs assistance/impaired     Grooming: Wash/dry face;Wash/dry hands;Sitting;Supervision/safety   Upper Body Bathing: Supervision/ safety;Sitting   Lower Body Bathing: Minimal assistance;Sit to/from stand   Upper Body Dressing : Supervision/safety;Sitting(simulated)   Lower Body Dressing: Minimal assistance;Sit to/from stand   Toilet Transfer: Moderate assistance;Ambulation(pt used quad cane)   Toileting- Clothing Manipulation and Hygiene: Minimal assistance       Functional mobility during ADLs: Moderate assistance;Cane General ADL Comments: Pt with moderate LOB posteriorly when attempting to open the door to his room with the left hand while standing with his quad cane in the right hand.  Short step length noted with pt carrying the cane at times on the right and holding to the wall or rail with the left hand.       Vision Baseline Vision/History: Wears glasses Wears Glasses: Reading only Patient Visual Report: No change from baseline              Pertinent Vitals/Pain Pain Assessment: No/denies pain     Hand Dominance     Extremity/Trunk Assessment Upper Extremity Assessment Upper Extremity Assessment: Generalized weakness(AROM shoulder flexion 0-120  degrees bilaterally, overall strength 3+/5 throughout)   Lower Extremity Assessment Lower Extremity Assessment: Defer to PT evaluation   Cervical / Trunk Assessment Cervical / Trunk Assessment: Kyphotic   Communication Communication Communication: HOH   Cognition Arousal/Alertness: Awake/alert Behavior During  Therapy: WFL for tasks assessed/performed Overall Cognitive Status: History of cognitive impairments - at baseline Area of Impairment: Orientation;Memory;Safety/judgement;Awareness;Problem solving                 Orientation Level: Disoriented to;Place;Time;Situation Current Attention Level: Sustained Memory: Decreased short-term memory Following Commands: Follows one step commands consistently Safety/Judgement: Decreased awareness of safety;Decreased awareness of deficits Awareness: Intellectual Problem Solving: Requires verbal cues     General Comments  SpO2 98% on RA, HR 97. Supine BP 170/92, seated BP 192/101, post-mobility BP 168/88; pt asymptomatic             Home Living Family/patient expects to be discharged to:: Private residence Living Arrangements: Spouse/significant other Available Help at Discharge: Family;Available PRN/intermittently Type of Home: House Home Access: Stairs to enter [employer] of Steps: 4 Entrance Stairs-Rails: None Home Layout: One level     Bathroom Shower/Tub: Tub/shower unit         Home Equipment: Cane - quad   Additional Comments: Pt unreliable historian      Prior Functioning/Environment Level of Independence: Needs assistance  Gait / Transfers Assistance Needed: Pt poor historian. He is unsure if he uses cane, RW, or neither at home. States he sits on side of tub to take baths ADL's / Homemaking Assistance Needed: Pt unsure if wife helps him or not. Pt likely requires assist for all ADLs and IADLs            OT Problem List: Decreased strength;Impaired balance (sitting and/or standing);Decreased knowledge of use of DME or AE;Decreased safety awareness;Decreased cognition      OT Treatment/Interventions: Self-care/ADL training;Balance training;Therapeutic activities;DME and/or AE instruction;Cognitive remediation/compensation;Neuromuscular education;Therapeutic exercise;Patient/family education    OT  Goals(Current goals can be found in the care plan section) Acute Rehab OT Goals Patient Stated Goal: Pt did not state but agreeable to therapy OT Goal Formulation: With patient Time For Goal Achievement: 10/14/17 Potential to Achieve Goals: Fair ADL Goals Pt Will Perform Grooming: with supervision;standing(2 tasks standing bat the sink) Pt Will Perform Lower Body Bathing: with supervision;sit to/from stand Pt Will Perform Lower Body Dressing: with supervision;sit to/from stand Pt Will Transfer to Toilet: with supervision;bedside commode;ambulating Pt Will Perform Toileting - Clothing Manipulation and hygiene: with supervision;sit to/from stand  OT Frequency: Min 2X/week   Barriers to D/C: Decreased caregiver support             AM-PAC PT "6 Clicks" Daily Activity     Outcome Measure Help from another person eating meals?: None Help from another person taking care of personal grooming?: A Little Help from another person toileting, which includes using toliet, bedpan, or urinal?: A Lot Help from another person bathing (including washing, rinsing, drying)?: A Lot Help from another person to put on and taking off regular upper body clothing?: None Help from another person to put on and taking off regular lower body clothing?: A Lot 6 Click Score: 17   End of Session Equipment Utilized During Treatment: Gait belt;Other (comment)(Pt's quad cane) Nurse Communication: Mobility status  Activity Tolerance: Patient tolerated treatment well Patient left: in bed;with call bell/phone within reach;with bed alarm set  OT Visit Diagnosis: Unsteadiness on feet (R26.81);Repeated falls (R29.6);Muscle weakness (generalized) (M62.81);Cognitive communication deficit (R41.841) Symptoms and signs  involving cognitive functions: Other cerebrovascular disease                Time: 1020-1049 OT Time Calculation (min): 29 min Charges:  OT General Charges $OT Visit: 1 Visit OT Evaluation $OT Eval Moderate  Complexity: 1 Mod OT Treatments $Self Care/Home Management : 8-22 mins G-Codes: OT G-codes **NOT FOR INPATIENT CLASS** Functional Limitation: Self care Self Care Current Status (F1102): At least 40 percent but less than 60 percent impaired, limited or restricted Self Care Goal Status (T1173): At least 1 percent but less than 20 percent impaired, limited or restricted    Ameia Morency OTR/L 09/30/2017, 11:17 AM

## 2017-10-01 DIAGNOSIS — R627 Adult failure to thrive: Secondary | ICD-10-CM | POA: Diagnosis not present

## 2017-10-01 DIAGNOSIS — Z515 Encounter for palliative care: Secondary | ICD-10-CM

## 2017-10-01 DIAGNOSIS — R072 Precordial pain: Secondary | ICD-10-CM | POA: Diagnosis not present

## 2017-10-01 DIAGNOSIS — Z7189 Other specified counseling: Secondary | ICD-10-CM

## 2017-10-01 DIAGNOSIS — F0390 Unspecified dementia without behavioral disturbance: Secondary | ICD-10-CM | POA: Diagnosis not present

## 2017-10-01 MED ORDER — ENSURE ENLIVE PO LIQD
237.0000 mL | Freq: Two times a day (BID) | ORAL | Status: DC
  Administered 2017-10-01 – 2017-10-02 (×2): 237 mL via ORAL

## 2017-10-01 MED ORDER — IPRATROPIUM BROMIDE 0.02 % IN SOLN
0.2500 mg | Freq: Three times a day (TID) | RESPIRATORY_TRACT | Status: DC
Start: 2017-10-01 — End: 2017-10-02
  Administered 2017-10-01 (×2): 0.25 mg via RESPIRATORY_TRACT
  Filled 2017-10-01 (×5): qty 2.5

## 2017-10-01 NOTE — Progress Notes (Signed)
Initial Nutrition Assessment  DOCUMENTATION CODES:   Not applicable  INTERVENTION:    Ensure Enlive po BID, each supplement provides 350 kcal and 20 grams of protein  NUTRITION DIAGNOSIS:   Inadequate oral intake related to chronic illness as evidenced by meal completion < 50%  GOAL:   Patient will meet greater than or equal to 90% of their needs  MONITOR:   PO intake, Supplement acceptance, Labs, Skin, I & O's  REASON FOR ASSESSMENT:   Consult Assessment of nutrition requirement/status  ASSESSMENT:   84 y.o. Male with PMH of COPD, GERD, ASD (s/p of repair 1983), BPH, CAD, gastric ulcer, atrial fibrillation on Xarelto, dCHF, PE, who presents with chest pain and failure to thrive.  Pt is confused upon RD visit. Nurse Tech present. Nurse Tech reported she fed pt. Consumed 25% of breakfast. Pt with poor appetite, PO intake and general decline per chart review.  Noted Palliative Medicine Team meeting scheduled for today. Per readings below, pt's weight has been stable. Medications include Florastor and Vit B12. Labs reviewed.   NUTRITION - FOCUSED PHYSICAL EXAM:  Deferred at this time.  Diet Order:   Diet Order           Diet Heart Room service appropriate? Yes; Fluid consistency: Thin  Diet effective now         EDUCATION NEEDS:   Not appropriate for education at this time  Skin:  Skin Assessment: Reviewed RN Assessment  Last BM:  unknown  Height:   Ht Readings from Last 1 Encounters:  09/29/17 5\' 11"  (1.803 m)   Weight:   Wt Readings from Last 1 Encounters:  09/29/17 160 lb (72.6 kg)   Wt Readings from Last 10 Encounters:  09/29/17 160 lb (72.6 kg)  08/04/17 153 lb (69.4 kg)  02/21/17 148 lb 4.8 oz (67.3 kg)  01/24/17 148 lb 6.4 oz (67.3 kg)  07/16/16 147 lb (66.7 kg)  07/15/16 143 lb 4 oz (65 kg)  07/01/16 145 lb 6 oz (65.9 kg)  06/17/16 145 lb (65.8 kg)  05/09/16 149 lb 12.8 oz (67.9 kg)  04/22/16 153 lb 6.4 oz (69.6 kg)   Ideal Body  Weight:  78.1 kg  BMI:  Body mass index is 22.32 kg/m.  Estimated Nutritional Needs:   Kcal:  1500-1700  Protein:  70-85 gm  Fluid:  1.5-1.7 L  Arthur Holms, RD, LDN Pager #: [phone] After-Hours Pager #: [phone]

## 2017-10-01 NOTE — Consult Note (Signed)
Consultation Note Date: 10/01/2017   Patient Name: [name]  DOB: Nov 14, 1932  MRN: [id number]  Age / Sex: 84 y.o., male  PCP: [name], MD Referring Physician: Geradine Girt, DO  Reason for Consultation: Establishing goals of care  HPI/Patient Profile: 84 y.o. male  with past medical history of COPD, atrial septal defect repair, CHF-compensated, GERD, hx CVA, dementia, a fib admitted on 09/29/2017 with complaints of chest pain and weakness per his wife. Consult by cardiology recommended no further workup. Chest xray was significant for pulmonary hypertension. Workup otherwise has been negative. Palliative medicine consulted for "?hospice at home?".   Clinical Assessment and Goals of Care:  I have reviewed medical records including EPIC notes, labs and imaging,  assessed the patient and then met at the bedside along with patient's spouse to discuss diagnosis prognosis, GOC, EOL wishes, disposition and options. Patient was unable to tell me why he was in the hospital- therefore unable to participate in [place].   I introduced Palliative Medicine as specialized medical care for people living with serious illness. It focuses on providing relief from the symptoms and stress of a serious illness. The goal is to improve quality of life for both the patient and the family.  We discussed a brief life review of the patient. He and spouse live together in [place]. He and his wife have one daughter who lives next door to them.   As far as functional and nutritional status - he is dependent on his wife for ADL's. He feeds himself. His wife has not noticed a change in his appetite. During admission however, he has not eaten very much. Chart review per dietician shows stable weight with no loss. Hallis was ambulating at home with a walker. He does get SOB with ambulation. [name] notes that he is able to ride in the  car with her and go to church but his dementia behaviors sometime interfere with this.    We discussed their current illness and what it means in the larger context of their on-going co-morbidities.  Natural disease trajectory and expectations at EOL were discussed. Genean notes that [name] never expected to live this long because he was always told he would die young. Big medical interventions were not important to him.   I attempted to elicit values and goals of care important to the patient. [name] does not think [name] would benefit from rehab. She states that when he went before he did not participate.     The difference between aggressive medical intervention and comfort care was considered in light of the patient's goals of care.   Advanced directives, concepts specific to code status, artifical feeding and hydration, and rehospitalization were considered and discussed. Patient is currently DNR. Genean notes if Sher needed to return to the hospital to prolong his life she would choose to do this.   Hospice and Palliative Care services outpatient were explained and offered. [name] is not ready for Hospice at this time. She is interested in resources for  adult daycare. We discussed the limited options of home health that insurance will pay for.   Questions and concerns were addressed.  Hard Choices booklet left for review. The family was encouraged to call with questions or concerns.    Primary Decision Maker NEXT OF KIN - patient's spouse- Genean    SUMMARY OF RECOMMENDATIONS -Unfortunately- patient is not likely eligible for Hospice- and [name] is not at a point to accept Hospice- however, she is in need of support to care for [name]- she is experiencing significant caregiver burden mainly from his dementia it sounds like, but she is reluctant to discuss this as she feels it is just her "lot in life" to care for him -We discussed care manager referral for possible adult day care options in  or around [place] for respite care -I encouraged her to seek help from her church community -We discussed possibly discharging patient to rehab so she could have some respite- but she feels he would decline further than if he went home with PT -PT eval for ambulation as patient was ambulating with walker prior to admission and I would hate for him to get further deconditioned during this admission -I encouraged Genean to discuss Hospice with Dr. Elease Hashimoto sooner than later if patient declines further once home, or there came a time where they would choose to stay home rather than returning to the hospital- I discussed that Hospice was likely to offer a great deal more support than home health    Code Status/Advance Care Planning:  DNR  Palliative Prophylaxis:   Delirium Protocol  Additional Recommendations (Limitations, Scope, Preferences):  Full Scope Treatment  Prognosis:    Unable to determine  Discharge Planning: Home with Home Health  Primary Diagnoses:Present on Admission: . Pulmonary emboli ([place]) . HTN (hypertension) . GERD (gastroesophageal reflux disease) . Depression . Coronary atherosclerosis . Chronic atrial fibrillation ([place]) . Chronic diastolic (congestive) heart failure ([place]) . Chest pain . Failure to thrive in adult . Dementia   I have reviewed the medical record, interviewed the patient and family, and examined the patient. The following aspects are pertinent.  Past Medical History:  Diagnosis Date  . Anemia   . ASD (atrial septal defect)    a. s/p repair 1983.  [name] BPH (benign prostatic hyperplasia)   . Cerebrovascular accident ([place]) [place]  . Chronic bronchitis ([place])    a. h/o resp failure in setting of bronchitis vs PNA 05/2012.  [name] Chronic diarrhea   . COPD (chronic obstructive pulmonary disease) ([place])   . Coronary artery disease    a. Nonobstructive by cath 2009 and 09/06/14  . Depression   . Dysphagia, unspecified(787.20)   . Erosive gastritis     H/o GIB felt secondary to this  . Esophagitis   . Gastric polyp   . Gastric ulcer   . GERD (gastroesophageal reflux disease)   . Hearing impairment   . Hiatal hernia   . History of upper gastrointestinal hemorrhage    dr. Maurene Capes, GI  . Hyperlipidemia   . Hypertension   . Internal hemorrhoids   . LV dysfunction    a. EF 45-50% by echo 05/2012.  [name] Permanent atrial fibrillation (H[facility])    INR followed at [facility].  [name] Vision problems    Social History   Socioeconomic History  . Marital status: Married    Spouse name: Not on file  . Number of children: Not on file  . Years of education: Not on file  . Highest education  level: Not on file  Occupational History  . Not on file  Social Needs  . Financial resource strain: Not on file  . Food insecurity:    Worry: Not on file    Inability: Not on file  . Transportation needs:    Medical: Not on file    Non-medical: Not on file  Tobacco Use  . Smoking status: Former Smoker    Packs/day: 0.00    Years: 0.00    Pack years: 0.00    Types: Cigarettes    Last attempt to quit: 06/13/1983    Years since quitting: 34.3  . Smokeless tobacco: Never Used  Substance and Sexual Activity  . Alcohol use: No  . Drug use: No  . Sexual activity: Not on file  Lifestyle  . Physical activity:    Days per week: Not on file    Minutes per session: Not on file  . Stress: Not on file  Relationships  . Social connections:    Talks on phone: Not on file    Gets together: Not on file    Attends religious service: Not on file    Active member of club or organization: Not on file    Attends meetings of clubs or organizations: Not on file    Relationship status: Not on file  Other Topics Concern  . Not on file  Social History Narrative   Lives in [place] with wife.  Several falls recently, 4 in the last month.        Craig Ionescu  [phone]               Family History  Problem Relation Age of Onset  . Heart disease Mother   . Heart  disease Father   . Prostate cancer Brother    Scheduled Meds: . amLODipine  5 mg Oral Daily  . buPROPion  150 mg Oral Daily  . clotrimazole   Topical BID  . dutasteride  0.5 mg Oral Daily  . feeding supplement (ENSURE ENLIVE)  237 mL Oral BID BM  . ipratropium  0.25 mg Nebulization TID  . isosorbide mononitrate  30 mg Oral Daily  . mirtazapine  15 mg Oral QPM  . mupirocin ointment  1 application Topical BID  . pantoprazole  40 mg Oral Daily  . rivaroxaban  20 mg Oral Q supper  . rivastigmine  4.6 mg Transdermal Daily  . saccharomyces boulardii  250 mg Oral BID  . simvastatin  20 mg Oral QHS  . vitamin B-12  500 mcg Oral Daily   Continuous Infusions: PRN Meds:.acetaminophen, hydrALAZINE, LORazepam, morphine injection, nitroGLYCERIN, ondansetron (ZOFRAN) IV, zolpidem Medications Prior to Admission:  Prior to Admission medications   Medication Sig Start Date End Date Taking? Authorizing Provider  buPROPion (WELLBUTRIN XL) 150 MG 24 hr tablet Take 1 tablet (150 mg total) by mouth daily. 09/01/17  Yes Burchette, Alinda Sierras, MD  clotrimazole-betamethasone (LOTRISONE) cream APPLY TO AFFECTED AREAS TWICE A DAY Patient taking differently: APPLY TO AFFECTED AREAS as needed 06/26/16  Yes Burchette, Alinda Sierras, MD  dutasteride (AVODART) 0.5 MG capsule TAKE (1) CAPSULE DAILY 06/02/17  Yes Burchette, Alinda Sierras, MD  furosemide (LASIX) 20 MG tablet Take 1 tablet (20 mg total) by mouth as needed. For edema 04/22/16  Yes Meng, Raymond, PA  ipratropium (ATROVENT) 0.02 % nebulizer solution NEBULIZE 1 VAIL 4 TIMES A DAY AS DIRECTED Patient taking differently: NEBULIZE 1 VAIL  A DAY AS needed 07/07/17  Yes Burchette, Alinda Sierras, MD  isosorbide mononitrate (IMDUR) 30 MG 24 hr tablet Take 1 tablet (30 mg total) by mouth daily. 08/04/17  Yes Allred, Jeneen Rinks, MD  levalbuterol (XOPENEX) 0.63 MG/3ML nebulizer solution NEBULIZE 1 VIAL (WITH IPRATROPIUM) TWICE A DAY AS NEEDED. MAY USE EXTR A EVERY 4 HOURS IF NEEDED 07/04/17  Yes  Burchette, Alinda Sierras, MD  mirtazapine (REMERON) 15 MG tablet TAKE 1 TABLET DAILY Patient taking differently: TAKE 1 TABLET in the evening 08/15/17  Yes Burchette, Alinda Sierras, MD  mupirocin ointment (BACTROBAN) 2 % Apply 1 application topically 2 (two) times daily.   Yes [provider]  nitroGLYCERIN (NITROSTAT) 0.4 MG SL tablet Place 1 tablet (0.4 mg total) under the tongue every 5 (five) minutes x 3 doses as needed for chest pain. 08/20/14  Yes Theora Gianotti, NP  pantoprazole (PROTONIX) 40 MG tablet TAKE 1 TABLET DAILY 09/22/17  Yes Nandigam, Venia Minks, MD  potassium chloride (K-DUR) 10 MEQ tablet TAKE (2) TABLETS TWICE DAILY. 08/12/17  Yes Burchette, Alinda Sierras, MD  rivastigmine (EXELON) 4.6 mg/24hr PLACE 1 PATCH ONTO SKIN DAILY AS DIRECTED 08/20/17  Yes Burchette, Alinda Sierras, MD  saccharomyces boulardii (FLORASTOR) 250 MG capsule Take 250 mg by mouth 2 (two) times daily.   Yes [provider]  simvastatin (ZOCOR) 20 MG tablet TAKE ONE TABLET AT BEDTIME 04/15/17  Yes Burchette, Alinda Sierras, MD  vitamin B-12 (CYANOCOBALAMIN) 500 MCG tablet Take 500 mcg by mouth daily.   Yes [provider]  XARELTO 20 MG TABS tablet TAKE 1 TABLET DAILY WITH SUPPER 08/29/17  Yes Burchette, Alinda Sierras, MD  potassium chloride (K-DUR) 10 MEQ tablet TAKE (2) TABLETS TWICE DAILY. Patient not taking: Reported on 09/29/2017 02/14/17   [name], MD   Allergies  Allergen Reactions  . Lovenox [Enoxaparin Sodium] Swelling  . Tape Other (See Comments)    SKIN WILL TEAR IF TAPED (MUST BE LIGHT & BRIEF OR USE COBAN WRAP, PLEASE!!)  . Penicillins Hives    Has patient had a PCN reaction causing immediate rash, facial/tongue/throat swelling, SOB or lightheadedness with hypotension: Yes Has patient had a PCN reaction causing severe rash involving mucus membranes or skin necrosis: No Has patient had a PCN reaction that required hospitalization: Unknown Has patient had a PCN reaction occurring within the  last 10 years: No If all of the above answers are "NO", then may proceed with Cephalosporin use.    Review of Systems  Constitutional: Positive for activity change.  Respiratory: Positive for shortness of breath.     Physical Exam  Constitutional:  thin  Cardiovascular: Normal rate and regular rhythm.  Pulmonary/Chest: Effort normal.  Neurological: He is alert.  Skin: Skin is warm and dry.  Psychiatric: He has a normal mood and affect. His behavior is normal.  Nursing note and vitals reviewed.   Vital Signs: BP 135/87   Pulse 92   Temp 98.8 F (37.1 C) (Axillary)   Resp (!) 25   Ht _0  (1.803 m)   Wt 72.6 kg (160 lb)   SpO2 95%   BMI 22.32 kg/m  Pain Scale: 0-10   Pain Score: 0-No pain   SpO2: SpO2: 95 % O2 Device:SpO2: 95 % O2 Flow Rate: .   IO: Intake/output summary:   Intake/Output Summary (Last 24 hours) at 10/01/2017 1435 Last data filed at 10/01/2017 1354 Gross per 24 hour  Intake 360 ml  Output 200 ml  Net 160 ml    LBM: Last BM Date: (pt does not know  when last bm was) Baseline Weight: Weight: 72.6 kg (160 lb) Most recent weight: Weight: 72.6 kg (160 lb)     Palliative Assessment/Data:     Thank you for this consult. Palliative medicine will continue to follow and assist as needed.   Time In: 1330 Time Out: 1500 Time Total: 90 mins Prolong services billed: yes Greater than 50%  of this time was spent counseling and coordinating care related to the above assessment and plan.  Signed by: Mariana Kaufman, AGNP-C Palliative Medicine    Please contact Palliative Medicine Team phone at [phone] for questions and concerns.  For individual provider: See Shea Evans

## 2017-10-01 NOTE — Progress Notes (Signed)
Physical Therapy Treatment Patient Details Name: [name] MRN: [id number] DOB: [birth year] Today's Date: 10/01/2017    History of Present Illness Pt is an 84 y.o. male admitted 09/29/17 with c/o atypical L-side chest pain and failure to thrive. CXR has no infiltration; showed possible hiatal hernia; enlarged pulmonary arteries suspect for pulmonary hypertension. PMH includes dementia, HTN, COPD, BPH, CAD, a-fib, CHF, PE.    PT Comments    Pt slowly progressing with mobility. Able to amb 2' with RW and minA to prevent posterior LOB; performed multiple sit<>stands with RW, requiring consistent minA to maintain balance. Pt incontinent of BM while ambulating, dependent for pericare. Pt at significant increased risk for falls due to generalized weakness, poor balance reactions, and impaired cognition. Will continue to follow acutely.   Follow Up Recommendations  SNF;Supervision/Assistance - 24 hour(potential LTC/hospice?)     Equipment Recommendations  (TBD)    Recommendations for Other Services       Precautions / Restrictions Precautions Precautions: Fall Restrictions Weight Bearing Restrictions: No    Mobility  Bed Mobility Overal bed mobility: Needs Assistance Bed Mobility: Supine to Sit     Supine to sit: Min guard     General bed mobility comments: Min guard for safety; no physical assist required  Transfers Overall transfer level: Needs assistance Equipment used: Rolling walker (2 wheeled) Transfers: Sit to/from Stand Sit to Stand: Min assist         General transfer comment: Pt stood from bed, BSC, and recliner 2x with RW, consistently requiring minA to maintain balance; repeated cues for correct hand placement. Pt with poor eccentric control into sitting requiring cues  Ambulation/Gait Ambulation/Gait assistance: Min assist Ambulation Distance (Feet): 50 Feet Assistive device: Rolling walker (2 wheeled) Gait Pattern/deviations: Step-to pattern;Trunk  flexed;Leaning posteriorly Gait velocity: Decreased Gait velocity interpretation: <1.8 ft/sec, indicate of risk for recurrent falls General Gait Details: Amb laps in room with RW and intermittent minA to prevent posterior LOB. Upon sitting in recliner, pt alerted PT of needing to have BM; able to amb to [facility], but incontinent of some BM while walking. Increased time maneuvering RW around objects   Stairs             Wheelchair Mobility    Modified Rankin (Stroke Patients Only)       Balance Overall balance assessment: Needs assistance Sitting-balance support: No upper extremity supported;Feet supported Sitting balance-Leahy Scale: Fair     Standing balance support: Single extremity supported Standing balance-Leahy Scale: Poor Standing balance comment: LOB posteriorly in standing and with functional mobility.                             Cognition Arousal/Alertness: Awake/alert Behavior During Therapy: WFL for tasks assessed/performed Overall Cognitive Status: History of cognitive impairments - at baseline Area of Impairment: Orientation;Memory;Safety/judgement;Awareness;Problem solving                 Orientation Level: Disoriented to;Place;Time;Situation Current Attention Level: Sustained Memory: Decreased short-term memory Following Commands: Follows one step commands consistently;Follows multi-step commands inconsistently Safety/Judgement: Decreased awareness of safety;Decreased awareness of deficits Awareness: Intellectual Problem Solving: Requires verbal cues        Exercises      General Comments General comments (skin integrity, edema, etc.): SpO2 >90% on RA. HR up to 111. Resting BP 117/61, post-amb BP 124/64      Pertinent Vitals/Pain Pain Assessment: No/denies pain    Home Living  Prior Function            PT Goals (current goals can now be found in the care plan section) Acute Rehab PT  Goals Patient Stated Goal: Pt did not state, but agreeable to participate with PT PT Goal Formulation: With patient Time For Goal Achievement: 10/14/17 Potential to Achieve Goals: Fair Progress towards PT goals: Progressing toward goals    Frequency    Min 2X/week      PT Plan Current plan remains appropriate    Co-evaluation              AM-PAC PT "6 Clicks" Daily Activity  Outcome Measure  Difficulty turning over in bed (including adjusting bedclothes, sheets and blankets)?: A Little Difficulty moving from lying on back to sitting on the side of the bed? : A Little Difficulty sitting down on and standing up from a chair with arms (e.g., wheelchair, bedside commode, etc,.)?: Unable Help needed moving to and from a bed to chair (including a wheelchair)?: A Little Help needed walking in hospital room?: A Lot Help needed climbing 3-5 steps with a railing? : A Lot 6 Click Score: 14    End of Session Equipment Utilized During Treatment: Gait belt Activity Tolerance: Patient tolerated treatment well;Patient limited by fatigue Patient left: in chair;with call bell/phone within reach;with chair alarm set;with nursing/sitter in room Nurse Communication: Mobility status PT Visit Diagnosis: Other abnormalities of gait and mobility (R26.89);Repeated falls (R29.6)     Time: 0254-2706 PT Time Calculation (min) (ACUTE ONLY): 39 min  Charges:  $Gait Training: 8-22 mins $Therapeutic Activity: 23-37 mins                    G Codes:      Mabeline Caras, PT, DPT Acute Rehab Services  Pager: [place] 10/01/2017, 4:34 PM

## 2017-10-01 NOTE — Progress Notes (Signed)
PT was id using DOB name and MRN

## 2017-10-01 NOTE — Discharge Instructions (Addendum)
Nonspecific Chest Pain Chest pain can be caused by many different conditions. There is a chance that your pain could be related to something serious, such as a heart attack or a blood clot in your lungs. Chest pain can also be caused by conditions that are not life-threatening. If you have chest pain, it is very important to follow up with your doctor. Follow these instructions at home: Medicines  If you were prescribed an antibiotic medicine, take it as told by your doctor. Do not stop taking the antibiotic even if you start to feel better.  Take over-the-counter and prescription medicines only as told by your doctor. Lifestyle  Do not use any products that contain nicotine or tobacco, such as cigarettes and e-cigarettes. If you need help quitting, ask your doctor.  Do not drink alcohol.  Make lifestyle changes as told by your doctor. These may include: ? Getting regular exercise. Ask your doctor for some activities that are safe for you. ? Eating a heart-healthy diet. A diet specialist (dietitian) can help you to learn healthy eating options. ? Staying at a healthy weight. ? Managing diabetes, if needed. ? Lowering your stress, as with deep breathing or spending time in nature. General instructions  Avoid any activities that make you feel chest pain.  If your chest pain is because of heartburn: ? Raise (elevate) the head of your bed about 6 inches (15 cm). You can do this by putting blocks under the bed legs at the head of the bed. ? Do not sleep with extra pillows under your head. That does not help heartburn.  Keep all follow-up visits as told by your doctor. This is important. This includes any further testing if your chest pain does not go away. Contact a doctor if:  Your chest pain does not go away.  You have a rash with blisters on your chest.  You have a fever.  You have chills. Get help right away if:  Your chest pain is worse.  You have a cough that gets  worse, or you cough up blood.  You have very bad (severe) pain in your belly (abdomen).  You are very weak.  You pass out (faint).  You have either of these for no clear reason: ? Sudden chest discomfort. ? Sudden discomfort in your arms, back, neck, or jaw.  You have shortness of breath at any time.  You suddenly start to sweat, or your skin gets clammy.  You feel sick to your stomach (nauseous).  You throw up (vomit).  You suddenly feel light-headed or dizzy.  Your heart starts to beat fast, or it feels like it is skipping beats. These symptoms may be an emergency. Do not wait to see if the symptoms will go away. Get medical help right away. Call your local emergency services (911 in the [place] Do not drive yourself to the hospital. This information is not intended to replace advice given to you by your health care provider. Make sure you discuss any questions you have with your health care provider. Document Released: 11/06/2007 Document Revised: 02/12/2016 Document Reviewed: 02/12/2016 Elsevier Interactive Patient Education  2017 [place] on my medicine - XARELTO (Rivaroxaban)  Why was Xarelto prescribed for you? Xarelto was prescribed for you to reduce the risk of a blood clot forming that can cause a stroke if you have a medical condition called atrial fibrillation (a type of irregular heartbeat).  What do you need to know about xarelto ? Take your  Xarelto ONCE DAILY at the same time every day with your evening meal. If you have difficulty swallowing the tablet whole, you may crush it and mix in applesauce just prior to taking your dose.  Take Xarelto exactly as prescribed by your doctor and DO NOT stop taking Xarelto without talking to the doctor who prescribed the medication.  Stopping without other stroke prevention medication to take the place of Xarelto may increase your risk of developing a clot that causes a stroke.  Refill your prescription  before you run out.  After discharge, you should have regular check-up appointments with your healthcare provider that is prescribing your Xarelto.  In the future your dose may need to be changed if your kidney function or weight changes by a significant amount.  What do you do if you miss a dose? If you are taking Xarelto ONCE DAILY and you miss a dose, take it as soon as you remember on the same day then continue your regularly scheduled once daily regimen the next day. Do not take two doses of Xarelto at the same time or on the same day.   Important Safety Information A possible side effect of Xarelto is bleeding. You should call your healthcare provider right away if you experience any of the following: ? Bleeding from an injury or your nose that does not stop. ? Unusual colored urine (red or dark brown) or unusual colored stools (red or black). ? Unusual bruising for unknown reasons. ? A serious fall or if you hit your head (even if there is no bleeding).  Some medicines may interact with Xarelto and might increase your risk of bleeding while on Xarelto. To help avoid this, consult your healthcare provider or pharmacist prior to using any new prescription or non-prescription medications, including herbals, vitamins, non-steroidal anti-inflammatory drugs (NSAIDs) and supplements.  This website has more information on Xarelto: [email or web address]

## 2017-10-01 NOTE — Progress Notes (Signed)
PROGRESS NOTE    Darrell AMSDEN  NKN:[id number] DOB: [birth year] DOA: 09/29/2017 PCP: [name], MD   Outpatient Specialists:     Brief Narrative:  [name] is a 84 y.o. male with medical history significant of Hypertension,hyperlipidemia, COPD, GERD, ASD (s/p of repair 1983), BPH, CAD, gastric ulcer, atrial fibrillation on Xarelto, dCHF, PE, who presents with chest pain and failure to thrive.  Per his wife, pt has been having chest pain for a long time, which has been located in left side of chest, intermittent,moderate, nonradiating. It is associated with some mild shortness of breath. It has been slightly worsening in the past several weeks. Patient does not have for cough, fever or chills. Patient has generalized weakness, decreased oral intake and poor appetite recently. No nausea, vomiting, diarrhea or abdominal pain. No symptoms of UTI. His health condition has been declining per his wife. She brought him in so we can "tune him up"-- have been unable to find anything fixable.     Assessment & Plan:   Principal Problem:   Chest pain Active Problems:   Coronary atherosclerosis   HTN (hypertension)   GERD (gastroesophageal reflux disease)   Chronic diastolic (congestive) heart failure (HCC)   Depression   Failure to thrive in adult   Chronic atrial fibrillation (HCC)   Pulmonary emboli (HCC)   Dementia   Chest pain and hx of CAD: -appreciated cardiology consult -d/c'd echo as not needed -no further cardiac work up  Atrial Fibrillation: CHA2DS2-VASc Score is 5, needs oral anticoagulation but is a HIGH fall risk. Patient is on Xarelto at home.  -will need discussions about stopping due to fall risk in the near future  HTN: bp is 168/97 -increase norvasc started by admittor   GERD: -Protonix  Chronic diastolic (congestive) heart failure ([place]): 2-D echo on 01/12/16 showed EF of 55-60%. Patient does not have leg edema JVD. CHF is  compensated. -Continue Lasix when necessary  Depression: Stable, no suicidal or homicidal ideations. -Continue home medications: Wellbutrin, Remeron  HLD: -zocor  Pulmonary emboli ([place]): -on Xarelto  Failure to thrive in adult: -palliative care consult  Dementia: impulsive, currently requiring sitter -[employer]  No sign of infection or anything that can be fixed to improve patient's ability to maintain at home with wife and improve QOL.  Do not think patient would do well with SNF and rehab.  Palliative care consult and meeting planned for today.  I would recommend hospice.    DVT prophylaxis:  Fully anticoagulated   Code Status: DNR-- wife to bring in paperwork  Family Communication: No family at bedside  Disposition Plan:     Consultants:  Palliative care  Subjective: Sleeping-- per sitter had been un-cooperative before nap  Objective: Vitals:   10/01/17 0500 10/01/17 0817 10/01/17 1023 10/01/17 1356  BP: 107/64 (!) 115/59 135/87   Pulse: 81 89  80  Resp: (!) 23 20  20   Temp: 98.8 F (37.1 C)     TempSrc: Axillary     SpO2: 94% 95%  95%  Weight:      Height:        Intake/Output Summary (Last 24 hours) at 10/01/2017 1358 Last data filed at 10/01/2017 1354 Gross per 24 hour  Intake 360 ml  Output 200 ml  Net 160 ml   Filed Weights   09/29/17 2110  Weight: 72.6 kg (160 lb)    Examination:  General exam: sleeping No increase work of breathing   Data Reviewed: I  have personally reviewed following labs and imaging studies  CBC: Recent Labs  Lab 09/29/17 1426  WBC 5.5  HGB 16.8  HCT 50.7  MCV 91.8  PLT 779   Basic Metabolic Panel: Recent Labs  Lab 09/29/17 1426  NA 138  K 4.1  CL 100*  CO2 29  GLUCOSE 102*  BUN 13  CREATININE 1.06  CALCIUM 9.8   GFR: Estimated Creatinine Clearance: 54.2 mL/min (by C-G formula based on SCr of 1.06 mg/dL). Liver Function Tests: No results for input(s): AST, ALT, ALKPHOS, BILITOT, PROT,  ALBUMIN in the last 168 hours. No results for input(s): LIPASE, AMYLASE in the last 168 hours. No results for input(s): AMMONIA in the last 168 hours. Coagulation Profile: No results for input(s): INR, PROTIME in the last 168 hours. Cardiac Enzymes: Recent Labs  Lab 09/30/17 0012  TROPONINI 0.04*   BNP (last 3 results) No results for input(s): PROBNP in the last 8760 hours. HbA1C: No results for input(s): HGBA1C in the last 72 hours. CBG: No results for input(s): GLUCAP in the last 168 hours. Lipid Profile: No results for input(s): CHOL, HDL, LDLCALC, TRIG, CHOLHDL, LDLDIRECT in the last 72 hours. Thyroid Function Tests: Recent Labs    09/30/17 1434  TSH 1.206   Anemia Panel: No results for input(s): VITAMINB12, FOLATE, FERRITIN, TIBC, IRON, RETICCTPCT in the last 72 hours. Urine analysis:    Component Value Date/Time   COLORURINE YELLOW 09/29/2017 1427   APPEARANCEUR CLEAR 09/29/2017 1427   LABSPEC 1.006 09/29/2017 1427   PHURINE 5.0 09/29/2017 1427   GLUCOSEU NEGATIVE 09/29/2017 1427   HGBUR SMALL (A) 09/29/2017 1427   BILIRUBINUR NEGATIVE 09/29/2017 1427   BILIRUBINUR 1+ 05/11/2015 1420   KETONESUR NEGATIVE 09/29/2017 1427   PROTEINUR NEGATIVE 09/29/2017 1427   UROBILINOGEN 0.2 05/11/2015 1420   UROBILINOGEN 0.2 11/16/2012 1237   NITRITE NEGATIVE 09/29/2017 1427   LEUKOCYTESUR NEGATIVE 09/29/2017 1427     )No results found for this or any previous visit (from the past 240 hour(s)).    Anti-infectives (From admission, onward)   None       Radiology Studies: Dg Chest 2 View  Result Date: 09/29/2017 CLINICAL DATA:  Decreased appetite, weakness EXAM: CHEST - 2 VIEW COMPARISON:  01/11/2016, CT 01/11/2016 FINDINGS: Post sternotomy changes. Large hiatal hernia contributing to retrocardiac opacity. No definite effusion. Mild cardiomegaly. Stable enlarged pulmonary arteries bilaterally. No pneumothorax. IMPRESSION: 1. Hyperinflation. Enlarged central pulmonary  arteries suspect for pulmonary hypertension 2. Chronic retrocardiac opacity, felt related to large hiatal hernia. Electronically Signed   By: Donavan Foil M.D.   On: 09/29/2017 22:11        Scheduled Meds: . amLODipine  5 mg Oral Daily  . buPROPion  150 mg Oral Daily  . clotrimazole   Topical BID  . dutasteride  0.5 mg Oral Daily  . feeding supplement (ENSURE ENLIVE)  237 mL Oral BID BM  . ipratropium  0.25 mg Nebulization TID  . isosorbide mononitrate  30 mg Oral Daily  . mirtazapine  15 mg Oral QPM  . mupirocin ointment  1 application Topical BID  . pantoprazole  40 mg Oral Daily  . rivaroxaban  20 mg Oral Q supper  . rivastigmine  4.6 mg Transdermal Daily  . saccharomyces boulardii  250 mg Oral BID  . simvastatin  20 mg Oral QHS  . vitamin B-12  500 mcg Oral Daily   Continuous Infusions:   LOS: 0 days    Time spent: 15 min  Geradine Girt, DO [facility] Pager [phone]  If 7PM-7AM, please contact night-coverage [email or web address] Password [email or web address] 10/01/2017, 1:58 PM

## 2017-10-01 NOTE — Progress Notes (Signed)
No charge note:  Palliative consult received- chart reviewed-  Meeting arranged with patient's spouse for today at [place], AGNP-C Palliative Medicine  Please call Palliative Medicine team phone with any questions [phone]. For individual providers please see AMION.

## 2017-10-02 DIAGNOSIS — I2510 Atherosclerotic heart disease of native coronary artery without angina pectoris: Secondary | ICD-10-CM | POA: Diagnosis not present

## 2017-10-02 DIAGNOSIS — I482 Chronic atrial fibrillation: Secondary | ICD-10-CM | POA: Diagnosis not present

## 2017-10-02 DIAGNOSIS — I2782 Chronic pulmonary embolism: Secondary | ICD-10-CM | POA: Diagnosis not present

## 2017-10-02 DIAGNOSIS — I159 Secondary hypertension, unspecified: Secondary | ICD-10-CM | POA: Diagnosis not present

## 2017-10-02 DIAGNOSIS — R072 Precordial pain: Secondary | ICD-10-CM | POA: Diagnosis not present

## 2017-10-02 DIAGNOSIS — F0390 Unspecified dementia without behavioral disturbance: Secondary | ICD-10-CM | POA: Diagnosis not present

## 2017-10-02 DIAGNOSIS — R627 Adult failure to thrive: Secondary | ICD-10-CM | POA: Diagnosis not present

## 2017-10-02 DIAGNOSIS — I5032 Chronic diastolic (congestive) heart failure: Secondary | ICD-10-CM | POA: Diagnosis not present

## 2017-10-02 NOTE — Care Management Note (Signed)
Case Management Note Marvetta Gibbons RN, BSN Unit 4E-Case Manager [phone]  Patient Details  Name: [name] MRN: [id number] Date of Birth: 1932/09/12  Subjective/Objective:    Pt presented with FTT, weakness                Action/Plan: PTA pt lived at home with wife- spoke with wife at the bedside regarding transition needs - per wife she is ready to take pt home today- states that she has all needed DME at home for pt including walker, BSC. Wife states she wants "numbers to call when she decides she needs help" asked for clarification what kind of help she was wanting. She stated "aide" help. Explained to wife was [place] would cover as far as [facility] services under Medicare- wife states that she does not want [place] services at this time. Discussed private duty help and referred her to Medicare.gov for future assistance when she is ready to find an agency to assist in the home. Also discussed [facility]ACE as an option- wife agreeable to info- brochure provided to wife on PACE program. Wife requesting pt to be discharged home today- will have MD paged for discharge order- no CM needs identified for transition home has wife has declined any services at this time and does not want SNF placement for any attempt at rehab.   Expected Discharge Date:  10/02/17               Expected Discharge Plan:  [place]  In-House Referral:  Clinical Social Work, Hospice / Palliative Care  Discharge planning Services  CM Consult  Post Acute Care Choice:  NA Choice offered to:  Spouse  DME Arranged:    DME Agency:     HH Arranged:    [place] Agency:     Status of Service:  Completed, signed off  If discussed at [employer] of Stay Meetings, dates discussed:    Discharge Disposition: home/self care   Additional Comments:  Dawayne Patricia, RN 10/02/2017, 2:35 PM

## 2017-10-02 NOTE — Progress Notes (Signed)
10/02/2017 3:52 PM Discharge AVS meds taken today and those due this evening reviewed.  Follow-up appointments and when to call md reviewed.  D/C IV and TELE.  Questions and concerns addressed.   D/C home per orders. [place]

## 2017-10-03 ENCOUNTER — Telehealth: Payer: Self-pay | Admitting: *Deleted

## 2017-10-03 NOTE — Telephone Encounter (Signed)
Transition Care Management Follow-up Telephone Call  Call completed w/ patient's wife (on [place]).   Date discharged? 10/02/2017, no discharge summary available   How have you been since you were released from the hospital? "He's been doing good."   Do you understand why you were in the hospital? yes   Do you understand the discharge instructions? yes   Where were you discharged to? Home   Items Reviewed:  Medications reviewed: yes  Allergies reviewed: yes  Dietary changes reviewed: yes  Referrals reviewed: yes   Functional Questionnaire:   Activities of Daily Living (ADLs):   He states they are independent in the following: ambulation, feeding, continence, grooming, toileting and dressing States they require assistance with the following: bathing and hygiene   Any transportation issues/concerns?: no   Any patient concerns? no   Confirmed importance and date/time of follow-up visits scheduled yes  Provider Appointment booked with Dr. Carolann Littler 10/06/17 @ 1pm  Confirmed with patient if condition begins to worsen call PCP or go to the ER.  Patient was given the office number and encouraged to call back with question or concerns.  : yes

## 2017-10-06 ENCOUNTER — Ambulatory Visit: Payer: Medicare Other | Admitting: Family Medicine

## 2017-10-06 ENCOUNTER — Encounter: Payer: Self-pay | Admitting: Family Medicine

## 2017-10-06 VITALS — BP 134/78 | HR 80 | Temp 98.5°F | Wt 146.7 lb

## 2017-10-06 DIAGNOSIS — I5032 Chronic diastolic (congestive) heart failure: Secondary | ICD-10-CM

## 2017-10-06 DIAGNOSIS — I482 Chronic atrial fibrillation: Secondary | ICD-10-CM | POA: Diagnosis not present

## 2017-10-06 DIAGNOSIS — R627 Adult failure to thrive: Secondary | ICD-10-CM | POA: Diagnosis not present

## 2017-10-06 DIAGNOSIS — F329 Major depressive disorder, single episode, unspecified: Secondary | ICD-10-CM | POA: Diagnosis not present

## 2017-10-06 DIAGNOSIS — I159 Secondary hypertension, unspecified: Secondary | ICD-10-CM

## 2017-10-06 DIAGNOSIS — I4821 Permanent atrial fibrillation: Secondary | ICD-10-CM

## 2017-10-06 DIAGNOSIS — F32A Depression, unspecified: Secondary | ICD-10-CM

## 2017-10-06 NOTE — Progress Notes (Signed)
Subjective:     Patient ID: [name], male   DOB: 1933/04/01, 84 y.o.   MRN: [id number]  HPI Patient seen for hospital follow-up. Discharge summary is pending at this time. He was admitted on April 29 with basically some failure to thrive and chest pain (which has been a more chronic complaint for him)... His wife states he just wasn't drinking much and she couldn't seem to get him to drink more. His labs were fairly unremarkable. Chest x-ray showed no acute findings. Patient felt better after IV fluids.  He denied any cough, fever, dysuria, abdominal pain. Patient is on chronic Xarelto. Past history of anemia but hemoglobin was stable. No recent bleeding complications. He was discharged home after few days. Wife feels he is at baseline at this time.  Ambulating with a walker most of the time. No recent reported falls. Medications reviewed. He does have history of recurrent depression and they feel this is stable currently on Wellbutrin and mirtazapine They declined any further PT or other home services.  Past Medical History:  Diagnosis Date  . Anemia   . ASD (atrial septal defect)    a. s/p repair 1983.  [name] BPH (benign prostatic hyperplasia)   . Cerebrovascular accident ([place]) [place]  . Chronic bronchitis ([place])    a. h/o resp failure in setting of bronchitis vs PNA 05/2012.  [name] Chronic diarrhea   . COPD (chronic obstructive pulmonary disease) ([place])   . Coronary artery disease    a. Nonobstructive by cath 2009 and 09/06/14  . Depression   . Dysphagia, unspecified(787.20)   . Erosive gastritis    H/o GIB felt secondary to this  . Esophagitis   . Gastric polyp   . Gastric ulcer   . GERD (gastroesophageal reflux disease)   . Hearing impairment   . Hiatal hernia   . History of upper gastrointestinal hemorrhage    dr. Maurene Capes, GI  . Hyperlipidemia   . Hypertension   . Internal hemorrhoids   . LV dysfunction    a. EF 45-50% by echo 05/2012.  [name] Permanent atrial fibrillation (HCC)    INR followed at [facility].  [name] Vision problems    Past Surgical History:  Procedure Laterality Date  . ASD REPAIR  1983  . CARDIOVERSION     possibly 3 times, dr. brody/cooper  . CHOLECYSTECTOMY  1990s  . ENTEROSCOPY  05/13/2012   Procedure: ENTEROSCOPY;  Surgeon: Inda Castle, MD;  Location: [facility] ENDOSCOPY;  Service: Endoscopy;  Laterality: N/A;  . [place]  . LEFT HEART CATHETERIZATION WITH CORONARY ANGIOGRAM N/A 09/06/2014   Procedure: LEFT HEART CATHETERIZATION WITH CORONARY ANGIOGRAM;  Surgeon: Jettie Booze, MD;  Location: [facility] CATH LAB;  Service: Cardiovascular;  Laterality: N/A;  . partial small bowel resection  2005   ischemic?    reports that he quit smoking about 34 years ago. His smoking use included cigarettes. He smoked 0.00 packs per day for 0.00 years. He has never used smokeless tobacco. He reports that he does not drink alcohol or use drugs. family history includes Heart disease in his father and mother; Prostate cancer in his brother. Allergies  Allergen Reactions  . Lovenox [Enoxaparin Sodium] Swelling  . Tape Other (See Comments)    SKIN WILL TEAR IF TAPED (MUST BE LIGHT & BRIEF OR USE COBAN WRAP, PLEASE!!)  . Penicillins Hives    Has patient had a PCN reaction causing immediate rash, facial/tongue/throat swelling, SOB or lightheadedness with hypotension: Yes  Has patient had a PCN reaction causing severe rash involving mucus membranes or skin necrosis: No Has patient had a PCN reaction that required hospitalization: Unknown Has patient had a PCN reaction occurring within the last 10 years: No If all of the above answers are "NO", then may proceed with Cephalosporin use.      Review of Systems  Constitutional: Positive for fatigue. Negative for chills, fever and unexpected weight change.  Eyes: Negative for visual disturbance.  Respiratory: Negative for cough, chest tightness and shortness of breath.   Cardiovascular: Negative for chest pain,  palpitations and leg swelling.  Gastrointestinal: Negative for abdominal pain, diarrhea, nausea and vomiting.  Genitourinary: Negative for dysuria.  Neurological: Negative for dizziness, seizures, syncope, light-headedness and headaches.  Psychiatric/Behavioral: Negative for confusion.       Objective:   Physical Exam  Constitutional: He is oriented to person, place, and time.  Alert cooperative somewhat frail appearing 84 year old male  HENT:  Right Ear: External ear normal.  Left Ear: External ear normal.  Neck: Neck supple. No thyromegaly present.  Cardiovascular:  Irregular rhythm rate controlled  Pulmonary/Chest: Effort normal and breath sounds normal.  Abdominal: Soft. There is no tenderness.  Musculoskeletal: He exhibits no edema.  Neurological: He is alert and oriented to person, place, and time. No cranial nerve deficit.       Assessment:     #1 adult failure to thrive. Patient has a long history of intermittent weakness and poor oral intake and very sedentary. Symptomatically improved following IV fluids.  #2 history of chronic atrial fibrillation on Xarelto. Currently rate controlled  #3 history of depression currently stable  #4 hypertension stable  #5 history of chronic diastolic heart failure    Plan:     -Continue current medications -We made some suggestions on ways to try to increase his fluid intake perhaps with things like smoothies, popsicles, Jell-O. He does not have any diabetes history. -He has been reluctant to take things like boost or ensure -Offered further physical therapy through home therapy but they decline at this time -Plan routine follow-up in 4 months. Repeat lipids at that point -suggest walker use at all times.  [name] MD [facility]

## 2017-10-06 NOTE — Patient Instructions (Signed)
Increase hydration- maybe try some smoothies, jello, etc as alternatives for fluid  Use walker at all times.    Follow up for chest pain or other concerns.

## 2017-10-20 ENCOUNTER — Other Ambulatory Visit: Payer: Self-pay | Admitting: Family Medicine

## 2017-10-20 ENCOUNTER — Other Ambulatory Visit: Payer: Self-pay | Admitting: Gastroenterology

## 2017-10-21 ENCOUNTER — Other Ambulatory Visit: Payer: Self-pay | Admitting: Family Medicine

## 2017-10-24 NOTE — Discharge Summary (Signed)
Physician Discharge Summary  [name] ZDG:[id number] DOB: August 16, 1932 DOA: 09/29/2017  PCP: [name], MD  Admit date: 09/29/2017 Discharge date: 10/02/2017  Admitted From: Home Discharge disposition: Home   Recommendations for Outpatient Follow-Up:   1. Home health services set up.   Discharge Diagnosis:   Principal Problem:   Chest pain Active Problems:   Coronary atherosclerosis   HTN (hypertension)   GERD (gastroesophageal reflux disease)   Chronic diastolic (congestive) heart failure (HCC)   Depression   Failure to thrive in adult   Chronic atrial fibrillation (HCC)   Pulmonary emboli (HCC)   Dementia  Discharge Condition: Improved.  Diet recommendation: Low sodium, heart healthy.  .  Code status: DNR.   History of Present Illness:   Darrell Schnick Sharonis a 84 y.o.malewith medical history significant ofHypertension,hyperlipidemia, COPD, GERD, ASD (s/p of repair 1983), BPH, CAD, gastric ulcer, atrial fibrillation on Xarelto, dCHF, PE, who presents with chest pain and failure to thrive.  Per his wife, pt has been having chest pain for a long time, which has been located in left side of chest, intermittent,moderate, nonradiating. It is associated with some mild shortness of breath. It has been slightly worsening in the past several weeks. Patient does not have for cough, fever or chills. Patient has generalized weakness, decreased oral intake and poor appetite recently. No nausea, vomiting, diarrhea or abdominal pain. No symptoms of UTI. His health condition has been declining per his wife. She brought him in so we can "tune him up"-- have been unable to find anything fixable.  Hospital Course by Problem:   Principal problem: Chest painand hx of CAD: Evaluated by cardiologist. Per cardiology notes: EKG shows Afib with RBBB. Trop 0.02>>0.04. Last cath was back in 4/16 with minimal CAD. Given his advanced age, possible dementia and flat troponin trend  would not consider him a candidate for further invasive work up at this time.  Active problems: Atrial Fibrillation CHA2DS2-VASc Scoreis 5, needs oral anticoagulation but is a HIGH fall risk. Patient was onXareltoat home and was discharged on same but will need consideration of discontinuation in light of fall risk.   HTN Norvasc dose increased.  GERD Continue Protonix.  Chronic diastolic (congestive) heart failure (HCC) 2-D echo on 01/12/16 showed EF of 55-60%. Patient not noted to have leg edema or JVD. CHF felt to be compensated. Continue Lasix when necessary  Depression Stable, no suicidal or homicidal ideations. Continue home medications:Wellbutrin, Remeron.  HLD Continue Zocor.  Pulmonary emboli (HCC) Continue Xarelto.  Failure to thrive in adult Palliative care consultation performed.  Patient not eligible for hospice per notes, and family not at a point to accept hospice but rather needs assistance with regard to caregiver burden.  Encouraged to follow-up with PCP.[name]  Dementia Continue Exelon.  Required a sitter while in the hospital.   Medical Consultants:    Cardiology.  Palliative Care.   Discharge Exam:   Vitals:   10/02/17 0958 10/02/17 1323  BP: (!) 119/57 117/78  Pulse:    Resp:  18  Temp:  97.8 F (36.6 C)  SpO2:  95%   Vitals:   10/01/17 1625 10/01/17 1939 10/02/17 0958 10/02/17 1323  BP: 126/64  (!) 119/57 117/78  Pulse: 95 97    Resp: (!) 24 18  18   Temp: 98.3 F (36.8 C)   97.8 F (36.6 C)  TempSrc: Oral   Oral  SpO2: 100% 96%  95%  Weight:      Height:  Discharge exam performed by Dr. Aggie Moats, who actually discharged the patient.  The results of significant diagnostics from this hospitalization (including imaging, microbiology, ancillary and laboratory) are listed below for reference.     Procedures and Diagnostic Studies:   Dg Chest 2 View  Result Date: 09/29/2017 CLINICAL DATA:  Decreased appetite, weakness  EXAM: CHEST - 2 VIEW COMPARISON:  01/11/2016, CT 01/11/2016 FINDINGS: Post sternotomy changes. Large hiatal hernia contributing to retrocardiac opacity. No definite effusion. Mild cardiomegaly. Stable enlarged pulmonary arteries bilaterally. No pneumothorax. IMPRESSION: 1. Hyperinflation. Enlarged central pulmonary arteries suspect for pulmonary hypertension 2. Chronic retrocardiac opacity, felt related to large hiatal hernia. Electronically Signed   By: Donavan Foil M.D.   On: 09/29/2017 22:11     Labs:   Troponin 0 0.04, TSH WNL, BNP 70.9, WBC 5.5, hemoglobin 16.8, hematocrit 50.7, platelets 200, sodium 138, potassium 4.1, chloride 100, bicarb 29, glucose 102, BUN 13, creatinine 1.06.  Discharge Instructions:   Discharge Instructions    Call MD for:  difficulty breathing, headache or visual disturbances   Complete by:  As directed    Call MD for:  persistant dizziness or light-headedness   Complete by:  As directed    Call MD for:  persistant nausea and vomiting   Complete by:  As directed    Call MD for:  redness, tenderness, or signs of infection (pain, swelling, redness, odor or green/yellow discharge around incision site)   Complete by:  As directed    Call MD for:  temperature >100.4   Complete by:  As directed    Diet - low sodium heart healthy   Complete by:  As directed    Increase activity slowly   Complete by:  As directed      Allergies as of 10/02/2017      Reactions   Lovenox [enoxaparin Sodium] Swelling   Tape Other (See Comments)   SKIN WILL TEAR IF TAPED (MUST BE LIGHT & BRIEF OR USE COBAN WRAP, PLEASE!!)   Penicillins Hives   Has patient had a PCN reaction causing immediate rash, facial/tongue/throat swelling, SOB or lightheadedness with hypotension: Yes Has patient had a PCN reaction causing severe rash involving mucus membranes or skin necrosis: No Has patient had a PCN reaction that required hospitalization: Unknown Has patient had a PCN reaction occurring  within the last 10 years: No If all of the above answers are "NO", then may proceed with Cephalosporin use.      Medication List    TAKE these medications   buPROPion 150 MG 24 hr tablet Commonly known as:  WELLBUTRIN XL Take 1 tablet (150 mg total) by mouth daily.   clotrimazole-betamethasone cream Commonly known as:  LOTRISONE APPLY TO AFFECTED AREAS TWICE A DAY What changed:  See the new instructions.   dutasteride 0.5 MG capsule Commonly known as:  AVODART TAKE (1) CAPSULE DAILY   furosemide 20 MG tablet Commonly known as:  LASIX Take 1 tablet (20 mg total) by mouth as needed. For edema   ipratropium 0.02 % nebulizer solution Commonly known as:  ATROVENT NEBULIZE 1 VAIL 4 TIMES A DAY AS DIRECTED What changed:  See the new instructions.   isosorbide mononitrate 30 MG 24 hr tablet Commonly known as:  IMDUR Take 1 tablet (30 mg total) by mouth daily.   levalbuterol 0.63 MG/3ML nebulizer solution Commonly known as:  XOPENEX NEBULIZE 1 VIAL (WITH IPRATROPIUM) TWICE A DAY AS NEEDED. MAY USE EXTR A EVERY 4 HOURS IF NEEDED  mirtazapine 15 MG tablet Commonly known as:  REMERON TAKE 1 TABLET DAILY What changed:    how much to take  how to take this  when to take this   mupirocin ointment 2 % Commonly known as:  BACTROBAN Apply 1 application topically 2 (two) times daily.   nitroGLYCERIN 0.4 MG SL tablet Commonly known as:  NITROSTAT Place 1 tablet (0.4 mg total) under the tongue every 5 (five) minutes x 3 doses as needed for chest pain.   potassium chloride 10 MEQ tablet Commonly known as:  K-DUR TAKE (2) TABLETS TWICE DAILY. What changed:  Another medication with the same name was removed. Continue taking this medication, and follow the directions you see here.   rivastigmine 4.6 mg/24hr Commonly known as:  EXELON PLACE 1 PATCH ONTO SKIN DAILY AS DIRECTED   saccharomyces boulardii 250 MG capsule Commonly known as:  FLORASTOR Take 250 mg by mouth 2 (two)  times daily.   vitamin B-12 500 MCG tablet Commonly known as:  CYANOCOBALAMIN Take 500 mcg by mouth daily.   XARELTO 20 MG Tabs tablet Generic drug:  rivaroxaban TAKE 1 TABLET DAILY WITH SUPPER         Time coordinating discharge: 25 minutes.  SignedMargreta Journey Devra Stare  Pager [phone] [facility] 10/24/2017, 10:45 AM

## 2017-10-28 DIAGNOSIS — Z515 Encounter for palliative care: Secondary | ICD-10-CM

## 2017-10-28 DIAGNOSIS — Z7189 Other specified counseling: Secondary | ICD-10-CM

## 2017-11-26 ENCOUNTER — Other Ambulatory Visit: Payer: Self-pay | Admitting: Gastroenterology

## 2017-11-26 ENCOUNTER — Other Ambulatory Visit: Payer: Self-pay | Admitting: Family Medicine

## 2017-11-27 ENCOUNTER — Other Ambulatory Visit: Payer: Self-pay | Admitting: Family Medicine

## 2017-12-11 ENCOUNTER — Telehealth: Payer: Self-pay | Admitting: Gastroenterology

## 2017-12-11 MED ORDER — PANTOPRAZOLE SODIUM 40 MG PO TBEC: 40.0000 mg | DELAYED_RELEASE_TABLET | Freq: Every day | ORAL | 0 refills | Status: DC

## 2017-12-11 NOTE — Telephone Encounter (Signed)
Sent in refills of Protonix to patients requested pharmacy

## 2017-12-19 ENCOUNTER — Other Ambulatory Visit: Payer: Self-pay | Admitting: Family Medicine

## 2017-12-26 ENCOUNTER — Other Ambulatory Visit: Payer: Self-pay | Admitting: Family Medicine

## 2017-12-28 ENCOUNTER — Other Ambulatory Visit: Payer: Self-pay

## 2017-12-28 ENCOUNTER — Emergency Department (HOSPITAL_COMMUNITY)
Admission: EM | Admit: 2017-12-28 | Discharge: 2017-12-28 | Disposition: A | Discharge status: Home / Self Care | Payer: Medicare Other | Attending: Emergency Medicine | Admitting: Emergency Medicine

## 2017-12-28 ENCOUNTER — Emergency Department (HOSPITAL_COMMUNITY): Payer: Medicare Other

## 2017-12-28 ENCOUNTER — Encounter (HOSPITAL_COMMUNITY): Payer: Self-pay

## 2017-12-28 DIAGNOSIS — W19XXXA Unspecified fall, initial encounter: Secondary | ICD-10-CM | POA: Insufficient documentation

## 2017-12-28 DIAGNOSIS — Y999 Unspecified external cause status: Secondary | ICD-10-CM | POA: Diagnosis not present

## 2017-12-28 DIAGNOSIS — I110 Hypertensive heart disease with heart failure: Secondary | ICD-10-CM | POA: Insufficient documentation

## 2017-12-28 DIAGNOSIS — F0390 Unspecified dementia without behavioral disturbance: Secondary | ICD-10-CM | POA: Diagnosis not present

## 2017-12-28 DIAGNOSIS — S32028A Other fracture of second lumbar vertebra, initial encounter for closed fracture: Secondary | ICD-10-CM | POA: Diagnosis not present

## 2017-12-28 DIAGNOSIS — Y929 Unspecified place or not applicable: Secondary | ICD-10-CM | POA: Diagnosis not present

## 2017-12-28 DIAGNOSIS — S2241XA Multiple fractures of ribs, right side, initial encounter for closed fracture: Secondary | ICD-10-CM | POA: Insufficient documentation

## 2017-12-28 DIAGNOSIS — Z87891 Personal history of nicotine dependence: Secondary | ICD-10-CM | POA: Insufficient documentation

## 2017-12-28 DIAGNOSIS — Y939 Activity, unspecified: Secondary | ICD-10-CM | POA: Insufficient documentation

## 2017-12-28 DIAGNOSIS — I5023 Acute on chronic systolic (congestive) heart failure: Secondary | ICD-10-CM | POA: Insufficient documentation

## 2017-12-28 DIAGNOSIS — Z79899 Other long term (current) drug therapy: Secondary | ICD-10-CM | POA: Insufficient documentation

## 2017-12-28 DIAGNOSIS — J449 Chronic obstructive pulmonary disease, unspecified: Secondary | ICD-10-CM | POA: Diagnosis not present

## 2017-12-28 DIAGNOSIS — S32020A Wedge compression fracture of second lumbar vertebra, initial encounter for closed fracture: Secondary | ICD-10-CM

## 2017-12-28 LAB — URINALYSIS, ROUTINE W REFLEX MICROSCOPIC
Bilirubin Urine: NEGATIVE
GLUCOSE, UA: NEGATIVE mg/dL
HGB URINE DIPSTICK: NEGATIVE
Ketones, ur: NEGATIVE mg/dL
LEUKOCYTES UA: NEGATIVE
Nitrite: NEGATIVE
PROTEIN: NEGATIVE mg/dL
SPECIFIC GRAVITY, URINE: 1.013 (ref 1.005–1.030)
pH: 6 (ref 5.0–8.0)

## 2017-12-28 LAB — CBC WITH DIFFERENTIAL/PLATELET
Abs Immature Granulocytes: 0 10*3/uL (ref 0.0–0.1)
Basophils Absolute: 0 10*3/uL (ref 0.0–0.1)
Basophils Relative: 1 %
Eosinophils Absolute: 0.1 10*3/uL (ref 0.0–0.7)
Eosinophils Relative: 1 %
HCT: 47.6 % (ref 39.0–52.0)
HEMOGLOBIN: 15.4 g/dL (ref 13.0–17.0)
IMMATURE GRANULOCYTES: 1 %
LYMPHS PCT: 8 %
Lymphs Abs: 0.7 10*3/uL (ref 0.7–4.0)
MCH: 30.2 pg (ref 26.0–34.0)
MCHC: 32.4 g/dL (ref 30.0–36.0)
MCV: 93.3 fL (ref 78.0–100.0)
MONO ABS: 0.7 10*3/uL (ref 0.1–1.0)
MONOS PCT: 7 %
NEUTROS PCT: 82 %
Neutro Abs: 7.3 10*3/uL (ref 1.7–7.7)
Platelets: 141 10*3/uL — ABNORMAL LOW (ref 150–400)
RBC: 5.1 MIL/uL (ref 4.22–5.81)
RDW: 13.2 % (ref 11.5–15.5)
WBC: 8.8 10*3/uL (ref 4.0–10.5)

## 2017-12-28 LAB — COMPREHENSIVE METABOLIC PANEL
ALT: 19 U/L (ref 0–44)
AST: 24 U/L (ref 15–41)
Albumin: 3.9 g/dL (ref 3.5–5.0)
Alkaline Phosphatase: 62 U/L (ref 38–126)
Anion gap: 10 (ref 5–15)
BILIRUBIN TOTAL: 1.5 mg/dL — AB (ref 0.3–1.2)
BUN: 13 mg/dL (ref 8–23)
CHLORIDE: 106 mmol/L (ref 98–111)
CO2: 26 mmol/L (ref 22–32)
Calcium: 9.3 mg/dL (ref 8.9–10.3)
Creatinine, Ser: 1 mg/dL (ref 0.61–1.24)
GLUCOSE: 113 mg/dL — AB (ref 70–99)
Potassium: 3.8 mmol/L (ref 3.5–5.1)
Sodium: 142 mmol/L (ref 135–145)
Total Protein: 6.8 g/dL (ref 6.5–8.1)

## 2017-12-28 MED ORDER — IPRATROPIUM-ALBUTEROL 0.5-2.5 (3) MG/3ML IN SOLN
3.0000 mL | Freq: Once | RESPIRATORY_TRACT | Status: AC
  Administered 2017-12-28: 3 mL via RESPIRATORY_TRACT
  Filled 2017-12-28: qty 3

## 2017-12-28 MED ORDER — SODIUM CHLORIDE 0.9 % IV BOLUS
1000.0000 mL | Freq: Once | INTRAVENOUS | Status: AC
Start: 2017-12-28 — End: 2017-12-28
  Administered 2017-12-28: 500 mL via INTRAVENOUS

## 2017-12-28 MED ORDER — HYDROCODONE-ACETAMINOPHEN 5-325 MG PO TABS: 1.0000 | ORAL_TABLET | ORAL | 0 refills | Status: DC | PRN

## 2017-12-28 NOTE — ED Notes (Signed)
Patient transported to X-ray 

## 2017-12-28 NOTE — ED Provider Notes (Signed)
[facility] EMERGENCY DEPARTMENT Provider Note   CSN: [id number] Arrival date & time: 12/28/17  1240     History   Chief Complaint Chief Complaint  Patient presents with  . Back Pain    HPI [name] is a 84 y.o. male.  Pt presents to the ED today with back pain from a fall.  Pt has a hx of ambulatory dysfunction and is supposed to walk with a walker.  He does not always walk with a walker.  He fell 2 nights ago and fell again this morning.  The pt did not hit his head or have a loc.  The pt is on chronic Xarelto for a.fib.  He is sob with any movement.     Past Medical History:  Diagnosis Date  . Anemia   . ASD (atrial septal defect)    a. s/p repair 1983.  [name] BPH (benign prostatic hyperplasia)   . Cerebrovascular accident ([place]) [place]  . Chronic bronchitis ([place])    a. h/o resp failure in setting of bronchitis vs PNA 05/2012.  [name] Chronic diarrhea   . COPD (chronic obstructive pulmonary disease) ([place])   . Coronary artery disease    a. Nonobstructive by cath 2009 and 09/06/14  . Depression   . Dysphagia, unspecified(787.20)   . Erosive gastritis    H/o GIB felt secondary to this  . Esophagitis   . Gastric polyp   . Gastric ulcer   . GERD (gastroesophageal reflux disease)   . Hearing impairment   . Hiatal hernia   . History of upper gastrointestinal hemorrhage    dr. Maurene Capes, GI  . Hyperlipidemia   . Hypertension   . Internal hemorrhoids   . LV dysfunction    a. EF 45-50% by echo 05/2012.  [name] Permanent atrial fibrillation (HCC)    [facility].  [name] Vision problems     Patient Active Problem List   Diagnosis Date Noted  . Palliative care by specialist   . Advance care planning   . Goals of care, counseling/discussion   . Dementia 09/30/2017  . Chest pain 09/29/2017  . Elevated PSA 02/02/2016  . Pulmonary emboli ([place]) 01/11/2016  . Actinic keratoses 11/16/2015  . Memory loss 11/16/2015  . Atypical chest pain 01/28/2015  . Right  carotid bruit 09/26/2014  . Abnormal stress test   . Chronic atrial fibrillation ([place]) 09/04/2014  . On warfarin therapy 09/04/2014  . Encounter for therapeutic drug monitoring 07/26/2013  . Bradycardia 02/17/2013  . Acute on chronic systolic CHF (congestive heart failure) ([place]) 11/16/2012  . Acute on chronic renal failure ([place]) 11/16/2012  . Failure to thrive in adult 10/30/2012  . Anemia of chronic disease 10/13/2012  . Toxic encephalopathy secondary to UTI 10/12/2012  . Depression 10/12/2012  . Moderate protein-calorie malnutrition ([place]) 10/12/2012  . Stage III chronic kidney disease ([place]) 10/12/2012  . CHF (congestive heart failure) ([place]) 09/06/2012  . Chronic diastolic (congestive) heart failure ([place]) 07/23/2012  . Hypokalemia 07/20/2012  . Hypomagnesemia 07/20/2012  . Hypocalcemia 07/20/2012  . Acute kidney injury ([place]) 07/20/2012  . Chronic diarrhea 07/20/2012  . Falls frequently 07/20/2012  . Leukocytosis 07/20/2012  . Normocytic anemia 07/20/2012  . Chronic respiratory failure ([place]) 05/23/2012  . COPD exacerbation ([place]) 05/22/2012  . Gastritis with hemorrhage 05/13/2012  . Stricture and stenosis of esophagus 05/13/2012  . Acute posthemorrhagic anemia 05/12/2012  . Atrial fibrillation with rapid ventricular response ([place]) 05/06/2012  . Dysphagia 05/06/2012  . HTN (  hypertension) 05/06/2012  . H/O: CVA (cerebrovascular accident) 05/06/2012  . GERD (gastroesophageal reflux disease) 05/06/2012  . Purulent bronchitis ([place]) 05/06/2012  . SOB (shortness of breath) 03/19/2010  . ABDOMINAL DISTENSION 03/19/2010  . FATIGUE 11/09/2009  . B12 DEFICIENCY 07/26/2009  . Hyperglycemia 07/26/2009  . Hyperlipidemia 07/20/2009  . Hereditary and idiopathic peripheral neuropathy 07/20/2009  . BENIGN PROSTATIC HYPERTROPHY, HX OF 07/20/2009  . Essential hypertension 09/05/2007  . Coronary atherosclerosis 09/05/2007  . FIBRILLATION, ATRIAL 09/05/2007  . Cerebral artery occlusion with  cerebral infarction ([place]) 09/05/2007  . ESOPHAGITIS 09/05/2007  . GERD 09/05/2007  . EROSIVE GASTRITIS 09/05/2007  . UPPER GASTROINTESTINAL HEMORRHAGE 09/05/2007  . DYSPHAGIA UNSPECIFIED 09/05/2007  . INTERNAL HEMORRHOIDS 06/10/2003  . GASTRIC POLYP 12/03/2001  . HIATAL HERNIA 12/03/2001  . COLONIC POLYPS 09/25/1992  . GASTRIC ULCER 09/22/1992    Past Surgical History:  Procedure Laterality Date  . ASD REPAIR  1983  . CARDIOVERSION     possibly 3 times, dr. brody/cooper  . CHOLECYSTECTOMY  1990s  . ENTEROSCOPY  05/13/2012   Procedure: ENTEROSCOPY;  Surgeon: Inda Castle, MD;  Location: [facility] ENDOSCOPY;  Service: Endoscopy;  Laterality: N/A;  . [place]  . LEFT HEART CATHETERIZATION WITH CORONARY ANGIOGRAM N/A 09/06/2014   Procedure: LEFT HEART CATHETERIZATION WITH CORONARY ANGIOGRAM;  Surgeon: Jettie Booze, MD;  Location: [facility] CATH LAB;  Service: Cardiovascular;  Laterality: N/A;  . partial small bowel resection  2005   ischemic?        Home Medications    Prior to Admission medications   Medication Sig Start Date End Date Taking? Authorizing Provider  buPROPion (WELLBUTRIN XL) 150 MG 24 hr tablet Take 1 tablet (150 mg total) by mouth daily. 09/01/17   Burchette, Alinda Sierras, MD  clotrimazole-betamethasone (LOTRISONE) cream APPLY TO AFFECTED AREAS TWICE A DAY Patient taking differently: APPLY TO AFFECTED AREAS as needed 06/26/16   Burchette, Alinda Sierras, MD  dutasteride (AVODART) 0.5 MG capsule TAKE (1) CAPSULE DAILY 11/26/17   Burchette, Alinda Sierras, MD  furosemide (LASIX) 20 MG tablet Take 1 tablet (20 mg total) by mouth as needed. For edema 04/22/16   Almyra Deforest, PA  HYDROcodone-acetaminophen (NORCO/VICODIN) 5-325 MG tablet Take 1 tablet by mouth every 4 (four) hours as needed. 12/28/17   Isla Pence, MD  ipratropium (ATROVENT) 0.02 % nebulizer solution NEBULIZE 1 VAIL 4 TIMES A DAY AS DIRECTED Patient taking differently: NEBULIZE 1 VAIL  A DAY AS needed 07/07/17    Burchette, Alinda Sierras, MD  isosorbide mononitrate (IMDUR) 30 MG 24 hr tablet Take 1 tablet (30 mg total) by mouth daily. 08/04/17   Allred, Jeneen Rinks, MD  levalbuterol (XOPENEX) 0.63 MG/3ML nebulizer solution NEBULIZE 1 VIAL (WITH IPRATROPIUM) TWICE A DAY AS NEEDED. MAY USE EXTR A EVERY 4 HOURS IF NEEDED 07/04/17   Burchette, Alinda Sierras, MD  mirtazapine (REMERON) 15 MG tablet TAKE 1 TABLET DAILY Patient taking differently: TAKE 1 TABLET in the evening 08/15/17   Burchette, Alinda Sierras, MD  mupirocin ointment (BACTROBAN) 2 % Apply 1 application topically 2 (two) times daily.    [provider]  nitroGLYCERIN (NITROSTAT) 0.4 MG SL tablet Place 1 tablet (0.4 mg total) under the tongue every 5 (five) minutes x 3 doses as needed for chest pain. 08/20/14   Theora Gianotti, NP  pantoprazole (PROTONIX) 40 MG tablet Take 1 tablet (40 mg total) by mouth daily. 12/11/17   Mauri Pole, MD  potassium chloride (K-DUR) 10 MEQ tablet TAKE (  2) TABLETS TWICE DAILY. 02/14/17   Burchette, Alinda Sierras, MD  potassium chloride (K-DUR) 10 MEQ tablet TAKE (2) TABLETS TWICE DAILY. 10/20/17   Burchette, Alinda Sierras, MD  potassium chloride (K-DUR) 10 MEQ tablet TAKE (2) TABLETS TWICE DAILY. 10/22/17   Burchette, Alinda Sierras, MD  potassium chloride (K-DUR) 10 MEQ tablet TAKE (2) TABLETS TWICE DAILY. 12/19/17   Burchette, Alinda Sierras, MD  potassium chloride (K-DUR) 10 MEQ tablet TAKE (2) TABLETS TWICE DAILY. 12/26/17   Burchette, Alinda Sierras, MD  rivastigmine (EXELON) 4.6 mg/24hr PLACE 1 PATCH ONTO SKIN DAILY AS DIRECTED 08/20/17   Burchette, Alinda Sierras, MD  saccharomyces boulardii (FLORASTOR) 250 MG capsule Take 250 mg by mouth 2 (two) times daily.    [provider]  simvastatin (ZOCOR) 20 MG tablet TAKE ONE TABLET AT BEDTIME 10/22/17   Burchette, Alinda Sierras, MD  vitamin B-12 (CYANOCOBALAMIN) 500 MCG tablet Take 500 mcg by mouth daily.    [provider]  XARELTO 20 MG TABS tablet TAKE 1 TABLET DAILY WITH SUPPER 11/28/17   Burchette,  Alinda Sierras, MD    Family History Family History  Problem Relation Age of Onset  . Heart disease Mother   . Heart disease Father   . Prostate cancer Brother     Social History Social History   Tobacco Use  . Smoking status: Former Smoker    Packs/day: 0.00    Years: 0.00    Pack years: 0.00    Types: Cigarettes    Last attempt to quit: 06/13/1983    Years since quitting: 34.5  . Smokeless tobacco: Never Used  Substance Use Topics  . Alcohol use: No  . Drug use: No     Allergies   Lovenox [enoxaparin sodium]; Tape; and Penicillins   Review of Systems Review of Systems  Respiratory: Positive for shortness of breath.   Musculoskeletal: Positive for back pain.  All other systems reviewed and are negative.    Physical Exam Updated Vital Signs BP (!) 147/94   Pulse 81   Temp 98.1 F (36.7 C) (Oral)   Resp (!) 22   SpO2 91%   Physical Exam  Constitutional: He is oriented to person, place, and time. He appears well-developed and well-nourished.  HENT:  Head: Normocephalic and atraumatic.  Right Ear: External ear normal.  Left Ear: External ear normal.  Nose: Nose normal.  Mouth/Throat: Oropharynx is clear and moist.  Eyes: Pupils are equal, round, and reactive to light. Conjunctivae and EOM are normal.  Neck: Normal range of motion. Neck supple.  Cardiovascular: Normal heart sounds and intact distal pulses. An irregularly irregular rhythm present.  Pulmonary/Chest: Tachypnea noted. He has wheezes.      Abdominal: Soft. Bowel sounds are normal.  Musculoskeletal: Normal range of motion.       Lumbar back: He exhibits tenderness.  Neurological: He is alert and oriented to person, place, and time.  Skin: Skin is warm. Capillary refill takes less than 2 seconds.  Psychiatric: He has a normal mood and affect. His behavior is normal. Judgment and thought content normal.  Nursing note and vitals reviewed.    ED Treatments / Results  Labs (all labs ordered are  listed, but only abnormal results are displayed) Labs Reviewed  COMPREHENSIVE METABOLIC PANEL - Abnormal; Notable for the following components:      Result Value   Glucose, Bld 113 (*)    Total Bilirubin 1.5 (*)    All other components within normal limits  CBC WITH  DIFFERENTIAL/PLATELET - Abnormal; Notable for the following components:   Platelets 141 (*)    All other components within normal limits  URINALYSIS, ROUTINE W REFLEX MICROSCOPIC    EKG None  Radiology Dg Ribs Unilateral W/chest Right  Result Date: 12/28/2017 CLINICAL DATA:  Pain. EXAM: RIGHT RIBS AND CHEST - 3+ VIEW COMPARISON:  Chest x-ray September 29, 2017 FINDINGS: There is chronic opacity in left retrocardiac region. A previous CT scan demonstrated a large hiatal hernia and adjacent atelectasis in this region. The right pulmonary artery is prominent. Left pulmonary artery is obscured by patient rotation. No pneumothorax. No other change within the chest. Subtle step-off in the anterior right eighth and ninth ribs. No other evidence of fracture. IMPRESSION: 1. Subtle irregularity of the anterior right eighth and ninth ribs. While not certain, these irregularity could represent very subtle fractures. No pneumothorax. 2. Enlargement of the right pulmonary artery is unchanged, suggesting pulmonary arterial hypertension. 3. There is opacity in left retrocardiac region. There has been chronic opacity in this area consistent with a hiatal hernia and adjacent atelectasis. The finding is slightly more prominent today. The change could be due to technique, increased atelectasis adjacent to the hiatal hernia, or less likely acute on chronic infiltrate. Electronically Signed   By: Dorise Bullion III M.D   On: 12/28/2017 15:59   Dg Lumbar Spine Complete  Result Date: 12/28/2017 CLINICAL DATA:  Fall with lower back pain.  Initial encounter. EXAM: LUMBAR SPINE - COMPLETE 4+ VIEW COMPARISON:  03/21/2017 FINDINGS: L2 superior endplate and  anterior body fracture with mild height loss (less than 50%). No evident retropulsion. Lower lumbar facet degeneration with L4-5 anterolisthesis. Spondylosis and disc narrowing with mild dextroscoliosis Osteopenia. IMPRESSION: Acute appearing L2 body fracture with mild height loss. Electronically Signed   By: Monte Fantasia M.D.   On: 12/28/2017 15:58    Procedures Procedures (including critical care time)  Medications Ordered in ED Medications  ipratropium-albuterol (DUONEB) 0.5-2.5 (3) MG/3ML nebulizer solution 3 mL (3 mLs Nebulization Given 12/28/17 1405)  sodium chloride 0.9 % bolus 1,000 mL (500 mLs Intravenous New Bag/Given 12/28/17 1458)     Initial Impression / Assessment and Plan / ED Course  I have reviewed the triage vital signs and the nursing notes.  Pertinent labs & imaging results that were available during my care of the patient were reviewed by me and considered in my medical decision making (see chart for details).    Pt has refused pain meds while here.  Duoneb helped his breathing.  Pt does have 2 rib fractures and a L2 compression fx.  Pt offered admission, but refused.  He and his wife know they can come back at any time.  Pt given incentive spirometer for home prior to d/c.  The pt has refused home pt and home health by pcp in the past and does not want it now.  Final Clinical Impressions(s) / ED Diagnoses   Final diagnoses:  Closed fracture of multiple ribs of right side, initial encounter  Closed compression fracture of second lumbar vertebra, initial encounter [facility])    ED Discharge Orders        Ordered    HYDROcodone-acetaminophen (NORCO/VICODIN) 5-325 MG tablet  Every 4 hours PRN     12/28/17 1616       Isla Pence, MD 12/28/17 1621

## 2017-12-28 NOTE — ED Notes (Signed)
REspirations improved after treatment . Much less labored and calmer.

## 2017-12-28 NOTE — ED Notes (Signed)
Pt is unable to give urine sample. Incontinence care was provided. Urinal is at bedside. Pt aware that urine sample is needed. Will try again later.

## 2017-12-28 NOTE — ED Notes (Signed)
Pt denines pain but answer changes and his respionse is not clear.

## 2017-12-29 ENCOUNTER — Telehealth: Payer: Self-pay | Admitting: Family Medicine

## 2017-12-29 NOTE — Telephone Encounter (Signed)
Left message on machine returning wife's call.  CRM created

## 2017-12-29 NOTE — Telephone Encounter (Signed)
Copied from [place] [phone]. Topic: General - Other >> Dec 29, 2017 12:25 PM Mcneil, Ja-Kwan wrote: Reason for CRM: Pt wife states she needs to speak with Dr. Erick Blinks nurse. Mrs Kissoon did not go in to detail but requested that the nurse calls her back. Cb# [phone]

## 2017-12-31 ENCOUNTER — Observation Stay (HOSPITAL_COMMUNITY)
Admission: EM | Admit: 2017-12-31 | Discharge: 2018-01-02 | Disposition: A | Discharge status: Home / Self Care | Payer: Medicare Other | Attending: Internal Medicine | Admitting: Internal Medicine

## 2017-12-31 ENCOUNTER — Encounter (HOSPITAL_COMMUNITY): Payer: Self-pay | Admitting: Emergency Medicine

## 2017-12-31 ENCOUNTER — Emergency Department (HOSPITAL_COMMUNITY): Payer: Medicare Other

## 2017-12-31 ENCOUNTER — Other Ambulatory Visit: Payer: Self-pay

## 2017-12-31 DIAGNOSIS — I5042 Chronic combined systolic (congestive) and diastolic (congestive) heart failure: Secondary | ICD-10-CM | POA: Diagnosis not present

## 2017-12-31 DIAGNOSIS — R296 Repeated falls: Secondary | ICD-10-CM

## 2017-12-31 DIAGNOSIS — Y92009 Unspecified place in unspecified non-institutional (private) residence as the place of occurrence of the external cause: Secondary | ICD-10-CM

## 2017-12-31 DIAGNOSIS — S32591A Other specified fracture of right pubis, initial encounter for closed fracture: Secondary | ICD-10-CM | POA: Diagnosis not present

## 2017-12-31 DIAGNOSIS — J449 Chronic obstructive pulmonary disease, unspecified: Secondary | ICD-10-CM | POA: Insufficient documentation

## 2017-12-31 DIAGNOSIS — N183 Chronic kidney disease, stage 3 (moderate): Secondary | ICD-10-CM | POA: Insufficient documentation

## 2017-12-31 DIAGNOSIS — I2699 Other pulmonary embolism without acute cor pulmonale: Secondary | ICD-10-CM | POA: Diagnosis not present

## 2017-12-31 DIAGNOSIS — S32029D Unspecified fracture of second lumbar vertebra, subsequent encounter for fracture with routine healing: Secondary | ICD-10-CM

## 2017-12-31 DIAGNOSIS — F0390 Unspecified dementia without behavioral disturbance: Secondary | ICD-10-CM | POA: Insufficient documentation

## 2017-12-31 DIAGNOSIS — I130 Hypertensive heart and chronic kidney disease with heart failure and stage 1 through stage 4 chronic kidney disease, or unspecified chronic kidney disease: Secondary | ICD-10-CM | POA: Insufficient documentation

## 2017-12-31 DIAGNOSIS — I4891 Unspecified atrial fibrillation: Secondary | ICD-10-CM | POA: Diagnosis not present

## 2017-12-31 DIAGNOSIS — Z87891 Personal history of nicotine dependence: Secondary | ICD-10-CM | POA: Diagnosis not present

## 2017-12-31 DIAGNOSIS — Z515 Encounter for palliative care: Secondary | ICD-10-CM

## 2017-12-31 DIAGNOSIS — S2241XA Multiple fractures of ribs, right side, initial encounter for closed fracture: Secondary | ICD-10-CM

## 2017-12-31 DIAGNOSIS — Y939 Activity, unspecified: Secondary | ICD-10-CM | POA: Diagnosis not present

## 2017-12-31 DIAGNOSIS — W19XXXA Unspecified fall, initial encounter: Secondary | ICD-10-CM | POA: Diagnosis not present

## 2017-12-31 DIAGNOSIS — S50311A Abrasion of right elbow, initial encounter: Secondary | ICD-10-CM | POA: Diagnosis not present

## 2017-12-31 DIAGNOSIS — I482 Chronic atrial fibrillation: Secondary | ICD-10-CM | POA: Diagnosis not present

## 2017-12-31 DIAGNOSIS — Z79899 Other long term (current) drug therapy: Secondary | ICD-10-CM | POA: Diagnosis not present

## 2017-12-31 DIAGNOSIS — I10 Essential (primary) hypertension: Secondary | ICD-10-CM | POA: Diagnosis present

## 2017-12-31 DIAGNOSIS — Y999 Unspecified external cause status: Secondary | ICD-10-CM | POA: Insufficient documentation

## 2017-12-31 DIAGNOSIS — S3993XA Unspecified injury of pelvis, initial encounter: Secondary | ICD-10-CM | POA: Diagnosis present

## 2017-12-31 HISTORY — DX: Repeated falls: R29.6

## 2017-12-31 LAB — URINALYSIS, ROUTINE W REFLEX MICROSCOPIC
Bacteria, UA: NONE SEEN
Bilirubin Urine: NEGATIVE
GLUCOSE, UA: NEGATIVE mg/dL
Ketones, ur: 20 mg/dL — AB
Leukocytes, UA: NEGATIVE
Nitrite: NEGATIVE
PH: 5 (ref 5.0–8.0)
PROTEIN: NEGATIVE mg/dL
Specific Gravity, Urine: 1.02 (ref 1.005–1.030)

## 2017-12-31 LAB — BASIC METABOLIC PANEL
ANION GAP: 9 (ref 5–15)
BUN: 22 mg/dL (ref 8–23)
CO2: 26 mmol/L (ref 22–32)
Calcium: 9.2 mg/dL (ref 8.9–10.3)
Chloride: 105 mmol/L (ref 98–111)
Creatinine, Ser: 0.96 mg/dL (ref 0.61–1.24)
GLUCOSE: 110 mg/dL — AB (ref 70–99)
POTASSIUM: 4.4 mmol/L (ref 3.5–5.1)
Sodium: 140 mmol/L (ref 135–145)

## 2017-12-31 LAB — CBC WITH DIFFERENTIAL/PLATELET
BASOS ABS: 0 10*3/uL (ref 0.0–0.1)
Basophils Relative: 0 %
Eosinophils Absolute: 0 10*3/uL (ref 0.0–0.7)
Eosinophils Relative: 1 %
HEMATOCRIT: 45.9 % (ref 39.0–52.0)
HEMOGLOBIN: 15.4 g/dL (ref 13.0–17.0)
Lymphocytes Relative: 6 %
Lymphs Abs: 0.5 10*3/uL — ABNORMAL LOW (ref 0.7–4.0)
MCH: 31 pg (ref 26.0–34.0)
MCHC: 33.6 g/dL (ref 30.0–36.0)
MCV: 92.4 fL (ref 78.0–100.0)
MONOS PCT: 11 %
Monocytes Absolute: 0.8 10*3/uL (ref 0.1–1.0)
NEUTROS ABS: 6.5 10*3/uL (ref 1.7–7.7)
NEUTROS PCT: 82 %
Platelets: 139 10*3/uL — ABNORMAL LOW (ref 150–400)
RBC: 4.97 MIL/uL (ref 4.22–5.81)
RDW: 13.6 % (ref 11.5–15.5)
WBC: 7.9 10*3/uL (ref 4.0–10.5)

## 2017-12-31 LAB — TROPONIN I: Troponin I: 0.03 ng/mL (ref <0.03)

## 2017-12-31 MED ORDER — SIMVASTATIN 20 MG PO TABS
20.0000 mg | ORAL_TABLET | Freq: Every day | ORAL | Status: DC
  Administered 2017-12-31 – 2018-01-01 (×2): 20 mg via ORAL
  Filled 2017-12-31 (×2): qty 1

## 2017-12-31 MED ORDER — IPRATROPIUM BROMIDE 0.02 % IN SOLN: 0.2500 mg | Freq: Four times a day (QID) | RESPIRATORY_TRACT | Status: DC | PRN

## 2017-12-31 MED ORDER — BUPROPION HCL ER (XL) 150 MG PO TB24
150.0000 mg | ORAL_TABLET | Freq: Every day | ORAL | Status: DC
  Administered 2018-01-01 – 2018-01-02 (×2): 150 mg via ORAL
  Filled 2017-12-31 (×2): qty 1

## 2017-12-31 MED ORDER — PANTOPRAZOLE SODIUM 40 MG PO TBEC
40.0000 mg | DELAYED_RELEASE_TABLET | Freq: Every day | ORAL | Status: DC
  Administered 2018-01-01 – 2018-01-02 (×2): 40 mg via ORAL
  Filled 2017-12-31 (×2): qty 1

## 2017-12-31 MED ORDER — MORPHINE SULFATE (PF) 2 MG/ML IV SOLN
2.0000 mg | INTRAVENOUS | Status: DC | PRN
  Administered 2018-01-02: 2 mg via INTRAVENOUS
  Filled 2017-12-31: qty 1

## 2017-12-31 MED ORDER — DUTASTERIDE 0.5 MG PO CAPS
0.5000 mg | ORAL_CAPSULE | Freq: Every day | ORAL | Status: DC
  Administered 2018-01-01 – 2018-01-02 (×2): 0.5 mg via ORAL
  Filled 2017-12-31 (×5): qty 1

## 2017-12-31 MED ORDER — LABETALOL HCL 5 MG/ML IV SOLN
5.0000 mg | Freq: Once | INTRAVENOUS | Status: AC
  Administered 2017-12-31: 5 mg via INTRAVENOUS
  Filled 2017-12-31: qty 4

## 2017-12-31 MED ORDER — MORPHINE SULFATE (PF) 2 MG/ML IV SOLN
2.0000 mg | INTRAVENOUS | Status: DC | PRN
  Administered 2017-12-31: 2 mg via INTRAVENOUS
  Filled 2017-12-31: qty 1

## 2017-12-31 MED ORDER — ONDANSETRON HCL 4 MG/2ML IJ SOLN
4.0000 mg | Freq: Four times a day (QID) | INTRAMUSCULAR | Status: DC | PRN
Start: 2017-12-31 — End: 2018-01-02

## 2017-12-31 MED ORDER — MIRTAZAPINE 15 MG PO TABS
15.0000 mg | ORAL_TABLET | Freq: Every evening | ORAL | Status: DC
  Administered 2017-12-31 – 2018-01-02 (×3): 15 mg via ORAL
  Filled 2017-12-31 (×3): qty 1

## 2017-12-31 MED ORDER — SACCHAROMYCES BOULARDII 250 MG PO CAPS
250.0000 mg | ORAL_CAPSULE | Freq: Two times a day (BID) | ORAL | Status: DC
  Administered 2017-12-31 – 2018-01-02 (×4): 250 mg via ORAL
  Filled 2017-12-31 (×4): qty 1

## 2017-12-31 MED ORDER — RIVAROXABAN 20 MG PO TABS
20.0000 mg | ORAL_TABLET | Freq: Every day | ORAL | Status: DC
  Administered 2017-12-31 – 2018-01-02 (×3): 20 mg via ORAL
  Filled 2017-12-31 (×3): qty 1

## 2017-12-31 MED ORDER — ACETAMINOPHEN 325 MG PO TABS
650.0000 mg | ORAL_TABLET | Freq: Four times a day (QID) | ORAL | Status: DC | PRN
  Administered 2018-01-01 – 2018-01-02 (×2): 650 mg via ORAL
  Filled 2017-12-31 (×2): qty 2

## 2017-12-31 MED ORDER — RIVASTIGMINE 4.6 MG/24HR TD PT24
4.6000 mg | MEDICATED_PATCH | Freq: Every day | TRANSDERMAL | Status: DC
  Administered 2018-01-01 – 2018-01-02 (×2): 4.6 mg via TRANSDERMAL
  Filled 2017-12-31 (×5): qty 1

## 2017-12-31 MED ORDER — ONDANSETRON HCL 4 MG PO TABS: 4.0000 mg | ORAL_TABLET | Freq: Four times a day (QID) | ORAL | Status: DC | PRN

## 2017-12-31 MED ORDER — ACETAMINOPHEN 650 MG RE SUPP: 650.0000 mg | Freq: Four times a day (QID) | RECTAL | Status: DC | PRN

## 2017-12-31 MED ORDER — ALBUTEROL SULFATE (2.5 MG/3ML) 0.083% IN NEBU
2.5000 mg | INHALATION_SOLUTION | Freq: Four times a day (QID) | RESPIRATORY_TRACT | Status: DC | PRN
  Filled 2017-12-31: qty 0.5

## 2017-12-31 MED ORDER — ISOSORBIDE MONONITRATE ER 60 MG PO TB24
30.0000 mg | ORAL_TABLET | Freq: Every day | ORAL | Status: DC
  Administered 2018-01-01 – 2018-01-02 (×2): 30 mg via ORAL
  Filled 2017-12-31 (×2): qty 1

## 2017-12-31 NOTE — ED Notes (Signed)
Per Dr. Thurnell Garbe acceptable to give patient the heart healthy meal we have.

## 2017-12-31 NOTE — ED Notes (Signed)
CRITICAL VALUE ALERT  Critical Value:  Troponin 00.3  Date & Time Notied:  7/31 19 1805  Provider Notified: dr Thurnell Garbe   Orders Received/Actions taken:

## 2017-12-31 NOTE — ED Notes (Signed)
Pt assessed by EDP prior to nursing assessment

## 2017-12-31 NOTE — ED Triage Notes (Signed)
PT brought in by [facility] from home for fall that happened last night. EMS was called to his residence and helped him back to bed but pt refused to come to hospital at that time but pain increased today in his lower back and come to hospital. PT was seen and hx with L2 compression fx to back on 12/28/17 from fall. Family reports concern to EMS they can't care for him at home with this new injury. EMS gave 60mcg of fentanyl IV prior to ED arrival.

## 2017-12-31 NOTE — ED Provider Notes (Signed)
[facility] EMERGENCY DEPARTMENT Provider Note   CSN: [id number] Arrival date & time: 12/31/17  1535     History   Chief Complaint Chief Complaint  Patient presents with  . Fall    HPI [name] is a 84 y.o. male.   Fall     Pt was seen at 1555. Per EMS, pt's family and pt report: c/o gradual onset and worsening of persistent low back "pain" since he fell again last night. Pt was seen in the ED 3 days ago for fall, dx LS fx and rib fx. Pt had refused admission at that time. Pt's family states pt continues to fall and c/o pain, requesting admission now. Pt apparently should be walking with his walked but "doesn't always." Denies CP/SOB, no abd pain, no N/V/D, no focal motor weakness.   Past Medical History:  Diagnosis Date  . Anemia   . ASD (atrial septal defect)    a. s/p repair 1983.  [name] BPH (benign prostatic hyperplasia)   . Cerebrovascular accident ([place]) [place]  . Chronic bronchitis ([place])    a. h/o resp failure in setting of bronchitis vs PNA 05/2012.  [name] Chronic diarrhea   . COPD (chronic obstructive pulmonary disease) ([place])   . Coronary artery disease    a. Nonobstructive by cath 2009 and 09/06/14  . Depression   . Dysphagia, unspecified(787.20)   . Erosive gastritis    H/o GIB felt secondary to this  . Esophagitis   . Falls frequently   . Gastric polyp   . Gastric ulcer   . GERD (gastroesophageal reflux disease)   . Hearing impairment   . Hiatal hernia   . History of upper gastrointestinal hemorrhage    dr. Maurene Capes, GI  . Hyperlipidemia   . Hypertension   . Internal hemorrhoids   . LV dysfunction    a. EF 45-50% by echo 05/2012.  [name] Permanent atrial fibrillation (HCC)    INR followed at [facility].  [name] Vision problems     Patient Active Problem List   Diagnosis Date Noted  . Palliative care by specialist   . Advance care planning   . Goals of care, counseling/discussion   . Dementia 09/30/2017  . Chest pain 09/29/2017  . Elevated PSA 02/02/2016  .  Pulmonary emboli ([place]) 01/11/2016  . Actinic keratoses 11/16/2015  . Memory loss 11/16/2015  . Atypical chest pain 01/28/2015  . Right carotid bruit 09/26/2014  . Abnormal stress test   . Chronic atrial fibrillation ([place]) 09/04/2014  . On warfarin therapy 09/04/2014  . Encounter for therapeutic drug monitoring 07/26/2013  . Bradycardia 02/17/2013  . Acute on chronic systolic CHF (congestive heart failure) ([place]) 11/16/2012  . Acute on chronic renal failure ([place]) 11/16/2012  . Failure to thrive in adult 10/30/2012  . Anemia of chronic disease 10/13/2012  . Toxic encephalopathy secondary to UTI 10/12/2012  . Depression 10/12/2012  . Moderate protein-calorie malnutrition ([place]) 10/12/2012  . Stage III chronic kidney disease ([place]) 10/12/2012  . CHF (congestive heart failure) ([place]) 09/06/2012  . Chronic diastolic (congestive) heart failure ([place]) 07/23/2012  . Hypokalemia 07/20/2012  . Hypomagnesemia 07/20/2012  . Hypocalcemia 07/20/2012  . Acute kidney injury ([place]) 07/20/2012  . Chronic diarrhea 07/20/2012  . Falls frequently 07/20/2012  . Leukocytosis 07/20/2012  . Normocytic anemia 07/20/2012  . Chronic respiratory failure ([place]) 05/23/2012  . COPD exacerbation ([place]) 05/22/2012  . Gastritis with hemorrhage 05/13/2012  . Stricture and stenosis of esophagus 05/13/2012  . Acute posthemorrhagic anemia 05/12/2012  .  Atrial fibrillation with rapid ventricular response ([place]) 05/06/2012  . Dysphagia 05/06/2012  . HTN (hypertension) 05/06/2012  . H/O: CVA (cerebrovascular accident) 05/06/2012  . GERD (gastroesophageal reflux disease) 05/06/2012  . Purulent bronchitis ([place]) 05/06/2012  . SOB (shortness of breath) 03/19/2010  . ABDOMINAL DISTENSION 03/19/2010  . FATIGUE 11/09/2009  . B12 DEFICIENCY 07/26/2009  . Hyperglycemia 07/26/2009  . Hyperlipidemia 07/20/2009  . Hereditary and idiopathic peripheral neuropathy 07/20/2009  . BENIGN PROSTATIC HYPERTROPHY, HX OF 07/20/2009  .  Essential hypertension 09/05/2007  . Coronary atherosclerosis 09/05/2007  . FIBRILLATION, ATRIAL 09/05/2007  . Cerebral artery occlusion with cerebral infarction ([place]) 09/05/2007  . ESOPHAGITIS 09/05/2007  . GERD 09/05/2007  . EROSIVE GASTRITIS 09/05/2007  . UPPER GASTROINTESTINAL HEMORRHAGE 09/05/2007  . DYSPHAGIA UNSPECIFIED 09/05/2007  . INTERNAL HEMORRHOIDS 06/10/2003  . GASTRIC POLYP 12/03/2001  . HIATAL HERNIA 12/03/2001  . COLONIC POLYPS 09/25/1992  . GASTRIC ULCER 09/22/1992    Past Surgical History:  Procedure Laterality Date  . ASD REPAIR  1983  . CARDIOVERSION     possibly 3 times, dr. brody/cooper  . CHOLECYSTECTOMY  1990s  . ENTEROSCOPY  05/13/2012   Procedure: ENTEROSCOPY;  Surgeon: Inda Castle, MD;  Location: [facility] ENDOSCOPY;  Service: Endoscopy;  Laterality: N/A;  . [place]  . LEFT HEART CATHETERIZATION WITH CORONARY ANGIOGRAM N/A 09/06/2014   Procedure: LEFT HEART CATHETERIZATION WITH CORONARY ANGIOGRAM;  Surgeon: Jettie Booze, MD;  Location: [facility] CATH LAB;  Service: Cardiovascular;  Laterality: N/A;  . partial small bowel resection  2005   ischemic?        Home Medications    Prior to Admission medications   Medication Sig Start Date End Date Taking? Authorizing Provider  buPROPion (WELLBUTRIN XL) 150 MG 24 hr tablet Take 1 tablet (150 mg total) by mouth daily. 09/01/17  Yes Burchette, Alinda Sierras, MD  clotrimazole-betamethasone (LOTRISONE) cream APPLY TO AFFECTED AREAS TWICE A DAY Patient taking differently: APPLY TO AFFECTED AREAS as needed 06/26/16  Yes Burchette, Alinda Sierras, MD  dutasteride (AVODART) 0.5 MG capsule TAKE (1) CAPSULE DAILY 11/26/17  Yes Burchette, Alinda Sierras, MD  furosemide (LASIX) 20 MG tablet Take 1 tablet (20 mg total) by mouth as needed. For edema 04/22/16  Yes [place], [place], [place]  HYDROcodone-acetaminophen (NORCO/VICODIN) 5-325 MG tablet Take 1 tablet by mouth every 4 (four) hours as needed. 12/28/17  Yes Isla Pence, MD    ipratropium (ATROVENT) 0.02 % nebulizer solution NEBULIZE 1 VAIL 4 TIMES A DAY AS DIRECTED Patient taking differently: NEBULIZE 1 VAIL  A DAY AS needed 07/07/17  Yes Burchette, Alinda Sierras, MD  isosorbide mononitrate (IMDUR) 30 MG 24 hr tablet Take 1 tablet (30 mg total) by mouth daily. 08/04/17  Yes Allred, Jeneen Rinks, MD  levalbuterol (XOPENEX) 0.63 MG/3ML nebulizer solution NEBULIZE 1 VIAL (WITH IPRATROPIUM) TWICE A DAY AS NEEDED. MAY USE EXTR A EVERY 4 HOURS IF NEEDED 07/04/17  Yes Burchette, Alinda Sierras, MD  mirtazapine (REMERON) 15 MG tablet TAKE 1 TABLET DAILY Patient taking differently: TAKE 1 TABLET in the evening 08/15/17  Yes Burchette, Alinda Sierras, MD  mupirocin ointment (BACTROBAN) 2 % Apply 1 application topically 2 (two) times daily.   Yes [provider]  nitroGLYCERIN (NITROSTAT) 0.4 MG SL tablet Place 1 tablet (0.4 mg total) under the tongue every 5 (five) minutes x 3 doses as needed for chest pain. 08/20/14  Yes Theora Gianotti, NP  pantoprazole (PROTONIX) 40 MG tablet Take 1 tablet (40 mg total) by mouth  daily. 12/11/17  Yes Nandigam, Venia Minks, MD  potassium chloride (K-DUR) 10 MEQ tablet TAKE (2) TABLETS TWICE DAILY. 02/14/17  Yes Burchette, Alinda Sierras, MD  rivastigmine (EXELON) 4.6 mg/24hr PLACE 1 PATCH ONTO SKIN DAILY AS DIRECTED 08/20/17  Yes Burchette, Alinda Sierras, MD  saccharomyces boulardii (FLORASTOR) 250 MG capsule Take 250 mg by mouth 2 (two) times daily.   Yes [provider]  simvastatin (ZOCOR) 20 MG tablet TAKE ONE TABLET AT BEDTIME 10/22/17  Yes Burchette, Alinda Sierras, MD  vitamin B-12 (CYANOCOBALAMIN) 500 MCG tablet Take 500 mcg by mouth daily.   Yes [provider]  XARELTO 20 MG TABS tablet TAKE 1 TABLET DAILY WITH SUPPER 11/28/17  Yes Burchette, Alinda Sierras, MD  potassium chloride (K-DUR) 10 MEQ tablet TAKE (2) TABLETS TWICE DAILY. Patient not taking: Reported on 12/31/2017 10/20/17   [name], MD  potassium chloride (K-DUR) 10 MEQ tablet TAKE (2) TABLETS  TWICE DAILY. Patient not taking: Reported on 12/31/2017 10/22/17   [name], MD  potassium chloride (K-DUR) 10 MEQ tablet TAKE (2) TABLETS TWICE DAILY. Patient not taking: Reported on 12/31/2017 12/19/17   [name], MD  potassium chloride (K-DUR) 10 MEQ tablet TAKE (2) TABLETS TWICE DAILY. Patient not taking: Reported on 12/31/2017 12/26/17   [name], MD    Family History Family History  Problem Relation Age of Onset  . Heart disease Mother   . Heart disease Father   . Prostate cancer Brother     Social History Social History   Tobacco Use  . Smoking status: Former Smoker    Packs/day: 0.00    Years: 0.00    Pack years: 0.00    Types: Cigarettes    Last attempt to quit: 06/13/1983    Years since quitting: 34.5  . Smokeless tobacco: Never Used  Substance Use Topics  . Alcohol use: No  . Drug use: No     Allergies   Lovenox [enoxaparin sodium]; Tape; and Penicillins   Review of Systems Review of Systems ROS: Statement: All systems negative except as marked or noted in the HPI; Constitutional: Negative for fever and chills. ; ; Eyes: Negative for eye pain, redness and discharge. ; ; ENMT: Negative for ear pain, hoarseness, nasal congestion, sinus pressure and sore throat. ; ; Cardiovascular: Negative for chest pain, palpitations, diaphoresis, dyspnea and peripheral edema. ; ; Respiratory: Negative for cough, wheezing and stridor. ; ; Gastrointestinal: Negative for nausea, vomiting, diarrhea, abdominal pain, blood in stool, hematemesis, jaundice and rectal bleeding. . ; ; Genitourinary: Negative for dysuria, flank pain and hematuria. ; ; Musculoskeletal: +LBP. Negative for neck pain. Negative for swelling and deformity.; ; Skin: +abrasion. Negative for pruritus, rash, blisters, bruising and skin lesion.; ; Neuro: Negative for headache, lightheadedness and neck stiffness. Negative for weakness, altered level of consciousness, altered mental status, extremity  weakness, paresthesias, involuntary movement, seizure and syncope.       Physical Exam Updated Vital Signs BP (!) 175/102   Pulse (!) 109   Temp 98 F (36.7 C) (Oral)   Resp 20   Ht 5\' 11"  (1.803 m)   Wt 66.7 kg (147 lb)   SpO2 96%   BMI 20.50 kg/m    BP (!) 169/94   Pulse (!) 105   Temp 98 F (36.7 C) (Oral)   Resp (!) 24   Ht 5\' 11"  (1.803 m)   Wt 66.7 kg (147 lb)   SpO2 97%   BMI 20.50 kg/m  Physical Exam 1600: Physical examination:  Nursing notes reviewed; Vital signs and O2 SAT reviewed;  Constitutional: Well developed, Well nourished, In no acute distress; Head:  Normocephalic, atraumatic; Eyes: EOMI, PERRL, No scleral icterus; ENMT: Mouth and pharynx normal, Mucous membranes dry; Neck: Supple, Full range of motion, No lymphadenopathy; Cardiovascular: Irregular rate and rhythm, No gallop; Respiratory: Breath sounds clear & equal bilaterally, No wheezes.  Speaking full sentences with ease, Normal respiratory effort/excursion; Chest: Nontender, Movement normal; Abdomen: Soft, Nontender, Nondistended, Normal bowel sounds; Genitourinary: No CVA tenderness; Spine:  No midline CS, TS, tenderness. +TTP LS and bilat lumbar paraspinal muscles.;;  Extremities: Peripheral pulses normal, No tenderness, Pelvis stable. +superficial abrasion right elbow, NT to palp. No deformity. No edema, No calf edema or asymmetry.; Neuro: AA&Ox3, tangential historian. Major CN grossly intact.  Speech clear. No facial droop. Moves all extremities spontaneously and to command without apparent gross focal motor deficits.; Skin: Color normal, Warm, Dry.   ED Treatments / Results  Labs (all labs ordered are listed, but only abnormal results are displayed)   EKG EKG Interpretation  Date/Time:  Wednesday December 31 2017 17:10:15 EDT Ventricular Rate:  107 PR Interval:    QRS Duration: 128 QT Interval:  351 QTC Calculation: 469 R Axis:   102 Text Interpretation:  Atrial fibrillation RBBB and LPFB  When compared with ECG of 12/28/2017 No significant change was found Confirmed by Francine Graven [id number]) on 12/31/2017 5:30:59 PM   Radiology   Procedures Procedures (including critical care time)  Medications Ordered in ED Medications - No data to display   Initial Impression / Assessment and Plan / ED Course  I have reviewed the triage vital signs and the nursing notes.  Pertinent labs & imaging results that were available during my care of the patient were reviewed by me and considered in my medical decision making (see chart for details).  MDM Reviewed: previous chart, nursing note and vitals Reviewed previous: labs and ECG Interpretation: labs, ECG, x-ray and CT scan    Results for orders placed or performed during the hospital encounter of 12/31/17  Urinalysis, Routine w reflex microscopic  Result Value Ref Range   Color, Urine YELLOW YELLOW   APPearance CLEAR CLEAR   Specific Gravity, Urine 1.020 1.005 - 1.030   pH 5.0 5.0 - 8.0   Glucose, UA NEGATIVE NEGATIVE mg/dL   Hgb urine dipstick SMALL (A) NEGATIVE   Bilirubin Urine NEGATIVE NEGATIVE   Ketones, ur 20 (A) NEGATIVE mg/dL   Protein, ur NEGATIVE NEGATIVE mg/dL   Nitrite NEGATIVE NEGATIVE   Leukocytes, UA NEGATIVE NEGATIVE   RBC / HPF 0-5 0 - 5 RBC/hpf   WBC, UA 0-5 0 - 5 WBC/hpf   Bacteria, UA NONE SEEN NONE SEEN   Squamous Epithelial / LPF 0-5 0 - 5   Mucus PRESENT   Basic metabolic panel  Result Value Ref Range   Sodium 140 135 - 145 mmol/L   Potassium 4.4 3.5 - 5.1 mmol/L   Chloride 105 98 - 111 mmol/L   CO2 26 22 - 32 mmol/L   Glucose, Bld 110 (H) 70 - 99 mg/dL   BUN 22 8 - 23 mg/dL   Creatinine, Ser 0.96 0.61 - 1.24 mg/dL   Calcium 9.2 8.9 - 10.3 mg/dL   GFR calc non Af Amer >60 >60 mL/min   GFR calc Af Amer >60 >60 mL/min   Anion gap 9 5 - 15  Troponin I  Result Value Ref Range   Troponin  I 0.03 (HH) <0.03 ng/mL  CBC with Differential  Result Value Ref Range   WBC 7.9 4.0 - 10.5 K/uL    RBC 4.97 4.22 - 5.81 MIL/uL   Hemoglobin 15.4 13.0 - 17.0 g/dL   HCT 45.9 39.0 - 52.0 %   MCV 92.4 78.0 - 100.0 fL   MCH 31.0 26.0 - 34.0 pg   MCHC 33.6 30.0 - 36.0 g/dL   RDW 13.6 11.5 - 15.5 %   Platelets 139 (L) 150 - 400 K/uL   Neutrophils Relative % 82 %   Neutro Abs 6.5 1.7 - 7.7 K/uL   Lymphocytes Relative 6 %   Lymphs Abs 0.5 (L) 0.7 - 4.0 K/uL   Monocytes Relative 11 %   Monocytes Absolute 0.8 0.1 - 1.0 K/uL   Eosinophils Relative 1 %   Eosinophils Absolute 0.0 0.0 - 0.7 K/uL   Basophils Relative 0 %   Basophils Absolute 0.0 0.0 - 0.1 K/uL   Dg Ribs Unilateral W/chest Right Result Date: 12/31/2017 CLINICAL DATA:  Recent fall with back and rib pain, initial encounter EXAM: RIGHT RIBS AND CHEST - 3+ VIEW COMPARISON:  12/28/2017 FINDINGS: Cardiac shadow is stable. Prominence of the pulmonary vasculature is noted centrally stable from the prior exam consistent with pulmonary arterial hypertension. Stable irregularity of the right anterior eighth and ninth ribs are seen. No displaced fractures are identified. No pneumothorax is noted. Stable retrocardiac density is noted on the left. IMPRESSION: Changes in the right eighth and ninth rib anteriorly likely related to prior trauma with healing. No definitive acute fractures are noted. Electronically Signed   By: Inez Catalina M.D.   On: 12/31/2017 17:25   Dg Lumbar Spine Complete Result Date: 12/31/2017 CLINICAL DATA:  84 y/o M; lower back pain, right rib pain, bilateral hip pain. Fall last night. EXAM: LUMBAR SPINE - COMPLETE 4+ VIEW COMPARISON:  12/28/2017 lumbar spine radiograph. FINDINGS: Stable moderate lumbar spine dextrocurvature with apex at L2. Normal lumbar lordosis without listhesis. Stable mild L2 superior endplate compression deformity. Stable mild loss of height of the L3-4 and L5-S1 intervertebral disc spaces. Stable lower lumbar facet arthrosis. Bones are demineralized. Calcific atherosclerosis of the abdominal aorta.  IMPRESSION: 1. Stable L2 mild compression deformity. 2. No new acute fracture or dislocation identified. Electronically Signed   By: Kristine Garbe M.D.   On: 12/31/2017 17:22   Ct Head Wo Contrast Result Date: 12/31/2017 CLINICAL DATA:  Fall EXAM: CT HEAD WITHOUT CONTRAST CT CERVICAL SPINE WITHOUT CONTRAST TECHNIQUE: Multidetector CT imaging of the head and cervical spine was performed following the standard protocol without intravenous contrast. Multiplanar CT image reconstructions of the cervical spine were also generated. COMPARISON:  CT 10/21/2012 FINDINGS: CT HEAD FINDINGS Brain: No acute territorial infarction, hemorrhage or intracranial mass. Moderate atrophy with moderate severe small vessel ischemic changes of the white matter. Chronic left parietooccipital infarct. Stable prominent ventricles. Vascular: No hyperdense vessels.  Carotid vascular calcification Skull: Normal. Negative for fracture or focal lesion. Sinuses/Orbits: No acute finding. Other: None CT CERVICAL SPINE FINDINGS Alignment: Straightening of the cervical spine. No subluxation. Facet alignment within normal limits. Skull base and vertebrae: No acute fracture. No primary bone lesion or focal pathologic process. Soft tissues and spinal canal: No prevertebral fluid or swelling. No visible canal hematoma. Disc levels: Fusion at C5-C6. Moderate degenerative changes C3-C4, C4-C5 and C6-C7. Multiple level facet degenerative changes. Multiple level bilateral foraminal stenosis, most marked at C5-C6. Upper chest: Emphysema at the apices. Carotid vascular calcification Other:  None IMPRESSION: 1. No CT evidence for acute intracranial abnormality. Atrophy and small vessel ischemic changes of the white matter. Small chronic infarct in the left parietooccipital region. 2. Straightening of the cervical spine with degenerative changes. No acute osseous abnormality. 3. Apical emphysema. Electronically Signed   By: Donavan Foil M.D.   On:  12/31/2017 16:48   Ct Cervical Spine Wo Contrast Result Date: 12/31/2017 CLINICAL DATA:  Fall EXAM: CT HEAD WITHOUT CONTRAST CT CERVICAL SPINE WITHOUT CONTRAST TECHNIQUE: Multidetector CT imaging of the head and cervical spine was performed following the standard protocol without intravenous contrast. Multiplanar CT image reconstructions of the cervical spine were also generated. COMPARISON:  CT 10/21/2012 FINDINGS: CT HEAD FINDINGS Brain: No acute territorial infarction, hemorrhage or intracranial mass. Moderate atrophy with moderate severe small vessel ischemic changes of the white matter. Chronic left parietooccipital infarct. Stable prominent ventricles. Vascular: No hyperdense vessels.  Carotid vascular calcification Skull: Normal. Negative for fracture or focal lesion. Sinuses/Orbits: No acute finding. Other: None CT CERVICAL SPINE FINDINGS Alignment: Straightening of the cervical spine. No subluxation. Facet alignment within normal limits. Skull base and vertebrae: No acute fracture. No primary bone lesion or focal pathologic process. Soft tissues and spinal canal: No prevertebral fluid or swelling. No visible canal hematoma. Disc levels: Fusion at C5-C6. Moderate degenerative changes C3-C4, C4-C5 and C6-C7. Multiple level facet degenerative changes. Multiple level bilateral foraminal stenosis, most marked at C5-C6. Upper chest: Emphysema at the apices. Carotid vascular calcification Other: None IMPRESSION: 1. No CT evidence for acute intracranial abnormality. Atrophy and small vessel ischemic changes of the white matter. Small chronic infarct in the left parietooccipital region. 2. Straightening of the cervical spine with degenerative changes. No acute osseous abnormality. 3. Apical emphysema. Electronically Signed   By: Donavan Foil M.D.   On: 12/31/2017 16:48   Dg Hips Bilat W Or Wo Pelvis 3-4 Views Result Date: 12/31/2017 CLINICAL DATA:  Recent fall with right hip pain, initial encounter EXAM: DG  HIP (WITH OR WITHOUT PELVIS) 3-4V BILAT COMPARISON:  03/21/2017 FINDINGS: There is mild irregularity in the right inferior pubic ramus suspicious for undisplaced fracture. No other fractures are seen. Degenerative changes of the hip joints are noted bilaterally. No soft tissue abnormality is seen. IMPRESSION: Changes suspicious for right inferior pubic ramus fracture. Electronically Signed   By: Inez Catalina M.D.   On: 12/31/2017 17:31    1830:  Pt unable to sit or stand d/t pain, likely d/t new pubic ramus fracture. Previous fx are stable. T/C returned from [facility] Brick, case discussed, including:  HPI, pertinent PM/SHx, VS/PE, dx testing, ED course and treatment:  Agreeable to admit.    Final Clinical Impressions(s) / ED Diagnoses   Final diagnoses:  None    ED Discharge Orders    None       Francine Graven, DO 01/04/18 [id number]

## 2017-12-31 NOTE — ED Notes (Signed)
Pt at CT at this time.

## 2017-12-31 NOTE — H&P (Signed)
History and Physical    DONAVYN [name] SWN:[id number] DOB: [birth year] DOA: 12/31/2017  PCP: [name], MD   Patient coming from: Home  Chief Complaint: Fall  HPI: CHISTIAN [name] is a 84 y.o. male with medical history significant for Atria fib, HTN, COPD chronic respiratory failure, dementia, frequent falls, who was brought to the ED via EMS with complaints of a fall that happened last night.  EMS was called in last night also but patient refused to come to the hospital.  But with increasing lower back pain patient presented to the ED today.  Patient lives at home with spouse who can no longer care for him.  Patient sustained a fall- witnessed last night when got up, was holding onto his walker, spouse was trying to get his wheelchair to him in a tight space, when trying to maneuver patient's legs could not hold him up resulting in the fall.   Patient denies chest pain, no shortness of breath, has maintained good p.o. Intake.  Denies dizziness.  Compliant with Eliquis.  At baseline patient's understand simple questions and can make simple conversations, most evenings patient becomes confused per spouse.  [facility] 12[facility]/17, after fall, imaging revealed-acute L2 fracture, fractures of the eighth and ninth ribs.  Patient declined admission at that time.  ED Course: Tachycardic to 111, tachypnea- 30, elevated blood pressure systolic to 938H.  UA-ketones small hemoglobin.  Troponin 0.03, creatinine at baseline 0.96.  Head and cervical CT negative for acute abnormality.  Lumbar spine x-ray-stable L2 mild compression deformity.  3 view chest x-ray-changes in the right eighth and ninth rib anteriorly likely related to prior trauma with healing.  Pelvic x-ray-changes suspicious for right inferior pubic ramus fracture.   Review of Systems: As per HPI all other systems reviewed and negative.   Past Medical History:  Diagnosis Date  . Anemia   . ASD (atrial septal defect)     a. s/p repair 1983.  [name] BPH (benign prostatic hyperplasia)   . Cerebrovascular accident ([place]) [place]  . Chronic bronchitis ([place])    a. h/o resp failure in setting of bronchitis vs PNA 05/2012.  [name] Chronic diarrhea   . COPD (chronic obstructive pulmonary disease) ([place])   . Coronary artery disease    a. Nonobstructive by cath 2009 and 09/06/14  . Depression   . Dysphagia, unspecified(787.20)   . Erosive gastritis    H/o GIB felt secondary to this  . Esophagitis   . Falls frequently   . Gastric polyp   . Gastric ulcer   . GERD (gastroesophageal reflux disease)   . Hearing impairment   . Hiatal hernia   . History of upper gastrointestinal hemorrhage    dr. Maurene Capes, GI  . Hyperlipidemia   . Hypertension   . Internal hemorrhoids   . LV dysfunction    a. EF 45-50% by echo 05/2012.  [name] Permanent atrial fibrillation (HCC)    [facility]  [name] Vision problems     Past Surgical History:  Procedure Laterality Date  . ASD REPAIR  1983  . CARDIOVERSION     possibly 3 times, dr. brody/cooper  . CHOLECYSTECTOMY  1990s  . ENTEROSCOPY  05/13/2012   Procedure: ENTEROSCOPY;  Surgeon: Inda Castle, MD;  Location: [facility] ENDOSCOPY;  Service: Endoscopy;  Laterality: N/A;  . [place]  . LEFT HEART CATHETERIZATION WITH CORONARY ANGIOGRAM N/A 09/06/2014   Procedure: LEFT HEART CATHETERIZATION WITH CORONARY ANGIOGRAM;  Surgeon: Jettie Booze, MD;  [facility]: [facility] CATH LAB;  Service: Cardiovascular;  Laterality: N/A;  . partial small bowel resection  2005   ischemic?     reports that he quit smoking about 34 years ago. His smoking use included cigarettes. He smoked 0.00 packs per day for 0.00 years. He has never used smokeless tobacco. He reports that he does not drink alcohol or use drugs.  Allergies  Allergen Reactions  . Lovenox [Enoxaparin Sodium] Swelling  . Tape Other (See Comments)    SKIN WILL TEAR IF TAPED (MUST BE LIGHT & BRIEF OR USE COBAN WRAP, PLEASE!!)  .  Penicillins Hives    Has patient had a PCN reaction causing immediate rash, facial/tongue/throat swelling, SOB or lightheadedness with hypotension: Yes Has patient had a PCN reaction causing severe rash involving mucus membranes or skin necrosis: No Has patient had a PCN reaction that required hospitalization: Unknown Has patient had a PCN reaction occurring within the last 10 years: No If all of the above answers are "NO", then may proceed with Cephalosporin use.     Family History  Problem Relation Age of Onset  . Heart disease Mother   . Heart disease Father   . Prostate cancer Brother     Prior to Admission medications   Medication Sig Start Date End Date Taking? Authorizing Provider  buPROPion (WELLBUTRIN XL) 150 MG 24 hr tablet Take 1 tablet (150 mg total) by mouth daily. 09/01/17  Yes Burchette, Alinda Sierras, MD  clotrimazole-betamethasone (LOTRISONE) cream APPLY TO AFFECTED AREAS TWICE A DAY Patient taking differently: APPLY TO AFFECTED AREAS as needed 06/26/16  Yes Burchette, Alinda Sierras, MD  dutasteride (AVODART) 0.5 MG capsule TAKE (1) CAPSULE DAILY 11/26/17  Yes Burchette, Alinda Sierras, MD  furosemide (LASIX) 20 MG tablet Take 1 tablet (20 mg total) by mouth as needed. For edema 04/22/16  Yes [place], Kim, [place]  HYDROcodone-acetaminophen (NORCO/VICODIN) 5-325 MG tablet Take 1 tablet by mouth every 4 (four) hours as needed. 12/28/17  Yes Isla Pence, MD  ipratropium (ATROVENT) 0.02 % nebulizer solution NEBULIZE 1 VAIL 4 TIMES A DAY AS DIRECTED Patient taking differently: NEBULIZE 1 VAIL  A DAY AS needed 07/07/17  Yes Burchette, Alinda Sierras, MD  isosorbide mononitrate (IMDUR) 30 MG 24 hr tablet Take 1 tablet (30 mg total) by mouth daily. 08/04/17  Yes Allred, Jeneen Rinks, MD  levalbuterol (XOPENEX) 0.63 MG/3ML nebulizer solution NEBULIZE 1 VIAL (WITH IPRATROPIUM) TWICE A DAY AS NEEDED. MAY USE EXTR A EVERY 4 HOURS IF NEEDED 07/04/17  Yes Burchette, Alinda Sierras, MD  mirtazapine (REMERON) 15 MG tablet TAKE 1 TABLET  DAILY Patient taking differently: TAKE 1 TABLET in the evening 08/15/17  Yes Burchette, Alinda Sierras, MD  mupirocin ointment (BACTROBAN) 2 % Apply 1 application topically 2 (two) times daily.   Yes [provider]  nitroGLYCERIN (NITROSTAT) 0.4 MG SL tablet Place 1 tablet (0.4 mg total) under the tongue every 5 (five) minutes x 3 doses as needed for chest pain. 08/20/14  Yes Theora Gianotti, NP  pantoprazole (PROTONIX) 40 MG tablet Take 1 tablet (40 mg total) by mouth daily. 12/11/17  Yes Nandigam, Venia Minks, MD  potassium chloride (K-DUR) 10 MEQ tablet TAKE (2) TABLETS TWICE DAILY. 02/14/17  Yes Burchette, Alinda Sierras, MD  rivastigmine (EXELON) 4.6 mg/24hr PLACE 1 PATCH ONTO SKIN DAILY AS DIRECTED 08/20/17  Yes Burchette, Alinda Sierras, MD  saccharomyces boulardii (FLORASTOR) 250 MG capsule Take 250 mg by mouth 2 (two) times daily.  Yes [provider]  simvastatin (ZOCOR) 20 MG tablet TAKE ONE TABLET AT BEDTIME 10/22/17  Yes Burchette, Alinda Sierras, MD  vitamin B-12 (CYANOCOBALAMIN) 500 MCG tablet Take 500 mcg by mouth daily.   Yes [provider]  XARELTO 20 MG TABS tablet TAKE 1 TABLET DAILY WITH SUPPER 11/28/17  Yes Burchette, Alinda Sierras, MD  potassium chloride (K-DUR) 10 MEQ tablet TAKE (2) TABLETS TWICE DAILY. Patient not taking: Reported on 12/31/2017 10/20/17   [name], MD  potassium chloride (K-DUR) 10 MEQ tablet TAKE (2) TABLETS TWICE DAILY. Patient not taking: Reported on 12/31/2017 10/22/17   [name], MD  potassium chloride (K-DUR) 10 MEQ tablet TAKE (2) TABLETS TWICE DAILY. Patient not taking: Reported on 12/31/2017 12/19/17   [name], MD  potassium chloride (K-DUR) 10 MEQ tablet TAKE (2) TABLETS TWICE DAILY. Patient not taking: Reported on 12/31/2017 12/26/17   [name], MD    Physical Exam: Vitals:   12/31/17 1600 12/31/17 1730 12/31/17 1745 12/31/17 1830  BP: (!) 175/102 (!) 190/126  (!) 169/94  Pulse: (!) 109 (!) 105 (!) 111     Resp:  17 (!) 30 (!) 24  Temp:      TempSrc:      SpO2: 96% 97% 97%   Weight:      Height:        Constitutional: calm, comfortable, frail Vitals:   12/31/17 1600 12/31/17 1730 12/31/17 1745 12/31/17 1830  BP: (!) 175/102 (!) 190/126  (!) 169/94  Pulse: (!) 109 (!) 105 (!) 111   Resp:  17 (!) 30 (!) 24  Temp:      TempSrc:      SpO2: 96% 97% 97%   Weight:      Height:       Eyes: PERRL, lids and conjunctivae normal ENMT: Mucous membranes are dry. Posterior pharynx clear of any exudate or lesions. Neck: normal, supple, no masses, no thyromegaly Respiratory: clear to auscultation bilaterally, no wheezing, no crackles. Normal respiratory effort. No accessory muscle use.  Cardiovascular: Mild tachycardia, irregular rate and rhythm, no murmurs / rubs / gallops. No extremity edema. 2+ pedal pulses.  Abdomen: no tenderness, no masses palpated. No hepatosplenomegaly. Bowel sounds positive.  Musculoskeletal: no clubbing / cyanosis. No joint deformity upper and lower extremities. Good ROM, no contractures. Normal muscle tone.  Skin: Senile purpura upper extremities, no ulcers. No induration Neurologic: CN 2-12 grossly intact. Strength 4/5 in all 4.  Psychiatric: Alert and oriented to person, recognizes spouse but not to place or time.  Cannot tell me who is president. Normal mood.   Labs on Admission: I have personally reviewed following labs and imaging studies  CBC: Recent Labs  Lab 12/28/17 1437 12/31/17 1704  WBC 8.8 7.9  NEUTROABS 7.3 6.5  HGB 15.4 15.4  HCT 47.6 45.9  MCV 93.3 92.4  PLT 141* 193*   Basic Metabolic Panel: Recent Labs  Lab 12/28/17 1437 12/31/17 1704  NA 142 140  K 3.8 4.4  CL 106 105  CO2 26 26  GLUCOSE 113* 110*  BUN 13 22  CREATININE 1.00 0.96  CALCIUM 9.3 9.2   GFR: Estimated Creatinine Clearance: 55 mL/min (by C-G formula based on SCr of 0.96 mg/dL). Liver Function Tests: Recent Labs  Lab 12/28/17 1437  AST 24  ALT 19  ALKPHOS 62   BILITOT 1.5*  PROT 6.8  ALBUMIN 3.9   Cardiac Enzymes: Recent Labs  Lab 12/31/17 1704  TROPONINI 0.03*  Urine analysis:    Component Value Date/Time   COLORURINE YELLOW 12/31/2017 [place] 12/31/2017 1606   LABSPEC 1.020 12/31/2017 1606   PHURINE 5.0 12/31/2017 1606   GLUCOSEU NEGATIVE 12/31/2017 1606   HGBUR SMALL (A) 12/31/2017 1606   BILIRUBINUR NEGATIVE 12/31/2017 1606   BILIRUBINUR 1+ 05/11/2015 1420   KETONESUR 20 (A) 12/31/2017 1606   PROTEINUR NEGATIVE 12/31/2017 1606   UROBILINOGEN 0.2 05/11/2015 1420   UROBILINOGEN 0.2 11/16/2012 1237   NITRITE NEGATIVE 12/31/2017 1606   LEUKOCYTESUR NEGATIVE 12/31/2017 1606    Radiological Exams on Admission: Dg Ribs Unilateral W/chest Right  Result Date: 12/31/2017 CLINICAL DATA:  Recent fall with back and rib pain, initial encounter EXAM: RIGHT RIBS AND CHEST - 3+ VIEW COMPARISON:  12/28/2017 FINDINGS: Cardiac shadow is stable. Prominence of the pulmonary vasculature is noted centrally stable from the prior exam consistent with pulmonary arterial hypertension. Stable irregularity of the right anterior eighth and ninth ribs are seen. No displaced fractures are identified. No pneumothorax is noted. Stable retrocardiac density is noted on the left. IMPRESSION: Changes in the right eighth and ninth rib anteriorly likely related to prior trauma with healing. No definitive acute fractures are noted. Electronically Signed   By: Inez Catalina M.D.   On: 12/31/2017 17:25   Dg Lumbar Spine Complete  Result Date: 12/31/2017 CLINICAL DATA:  84 y/o M; lower back pain, right rib pain, bilateral hip pain. Fall last night. EXAM: LUMBAR SPINE - COMPLETE 4+ VIEW COMPARISON:  12/28/2017 lumbar spine radiograph. FINDINGS: Stable moderate lumbar spine dextrocurvature with apex at L2. Normal lumbar lordosis without listhesis. Stable mild L2 superior endplate compression deformity. Stable mild loss of height of the L3-4 and L5-S1  intervertebral disc spaces. Stable lower lumbar facet arthrosis. Bones are demineralized. Calcific atherosclerosis of the abdominal aorta. IMPRESSION: 1. Stable L2 mild compression deformity. 2. No new acute fracture or dislocation identified. Electronically Signed   By: Kristine Garbe M.D.   On: 12/31/2017 17:22   Ct Head Wo Contrast  Result Date: 12/31/2017 CLINICAL DATA:  Fall EXAM: CT HEAD WITHOUT CONTRAST CT CERVICAL SPINE WITHOUT CONTRAST TECHNIQUE: Multidetector CT imaging of the head and cervical spine was performed following the standard protocol without intravenous contrast. Multiplanar CT image reconstructions of the cervical spine were also generated. COMPARISON:  CT 10/21/2012 FINDINGS: CT HEAD FINDINGS Brain: No acute territorial infarction, hemorrhage or intracranial mass. Moderate atrophy with moderate severe small vessel ischemic changes of the white matter. Chronic left parietooccipital infarct. Stable prominent ventricles. Vascular: No hyperdense vessels.  Carotid vascular calcification Skull: Normal. Negative for fracture or focal lesion. Sinuses/Orbits: No acute finding. Other: None CT CERVICAL SPINE FINDINGS Alignment: Straightening of the cervical spine. No subluxation. Facet alignment within normal limits. Skull base and vertebrae: No acute fracture. No primary bone lesion or focal pathologic process. Soft tissues and spinal canal: No prevertebral fluid or swelling. No visible canal hematoma. Disc levels: Fusion at C5-C6. Moderate degenerative changes C3-C4, C4-C5 and C6-C7. Multiple level facet degenerative changes. Multiple level bilateral foraminal stenosis, most marked at C5-C6. Upper chest: Emphysema at the apices. Carotid vascular calcification Other: None IMPRESSION: 1. No CT evidence for acute intracranial abnormality. Atrophy and small vessel ischemic changes of the white matter. Small chronic infarct in the left parietooccipital region. 2. Straightening of the cervical  spine with degenerative changes. No acute osseous abnormality. 3. Apical emphysema. Electronically Signed   By: Darrell Foil M.D.   On: 12/31/2017 16:48   Ct Cervical  Spine Wo Contrast  Result Date: 12/31/2017 CLINICAL DATA:  Fall EXAM: CT HEAD WITHOUT CONTRAST CT CERVICAL SPINE WITHOUT CONTRAST TECHNIQUE: Multidetector CT imaging of the head and cervical spine was performed following the standard protocol without intravenous contrast. Multiplanar CT image reconstructions of the cervical spine were also generated. COMPARISON:  CT 10/21/2012 FINDINGS: CT HEAD FINDINGS Brain: No acute territorial infarction, hemorrhage or intracranial mass. Moderate atrophy with moderate severe small vessel ischemic changes of the white matter. Chronic left parietooccipital infarct. Stable prominent ventricles. Vascular: No hyperdense vessels.  Carotid vascular calcification Skull: Normal. Negative for fracture or focal lesion. Sinuses/Orbits: No acute finding. Other: None CT CERVICAL SPINE FINDINGS Alignment: Straightening of the cervical spine. No subluxation. Facet alignment within normal limits. Skull base and vertebrae: No acute fracture. No primary bone lesion or focal pathologic process. Soft tissues and spinal canal: No prevertebral fluid or swelling. No visible canal hematoma. Disc levels: Fusion at C5-C6. Moderate degenerative changes C3-C4, C4-C5 and C6-C7. Multiple level facet degenerative changes. Multiple level bilateral foraminal stenosis, most marked at C5-C6. Upper chest: Emphysema at the apices. Carotid vascular calcification Other: None IMPRESSION: 1. No CT evidence for acute intracranial abnormality. Atrophy and small vessel ischemic changes of the white matter. Small chronic infarct in the left parietooccipital region. 2. Straightening of the cervical spine with degenerative changes. No acute osseous abnormality. 3. Apical emphysema. Electronically Signed   By: Darrell Foil M.D.   On: 12/31/2017 16:48   Dg  Hips Bilat W Or Wo Pelvis 3-4 Views  Result Date: 12/31/2017 CLINICAL DATA:  Recent fall with right hip pain, initial encounter EXAM: DG HIP (WITH OR WITHOUT PELVIS) 3-4V BILAT COMPARISON:  03/21/2017 FINDINGS: There is mild irregularity in the right inferior pubic ramus suspicious for undisplaced fracture. No other fractures are seen. Degenerative changes of the hip joints are noted bilaterally. No soft tissue abnormality is seen. IMPRESSION: Changes suspicious for right inferior pubic ramus fracture. Electronically Signed   By: Inez Catalina M.D.   On: 12/31/2017 17:31    EKG: Independently reviewed.  Atrial fibrillation rate 107. Old RBBB, old LPFB.  Assessment/Plan Active Problems:   Essential hypertension   FIBRILLATION, ATRIAL   Falls frequently   Pulmonary emboli (HCC)   Dementia   Palliative care by specialist    Frequent falls-appears mechanical 2/2 increasing frailty.  Multiple falls past few days.  On anticoagulation, Troponin 0.03, but chronically elevated. EKG- RBBB, LPFB- both old.  New fractures today-right inferior pubic ramus fracture, fractures from 2 days ago-mild L2 deformity, seventh and eighth rib fractures.  UA chest x-ray not suggestive of infection.  - Incentive spirometry - Pain control - Ortho consult a.m- order placed. -Patient will need placement, PT and social work consult  Atrial fibrillation, pulmonary embolism- HR rates 105- 111.  ? Increased rate secondary to pain, on anticoagulation with Xarelto.  Not on rate limiting medications. Acute PE 2017 on CTA-with right heart strain.  -Spoke extensively to spouse about risk and benefits of anticoagulation considering increasing frequency of falls, but also risk for PE considering immobility. At this time spouse has opted to continue anticoagulation.  -Palliative care consult, for goals of care discussion  Dementia-with history of sundowning.  - continue home rivastigmine patch, Wellbutrin,  mirtazapine  HTN-elevated, systolic 188C to 166A? Secondary to pain.  Blood pressure and heart rates usually better controlled. -Continue Imdur -IV labetalol 5 mg x 1.  BPH -Continue dutasteride   DVT prophylaxis:  SCDs Code Status: DNR-confirmed with patient  and spouse at bedside Family Communication: Spouse at bedside Disposition Plan: The rounding team Consults called: Orthopedics Admission status: inpatient, tele   Bethena Roys MD [facility] Pager [phone] From 3PM-11PM.  Otherwise please contact night-coverage [email or web address] Password [facility]  12/31/2017, 6:53 PM

## 2017-12-31 NOTE — ED Notes (Signed)
Advised Dr. Thurnell Garbe unable to complete orthostatic vital signs.  Patient is unable to sit without pain.  Patient will not stand due to pain.

## 2018-01-01 DIAGNOSIS — F0390 Unspecified dementia without behavioral disturbance: Secondary | ICD-10-CM

## 2018-01-01 DIAGNOSIS — I482 Chronic atrial fibrillation: Secondary | ICD-10-CM | POA: Diagnosis not present

## 2018-01-01 DIAGNOSIS — I10 Essential (primary) hypertension: Secondary | ICD-10-CM | POA: Diagnosis not present

## 2018-01-01 LAB — URINE CULTURE: Culture: NO GROWTH

## 2018-01-01 MED ORDER — HYDRALAZINE HCL 20 MG/ML IJ SOLN
10.0000 mg | INTRAMUSCULAR | Status: DC | PRN
  Administered 2018-01-01: 10 mg via INTRAVENOUS
  Filled 2018-01-01: qty 1

## 2018-01-01 NOTE — Progress Notes (Signed)
PROGRESS NOTE    [name]  OJJ:[id number] DOB: Oct 17, 1932 DOA: 12/31/2017 PCP: [name], MD   Brief Narrative:   [name] is a 84 y.o. male with medical history significant for Atria fib, HTN, COPD chronic respiratory failure, dementia, frequent falls, who was brought to the ED via EMS with complaints of a fall that happened last night.    Patient was noted to have a pubic rami fracture on the right side and is noted to have prior L2 compression deformity as well as right eighth and ninth rib fractures from previous falls.  He has been admitted with increased frailty and his spouse can no longer care for him at home.  Orthopedic and PT evaluation currently pending.   Assessment & Plan:   Active Problems:   Essential hypertension   FIBRILLATION, ATRIAL   Falls frequently   Pulmonary emboli (HCC)   Dementia   Palliative care by specialist   Frequent falls   1. Frequent falls secondary to increased frailty.  Patient has had prior fractures with new pubic ramus fracture noted.  Continue on pain control with orthopedic and PT evaluation pending.  Clinical social worker working on placement as he appears to be unsafe to go back home. 2. Atrial fibrillation with prior PE.  Continue anticoagulation with Xarelto per family recommendations although this is not indicated in the face of frequent falls.  We will discuss this further with spouse today.  No further need for telemetry at this time as rate has remained stable. 3. Dementia with history of sundowning.  Continue on Rivastigmine patch, Wellbutrin, and mirtazapine. 4. Hypertension-elevated.  Continue Imdur with hydralazine as needed. 5. BPH.  Continue dutasteride.   DVT prophylaxis:SCDs Code Status: DNR Family Communication: Will call spouse Disposition Plan: PT evaluation as well as Orthopedics pending; discussed with CSW regarding need for placement.   Consultants:   Orthopedics  Procedures:    None  Antimicrobials:   None   Subjective: Patient seen and evaluated today with no new acute complaints or concerns. He appears to be resting and in no acute distress. He can only give minimal history and responses secondary to his dementia.  Objective: Vitals:   12/31/17 2000 12/31/17 2047 12/31/17 2242 01/01/18 0631  BP: (!) 174/103 (!) 187/109 (!) 171/96 (!) 148/100  Pulse:  (!) 111 (!) 105 93  Resp: (!) 28  20   Temp:  97.7 F (36.5 C)  97.7 F (36.5 C)  TempSrc:  Oral  Oral  SpO2:  97% 94% 96%  Weight:      Height:        Intake/Output Summary (Last 24 hours) at 01/01/2018 1038 Last data filed at 01/01/2018 0900 Gross per 24 hour  Intake 240 ml  Output 300 ml  Net -60 ml   Filed Weights   12/31/17 1549  Weight: 66.7 kg (147 lb)    Examination:  General exam: Appears calm and comfortable  Respiratory system: Clear to auscultation. Respiratory effort normal. Cardiovascular system: S1 & S2 heard, RRR. No JVD, murmurs, rubs, gallops or clicks. No pedal edema. Gastrointestinal system: Abdomen is nondistended, soft and nontender. No organomegaly or masses felt. Normal bowel sounds heard. Central nervous system: Cannot be properly assessed given patient condition Extremities: No edema noted, but motor strength cannot be properly assessed given patient condition. Skin: No rashes, lesions or ulcers    Data Reviewed: I have personally reviewed following labs and imaging studies  CBC: Recent Labs  Lab 12/28/17  1437 12/31/17 1704  WBC 8.8 7.9  NEUTROABS 7.3 6.5  HGB 15.4 15.4  HCT 47.6 45.9  MCV 93.3 92.4  PLT 141* 161*   Basic Metabolic Panel: Recent Labs  Lab 12/28/17 1437 12/31/17 1704  NA 142 140  K 3.8 4.4  CL 106 105  CO2 26 26  GLUCOSE 113* 110*  BUN 13 22  CREATININE 1.00 0.96  CALCIUM 9.3 9.2   GFR: Estimated Creatinine Clearance: 55 mL/min (by C-G formula based on SCr of 0.96 mg/dL). Liver Function Tests: Recent Labs  Lab  12/28/17 1437  AST 24  ALT 19  ALKPHOS 62  BILITOT 1.5*  PROT 6.8  ALBUMIN 3.9   No results for input(s): LIPASE, AMYLASE in the last 168 hours. No results for input(s): AMMONIA in the last 168 hours. Coagulation Profile: No results for input(s): INR, PROTIME in the last 168 hours. Cardiac Enzymes: Recent Labs  Lab 12/31/17 1704  TROPONINI 0.03*   BNP (last 3 results) No results for input(s): PROBNP in the last 8760 hours. HbA1C: No results for input(s): HGBA1C in the last 72 hours. CBG: No results for input(s): GLUCAP in the last 168 hours. Lipid Profile: No results for input(s): CHOL, HDL, LDLCALC, TRIG, CHOLHDL, LDLDIRECT in the last 72 hours. Thyroid Function Tests: No results for input(s): TSH, T4TOTAL, FREET4, T3FREE, THYROIDAB in the last 72 hours. Anemia Panel: No results for input(s): VITAMINB12, FOLATE, FERRITIN, TIBC, IRON, RETICCTPCT in the last 72 hours. Sepsis Labs: No results for input(s): PROCALCITON, LATICACIDVEN in the last 168 hours.  No results found for this or any previous visit (from the past 240 hour(s)).       Radiology Studies: Dg Ribs Unilateral W/chest Right  Result Date: 12/31/2017 CLINICAL DATA:  Recent fall with back and rib pain, initial encounter EXAM: RIGHT RIBS AND CHEST - 3+ VIEW COMPARISON:  12/28/2017 FINDINGS: Cardiac shadow is stable. Prominence of the pulmonary vasculature is noted centrally stable from the prior exam consistent with pulmonary arterial hypertension. Stable irregularity of the right anterior eighth and ninth ribs are seen. No displaced fractures are identified. No pneumothorax is noted. Stable retrocardiac density is noted on the left. IMPRESSION: Changes in the right eighth and ninth rib anteriorly likely related to prior trauma with healing. No definitive acute fractures are noted. Electronically Signed   By: Inez Catalina M.D.   On: 12/31/2017 17:25   Dg Lumbar Spine Complete  Result Date: 12/31/2017 CLINICAL  DATA:  84 y/o M; lower back pain, right rib pain, bilateral hip pain. Fall last night. EXAM: LUMBAR SPINE - COMPLETE 4+ VIEW COMPARISON:  12/28/2017 lumbar spine radiograph. FINDINGS: Stable moderate lumbar spine dextrocurvature with apex at L2. Normal lumbar lordosis without listhesis. Stable mild L2 superior endplate compression deformity. Stable mild loss of height of the L3-4 and L5-S1 intervertebral disc spaces. Stable lower lumbar facet arthrosis. Bones are demineralized. Calcific atherosclerosis of the abdominal aorta. IMPRESSION: 1. Stable L2 mild compression deformity. 2. No new acute fracture or dislocation identified. Electronically Signed   By: Kristine Garbe M.D.   On: 12/31/2017 17:22   Ct Head Wo Contrast  Result Date: 12/31/2017 CLINICAL DATA:  Fall EXAM: CT HEAD WITHOUT CONTRAST CT CERVICAL SPINE WITHOUT CONTRAST TECHNIQUE: Multidetector CT imaging of the head and cervical spine was performed following the standard protocol without intravenous contrast. Multiplanar CT image reconstructions of the cervical spine were also generated. COMPARISON:  CT 10/21/2012 FINDINGS: CT HEAD FINDINGS Brain: No acute territorial infarction, hemorrhage or intracranial  mass. Moderate atrophy with moderate severe small vessel ischemic changes of the white matter. Chronic left parietooccipital infarct. Stable prominent ventricles. Vascular: No hyperdense vessels.  Carotid vascular calcification Skull: Normal. Negative for fracture or focal lesion. Sinuses/Orbits: No acute finding. Other: None CT CERVICAL SPINE FINDINGS Alignment: Straightening of the cervical spine. No subluxation. Facet alignment within normal limits. Skull base and vertebrae: No acute fracture. No primary bone lesion or focal pathologic process. Soft tissues and spinal canal: No prevertebral fluid or swelling. No visible canal hematoma. Disc levels: Fusion at C5-C6. Moderate degenerative changes C3-C4, C4-C5 and C6-C7. Multiple level  facet degenerative changes. Multiple level bilateral foraminal stenosis, most marked at C5-C6. Upper chest: Emphysema at the apices. Carotid vascular calcification Other: None IMPRESSION: 1. No CT evidence for acute intracranial abnormality. Atrophy and small vessel ischemic changes of the white matter. Small chronic infarct in the left parietooccipital region. 2. Straightening of the cervical spine with degenerative changes. No acute osseous abnormality. 3. Apical emphysema. Electronically Signed   By: Donavan Foil M.D.   On: 12/31/2017 16:48   Ct Cervical Spine Wo Contrast  Result Date: 12/31/2017 CLINICAL DATA:  Fall EXAM: CT HEAD WITHOUT CONTRAST CT CERVICAL SPINE WITHOUT CONTRAST TECHNIQUE: Multidetector CT imaging of the head and cervical spine was performed following the standard protocol without intravenous contrast. Multiplanar CT image reconstructions of the cervical spine were also generated. COMPARISON:  CT 10/21/2012 FINDINGS: CT HEAD FINDINGS Brain: No acute territorial infarction, hemorrhage or intracranial mass. Moderate atrophy with moderate severe small vessel ischemic changes of the white matter. Chronic left parietooccipital infarct. Stable prominent ventricles. Vascular: No hyperdense vessels.  Carotid vascular calcification Skull: Normal. Negative for fracture or focal lesion. Sinuses/Orbits: No acute finding. Other: None CT CERVICAL SPINE FINDINGS Alignment: Straightening of the cervical spine. No subluxation. Facet alignment within normal limits. Skull base and vertebrae: No acute fracture. No primary bone lesion or focal pathologic process. Soft tissues and spinal canal: No prevertebral fluid or swelling. No visible canal hematoma. Disc levels: Fusion at C5-C6. Moderate degenerative changes C3-C4, C4-C5 and C6-C7. Multiple level facet degenerative changes. Multiple level bilateral foraminal stenosis, most marked at C5-C6. Upper chest: Emphysema at the apices. Carotid vascular  calcification Other: None IMPRESSION: 1. No CT evidence for acute intracranial abnormality. Atrophy and small vessel ischemic changes of the white matter. Small chronic infarct in the left parietooccipital region. 2. Straightening of the cervical spine with degenerative changes. No acute osseous abnormality. 3. Apical emphysema. Electronically Signed   By: Donavan Foil M.D.   On: 12/31/2017 16:48   Dg Hips Bilat W Or Wo Pelvis 3-4 Views  Result Date: 12/31/2017 CLINICAL DATA:  Recent fall with right hip pain, initial encounter EXAM: DG HIP (WITH OR WITHOUT PELVIS) 3-4V BILAT COMPARISON:  03/21/2017 FINDINGS: There is mild irregularity in the right inferior pubic ramus suspicious for undisplaced fracture. No other fractures are seen. Degenerative changes of the hip joints are noted bilaterally. No soft tissue abnormality is seen. IMPRESSION: Changes suspicious for right inferior pubic ramus fracture. Electronically Signed   By: Inez Catalina M.D.   On: 12/31/2017 17:31       Scheduled Meds: . buPROPion  150 mg Oral Daily  . dutasteride  0.5 mg Oral Daily  . isosorbide mononitrate  30 mg Oral Daily  . mirtazapine  15 mg Oral QPM  . pantoprazole  40 mg Oral Daily  . rivaroxaban  20 mg Oral Daily  . rivastigmine  4.6 mg Transdermal Daily  .  saccharomyces boulardii  250 mg Oral BID  . simvastatin  20 mg Oral QHS   Continuous Infusions:   LOS: 1 day    Time spent: 30 minutes    Olis Viverette Darleen Crocker, DO [facility] Pager [phone]  If 7PM-7AM, please contact night-coverage [email or web address] Password [facility] 01/01/2018, 10:38 AM

## 2018-01-01 NOTE — Progress Notes (Signed)
Palliative Medicine consult order noted. PMT provider will return to [facility] on Monday 8/5. If the patient remains hospitalized, the consult will be evaluated at that time. If recommendations are needed in the interim, please call our office at [phone].   Marjie Skiff Kelyse Pask, RN, BSN, [facility] Palliative Medicine Team 01/01/2018 7:47 AM Office [phone]

## 2018-01-01 NOTE — Evaluation (Addendum)
Physical Therapy Evaluation Patient Details Name: [name] MRN: [id number] DOB: 1933-02-13 Today's Date: 01/01/2018   History of Present Illness  [name] is a 84 y.o. male with medical history significant for Atria fib, HTN, COPD chronic respiratory failure, dementia, frequent falls, who was brought to the ED via EMS with complaints of a fall that happened last night.  EMS was called in last night also but patient refused to come to the hospital.  But with increasing lower back pain patient presented to the ED today.  Patient lives at home with spouse who can no longer care for him.  Patient sustained a fall- witnessed last night when got up, was holding onto his walker, spouse was trying to get his wheelchair to him in a tight space, when trying to maneuver patient's legs could not hold him up resulting in the fall.     Clinical Impression  Patient had no c/o pain at rest, but c/o severe low back pain with movement for rolling to side and sitting up from side lying position, demonstrates slow labored cadence with kyphotic trunk and limited to a few steps in room before having to turn in sit due to increasing low back pain.  Patient was on room air when taking steps, when transferred to chair O2 saturation at 87-89% and put back on 2 LPM.  Patient tolerated sitting up in chair after therapy - RN aware.  Patient will benefit from continued physical therapy in hospital and recommended venue below to increase strength, balance, endurance for safe ADLs and gait.    Follow Up Recommendations SNF;Supervision/Assistance - 24 hour    Equipment Recommendations  None recommended by PT    Recommendations for Other Services       Precautions / Restrictions Precautions Precautions: Fall Restrictions Weight Bearing Restrictions: No      Mobility  Bed Mobility Overal bed mobility: Needs Assistance Bed Mobility: Supine to Sit     Supine to sit: Max assist        Transfers Overall  transfer level: Needs assistance Equipment used: Rolling walker (2 wheeled) Transfers: Sit to/from [facility] Sit to Stand: Mod assist Stand pivot transfers: Mod assist          Ambulation/Gait Ambulation/Gait assistance: Mod assist Gait Distance (Feet): 10 Feet Assistive device: Rolling walker (2 wheeled) Gait Pattern/deviations: Decreased step length - right;Decreased step length - left;Decreased stride length Gait velocity: slow   General Gait Details: limited to 10-12 slow unsteady steps in room due to c/o severe low back pain with movement  Stairs            Wheelchair Mobility    Modified Rankin (Stroke Patients Only)       Balance Overall balance assessment: Needs assistance Sitting-balance support: Feet supported;No upper extremity supported Sitting balance-Leahy Scale: Fair     Standing balance support: Bilateral upper extremity supported;During functional activity Standing balance-Leahy Scale: Fair                               Pertinent Vitals/Pain Pain Assessment: Faces Faces Pain Scale: Hurts whole lot Pain Location: low back with movement, none at rest Pain Descriptors / Indicators: Sharp;Discomfort;Grimacing Pain Intervention(s): Limited activity within patient's tolerance;Monitored during session;Patient requesting pain meds-RN notified    Home Living Family/patient expects to be discharged to:: Private residence Living Arrangements: Spouse/significant other Available Help at Discharge: Family;Available PRN/intermittently Type of Home: House Home Access:  Stairs to enter Entrance Stairs-Rails: Right Entrance Stairs-Number of Steps: 2 Home Layout: One level Home Equipment: Walker - 2 wheels;Wheelchair - manual;Bedside commode;Cane - single point Additional Comments: all information per patient    Prior Function Level of Independence: Independent with assistive device(s)         Comments: household  ambulator with cane     Hand Dominance        Extremity/Trunk Assessment   Upper Extremity Assessment Upper Extremity Assessment: Generalized weakness    Lower Extremity Assessment Lower Extremity Assessment: Generalized weakness    Cervical / Trunk Assessment Cervical / Trunk Assessment: Kyphotic  Communication   Communication: No difficulties  Cognition Arousal/Alertness: Awake/alert Behavior During Therapy: WFL for tasks assessed/performed Overall Cognitive Status: Within Functional Limits for tasks assessed                                        General Comments      Exercises     Assessment/Plan    PT Assessment Patient needs continued PT services  PT Problem List Decreased strength;Decreased activity tolerance;Decreased balance;Decreased mobility       PT Treatment Interventions Gait training;Stair training;Functional mobility training;Therapeutic activities;Therapeutic exercise;Patient/family education    PT Goals (Current goals can be found in the Care Plan section)  Acute Rehab PT Goals Patient Stated Goal: return home PT Goal Formulation: With patient Time For Goal Achievement: 01/15/18 Potential to Achieve Goals: Good    Frequency Min 3X/week   Barriers to discharge        Co-evaluation               AM-PAC PT "6 Clicks" Daily Activity  Outcome Measure Difficulty turning over in bed (including adjusting bedclothes, sheets and blankets)?: A Lot Difficulty moving from lying on back to sitting on the side of the bed? : A Lot Difficulty sitting down on and standing up from a chair with arms (e.g., wheelchair, bedside commode, etc,.)?: A Lot Help needed moving to and from a bed to chair (including a wheelchair)?: A Lot Help needed walking in hospital room?: A Lot Help needed climbing 3-5 steps with a railing? : A Lot 6 Click Score: 12    End of Session   Activity Tolerance: Patient tolerated treatment well;Patient  limited by pain;Patient limited by fatigue Patient left: in chair;with call bell/phone within reach;with chair alarm set Nurse Communication: Mobility status;Other (comment)(RN notified that patient left up in chair) PT Visit Diagnosis: Unsteadiness on feet (R26.81);Other abnormalities of gait and mobility (R26.89);Muscle weakness (generalized) (M62.81)    Time: 4270-6237 PT Time Calculation (min) (ACUTE ONLY): 31 min   Charges:   PT Evaluation $PT Eval Moderate Complexity: 1 Mod PT Treatments $Therapeutic Activity: 23-37 mins        11:31 AM, 01/01/18 Lonell Grandchild, MPT Physical Therapist with [facility] [phone] office [phone] mobile phone

## 2018-01-01 NOTE — NC FL2 (Signed)
[place] LEVEL OF CARE SCREENING TOOL     IDENTIFICATION  Patient Name: [name] Birthdate: [birth year] Sex: male Admission Date (Current Location): 12/31/2017  [facility] and [place] Number:  [employer] and Address:  [place] [address] [name]      Provider Number: [id number]  Attending Physician Name and Address:  Rodena Goldmann, DO  Relative Name and Phone Number:       Current Level of Care: Hospital Recommended Level of Care: [place] Prior Approval Number:    Date Approved/Denied:   PASRR Number: [id number]  Discharge Plan: SNF    Current Diagnoses: Patient Active Problem List   Diagnosis Date Noted  . Frequent falls 12/31/2017  . Palliative care by specialist   . Advance care planning   . Goals of care, counseling/discussion   . Dementia 09/30/2017  . Chest pain 09/29/2017  . Elevated PSA 02/02/2016  . Pulmonary emboli ([place]) 01/11/2016  . Actinic keratoses 11/16/2015  . Memory loss 11/16/2015  . Atypical chest pain 01/28/2015  . Right carotid bruit 09/26/2014  . Abnormal stress test   . Chronic atrial fibrillation ([place]) 09/04/2014  . On warfarin therapy 09/04/2014  . Encounter for therapeutic drug monitoring 07/26/2013  . Bradycardia 02/17/2013  . Acute on chronic systolic CHF (congestive heart failure) ([place]) 11/16/2012  . Acute on chronic renal failure ([place]) 11/16/2012  . Failure to thrive in adult 10/30/2012  . Anemia of chronic disease 10/13/2012  . Toxic encephalopathy secondary to UTI 10/12/2012  . Depression 10/12/2012  . Moderate protein-calorie malnutrition ([place]) 10/12/2012  . Stage III chronic kidney disease ([place]) 10/12/2012  . CHF (congestive heart failure) ([place]) 09/06/2012  . Chronic diastolic (congestive) heart failure ([place]) 07/23/2012  . Hypokalemia 07/20/2012  . Hypomagnesemia 07/20/2012  . Hypocalcemia 07/20/2012  . Acute kidney injury ([place]) 07/20/2012   . Chronic diarrhea 07/20/2012  . Falls frequently 07/20/2012  . Leukocytosis 07/20/2012  . Normocytic anemia 07/20/2012  . Chronic respiratory failure ([place]) 05/23/2012  . COPD exacerbation ([place]) 05/22/2012  . Gastritis with hemorrhage 05/13/2012  . Stricture and stenosis of esophagus 05/13/2012  . Acute posthemorrhagic anemia 05/12/2012  . Atrial fibrillation with rapid ventricular response ([place]) 05/06/2012  . Dysphagia 05/06/2012  . HTN (hypertension) 05/06/2012  . H/O: CVA (cerebrovascular accident) 05/06/2012  . GERD (gastroesophageal reflux disease) 05/06/2012  . Purulent bronchitis ([place]) 05/06/2012  . SOB (shortness of breath) 03/19/2010  . ABDOMINAL DISTENSION 03/19/2010  . FATIGUE 11/09/2009  . B12 DEFICIENCY 07/26/2009  . Hyperglycemia 07/26/2009  . Hyperlipidemia 07/20/2009  . Hereditary and idiopathic peripheral neuropathy 07/20/2009  . BENIGN PROSTATIC HYPERTROPHY, HX OF 07/20/2009  . Essential hypertension 09/05/2007  . Coronary atherosclerosis 09/05/2007  . FIBRILLATION, ATRIAL 09/05/2007  . Cerebral artery occlusion with cerebral infarction ([place]) 09/05/2007  . ESOPHAGITIS 09/05/2007  . GERD 09/05/2007  . EROSIVE GASTRITIS 09/05/2007  . UPPER GASTROINTESTINAL HEMORRHAGE 09/05/2007  . DYSPHAGIA UNSPECIFIED 09/05/2007  . INTERNAL HEMORRHOIDS 06/10/2003  . GASTRIC POLYP 12/03/2001  . HIATAL HERNIA 12/03/2001  . COLONIC POLYPS 09/25/1992  . GASTRIC ULCER 09/22/1992    Orientation RESPIRATION BLADDER Height & Weight     Self, Situation, Place  O2(1.5L) Incontinent Weight: 147 lb (66.7 kg) Height:  5\' 11"  (180.3 cm)  BEHAVIORAL SYMPTOMS/MOOD NEUROLOGICAL BOWEL NUTRITION STATUS      Continent    AMBULATORY STATUS COMMUNICATION OF NEEDS Skin   Limited Assist Verbally Normal  Personal Care Assistance Level of Assistance  Bathing, Feeding, Dressing Bathing Assistance: Limited assistance Feeding assistance: Independent Dressing  Assistance: Limited assistance     Functional Limitations Info  Sight, Hearing, Speech Sight Info: Adequate Hearing Info: Adequate Speech Info: Adequate    SPECIAL CARE FACTORS FREQUENCY  PT (By licensed PT)     PT Frequency: 5x/week              Contractures Contractures Info: Not present    Additional Factors Info  Code Status, Allergies, Psychotropic Code Status Info: DNR Allergies Info: Lovenox, Tape, penicillins,  Psychotropic Info: Wellbutrin, Remeron         Current Medications (01/01/2018):  This is the current hospital active medication list Current Facility-Administered Medications  Medication Dose Route Frequency Provider Last Rate Last Dose  . acetaminophen (TYLENOL) tablet 650 mg  650 mg Oral Q6H PRN Emokpae, Ejiroghene E, MD   650 mg at 01/01/18 1049   Or  . acetaminophen (TYLENOL) suppository 650 mg  650 mg Rectal Q6H PRN Emokpae, Ejiroghene E, MD      . albuterol (PROVENTIL) (2.5 MG/3ML) 0.083% nebulizer solution 2.5 mg  2.5 mg Nebulization Q6H PRN Emokpae, Ejiroghene E, MD      . buPROPion (WELLBUTRIN XL) 24 hr tablet 150 mg  150 mg Oral Daily Emokpae, Ejiroghene E, MD   150 mg at 01/01/18 1049  . dutasteride (AVODART) capsule 0.5 mg  0.5 mg Oral Daily Emokpae, Ejiroghene E, MD   0.5 mg at 01/01/18 1049  . hydrALAZINE (APRESOLINE) injection 10 mg  10 mg Intravenous Q4H PRN Manuella Ghazi, Pratik D, DO      . ipratropium (ATROVENT) nebulizer solution 0.25 mg  0.25 mg Nebulization Q6H PRN Emokpae, Ejiroghene E, MD      . isosorbide mononitrate (IMDUR) 24 hr tablet 30 mg  30 mg Oral Daily Emokpae, Ejiroghene E, MD   30 mg at 01/01/18 1049  . mirtazapine (REMERON) tablet 15 mg  15 mg Oral QPM Emokpae, Ejiroghene E, MD   15 mg at 12/31/17 2233  . morphine 2 MG/ML injection 2 mg  2 mg Intravenous Q4H PRN Emokpae, Ejiroghene E, MD      . ondansetron (ZOFRAN) tablet 4 mg  4 mg Oral Q6H PRN Emokpae, Ejiroghene E, MD       Or  . ondansetron (ZOFRAN) injection 4 mg  4 mg  Intravenous Q6H PRN Emokpae, Ejiroghene E, MD      . pantoprazole (PROTONIX) EC tablet 40 mg  40 mg Oral Daily Emokpae, Ejiroghene E, MD   40 mg at 01/01/18 1049  . rivaroxaban (XARELTO) tablet 20 mg  20 mg Oral Daily Emokpae, Ejiroghene E, MD   20 mg at 01/01/18 1049  . rivastigmine (EXELON) 4.6 mg/24hr 4.6 mg  4.6 mg Transdermal Daily Emokpae, Ejiroghene E, MD   4.6 mg at 01/01/18 1049  . saccharomyces boulardii (FLORASTOR) capsule 250 mg  250 mg Oral BID Emokpae, Ejiroghene E, MD   250 mg at 01/01/18 1049  . simvastatin (ZOCOR) tablet 20 mg  20 mg Oral QHS Emokpae, Ejiroghene E, MD   20 mg at 12/31/17 2233     Discharge Medications: Please see discharge summary for a list of discharge medications.  Relevant Imaging Results:  Relevant Lab Results:   Additional Information SSN [id number]  [name], Darrell Pugh, LCSW

## 2018-01-01 NOTE — Plan of Care (Signed)
  Problem: Acute Rehab PT Goals(only PT should resolve) Goal: Pt will Roll Supine to Side Outcome: Progressing Flowsheets (Taken 01/01/2018 1135) Pt will Roll Supine to Side: with min assist Goal: Pt Will Go Supine/Side To Sit Outcome: Progressing Flowsheets (Taken 01/01/2018 1135) Pt will go Supine/Side to Sit: with minimal assist Goal: Patient Will Transfer Sit To/From Stand Outcome: Progressing Flowsheets (Taken 01/01/2018 1135) Patient will transfer sit to/from stand: with min guard assist Goal: Pt Will Transfer Bed To Chair/Chair To Bed Outcome: Progressing Flowsheets (Taken 01/01/2018 1135) Pt will Transfer Bed to Chair/Chair to Bed: min guard assist Goal: Pt Will Ambulate Outcome: Progressing Flowsheets (Taken 01/01/2018 1135) Pt will Ambulate: 50 feet;with minimal assist;with rolling walker   11:36 AM, 01/01/18 Lonell Grandchild, MPT Physical Therapist with [facility] [phone] office [phone] mobile phone

## 2018-01-01 NOTE — Clinical Social Work Note (Signed)
Clinical Social Work Assessment  Patient Details  Name: [name] MRN: [id number] Date of Birth: 1932-08-03  Date of referral:  01/01/18               Reason for consult:  Facility Placement                Permission sought to share information with:    Permission granted to share information::     Name::        Agency::     Relationship::     Contact Information:  Message left for spouse requesting return contact.   Housing/Transportation Living arrangements for the past 2 months:  [place] of Information:  Patient Patient Interpreter Needed:  None Criminal Activity/Legal Involvement Pertinent to Current Situation/Hospitalization:  No - Comment as needed Significant Relationships:  Adult Children, Spouse Lives with:  Spouse Do you feel safe going back to the place where you live?  Yes Need for family participation in patient care:  Yes (Comment)  Care giving concerns:  None identified.    Social Worker assessment / plan:  Patient lives with his spouse.  He ambulates with a cane and a walker. He mainly uses a walker.  He states that he is independent in his ADLs and he drives.  He states that he is not interested in SNF and he has been to SNF in the past. (Record review shows that he went to SNF in 2014).  LCSW left a message for patient's wife, requesting return contact.   Employment status:  Retired Forensic scientist:    PT Recommendations:  [place], [place] / Referral to community resources:  [place]  Patient/Family's Response to care:  Patient is not agreeable to SNF. A message was left for patient's wife.   Patient/Family's Understanding of and Emotional Response to Diagnosis, Current Treatment, and Prognosis:  Patient is aware of this diagnosis, treatment and prognosis. A message requesting return contact was left for his wife.   Emotional Assessment Appearance:  Appears stated  age Attitude/Demeanor/Rapport:    Affect (typically observed):  Accepting, Calm Orientation:  Oriented to Self, Oriented to Place, Oriented to Situation Alcohol / Substance use:  Not Applicable Psych involvement (Current and /or in the community):  No (Comment)  Discharge Needs  Concerns to be addressed:  Discharge Planning Concerns Readmission within the last 30 days:  No Current discharge risk:  None Barriers to Discharge:  No Barriers Identified   Ihor Gully, LCSW 01/01/2018, 3:22 PM

## 2018-01-02 DIAGNOSIS — I10 Essential (primary) hypertension: Secondary | ICD-10-CM | POA: Diagnosis not present

## 2018-01-02 DIAGNOSIS — I482 Chronic atrial fibrillation: Secondary | ICD-10-CM | POA: Diagnosis not present

## 2018-01-02 DIAGNOSIS — F0390 Unspecified dementia without behavioral disturbance: Secondary | ICD-10-CM | POA: Diagnosis not present

## 2018-01-02 LAB — CBC
HCT: 41.3 % (ref 39.0–52.0)
Hemoglobin: 13.3 g/dL (ref 13.0–17.0)
MCH: 29.9 pg (ref 26.0–34.0)
MCHC: 32.2 g/dL (ref 30.0–36.0)
MCV: 92.8 fL (ref 78.0–100.0)
PLATELETS: 144 10*3/uL — AB (ref 150–400)
RBC: 4.45 MIL/uL (ref 4.22–5.81)
RDW: 13.7 % (ref 11.5–15.5)
WBC: 5.9 10*3/uL (ref 4.0–10.5)

## 2018-01-02 LAB — BASIC METABOLIC PANEL
ANION GAP: 6 (ref 5–15)
BUN: 30 mg/dL — AB (ref 8–23)
CALCIUM: 8.7 mg/dL — AB (ref 8.9–10.3)
CO2: 28 mmol/L (ref 22–32)
CREATININE: 0.79 mg/dL (ref 0.61–1.24)
Chloride: 108 mmol/L (ref 98–111)
GFR calc Af Amer: >60 mL/min (ref >60)
GFR calc non Af Amer: >60 mL/min (ref >60)
GLUCOSE: 105 mg/dL — AB (ref 70–99)
POTASSIUM: 3.4 mmol/L — AB (ref 3.5–5.1)
Sodium: 142 mmol/L (ref 135–145)

## 2018-01-02 MED ORDER — HYDROCODONE-ACETAMINOPHEN 5-325 MG PO TABS: 1.0000 | ORAL_TABLET | ORAL | 0 refills | Status: AC | PRN

## 2018-01-02 NOTE — Care Management Important Message (Signed)
Important Message  Patient Details  Name: [name] MRN: [id number] Date of Birth: [birth year]   Medicare Important Message Given:  Yes    Shelda Altes 01/02/2018, 12:40 PM

## 2018-01-02 NOTE — Discharge Summary (Signed)
Physician Discharge Summary  [name] RWE:[id number] DOB: [birth year] DOA: 12/31/2017  PCP: [name], MD  Admit date: 12/31/2017  Discharge date: 01/02/2018  Admitted From:Home  Disposition:  SNF  Recommendations for Outpatient Follow-up:  1. Follow up with PCP in 1-2 weeks  Home Health:N/A  Equipment/Devices:N/A  Discharge Condition:Stable  CODE STATUS: DNR  Diet recommendation: Heart Healthy  Brief/Interim Summary:  Darrell Delo Sharonis a 84 y.o.malewith medical history significantfor Atria fib, HTN, COPDchronic respiratory failure, dementia,frequent falls,who was brought to the ED via EMS with complaints of a fall that happened last night.  Patient was noted to have a pubic rami fracture on the right side and is noted to have prior L2 compression deformity as well as right eighth and ninth rib fractures from previous falls.  He has been admitted with increased frailty and his spouse can no longer care for him at home.  Patient has adequate pain control with oral medications and can ambulate.  He has been recommended to go to inpatient rehabilitation per physical therapy recommendations.  He has had no other acute events during the course of this admission.   Discharge Diagnoses:  Active Problems:   Essential hypertension   FIBRILLATION, ATRIAL   Falls frequently   Pulmonary emboli (HCC)   Dementia   Palliative care by specialist   Frequent falls  1. Frequent falls secondary to increased frailty.  Patient has had prior fractures with new pubic ramus fracture noted.    Continue pain control with Norco every 4 hours as needed for breakthrough pain as well as ongoing physical therapy at inpatient rehabilitation facility. 2. Atrial fibrillation with prior PE.  Continue anticoagulation with Xarelto per family recommendations although this is not indicated in the face of frequent falls.    Tried calling family members multiple times with no answer.  Rate has  remained controlled.  Patient needs to have this revisited with encouragement to discontinue anticoagulation. 3. Dementia with history of sundowning.  Continue on Rivastigmine patch, Wellbutrin, and mirtazapine. 4. Hypertension-controlled.  Continue Imdur. 5. BPH.  Continue dutasteride.   Discharge Instructions  Discharge Instructions    Diet - low sodium heart healthy   Complete by:  As directed    Increase activity slowly   Complete by:  As directed      Allergies as of 01/02/2018      Reactions   Lovenox [enoxaparin Sodium] Swelling   Tape Other (See Comments)   SKIN WILL TEAR IF TAPED (MUST BE LIGHT & BRIEF OR USE COBAN WRAP, PLEASE!!)   Penicillins Hives   Has patient had a PCN reaction causing immediate rash, facial/tongue/throat swelling, SOB or lightheadedness with hypotension: Yes Has patient had a PCN reaction causing severe rash involving mucus membranes or skin necrosis: No Has patient had a PCN reaction that required hospitalization: Unknown Has patient had a PCN reaction occurring within the last 10 years: No If all of the above answers are "NO", then may proceed with Cephalosporin use.      Medication List    TAKE these medications   buPROPion 150 MG 24 hr tablet Commonly known as:  WELLBUTRIN XL Take 1 tablet (150 mg total) by mouth daily.   clotrimazole-betamethasone cream Commonly known as:  LOTRISONE APPLY TO AFFECTED AREAS TWICE A DAY What changed:  See the new instructions.   dutasteride 0.5 MG capsule Commonly known as:  AVODART TAKE (1) CAPSULE DAILY   furosemide 20 MG tablet Commonly known as:  LASIX Take 1  tablet (20 mg total) by mouth as needed. For edema   HYDROcodone-acetaminophen 5-325 MG tablet Commonly known as:  NORCO/VICODIN Take 1 tablet by mouth every 4 (four) hours as needed for up to 7 days.   ipratropium 0.02 % nebulizer solution Commonly known as:  ATROVENT NEBULIZE 1 VAIL 4 TIMES A DAY AS DIRECTED What changed:  See the new  instructions.   isosorbide mononitrate 30 MG 24 hr tablet Commonly known as:  IMDUR Take 1 tablet (30 mg total) by mouth daily.   levalbuterol 0.63 MG/3ML nebulizer solution Commonly known as:  XOPENEX NEBULIZE 1 VIAL (WITH IPRATROPIUM) TWICE A DAY AS NEEDED. MAY USE EXTR A EVERY 4 HOURS IF NEEDED   mirtazapine 15 MG tablet Commonly known as:  REMERON TAKE 1 TABLET DAILY What changed:    how much to take  how to take this  when to take this   mupirocin ointment 2 % Commonly known as:  BACTROBAN Apply 1 application topically 2 (two) times daily.   nitroGLYCERIN 0.4 MG SL tablet Commonly known as:  NITROSTAT Place 1 tablet (0.4 mg total) under the tongue every 5 (five) minutes x 3 doses as needed for chest pain.   pantoprazole 40 MG tablet Commonly known as:  PROTONIX Take 1 tablet (40 mg total) by mouth daily.   potassium chloride 10 MEQ tablet Commonly known as:  K-DUR TAKE (2) TABLETS TWICE DAILY. What changed:  Another medication with the same name was removed. Continue taking this medication, and follow the directions you see here.   rivastigmine 4.6 mg/24hr Commonly known as:  EXELON PLACE 1 PATCH ONTO SKIN DAILY AS DIRECTED   saccharomyces boulardii 250 MG capsule Commonly known as:  FLORASTOR Take 250 mg by mouth 2 (two) times daily.   simvastatin 20 MG tablet Commonly known as:  ZOCOR TAKE ONE TABLET AT BEDTIME   vitamin B-12 500 MCG tablet Commonly known as:  CYANOCOBALAMIN Take 500 mcg by mouth daily.   XARELTO 20 MG Tabs tablet Generic drug:  rivaroxaban TAKE 1 TABLET DAILY WITH SUPPER      Follow-up Information    [name], MD Follow up in 1 week(s).   Specialty:  Family Medicine Contact information: [address] [place] [address] [phone]          Allergies  Allergen Reactions  . Lovenox [Enoxaparin Sodium] Swelling  . Tape Other (See Comments)    SKIN WILL TEAR IF TAPED (MUST BE LIGHT & BRIEF OR USE  COBAN WRAP, PLEASE!!)  . Penicillins Hives    Has patient had a PCN reaction causing immediate rash, facial/tongue/throat swelling, SOB or lightheadedness with hypotension: Yes Has patient had a PCN reaction causing severe rash involving mucus membranes or skin necrosis: No Has patient had a PCN reaction that required hospitalization: Unknown Has patient had a PCN reaction occurring within the last 10 years: No If all of the above answers are "NO", then may proceed with Cephalosporin use.     Consultations:  None   Procedures/Studies: Dg Ribs Unilateral W/chest Right  Result Date: 12/31/2017 CLINICAL DATA:  Recent fall with back and rib pain, initial encounter EXAM: RIGHT RIBS AND CHEST - 3+ VIEW COMPARISON:  12/28/2017 FINDINGS: Cardiac shadow is stable. Prominence of the pulmonary vasculature is noted centrally stable from the prior exam consistent with pulmonary arterial hypertension. Stable irregularity of the right anterior eighth and ninth ribs are seen. No displaced fractures are identified. No pneumothorax is noted. Stable retrocardiac density  is noted on the left. IMPRESSION: Changes in the right eighth and ninth rib anteriorly likely related to prior trauma with healing. No definitive acute fractures are noted. Electronically Signed   By: Inez Catalina M.D.   On: 12/31/2017 17:25   Dg Ribs Unilateral W/chest Right  Result Date: 12/28/2017 CLINICAL DATA:  Pain. EXAM: RIGHT RIBS AND CHEST - 3+ VIEW COMPARISON:  Chest x-ray September 29, 2017 FINDINGS: There is chronic opacity in left retrocardiac region. A previous CT scan demonstrated a large hiatal hernia and adjacent atelectasis in this region. The right pulmonary artery is prominent. Left pulmonary artery is obscured by patient rotation. No pneumothorax. No other change within the chest. Subtle step-off in the anterior right eighth and ninth ribs. No other evidence of fracture. IMPRESSION: 1. Subtle irregularity of the anterior right  eighth and ninth ribs. While not certain, these irregularity could represent very subtle fractures. No pneumothorax. 2. Enlargement of the right pulmonary artery is unchanged, suggesting pulmonary arterial hypertension. 3. There is opacity in left retrocardiac region. There has been chronic opacity in this area consistent with a hiatal hernia and adjacent atelectasis. The finding is slightly more prominent today. The change could be due to technique, increased atelectasis adjacent to the hiatal hernia, or less likely acute on chronic infiltrate. Electronically Signed   By: Dorise Bullion III M.D   On: 12/28/2017 15:59   Dg Lumbar Spine Complete  Result Date: 12/31/2017 CLINICAL DATA:  84 y/o M; lower back pain, right rib pain, bilateral hip pain. Fall last night. EXAM: LUMBAR SPINE - COMPLETE 4+ VIEW COMPARISON:  12/28/2017 lumbar spine radiograph. FINDINGS: Stable moderate lumbar spine dextrocurvature with apex at L2. Normal lumbar lordosis without listhesis. Stable mild L2 superior endplate compression deformity. Stable mild loss of height of the L3-4 and L5-S1 intervertebral disc spaces. Stable lower lumbar facet arthrosis. Bones are demineralized. Calcific atherosclerosis of the abdominal aorta. IMPRESSION: 1. Stable L2 mild compression deformity. 2. No new acute fracture or dislocation identified. Electronically Signed   By: Kristine Garbe M.D.   On: 12/31/2017 17:22   Dg Lumbar Spine Complete  Result Date: 12/28/2017 CLINICAL DATA:  Fall with lower back pain.  Initial encounter. EXAM: LUMBAR SPINE - COMPLETE 4+ VIEW COMPARISON:  03/21/2017 FINDINGS: L2 superior endplate and anterior body fracture with mild height loss (less than 50%). No evident retropulsion. Lower lumbar facet degeneration with L4-5 anterolisthesis. Spondylosis and disc narrowing with mild dextroscoliosis Osteopenia. IMPRESSION: Acute appearing L2 body fracture with mild height loss. Electronically Signed   By: Monte Fantasia M.D.   On: 12/28/2017 15:58   Ct Head Wo Contrast  Result Date: 12/31/2017 CLINICAL DATA:  Fall EXAM: CT HEAD WITHOUT CONTRAST CT CERVICAL SPINE WITHOUT CONTRAST TECHNIQUE: Multidetector CT imaging of the head and cervical spine was performed following the standard protocol without intravenous contrast. Multiplanar CT image reconstructions of the cervical spine were also generated. COMPARISON:  CT 10/21/2012 FINDINGS: CT HEAD FINDINGS Brain: No acute territorial infarction, hemorrhage or intracranial mass. Moderate atrophy with moderate severe small vessel ischemic changes of the white matter. Chronic left parietooccipital infarct. Stable prominent ventricles. Vascular: No hyperdense vessels.  Carotid vascular calcification Skull: Normal. Negative for fracture or focal lesion. Sinuses/Orbits: No acute finding. Other: None CT CERVICAL SPINE FINDINGS Alignment: Straightening of the cervical spine. No subluxation. Facet alignment within normal limits. Skull base and vertebrae: No acute fracture. No primary bone lesion or focal pathologic process. Soft tissues and spinal canal: No prevertebral fluid or  swelling. No visible canal hematoma. Disc levels: Fusion at C5-C6. Moderate degenerative changes C3-C4, C4-C5 and C6-C7. Multiple level facet degenerative changes. Multiple level bilateral foraminal stenosis, most marked at C5-C6. Upper chest: Emphysema at the apices. Carotid vascular calcification Other: None IMPRESSION: 1. No CT evidence for acute intracranial abnormality. Atrophy and small vessel ischemic changes of the white matter. Small chronic infarct in the left parietooccipital region. 2. Straightening of the cervical spine with degenerative changes. No acute osseous abnormality. 3. Apical emphysema. Electronically Signed   By: Donavan Foil M.D.   On: 12/31/2017 16:48   Ct Cervical Spine Wo Contrast  Result Date: 12/31/2017 CLINICAL DATA:  Fall EXAM: CT HEAD WITHOUT CONTRAST CT CERVICAL SPINE  WITHOUT CONTRAST TECHNIQUE: Multidetector CT imaging of the head and cervical spine was performed following the standard protocol without intravenous contrast. Multiplanar CT image reconstructions of the cervical spine were also generated. COMPARISON:  CT 10/21/2012 FINDINGS: CT HEAD FINDINGS Brain: No acute territorial infarction, hemorrhage or intracranial mass. Moderate atrophy with moderate severe small vessel ischemic changes of the white matter. Chronic left parietooccipital infarct. Stable prominent ventricles. Vascular: No hyperdense vessels.  Carotid vascular calcification Skull: Normal. Negative for fracture or focal lesion. Sinuses/Orbits: No acute finding. Other: None CT CERVICAL SPINE FINDINGS Alignment: Straightening of the cervical spine. No subluxation. Facet alignment within normal limits. Skull base and vertebrae: No acute fracture. No primary bone lesion or focal pathologic process. Soft tissues and spinal canal: No prevertebral fluid or swelling. No visible canal hematoma. Disc levels: Fusion at C5-C6. Moderate degenerative changes C3-C4, C4-C5 and C6-C7. Multiple level facet degenerative changes. Multiple level bilateral foraminal stenosis, most marked at C5-C6. Upper chest: Emphysema at the apices. Carotid vascular calcification Other: None IMPRESSION: 1. No CT evidence for acute intracranial abnormality. Atrophy and small vessel ischemic changes of the white matter. Small chronic infarct in the left parietooccipital region. 2. Straightening of the cervical spine with degenerative changes. No acute osseous abnormality. 3. Apical emphysema. Electronically Signed   By: Donavan Foil M.D.   On: 12/31/2017 16:48   Dg Hips Bilat W Or Wo Pelvis 3-4 Views  Result Date: 12/31/2017 CLINICAL DATA:  Recent fall with right hip pain, initial encounter EXAM: DG HIP (WITH OR WITHOUT PELVIS) 3-4V BILAT COMPARISON:  03/21/2017 FINDINGS: There is mild irregularity in the right inferior pubic ramus suspicious  for undisplaced fracture. No other fractures are seen. Degenerative changes of the hip joints are noted bilaterally. No soft tissue abnormality is seen. IMPRESSION: Changes suspicious for right inferior pubic ramus fracture. Electronically Signed   By: Inez Catalina M.D.   On: 12/31/2017 17:31    Discharge Exam: Vitals:   01/01/18 2137 01/02/18 0550  BP: (!) 163/104 (!) 164/89  Pulse: 91 (!) 101  Resp:    Temp: 98.3 F (36.8 C) 97.8 F (36.6 C)  SpO2: 98% 99%   Vitals:   01/01/18 0631 01/01/18 1445 01/01/18 2137 01/02/18 0550  BP: (!) 148/100 139/89 (!) 163/104 (!) 164/89  Pulse: 93 (!) 110 91 (!) 101  Resp:  18    Temp: 97.7 F (36.5 C) 98.2 F (36.8 C) 98.3 F (36.8 C) 97.8 F (36.6 C)  TempSrc: Oral Oral Oral Oral  SpO2: 96% 95% 98% 99%  Weight:      Height:        General: Pt is alert, awake, not in acute distress Cardiovascular: RRR, S1/S2 +, no rubs, no gallops Respiratory: CTA bilaterally, no wheezing, no rhonchi Abdominal: Soft, NT,  ND, bowel sounds + Extremities: no edema, no cyanosis    The results of significant diagnostics from this hospitalization (including imaging, microbiology, ancillary and laboratory) are listed below for reference.     Microbiology: Recent Results (from the past 240 hour(s))  Urine culture     Status: None   Collection Time: 12/31/17  4:06 PM  Result Value Ref Range Status   Specimen Description   Final    URINE, RANDOM Performed at [facility], [address] [place], [place] [address]    Special Requests   Final    NONE Performed at [facility], [address], [place], [place] [address]    Culture   Final    NO GROWTH Performed at [facility], [place] [address] [place], [place] [address]    Report Status 01/01/2018 FINAL  Final     Labs: BNP (last 3 results) Recent Labs    09/30/17 0012  BNP 09.3   Basic Metabolic Panel: Recent Labs  Lab 12/28/17 1437 12/31/17 1704 01/02/18 0523  NA 142 140 142  K  3.8 4.4 3.4*  CL 106 105 108  CO2 26 26 28   GLUCOSE 113* 110* 105*  BUN 13 22 30*  CREATININE 1.00 0.96 0.79  CALCIUM 9.3 9.2 8.7*   Liver Function Tests: Recent Labs  Lab 12/28/17 1437  AST 24  ALT 19  ALKPHOS 62  BILITOT 1.5*  PROT 6.8  ALBUMIN 3.9   No results for input(s): LIPASE, AMYLASE in the last 168 hours. No results for input(s): AMMONIA in the last 168 hours. CBC: Recent Labs  Lab 12/28/17 1437 12/31/17 1704 01/02/18 0523  WBC 8.8 7.9 5.9  NEUTROABS 7.3 6.5  --   HGB 15.4 15.4 13.3  HCT 47.6 45.9 41.3  MCV 93.3 92.4 92.8  PLT 141* 139* 144*   Cardiac Enzymes: Recent Labs  Lab 12/31/17 1704  TROPONINI 0.03*   BNP: Invalid input(s): POCBNP CBG: No results for input(s): GLUCAP in the last 168 hours. D-Dimer No results for input(s): DDIMER in the last 72 hours. Hgb A1c No results for input(s): HGBA1C in the last 72 hours. Lipid Profile No results for input(s): CHOL, HDL, LDLCALC, TRIG, CHOLHDL, LDLDIRECT in the last 72 hours. Thyroid function studies No results for input(s): TSH, T4TOTAL, T3FREE, THYROIDAB in the last 72 hours.  Invalid input(s): FREET3 Anemia work up No results for input(s): VITAMINB12, FOLATE, FERRITIN, TIBC, IRON, RETICCTPCT in the last 72 hours. Urinalysis    Component Value Date/Time   COLORURINE YELLOW 12/31/2017 1606   APPEARANCEUR CLEAR 12/31/2017 1606   LABSPEC 1.020 12/31/2017 1606   PHURINE 5.0 12/31/2017 1606   GLUCOSEU NEGATIVE 12/31/2017 1606   HGBUR SMALL (A) 12/31/2017 1606   BILIRUBINUR NEGATIVE 12/31/2017 1606   BILIRUBINUR 1+ 05/11/2015 1420   KETONESUR 20 (A) 12/31/2017 1606   PROTEINUR NEGATIVE 12/31/2017 1606   UROBILINOGEN 0.2 05/11/2015 1420   UROBILINOGEN 0.2 11/16/2012 1237   NITRITE NEGATIVE 12/31/2017 1606   LEUKOCYTESUR NEGATIVE 12/31/2017 1606   Sepsis Labs Invalid input(s): PROCALCITONIN,  WBC,  LACTICIDVEN Microbiology Recent Results (from the past 240 hour(s))  Urine culture      Status: None   Collection Time: 12/31/17  4:06 PM  Result Value Ref Range Status   Specimen Description   Final    URINE, RANDOM Performed at [facility], [address], [place],  [address]    Special Requests   Final    NONE Performed at [facility],  [address] [place], [place] [address]    Culture   Final    NO GROWTH Performed at [facility], [place] [address], [place], [place] [address]    Report Status 01/01/2018 FINAL  Final     Time coordinating discharge: 40 minutes  SIGNED:   Rodena Goldmann, DO [facility] 01/02/2018, 12:46 PM Pager [phone]  If 7PM-7AM, please contact night-coverage [email or web address] Password [email or web address]

## 2018-01-02 NOTE — Care Management CC44 (Signed)
Condition Code 44 Documentation Completed  Patient Details  Name: [name] MRN: [id number] Date of Birth: [birth year]   Condition Code 44 given:  Yes Patient signature on Condition Code 44 notice:  Yes Documentation of 2 MD's agreement:  Yes Code 44 added to claim:  Yes    Sherald Barge, RN 01/02/2018, 3:42 PM

## 2018-01-02 NOTE — Care Management Obs Status (Signed)
[facility] NOTIFICATION   Patient Details  Name: [name] MRN: [id number] Date of Birth: 1932-11-08   Medicare Observation Status Notification Given:  Yes    Sherald Barge, RN 01/02/2018, 3:42 PM

## 2018-01-02 NOTE — Clinical Social Work Note (Signed)
Mrs. Bauernfeind stated that she feels patient needs to go to SNF. She indicated that patient has become too difficult to manage. She stated that patient does not drive nor complete his ADLs.  She bathes patient. Patient feeds himself.  She stated that she is unsure as to whether she will be able to manage patient in the future because he did not get back to his baseline the last time he was in short term SNF.  Mrs. Trembath stated that she was also going to go down to DSS to apply for Medicaid for patient.    Dymin Dingledine, Clydene Pugh, LCSW

## 2018-01-02 NOTE — Progress Notes (Addendum)
Physical Therapy Treatment Patient Details Name: [name] MRN: [id number] DOB: 06-29-1932 Today's Date: 01/02/2018    History of Present Illness [name] is a 84 y.o. male with medical history significant for Atria fib, HTN, COPD chronic respiratory failure, dementia, frequent falls, who was brought to the ED via EMS with complaints of a fall that happened last night.  EMS was called in last night also but patient refused to come to the hospital.  But with increasing lower back pain patient presented to the ED today.  Patient lives at home with spouse who can no longer care for him.  Patient sustained a fall- witnessed last night when got up, was holding onto his walker, spouse was trying to get his wheelchair to him in a tight space, when trying to maneuver patient's legs could not hold him up resulting in the fall.     PT Comments    Patient limited to sitting up at bedside due to c/o severe low back pain, required much assistance for rolling and sitting up from side lying position, attempted sit to stand, but unable due to severe low back pain and put back to bed to eat breakfast - RN notified for pain medication.  Patient on room air during visit with O2 saturation dropping to 87% and put back on 2 LPM.  Patient will benefit from continued physical therapy in hospital and recommended venue below to increase strength, balance, endurance for safe ADLs and gait.    Follow Up Recommendations  SNF;Supervision/Assistance - 24 hour     Equipment Recommendations  None recommended by PT    Recommendations for Other Services       Precautions / Restrictions Precautions Precautions: Fall Precaution Comments: L2 fracture, 8th, 96th right rib fractures Restrictions Weight Bearing Restrictions: No    Mobility  Bed Mobility   Bed Mobility: Supine to Sit;Sit to Supine;Rolling;Sidelying to Sit Rolling: Mod assist Sidelying to sit: Mod assist;Max assist   Sit to supine: Max assist    General bed mobility comments: required assistance to move upper torso and legs  Transfers                    Ambulation/Gait                 Stairs             Wheelchair Mobility    Modified Rankin (Stroke Patients Only)       Balance Overall balance assessment: Needs assistance Sitting-balance support: Feet supported;Bilateral upper extremity supported Sitting balance-Leahy Scale: Fair Sitting balance - Comments: fair/good supporting self with BUE                                    Cognition Arousal/Alertness: Awake/alert Behavior During Therapy: WFL for tasks assessed/performed Overall Cognitive Status: Within Functional Limits for tasks assessed                                        Exercises      General Comments        Pertinent Vitals/Pain Pain Assessment: Faces Faces Pain Scale: Hurts whole lot Pain Location: low back with movement Pain Descriptors / Indicators: Sharp;Discomfort;Grimacing Pain Intervention(s): Limited activity within patient's tolerance;Monitored during session;Patient requesting pain meds-RN notified    Home Living  Prior Function            PT Goals (current goals can now be found in the care plan section) Acute Rehab PT Goals Patient Stated Goal: return home PT Goal Formulation: With patient Time For Goal Achievement: 01/15/18 Potential to Achieve Goals: Good Progress towards PT goals: Not progressing toward goals - comment(limited in early AM due to back pain)    Frequency    Min 3X/week      PT Plan Current plan remains appropriate    Co-evaluation              AM-PAC PT "6 Clicks" Daily Activity  Outcome Measure  Difficulty turning over in bed (including adjusting bedclothes, sheets and blankets)?: A Lot Difficulty moving from lying on back to sitting on the side of the bed? : A Lot Difficulty sitting down on and standing  up from a chair with arms (e.g., wheelchair, bedside commode, etc,.)?: A Lot Help needed moving to and from a bed to chair (including a wheelchair)?: A Lot Help needed walking in hospital room?: A Lot Help needed climbing 3-5 steps with a railing? : A Lot 6 Click Score: 12    End of Session   Activity Tolerance: Patient limited by pain;Patient limited by fatigue Patient left: in bed;with call bell/phone within reach;with bed alarm set Nurse Communication: Mobility status;Other (comment)(RN notified that patient needs pain medication) PT Visit Diagnosis: Unsteadiness on feet (R26.81);Other abnormalities of gait and mobility (R26.89);Muscle weakness (generalized) (M62.81)     Time: 5784-6962 PT Time Calculation (min) (ACUTE ONLY): 18 min  Charges:  $Therapeutic Activity: 23-37 mins                     9:20 AM, 01/02/18 Lonell Grandchild, MPT Physical Therapist with [facility] [phone] office [phone] mobile phone

## 2018-01-02 NOTE — Progress Notes (Signed)
IV removed, 2x2 gauze and paper tape applied to site.  Report called to Arbie Cookey, Production assistant, radio at [facility].  Patient awaiting transport by EMS to facility.

## 2018-01-02 NOTE — Clinical Social Work Placement (Signed)
   CLINICAL SOCIAL WORK PLACEMENT  NOTE  Date:  01/02/2018  Patient Details  Name: [name] MRN: [id number] Date of Birth: 04/30/1933  Clinical Social Work is seeking post-discharge placement for this patient at the [place] level of care (*CSW will initial, date and re-position this form in  chart as items are completed):  Yes   Patient/family provided with [facility] Department's list of facilities offering this level of care within the geographic area requested by the patient (or if unable, by the patient's family).  Yes   Patient/family informed of their freedom to choose among providers that offer the needed level of care, that participate in Medicare, Medicaid or managed care program needed by the patient, have an available bed and are willing to accept the patient.  Yes   Patient/family informed of [facility]'s ownership interest in [facility] and [facility], as well as of the fact that they are under no obligation to receive care at these facilities.  PASRR submitted to EDS on       PASRR number received on       Existing PASRR number confirmed on 01/02/18     FL2 transmitted to all facilities in geographic area requested by pt/family on 01/02/18     FL2 transmitted to all facilities within larger geographic area on       Patient informed that his/her managed care company has contracts with or will negotiate with certain facilities, including the following:        Yes   Patient/family informed of bed offers received.  Patient chooses bed at [facility]     Physician recommends and patient chooses bed at      Patient to be transferred to [facility] on 01/02/18.  Patient to be transferred to facility by [facility]     Patient family notified on 01/02/18 of transfer.  Name of family member notified:  Mrs. Orsak, spouse     PHYSICIAN       Additional Comment:  Patient going to room 118B.  Discharge clinicals  sent.  RN to call EMS.  LCSW signing off.   _______________________________________________ Ihor Gully, LCSW 01/02/2018, 3:03 PM

## 2018-01-02 NOTE — Progress Notes (Signed)
Physical Therapy Treatment Patient Details Name: [name] MRN: [id number] DOB: [birth year] Today's Date: 01/02/2018    History of Present Illness [name] is a 84 y.o. male with medical history significant for [place] fib, HTN, COPD chronic respiratory failure, dementia, frequent falls, who was brought to the ED via EMS with complaints of a fall that happened last night.  EMS was called in last night also but patient refused to come to the hospital.  But with increasing lower back pain patient presented to the ED today.  Patient lives at home with spouse who can no longer care for him.  Patient sustained a fall- witnessed last night when got up, was holding onto his walker, spouse was trying to get his wheelchair to him in a tight space, when trying to maneuver patient's legs could not hold him up resulting in the fall.     PT Comments    Patient presents in bed with head of bed raised and tolerated moving and scooting self to bedside with less pain after receiving pain medication.  Patient able to take a few steps at bedside to transfer to chair, declined to walk farther due back pain/discomfort and tolerated sitting up in chair after therapy.  Patient will benefit from continued physical therapy in hospital and recommended venue below to increase strength, balance, endurance for safe ADLs and gait.    Follow Up Recommendations  SNF;Supervision/Assistance - 24 hour     Equipment Recommendations  None recommended by PT    Recommendations for Other Services       Precautions / Restrictions Precautions Precautions: Fall Precaution Comments: L2 fracture, 8th, 96th right rib fractures Restrictions Weight Bearing Restrictions: No    Mobility  Bed Mobility Overal bed mobility: Needs Assistance Bed Mobility: Supine to Sit Rolling: Mod assist Sidelying to sit: Mod assist;Max assist Supine to sit: Min assist;Mod assist Sit to supine: Max assist   General bed mobility comments:  with head of bed raised  Transfers Overall transfer level: Needs assistance Equipment used: Rolling walker (2 wheeled) Transfers: Sit to/from [facility] Sit to Stand: Min assist Stand pivot transfers: Min assist       General transfer comment: slow labored movement  Ambulation/Gait Ambulation/Gait assistance: Mod assist Gait Distance (Feet): 3 Feet Assistive device: Rolling walker (2 wheeled) Gait Pattern/deviations: Decreased step length - right;Decreased step length - left;Decreased stride length Gait velocity: slow   General Gait Details: limited to 4-5 slow labored steps at bedside due to c/o fatigue and low back pain   Stairs             Wheelchair Mobility    Modified Rankin (Stroke Patients Only)       Balance Overall balance assessment: Needs assistance Sitting-balance support: Feet supported;No upper extremity supported Sitting balance-Leahy Scale: Good Sitting balance - Comments: fair/good supporting self with BUE   Standing balance support: Bilateral upper extremity supported;During functional activity Standing balance-Leahy Scale: Fair                              Cognition Arousal/Alertness: Awake/alert Behavior During Therapy: WFL for tasks assessed/performed Overall Cognitive Status: Within Functional Limits for tasks assessed                                        Exercises  General Comments        Pertinent Vitals/Pain Pain Assessment: Faces Faces Pain Scale: Hurts little more Pain Location: low back with movement Pain Descriptors / Indicators: Sharp;Discomfort;Grimacing Pain Intervention(s): Limited activity within patient's tolerance;Monitored during session;Patient requesting pain meds-RN notified    Home Living                      Prior Function            PT Goals (current goals can now be found in the care plan section) Acute Rehab PT Goals Patient Stated  Goal: return home PT Goal Formulation: With patient Time For Goal Achievement: 01/15/18 Potential to Achieve Goals: Good Progress towards PT goals: Progressing toward goals    Frequency    Min 3X/week      PT Plan Current plan remains appropriate    Co-evaluation              AM-PAC PT "6 Clicks" Daily Activity  Outcome Measure  Difficulty turning over in bed (including adjusting bedclothes, sheets and blankets)?: A Lot Difficulty moving from lying on back to sitting on the side of the bed? : A Lot Difficulty sitting down on and standing up from a chair with arms (e.g., wheelchair, bedside commode, etc,.)?: A Little Help needed moving to and from a bed to chair (including a wheelchair)?: A Little Help needed walking in hospital room?: A Lot Help needed climbing 3-5 steps with a railing? : A Lot 6 Click Score: 14    End of Session Equipment Utilized During Treatment: Oxygen Activity Tolerance: Patient tolerated treatment well;Patient limited by pain Patient left: in chair;with call bell/phone within reach;with chair alarm set Nurse Communication: Mobility status PT Visit Diagnosis: Unsteadiness on feet (R26.81);Other abnormalities of gait and mobility (R26.89);Muscle weakness (generalized) (M62.81)     Time: 8118-8677 PT Time Calculation (min) (ACUTE ONLY): 10 min  Charges:  $Therapeutic Activity: 8-22 mins                     12:29 PM, 01/02/18 Lonell Grandchild, MPT Physical Therapist with [facility] [phone] office [phone] mobile phone

## 2018-01-05 ENCOUNTER — Inpatient Hospital Stay: Payer: Medicare Other | Admitting: Family Medicine

## 2018-01-05 ENCOUNTER — Encounter

## 2018-02-09 ENCOUNTER — Ambulatory Visit: Payer: Medicare Other | Admitting: Family Medicine

## 2018-02-09 ENCOUNTER — Encounter: Payer: Self-pay | Admitting: Family Medicine

## 2018-02-09 VITALS — BP 120/70 | HR 101 | Temp 97.6°F | Wt 125.0 lb

## 2018-02-09 DIAGNOSIS — R296 Repeated falls: Secondary | ICD-10-CM

## 2018-02-09 DIAGNOSIS — N183 Chronic kidney disease, stage 3 unspecified: Secondary | ICD-10-CM

## 2018-02-09 DIAGNOSIS — E43 Unspecified severe protein-calorie malnutrition: Secondary | ICD-10-CM | POA: Diagnosis not present

## 2018-02-09 DIAGNOSIS — Z23 Encounter for immunization: Secondary | ICD-10-CM

## 2018-02-09 DIAGNOSIS — E876 Hypokalemia: Secondary | ICD-10-CM | POA: Diagnosis not present

## 2018-02-09 DIAGNOSIS — F0390 Unspecified dementia without behavioral disturbance: Secondary | ICD-10-CM | POA: Diagnosis not present

## 2018-02-09 LAB — BASIC METABOLIC PANEL
BUN: 18 mg/dL (ref 6–23)
CALCIUM: 9.3 mg/dL (ref 8.4–10.5)
CO2: 28 mEq/L (ref 19–32)
CREATININE: 0.85 mg/dL (ref 0.40–1.50)
Chloride: 102 mEq/L (ref 96–112)
GFR: 91.35 mL/min (ref >60.00)
GLUCOSE: 80 mg/dL (ref 70–99)
Potassium: 5.3 mEq/L — ABNORMAL HIGH (ref 3.5–5.1)
SODIUM: 139 meq/L (ref 135–145)

## 2018-02-09 NOTE — Patient Instructions (Signed)
Hold the Exelon to see if appetite improves any  IF potassium is normal would hold the potassium supplement  Consider nutritional supplement such as Ensure or Boost.

## 2018-02-09 NOTE — Progress Notes (Signed)
Subjective:     Patient ID: [name], male   DOB: January 03, 1933, 84 y.o.   MRN: [id number]  HPI Patient seen for hospital follow-up. He has multiple chronic problems including dementia, atrial fibrillation, hypertension, history of recurrent falls, protein calorie  malnutritional, adult failure to thrive, history of recurrent depression, diastolic heart failure, GERD.  He was admitted to hospital July 31 after fall at home. He had pubic ramus fracture on the right along with L2 compression deformity and right eighth and ninth rib fractures from previous falls. He was discharged to rehabilitation and there until a few days ago. He has unfortunately continue to lose weight. His weight is down from 146 pounds last May to current weight of 125 pounds.  He has dementia. He is on Exelon patch. Wife wonders if she can hold this to see if this helps appetite. He has a walker and usually ambulates with that.  Discharge potassium was 3.4. He takes furosemide only as needed for edema. Wife is not sure if she is giving him potassium regularly at home. No recent diarrhea. No vomiting. No chest pains. No fevers or chills.  Past Medical History:  Diagnosis Date  . Anemia   . ASD (atrial septal defect)    a. s/p repair 1983.  [name] BPH (benign prostatic hyperplasia)   . Cerebrovascular accident ([place]) [place]  . Chronic bronchitis ([place])    a. h/o resp failure in setting of bronchitis vs PNA 05/2012.  [name] Chronic diarrhea   . COPD (chronic obstructive pulmonary disease) ([place])   . Coronary artery disease    a. Nonobstructive by cath 2009 and 09/06/14  . Depression   . Dysphagia, unspecified(787.20)   . Erosive gastritis    H/o GIB felt secondary to this  . Esophagitis   . Falls frequently   . Gastric polyp   . Gastric ulcer   . GERD (gastroesophageal reflux disease)   . Hearing impairment   . Hiatal hernia   . History of upper gastrointestinal hemorrhage    dr. Maurene Capes, GI  . Hyperlipidemia   .  Hypertension   . Internal hemorrhoids   . LV dysfunction    a. EF 45-50% by echo 05/2012.  [name] Permanent atrial fibrillation (HCC)    INR followed at [facility].  [name] Vision problems    Past Surgical History:  Procedure Laterality Date  . ASD REPAIR  1983  . CARDIOVERSION     possibly 3 times, dr. brody/cooper  . CHOLECYSTECTOMY  1990s  . ENTEROSCOPY  05/13/2012   Procedure: ENTEROSCOPY;  Surgeon: Inda Castle, MD;  Location: [facility] ENDOSCOPY;  Service: Endoscopy;  Laterality: N/A;  . [place]  . LEFT HEART CATHETERIZATION WITH CORONARY ANGIOGRAM N/A 09/06/2014   Procedure: LEFT HEART CATHETERIZATION WITH CORONARY ANGIOGRAM;  Surgeon: Jettie Booze, MD;  Location: [facility] C[facility] LAB;  Service: Cardiovascular;  Laterality: N/A;  . partial small bowel resection  2005   ischemic?    reports that he quit smoking about 34 years ago. His smoking use included cigarettes. He smoked 0.00 packs per day for 0.00 years. He has never used smokeless tobacco. He reports that he does not drink alcohol or use drugs. family history includes Heart disease in his father and mother; Prostate cancer in his brother. Allergies  Allergen Reactions  . Lovenox [Enoxaparin Sodium] Swelling  . Tape Other (See Comments)    SKIN WILL TEAR IF TAPED (MUST BE LIGHT & BRIEF OR USE COBAN WRAP, PLEASE!!)  .  Penicillins Hives    Has patient had a PCN reaction causing immediate rash, facial/tongue/throat swelling, SOB or lightheadedness with hypotension: Yes Has patient had a PCN reaction causing severe rash involving mucus membranes or skin necrosis: No Has patient had a PCN reaction that required hospitalization: Unknown Has patient had a PCN reaction occurring within the last 10 years: No If all of the above answers are "NO", then may proceed with Cephalosporin use.      Review of Systems History is limited from patient secondary to severe dementia Obtained per wife. Poor appetite.  No fever,  No  agitation,  No vomiting, diarrhea or other concerns.      Objective:   Physical Exam  Constitutional:  Alert and cooperative very frail appearing 84 year old male in no distress  Cardiovascular:  Irregular rhythm with rate around 100.  Pulmonary/Chest:  Somewhat diminished breath sounds throughout. Pulse oximetry 94%  Musculoskeletal: He exhibits no edema.  Neurological: He is alert.       Assessment:     #1 history of frequent falls with recent pubic ramus fracture along with rib fractures from previous falls  #2 history of dementia-fairly advanced  #3 atrial fibrillation on Xarelto. There been long discussions with family about his risk for bleeding on Xarelto but wife has insisted that he stay on this.  #4 progressive weight loss with BMI of 17. Protein calorie malnutrition and adult failure to thrive  #5 mild hypokalemia      Plan:     -recheck basic metabolic panel -Walker use at all times -Discussed recommendations for nutritional supplements such as boost or ensure -flu vaccine given -Routine follow-up in one month and sooner as needed  [name] MD [facility] at [facility]

## 2018-02-18 ENCOUNTER — Ambulatory Visit: Payer: Medicare Other | Admitting: Gastroenterology

## 2018-02-24 ENCOUNTER — Telehealth: Payer: Self-pay | Admitting: Family Medicine

## 2018-02-24 ENCOUNTER — Other Ambulatory Visit: Payer: Self-pay

## 2018-02-24 DIAGNOSIS — R296 Repeated falls: Secondary | ICD-10-CM

## 2018-02-24 NOTE — Telephone Encounter (Signed)
Last OV 02/09/18, No future OV  Please advise.

## 2018-02-24 NOTE — Telephone Encounter (Signed)
Copied from [place] [id number]. Topic: Quick Communication - See Telephone Encounter >> Feb 24, 2018  4:15 PM Vernona Rieger wrote: CRM for notification. See Telephone encounter for: 02/24/18.  Patient's wife, [name] would like a prescription to be sent in for him to have a walker with a seat on it.  She called his insurance, it needs to go to [place], [place] [address]. Please advise.

## 2018-02-24 NOTE — Telephone Encounter (Signed)
Order has been faxed to [facility].  Called patient and left a voice message to call our office back since okay to leave a voice message was not on the DPR.  CRM Created.

## 2018-02-24 NOTE — Telephone Encounter (Signed)
OK to send DME rx for walker with a seat.

## 2018-03-24 ENCOUNTER — Other Ambulatory Visit: Payer: Self-pay

## 2018-03-24 MED ORDER — RIVAROXABAN 20 MG PO TABS: ORAL_TABLET | ORAL | 0 refills | Status: DC

## 2018-04-06 ENCOUNTER — Encounter: Payer: Self-pay | Admitting: Family Medicine

## 2018-04-06 ENCOUNTER — Other Ambulatory Visit: Payer: Self-pay | Admitting: Family Medicine

## 2018-04-06 ENCOUNTER — Other Ambulatory Visit: Payer: Self-pay

## 2018-04-06 ENCOUNTER — Ambulatory Visit: Payer: Medicare Other | Admitting: Family Medicine

## 2018-04-06 VITALS — BP 112/76 | HR 80 | Temp 97.9°F | Ht 72.0 in | Wt 132.6 lb

## 2018-04-06 DIAGNOSIS — E43 Unspecified severe protein-calorie malnutrition: Secondary | ICD-10-CM | POA: Diagnosis not present

## 2018-04-06 DIAGNOSIS — E785 Hyperlipidemia, unspecified: Secondary | ICD-10-CM | POA: Diagnosis not present

## 2018-04-06 DIAGNOSIS — I10 Essential (primary) hypertension: Secondary | ICD-10-CM

## 2018-04-06 LAB — LIPID PANEL
Cholesterol: 136 mg/dL (ref 0–200)
HDL: 60.5 mg/dL (ref >39.00)
LDL Cholesterol: 53 mg/dL (ref 0–99)
NONHDL: 75.4
Total CHOL/HDL Ratio: 2
Triglycerides: 112 mg/dL (ref 0.0–149.0)
VLDL: 22.4 mg/dL (ref 0.0–40.0)

## 2018-04-06 LAB — BASIC METABOLIC PANEL
BUN: 17 mg/dL (ref 6–23)
CALCIUM: 9.4 mg/dL (ref 8.4–10.5)
CO2: 30 mEq/L (ref 19–32)
Chloride: 103 mEq/L (ref 96–112)
Creatinine, Ser: 0.85 mg/dL (ref 0.40–1.50)
GFR: 91.31 mL/min (ref >60.00)
GLUCOSE: 93 mg/dL (ref 70–99)
Potassium: 4.2 mEq/L (ref 3.5–5.1)
Sodium: 141 mEq/L (ref 135–145)

## 2018-04-06 LAB — HEPATIC FUNCTION PANEL
ALK PHOS: 83 U/L (ref 39–117)
ALT: 14 U/L (ref 0–53)
AST: 21 U/L (ref 0–37)
Albumin: 4.2 g/dL (ref 3.5–5.2)
BILIRUBIN DIRECT: 0.1 mg/dL (ref 0.0–0.3)
BILIRUBIN TOTAL: 0.6 mg/dL (ref 0.2–1.2)
Total Protein: 7 g/dL (ref 6.0–8.3)

## 2018-04-06 NOTE — Progress Notes (Signed)
Subjective:     Patient ID: [name], male   DOB: 1932-11-17, 84 y.o.   MRN: [id number]  HPI Patient has multiple chronic problems including history of dementia, hypertension, CAD, atrial fibrillation, chronic diastolic heart failure, remote history of pulmonary emboli, no history of gastric ulcer, GERD, adult failure to thrive and history of protein calorie malnutrition.    He has fortunately gained some weight since last visit.  Weight has gone from 125 pounds up to 132 pounds.  We have placed him on mirtazapine which seems to be helping his appetite.  We also discontinue Exelon patch and wife thinks his appetite took off after that.  They are not clear if he is taking the Protonix  Ambulating with walker.  No falls since last visit.  Mood stable.    Wt Readings from Last 3 Encounters:  04/06/18 132 lb 9.6 oz (60.1 kg)  02/09/18 125 lb (56.7 kg)  12/31/17 147 lb (66.7 kg)     Past Medical History:  Diagnosis Date  . Anemia   . ASD (atrial septal defect)    a. s/p repair 1983.  [name] BPH (benign prostatic hyperplasia)   . Cerebrovascular accident ([place]) [place]  . Chronic bronchitis ([place])    a. h/o resp failure in setting of bronchitis vs PNA 05/2012.  [name] Chronic diarrhea   . COPD (chronic obstructive pulmonary disease) ([place])   . Coronary artery disease    a. Nonobstructive by cath 2009 and 09/06/14  . Depression   . Dysphagia, unspecified(787.20)   . Erosive gastritis    H/o GIB felt secondary to this  . Esophagitis   . Falls frequently   . Gastric polyp   . Gastric ulcer   . GERD (gastroesophageal reflux disease)   . Hearing impairment   . Hiatal hernia   . History of upper gastrointestinal hemorrhage    dr. Maurene [name], GI  . Hyperlipidemia   . Hypertension   . Internal hemorrhoids   . LV dysfunction    a. EF 45-50% by echo 05/2012.  [name] Permanent atrial fibrillation    [facility] at [facility].  [name] Vision problems    Past Surgical History:  Procedure Laterality Date  . ASD  REPAIR  1983  . CARDIOVERSION     possibly 3 times, dr. brody/[name]  . CHOLECYSTECTOMY  1990s  . ENTEROSCOPY  05/13/2012   Procedure: ENTEROSCOPY;  Surgeon: Darrell Castle, MD;  Location: [facility] ENDOSCOPY;  Service: Endoscopy;  Laterality: N/A;  . [place]  . LEFT HEART CATHETERIZATION WITH CORONARY ANGIOGRAM N/A 09/06/2014   Procedure: LEFT HEART CATHETERIZATION WITH CORONARY ANGIOGRAM;  Surgeon: Darrell Booze, MD;  Location: [facility] CATH LAB;  Service: Cardiovascular;  Laterality: N/A;  . partial small bowel resection  2005   ischemic?    reports that he quit smoking about 34 years ago. His smoking use included cigarettes. He smoked 0.00 packs per day for 0.00 years. He has never used smokeless tobacco. He reports that he does not drink alcohol or use drugs. family history includes Heart disease in his father and mother; Prostate cancer in his brother. Allergies  Allergen Reactions  . Lovenox [Enoxaparin Sodium] Swelling  . Tape Other (See Comments)    SKIN WILL TEAR IF TAPED (MUST BE LIGHT & BRIEF OR USE COBAN WRAP, PLEASE!!)  . Penicillins Hives    Has patient had a PCN reaction causing immediate rash, facial/tongue/throat swelling, SOB or lightheadedness with hypotension: Yes Has patient had a  PCN reaction causing severe rash involving mucus membranes or skin necrosis: No Has patient had a PCN reaction that required hospitalization: Unknown Has patient had a PCN reaction occurring within the last 10 years: No If all of the above answers are "NO", then may proceed with Cephalosporin use.      Review of Systems  Constitutional: Negative for chills and fever.  Respiratory: Negative for shortness of breath.   Cardiovascular: Negative for chest pain.  Gastrointestinal: Negative for abdominal pain, diarrhea, nausea and vomiting.  Genitourinary: Negative for dysuria.  Neurological: Negative for dizziness and headaches.       Objective:   Physical Exam   Constitutional:  Alert, thin in no distress.  Cardiovascular: Normal rate.  Pulmonary/Chest: Effort normal and breath sounds normal.  Musculoskeletal: He exhibits no edema.  Neurological: He is alert.       Assessment:     #1 dementia.  Patient intolerant of Exelon  #2 hypertension stable and at goal  #3 protein calorie malnutrition  #4 hyperlipidemia in a patient with history of CAD  #5 history of chronic atrial fibrillation    Plan:     -Flu vaccine already given -Check labs with basic metabolic panel, lipid panel, hepatic panel -Continue current medications -We will plan routine follow-up in about 4 months and sooner as needed  Darrell Post MD [facility]

## 2018-04-07 NOTE — Telephone Encounter (Signed)
Refill for one year 

## 2018-04-07 NOTE — Telephone Encounter (Signed)
Last OV Yesterday, No future OV  Last filled 09/01/17, # 90 with 1 refill

## 2018-05-22 ENCOUNTER — Other Ambulatory Visit: Payer: Self-pay

## 2018-05-22 MED ORDER — MIRTAZAPINE 15 MG PO TABS: 15.0000 mg | ORAL_TABLET | Freq: Every day | ORAL | 5 refills | Status: DC

## 2018-06-12 ENCOUNTER — Other Ambulatory Visit: Payer: Self-pay | Admitting: Family Medicine

## 2018-06-18 ENCOUNTER — Other Ambulatory Visit: Payer: Self-pay | Admitting: Family Medicine

## 2018-06-29 ENCOUNTER — Ambulatory Visit: Payer: Medicare Other | Admitting: Physician Assistant

## 2018-06-29 ENCOUNTER — Encounter: Payer: Self-pay | Admitting: Physician Assistant

## 2018-06-29 ENCOUNTER — Other Ambulatory Visit (INDEPENDENT_AMBULATORY_CARE_PROVIDER_SITE_OTHER): Payer: Medicare Other

## 2018-06-29 VITALS — BP 144/80 | HR 82 | Ht 71.0 in | Wt 136.0 lb

## 2018-06-29 DIAGNOSIS — K625 Hemorrhage of anus and rectum: Secondary | ICD-10-CM

## 2018-06-29 DIAGNOSIS — K219 Gastro-esophageal reflux disease without esophagitis: Secondary | ICD-10-CM | POA: Diagnosis not present

## 2018-06-29 DIAGNOSIS — K5900 Constipation, unspecified: Secondary | ICD-10-CM

## 2018-06-29 LAB — CBC WITH DIFFERENTIAL/PLATELET
BASOS PCT: 0.4 % (ref 0.0–3.0)
Basophils Absolute: 0 10*3/uL (ref 0.0–0.1)
EOS ABS: 0.1 10*3/uL (ref 0.0–0.7)
EOS PCT: 1.9 % (ref 0.0–5.0)
HCT: 43.2 % (ref 39.0–52.0)
HEMOGLOBIN: 14.5 g/dL (ref 13.0–17.0)
LYMPHS ABS: 0.9 10*3/uL (ref 0.7–4.0)
Lymphocytes Relative: 15.5 % (ref 12.0–46.0)
MCHC: 33.5 g/dL (ref 30.0–36.0)
MCV: 90.7 fl (ref 78.0–100.0)
MONO ABS: 0.6 10*3/uL (ref 0.1–1.0)
Monocytes Relative: 9.8 % (ref 3.0–12.0)
Neutro Abs: 4.4 10*3/uL (ref 1.4–7.7)
Neutrophils Relative %: 72.4 % (ref 43.0–77.0)
PLATELETS: 194 10*3/uL (ref 150.0–400.0)
RBC: 4.76 Mil/uL (ref 4.22–5.81)
RDW: 14.2 % (ref 11.5–15.5)
WBC: 6 10*3/uL (ref 4.0–10.5)

## 2018-06-29 MED ORDER — PANTOPRAZOLE SODIUM 40 MG PO TBEC: 40.0000 mg | DELAYED_RELEASE_TABLET | Freq: Every day | ORAL | 3 refills | Status: DC

## 2018-06-29 NOTE — Patient Instructions (Signed)
If you are age 83 or older, your body mass index should be between 23-30. Your Body mass index is 18.97 kg/m. If this is out of the aforementioned range listed, please consider follow up with your Primary Care Provider.  If you are age 62 or younger, your body mass index should be between 19-25. Your Body mass index is 18.97 kg/m. If this is out of the aformentioned range listed, please consider follow up with your Primary Care Provider.   Your provider has requested that you go to the basement level for lab work before leaving today. Press "B" on the elevator. The lab is located at the first door on the left as you exit the elevator. CBC w/Diff  We have sent the following medications to your pharmacy for you to pick up at your convenience: Pantoprazole  Start Miralax - 1 capful daily. (over-the-counter)  Follow up as needed.  Thank you for choosing me and [facility].    Ellouise Newer, PA-C

## 2018-06-29 NOTE — Progress Notes (Signed)
Chief Complaint: Med refill, blood in stool  HPI:    [name] is an 85 year old male with a past medical history as listed below including atrial septal defect repair in 1983, CVA, COPD, CAD (last echo 01/12/2016 with an LVEF of 55-60%), GERD, A. fib and maintained on Xarelto for pulmonary embolism, known to Dr. Silverio Decamp, presents to clinic today for complaint of blood in his stool and medication refill.    05/09/2016 office visit with me for Hemoccult positive stool anemia and fatigue.  At that time patient had had no recent iron studies, his hemoglobin was low and he was fecal occult positive.  We discussed an EGD and colonoscopy but the patient wanted to delay these if possible.  I also discussed further labs and ordered an anemia profile including iron studies as well as B12 and folate.  He wanted to remain on iron for 3 to 4 weeks and a recheck of his labs prior to endoscopic evaluation.  It was discussed that if it does not improve then he can have an EGD and colonoscopy need to hold his Xarelto for 2 days prior.    01/02/2018 hemoglobin 13.3.  CBC otherwise normal.  04/06/2018 hepatic function panel, lipid panel and basic metabolic panel were normal.    Today, patient presents clinic accompanied by his wife who is his sole caretaker and provides much of his history.  She describes that over the past few months the patient has been constipated with a bowel movement maybe once every 3 days, about 2 weeks ago he was straining to pass a stool and she did see some bright red blood, she has seen none since then.  Patient is maintained on Florastor twice daily but is not on a laxative.  He denies any complaints today including fatigue, shortness of breath or palpitations.    Patient's family also requests a refill of his Pantoprazole 40 mg daily which does control his reflux.    Denies fever, chills, weight loss, anorexia, nausea, vomiting, heartburn, reflux or symptoms that awaken him from sleep.       Past Medical History:  Diagnosis Date  . Anemia   . ASD (atrial septal defect)    a. s/p repair 1983.  [name] BPH (benign prostatic hyperplasia)   . Cerebrovascular accident ([place]) [place]  . Chronic bronchitis ([place])    a. h/o resp failure in setting of bronchitis vs PNA 05/2012.  [name] Chronic diarrhea   . COPD (chronic obstructive pulmonary disease) ([place])   . Coronary artery disease    a. Nonobstructive by cath 2009 and 09/06/14  . Depression   . Dysphagia, unspecified(787.20)   . Erosive gastritis    H/o GIB felt secondary to this  . Esophagitis   . Falls frequently   . Gastric polyp   . Gastric ulcer   . GERD (gastroesophageal reflux disease)   . Hearing impairment   . Hiatal hernia   . History of upper gastrointestinal hemorrhage    dr. Maurene Capes, GI  . Hyperlipidemia   . Hypertension   . Internal hemorrhoids   . LV dysfunction    a. EF 45-50% by echo 05/2012.  [name] Permanent atrial fibrillation    [facility].  [name] Vision problems     Past Surgical History:  Procedure Laterality Date  . ASD REPAIR  1983  . CARDIOVERSION     possibly 3 times, dr. brody/cooper  . CHOLECYSTECTOMY  1990s  . ENTEROSCOPY  05/13/2012   Procedure: ENTEROSCOPY;  Surgeon: Inda Castle, MD;  Location: [facility];  Service: Endoscopy;  Laterality: N/A;  . [place]  . LEFT HEART CATHETERIZATION WITH CORONARY ANGIOGRAM N/A 09/06/2014   Procedure: LEFT HEART CATHETERIZATION WITH CORONARY ANGIOGRAM;  Surgeon: Jettie Booze, MD;  Location: [facility] CATH LAB;  Service: Cardiovascular;  Laterality: N/A;  . partial small bowel resection  2005   ischemic?    Current Outpatient Medications  Medication Sig Dispense Refill  . buPROPion (WELLBUTRIN XL) 150 MG 24 hr tablet Take 1 tablet (150 mg total) by mouth daily. 90 tablet 3  . clotrimazole-betamethasone (LOTRISONE) cream APPLY TO AFFECTED AREAS TWICE A DAY (Patient taking differently: APPLY TO AFFECTED AREAS as needed) 45 g 0  .  dutasteride (AVODART) 0.5 MG capsule TAKE (1) CAPSULE DAILY 30 capsule 2  . furosemide (LASIX) 20 MG tablet Take 1 tablet (20 mg total) by mouth as needed. For edema 30 tablet 6  . ipratropium (ATROVENT) 0.02 % nebulizer solution NEBULIZE 1 VAIL 4 TIMES A DAY AS DIRECTED (Patient taking differently: NEBULIZE 1 VAIL  A DAY AS needed) 312.5 mL 0  . isosorbide mononitrate (IMDUR) 30 MG 24 hr tablet Take 1 tablet (30 mg total) by mouth daily. 90 tablet 3  . levalbuterol (XOPENEX) 0.63 MG/3ML nebulizer solution NEBULIZE 1 VIAL (WITH IPRATROPIUM) TWICE A DAY AS NEEDED. MAY USE EXTR A EVERY 4 HOURS IF NEEDED 216 mL 0  . mirtazapine (REMERON) 15 MG tablet Take 1 tablet (15 mg total) by mouth daily. 30 tablet 5  . mupirocin ointment (BACTROBAN) 2 % Apply 1 application topically 2 (two) times daily.    . nitroGLYCERIN (NITROSTAT) 0.4 MG SL tablet Place 1 tablet (0.4 mg total) under the tongue every 5 (five) minutes x 3 doses as needed for chest pain. 25 tablet 3  . pantoprazole (PROTONIX) 40 MG tablet Take 1 tablet (40 mg total) by mouth daily. (Patient not taking: Reported on 04/06/2018) 90 tablet 0  . saccharomyces boulardii (FLORASTOR) 250 MG capsule Take 250 mg by mouth 2 (two) times daily.    . simvastatin (ZOCOR) 20 MG tablet TAKE ONE TABLET AT BEDTIME 90 tablet 0  . vitamin B-12 (CYANOCOBALAMIN) 500 MCG tablet Take 500 mcg by mouth daily.    Alveda Reasons 20 MG TABS tablet TAKE 1 TABLET DAILY WITH SUPPER 90 tablet 0   No current facility-administered medications for this visit.     Allergies as of 06/29/2018 - Review Complete 04/06/2018  Allergen Reaction Noted  . Lovenox [enoxaparin sodium] Swelling 02/02/2016  . Tape Other (See Comments) 01/11/2016  . Penicillins Hives     Family History  Problem Relation Age of Onset  . Heart disease Mother   . Heart disease Father   . Prostate cancer Brother     Social History   Socioeconomic History  . Marital status: Married    Spouse name: Not on  file  . Number of children: Not on file  . Years of education: Not on file  . Highest education level: Not on file  Occupational History  . Not on file  Social Needs  . Financial resource strain: Not on file  . Food insecurity:    Worry: Not on file    Inability: Not on file  . Transportation needs:    Medical: Not on file    Non-medical: Not on file  Tobacco Use  . Smoking status: Former Smoker    Packs/day: 0.00    Years: 0.00  Pack years: 0.00    Types: Cigarettes    Last attempt to quit: 06/13/1983    Years since quitting: 35.0  . Smokeless tobacco: Never Used  Substance and Sexual Activity  . Alcohol use: No  . Drug use: No  . Sexual activity: Not on file  Lifestyle  . Physical activity:    Days per week: Not on file    Minutes per session: Not on file  . Stress: Not on file  Relationships  . Social connections:    Talks on phone: Not on file    Gets together: Not on file    Attends religious service: Not on file    Active member of club or organization: Not on file    Attends meetings of clubs or organizations: Not on file    Relationship status: Not on file  . Intimate partner violence:    Fear of current or ex partner: Not on file    Emotionally abused: Not on file    Physically abused: Not on file    Forced sexual activity: Not on file  Other Topics Concern  . Not on file  Social History Narrative   Lives in [place] with wife.  Several falls recently, 4 in the last month.        [name]  [phone]                Review of Systems:    Constitutional: No weight loss, fever or chills Cardiovascular: No chest pain Respiratory: No SOB  Gastrointestinal: See HPI and otherwise negative   Physical Exam:  Vital signs: BP (!) 144/80   Pulse 82   Ht 5\' 11"  (1.803 m)   Wt 136 lb (61.7 kg)   BMI 18.97 kg/m   Constitutional:   Pleasant elderly, frail appearing, Caucasian male appears to be in NAD, Well developed, Well nourished, alert and  cooperative Respiratory: Respirations even and unlabored. Lungs clear to auscultation bilaterally.   No wheezes, crackles, or rhonchi.  Cardiovascular: Normal S1, S2. No MRG. Regular rate and rhythm. No peripheral edema, cyanosis or pallor.  Gastrointestinal:  Soft, nondistended, nontender. No rebound or guarding. Normal bowel sounds. No appreciable masses or hepatomegaly. Rectal:  Not performed.  Psychiatric: Demonstrates good judgement and reason without abnormal affect or behaviors.  MOST RECENT LABS AND IMAGING: CBC    Component Value Date/Time   WBC 5.9 01/02/2018 0523   RBC 4.45 01/02/2018 0523   HGB 13.3 01/02/2018 0523   HCT 41.3 01/02/2018 0523   PLT 144 (L) 01/02/2018 0523   MCV 92.8 01/02/2018 0523   MCH 29.9 01/02/2018 0523   MCHC 32.2 01/02/2018 0523   RDW 13.7 01/02/2018 0523   LYMPHSABS 0.5 (L) 12/31/2017 1704   MONOABS 0.8 12/31/2017 1704   EOSABS 0.0 12/31/2017 1704   BASOSABS 0.0 12/31/2017 1704    CMP     Component Value Date/Time   NA 141 04/06/2018 1514   K 4.2 04/06/2018 1514   CL 103 04/06/2018 1514   CO2 30 04/06/2018 1514   GLUCOSE 93 04/06/2018 1514   BUN 17 04/06/2018 1514   CREATININE 0.85 04/06/2018 1514   CREATININE 1.01 01/24/2017 1548   CALCIUM 9.4 04/06/2018 1514   PROT 7.0 04/06/2018 1514   ALBUMIN 4.2 04/06/2018 1514   AST 21 04/06/2018 1514   ALT 14 04/06/2018 1514   ALKPHOS 83 04/06/2018 1514   BILITOT 0.6 04/06/2018 1514   GFRNONAA >60 01/02/2018 0523   GFRAA >60 01/02/2018 5681  Assessment: 1.  Constipation: Worse over the past few months with a bowel movement every 3 days or so 2.  Rectal bleeding: With above, most likely hemorrhoidal 3.  GERD: Controlled on Pantoprazole 40 mg daily, patient needs a refill  Plan: 1.  Ordered CBC 2.  Refilled Pantoprazole 40 mg daily, 30-60 minutes before breakfast #90 with 3 refills 3.  Recommend the patient start daily MiraLAX.  Discussed that they can add this to the patient's coffee  in the morning as he does not like to drink water 4.  Explained that if the MiraLAX is not helping after a week they can try decreasing Florastor to once daily to see if this helps.  Can also increase MiraLAX in the future. 5.  Patient to follow in clinic per recommendations after CBC above and/or if he has continued symptoms.  Ellouise Newer, PA-C [facility] 06/29/2018, 1:35 PM  Cc: [name], MD

## 2018-07-08 NOTE — Progress Notes (Signed)
Reviewed and agree with documentation and assessment and plan. K. Veena Nandigam , MD   

## 2018-07-31 ENCOUNTER — Other Ambulatory Visit: Payer: Self-pay | Admitting: Family Medicine

## 2018-09-15 ENCOUNTER — Other Ambulatory Visit: Payer: Self-pay | Admitting: Family Medicine

## 2018-09-15 MED ORDER — LEVALBUTEROL HCL 0.63 MG/3ML IN NEBU: INHALATION_SOLUTION | RESPIRATORY_TRACT | 0 refills | Status: DC

## 2018-09-15 NOTE — Telephone Encounter (Signed)
Requested medication (s) are due for refill today:   Xopenex 0.63 nebulizer  LR 07/04/17  29ml  0 refills.                                                                                    Imdur 30 mg 24 hr  LR 08/04/17  #90  Refills 3  Requested medication (s) are on the active medication list:   Yes  Future visit scheduled:   No  Saw Dr. Elease Hashimoto 04/06/18.   Last ordered: See first line above.   Forwarded to Dr. Elease Hashimoto for review.  Requested Prescriptions  Pending Prescriptions Disp Refills   levalbuterol (XOPENEX) 0.63 MG/3ML nebulizer solution 216 mL 0     Pulmonology:  Beta Agonists Failed - 09/15/2018  9:08 AM      Failed - One inhaler should last at least one month. If the patient is requesting refills earlier, contact the patient to check for uncontrolled symptoms.      Passed - Valid encounter within last 12 months    Recent Outpatient Visits          5 months ago Severe protein-calorie malnutrition ([place])   Therapist, music at [facility], Alinda Sierras, MD   7 months ago Hypokalemia   Therapist, music at [facility], Alinda Sierras, MD   11 months ago Failure to thrive in adult   Therapist, music at [facility], Alinda Sierras, MD   1 year ago Skin tear of left hand without complication, initial Education administrator at [facility] [employer], Alinda Sierras, MD   1 year ago Essential hypertension   Therapist, music at [facility] [employer], Alinda Sierras, MD            isosorbide mononitrate (IMDUR) 30 MG 24 hr tablet 90 tablet 3    Sig: Take 1 tablet (30 mg total) by mouth daily.     Cardiovascular:  Nitrates Failed - 09/15/2018  9:08 AM      Failed - Last BP in normal range    BP Readings from Last 1 Encounters:  06/29/18 (!) 144/80         Passed - Last Heart Rate in normal range    Pulse Readings from Last 1 Encounters:  06/29/18 82         Passed - Valid encounter within last 12 months    Recent Outpatient Visits          5 months ago  Severe protein-calorie malnutrition ([place])   Therapist, music at [facility], Alinda Sierras, MD   7 months ago Hypokalemia   Therapist, music at [facility] [employer], Alinda Sierras, MD   11 months ago Failure to thrive in adult   Therapist, music at [facility] [employer], Alinda Sierras, MD   1 year ago Skin tear of left hand without complication, initial Education administrator at [facility] [employer], Alinda Sierras, MD   1 year ago Essential hypertension   Therapist, music at [facility] [employer], Alinda Sierras, MD

## 2018-09-17 ENCOUNTER — Other Ambulatory Visit: Payer: Self-pay | Admitting: Internal Medicine

## 2018-09-19 ENCOUNTER — Other Ambulatory Visit: Payer: Self-pay | Admitting: Family Medicine

## 2018-09-21 NOTE — Telephone Encounter (Signed)
Patient has a follow up telephone visit at 10 am on 09/22/18

## 2018-09-22 ENCOUNTER — Other Ambulatory Visit: Payer: Self-pay

## 2018-09-22 ENCOUNTER — Ambulatory Visit (INDEPENDENT_AMBULATORY_CARE_PROVIDER_SITE_OTHER): Payer: Medicare Other | Admitting: Family Medicine

## 2018-09-22 DIAGNOSIS — N183 Chronic kidney disease, stage 3 unspecified: Secondary | ICD-10-CM

## 2018-09-22 DIAGNOSIS — I5032 Chronic diastolic (congestive) heart failure: Secondary | ICD-10-CM

## 2018-09-22 DIAGNOSIS — I4891 Unspecified atrial fibrillation: Secondary | ICD-10-CM | POA: Diagnosis not present

## 2018-09-22 DIAGNOSIS — I159 Secondary hypertension, unspecified: Secondary | ICD-10-CM | POA: Diagnosis not present

## 2018-09-22 DIAGNOSIS — R627 Adult failure to thrive: Secondary | ICD-10-CM

## 2018-09-22 DIAGNOSIS — F339 Major depressive disorder, recurrent, unspecified: Secondary | ICD-10-CM

## 2018-09-22 DIAGNOSIS — E43 Unspecified severe protein-calorie malnutrition: Secondary | ICD-10-CM

## 2018-09-22 DIAGNOSIS — N400 Enlarged prostate without lower urinary tract symptoms: Secondary | ICD-10-CM

## 2018-09-22 MED ORDER — DUTASTERIDE 0.5 MG PO CAPS: ORAL_CAPSULE | ORAL | 3 refills | Status: DC

## 2018-09-22 MED ORDER — RIVAROXABAN 20 MG PO TABS: ORAL_TABLET | ORAL | 3 refills | Status: DC

## 2018-09-22 MED ORDER — ISOSORBIDE MONONITRATE ER 30 MG PO TB24: 30.0000 mg | ORAL_TABLET | Freq: Every day | ORAL | 3 refills | Status: DC

## 2018-09-22 MED ORDER — MIRTAZAPINE 15 MG PO TABS: 15.0000 mg | ORAL_TABLET | Freq: Every day | ORAL | 3 refills | Status: DC

## 2018-09-22 NOTE — Progress Notes (Signed)
Patient ID: [name], male   DOB: July 23, 1932, 85 y.o.   MRN: [id number]  Virtual Visit via Telephone Note  I connected with Kennieth Rad on 09/22/18 at 10:00 AM EDT by telephone and verified that I am speaking with the correct person using two identifiers.   I discussed the limitations, risks, security and privacy concerns of performing an evaluation and management service by telephone and the availability of in person appointments. I also discussed with the patient that there may be a patient responsible charge related to this service. The patient expressed understanding and agreed to proceed.  Location patient: home Location provider: work or home office Participants present for the call: patient, provider Patient did not have a visit in the prior 7 days to address this/these issue(s).   History of Present Illness: Patient has multiple chronic problems including history of CAD, hypertension, atrial fibrillation, cerebrovascular disease, chronic diastolic heart failure, history of pulmonary emboli, COPD, remote history of gastric ulcer, GERD, history of esophageal stricture, hyperlipidemia, adult failure to thrive, history of recurrent depression.  We conducted this follow-up with assistance of his wife as patient has significant cognitive impairment.  She had requested refills of Xarelto.  As above, he has history of atrial fibrillation as well as history of pulmonary emboli.  He has had remote history of upper GI bleed but has been very stable in recent years.  Compliant with therapy.  History of BPH.  He takes Avodart.  Wife states his urine flow is been good.  No significant nocturia  History of CAD.  Requesting refills of Imdur.  Wife would like to get this for 90 days if possible.  He denies any recent chest pains.  History of recurrent depression.  Currently doing well with regimen of Wellbutrin and Remeron.  She states he is sleeping well.  Appetite is good and his weight is  stable.  His mood seems to be fair.  Denies any recent falls.   Observations/Objective: Patient sounds cheerful and well on the phone. I do not appreciate any SOB. Speech and thought processing are grossly intact. Patient reported vitals:  Assessment and Plan:  #1 history of chronic atrial fibrillation as well as recurrent pulmonary emboli-stable on Xarelto -Refilled Xarelto for 1 year  #2 history of BPH-stable symptomatically -Refill Avodart for 1 year  #3 history of CAD.  Stable symptomatically -Refill Imdur for 90 days with 3 refills.  Continue close follow-up with cardiology  #4 history of adult failure to thrive and recurrent depression.  Wife states he is very stable at this time with good appetite and is sleeping well. -Refill Remeron 15 mg nightly which he seems to respond favorably to   Follow Up Instructions:  -We recommended that he follow-up by this Fall for further labs and reassessment   I did not refer this patient for an OV in the next 24 hours for this/these issue(s).  I discussed the assessment and treatment plan with the patient. The patient was provided an opportunity to ask questions and all were answered. The patient agreed with the plan and demonstrated an understanding of the instructions.   The patient was advised to call back or seek an in-person evaluation if the symptoms worsen or if the condition fails to improve as anticipated.  I provided 25 minutes of non-face-to-face time during this encounter of which greater than 50% spent discussing medications, reviewing most recent labs, reviewing his multiple chronic medical conditions   Carolann Littler, MD

## 2019-01-22 ENCOUNTER — Telehealth: Payer: Self-pay | Admitting: *Deleted

## 2019-01-22 NOTE — Telephone Encounter (Signed)
..      COVID-19 Pre-Screening Questions:  . In the past 7 to 10 days have you had a cough,  shortness of breath, headache, congestion, fever (100 or greater) body aches, chills, sore throat, or sudden loss of taste or sense of smell?NO . Have you been around anyone with known Covid 19?NO . Have you been around anyone who is awaiting Covid 19 test results in the past 7 to 10 days?NO . Have you been around anyone who has been exposed to Covid 19, or has mentioned symptoms of Covid 19 within the past 7 to 10 days?NO  Patient and wife answered NO to all questions. I made them aware of our mask policy and to not arrive any sooner than 15 minutes prior to appt time. Wife will need to come upstairs with patient. Wife verbalized understanding and agrees with this plan.  If you have any concerns/questions about symptoms patients report during screening (either on the phone or at threshold). Contact the provider seeing the patient or DOD for further guidance.  If neither are available contact a member of the leadership team.

## 2019-01-23 NOTE — Progress Notes (Signed)
Cardiology Office Note Date:  01/25/2019  Patient ID:  Darrell, [name] Apr 27, 1933, MRN [id number] PCP:  [name], MD  Electrophysiologist:  Dr. Rayann Heman     Chief Complaint: overdue annual EP visit  History of Present Illness: [name] is a 85 y.o. male with history of COPD, ASD repair (1993), non-obstructive CAD by cath 2009 and 2016, GERD, HTN, HLD, GERD, h/o GIB, CVA, dementia, permanent AFib, PE in 2017 his warfarin changed to xarelto.  He comes in today to be seen for Dr. Rayann Heman.  Last seen by him march 2019, at that time doing well, mentioned some CP that seemed to correlate with taking his meds on an empty stomach, his BP elevated, discussed adding amlodipine for antianginal/HTN though pt declined, also discussed ischemic w/u testing though he also declined this.  He also discussed/noting bifascicular block avoiding aggressive AV nodal blocking agents.  Planned for annual APP visits.  April 2019 he was hospitalized with CP, failure to thrive, general functional decline.  He was confused, noting h/o dementia, CP was unclear though not active, cardiology consulted recommended no ischemic w/u, palliative was consulted though pt did not meed Hospice requirements and family not ready for this  Aug 2019 hospitalized after a fall, pubic rami fracture on the right side and is noted to have prior L2 compression deformity as well as right eighth and ninth rib fractures from previous falls. It seems stopping his xarelto was recommended though ?family wanted it continued.  He was DNR this admission and discharged to SNF.   The patient is accompanied by his wife.  She does much/most of the talking.  The patient answers some, though defers to his wife on most answers.  She states tha in the last couple years he has slowed significantly.  He lives at home, she mentions trying to avoid SNF for as long as possible.  He has not had further falls since his last hospital stay.  They have  had virtual visits with PMD since COVID, due for an in-clinic visit, no appointment yet. The patient denies any CP, palpitations or SOB.  His wife concurs, states they would not have made the appointment if not called that he was due to come in.  No dizzy spells, no near syncope or syncope.  He walsks with the aid of a walker.  They deny any bleeding or signs of bleeding on Xarelto   Past Medical History:  Diagnosis Date  . Anemia   . ASD (atrial septal defect)    a. s/p repair 1983.  [name] BPH (benign prostatic hyperplasia)   . Cerebrovascular accident ([place]) [place]  . Chronic bronchitis ([place])    a. h/o resp failure in setting of bronchitis vs PNA 05/2012.  [name] Chronic diarrhea   . COPD (chronic obstructive pulmonary disease) ([place])   . Coronary artery disease    a. Nonobstructive by cath 2009 and 09/06/14  . Depression   . Dysphagia, unspecified(787.20)   . Erosive gastritis    H/o GIB felt secondary to this  . Esophagitis   . Falls frequently   . Gastric polyp   . Gastric ulcer   . GERD (gastroesophageal reflux disease)   . Hearing impairment   . Hiatal hernia   . History of upper gastrointestinal hemorrhage    dr. Maurene Capes, GI  . Hyperlipidemia   . Hypertension   . Internal hemorrhoids   . LV dysfunction    a. EF 45-50% by echo 05/2012.  [name]  Permanent atrial fibrillation    INR followed at [facility].  [name] Vision problems     Past Surgical History:  Procedure Laterality Date  . ASD REPAIR  1983  . CARDIOVERSION     possibly 3 times, dr. brody/cooper  . CHOLECYSTECTOMY  1990s  . ENTEROSCOPY  05/13/2012   Procedure: ENTEROSCOPY;  Surgeon: Inda Castle, MD;  Location: [facility];  Service: Endoscopy;  Laterality: N/A;  . [place]  . LEFT HEART CATHETERIZATION WITH CORONARY ANGIOGRAM N/A 09/06/2014   Procedure: LEFT HEART CATHETERIZATION WITH CORONARY ANGIOGRAM;  Surgeon: Jettie Booze, MD;  Location: [facility] C[facility] LAB;  Service: Cardiovascular;  Laterality: N/A;  .  partial small bowel resection  2005   ischemic?    Current Outpatient Medications  Medication Sig Dispense Refill  . buPROPion (WELLBUTRIN XL) 150 MG 24 hr tablet Take 1 tablet (150 mg total) by mouth daily. 90 tablet 3  . clotrimazole-betamethasone (LOTRISONE) cream APPLY TO AFFECTED AREAS TWICE A DAY (Patient taking differently: APPLY TO AFFECTED AREAS as needed) 45 g 0  . dutasteride (AVODART) 0.5 MG capsule TAKE (1) CAPSULE DAILY 90 capsule 3  . furosemide (LASIX) 20 MG tablet Take 1 tablet (20 mg total) by mouth as needed. For edema 30 tablet 6  . ipratropium (ATROVENT) 0.02 % nebulizer solution NEBULIZE 1 VAIL 4 TIMES A DAY AS DIRECTED (Patient taking differently: NEBULIZE 1 VAIL  A DAY AS needed) 312.5 mL 0  . isosorbide mononitrate (IMDUR) 30 MG 24 hr tablet Take 1 tablet (30 mg total) by mouth daily. 90 tablet 3  . levalbuterol (XOPENEX) 0.63 MG/3ML nebulizer solution NEBULIZE 1 VIAL (WITH IPRATROPIUM) TWICE A DAY AS NEEDED. MAY USE EXTR A EVERY 4 HOURS IF NEEDED 216 mL 0  . mirtazapine (REMERON) 15 MG tablet Take 1 tablet (15 mg total) by mouth daily. 90 tablet 3  . mupirocin ointment (BACTROBAN) 2 % Apply 1 application topically 2 (two) times daily.    . nitroGLYCERIN (NITROSTAT) 0.4 MG SL tablet Place 1 tablet (0.4 mg total) under the tongue every 5 (five) minutes x 3 doses as needed for chest pain. 25 tablet 3  . pantoprazole (PROTONIX) 40 MG tablet Take 1 tablet (40 mg total) by mouth daily. 90 tablet 3  . rivaroxaban (XARELTO) 20 MG TABS tablet TAKE 1 TABLET DAILY WITH SUPPER 90 tablet 3  . saccharomyces boulardii (FLORASTOR) 250 MG capsule Take 250 mg by mouth 2 (two) times daily.    . simvastatin (ZOCOR) 20 MG tablet TAKE ONE TABLET AT BEDTIME 90 tablet 1  . vitamin B-12 (CYANOCOBALAMIN) 500 MCG tablet Take 500 mcg by mouth daily.     No current facility-administered medications for this visit.     Allergies:   Lovenox [enoxaparin sodium], Tape, and Penicillins   Social  History:  The patient  reports that he quit smoking about 35 years ago. His smoking use included cigarettes. He smoked 0.00 packs per day for 0.00 years. He has never used smokeless tobacco. He reports that he does not drink alcohol or use drugs.   Family History:  The patient's family history includes Heart disease in his father and mother; Prostate cancer in his brother.  ROS:  Please see the history of present illness.  All other systems are reviewed and otherwise negative.   PHYSICAL EXAM:  VS:  BP (!) 150/98   Pulse 83   Ht 5\' 11"  (1.803 m)   Wt 130 lb (59 kg)  BMI 18.13 kg/m  BMI: Body mass index is 18.13 kg/m. Well nourished, well developed, in no acute distress  HEENT: normocephalic, atraumatic  Neck: no JVD, carotid bruits or masses Cardiac:  irreg-irreg, soft SM,, no rubs, or gallops Lungs:  CTA b/l, no wheezing, rhonchi or rales  Abd: soft, nontender MS: no deformity or aadvanced atrophy Ext: no edema  Skin: warm and dry, no rash Neuro:  No gross deficits appreciated Psych: euthymic mood, full affect   EKG:  Done today and reviewed by myself shows AFib, 83bpm, RBBB, appears similar to old, a second EKG done same time noted to have PVCs   01/12/16: TTE Study Conclusions - Left ventricle: The cavity size was normal. Wall thickness was   increased in a pattern of mild LVH. Systolic function was normal.   The estimated ejection fraction was in the range of 55% to 60%.   Wall motion was normal; there were no regional wall motion   abnormalities. Doppler parameters are consistent with high   ventricular filling pressure. - Mitral valve: Calcified annulus. There was mild regurgitation. - Left atrium: The atrium was severely dilated. - Right ventricle: The cavity size was moderately dilated. Systolic   function was moderately reduced. - Right atrium: The atrium was severely dilated. - Pulmonary arteries: Systolic pressure was moderately increased.   PA peak pressure:  52 mm Hg (S).  Impressions - Normal LV systolic function; mild LVH; elevated LV filling   pressure; severe biatrial enlargement; moderate RVE with reduced   function; mild MR; mild TR with moderately elevated pulmonary   pressure.    Recent Labs: 04/06/2018: ALT 14; BUN 17; Creatinine, Ser 0.85; Potassium 4.2; Sodium 141 06/29/2018: Hemoglobin 14.5; Platelets 194.0  04/06/2018: Cholesterol 136; HDL 60.50; LDL Cholesterol 53; Total CHOL/HDL Ratio 2; Triglycerides 112.0; VLDL 22.4   CrCl cannot be calculated (Patient's most recent lab result is older than the maximum 21 days allowed.).   Wt Readings from Last 3 Encounters:  01/25/19 130 lb (59 kg)  06/29/18 136 lb (61.7 kg)  04/06/18 132 lb 9.6 oz (60.1 kg)     Other studies reviewed: Additional studies/records reviewed today include: summarized above  ASSESSMENT AND PLAN:  1. Permanent AFib     CHA2DS2Vasc is 5, on Xarelto, appropriately dosed by last available  labs     Recommend updating his labs for his xarelto though the patient's wife declines, they are due to see his PMD and she will have them do his labs  2. HTN     High today, recheck is 148/96 L arm,  150/90 R side     His wife is surprised to hear his BP is high, "never is" and very reluctantly to make any changes  Recommend investing in a home BP cuff if able and monitoring his BP daily to notify us if regularly >140/80 His wife states she will and will follow up with he PMD for this as well.  3. Functional decline, falls     No further falls are reported       Disposition: F/u with [name] in 1 year and as needed, the patient defers to his wife for most everything, she prefers to have his BP, labs managed by his PMD.  We are available if needed sooner.   Current medicines are reviewed at length with the patient today.  The patient did not have any concerns regarding medicines.  Venetia Night, PA-C 01/25/2019 3:07 PM     [facility] [id number]  [employer] [place] [address] [phone] (office)  [phone] (fax)

## 2019-01-25 ENCOUNTER — Encounter: Payer: Self-pay | Admitting: Physician Assistant

## 2019-01-25 ENCOUNTER — Ambulatory Visit (INDEPENDENT_AMBULATORY_CARE_PROVIDER_SITE_OTHER): Payer: Medicare Other | Admitting: Physician Assistant

## 2019-01-25 ENCOUNTER — Other Ambulatory Visit: Payer: Self-pay

## 2019-01-25 VITALS — BP 150/98 | HR 83 | Ht 71.0 in | Wt 130.0 lb

## 2019-01-25 DIAGNOSIS — I4821 Permanent atrial fibrillation: Secondary | ICD-10-CM

## 2019-01-25 DIAGNOSIS — I10 Essential (primary) hypertension: Secondary | ICD-10-CM

## 2019-01-25 NOTE — Patient Instructions (Addendum)
Medication Instructions:  Your physician recommends that you continue on your current medications as directed. Please refer to the Current Medication list given to you today.  If you need a refill on your cardiac medications before your next appointment, please call your pharmacy.   Lab work: NONE ORDERED  TODAY\  If you have labs (blood work) drawn today and your tests are completely normal, you will receive your results only by: [name] MyChart Message (if you have MyChart) OR . A paper copy in the mail If you have any lab test that is abnormal or we need to change your treatment, we will call you to review the results.  Testing/Procedures: NONE ORDERED  TODAY   Follow-Up: At [facility], you and your health needs are our priority.  As part of our continuing mission to provide you with exceptional heart care, we have created designated Provider Care Teams.  These Care Teams include your primary Cardiologist (physician) and Advanced Practice Providers (APPs -  Physician Assistants and Nurse Practitioners) who all work together to provide you with the care you need, when you need it. You will need a follow up appointment in 1 years.  Please call our office 2 months in advance to schedule this appointment.  You may see Dr. Rayann Heman or one of the following Advanced Practice Providers on your designated Care Team:   Chanetta Marshall, NP . Tommye Standard, PA-C . Joesph July PA-C   Any Other Special Instructions Will Be Listed Below (If Applicable).  PLEASE KEEP LOG OF BLOOD PRESSURES CONTACT NURSE TRIAGE AT  OFFICE IF BLOOD PRESSURE CONSISTENTLY  OVER 140/90

## 2019-01-30 ENCOUNTER — Other Ambulatory Visit: Payer: Self-pay | Admitting: Family Medicine

## 2019-02-05 ENCOUNTER — Other Ambulatory Visit: Payer: Self-pay

## 2019-02-05 ENCOUNTER — Ambulatory Visit: Payer: Medicare Other | Admitting: Family Medicine

## 2019-02-05 ENCOUNTER — Encounter: Payer: Self-pay | Admitting: Family Medicine

## 2019-02-05 VITALS — BP 170/98 | HR 98 | Temp 97.6°F | Ht 71.0 in | Wt 127.7 lb

## 2019-02-05 DIAGNOSIS — I4891 Unspecified atrial fibrillation: Secondary | ICD-10-CM

## 2019-02-05 DIAGNOSIS — E785 Hyperlipidemia, unspecified: Secondary | ICD-10-CM

## 2019-02-05 DIAGNOSIS — R627 Adult failure to thrive: Secondary | ICD-10-CM

## 2019-02-05 DIAGNOSIS — N183 Chronic kidney disease, stage 3 unspecified: Secondary | ICD-10-CM

## 2019-02-05 DIAGNOSIS — I10 Essential (primary) hypertension: Secondary | ICD-10-CM

## 2019-02-05 LAB — BASIC METABOLIC PANEL
BUN: 21 mg/dL (ref 6–23)
CO2: 32 mEq/L (ref 19–32)
Calcium: 9.5 mg/dL (ref 8.4–10.5)
Chloride: 103 mEq/L (ref 96–112)
Creatinine, Ser: 1.03 mg/dL (ref 0.40–1.50)
GFR: 68.69 mL/min (ref >60.00)
Glucose, Bld: 113 mg/dL — ABNORMAL HIGH (ref 70–99)
Potassium: 3.5 mEq/L (ref 3.5–5.1)
Sodium: 143 mEq/L (ref 135–145)

## 2019-02-05 LAB — HEPATIC FUNCTION PANEL
ALT: 10 U/L (ref 0–53)
AST: 17 U/L (ref 0–37)
Albumin: 4.3 g/dL (ref 3.5–5.2)
Alkaline Phosphatase: 60 U/L (ref 39–117)
Bilirubin, Direct: 0.2 mg/dL (ref 0.0–0.3)
Total Bilirubin: 0.9 mg/dL (ref 0.2–1.2)
Total Protein: 6.8 g/dL (ref 6.0–8.3)

## 2019-02-05 LAB — CBC WITH DIFFERENTIAL/PLATELET
Basophils Absolute: 0.1 10*3/uL (ref 0.0–0.1)
Basophils Relative: 1.2 % (ref 0.0–3.0)
Eosinophils Absolute: 0.1 10*3/uL (ref 0.0–0.7)
Eosinophils Relative: 2.1 % (ref 0.0–5.0)
HCT: 43.7 % (ref 39.0–52.0)
Hemoglobin: 14.6 g/dL (ref 13.0–17.0)
Lymphocytes Relative: 22 % (ref 12.0–46.0)
Lymphs Abs: 1.1 10*3/uL (ref 0.7–4.0)
MCHC: 33.5 g/dL (ref 30.0–36.0)
MCV: 90.1 fl (ref 78.0–100.0)
Monocytes Absolute: 0.5 10*3/uL (ref 0.1–1.0)
Monocytes Relative: 9.6 % (ref 3.0–12.0)
Neutro Abs: 3.1 10*3/uL (ref 1.4–7.7)
Neutrophils Relative %: 65.1 % (ref 43.0–77.0)
Platelets: 150 10*3/uL (ref 150.0–400.0)
RBC: 4.85 Mil/uL (ref 4.22–5.81)
RDW: 14.1 % (ref 11.5–15.5)
WBC: 4.8 10*3/uL (ref 4.0–10.5)

## 2019-02-05 LAB — LIPID PANEL
Cholesterol: 131 mg/dL (ref 0–200)
HDL: 51.9 mg/dL (ref >39.00)
LDL Cholesterol: 51 mg/dL (ref 0–99)
NonHDL: 79.47
Total CHOL/HDL Ratio: 3
Triglycerides: 144 mg/dL (ref 0.0–149.0)
VLDL: 28.8 mg/dL (ref 0.0–40.0)

## 2019-02-05 MED ORDER — DILTIAZEM HCL ER COATED BEADS 180 MG PO CP24: 180.0000 mg | ORAL_CAPSULE | Freq: Every day | ORAL | 3 refills | Status: DC

## 2019-02-05 NOTE — Progress Notes (Signed)
Subjective:     Patient ID: [name], male   DOB: 02-Dec-1932, 85 y.o.   MRN: [id number]  HPI   Patient has multiple medical problems and is here today with his wife.  He does not talk much and looks to her for most of the answers.  He has a long history of protein calorie malnutrition and has waxed and waned with his weight and appetite in the past.  He has been treated for depression and currently is on Wellbutrin and mirtazapine at night.  Wife states he eats a good breakfast but tends to be very picky throughout the day.  She is struggling to get him to drink much in way of fluids.  He was at cardiology recently but they declined labs and prefer to get them here.  His blood pressure was elevated then but has not been up generally in the past.  He has very high readings here today  Denies any chest pains, headaches, dyspnea.  Multiple chronic problems including hypertension, history of CAD, atrial fibrillation, history of diastolic heart failure, remote history of pulmonary emboli, COPD, chronic kidney disease, cognitive decline  Past Medical History:  Diagnosis Date  . Anemia   . ASD (atrial septal defect)    a. s/p repair 1983.  [name] BPH (benign prostatic hyperplasia)   . Cerebrovascular accident ([place]) [place]  . Chronic bronchitis ([place])    a. h/o resp failure in setting of bronchitis vs PNA 05/2012.  [name] Chronic diarrhea   . COPD (chronic obstructive pulmonary disease) ([place])   . Coronary artery disease    a. Nonobstructive by cath 2009 and 09/06/14  . Depression   . Dysphagia, unspecified(787.20)   . Erosive gastritis    H/o GIB felt secondary to this  . Esophagitis   . Falls frequently   . Gastric polyp   . Gastric ulcer   . GERD (gastroesophageal reflux disease)   . Hearing impairment   . Hiatal hernia   . History of upper gastrointestinal hemorrhage    dr. Maurene Capes, GI  . Hyperlipidemia   . Hypertension   . Internal hemorrhoids   . LV dysfunction    a. EF 45-50% by echo  05/2012.  [name] Permanent atrial fibrillation    INR followed at [facility].  [name] Vision problems    Past Surgical History:  Procedure Laterality Date  . ASD REPAIR  1983  . CARDIOVERSION     possibly 3 times, dr. brody/cooper  . CHOLECYSTECTOMY  1990s  . ENTEROSCOPY  05/13/2012   Procedure: ENTEROSCOPY;  Surgeon: Inda Castle, MD;  Location: [facility];  Service: Endoscopy;  Laterality: N/A;  . [place]  . LEFT HEART CATHETERIZATION WITH CORONARY ANGIOGRAM N/A 09/06/2014   Procedure: LEFT HEART CATHETERIZATION WITH CORONARY ANGIOGRAM;  Surgeon: Jettie Booze, MD;  Location: [facility] CATH LAB;  Service: Cardiovascular;  Laterality: N/A;  . partial small bowel resection  2005   ischemic?    reports that he quit smoking about 35 years ago. His smoking use included cigarettes. He smoked 0.00 packs per day for 0.00 years. He has never used smokeless tobacco. He reports that he does not drink alcohol or use drugs. family history includes Heart disease in his father and mother; Prostate cancer in his brother. Allergies  Allergen Reactions  . Lovenox [Enoxaparin Sodium] Swelling  . Tape Other (See Comments)    SKIN WILL TEAR IF TAPED (MUST BE LIGHT & BRIEF OR USE COBAN WRAP, PLEASE!!)  .  Penicillins Hives    Has patient had a PCN reaction causing immediate rash, facial/tongue/throat swelling, SOB or lightheadedness with hypotension: Yes Has patient had a PCN reaction causing severe rash involving mucus membranes or skin necrosis: No Has patient had a PCN reaction that required hospitalization: Unknown Has patient had a PCN reaction occurring within the last 10 years: No If all of the above answers are "NO", then may proceed with Cephalosporin use.      Review of Systems  Constitutional: Positive for fatigue. Negative for chills and fever.  Eyes: Negative for visual disturbance.  Respiratory: Negative for cough, chest tightness and shortness of breath.   Cardiovascular:  Negative for chest pain, palpitations and leg swelling.  Gastrointestinal: Negative for abdominal pain.  Neurological: Positive for weakness. Negative for dizziness, syncope, light-headedness and headaches.       Objective:   Physical Exam Constitutional:      Comments: Patient is somewhat frail in appearance but in no distress  Cardiovascular:     Rate and Rhythm: Normal rate and regular rhythm.  Pulmonary:     Effort: Pulmonary effort is normal.     Breath sounds: Normal breath sounds.  Musculoskeletal:     Right lower leg: No edema.     Left lower leg: No edema.  Neurological:     Mental Status: He is alert.        Assessment:     #1 adult failure to thrive  #2 chronic atrial fibrillation-maintained on Xarelto  #3 elevated blood pressure.  He has not had recent issues with blood pressure but significantly elevated today and also recently at cardiology  #4 hyperlipidemia    Plan:     -Recheck labs with lipids, basic metabolic panel, hepatic panel, CBC -Start diltiazem CD 180 mg once daily -Bring back in 3 to 4 weeks to reassess blood pressure -We made suggestions for trying to increase his fluid and nutrition intake.  He has been hesitant to do things like Ensure in the past.  We suggested his wife consider making smoothies and they could add some protein supplement.  [name] MD [facility]

## 2019-02-05 NOTE — Patient Instructions (Signed)
Start the Diltiazem one daily for high blood pressure.   Let's plan on one month follow up.

## 2019-03-31 ENCOUNTER — Other Ambulatory Visit: Payer: Self-pay | Admitting: Family Medicine

## 2019-05-03 ENCOUNTER — Other Ambulatory Visit: Payer: Self-pay | Admitting: Family Medicine

## 2019-05-31 ENCOUNTER — Other Ambulatory Visit: Payer: Self-pay | Admitting: Family Medicine

## 2019-06-14 DIAGNOSIS — Z85828 Personal history of other malignant neoplasm of skin: Secondary | ICD-10-CM | POA: Diagnosis not present

## 2019-06-14 DIAGNOSIS — L218 Other seborrheic dermatitis: Secondary | ICD-10-CM | POA: Diagnosis not present

## 2019-06-14 DIAGNOSIS — Z08 Encounter for follow-up examination after completed treatment for malignant neoplasm: Secondary | ICD-10-CM | POA: Diagnosis not present

## 2019-06-14 DIAGNOSIS — F633 Trichotillomania: Secondary | ICD-10-CM | POA: Diagnosis not present

## 2019-06-25 ENCOUNTER — Other Ambulatory Visit: Payer: Self-pay | Admitting: Physician Assistant

## 2019-07-02 ENCOUNTER — Other Ambulatory Visit: Payer: Self-pay | Admitting: Family Medicine

## 2019-08-02 ENCOUNTER — Other Ambulatory Visit: Payer: Self-pay | Admitting: Family Medicine

## 2019-08-02 NOTE — Telephone Encounter (Signed)
Refill for 6 months. 

## 2019-08-06 ENCOUNTER — Ambulatory Visit: Payer: Medicare PPO | Attending: Internal Medicine

## 2019-08-06 DIAGNOSIS — Z23 Encounter for immunization: Secondary | ICD-10-CM

## 2019-08-06 NOTE — Progress Notes (Signed)
   Covid-19 Vaccination Clinic  Name:  [name]    MRN: [id number] DOB: [birth year]  08/06/2019  [name] was observed post Covid-19 immunization for 15 minutes without incident. He was provided with Vaccine Information Sheet and instruction to access the V-Safe system.   [name] was instructed to call 911 with any severe reactions post vaccine: [name] Difficulty breathing  . Swelling of face and throat  . A fast heartbeat  . A bad rash all over body  . Dizziness and weakness   Immunizations Administered    Name Date Dose VIS Date Route   Moderna COVID-19 Vaccine 08/06/2019 11:08 AM 0.5 mL 05/04/2019 Intramuscular   Manufacturer: Moderna   Lot: [id number]   [place]:[id number]

## 2019-08-31 ENCOUNTER — Other Ambulatory Visit: Payer: Self-pay | Admitting: Family Medicine

## 2019-09-07 ENCOUNTER — Other Ambulatory Visit: Payer: Self-pay | Admitting: Family Medicine

## 2019-09-08 ENCOUNTER — Ambulatory Visit: Payer: Medicare PPO

## 2019-09-08 ENCOUNTER — Ambulatory Visit: Payer: Medicare PPO | Attending: Internal Medicine

## 2019-09-08 DIAGNOSIS — Z23 Encounter for immunization: Secondary | ICD-10-CM

## 2019-09-08 NOTE — Progress Notes (Signed)
   Covid-19 Vaccination Clinic  Name:  [name]    MRN: [id number] DOB: 1932-08-28  09/08/2019  [name] was observed post Covid-19 immunization for 15 minutes without incident. He was provided with Vaccine Information Sheet and instruction to access the V-Safe system.   [name] was instructed to call 911 with any severe reactions post vaccine: [name] Difficulty breathing  . Swelling of face and throat  . A fast heartbeat  . A bad rash all over body  . Dizziness and weakness   Immunizations Administered    Name Date Dose VIS Date Route   Moderna COVID-19 Vaccine 09/08/2019  2:06 PM 0.5 mL 05/04/2019 Intramuscular   Manufacturer: Levan Hurst   LotUD:[id number]   [id number]

## 2019-09-24 ENCOUNTER — Other Ambulatory Visit: Payer: Self-pay | Admitting: Physician Assistant

## 2019-09-24 ENCOUNTER — Other Ambulatory Visit: Payer: Self-pay | Admitting: Family Medicine

## 2019-10-29 ENCOUNTER — Other Ambulatory Visit: Payer: Self-pay | Admitting: Family Medicine

## 2019-10-29 ENCOUNTER — Telehealth: Payer: Self-pay | Admitting: Physician Assistant

## 2019-10-29 NOTE — Telephone Encounter (Signed)
The pt's wife called with a list of complaints for the pt.  She states he has swelling of the feet and legs and also some BRBPR.  She says he has a history of rectal bleeding with hemorrhoids.  I made an appt with Amy for 6/10 for the hemorrhoids but I did advise her that she needs to call his PCP in regards to the edema of the extremity's. The pt has been advised of the information and verbalized understanding.

## 2019-11-08 ENCOUNTER — Other Ambulatory Visit: Payer: Self-pay | Admitting: Family Medicine

## 2019-11-11 ENCOUNTER — Other Ambulatory Visit (INDEPENDENT_AMBULATORY_CARE_PROVIDER_SITE_OTHER): Payer: Medicare PPO

## 2019-11-11 ENCOUNTER — Encounter: Payer: Self-pay | Admitting: Physician Assistant

## 2019-11-11 ENCOUNTER — Ambulatory Visit: Payer: Medicare PPO | Admitting: Physician Assistant

## 2019-11-11 VITALS — BP 138/70 | HR 79 | Ht 71.0 in | Wt 142.0 lb

## 2019-11-11 DIAGNOSIS — K5900 Constipation, unspecified: Secondary | ICD-10-CM | POA: Diagnosis not present

## 2019-11-11 DIAGNOSIS — R195 Other fecal abnormalities: Secondary | ICD-10-CM

## 2019-11-11 DIAGNOSIS — K625 Hemorrhage of anus and rectum: Secondary | ICD-10-CM | POA: Diagnosis not present

## 2019-11-11 DIAGNOSIS — R1030 Lower abdominal pain, unspecified: Secondary | ICD-10-CM

## 2019-11-11 LAB — CBC WITH DIFFERENTIAL/PLATELET
Basophils Absolute: 0 10*3/uL (ref 0.0–0.1)
Basophils Relative: 0.7 % (ref 0.0–3.0)
Eosinophils Absolute: 0.2 10*3/uL (ref 0.0–0.7)
Eosinophils Relative: 3.4 % (ref 0.0–5.0)
HCT: 35.5 % — ABNORMAL LOW (ref 39.0–52.0)
Hemoglobin: 11.2 g/dL — ABNORMAL LOW (ref 13.0–17.0)
Lymphocytes Relative: 14.5 % (ref 12.0–46.0)
Lymphs Abs: 0.8 10*3/uL (ref 0.7–4.0)
MCHC: 31.6 g/dL (ref 30.0–36.0)
MCV: 81.4 fl (ref 78.0–100.0)
Monocytes Absolute: 0.6 10*3/uL (ref 0.1–1.0)
Monocytes Relative: 11.1 % (ref 3.0–12.0)
Neutro Abs: 4 10*3/uL (ref 1.4–7.7)
Neutrophils Relative %: 70.3 % (ref 43.0–77.0)
Platelets: 184 10*3/uL (ref 150.0–400.0)
RBC: 4.35 Mil/uL (ref 4.22–5.81)
RDW: 17.3 % — ABNORMAL HIGH (ref 11.5–15.5)
WBC: 5.6 10*3/uL (ref 4.0–10.5)

## 2019-11-11 LAB — COMPREHENSIVE METABOLIC PANEL
ALT: 14 U/L (ref 0–53)
AST: 20 U/L (ref 0–37)
Albumin: 4.2 g/dL (ref 3.5–5.2)
Alkaline Phosphatase: 68 U/L (ref 39–117)
BUN: 20 mg/dL (ref 6–23)
CO2: 30 mEq/L (ref 19–32)
Calcium: 9.1 mg/dL (ref 8.4–10.5)
Chloride: 104 mEq/L (ref 96–112)
Creatinine, Ser: 0.97 mg/dL (ref 0.40–1.50)
GFR: 73.49 mL/min (ref >60.00)
Glucose, Bld: 107 mg/dL — ABNORMAL HIGH (ref 70–99)
Potassium: 3.8 mEq/L (ref 3.5–5.1)
Sodium: 140 mEq/L (ref 135–145)
Total Bilirubin: 0.5 mg/dL (ref 0.2–1.2)
Total Protein: 7.1 g/dL (ref 6.0–8.3)

## 2019-11-11 LAB — SEDIMENTATION RATE: Sed Rate: 20 mm/hr (ref 0–20)

## 2019-11-11 MED ORDER — PANTOPRAZOLE SODIUM 40 MG PO TBEC: 40.0000 mg | DELAYED_RELEASE_TABLET | Freq: Every day | ORAL | 11 refills | Status: AC

## 2019-11-11 NOTE — Patient Instructions (Signed)
If you are age 84 or older, your body mass index should be between 23-30. Your Body mass index is 19.8 kg/m. If this is out of the aforementioned range listed, please consider follow up with your Primary Care Provider.  If you are age 25 or younger, your body mass index should be between 19-25. Your Body mass index is 19.8 kg/m. If this is out of the aformentioned range listed, please consider follow up with your Primary Care Provider.   Your provider has requested that you go to the basement level for lab work before leaving today. Press "B" on the elevator. The lab is located at the first door on the left as you exit the elevator.  You have been scheduled for a CT scan of the abdomen and pelvis at [facility], 1st floor Radiology.  You are scheduled on 11/18/19 at 10:30. You should arrive 15 minutes prior to your appointment time for registration. Be sure not to have anything to eat or drink starting 4 hours prior to your imaging study.  Please go to [facility] Radiology at least 3 days prior to your procedure to pick up instructions and contrast for the exam.  WARNING: IF YOU ARE ALLERGIC TO IODINE/X-RAY DYE, PLEASE NOTIFY RADIOLOGY IMMEDIATELY AT [phone]! YOU WILL BE GIVEN A 13 HOUR PREMEDICATION PREP.   If you have any questions regarding your exam or if you need to reschedule, you may call [phone] between the hours of 8:00 am and 5:00 pm, Monday-Friday.  ________________________________________________________________________ Refills of your Pantoprazole have been sent to your pharmacy.  START on Miralax half to a whole capful in 8 ounces of water/juice daily.  Follow up pending the results of labs and imaging studies.

## 2019-11-11 NOTE — Progress Notes (Signed)
Subjective:    Patient ID: [name], male    DOB: 1933/03/02, 86 y.o.   MRN: [id number]  HPI [name] is a pleasant 86 year old white male, established with Dr. Silverio [name] , last seen in January 2020 who comes in today with complaints of constipation and rectal bleeding. Most of history is provided by the patient's wife.  He also has history of chronic GERD and has been on Protonix 40 mg p.o. daily.  They relate they ran out of medication a couple of months ago.  He has been having a lot of difficulty over the past couple of months with worsening constipation to the point that she has been needing to give him enemas occasionally to produce a bowel movement.  He has no complaints of abdominal pain.  Patient's wife says she has been having to assist him to the bathroom etc. and is noted that most of his bowel movements are either dark or contain a tablespoon or 2 of red blood mixed in with the bowel movement.  This has been occurring very regularly over the past couple of months. Appetite has been fair, they are not aware of any significant weight loss, he denies any heartburn or indigestion or dysphagia. He is on Xarelto chronically for history of atrial fibrillation and also had a prior pulmonary embolus. Patient's wife also relates that he has been on Florastor twice daily fairly long-term which had been given in the past for history of diarrhea.  She says if she does not keep him on Florastor the diarrhea will recur. Last labs were done in September 2020 with hemoglobin of 14.6. Last colonoscopy was done in 2007 with finding of diverticulosis.  He had EGD and enteroscopy in 2013 with finding of linear gastric erosions and a large hiatal hernia as well as esophageal stricture Colonoscopy had previously been discussed at visits in 2017, and 2018 when he was also noted to be iron deficient.  Patient did not want to pursue endoscopic evaluation Patient and wife are still reluctant to pursue endoscopic  evaluation but his wife relates that she is worried that something needs to be done because she is seeing blood on a more regular basis.   Review of Systems Pertinent positive and negative review of systems were noted in the above HPI section.  All other review of systems was otherwise negative.  Outpatient Encounter Medications as of 11/11/2019  Medication Sig  . buPROPion (WELLBUTRIN XL) 150 MG 24 hr tablet TAKE 1 TABLET EVERY DAY  . diltiazem (CARDIZEM CD) 180 MG 24 hr capsule Take 1 capsule (180 mg total) by mouth daily.  [name] dutasteride (AVODART) 0.5 MG capsule TAKE (1) CAPSULE DAILY  . ipratropium (ATROVENT) 0.02 % nebulizer solution NEBULIZE 1 VAIL 4 TIMES A DAY AS DIRECTED (Patient taking differently: NEBULIZE 1 VAIL  A DAY AS needed)  . isosorbide mononitrate (IMDUR) 30 MG 24 hr tablet TAKE 1 TABLET DAILY  . levalbuterol (XOPENEX) 0.63 MG/3ML nebulizer solution NEBULIZE 1 VIAL (WITH IPRATROPIUM) TWICE A DAY AS NEEDED. MAY USE EXTR A EVERY 4 HOURS IF NEEDED  . mirtazapine (REMERON) 15 MG tablet TAKE 1 TABLET DAILY  . nitroGLYCERIN (NITROSTAT) 0.4 MG SL tablet Place 1 tablet (0.4 mg total) under the tongue every 5 (five) minutes x 3 doses as needed for chest pain.  . pantoprazole (PROTONIX) 40 MG tablet Take 1 tablet (40 mg total) by mouth daily.  [name] saccharomyces boulardii (FLORASTOR) 250 MG capsule Take 250 mg by mouth 2 (two)  times daily.  . simvastatin (ZOCOR) 20 MG tablet TAKE ONE TABLET AT BEDTIME  . vitamin B-12 (CYANOCOBALAMIN) 500 MCG tablet Take 500 mcg by mouth daily.  Alveda Reasons 20 MG TABS tablet TAKE 1 TABLET DAILY WITH SUPPER  . [DISCONTINUED] pantoprazole (PROTONIX) 40 MG tablet TAKE 1 TABLET DAILY  . clotrimazole-betamethasone (LOTRISONE) cream APPLY TO AFFECTED AREAS TWICE A DAY (Patient not taking: Reported on 11/11/2019)  . furosemide (LASIX) 20 MG tablet Take 1 tablet (20 mg total) by mouth as needed. For edema (Patient not taking: Reported on 11/11/2019)  . mupirocin  ointment (BACTROBAN) 2 % Apply 1 application topically 2 (two) times daily. (Patient not taking: Reported on 11/11/2019)   No facility-administered encounter medications on file as of 11/11/2019.   Allergies  Allergen Reactions  . Lovenox [Enoxaparin Sodium] Swelling  . Tape Other (See Comments)    SKIN WILL TEAR IF TAPED (MUST BE LIGHT & BRIEF OR USE COBAN WRAP, PLEASE!!)  . Penicillins Hives    Has patient had a PCN reaction causing immediate rash, facial/tongue/throat swelling, SOB or lightheadedness with hypotension: Yes Has patient had a PCN reaction causing severe rash involving mucus membranes or skin necrosis: No Has patient had a PCN reaction that required hospitalization: Unknown Has patient had a PCN reaction occurring within the last 10 years: No If all of the above answers are "NO", then may proceed with Cephalosporin use.    Patient Active Problem List   Diagnosis Date Noted  . Frequent falls 12/31/2017  . Palliative care by specialist   . Advance care planning   . Goals of care, counseling/discussion   . Dementia ([place]) 09/30/2017  . Chest pain 09/29/2017  . Elevated PSA 02/02/2016  . Pulmonary emboli ([place]) 01/11/2016  . Actinic keratoses 11/16/2015  . Memory loss 11/16/2015  . Atypical chest pain 01/28/2015  . Right carotid bruit 09/26/2014  . Abnormal stress test   . Chronic atrial fibrillation ([place]) 09/04/2014  . On warfarin therapy 09/04/2014  . Encounter for therapeutic drug monitoring 07/26/2013  . Bradycardia 02/17/2013  . Acute on chronic systolic CHF (congestive heart failure) ([place]) 11/16/2012  . Acute on chronic renal failure ([place]) 11/16/2012  . Failure to thrive in adult 10/30/2012  . Anemia of chronic disease 10/13/2012  . Toxic encephalopathy secondary to UTI 10/12/2012  . Depression 10/12/2012  . Protein-calorie malnutrition ([place]) 10/12/2012  . Stage III chronic kidney disease 10/12/2012  . CHF (congestive heart failure) ([place]) 09/06/2012  .  Chronic diastolic (congestive) heart failure ([place]) 07/23/2012  . Hypokalemia 07/20/2012  . Hypomagnesemia 07/20/2012  . Hypocalcemia 07/20/2012  . Acute kidney injury ([place]) 07/20/2012  . Chronic diarrhea 07/20/2012  . Falls frequently 07/20/2012  . Leukocytosis 07/20/2012  . Normocytic anemia 07/20/2012  . Chronic respiratory failure ([place]) 05/23/2012  . COPD exacerbation ([place]) 05/22/2012  . Gastritis with hemorrhage 05/13/2012  . Stricture and stenosis of esophagus 05/13/2012  . Acute posthemorrhagic anemia 05/12/2012  . Atrial fibrillation with rapid ventricular response ([place]) 05/06/2012  . Dysphagia 05/06/2012  . HTN (hypertension) 05/06/2012  . H/O: CVA (cerebrovascular accident) 05/06/2012  . GERD (gastroesophageal reflux disease) 05/06/2012  . Purulent bronchitis ([place]) 05/06/2012  . SOB (shortness of breath) 03/19/2010  . ABDOMINAL DISTENSION 03/19/2010  . FATIGUE 11/09/2009  . B12 DEFICIENCY 07/26/2009  . Hyperglycemia 07/26/2009  . Hyperlipidemia 07/20/2009  . Hereditary and idiopathic peripheral neuropathy 07/20/2009  . BPH (benign prostatic hyperplasia) 07/20/2009  . Essential hypertension 09/05/2007  . Coronary atherosclerosis 09/05/2007  . FIBRILLATION,  ATRIAL 09/05/2007  . Cerebral artery occlusion with cerebral infarction ([place]) 09/05/2007  . ESOPHAGITIS 09/05/2007  . GERD 09/05/2007  . EROSIVE GASTRITIS 09/05/2007  . UPPER GASTROINTESTINAL HEMORRHAGE 09/05/2007  . DYSPHAGIA UNSPECIFIED 09/05/2007  . INTERNAL HEMORRHOIDS 06/10/2003  . GASTRIC POLYP 12/03/2001  . HIATAL HERNIA 12/03/2001  . COLONIC POLYPS 09/25/1992  . GASTRIC ULCER 09/22/1992   Social History   Socioeconomic History  . Marital status: Married    Spouse name: [name]  . Number of children: 1  . Years of education: Not on file  . Highest education level: Not on file  Occupational History  . Not on file  Tobacco Use  . Smoking status: Former Smoker    Packs/day: 0.00    Years: 0.00     Pack years: 0.00    Types: Cigarettes    Quit date: 06/13/1983    Years since quitting: 36.4  . Smokeless tobacco: Never Used  Vaping Use  . Vaping Use: Never used  Substance and Sexual Activity  . Alcohol use: No  . Drug use: No  . Sexual activity: Not on file  Other Topics Concern  . Not on file  Social History Narrative   Lives in [place] with wife.  Several falls recently, 4 in the last month.        [name]  [phone]               Social Determinants of Health   Financial Resource Strain:   . Difficulty of Paying Living Expenses:   Food Insecurity:   . Worried About Charity fundraiser in the Last Year:   . Arboriculturist in the Last Year:   Transportation Needs:   . Film/video editor (Medical):   [name] Lack of Transportation (Non-Medical):   Physical Activity:   . Days of Exercise per Week:   . Minutes of Exercise per Session:   Stress:   . Feeling of Stress :   Social Connections:   . Frequency of Communication with Friends and Family:   . Frequency of Social Gatherings with Friends and Family:   . Attends Religious Services:   . Active Member of Clubs or Organizations:   . Attends Archivist Meetings:   [name] Marital Status:   Intimate Partner Violence:   . Fear of Current or Ex-Partner:   . Emotionally Abused:   [name] Physically Abused:   . Sexually Abused:     Mr. Trickett family history includes Heart disease in his father and mother; Prostate cancer in his brother.      Objective:    Vitals:   11/11/19 1442  BP: 138/70  Pulse: 79    Physical Exam Well-developed well-nourished elderly frail-appearing white male, accompanied by his wife who offers most of history.  Patient ambulates with difficulty with a walker.  In no acute distress.  Height, Weight, 142 BMI 19.8  HEENT; nontraumatic normocephalic, EOMI, PER R LA, sclera anicteric. Oropharynx; not examined Neck; supple, no JVD Cardiovascular;ir regular rate and rhythm with  S1-S2, no murmur rub or gallop Pulmonary; Clear bilaterally, decreased breath sounds bilaterally Abdomen; soft, scaphoid , minimal tenderness bilateral lower quadrants nondistended, no palpable mass or hepatosplenomegaly, bowel sounds are active Rectal; no external lesions noted, no hemorrhoids, on digital exam scant stool present brown and heme positive no palpable internal hemorrhoids or mass Skin; benign exam, no jaundice rash or appreciable lesions Extremities; no clubbing cyanosis or edema skin warm and dry Neuro/Psych; alert and  oriented x4, grossly nonfocal mood and affect appropriate       Assessment & Plan:   #72 86 year old white male with 2 to 42-monthhistory of constipation, and frequent episodes of blood in stool with both dark stools and bright red blood in stool. No external or internal hemorrhoids obvious on exam today, no mass on rectal exam, stool is heme positive  Etiology of patient's bleeding is not clear, last colonoscopy was in 2007 with finding of diverticulosis.,  Rule out occult colon neoplasm, colitis, possible stercoral ulceration, AVMs  #2 prior history of iron deficiency anemia #3 history of large hiatal hernia #4 chronic anticoagulation-on Xarelto 5.  Prior history of CVA 6.  Prior history of pulmonary embolism 7.  Atrial fibrillation 8.  COPD 9.  Congestive heart failure with EF 55%  Plan; CBC with differential, c-Met, After discussion with the patient and the patient's wife regarding options for further evaluation will proceed initially with CT scan of the abdomen and pelvis with contrast. Hopefully colonoscopy can be avoided, if needed I am not sure that he will be able to prep at home and would likely need to be scheduled at the hospital due to his mobility issues. Start MiraLAX 17 g in 8 ounces of water every day. Refill Protonix 40 mg p.o. every morning Further recommendations pending labs and CT. Kandie Keiper S Milley Vining PA-C 11/11/2019   Cc: BEulas Post MD

## 2019-11-12 NOTE — Progress Notes (Signed)
Reviewed and agree with documentation and assessment and plan. K. Veena Tarita Deshmukh , MD   

## 2019-11-17 ENCOUNTER — Encounter: Payer: Self-pay | Admitting: Family Medicine

## 2019-11-17 ENCOUNTER — Other Ambulatory Visit: Payer: Self-pay

## 2019-11-17 ENCOUNTER — Ambulatory Visit (INDEPENDENT_AMBULATORY_CARE_PROVIDER_SITE_OTHER): Payer: Medicare PPO | Admitting: Family Medicine

## 2019-11-17 VITALS — BP 134/76 | HR 76 | Temp 98.1°F | Wt 138.9 lb

## 2019-11-17 DIAGNOSIS — I482 Chronic atrial fibrillation, unspecified: Secondary | ICD-10-CM | POA: Diagnosis not present

## 2019-11-17 DIAGNOSIS — R600 Localized edema: Secondary | ICD-10-CM | POA: Diagnosis not present

## 2019-11-17 DIAGNOSIS — D649 Anemia, unspecified: Secondary | ICD-10-CM | POA: Diagnosis not present

## 2019-11-17 MED ORDER — FUROSEMIDE 20 MG PO TABS: 20.0000 mg | ORAL_TABLET | ORAL | 6 refills | Status: AC | PRN

## 2019-11-17 MED ORDER — POTASSIUM CHLORIDE ER 10 MEQ PO TBCR
EXTENDED_RELEASE_TABLET | ORAL | 3 refills | Status: AC
Start: 2019-11-17 — End: ?

## 2019-11-17 NOTE — Patient Instructions (Signed)
Take the Furosemide one daily as needed for leg edema  Elevate legs as much as possible  Consider compression stockings for the edema  Get body weight daily and be in touch for weight gain > 3 pounds in one day or 5 pounds in one week.

## 2019-11-17 NOTE — Progress Notes (Signed)
Established Patient Office Visit  Subjective:  Patient ID: [name], male    DOB: Aug 27, 1932  Age: 86 y.o. MRN: [id number]  CC:  Chief Complaint  Patient presents with  . Edema    in legs and feet for about a month     HPI [name] presents for chief complaint of swelling in legs for about the past month.  Wife states that this is present morning and night.  He responds to questions by nodding but is minimally communicative.   His weight is up to 139 pounds today from 127 pounds last September.  She thinks he is eating fairly well.  Denies any dyspnea.  He has had overall general decline for several months but had similar declines previously.  He saw GI recently with frequent episodes of blood in stool dark and bright.  Stool heme positive.  He had labs which included hemoglobin 11.2.  He had chemistries which were stable.  Albumin 4.2.  Sed rate 20.  He has been scheduled for CT abdomen pelvis tomorrow.  His chronic problems include past history of iron deficiency anemia (hx of UGI bleed), history of atrial fibrillation and pulmonary emboli on chronic anticoagulation with Xarelto.  History of cerebrovascular disease.  History of COPD.  No reported orthopnea.  He has Lasix on list but they have not taken this in quite some time.  Wife states his prescription is very old.  They have no potassium left.  Past Medical History:  Diagnosis Date  . Anemia   . ASD (atrial septal defect)    a. s/p repair 1983.  [name] BPH (benign prostatic hyperplasia)   . Cerebrovascular accident ([place]) [place]  . Chronic bronchitis ([place])    a. h/o resp failure in setting of bronchitis vs PNA 05/2012.  [name] Chronic diarrhea   . COPD (chronic obstructive pulmonary disease) ([place])   . Coronary artery disease    a. Nonobstructive by cath 2009 and 09/06/14  . Depression   . Dysphagia, unspecified(787.20)   . Erosive gastritis    H/o GIB felt secondary to this  . Esophagitis   . Falls frequently   . Gastric  polyp   . Gastric ulcer   . GERD (gastroesophageal reflux disease)   . Hearing impairment   . Hiatal hernia   . History of upper gastrointestinal hemorrhage    dr. Maurene Capes, GI  . Hyperlipidemia   . Hypertension   . Internal hemorrhoids   . LV dysfunction    a. EF 45-50% by echo 05/2012.  [name] Permanent atrial fibrillation (HCC)    [facility]  [name] Vision problems     Past Surgical History:  Procedure Laterality Date  . ASD REPAIR  1983  . CARDIOVERSION     possibly 3 times, dr. brody/cooper  . CHOLECYSTECTOMY  1990s  . ENTEROSCOPY  05/13/2012   Procedure: ENTEROSCOPY;  Surgeon: Inda Castle, MD;  Location: [facility] ENDOSCOPY;  Service: Endoscopy;  Laterality: N/A;  . [place]  . LEFT HEART CATHETERIZATION WITH CORONARY ANGIOGRAM N/A 09/06/2014   Procedure: LEFT HEART CATHETERIZATION WITH CORONARY ANGIOGRAM;  Surgeon: Jettie Booze, MD;  Location: [facility] CATH LAB;  Service: Cardiovascular;  Laterality: N/A;  . partial small bowel resection  2005   ischemic?    Family History  Problem Relation Age of Onset  . Heart disease Mother   . Heart disease Father   . Prostate cancer Brother     Social History  Socioeconomic History  . Marital status: Married    Spouse name: [name]  . Number of children: 1  . Years of education: Not on file  . Highest education level: Not on file  Occupational History  . Not on file  Tobacco Use  . Smoking status: Former Smoker    Packs/day: 0.00    Years: 0.00    Pack years: 0.00    Types: Cigarettes    Quit date: 06/13/1983    Years since quitting: 36.4  . Smokeless tobacco: Never Used  Vaping Use  . Vaping Use: Never used  Substance and Sexual Activity  . Alcohol use: No  . Drug use: No  . Sexual activity: Not on file  Other Topics Concern  . Not on file  Social History Narrative   Lives in [place] with wife.  Several falls recently, 4 in the last month.        [name]  [phone]                Social Determinants of Health   Financial Resource Strain:   . Difficulty of Paying Living Expenses:   Food Insecurity:   . Worried About Charity fundraiser in the Last Year:   . Arboriculturist in the Last Year:   Transportation Needs:   . Film/video editor (Medical):   [name] Lack of Transportation (Non-Medical):   Physical Activity:   . Days of Exercise per Week:   . Minutes of Exercise per Session:   Stress:   . Feeling of Stress :   Social Connections:   . Frequency of Communication with Friends and Family:   . Frequency of Social Gatherings with Friends and Family:   . Attends Religious Services:   . Active Member of Clubs or Organizations:   . Attends Archivist Meetings:   [name] Marital Status:   Intimate Partner Violence:   . Fear of Current or Ex-Partner:   . Emotionally Abused:   [name] Physically Abused:   . Sexually Abused:     Outpatient Medications Prior to Visit  Medication Sig Dispense Refill  . buPROPion (WELLBUTRIN XL) 150 MG 24 hr tablet TAKE 1 TABLET EVERY DAY 30 tablet 6  . diltiazem (CARDIZEM CD) 180 MG 24 hr capsule Take 1 capsule (180 mg total) by mouth daily. 90 capsule 3  . dutasteride (AVODART) 0.5 MG capsule TAKE (1) CAPSULE DAILY 90 capsule 0  . ipratropium (ATROVENT) 0.02 % nebulizer solution NEBULIZE 1 VAIL 4 TIMES A DAY AS DIRECTED (Patient taking differently: NEBULIZE 1 VAIL  A DAY AS needed) 312.5 mL 0  . isosorbide mononitrate (IMDUR) 30 MG 24 hr tablet TAKE 1 TABLET DAILY 90 tablet 0  . levalbuterol (XOPENEX) 0.63 MG/3ML nebulizer solution NEBULIZE 1 VIAL (WITH IPRATROPIUM) TWICE A DAY AS NEEDED. MAY USE EXTR A EVERY 4 HOURS IF NEEDED 225 mL 0  . mirtazapine (REMERON) 15 MG tablet TAKE 1 TABLET DAILY 90 tablet 0  . nitroGLYCERIN (NITROSTAT) 0.4 MG SL tablet Place 1 tablet (0.4 mg total) under the tongue every 5 (five) minutes x 3 doses as needed for chest pain. 25 tablet 3  . pantoprazole (PROTONIX) 40 MG tablet Take 1 tablet (40 mg  total) by mouth daily. 30 tablet 11  . saccharomyces boulardii (FLORASTOR) 250 MG capsule Take 250 mg by mouth 2 (two) times daily.    . simvastatin (ZOCOR) 20 MG tablet TAKE ONE TABLET AT BEDTIME 90 tablet 0  .  vitamin B-12 (CYANOCOBALAMIN) 500 MCG tablet Take 500 mcg by mouth daily.    Alveda Reasons 20 MG TABS tablet TAKE 1 TABLET DAILY WITH SUPPER 90 tablet 0  . clotrimazole-betamethasone (LOTRISONE) cream APPLY TO AFFECTED AREAS TWICE A DAY (Patient not taking: Reported on 11/11/2019) 45 g 0  . mupirocin ointment (BACTROBAN) 2 % Apply 1 application topically 2 (two) times daily. (Patient not taking: Reported on 11/11/2019)    . furosemide (LASIX) 20 MG tablet Take 1 tablet (20 mg total) by mouth as needed. For edema (Patient not taking: Reported on 11/11/2019) 30 tablet 6   No facility-administered medications prior to visit.    Allergies  Allergen Reactions  . Lovenox [Enoxaparin Sodium] Swelling  . Tape Other (See Comments)    SKIN WILL TEAR IF TAPED (MUST BE LIGHT & BRIEF OR USE COBAN WRAP, PLEASE!!)  . Penicillins Hives    Has patient had a PCN reaction causing immediate rash, facial/tongue/throat swelling, SOB or lightheadedness with hypotension: Yes Has patient had a PCN reaction causing severe rash involving mucus membranes or skin necrosis: No Has patient had a PCN reaction that required hospitalization: Unknown Has patient had a PCN reaction occurring within the last 10 years: No If all of the above answers are "NO", then may proceed with Cephalosporin use.     ROS Review of Systems  Constitutional: Positive for fatigue. Negative for fever.  Respiratory: Negative for cough.   Cardiovascular: Negative for chest pain.  Gastrointestinal: Negative for abdominal pain, nausea and vomiting.  Genitourinary: Negative for dysuria.  Neurological: Positive for weakness.   Limited ROS obtainable from patient. Largely gathered from wife.   Objective:    Physical Exam Vitals reviewed.   Constitutional:      Comments: Thin, somewhat frail appearing 86 year old male.  Cardiovascular:     Rate and Rhythm: Normal rate.  Pulmonary:     Effort: Pulmonary effort is normal.     Breath sounds: Normal breath sounds.  Musculoskeletal:     Right lower leg: Edema present.     Left lower leg: Edema present.     Comments: 1+ pitting edema lower legs bilaterally.      BP 134/76 (BP Location: Left Arm, Patient Position: Sitting, Cuff Size: Normal)   Pulse 76   Temp 98.1 F (36.7 C) (Temporal)   Wt 138 lb 14.4 oz (63 kg)   SpO2 95%   BMI 19.37 kg/m  Wt Readings from Last 3 Encounters:  11/17/19 138 lb 14.4 oz (63 kg)  11/11/19 142 lb (64.4 kg)  02/05/19 127 lb 11.2 oz (57.9 kg)     Health Maintenance Due  Topic Date Due  . TETANUS/TDAP  Never done    There are no preventive care reminders to display for this patient.  Lab Results  Component Value Date   TSH 1.206 09/30/2017   Lab Results  Component Value Date   WBC 5.6 11/11/2019   HGB 11.2 (L) 11/11/2019   HCT 35.5 (L) 11/11/2019   MCV 81.4 11/11/2019   PLT 184.0 11/11/2019   Lab Results  Component Value Date   NA 140 11/11/2019   K 3.8 11/11/2019   CO2 30 11/11/2019   GLUCOSE 107 (H) 11/11/2019   BUN 20 11/11/2019   CREATININE 0.97 11/11/2019   BILITOT 0.5 11/11/2019   ALKPHOS 68 11/11/2019   AST 20 11/11/2019   ALT 14 11/11/2019   PROT 7.1 11/11/2019   ALBUMIN 4.2 11/11/2019   CALCIUM 9.1 11/11/2019  ANIONGAP 6 01/02/2018   GFR 73.49 11/11/2019   Lab Results  Component Value Date   CHOL 131 02/05/2019   Lab Results  Component Value Date   HDL 51.90 02/05/2019   Lab Results  Component Value Date   LDLCALC 51 02/05/2019   Lab Results  Component Value Date   TRIG 144.0 02/05/2019   Lab Results  Component Value Date   CHOLHDL 3 02/05/2019   Lab Results  Component Value Date   HGBA1C  06/26/2007    6.0 (NOTE)   The ADA recommends the following therapeutic goals for glycemic    control related to Hgb A1C measurement:   Goal of Therapy:   < 7.0% Hgb A1C   Action Suggested:  > 8.0% Hgb A1C   Ref:  Diabetes Care, 22, Suppl. 1, 1999      Assessment & Plan:   #1 progressive bilateral leg edema.  Weight up about 11 pounds from last September.  No evidence for overt pulmonary edema.  Suspect edema related to venous stasis and probably some diastolic dysfunction.  Recent albumin 4.2.  -Elevate legs frequently -Recommend daily weights and be in touch for weight gain greater than 3 pounds in 1 day or 5 pounds in 1 week -Start back furosemide 20 mg daily and we will plan to use just 3 to 4 days and then if improved back off.  Also sent prescription for K-Lor 10 mEq 2 tablets once daily on days that he takes furosemide -Discussed possible use of compression hose  #2 history of intermittent atrial fibrillation on Xarelto.  Rate control with diltiazem  #3 history of recent reported bloody stools being worked up by GI.  He is not a good candidate for colonoscopy  Meds ordered this encounter  Medications  . furosemide (LASIX) 20 MG tablet    Sig: Take 1 tablet (20 mg total) by mouth as needed. For edema    Dispense:  30 tablet    Refill:  6  . potassium chloride (KLOR-CON 10) 10 MEQ tablet    Sig: Take two tablets once daily on days taking Furosemide.    Dispense:  60 tablet    Refill:  3    Follow-up: No follow-ups on file.    Carolann Littler, MD

## 2019-11-18 ENCOUNTER — Ambulatory Visit (HOSPITAL_COMMUNITY)
Admission: RE | Admit: 2019-11-18 | Discharge: 2019-11-18 | Disposition: A | Discharge status: Home / Self Care | Payer: Medicare PPO | Source: Ambulatory Visit | Attending: Physician Assistant | Admitting: Physician Assistant

## 2019-11-18 ENCOUNTER — Other Ambulatory Visit: Payer: Self-pay

## 2019-11-18 DIAGNOSIS — R195 Other fecal abnormalities: Secondary | ICD-10-CM

## 2019-11-18 DIAGNOSIS — R1030 Lower abdominal pain, unspecified: Secondary | ICD-10-CM | POA: Diagnosis not present

## 2019-11-18 DIAGNOSIS — D509 Iron deficiency anemia, unspecified: Secondary | ICD-10-CM

## 2019-11-18 DIAGNOSIS — K5900 Constipation, unspecified: Secondary | ICD-10-CM

## 2019-11-18 DIAGNOSIS — K625 Hemorrhage of anus and rectum: Secondary | ICD-10-CM

## 2019-11-18 MED ORDER — IOHEXOL 300 MG/ML  SOLN
100.0000 mL | Freq: Once | INTRAMUSCULAR | Status: AC | PRN
  Administered 2019-11-18: 100 mL via INTRAVENOUS

## 2019-11-18 MED ORDER — SODIUM CHLORIDE (PF) 0.9 % IJ SOLN
INTRAMUSCULAR | Status: AC
  Filled 2019-11-18: qty 50

## 2019-11-23 DIAGNOSIS — F0150 Vascular dementia without behavioral disturbance: Secondary | ICD-10-CM | POA: Diagnosis not present

## 2019-11-23 DIAGNOSIS — Z87891 Personal history of nicotine dependence: Secondary | ICD-10-CM | POA: Diagnosis not present

## 2019-11-23 DIAGNOSIS — Z9181 History of falling: Secondary | ICD-10-CM | POA: Diagnosis not present

## 2019-11-23 DIAGNOSIS — G629 Polyneuropathy, unspecified: Secondary | ICD-10-CM | POA: Diagnosis not present

## 2019-11-23 DIAGNOSIS — E785 Hyperlipidemia, unspecified: Secondary | ICD-10-CM | POA: Diagnosis not present

## 2019-11-23 DIAGNOSIS — N529 Male erectile dysfunction, unspecified: Secondary | ICD-10-CM | POA: Diagnosis not present

## 2019-11-23 DIAGNOSIS — K5900 Constipation, unspecified: Secondary | ICD-10-CM | POA: Diagnosis not present

## 2019-11-23 DIAGNOSIS — I509 Heart failure, unspecified: Secondary | ICD-10-CM | POA: Diagnosis not present

## 2019-11-23 DIAGNOSIS — N400 Enlarged prostate without lower urinary tract symptoms: Secondary | ICD-10-CM | POA: Diagnosis not present

## 2019-11-23 DIAGNOSIS — D6869 Other thrombophilia: Secondary | ICD-10-CM | POA: Diagnosis not present

## 2019-11-23 DIAGNOSIS — K219 Gastro-esophageal reflux disease without esophagitis: Secondary | ICD-10-CM | POA: Diagnosis not present

## 2019-11-23 DIAGNOSIS — I110 Hypertensive heart disease with heart failure: Secondary | ICD-10-CM | POA: Diagnosis not present

## 2019-11-23 DIAGNOSIS — F419 Anxiety disorder, unspecified: Secondary | ICD-10-CM | POA: Diagnosis not present

## 2019-11-23 DIAGNOSIS — Z7901 Long term (current) use of anticoagulants: Secondary | ICD-10-CM | POA: Diagnosis not present

## 2019-11-23 DIAGNOSIS — I25119 Atherosclerotic heart disease of native coronary artery with unspecified angina pectoris: Secondary | ICD-10-CM | POA: Diagnosis not present

## 2019-11-23 DIAGNOSIS — I4891 Unspecified atrial fibrillation: Secondary | ICD-10-CM | POA: Diagnosis not present

## 2019-12-08 ENCOUNTER — Other Ambulatory Visit: Payer: Self-pay | Admitting: Family Medicine

## 2019-12-09 ENCOUNTER — Other Ambulatory Visit (INDEPENDENT_AMBULATORY_CARE_PROVIDER_SITE_OTHER): Payer: Medicare PPO

## 2019-12-09 DIAGNOSIS — D509 Iron deficiency anemia, unspecified: Secondary | ICD-10-CM | POA: Diagnosis not present

## 2019-12-09 LAB — CBC WITH DIFFERENTIAL/PLATELET
Basophils Absolute: 0 10*3/uL (ref 0.0–0.1)
Basophils Relative: 0.8 % (ref 0.0–3.0)
Eosinophils Absolute: 0.2 10*3/uL (ref 0.0–0.7)
Eosinophils Relative: 3.6 % (ref 0.0–5.0)
HCT: 35.8 % — ABNORMAL LOW (ref 39.0–52.0)
Hemoglobin: 11.4 g/dL — ABNORMAL LOW (ref 13.0–17.0)
Lymphocytes Relative: 22.1 % (ref 12.0–46.0)
Lymphs Abs: 1.2 10*3/uL (ref 0.7–4.0)
MCHC: 31.8 g/dL (ref 30.0–36.0)
MCV: 79 fl (ref 78.0–100.0)
Monocytes Absolute: 0.7 10*3/uL (ref 0.1–1.0)
Monocytes Relative: 13 % — ABNORMAL HIGH (ref 3.0–12.0)
Neutro Abs: 3.2 10*3/uL (ref 1.4–7.7)
Neutrophils Relative %: 60.5 % (ref 43.0–77.0)
Platelets: 191 10*3/uL (ref 150.0–400.0)
RBC: 4.53 Mil/uL (ref 4.22–5.81)
RDW: 17.3 % — ABNORMAL HIGH (ref 11.5–15.5)
WBC: 5.3 10*3/uL (ref 4.0–10.5)

## 2019-12-24 ENCOUNTER — Other Ambulatory Visit: Payer: Self-pay

## 2019-12-24 DIAGNOSIS — D509 Iron deficiency anemia, unspecified: Secondary | ICD-10-CM

## 2019-12-24 DIAGNOSIS — K2971 Gastritis, unspecified, with bleeding: Secondary | ICD-10-CM

## 2019-12-27 ENCOUNTER — Other Ambulatory Visit: Payer: Self-pay | Admitting: Family Medicine

## 2020-01-25 DIAGNOSIS — S41111A Laceration without foreign body of right upper arm, initial encounter: Secondary | ICD-10-CM | POA: Diagnosis not present

## 2020-01-26 ENCOUNTER — Other Ambulatory Visit: Payer: Self-pay | Admitting: Family Medicine

## 2020-02-08 ENCOUNTER — Other Ambulatory Visit: Payer: Self-pay | Admitting: Family Medicine

## 2020-02-28 ENCOUNTER — Other Ambulatory Visit: Payer: Self-pay | Admitting: Family Medicine

## 2020-02-28 NOTE — Telephone Encounter (Signed)
Refill for 6 months. 

## 2020-02-28 NOTE — Telephone Encounter (Signed)
Forwarding to PCP for approval  

## 2020-03-08 ENCOUNTER — Other Ambulatory Visit: Payer: Self-pay | Admitting: Family Medicine

## 2020-03-28 ENCOUNTER — Other Ambulatory Visit: Payer: Self-pay | Admitting: Family Medicine

## 2020-05-01 ENCOUNTER — Other Ambulatory Visit: Payer: Self-pay | Admitting: Family Medicine

## 2020-05-01 MED ORDER — SIMVASTATIN 20 MG PO TABS: 20.0000 mg | ORAL_TABLET | Freq: Every day | ORAL | 0 refills | Status: DC

## 2020-05-01 MED ORDER — DILTIAZEM HCL ER COATED BEADS 180 MG PO CP24: 180.0000 mg | ORAL_CAPSULE | Freq: Every day | ORAL | 0 refills | Status: DC

## 2020-05-01 NOTE — Telephone Encounter (Signed)
Rx's sent in. °

## 2020-05-01 NOTE — Telephone Encounter (Signed)
Patient spouse is calling and requesting a refill for diltiazem (CARDIZEM CD) 180 MG 24 hr capsule and simvastatin (ZOCOR) 20 MG tablet sent to  [facility], [address] Phone:  [phone] Fax:  [phone]  CB is [phone]

## 2020-05-02 ENCOUNTER — Other Ambulatory Visit: Payer: Self-pay | Admitting: Family Medicine

## 2020-05-09 ENCOUNTER — Other Ambulatory Visit: Payer: Self-pay | Admitting: Family Medicine

## 2020-06-02 ENCOUNTER — Other Ambulatory Visit: Payer: Self-pay | Admitting: Family Medicine

## 2020-06-26 ENCOUNTER — Other Ambulatory Visit: Payer: Self-pay | Admitting: Family Medicine

## 2020-07-10 ENCOUNTER — Other Ambulatory Visit: Payer: Self-pay | Admitting: Family Medicine

## 2020-07-12 ENCOUNTER — Telehealth: Payer: Self-pay | Admitting: Family Medicine

## 2020-07-12 NOTE — Telephone Encounter (Signed)
The pharmacy called in reference to ipratropium (ATROVENT) 0.02 % nebulizer solution  This Rx was last filled in 2019 and the patient hasn't been seen since 11/2019.  I advised the pharmacy and they are going to let the patient know that he needs to make an appointment.

## 2020-07-31 ENCOUNTER — Other Ambulatory Visit: Payer: Self-pay | Admitting: Family Medicine

## 2020-07-31 ENCOUNTER — Telehealth: Payer: Self-pay | Admitting: Family Medicine

## 2020-07-31 NOTE — Telephone Encounter (Signed)
Refill sent.

## 2020-07-31 NOTE — Telephone Encounter (Signed)
Pt spouse is calling in stating that the pt is out of Rx simvastatin (ZOCOR) 20 MG and diltiazem (CARDIZEM CD) 180 MG  Pharm:  [facility] in [place], [place]

## 2020-08-01 ENCOUNTER — Other Ambulatory Visit: Payer: Self-pay

## 2020-08-02 ENCOUNTER — Encounter: Payer: Self-pay | Admitting: Family Medicine

## 2020-08-02 ENCOUNTER — Ambulatory Visit (INDEPENDENT_AMBULATORY_CARE_PROVIDER_SITE_OTHER): Payer: Medicare PPO | Admitting: Family Medicine

## 2020-08-02 VITALS — BP 158/84 | HR 91 | Ht 71.0 in | Wt 133.0 lb

## 2020-08-02 DIAGNOSIS — F0390 Unspecified dementia without behavioral disturbance: Secondary | ICD-10-CM

## 2020-08-02 DIAGNOSIS — R634 Abnormal weight loss: Secondary | ICD-10-CM | POA: Diagnosis not present

## 2020-08-02 DIAGNOSIS — I10 Essential (primary) hypertension: Secondary | ICD-10-CM | POA: Diagnosis not present

## 2020-08-02 DIAGNOSIS — E538 Deficiency of other specified B group vitamins: Secondary | ICD-10-CM

## 2020-08-02 DIAGNOSIS — J9611 Chronic respiratory failure with hypoxia: Secondary | ICD-10-CM | POA: Diagnosis not present

## 2020-08-02 DIAGNOSIS — I5032 Chronic diastolic (congestive) heart failure: Secondary | ICD-10-CM | POA: Diagnosis not present

## 2020-08-02 DIAGNOSIS — E785 Hyperlipidemia, unspecified: Secondary | ICD-10-CM | POA: Diagnosis not present

## 2020-08-02 DIAGNOSIS — R627 Adult failure to thrive: Secondary | ICD-10-CM | POA: Diagnosis not present

## 2020-08-02 LAB — COMPREHENSIVE METABOLIC PANEL
ALT: 8 U/L (ref 0–53)
AST: 17 U/L (ref 0–37)
Albumin: 3.9 g/dL (ref 3.5–5.2)
Alkaline Phosphatase: 60 U/L (ref 39–117)
BUN: 20 mg/dL (ref 6–23)
CO2: 32 mEq/L (ref 19–32)
Calcium: 9.1 mg/dL (ref 8.4–10.5)
Chloride: 105 mEq/L (ref 96–112)
Creatinine, Ser: 0.89 mg/dL (ref 0.40–1.50)
GFR: 77.8 mL/min (ref >60.00)
Glucose, Bld: 98 mg/dL (ref 70–99)
Potassium: 3.8 mEq/L (ref 3.5–5.1)
Sodium: 141 mEq/L (ref 135–145)
Total Bilirubin: 0.6 mg/dL (ref 0.2–1.2)
Total Protein: 6.7 g/dL (ref 6.0–8.3)

## 2020-08-02 LAB — HEPATIC FUNCTION PANEL
ALT: 8 U/L (ref 0–53)
AST: 17 U/L (ref 0–37)
Albumin: 3.9 g/dL (ref 3.5–5.2)
Alkaline Phosphatase: 60 U/L (ref 39–117)
Bilirubin, Direct: 0.1 mg/dL (ref 0.0–0.3)
Total Bilirubin: 0.6 mg/dL (ref 0.2–1.2)
Total Protein: 6.7 g/dL (ref 6.0–8.3)

## 2020-08-02 LAB — LIPID PANEL
Cholesterol: 124 mg/dL (ref 0–200)
HDL: 56 mg/dL (ref >39.00)
LDL Cholesterol: 52 mg/dL (ref 0–99)
NonHDL: 68.3
Total CHOL/HDL Ratio: 2
Triglycerides: 83 mg/dL (ref 0.0–149.0)
VLDL: 16.6 mg/dL (ref 0.0–40.0)

## 2020-08-02 LAB — CBC WITH DIFFERENTIAL/PLATELET
Basophils Absolute: 0 10*3/uL (ref 0.0–0.1)
Basophils Relative: 0.9 % (ref 0.0–3.0)
Eosinophils Absolute: 0.2 10*3/uL (ref 0.0–0.7)
Eosinophils Relative: 4.1 % (ref 0.0–5.0)
HCT: 37.7 % — ABNORMAL LOW (ref 39.0–52.0)
Hemoglobin: 12.2 g/dL — ABNORMAL LOW (ref 13.0–17.0)
Lymphocytes Relative: 25.8 % (ref 12.0–46.0)
Lymphs Abs: 1.1 10*3/uL (ref 0.7–4.0)
MCHC: 32.4 g/dL (ref 30.0–36.0)
MCV: 80.1 fl (ref 78.0–100.0)
Monocytes Absolute: 0.4 10*3/uL (ref 0.1–1.0)
Monocytes Relative: 10.3 % (ref 3.0–12.0)
Neutro Abs: 2.4 10*3/uL (ref 1.4–7.7)
Neutrophils Relative %: 58.9 % (ref 43.0–77.0)
Platelets: 169 10*3/uL (ref 150.0–400.0)
RBC: 4.71 Mil/uL (ref 4.22–5.81)
RDW: 17.7 % — ABNORMAL HIGH (ref 11.5–15.5)
WBC: 4.1 10*3/uL (ref 4.0–10.5)

## 2020-08-02 MED ORDER — IPRATROPIUM BROMIDE 0.02 % IN SOLN: RESPIRATORY_TRACT | 3 refills | Status: AC

## 2020-08-02 NOTE — Patient Instructions (Signed)
Consider shingles vaccine at pharmacy at some point this year.    We will call with lab results done today.

## 2020-08-02 NOTE — Progress Notes (Signed)
Established Patient Office Visit  Subjective:  Patient ID: [name], male    DOB: November 20, 1932  Age: 87 y.o. MRN: [id number]  CC:  Chief Complaint  Patient presents with  . Annual Exam    HPI [name] presents for annual medical follow-up.  He is accompanied by his wife who is his caregiver.  He has fairly advanced dementia and communicates very little.  His other medical problem include history of hypertension, CAD, atrial fibrillation, chronic diastolic heart failure combined with history of systolic heart failure, history of pulmonary emboli, COPD, history of upper GI bleed secondary to gastric ulcer, GERD, history of esophageal stenosis, chronic kidney disease, history of B12 deficiency, hyperlipidemia, protein calorie malnutrition, adult failure to thrive  His wife is done tremendous job with caring for him over the years.  He has chronic incontinence and amazingly has not had any major skin ulcers.  His medications are reviewed.  Compliant with all.  Only uses his nebulizers as needed.  She is requesting refill of ipratropium today.  Past Medical History:  Diagnosis Date  . Anemia   . ASD (atrial septal defect)    a. s/p repair 1983.  [name] BPH (benign prostatic hyperplasia)   . Cerebrovascular accident ([place]) [place]  . Chronic bronchitis ([place])    a. h/o resp failure in setting of bronchitis vs PNA 05/2012.  [name] Chronic diarrhea   . COPD (chronic obstructive pulmonary disease) ([place])   . Coronary artery disease    a. Nonobstructive by cath 2009 and 09/06/14  . Depression   . Dysphagia, unspecified(787.20)   . Erosive gastritis    H/o GIB felt secondary to this  . Esophagitis   . Falls frequently   . Gastric polyp   . Gastric ulcer   . GERD (gastroesophageal reflux disease)   . Hearing impairment   . Hiatal hernia   . History of upper gastrointestinal hemorrhage    dr. Maurene Capes, GI  . Hyperlipidemia   . Hypertension   . Internal hemorrhoids   . LV dysfunction    a. EF  45-50% by echo 05/2012.  [name] Permanent atrial fibrillation (HCC)    INR followed at [facility].  [name] Vision problems     Past Surgical History:  Procedure Laterality Date  . ASD REPAIR  1983  . CARDIOVERSION     possibly 3 times, dr. brody/cooper  . CHOLECYSTECTOMY  1990s  . ENTEROSCOPY  05/13/2012   Procedure: ENTEROSCOPY;  Surgeon: Inda Castle, MD;  Location: [facility] END[facility];  Service: Endoscopy;  Laterality: N/A;  . [place]  . LEFT HEART CATHETERIZATION WITH CORONARY ANGIOGRAM N/A 09/06/2014   Procedure: LEFT HEART CATHETERIZATION WITH CORONARY ANGIOGRAM;  Surgeon: Jettie Booze, MD;  Location: [facility] CATH LAB;  Service: Cardiovascular;  Laterality: N/A;  . partial small bowel resection  2005   ischemic?    Family History  Problem Relation Age of Onset  . Heart disease Mother   . Heart disease Father   . Prostate cancer Brother     Social History   Socioeconomic History  . Marital status: Married    Spouse name: [name]  . Number of children: 1  . Years of education: Not on file  . Highest education level: Not on file  Occupational History  . Not on file  Tobacco Use  . Smoking status: Former Smoker    Packs/day: 0.00    Years: 0.00    Pack years: 0.00  Types: Cigarettes    Quit date: 06/13/1983    Years since quitting: 37.1  . Smokeless tobacco: Never Used  Vaping Use  . Vaping Use: Never used  Substance and Sexual Activity  . Alcohol use: No  . Drug use: No  . Sexual activity: Not on file  Other Topics Concern  . Not on file  Social History Narrative   Lives in [place] with wife.  Several falls recently, 4 in the last month.        [name]  [phone]               Social Determinants of Health   Financial Resource Strain: Not on file  Food Insecurity: Not on file  Transportation Needs: Not on file  Physical Activity: Not on file  Stress: Not on file  Social Connections: Not on file  Intimate Partner Violence: Not on  file    Outpatient Medications Prior to Visit  Medication Sig Dispense Refill  . buPROPion (WELLBUTRIN XL) 150 MG 24 hr tablet TAKE 1 TABLET EVERY DAY 30 tablet 6  . clotrimazole-betamethasone (LOTRISONE) cream APPLY TO AFFECTED AREAS TWICE A DAY (Patient not taking: Reported on 11/11/2019) 45 g 0  . diltiazem (CARDIZEM CD) 180 MG 24 hr capsule Take 1 capsule (180 mg total) by mouth daily. 90 capsule 0  . dutasteride (AVODART) 0.5 MG capsule TAKE (1) CAPSULE DAILY 90 capsule 0  . furosemide (LASIX) 20 MG tablet Take 1 tablet (20 mg total) by mouth as needed. For edema 30 tablet 6  . isosorbide mononitrate (IMDUR) 30 MG 24 hr tablet TAKE 1 TABLET DAILY 90 tablet 0  . levalbuterol (XOPENEX) 0.63 MG/3ML nebulizer solution NEBULIZE 1 VIAL (WITH IPRATROPIUM) TWICE A DAY AS NEEDED. MAY USE EXTR A EVERY 4 HOURS IF NEEDED 225 mL 0  . mirtazapine (REMERON) 15 MG tablet TAKE 1 TABLET DAILY 90 tablet 0  . mupirocin ointment (BACTROBAN) 2 % Apply 1 application topically 2 (two) times daily. (Patient not taking: Reported on 11/11/2019)    . nitroGLYCERIN (NITROSTAT) 0.4 MG SL tablet Place 1 tablet (0.4 mg total) under the tongue every 5 (five) minutes x 3 doses as needed for chest pain. 25 tablet 3  . pantoprazole (PROTONIX) 40 MG tablet Take 1 tablet (40 mg total) by mouth daily. 30 tablet 11  . potassium chloride (KLOR-CON 10) 10 MEQ tablet Take two tablets once daily on days taking Furosemide. 60 tablet 3  . saccharomyces boulardii (FLORASTOR) 250 MG capsule Take 250 mg by mouth 2 (two) times daily.    . simvastatin (ZOCOR) 20 MG tablet Take 1 tablet (20 mg total) by mouth at bedtime. 90 tablet 0  . vitamin B-12 (CYANOCOBALAMIN) 500 MCG tablet Take 500 mcg by mouth daily.    Alveda Reasons 20 MG TABS tablet TAKE 1 TABLET DAILY WITH SUPPER 90 tablet 0  . ipratropium (ATROVENT) 0.02 % nebulizer solution NEBULIZE 1 VAIL 4 TIMES A DAY AS DIRECTED (Patient taking differently: NEBULIZE 1 VAIL  A DAY AS needed) 312.5  mL 0   No facility-administered medications prior to visit.    Allergies  Allergen Reactions  . Lovenox [Enoxaparin Sodium] Swelling  . Tape Other (See Comments)    SKIN WILL TEAR IF TAPED (MUST BE LIGHT & BRIEF OR USE COBAN WRAP, PLEASE!!)  . Penicillins Hives    Has patient had a PCN reaction causing immediate rash, facial/tongue/throat swelling, SOB or lightheadedness with hypotension: Yes Has patient had a PCN reaction causing  severe rash involving mucus membranes or skin necrosis: No Has patient had a PCN reaction that required hospitalization: Unknown Has patient had a PCN reaction occurring within the last 10 years: No If all of the above answers are "NO", then may proceed with Cephalosporin use.     ROS Review of Systems Patient has advanced dementia and history not obtainable from patient.  His wife provides much of the history.  No recent falls.  No constipation issues.  He is incontinent of stool and urine and wears depends regularly.   Objective:    Physical Exam Vitals reviewed.  Constitutional:      Comments: Thin alert 87 year old gentleman in no distress  Cardiovascular:     Comments: Irregular rhythm but rate controlled Pulmonary:     Effort: Pulmonary effort is normal.     Breath sounds: Normal breath sounds.  Abdominal:     Comments: Did not attempt to get patient on table.  Abdomen is soft.  Musculoskeletal:     Cervical back: Neck supple.     Right lower leg: No edema.     Left lower leg: No edema.  Neurological:     Comments: No focal deficits.  Generally weak throughout.     BP (!) 158/84   Pulse 91   Ht 5\' 11"  (1.803 m)   Wt 133 lb (60.3 kg)   SpO2 92%   BMI 18.55 kg/m  Wt Readings from Last 3 Encounters:  08/02/20 133 lb (60.3 kg)  11/17/19 138 lb 14.4 oz (63 kg)  11/11/19 142 lb (64.4 kg)     Health Maintenance Due  Topic Date Due  . TETANUS/TDAP  Never done  . COVID-19 Vaccine (3 - Moderna risk 4-dose series) 10/06/2019     There are no preventive care reminders to display for this patient.  Lab Results  Component Value Date   TSH 1.206 09/30/2017   Lab Results  Component Value Date   WBC 5.3 12/09/2019   HGB 11.4 (L) 12/09/2019   HCT 35.8 (L) 12/09/2019   MCV 79.0 12/09/2019   PLT 191.0 12/09/2019   Lab Results  Component Value Date   NA 140 11/11/2019   K 3.8 11/11/2019   CO2 30 11/11/2019   GLUCOSE 107 (H) 11/11/2019   BUN 20 11/11/2019   CREATININE 0.97 11/11/2019   BILITOT 0.5 11/11/2019   ALKPHOS 68 11/11/2019   AST 20 11/11/2019   ALT 14 11/11/2019   PROT 7.1 11/11/2019   ALBUMIN 4.2 11/11/2019   CALCIUM 9.1 11/11/2019   ANIONGAP 6 01/02/2018   GFR 73.49 11/11/2019   Lab Results  Component Value Date   CHOL 131 02/05/2019   Lab Results  Component Value Date   HDL 51.90 02/05/2019   Lab Results  Component Value Date   LDLCALC 51 02/05/2019   Lab Results  Component Value Date   TRIG 144.0 02/05/2019   Lab Results  Component Value Date   CHOLHDL 3 02/05/2019   Lab Results  Component Value Date   HGBA1C  06/26/2007    6.0 (NOTE)   The ADA recommends the following therapeutic goals for glycemic   control related to Hgb A1C measurement:   Goal of Therapy:   < 7.0% Hgb A1C   Action Suggested:  > 8.0% Hgb A1C   Ref:  Diabetes Care, 22, Suppl. 1, 1999      Assessment & Plan:   Problem List Items Addressed This Visit      Unprioritized  B12 deficiency   Relevant Orders   Vitamin B12   Chronic diastolic (congestive) heart failure (HCC)   Relevant Orders   CMP   Chronic respiratory failure (HCC)   Essential hypertension - Primary   Relevant Orders   CMP   Hyperlipidemia   Relevant Orders   Lipid panel   Hepatic function panel    Other Visit Diagnoses    Weight loss       Relevant Orders   CBC with Differential/Platelet   TSH    Patient has had adult failure to thrive and protein calorie malnutrition for some time.  His weights have fluctuated  greatly over the years.  His wife continues to do tremendous job taking care of him. -Obtain screening labs as above -Refilled ipratropium by nebulizer which he uses as needed -We did discuss possible shingles vaccine which they will consider at pharmacy.  Other immunizations up-to-date. -Would have low threshold to consider palliative care or even hospice as his condition continues to deteriorate.  Wife does not feel any need for additional help at this time.  Meds ordered this encounter  Medications  . ipratropium (ATROVENT) 0.02 % nebulizer solution    Sig: Nebulize one vial every 6 hours as needed.    Dispense:  312.5 mL    Refill:  3    Follow-up: No follow-ups on file.    Carolann Littler, MD

## 2020-08-03 LAB — TSH: TSH: 2.49 u[IU]/mL (ref 0.35–4.50)

## 2020-08-03 LAB — VITAMIN B12: Vitamin B-12: 531 pg/mL (ref 211–911)

## 2020-08-04 ENCOUNTER — Telehealth: Payer: Self-pay

## 2020-08-04 NOTE — Telephone Encounter (Signed)
-----   Message from [name], MD sent at 08/04/2020  7:28 AM EST ----- Labs all stable.  Hemoglobin is slightly low but is stable compared to prior values.  Given his age and multiple comorbidities would not recommend further aggressive work-up at this time.

## 2020-08-04 NOTE — Telephone Encounter (Signed)
Left message for patient to call back to discuss results and recommendations.

## 2020-08-07 ENCOUNTER — Other Ambulatory Visit: Payer: Self-pay | Admitting: Family Medicine

## 2020-08-09 ENCOUNTER — Other Ambulatory Visit: Payer: Self-pay | Admitting: Family Medicine

## 2020-09-02 ENCOUNTER — Other Ambulatory Visit: Payer: Self-pay | Admitting: Family Medicine

## 2020-09-20 ENCOUNTER — Other Ambulatory Visit: Payer: Self-pay | Admitting: Family Medicine

## 2020-10-04 ENCOUNTER — Telehealth: Payer: Self-pay

## 2020-10-04 NOTE — Telephone Encounter (Signed)
FYI:  I called and spoke with the pts wife. She would like the O2 to "hype him up" she gave the pt a breathing tx today and this seemed to help some. I explained that the office visit is needed so that when we order the 02 we can send a copy of the notes for medical necessity, do an exam and possibly labs.   She is aware that he is to go to the ER for any shortness of breath in the mean time.

## 2020-10-04 NOTE — Telephone Encounter (Signed)
Pts wife called asking for an appointment for the pt. She would like for him to start oxygen. Margaret from front desk called back to get the okay to use either the 3:00 or 4:45 slots since they are 15 minutes. Are you okay with this?

## 2020-10-04 NOTE — Telephone Encounter (Signed)
Pt has been scheduled for an appointment

## 2020-10-05 ENCOUNTER — Other Ambulatory Visit: Payer: Self-pay | Admitting: Family Medicine

## 2020-10-05 NOTE — Telephone Encounter (Signed)
Created in error

## 2020-10-06 ENCOUNTER — Ambulatory Visit: Payer: Medicare PPO | Admitting: Family Medicine

## 2020-10-26 ENCOUNTER — Other Ambulatory Visit: Payer: Self-pay | Admitting: Family Medicine

## 2020-10-31 ENCOUNTER — Other Ambulatory Visit: Payer: Self-pay | Admitting: Family Medicine

## 2020-11-08 ENCOUNTER — Ambulatory Visit: Payer: Medicare PPO | Admitting: Family Medicine

## 2020-11-08 ENCOUNTER — Other Ambulatory Visit: Payer: Self-pay

## 2020-11-08 ENCOUNTER — Other Ambulatory Visit: Payer: Self-pay | Admitting: Family Medicine

## 2020-11-19 ENCOUNTER — Inpatient Hospital Stay (HOSPITAL_COMMUNITY): Payer: Medicare PPO

## 2020-11-19 ENCOUNTER — Emergency Department (HOSPITAL_COMMUNITY): Payer: Medicare PPO

## 2020-11-19 ENCOUNTER — Encounter (HOSPITAL_COMMUNITY)
Admission: EM | Disposition: A | Discharge status: Hospice | Payer: Self-pay | Source: Home / Self Care | Attending: Family Medicine

## 2020-11-19 ENCOUNTER — Inpatient Hospital Stay (HOSPITAL_COMMUNITY)
Admission: EM | Admit: 2020-11-19 | Discharge: 2020-11-24 | DRG: 208 | Disposition: A | Discharge status: Hospice | Payer: Medicare PPO | Attending: Family Medicine | Admitting: Family Medicine

## 2020-11-19 ENCOUNTER — Other Ambulatory Visit: Payer: Self-pay

## 2020-11-19 DIAGNOSIS — F0390 Unspecified dementia without behavioral disturbance: Secondary | ICD-10-CM | POA: Diagnosis not present

## 2020-11-19 DIAGNOSIS — K3189 Other diseases of stomach and duodenum: Secondary | ICD-10-CM | POA: Diagnosis not present

## 2020-11-19 DIAGNOSIS — E870 Hyperosmolality and hypernatremia: Secondary | ICD-10-CM | POA: Diagnosis not present

## 2020-11-19 DIAGNOSIS — I2510 Atherosclerotic heart disease of native coronary artery without angina pectoris: Secondary | ICD-10-CM | POA: Diagnosis present

## 2020-11-19 DIAGNOSIS — J9622 Acute and chronic respiratory failure with hypercapnia: Secondary | ICD-10-CM | POA: Diagnosis present

## 2020-11-19 DIAGNOSIS — R0902 Hypoxemia: Secondary | ICD-10-CM | POA: Diagnosis not present

## 2020-11-19 DIAGNOSIS — Z681 Body mass index (BMI) 19 or less, adult: Secondary | ICD-10-CM | POA: Diagnosis not present

## 2020-11-19 DIAGNOSIS — Z7189 Other specified counseling: Secondary | ICD-10-CM | POA: Diagnosis not present

## 2020-11-19 DIAGNOSIS — T18108A Unspecified foreign body in esophagus causing other injury, initial encounter: Secondary | ICD-10-CM

## 2020-11-19 DIAGNOSIS — Z20822 Contact with and (suspected) exposure to covid-19: Secondary | ICD-10-CM | POA: Diagnosis present

## 2020-11-19 DIAGNOSIS — E785 Hyperlipidemia, unspecified: Secondary | ICD-10-CM | POA: Diagnosis present

## 2020-11-19 DIAGNOSIS — I959 Hypotension, unspecified: Secondary | ICD-10-CM | POA: Diagnosis not present

## 2020-11-19 DIAGNOSIS — J9690 Respiratory failure, unspecified, unspecified whether with hypoxia or hypercapnia: Secondary | ICD-10-CM | POA: Diagnosis not present

## 2020-11-19 DIAGNOSIS — J439 Emphysema, unspecified: Secondary | ICD-10-CM | POA: Diagnosis present

## 2020-11-19 DIAGNOSIS — E43 Unspecified severe protein-calorie malnutrition: Secondary | ICD-10-CM | POA: Diagnosis present

## 2020-11-19 DIAGNOSIS — K2100 Gastro-esophageal reflux disease with esophagitis, without bleeding: Secondary | ICD-10-CM | POA: Diagnosis present

## 2020-11-19 DIAGNOSIS — J9621 Acute and chronic respiratory failure with hypoxia: Secondary | ICD-10-CM | POA: Diagnosis present

## 2020-11-19 DIAGNOSIS — R64 Cachexia: Secondary | ICD-10-CM | POA: Diagnosis present

## 2020-11-19 DIAGNOSIS — Z86711 Personal history of pulmonary embolism: Secondary | ICD-10-CM

## 2020-11-19 DIAGNOSIS — J9600 Acute respiratory failure, unspecified whether with hypoxia or hypercapnia: Secondary | ICD-10-CM

## 2020-11-19 DIAGNOSIS — K449 Diaphragmatic hernia without obstruction or gangrene: Secondary | ICD-10-CM | POA: Diagnosis not present

## 2020-11-19 DIAGNOSIS — R739 Hyperglycemia, unspecified: Secondary | ICD-10-CM | POA: Diagnosis not present

## 2020-11-19 DIAGNOSIS — Z66 Do not resuscitate: Secondary | ICD-10-CM | POA: Diagnosis present

## 2020-11-19 DIAGNOSIS — I4892 Unspecified atrial flutter: Secondary | ICD-10-CM | POA: Diagnosis not present

## 2020-11-19 DIAGNOSIS — I2721 Secondary pulmonary arterial hypertension: Secondary | ICD-10-CM | POA: Diagnosis present

## 2020-11-19 DIAGNOSIS — I10 Essential (primary) hypertension: Secondary | ICD-10-CM | POA: Diagnosis present

## 2020-11-19 DIAGNOSIS — I4821 Permanent atrial fibrillation: Secondary | ICD-10-CM | POA: Diagnosis present

## 2020-11-19 DIAGNOSIS — R29818 Other symptoms and signs involving the nervous system: Secondary | ICD-10-CM | POA: Diagnosis not present

## 2020-11-19 DIAGNOSIS — R296 Repeated falls: Secondary | ICD-10-CM | POA: Diagnosis not present

## 2020-11-19 DIAGNOSIS — K219 Gastro-esophageal reflux disease without esophagitis: Secondary | ICD-10-CM | POA: Diagnosis not present

## 2020-11-19 DIAGNOSIS — R0689 Other abnormalities of breathing: Secondary | ICD-10-CM | POA: Diagnosis not present

## 2020-11-19 DIAGNOSIS — J9811 Atelectasis: Secondary | ICD-10-CM | POA: Diagnosis not present

## 2020-11-19 DIAGNOSIS — K222 Esophageal obstruction: Secondary | ICD-10-CM | POA: Diagnosis present

## 2020-11-19 DIAGNOSIS — L54 Erythema in diseases classified elsewhere: Secondary | ICD-10-CM | POA: Diagnosis present

## 2020-11-19 DIAGNOSIS — T68XXXA Hypothermia, initial encounter: Secondary | ICD-10-CM | POA: Diagnosis present

## 2020-11-19 DIAGNOSIS — Z4682 Encounter for fitting and adjustment of non-vascular catheter: Secondary | ICD-10-CM | POA: Diagnosis not present

## 2020-11-19 DIAGNOSIS — I4891 Unspecified atrial fibrillation: Secondary | ICD-10-CM | POA: Diagnosis present

## 2020-11-19 DIAGNOSIS — K2960 Other gastritis without bleeding: Secondary | ICD-10-CM | POA: Diagnosis present

## 2020-11-19 DIAGNOSIS — Z8042 Family history of malignant neoplasm of prostate: Secondary | ICD-10-CM

## 2020-11-19 DIAGNOSIS — J449 Chronic obstructive pulmonary disease, unspecified: Secondary | ICD-10-CM | POA: Diagnosis not present

## 2020-11-19 DIAGNOSIS — J690 Pneumonitis due to inhalation of food and vomit: Secondary | ICD-10-CM | POA: Diagnosis not present

## 2020-11-19 DIAGNOSIS — E876 Hypokalemia: Secondary | ICD-10-CM | POA: Diagnosis present

## 2020-11-19 DIAGNOSIS — Z7401 Bed confinement status: Secondary | ICD-10-CM | POA: Diagnosis not present

## 2020-11-19 DIAGNOSIS — Z951 Presence of aortocoronary bypass graft: Secondary | ICD-10-CM

## 2020-11-19 DIAGNOSIS — Z7901 Long term (current) use of anticoagulants: Secondary | ICD-10-CM

## 2020-11-19 DIAGNOSIS — Z79899 Other long term (current) drug therapy: Secondary | ICD-10-CM

## 2020-11-19 DIAGNOSIS — Z8711 Personal history of peptic ulcer disease: Secondary | ICD-10-CM

## 2020-11-19 DIAGNOSIS — E860 Dehydration: Secondary | ICD-10-CM | POA: Diagnosis present

## 2020-11-19 DIAGNOSIS — Z880 Allergy status to penicillin: Secondary | ICD-10-CM

## 2020-11-19 DIAGNOSIS — Z8673 Personal history of transient ischemic attack (TIA), and cerebral infarction without residual deficits: Secondary | ICD-10-CM

## 2020-11-19 DIAGNOSIS — R404 Transient alteration of awareness: Secondary | ICD-10-CM | POA: Diagnosis not present

## 2020-11-19 DIAGNOSIS — R000 Tachycardia, unspecified: Secondary | ICD-10-CM | POA: Diagnosis present

## 2020-11-19 DIAGNOSIS — I4510 Unspecified right bundle-branch block: Secondary | ICD-10-CM | POA: Diagnosis present

## 2020-11-19 DIAGNOSIS — Z4659 Encounter for fitting and adjustment of other gastrointestinal appliance and device: Secondary | ICD-10-CM

## 2020-11-19 DIAGNOSIS — R54 Age-related physical debility: Secondary | ICD-10-CM | POA: Diagnosis present

## 2020-11-19 DIAGNOSIS — E782 Mixed hyperlipidemia: Secondary | ICD-10-CM

## 2020-11-19 DIAGNOSIS — Z781 Physical restraint status: Secondary | ICD-10-CM

## 2020-11-19 DIAGNOSIS — T17908A Unspecified foreign body in respiratory tract, part unspecified causing other injury, initial encounter: Secondary | ICD-10-CM | POA: Diagnosis not present

## 2020-11-19 DIAGNOSIS — N400 Enlarged prostate without lower urinary tract symptoms: Secondary | ICD-10-CM | POA: Diagnosis present

## 2020-11-19 DIAGNOSIS — Z8249 Family history of ischemic heart disease and other diseases of the circulatory system: Secondary | ICD-10-CM

## 2020-11-19 DIAGNOSIS — K209 Esophagitis, unspecified without bleeding: Secondary | ICD-10-CM | POA: Diagnosis not present

## 2020-11-19 DIAGNOSIS — R279 Unspecified lack of coordination: Secondary | ICD-10-CM | POA: Diagnosis not present

## 2020-11-19 DIAGNOSIS — Z515 Encounter for palliative care: Secondary | ICD-10-CM

## 2020-11-19 DIAGNOSIS — T18128A Food in esophagus causing other injury, initial encounter: Secondary | ICD-10-CM | POA: Diagnosis present

## 2020-11-19 DIAGNOSIS — T17908D Unspecified foreign body in respiratory tract, part unspecified causing other injury, subsequent encounter: Secondary | ICD-10-CM | POA: Diagnosis not present

## 2020-11-19 DIAGNOSIS — Z888 Allergy status to other drugs, medicaments and biological substances status: Secondary | ICD-10-CM

## 2020-11-19 DIAGNOSIS — Z87891 Personal history of nicotine dependence: Secondary | ICD-10-CM

## 2020-11-19 HISTORY — PX: ESOPHAGOGASTRODUODENOSCOPY: SHX5428

## 2020-11-19 LAB — CBC WITH DIFFERENTIAL/PLATELET
Abs Immature Granulocytes: 0.02 10*3/uL (ref 0.00–0.07)
Basophils Absolute: 0.1 10*3/uL (ref 0.0–0.1)
Basophils Relative: 1 %
Eosinophils Absolute: 0.2 10*3/uL (ref 0.0–0.5)
Eosinophils Relative: 2 %
HCT: 44 % (ref 39.0–52.0)
Hemoglobin: 13.2 g/dL (ref 13.0–17.0)
Immature Granulocytes: 0 %
Lymphocytes Relative: 26 %
Lymphs Abs: 2.2 10*3/uL (ref 0.7–4.0)
MCH: 26.5 pg (ref 26.0–34.0)
MCHC: 30 g/dL (ref 30.0–36.0)
MCV: 88.4 fL (ref 80.0–100.0)
Monocytes Absolute: 0.6 10*3/uL (ref 0.1–1.0)
Monocytes Relative: 7 %
Neutro Abs: 5.3 10*3/uL (ref 1.7–7.7)
Neutrophils Relative %: 64 %
Platelets: 214 10*3/uL (ref 150–400)
RBC: 4.98 MIL/uL (ref 4.22–5.81)
RDW: 15.7 % — ABNORMAL HIGH (ref 11.5–15.5)
WBC: 8.3 10*3/uL (ref 4.0–10.5)
nRBC: 0 % (ref 0.0–0.2)

## 2020-11-19 LAB — BLOOD GAS, VENOUS
Acid-Base Excess: 2.5 mmol/L — ABNORMAL HIGH (ref 0.0–2.0)
Bicarbonate: 24.5 mmol/L (ref 20.0–28.0)
FIO2: 80
O2 Saturation: 83.2 %
Patient temperature: 36.5
pCO2, Ven: 66.3 mmHg — ABNORMAL HIGH (ref 44.0–60.0)
pH, Ven: 7.26 (ref 7.250–7.430)
pO2, Ven: 57.3 mmHg — ABNORMAL HIGH (ref 32.0–45.0)

## 2020-11-19 LAB — BLOOD GAS, ARTERIAL
Acid-Base Excess: 4.3 mmol/L — ABNORMAL HIGH (ref 0.0–2.0)
Bicarbonate: 27.3 mmol/L (ref 20.0–28.0)
FIO2: 100
O2 Saturation: 99.9 %
Patient temperature: 37
pCO2 arterial: 59.6 mmHg — ABNORMAL HIGH (ref 32.0–48.0)
pH, Arterial: 7.322 — ABNORMAL LOW (ref 7.350–7.450)
pO2, Arterial: 336 mmHg — ABNORMAL HIGH (ref 83.0–108.0)

## 2020-11-19 LAB — COMPREHENSIVE METABOLIC PANEL
ALT: 14 U/L (ref 0–44)
AST: 23 U/L (ref 15–41)
Albumin: 4.4 g/dL (ref 3.5–5.0)
Alkaline Phosphatase: 74 U/L (ref 38–126)
Anion gap: 8 (ref 5–15)
BUN: 28 mg/dL — ABNORMAL HIGH (ref 8–23)
CO2: 29 mmol/L (ref 22–32)
Calcium: 9 mg/dL (ref 8.9–10.3)
Chloride: 103 mmol/L (ref 98–111)
Creatinine, Ser: 0.78 mg/dL (ref 0.61–1.24)
GFR, Estimated: >60 mL/min (ref >60)
Glucose, Bld: 148 mg/dL — ABNORMAL HIGH (ref 70–99)
Potassium: 3.9 mmol/L (ref 3.5–5.1)
Sodium: 140 mmol/L (ref 135–145)
Total Bilirubin: 0.3 mg/dL (ref 0.3–1.2)
Total Protein: 7.6 g/dL (ref 6.5–8.1)

## 2020-11-19 LAB — RESP PANEL BY RT-PCR (FLU A&B, COVID) ARPGX2
Influenza A by PCR: NEGATIVE
Influenza B by PCR: NEGATIVE
SARS Coronavirus 2 by RT PCR: NEGATIVE

## 2020-11-19 SURGERY — EGD (ESOPHAGOGASTRODUODENOSCOPY)

## 2020-11-19 MED ORDER — CHLORHEXIDINE GLUCONATE 0.12% ORAL RINSE (MEDLINE KIT)
15.0000 mL | Freq: Two times a day (BID) | OROMUCOSAL | Status: DC
  Administered 2020-11-20 – 2020-11-24 (×5): 15 mL via OROMUCOSAL

## 2020-11-19 MED ORDER — PROPOFOL 10 MG/ML IV BOLUS: 50.0000 mg | Freq: Once | INTRAVENOUS | Status: AC

## 2020-11-19 MED ORDER — SODIUM CHLORIDE 0.9 % IV SOLN: 2.0000 g | Freq: Three times a day (TID) | INTRAVENOUS | Status: DC

## 2020-11-19 MED ORDER — PROPOFOL 10 MG/ML IV BOLUS
INTRAVENOUS | Status: AC
  Administered 2020-11-19: 50 mg via INTRAVENOUS
  Filled 2020-11-19: qty 20

## 2020-11-19 MED ORDER — PROPOFOL 1000 MG/100ML IV EMUL
5.0000 ug/kg/min | INTRAVENOUS | Status: DC
  Administered 2020-11-19: 5 ug/kg/min via INTRAVENOUS
  Administered 2020-11-19: 65 ug/kg/min via INTRAVENOUS
  Administered 2020-11-20: 20 ug/kg/min via INTRAVENOUS
  Filled 2020-11-19 (×3): qty 100

## 2020-11-19 MED ORDER — SUCCINYLCHOLINE CHLORIDE 20 MG/ML IJ SOLN
100.0000 mg | Freq: Once | INTRAMUSCULAR | Status: AC
  Administered 2020-11-19: 100 mg via INTRAVENOUS

## 2020-11-19 MED ORDER — CHLORHEXIDINE GLUCONATE CLOTH 2 % EX PADS
6.0000 | MEDICATED_PAD | Freq: Every day | CUTANEOUS | Status: DC
  Administered 2020-11-20 – 2020-11-23 (×4): 6 via TOPICAL

## 2020-11-19 MED ORDER — ACETAMINOPHEN 325 MG PO TABS: 650.0000 mg | ORAL_TABLET | Freq: Four times a day (QID) | ORAL | Status: DC | PRN

## 2020-11-19 MED ORDER — ONDANSETRON HCL 4 MG/2ML IJ SOLN: 4.0000 mg | Freq: Four times a day (QID) | INTRAMUSCULAR | Status: DC | PRN

## 2020-11-19 MED ORDER — IPRATROPIUM BROMIDE 0.02 % IN SOLN: 0.5000 mg | Freq: Four times a day (QID) | RESPIRATORY_TRACT | Status: DC | PRN

## 2020-11-19 MED ORDER — ACETAMINOPHEN 650 MG RE SUPP
650.0000 mg | Freq: Four times a day (QID) | RECTAL | Status: DC | PRN
  Administered 2020-11-20: 650 mg via RECTAL
  Filled 2020-11-19: qty 1

## 2020-11-19 MED ORDER — PROPOFOL 1000 MG/100ML IV EMUL
INTRAVENOUS | Status: AC
  Filled 2020-11-19: qty 100

## 2020-11-19 MED ORDER — ORAL CARE MOUTH RINSE
15.0000 mL | OROMUCOSAL | Status: DC
  Administered 2020-11-20 (×6): 15 mL via OROMUCOSAL

## 2020-11-19 MED ORDER — SODIUM CHLORIDE 0.9 % IV SOLN
2.0000 g | INTRAVENOUS | Status: DC
  Administered 2020-11-19 – 2020-11-23 (×5): 2 g via INTRAVENOUS
  Filled 2020-11-19 (×5): qty 20

## 2020-11-19 MED ORDER — ETOMIDATE 2 MG/ML IV SOLN
20.0000 mg | Freq: Once | INTRAVENOUS | Status: AC
  Administered 2020-11-19: 20 mg via INTRAVENOUS

## 2020-11-19 MED ORDER — LEVALBUTEROL HCL 0.63 MG/3ML IN NEBU: 0.6300 mg | INHALATION_SOLUTION | Freq: Two times a day (BID) | RESPIRATORY_TRACT | Status: DC | PRN

## 2020-11-19 MED ORDER — ISOSORBIDE MONONITRATE ER 30 MG PO TB24: 30.0000 mg | ORAL_TABLET | Freq: Every day | ORAL | Status: DC

## 2020-11-19 MED ORDER — PANTOPRAZOLE SODIUM 40 MG IV SOLR
40.0000 mg | Freq: Two times a day (BID) | INTRAVENOUS | Status: DC
  Administered 2020-11-20 – 2020-11-22 (×7): 40 mg via INTRAVENOUS
  Filled 2020-11-19 (×8): qty 40

## 2020-11-19 MED ORDER — ONDANSETRON HCL 4 MG PO TABS: 4.0000 mg | ORAL_TABLET | Freq: Four times a day (QID) | ORAL | Status: DC | PRN

## 2020-11-19 MED ORDER — DILTIAZEM HCL ER COATED BEADS 180 MG PO CP24: 180.0000 mg | ORAL_CAPSULE | Freq: Every day | ORAL | Status: DC

## 2020-11-19 MED ORDER — METRONIDAZOLE 500 MG/100ML IV SOLN
500.0000 mg | Freq: Three times a day (TID) | INTRAVENOUS | Status: DC
  Administered 2020-11-19 – 2020-11-24 (×14): 500 mg via INTRAVENOUS
  Filled 2020-11-19 (×14): qty 100

## 2020-11-19 MED ORDER — SIMVASTATIN 20 MG PO TABS: 20.0000 mg | ORAL_TABLET | Freq: Every day | ORAL | Status: DC

## 2020-11-19 NOTE — Progress Notes (Signed)
Placed on smaller VT due to one lung being occluded per MD. Bronchoscopy will be performed to try to dislodge foreign object. VT will be increased to 8ccs if object is removed.

## 2020-11-19 NOTE — ED Provider Notes (Signed)
[facility] EMERGENCY DEPARTMENT Provider Note   CSN: [id number] Arrival date & time: 11/19/20  1536     History Chief Complaint  Patient presents with   Choking    [name] is a 87 y.o. male.  HPI Patient seen by me on arrival to the emergency department.  History per EMS is that he choked on chicken.  He apparently spit up some chicken at the house, and during transport he spit out some chicken.  According to EMS he did not speak during transport.  The patient has dementia.  No family members with him on arrival.  On arrival patient with respiratory distress, and no air movement on the left.  Oxygen saturation initially 73% on room air increased to 88% with facemask oxygen.  Patient was tachypneic, hypertensive and tachycardic.  Decision made to intubate.    Past Medical History:  Diagnosis Date   Anemia    ASD (atrial septal defect)    a. s/p repair 1983.   BPH (benign prostatic hyperplasia)    Cerebrovascular accident ([place]) 1990   Chronic bronchitis ([place])    a. h/o resp failure in setting of bronchitis vs PNA 05/2012.   Chronic diarrhea    COPD (chronic obstructive pulmonary disease) (HCC)    Coronary artery disease    a. Nonobstructive by cath 2009 and 09/06/14   Depression    Dysphagia, unspecified(787.20)    Erosive gastritis    H/o GIB felt secondary to this   Esophagitis    Falls frequently    Gastric polyp    Gastric ulcer    GERD (gastroesophageal reflux disease)    Hearing impairment    Hiatal hernia    History of upper gastrointestinal hemorrhage    dr. Maurene Capes, GI   Hyperlipidemia    Hypertension    Internal hemorrhoids    LV dysfunction    a. EF 45-50% by echo 05/2012.   Permanent atrial fibrillation (HCC)    INR followed at [facility].   Vision problems     Patient Active Problem List   Diagnosis Date Noted   Aspiration pneumonia ([place]) 11/19/2020   Acute on chronic respiratory failure with hypoxia and hypercapnia ([place]) 11/19/2020   Hypothermia  11/19/2020   Frequent falls 12/31/2017   Palliative care by specialist    Advance care planning    Goals of care, counseling/discussion    Dementia ([place]) 09/30/2017   Chest pain 09/29/2017   Elevated PSA 02/02/2016   Pulmonary emboli ([place]) 01/11/2016   Actinic keratoses 11/16/2015   Memory loss 11/16/2015   Atypical chest pain 01/28/2015   Right carotid bruit 09/26/2014   Abnormal stress test    Chronic atrial fibrillation ([place]) 09/04/2014   On warfarin therapy 09/04/2014   Encounter for therapeutic drug monitoring 07/26/2013   Bradycardia 02/17/2013   Acute on chronic systolic CHF (congestive heart failure) ([place]) 11/16/2012   Acute on chronic renal failure ([place]) 11/16/2012   Failure to thrive in adult 10/30/2012   Anemia of chronic disease 10/13/2012   Toxic encephalopathy secondary to UTI 10/12/2012   Depression 10/12/2012   Protein-calorie malnutrition ([place]) 10/12/2012   Stage III chronic kidney disease ([place]) 10/12/2012   CHF (congestive heart failure) ([place]) 09/06/2012   Chronic diastolic (congestive) heart failure ([place]) 07/23/2012   Hypokalemia 07/20/2012   Hypomagnesemia 07/20/2012   Hypocalcemia 07/20/2012   Acute kidney injury ([place]) 07/20/2012   Chronic diarrhea 07/20/2012   Falls frequently 07/20/2012   Leukocytosis 07/20/2012   Normocytic anemia 07/20/2012  Chronic respiratory failure ([place]) 05/23/2012   COPD (chronic obstructive pulmonary disease) ([place]) 05/22/2012   Gastritis with hemorrhage 05/13/2012   Stricture and stenosis of esophagus 05/13/2012   Acute posthemorrhagic anemia 05/12/2012   Atrial fibrillation with rapid ventricular response ([place]) 05/06/2012   Dysphagia 05/06/2012   HTN (hypertension) 05/06/2012   H/O: CVA (cerebrovascular accident) 05/06/2012   GERD (gastroesophageal reflux disease) 05/06/2012   Purulent bronchitis ([place]) 05/06/2012   SOB (shortness of breath) 03/19/2010   ABDOMINAL DISTENSION 03/19/2010   FATIGUE 11/09/2009   B12  deficiency 07/26/2009   Hyperglycemia 07/26/2009   Hyperlipidemia 07/20/2009   Hereditary and idiopathic peripheral neuropathy 07/20/2009   BPH (benign prostatic hyperplasia) 07/20/2009   Essential hypertension 09/05/2007   Coronary atherosclerosis 09/05/2007   FIBRILLATION, ATRIAL 09/05/2007   Cerebral artery occlusion with cerebral infarction ([place]) 09/05/2007   ESOPHAGITIS 09/05/2007   GERD 09/05/2007   EROSIVE GASTRITIS 09/05/2007   UPPER GASTROINTESTINAL HEMORRHAGE 09/05/2007   DYSPHAGIA UNSPECIFIED 09/05/2007   INTERNAL HEMORRHOIDS 06/10/2003   GASTRIC POLYP 12/03/2001   Hiatal hernia 12/03/2001   COLONIC POLYPS 09/25/1992   GASTRIC ULCER 09/22/1992    Past Surgical History:  Procedure Laterality Date   ASD REPAIR  1983   CARDIOVERSION     possibly 3 times, dr. brody/cooper   CHOLECYSTECTOMY  1990s   ENTEROSCOPY  05/13/2012   Procedure: ENTEROSCOPY;  Surgeon: Inda Castle, MD;  Location: [facility];  Service: Endoscopy;  Laterality: N/A;   INGUINAL HERNIA REPAIR  1963   LEFT HEART CATHETERIZATION WITH CORONARY ANGIOGRAM N/A 09/06/2014   Procedure: LEFT HEART CATHETERIZATION WITH CORONARY ANGIOGRAM;  Surgeon: Jettie Booze, MD;  Location: [facility] CATH LAB;  Service: Cardiovascular;  Laterality: N/A;   partial small bowel resection  2005   ischemic?       Family History  Problem Relation Age of Onset   Heart disease Mother    Heart disease Father    Prostate cancer Brother     Social History   Tobacco Use   Smoking status: Former    Packs/day: 0.00    Years: 0.00    Pack years: 0.00    Types: Cigarettes    Quit date: 06/13/1983    Years since quitting: 37.4   Smokeless tobacco: Never  Vaping Use   Vaping Use: Never used  Substance Use Topics   Alcohol use: No   Drug use: No    Home Medications Prior to Admission medications   Medication Sig Start Date End Date Taking? Authorizing Provider  buPROPion (WELLBUTRIN XL) 150 MG 24 hr tablet TAKE 1  TABLET EVERY DAY Patient taking differently: Take 150 mg by mouth daily. 10/05/20  Yes Burchette, Alinda Sierras, MD  diltiazem (CARDIZEM CD) 180 MG 24 hr capsule TAKE 1 CAPSULE ONCE DAILY Patient taking differently: Take 180 mg by mouth daily. 10/27/20  Yes Burchette, Alinda Sierras, MD  dutasteride (AVODART) 0.5 MG capsule TAKE (1) CAPSULE DAILY Patient taking differently: Take 0.5 mg by mouth daily. TAKE (1) CAPSULE DAILY 11/09/20  Yes Burchette, Alinda Sierras, MD  furosemide (LASIX) 20 MG tablet Take 1 tablet (20 mg total) by mouth as needed. For edema 11/17/19  Yes Burchette, Alinda Sierras, MD  ipratropium (ATROVENT) 0.02 % nebulizer solution Nebulize one vial every 6 hours as needed. Patient taking differently: Take 0.5 mg by nebulization every 6 (six) hours as needed for wheezing or shortness of breath. Nebulize one vial every 6 hours as needed. 08/02/20  Yes Burchette, Alinda Sierras, MD  isosorbide  mononitrate (IMDUR) 30 MG 24 hr tablet TAKE 1 TABLET DAILY Patient taking differently: Take 30 mg by mouth daily. 11/09/20  Yes Burchette, Alinda Sierras, MD  levalbuterol (XOPENEX) 0.63 MG/3ML nebulizer solution NEBULIZE 1 VIAL (WITH IPRATROPIUM) TWICE A DAY AS NEEDED. MAY USE EXTR A EVERY 4 HOURS IF NEEDED Patient taking differently: Take 0.63 mg by nebulization 2 (two) times daily as needed for wheezing or shortness of breath. NEBULIZE 1 VIAL (WITH IPRATROPIUM) TWICE A DAY AS NEEDED. MAY USE EXTR A EVERY 4 HOURS IF NEEDED 07/10/20  Yes Burchette, Alinda Sierras, MD  mirtazapine (REMERON) 15 MG tablet TAKE 1 TABLET DAILY Patient taking differently: Take 15 mg by mouth at bedtime. 09/20/20  Yes Burchette, Alinda Sierras, MD  nitroGLYCERIN (NITROSTAT) 0.4 MG SL tablet Place 1 tablet (0.4 mg total) under the tongue every 5 (five) minutes x 3 doses as needed for chest pain. 08/20/14  Yes Theora Gianotti, NP  pantoprazole (PROTONIX) 40 MG tablet Take 1 tablet (40 mg total) by mouth daily. 11/11/19  Yes Esterwood, Amy S, PA-C  potassium chloride (KLOR-CON  10) 10 MEQ tablet Take two tablets once daily on days taking Furosemide. Patient taking differently: Take 20 mEq by mouth daily as needed (supplement with Furosemide). Take two tablets once daily on days taking Furosemide. 11/17/19  Yes Burchette, Alinda Sierras, MD  saccharomyces boulardii (FLORASTOR) 250 MG capsule Take 250 mg by mouth 2 (two) times daily.   Yes [provider]  simvastatin (ZOCOR) 20 MG tablet TAKE 1 TABLET AT BEDTIME Patient taking differently: Take 20 mg by mouth at bedtime. 10/31/20  Yes Burchette, Alinda Sierras, MD  vitamin B-12 (CYANOCOBALAMIN) 500 MCG tablet Take 500 mcg by mouth daily.   Yes [provider]  XARELTO 20 MG TABS tablet TAKE 1 TABLET DAILY WITH SUPPER Patient taking differently: Take 20 mg by mouth daily with supper. 09/04/20  Yes Burchette, Alinda Sierras, MD  clotrimazole-betamethasone (LOTRISONE) cream APPLY TO AFFECTED AREAS TWICE A DAY Patient not taking: No sig reported 06/26/16   [name], MD    Allergies    Lovenox [enoxaparin sodium], Penicillins, and Tape  Review of Systems   Review of Systems  Unable to perform ROS: Acuity of condition   Physical Exam Updated Vital Signs BP (!) 126/95   Pulse 66   Temp (!) 95.72 F (35.4 C)   Resp 18   Ht 5\' 11"  (1.803 m)   Wt 60.3 kg   SpO2 100%   BMI 18.54 kg/m   Physical Exam Vitals and nursing note reviewed.  Constitutional:      Appearance: He is well-developed. He is not ill-appearing.     Comments: Elderly, frail  HENT:     Head: Normocephalic and atraumatic.     Right Ear: External ear normal.     Left Ear: External ear normal.     Nose: No congestion or rhinorrhea.  Eyes:     Conjunctiva/sclera: Conjunctivae normal.     Pupils: Pupils are equal, round, and reactive to light.  Neck:     Trachea: Phonation normal.  Cardiovascular:     Rate and Rhythm: Normal rate and regular rhythm.     Heart sounds: Normal heart sounds.  Pulmonary:     Effort: Pulmonary effort is normal.      Breath sounds: No wheezing.     Comments: Absent air movement in left chest.  Left chest hyperexpanded. Abdominal:     General: There is no distension.  Palpations: Abdomen is soft.     Tenderness: There is no abdominal tenderness.  Musculoskeletal:        General: No swelling, tenderness or signs of injury. Normal range of motion.     Cervical back: Normal range of motion and neck supple.  Skin:    General: Skin is warm and dry.     Coloration: Skin is not jaundiced.  Neurological:     Mental Status: He is alert.     Cranial Nerves: Cranial nerve deficit present.     Motor: No abnormal muscle tone.     Comments: Patient is able to follow command to open mouth.  No localized asymmetry of face, arms or legs.  Patient is in distress, further neurologic exam deferred.  Psychiatric:     Comments: Nonverbal, in distress    ED Results / Procedures / Treatments   Labs (all labs ordered are listed, but only abnormal results are displayed) Labs Reviewed  COMPREHENSIVE METABOLIC PANEL - Abnormal; Notable for the following components:      Result Value   Glucose, Bld 148 (*)    BUN 28 (*)    All other components within normal limits  CBC WITH DIFFERENTIAL/PLATELET - Abnormal; Notable for the following components:   RDW 15.7 (*)    All other components within normal limits  BLOOD GAS, VENOUS - Abnormal; Notable for the following components:   pCO2, Ven 66.3 (*)    pO2, Ven 57.3 (*)    Acid-Base Excess 2.5 (*)    All other components within normal limits  BLOOD GAS, ARTERIAL - Abnormal; Notable for the following components:   pH, Arterial 7.322 (*)    pCO2 arterial 59.6 (*)    pO2, Arterial 336 (*)    Acid-Base Excess 4.3 (*)    All other components within normal limits  RESP PANEL BY RT-PCR (FLU A&B, COVID) ARPGX2  TSH    EKG EKG Interpretation  Date/Time:  Sunday November 19 2020 15:50:08 EDT Ventricular Rate:  107 PR Interval:    QRS Duration: 142 QT  Interval:  361 QTC Calculation: 482 R Axis:   107 Text Interpretation: Atrial fibrillation Right bundle branch block Premature ventricular complexes since last tracing no significant change Confirmed by Daleen Bo [phone]) on 11/19/2020 4:14:21 PM  Radiology CT Chest Wo Contrast  Result Date: 11/19/2020 CLINICAL DATA:  Provided history of pneumothorax. Patient choked on a piece of chicken. EXAM: CT CHEST WITHOUT CONTRAST TECHNIQUE: Multidetector CT imaging of the chest was performed following the standard protocol without IV contrast. COMPARISON:  Radiograph earlier today.  Chest CT 10 17 FINDINGS: Cardiovascular: Aortic atherosclerosis and tortuosity. Post median sternotomy and CABG. Prominent dilatation of the central pulmonary arteries with main pulmonary artery measuring 4 cm, right pulmonary artery measuring 4.2 cm and left pulmonary artery measuring 3.5 cm. Dense coronary artery calcification of native coronary arteries. Multi chamber cardiomegaly. No pericardial effusion. Mediastinum/Nodes: Large hiatal hernia with the entire stomach being intrathoracic. There is a granular axial a rotation of the stomach. The esophagus is dilated to the level of the thoracic inlet. Upper esophagus is patulous. No evidence of pneumomediastinum. Endotracheal tube tip above the carina. No enlarged mediastinal lymph nodes. Limited hilar assessment. No thyroid nodule. Lungs/Pleura: No pneumothorax. Chronic volume loss in the left lower lobe with occlusion of the left lower lobe bronchus, likely compressive. Chronic left basilar atelectasis. Minimal scarring in the anterior and lateral right upper lobe, similar to prior exam. Emphysema. Minimal right lower lobe  bronchiectasis. No acute confluent airspace disease. No pulmonary mass. No pleural fluid. Upper Abdomen: Large hiatal hernia with the entire stomach being intrathoracic. Cholecystectomy. No acute upper abdominal findings. Musculoskeletal: Exaggerated thoracic  kyphosis with bony under mineralization. Chronic L2 compression fracture is unchanged from 11/18/2019 abdominal CT. Median sternotomy. No acute osseous abnormalities are seen. IMPRESSION: 1. Large hiatal hernia with the entire stomach being intrathoracic. The esophagus is fluid-filled and dilated to the level of the thoracic inlet. No pneumomediastinum. 2. No pneumothorax. 3. Cardiomegaly post CABG. Prominent dilatation of the central pulmonary arteries consistent with pulmonary arterial hypertension. 4. Chronic volume loss in the left lower lobe with occlusion of the left lower lobe bronchus, likely compressive. Chronic left basilar atelectasis. 5. Emphysema. Aortic Atherosclerosis (ICD10-I70.0) and Emphysema (ICD10-J43.9). Electronically Signed   By: Keith Rake M.D.   On: 11/19/2020 17:06   DG Chest Port 1 View  Result Date: 11/19/2020 CLINICAL DATA:  OG tube placement EXAM: PORTABLE CHEST 1 VIEW COMPARISON:  11/19/2020 FINDINGS: Endotracheal tube terminates at the level of the clavicular heads. Interval placement of enteric tube with side port terminating at the level of the carina and distal tip terminating within the lower chest. Large hiatal hernia. Emphysematous changes of the lung fields. Left basilar opacities, better seen on recent CT. No pneumothorax. IMPRESSION: Interval placement of enteric tube. The distal tip is likely positioned within patient's large hiatal hernia. The side port is likely within the distal esophagus. Recommend advancement. Electronically Signed   By: Davina Poke D.O.   On: 11/19/2020 18:56   DG Chest Portable 1 View  Result Date: 11/19/2020 CLINICAL DATA:  Choking post intubation. EXAM: PORTABLE CHEST 1 VIEW COMPARISON:  December 31, 2017 FINDINGS: Endotracheal tube terminates 4 cm above the carina, appropriately position. Enlarged cardiac silhouette. No evidence of pneumothorax. Possible left pleural effusion. Osseous structures are without acute abnormality. Soft  tissues are grossly normal. IMPRESSION: 1. Endotracheal tube terminates 4 cm above the carina, appropriately positioned. 2. Enlarged cardiac silhouette. 3. Possible left pleural effusion. Electronically Signed   By: Fidela Salisbury M.D.   On: 11/19/2020 16:13    Procedures Procedure Name: Intubation Date/Time: 11/19/2020 5:48 PM Performed by: Daleen Bo, MD Pre-anesthesia Checklist: Patient identified, Patient being monitored, Emergency Drugs available, Timeout performed and Suction available Oxygen Delivery Method: Non-rebreather mask Preoxygenation: Pre-oxygenation with 100% oxygen Induction Type: Rapid sequence Tube size: 7.5 mm Number of attempts: 1 Placement Confirmation: ETT inserted through vocal cords under direct vision, CO2 detector and Breath sounds checked- equal and bilateral Tube secured with: ETT holder Dental Injury: Teeth and Oropharynx as per pre-operative assessment     Bedside bronchoscopy  Date/Time: 11/19/2020 5:49 PM Performed by: Daleen Bo, MD Authorized by: Daleen Bo, MD  Consent: Verbal consent obtained. Risks and benefits: risks, benefits and alternatives were discussed Consent given by: spouse Imaging studies: imaging studies available Patient identity confirmed: hospital-assigned identification number Time out: Immediately prior to procedure a "time out" was called to verify the correct patient, procedure, equipment, support staff and site/side marked as required. Local anesthesia used: no  Anesthesia: Local anesthesia used: no  Sedation: Patient sedated: yes Sedatives: propofol  Patient tolerance: patient tolerated the procedure well with no immediate complications Comments: Patient was previously sedated for ventilator management.  Bronchoscopy done to 4 levels on right, 5 levels on the left, no foreign body obstruction, fluid collection or significant mucus accumulation.   .Foreign Body Removal  Date/Time: 11/19/2020 6:08  PM Performed by: Daleen Bo, MD Authorized  by: Daleen Bo, MD  Consent: The procedure was performed in an emergent situation. Imaging studies: imaging studies available Patient identity confirmed: hospital-assigned identification number Time out: Immediately prior to procedure a "time out" was called to verify the correct patient, procedure, equipment, support staff and site/side marked as required. Intake: Proximal esophagus. Patient tolerance: patient tolerated the procedure well with no immediate complications Comments: Using glide scope for visualization, the proximal esophagus was visualized.  It was fluid-filled with gastric secretions.  There is also visible foreign body, chicken meat, present.  A large piece of chicken, 4 x 2 cm, was extracted using suction.  A smaller piece approximately 2 x 1 cm was also removed using suction.  I then passed an OG under direct visualization, into the stomach which is intrathoracic in location.  .Critical Care  Date/Time: 11/19/2020 6:19 PM Performed by: Daleen Bo, MD Authorized by: Daleen Bo, MD   Critical care provider statement:    Critical care time (minutes):  130   Critical care start time:  11/19/2020 3:35 PM   Critical care end time:  11/19/2020 6:19 PM   Critical care time was exclusive of:  Separately billable procedures and treating other patients   Critical care was necessary to treat or prevent imminent or life-threatening deterioration of the following conditions:  Respiratory failure   Critical care was time spent personally by me on the following activities:  Blood draw for specimens, development of treatment plan with patient or surrogate, discussions with consultants, evaluation of patient's response to treatment, examination of patient, obtaining history from patient or surrogate, ordering and performing treatments and interventions, ordering and review of laboratory studies, pulse oximetry, re-evaluation of patient's  condition, review of old charts and ordering and review of radiographic studies   Medications Ordered in ED Medications  propofol (DIPRIVAN) 1000 MG/100ML infusion (65 mcg/kg/min  60.3 kg Intravenous Rate/Dose Change 11/19/20 1732)  cefTRIAXone (ROCEPHIN) 2 g in sodium chloride 0.9 % 100 mL IVPB (2 g Intravenous New Bag/Given 11/19/20 2022)  metroNIDAZOLE (FLAGYL) IVPB 500 mg (has no administration in time range)  pantoprazole (PROTONIX) injection 40 mg (has no administration in time range)  succinylcholine (ANECTINE) injection 100 mg (100 mg Intravenous Given 11/19/20 1557)  etomidate (AMIDATE) injection 20 mg (20 mg Intravenous Given 11/19/20 1556)  propofol (DIPRIVAN) 10 mg/mL bolus/IV push 50 mg (50 mg Intravenous Given 11/19/20 1625)    ED Course  I have reviewed the triage vital signs and the nursing notes.  Pertinent labs & imaging results that were available during my care of the patient were reviewed by me and considered in my medical decision making (see chart for details).  Clinical Course as of 11/19/20 2114  Nancy Fetter Nov 19, 2020  1635 Chest x-ray findings discussed with radiologist Dr. Teressa Senter.  It appears to me that there is a left-sided pneumothorax, likely lower and posterior.  Dr. Jetta Lout is not convinced that there is pneumothorax.  CT chest ordered to clarify. [EW]  52 Patient's wife here now I discussed the findings and current plan with her.  She states that today, the patient was eating, and she saw that he was having trouble swallowing and heard him "gurgling."  She attempted to get him to spit into a paper towel, but he could not get much out.  She does not believe that he was choking.  She decided to call EMS because he was in distress.  She is agreeable to proceeding with further assessment.  She states that relative to  end-of-life care, she would not want aggressive measures done.  At this point we are in the diagnostic phase of the potential short-term illness that can  be quickly treated.  We will proceed with bronchoscopy. [EW]  [id number] Per radiologist, the OG tube appears to be in the distal esophagus.  Note that I tried multiple times to advance it further than this position and it did not advance, seeming to hit an obstruction.  This raises a suspicion of distal esophageal obstruction. [EW]  2012 Case discussed with gastroenterology, Dr. Melony Overly who will perform EGD in the ED, for possible distal esophageal obstruction. [EW]    Clinical Course User Index [EW] Daleen Bo, MD   MDM Rules/Calculators/A&P                           Patient Vitals for the past 24 hrs:  BP Temp Temp src Pulse Resp SpO2 Height Weight  11/19/20 2000 (!) 126/95 (!) 95.72 F (35.4 C) -- 66 18 100 % -- --  11/19/20 1930 121/81 -- -- -- 19 100 % -- --  11/19/20 1900 106/61 -- -- 62 18 100 % -- --  11/19/20 1830 118/85 -- -- 63 18 100 % -- --  11/19/20 1800 104/66 -- -- 68 18 93 % -- --  11/19/20 1730 (!) 152/135 -- -- 67 15 93 % -- --  11/19/20 1700 (!) 142/84 -- -- 66 (!) 27 99 % -- --  11/19/20 1640 127/74 -- -- -- (!) 23 -- -- --  11/19/20 1608 -- -- -- -- -- -- 5\' 11"  (1.803 m) 60.3 kg  11/19/20 1606 -- -- -- -- -- 97 % -- --  11/19/20 1600 (!) 157/91 -- -- 90 17 93 % -- --  11/19/20 1553 -- 97.7 F (36.5 C) Axillary -- -- -- -- --  11/19/20 1545 (!) 211/117 -- -- (!) 112 (!) 30 (!) 86 % -- --    6:21 PM Reevaluation with update and discussion. After initial assessment and treatment, an updated evaluation reveals stable under sedation with propofol on ventilator.  Patient's wife updated on findings and plan.  She is agreeable. Daleen Bo   Medical Decision Making:  This patient is presenting for evaluation of choking followed by respiratory distress, which does require a range of treatment options, and is a complaint that involves a high risk of morbidity and mortality. The differential diagnoses include tracheal or bronchial obstruction, pneumothorax. I decided to  review old records, and in summary elderly male with dementia, presenting after choking on chicken..  I obtained additional historical information from patient's wife.  Clinical Laboratory Tests Ordered, included CBC, Metabolic panel, and ABG, viral panel . Review indicates normal except PCO2 elevated on arterial blood gas, glucose high, BUN high. Radiologic Tests Ordered, included chest x-ray pre and postintubation.  I independently Visualized: Radiograph images, which show per my interpretation left-sided pneumothorax.  ET tube above carina on second image.     Critical Interventions-clinical evaluation, intubation for respiratory distress, laboratory testing, bronchoscopy, proximal esophagoscopy, empiric antibiotics for aspiration  After These Interventions, the Patient was reevaluated and was found to require hospitalization for further evaluation and treatment.  It appears that this patient aspirated causing respiratory distress.  At this point it is felt that the aspiration was primarily fluid, not solid foreign body.  Patient has an intrathoracic stomach because of hiatal hernia.  His esophagus is fluid-filled and he had food material in the  proximal esophagus which was removed, using suction at the bedside.  Airway is secured.  Patient does not have prior history of significant swallowing problems despite having an intrathoracic stomach from hiatal hernia.  The patient needs formal endoscopy, for assessment of distal foreign body, and her complications from hiatal hernia.  The patient was treated emergently with endotracheal intubation because of the information available at the time indicated that he probably had a foreign body obstruction in his airway.  Patient's wife asserts that he is essentially DNR but has not had that formally declared.  I think it is reasonable to proceed with endoscopy for further assessment and hopefully clearing the esophagus for resumption of eating.  CRITICAL  CARE-yes Performed by: Daleen Bo  Nursing Notes Reviewed/ Care Coordinated Applicable Imaging Reviewed Interpretation of Laboratory Data incorporated into ED treatment  6:21 PM-Consult complete with hospitalist. Patient case explained and discussed.  He agrees to admit patient for further evaluation and treatment. Call ended at 6:40 PM    Final Clinical Impression(s) / ED Diagnoses Final diagnoses:  Aspiration pneumonia due to gastric secretions, unspecified laterality, unspecified part of lung ([place])  Acute respiratory failure, unspecified whether with hypoxia or hypercapnia ([place])  Foreign body in esophagus, initial encounter    Rx / DC Orders ED Discharge Orders     None        Daleen Bo, MD 11/19/20 2115

## 2020-11-19 NOTE — ED Notes (Signed)
Pt intubated at 4pm . Etonidate 20 given at 356pm, succs 100 given at 357hrs.

## 2020-11-19 NOTE — Op Note (Signed)
[facility] Patient Name: [name] Procedure Date: 11/19/2020 9:53 PM MRN: [id number] Date of Birth: 1932/06/18 Attending MD: Hildred Laser , MD CSN: [id number] Age: 87 Admit Type: Outpatient Procedure:                Upper GI endoscopy Indications:              Foreign body in the esophagus Providers:                Hildred Laser, MD, Lurline Del, RN, Kristine L.                            Risa Grill, Technician Referring MD:              Medicines:                Patient is intubated and on propofol infusion.                           Procedure was performed at bedside in ICU. Complications:            No immediate complications. Estimated Blood Loss:     Estimated blood loss: none. Procedure:                Pre-Anesthesia Assessment:                           - Prior to the procedure, a History and Physical                            was performed, and patient medications and                            allergies were reviewed. The patient's tolerance of                            previous anesthesia was also reviewed. The risks                            and benefits of the procedure and the sedation                            options and risks were discussed with the patient.                            All questions were answered, and informed consent                            was obtained. Prior Anticoagulants: The patient                            last took Xarelto (rivaroxaban) 1 day prior to the                            procedure. ASA Grade Assessment: III - A patient  with severe systemic disease. After reviewing the                            risks and benefits, the patient was deemed in                            satisfactory condition to undergo the procedure.                           After obtaining informed consent, the endoscope was                            passed under direct vision. Throughout the                             procedure, the patient's blood pressure, pulse, and                            oxygen saturations were monitored continuously. The                            GIF-H190 ([id number]) scope was introduced through the                            mouth, and advanced to the second part of duodenum.                            The upper GI endoscopy was accomplished without                            difficulty. The patient tolerated the procedure                            well. Scope In: 10:07:02 PM Scope Out: 10:35:16 PM Total Procedure Duration: 0 hours 28 minutes 14 seconds  Findings:      The proximal esophagus was normal.      Food was found in the mid esophagus and in the distal esophagus. Removal       of food was accomplished using polypectomy snare. At least 12 passes       were made. Multiple pieces of foreign body i.e. chicken were removed.      LA Grade A (one or more mucosal breaks less than 5 mm, not extending       between tops of 2 mucosal folds) esophagitis with no bleeding was found       35 to 38 cm from the incisors.      One benign-appearing, intrinsic mild stenosis was found 37 to 38 cm from       the incisors. The stenosis was traversed.      A large hiatal hernia was found.      Localized mildly erythematous edematous mucosa without bleeding was       found at the pylorus.      The duodenal bulb and second portion of the duodenum were normal. Impression:               -  Normal proximal esophagus.                           - Food in the mid esophagus and in the distal                            esophagus. Removal was successful.                           - LA Grade A reflux esophagitis with no bleeding.                           - Benign-appearing esophageal stenosis.                           - Large hiatal hernia with organoaxial rotation of                            the stomach.                           - Erythematous, edematous mucosa in the pylorus.                            - Normal duodenal bulb and second portion of the                            duodenum. Moderate Sedation:      Patient on propofol drip. No additional medication used for this       procedure. Recommendation:           - Continue present medications.                           - Repeat upper endoscopy in 2 days with possible                            esophageal dilation.                           - No contraindication using heparin but will need                            to hold Xarelto for now. Procedure Code(s):        --- Professional ---                           [phone], Esophagogastroduodenoscopy, flexible,                            transoral; with removal of foreign body(s) Diagnosis Code(s):        --- Professional ---                           T01.601U, Food in esophagus causing other injury,  initial encounter                           K21.00, Gastro-esophageal reflux disease with                            esophagitis, without bleeding                           K22.2, Esophageal obstruction                           K44.9, Diaphragmatic hernia without obstruction or                            gangrene                           K31.89, Other diseases of stomach and duodenum                           T18.108A, Unspecified foreign body in esophagus                            causing other injury, initial encounter CPT copyright 2019 [employer]. All rights reserved. The codes documented in this report are preliminary and upon coder review may  be revised to meet current compliance requirements. Hildred Laser, MD Hildred Laser, MD 11/19/2020 10:55:19 PM This report has been signed electronically. Number of Addenda: 0

## 2020-11-19 NOTE — ED Notes (Signed)
Dr at bedside when pt arrived.

## 2020-11-19 NOTE — Consult Note (Signed)
Referring Provider: Daleen Bo, MD  Primary Care Physician:  [name], MD Primary Gastroenterologist:  Dr. Silverio Decamp  Reason for Consultation:    Esophageal obstruction.  HPI:   Patient has been intubated.  History obtained from patient's wife Darrell [name] and Dr. Daleen Bo, MD ER physician who evaluated and treated patient on arrival.  Patient is 87 year old Caucasian [name] with multiple medical problems who has known large hiatal hernia erosive reflux esophagitis and esophageal stricture maintained on Xarelto/rivaroxaban(last dose at 5 PM yesterday according to his wife) was eating chicken from [facility] and choked.  He was having breathing difficulty.  Patient was brought to emergency room by EMS.  Reportedly he was also somewhat obtunded.  On arrival to emergency room he was noted to have O2 sat in 70s.  He was felt to be in respiratory distress and he had absent breath sounds over left lung.  He was therefore intubated.  Dr.Wentz did remove some pieces of chicken from hypopharynx and upper esophagus.  However examination of the esophagus was not complete.  Chest x-ray obtained after he was intubated revealed enlarged cardiac silhouette and possible left pleural effusion.  Chest CT revealed large hiatal hernia with entire stomach and thoracic cavity.  There was no pneumothorax or pneumomediastinum.  Cardiomegaly with evidence of prior CABG prominent dilation to central pulmonary arteries consistent with pulmonary hypertension and chronic volume loss in left lower lobe with occlusion of left lower lobe bronchus.  He also had chronic left basilar atelectasis and emphysema.  NG tube was placed and was felt that this tube was possibly in distal esophagus. GI was consulted for endoscopy for foreign body removal. According to his wife he has not been taking pantoprazole daily.  He has been having swallowing difficulty for a while.  He does not use dentures. No history of hematemesis or  melena.  He had EGD/enteroscopy by Dr. Deatra Ina from melena back in December 2013 and revealed distal esophageal stricture large hiatal hernia and erosive gastritis. Patient was last seen by Dr. Silverio Decamp in June 2021 for constipation rectal bleeding but declined to undergo colonoscopy.  His H&H today is normal. Patient was felt to have aspiration pneumonia and begun on ceftriaxone and metronidazole.  Past Medical History:  Diagnosis Date   Anemia    ASD (atrial septal defect)    a. s/p repair 1983.   BPH (benign prostatic hyperplasia)    Cerebrovascular accident ([place]) 1990   Chronic bronchitis ([place])    a. h/o resp failure in setting of bronchitis vs PNA 05/2012.   Chronic diarrhea    COPD (chronic obstructive pulmonary disease) (HCC)    Coronary artery disease    a. Nonobstructive by cath 2009 and 09/06/14   Depression    Dysphagia, unspecified(787.20)    Erosive gastritis    H/o GIB felt secondary to this   Esophagitis    Falls frequently    Gastric polyp    Gastric ulcer    GERD (gastroesophageal reflux disease)    Hearing impairment    Hiatal hernia    History of upper gastrointestinal hemorrhage    dr. Maurene Capes, GI   Hyperlipidemia    Hypertension    Internal hemorrhoids    LV dysfunction    a. EF 45-50% by echo 05/2012.   Permanent atrial fibrillation (HCC)    INR followed at [facility].   Vision problems     Past Surgical History:  Procedure Laterality Date   ASD [place]  possibly 3 times, dr. brody/cooper   CHOLECYSTECTOMY  1990s   ENTEROSCOPY  05/13/2012   Procedure: ENTEROSCOPY;  Surgeon: Inda Castle, MD;  Location: [facility] ENDOSCOPY;  Service: Endoscopy;  Laterality: N/A;   INGUINAL HERNIA REPAIR  1963   LEFT HEART CATHETERIZATION WITH CORONARY ANGIOGRAM N/A 09/06/2014   Procedure: LEFT HEART CATHETERIZATION WITH CORONARY ANGIOGRAM;  Surgeon: Jettie Booze, MD;  Location: [facility] CATH LAB;  Service: Cardiovascular;  Laterality: N/A;   partial  small bowel resection  2005   ischemic?    Prior to Admission medications   Medication Sig Start Date End Date Taking? Authorizing Provider  buPROPion (WELLBUTRIN XL) 150 MG 24 hr tablet TAKE 1 TABLET EVERY DAY Patient taking differently: Take 150 mg by mouth daily. 10/05/20  Yes Burchette, Alinda Sierras, MD  diltiazem (CARDIZEM CD) 180 MG 24 hr capsule TAKE 1 CAPSULE ONCE DAILY Patient taking differently: Take 180 mg by mouth daily. 10/27/20  Yes Burchette, Alinda Sierras, MD  dutasteride (AVODART) 0.5 MG capsule TAKE (1) CAPSULE DAILY Patient taking differently: Take 0.5 mg by mouth daily. TAKE (1) CAPSULE DAILY 11/09/20  Yes Burchette, Alinda Sierras, MD  furosemide (LASIX) 20 MG tablet Take 1 tablet (20 mg total) by mouth as needed. For edema 11/17/19  Yes Burchette, Alinda Sierras, MD  ipratropium (ATROVENT) 0.02 % nebulizer solution Nebulize one vial every 6 hours as needed. Patient taking differently: Take 0.5 mg by nebulization every 6 (six) hours as needed for wheezing or shortness of breath. Nebulize one vial every 6 hours as needed. 08/02/20  Yes Burchette, Alinda Sierras, MD  isosorbide mononitrate (IMDUR) 30 MG 24 hr tablet TAKE 1 TABLET DAILY Patient taking differently: Take 30 mg by mouth daily. 11/09/20  Yes Burchette, Alinda Sierras, MD  levalbuterol (XOPENEX) 0.63 MG/3ML nebulizer solution NEBULIZE 1 VIAL (WITH IPRATROPIUM) TWICE A DAY AS NEEDED. MAY USE EXTR A EVERY 4 HOURS IF NEEDED Patient taking differently: Take 0.63 mg by nebulization 2 (two) times daily as needed for wheezing or shortness of breath. NEBULIZE 1 VIAL (WITH IPRATROPIUM) TWICE A DAY AS NEEDED. MAY USE EXTR A EVERY 4 HOURS IF NEEDED 07/10/20  Yes Burchette, Alinda Sierras, MD  mirtazapine (REMERON) 15 MG tablet TAKE 1 TABLET DAILY Patient taking differently: Take 15 mg by mouth at bedtime. 09/20/20  Yes Burchette, Alinda Sierras, MD  nitroGLYCERIN (NITROSTAT) 0.4 MG SL tablet Place 1 tablet (0.4 mg total) under the tongue every 5 (five) minutes x 3 doses as needed for chest  pain. 08/20/14  Yes Theora Gianotti, NP  pantoprazole (PROTONIX) 40 MG tablet Take 1 tablet (40 mg total) by mouth daily. 11/11/19  Yes Esterwood, Amy S, PA-C  potassium chloride (KLOR-CON 10) 10 MEQ tablet Take two tablets once daily on days taking Furosemide. Patient taking differently: Take 20 mEq by mouth daily as needed (supplement with Furosemide). Take two tablets once daily on days taking Furosemide. 11/17/19  Yes Burchette, Alinda Sierras, MD  saccharomyces boulardii (FLORASTOR) 250 MG capsule Take 250 mg by mouth 2 (two) times daily.   Yes [provider]  simvastatin (ZOCOR) 20 MG tablet TAKE 1 TABLET AT BEDTIME Patient taking differently: Take 20 mg by mouth at bedtime. 10/31/20  Yes Burchette, Alinda Sierras, MD  vitamin B-12 (CYANOCOBALAMIN) 500 MCG tablet Take 500 mcg by mouth daily.   Yes [provider]  XARELTO 20 MG TABS tablet TAKE 1 TABLET DAILY WITH SUPPER Patient taking differently: Take 20 mg by mouth daily with supper. 09/04/20  Yes Burchette, Alinda Sierras, MD  clotrimazole-betamethasone (LOTRISONE) cream APPLY TO AFFECTED AREAS TWICE A DAY Patient not taking: No sig reported 06/26/16   [name], MD    Current Facility-Administered Medications  Medication Dose Route Frequency Provider Last Rate Last Admin   cefTRIAXone (ROCEPHIN) 2 g in sodium chloride 0.9 % 100 mL IVPB  2 g Intravenous Q24H Daleen Bo, MD 200 mL/hr at 11/19/20 2022 2 g at 11/19/20 2022   metroNIDAZOLE (FLAGYL) IVPB 500 mg  500 mg Intravenous Q8H Daleen Bo, MD       pantoprazole (PROTONIX) injection 40 mg  40 mg Intravenous Q12H Adefeso, Oladapo, DO       propofol (DIPRIVAN) 1000 MG/100ML infusion  5-80 mcg/kg/min Intravenous Continuous Daleen Bo, MD 23.5 mL/hr at 11/19/20 1732 65 mcg/kg/min at 11/19/20 1732   Current Outpatient Medications  Medication Sig Dispense Refill   buPROPion (WELLBUTRIN XL) 150 MG 24 hr tablet TAKE 1 TABLET EVERY DAY (Patient taking differently: Take  150 mg by mouth daily.) 30 tablet 6   diltiazem (CARDIZEM CD) 180 MG 24 hr capsule TAKE 1 CAPSULE ONCE DAILY (Patient taking differently: Take 180 mg by mouth daily.) 90 capsule 0   dutasteride (AVODART) 0.5 MG capsule TAKE (1) CAPSULE DAILY (Patient taking differently: Take 0.5 mg by mouth daily. TAKE (1) CAPSULE DAILY) 90 capsule 0   furosemide (LASIX) 20 MG tablet Take 1 tablet (20 mg total) by mouth as needed. For edema 30 tablet 6   ipratropium (ATROVENT) 0.02 % nebulizer solution Nebulize one vial every 6 hours as needed. (Patient taking differently: Take 0.5 mg by nebulization every 6 (six) hours as needed for wheezing or shortness of breath. Nebulize one vial every 6 hours as needed.) 312.5 mL 3   isosorbide mononitrate (IMDUR) 30 MG 24 hr tablet TAKE 1 TABLET DAILY (Patient taking differently: Take 30 mg by mouth daily.) 90 tablet 0   levalbuterol (XOPENEX) 0.63 MG/3ML nebulizer solution NEBULIZE 1 VIAL (WITH IPRATROPIUM) TWICE A DAY AS NEEDED. MAY USE EXTR A EVERY 4 HOURS IF NEEDED (Patient taking differently: Take 0.63 mg by nebulization 2 (two) times daily as needed for wheezing or shortness of breath. NEBULIZE 1 VIAL (WITH IPRATROPIUM) TWICE A DAY AS NEEDED. MAY USE EXTR A EVERY 4 HOURS IF NEEDED) 225 mL 0   mirtazapine (REMERON) 15 MG tablet TAKE 1 TABLET DAILY (Patient taking differently: Take 15 mg by mouth at bedtime.) 90 tablet 0   nitroGLYCERIN (NITROSTAT) 0.4 MG SL tablet Place 1 tablet (0.4 mg total) under the tongue every 5 (five) minutes x 3 doses as needed for chest pain. 25 tablet 3   pantoprazole (PROTONIX) 40 MG tablet Take 1 tablet (40 mg total) by mouth daily. 30 tablet 11   potassium chloride (KLOR-CON 10) 10 MEQ tablet Take two tablets once daily on days taking Furosemide. (Patient taking differently: Take 20 mEq by mouth daily as needed (supplement with Furosemide). Take two tablets once daily on days taking Furosemide.) 60 tablet 3   saccharomyces boulardii (FLORASTOR) 250  MG capsule Take 250 mg by mouth 2 (two) times daily.     simvastatin (ZOCOR) 20 MG tablet TAKE 1 TABLET AT BEDTIME (Patient taking differently: Take 20 mg by mouth at bedtime.) 90 tablet 0   vitamin B-12 (CYANOCOBALAMIN) 500 MCG tablet Take 500 mcg by mouth daily.     XARELTO 20 MG TABS tablet TAKE 1 TABLET DAILY WITH SUPPER (Patient taking differently: Take 20 mg by mouth daily with supper.)  90 tablet 0   clotrimazole-betamethasone (LOTRISONE) cream APPLY TO AFFECTED AREAS TWICE A DAY (Patient not taking: No sig reported) 45 g 0    Allergies as of 11/19/2020 - Review Complete 11/19/2020  Allergen Reaction Noted   Lovenox [enoxaparin sodium] Swelling 02/02/2016   Penicillins Hives    Tape Other (See Comments) 01/11/2016    Family History  Problem Relation Age of Onset   Heart disease Mother    Heart disease Father    Prostate cancer Brother     Social History   Socioeconomic History   Marital status: Married    Spouse name: Office manager   Number of children: 1   Years of education: Not on file   Highest education level: Not on file  Occupational History   Not on file  Tobacco Use   Smoking status: Former    Packs/day: 0.00    Years: 0.00    Pack years: 0.00    Types: Cigarettes    Quit date: 06/13/1983    Years since quitting: 37.4   Smokeless tobacco: Never  Vaping Use   Vaping Use: Never used  Substance and Sexual Activity   Alcohol use: No   Drug use: No   Sexual activity: Not on file  Other Topics Concern   Not on file  Social History Narrative   Lives in [place] with wife.  Several falls recently, 4 in the last month.        Darrell [name]  [phone]               Social Determinants of Health   Financial Resource Strain: Not on file  Food Insecurity: Not on file  Transportation Needs: Not on file  Physical Activity: Not on file  Stress: Not on file  Social Connections: Not on file  Intimate Partner Violence: Not on file    Review of Systems: See  HPI, otherwise normal ROS  Physical Exam: Temp:  [95.72 F (35.4 C)-97.7 F (36.5 C)] 95.72 F (35.4 C) (06/19 2000) Pulse Rate:  [62-112] 66 (06/19 2000) Resp:  [15-30] 18 (06/19 2000) BP: (104-211)/(61-135) 126/95 (06/19 2000) SpO2:  [86 %-100 %] 100 % (06/19 2000) FiO2 (%):  [70 %-100 %] 70 % (06/19 1951) Weight:  [60.3 kg] 60.3 kg (06/19 1608)   Patient is intubated.  He has orotracheal tube in place. He is sedated. Conjunctivae is pink.  Sclera is nonicteric. Oropharyngeal mucosa is unremarkable.  He is edentulous. Neck without masses thyromegaly or lymphadenopathy. Midsternal scar noted. Cardiac exam with irregular rhythm normal S1 and S2.  No murmur or gallop noted. Auscultation lungs reveal diminished intensity of breath sounds lower left lower lung. Breath sounds are normal over right lung. His abdomen is flat.  He has midline scar extending above and below the umbilicus.  His abdomen is soft and nontender with organomegaly or masses. He has few small ecchymosis over right arm there is no peripheral edema or clubbing.    Lab Results: Recent Labs    11/19/20 1546  WBC 8.3  HGB 13.2  HCT Darrell.0  PLT 214   BMET Recent Labs    11/19/20 1546  NA 140  K 3.9  CL 103  CO2 29  GLUCOSE 148*  BUN 28*  CREATININE 0.78  CALCIUM 9.0   LFT Recent Labs    11/19/20 1546  PROT 7.6  ALBUMIN 4.4  AST 23  ALT 14  ALKPHOS 74  BILITOT 0.3   PT/INR No results for input(s): LABPROT,  INR in the last 72 hours. Hepatitis Panel No results for input(s): HEPBSAG, HCVAB, HEPAIGM, HEPBIGM in the last 72 hours.  Studies/Results: CT Chest Wo Contrast  Result Date: 11/19/2020 CLINICAL DATA:  Provided history of pneumothorax. Patient choked on a piece of chicken. EXAM: CT CHEST WITHOUT CONTRAST TECHNIQUE: Multidetector CT imaging of the chest was performed following the standard protocol without IV contrast. COMPARISON:  Radiograph earlier today.  Chest CT 10 17 FINDINGS:  Cardiovascular: Aortic atherosclerosis and tortuosity. Post median sternotomy and CABG. Prominent dilatation of the central pulmonary arteries with main pulmonary artery measuring 4 cm, right pulmonary artery measuring 4.2 cm and left pulmonary artery measuring 3.5 cm. Dense coronary artery calcification of native coronary arteries. Multi chamber cardiomegaly. No pericardial effusion. Mediastinum/Nodes: Large hiatal hernia with the entire stomach being intrathoracic. There is a granular axial a rotation of the stomach. The esophagus is dilated to the level of the thoracic inlet. Upper esophagus is patulous. No evidence of pneumomediastinum. Endotracheal tube tip above the carina. No enlarged mediastinal lymph nodes. Limited hilar assessment. No thyroid nodule. Lungs/Pleura: No pneumothorax. Chronic volume loss in the left lower lobe with occlusion of the left lower lobe bronchus, likely compressive. Chronic left basilar atelectasis. Minimal scarring in the anterior and lateral right upper lobe, similar to prior exam. Emphysema. Minimal right lower lobe bronchiectasis. No acute confluent airspace disease. No pulmonary mass. No pleural fluid. Upper Abdomen: Large hiatal hernia with the entire stomach being intrathoracic. Cholecystectomy. No acute upper abdominal findings. Musculoskeletal: Exaggerated thoracic kyphosis with bony under mineralization. Chronic L2 compression fracture is unchanged from 11/18/2019 abdominal CT. Median sternotomy. No acute osseous abnormalities are seen. IMPRESSION: 1. Large hiatal hernia with the entire stomach being intrathoracic. The esophagus is fluid-filled and dilated to the level of the thoracic inlet. No pneumomediastinum. 2. No pneumothorax. 3. Cardiomegaly post CABG. Prominent dilatation of the central pulmonary arteries consistent with pulmonary arterial hypertension. 4. Chronic volume loss in the left lower lobe with occlusion of the left lower lobe bronchus, likely compressive.  Chronic left basilar atelectasis. 5. Emphysema. Aortic Atherosclerosis (ICD10-I70.0) and Emphysema (ICD10-J43.9). Electronically Signed   By: Keith Rake M.D.   On: 11/19/2020 17:06   DG Chest Port 1 View  Result Date: 11/19/2020 CLINICAL DATA:  OG tube placement EXAM: PORTABLE CHEST 1 VIEW COMPARISON:  11/19/2020 FINDINGS: Endotracheal tube terminates at the level of the clavicular heads. Interval placement of enteric tube with side port terminating at the level of the carina and distal tip terminating within the lower chest. Large hiatal hernia. Emphysematous changes of the lung fields. Left basilar opacities, better seen on recent CT. No pneumothorax. IMPRESSION: Interval placement of enteric tube. The distal tip is likely positioned within patient's large hiatal hernia. The side port is likely within the distal esophagus. Recommend advancement. Electronically Signed   By: Davina Poke D.O.   On: 11/19/2020 18:56   DG Chest Portable 1 View  Result Date: 11/19/2020 CLINICAL DATA:  Choking post intubation. EXAM: PORTABLE CHEST 1 VIEW COMPARISON:  December 31, 2017 FINDINGS: Endotracheal tube terminates 4 cm above the carina, appropriately position. Enlarged cardiac silhouette. No evidence of pneumothorax. Possible left pleural effusion. Osseous structures are without acute abnormality. Soft tissues are grossly normal. IMPRESSION: 1. Endotracheal tube terminates 4 cm above the carina, appropriately positioned. 2. Enlarged cardiac silhouette. 3. Possible left pleural effusion. Electronically Signed   By: Fidela Salisbury M.D.   On: 11/19/2020 16:13    I have reviewed imaging studies.  Assessment;  Patient is 87 year old Caucasian [name] with multiple medical problems who is anticoagulated for history of for atrial fibrillation and history of pulmonary embolism who has chronic GERD distal esophageal stricture and known large hiatal hernia with intrathoracic stomach who developed esophageal food  impaction while eating chicken at supper and experienced respiratory distress.  He was also felt to have aspiration pneumonia.  Patient's respiratory status has stabilized with intubation.  He is on ceftriaxone and metronidazole. Patient would benefit from esophagogastroduodenoscopy with foreign body removal.  Since he took Xarelto last evening esophageal dilation would not be attempted. I have reviewed the procedure risk with the patient and she is agreeable and provided with consent over the phone. Patient is on pantoprazole 40 mg IV every 12 hours.  Recommendations;  Esophagogastroduodenoscopy at bedside with foreign body removal. Esophageal dilation if needed can be performed before he is extubated.   LOS: 0 days   Chalise Pe  11/19/2020, 9:11 PM   Up with daughter in

## 2020-11-19 NOTE — Progress Notes (Signed)
Brief EGD note.  Multiple pieces of chicken in mid and distal esophagus. All of these pieces were removed using polypectomy snare. At least 12 passes were made and all pieces were removed. Grade a esophagitis involving distal esophagus would soft stricture at GE junction Large hiatal hernia with organoaxial rotation of the stomach. No evidence of peptic ulcer disease. Normal D1 and D2. Patient tolerated the procedure well.

## 2020-11-19 NOTE — H&P (Signed)
History and Physical  [name] UXN:[id number] DOB: 06/10/1932 DOA: 11/19/2020  Referring physician: Daleen Bo, MD PCP: [name], MD  Patient coming from: Home  Chief Complaint: Choking   HPI: [name] is an 87 y.o. male with medical history significant for [place]ria fib on Xarelto, HTN, COPD chronic respiratory failure, dementia and frequent falls who presents to the emergency department via EMS after choking on chicken at home.  Patient was unable to provide history due to being intubated and sedated, history was obtained from ED physician and ED medical record.  Per report, patient was eating chicken when he suddenly started having some gurgling sound, wife checked on him, patient was able to spit up some chicken, but she suspected that patient was choking, so she activated EMS and patient was taken to the ED for further evaluation and management.  There was no report of fever, chills, nausea or vomiting.  ED Course:  In the emergency department, patient was noted to be hypoxic with an O2 sat of 73% on room air, this increased to 88% with facemask oxygen, he was tachycardic and tachypneic and appeared to be in respiratory distress and decision was made for patient to be intubated so as to protect airway in the setting of hypoxia.  Work-up in the ED showed normal CBC and BMP except for BUN at 28 and hyperglycemia.  ABG showed hypercapnia.  Influenza A, B, SARS coronavirus 2 was negative. Chest x-ray showed i area silhouette and possible left pleural effusion with endotracheal tube terminating 4 cm above the carina. CT chest without contrast showed large hiatal hernia with the entire stomach being intrathoracic. The esophagus is fluid-filled and dilated to the level of the thoracic inlet. No pneumomediastinum. No pneumothorax. CT of head showed no acute intracranial normality Bedside bronchoscopy by ED physician was done and no bronchial obstruction was noted.  A piece of  chicken was aspirated in the distal esophagus at bedside by ED physician.  Gastroenterology was consulted due to hiatal hernia in thoracic cavity and will come to ED for bedside EGD per ED physician.  Patient was empirically treated with IV ceftriaxone and metronidazole.  Intubated patient was sedated with propofol.   Review of Systems: This cannot be obtained at this time due to patient being intubated and sedated  Past Medical History:  Diagnosis Date   Anemia    ASD (atrial septal defect)    a. s/p repair 1983.   BPH (benign prostatic hyperplasia)    Cerebrovascular accident ([place]) 1990   Chronic bronchitis ([place])    a. h/o resp failure in setting of bronchitis vs PNA 05/2012.   Chronic diarrhea    COPD (chronic obstructive pulmonary disease) (HCC)    Coronary artery disease    a. Nonobstructive by cath 2009 and 09/06/14   Depression    Dysphagia, unspecified(787.20)    Erosive gastritis    H/o GIB felt secondary to this   Esophagitis    Falls frequently    Gastric polyp    Gastric ulcer    GERD (gastroesophageal reflux disease)    Hearing impairment    Hiatal hernia    History of upper gastrointestinal hemorrhage    dr. Maurene Capes, GI   Hyperlipidemia    Hypertension    Internal hemorrhoids    LV dysfunction    a. EF 45-50% by echo 05/2012.   Permanent atrial fibrillation (HCC)    [facility]R [facility] at [facility].   Vision problems    Past  Surgical History:  Procedure Laterality Date   ASD REPAIR  1983   CARDIOVERSION     possibly 3 times, dr. brody/cooper   CHOLECYSTECTOMY  1990s   ENTEROSCOPY  05/13/2012   Procedure: ENTEROSCOPY;  Surgeon: Inda Castle, MD;  Location: [facility] ENDOSCOPY;  Service: Endoscopy;  Laterality: N/A;   INGUINAL HERNIA REPAIR  1963   LEFT HEART CATHETERIZATION WITH CORONARY ANGIOGRAM N/A 09/06/2014   Procedure: LEFT HEART CATHETERIZATION WITH CORONARY ANGIOGRAM;  Surgeon: Jettie Booze, MD;  Location: [facility] CATH LAB;  Service: Cardiovascular;  Laterality:  N/A;   partial small bowel resection  2005   ischemic?    Social History:  reports that he quit smoking about 37 years ago. His smoking use included cigarettes. He has never used smokeless tobacco. He reports that he does not drink alcohol and does not use drugs.   Allergies  Allergen Reactions   Lovenox [Enoxaparin Sodium] Swelling   Tape Other (See Comments)    SKIN WILL TEAR IF TAPED (MUST BE LIGHT & BRIEF OR USE COBAN WRAP, PLEASE!!)   Penicillins Hives    Has patient had a PCN reaction causing immediate rash, facial/tongue/throat swelling, SOB or lightheadedness with hypotension: Yes Has patient had a PCN reaction causing severe rash involving mucus membranes or skin necrosis: No Has patient had a PCN reaction that required hospitalization: Unknown Has patient had a PCN reaction occurring within the last 10 years: No If all of the above answers are "NO", then may proceed with Cephalosporin use.     Family History  Problem Relation Age of Onset   Heart disease Mother    Heart disease Father    Prostate cancer Brother      Prior to Admission medications   Medication Sig Start Date End Date Taking? Authorizing Provider  buPROPion (WELLBUTRIN XL) 150 MG 24 hr tablet TAKE 1 TABLET EVERY DAY 10/05/20   Burchette, Alinda Sierras, MD  clotrimazole-betamethasone (LOTRISONE) cream APPLY TO AFFECTED AREAS TWICE A DAY Patient not taking: Reported on 11/11/2019 06/26/16   [name], MD  diltiazem (CARDIZEM CD) 180 MG 24 hr capsule TAKE 1 CAPSULE ONCE DAILY 10/27/20   Burchette, Alinda Sierras, MD  dutasteride (AVODART) 0.5 MG capsule TAKE (1) CAPSULE DAILY 11/09/20   Burchette, Alinda Sierras, MD  furosemide (LASIX) 20 MG tablet Take 1 tablet (20 mg total) by mouth as needed. For edema 11/17/19   [name], MD  ipratropium (ATROVENT) 0.02 % nebulizer solution Nebulize one vial every 6 hours as needed. 08/02/20   Burchette, Alinda Sierras, MD  isosorbide mononitrate (IMDUR) 30 MG 24 hr tablet TAKE 1 TABLET  DAILY 11/09/20   Burchette, Alinda Sierras, MD  levalbuterol (XOPENEX) 0.63 MG/3ML nebulizer solution NEBULIZE 1 VIAL (WITH IPRATROPIUM) TWICE A DAY AS NEEDED. MAY USE EXTR A EVERY 4 HOURS IF NEEDED 07/10/20   Burchette, Alinda Sierras, MD  mirtazapine (REMERON) 15 MG tablet TAKE 1 TABLET DAILY 09/20/20   Burchette, Alinda Sierras, MD  mupirocin ointment (BACTROBAN) 2 % Apply 1 application topically 2 (two) times daily. Patient not taking: Reported on 11/11/2019    [provider]  nitroGLYCERIN (NITROSTAT) 0.4 MG SL tablet Place 1 tablet (0.4 mg total) under the tongue every 5 (five) minutes x 3 doses as needed for chest pain. 08/20/14   Theora Gianotti, NP  pantoprazole (PROTONIX) 40 MG tablet Take 1 tablet (40 mg total) by mouth daily. 11/11/19   Esterwood, Amy S, PA-C  potassium chloride (KLOR-CON 10) 10  MEQ tablet Take two tablets once daily on days taking Furosemide. 11/17/19   Burchette, Alinda Sierras, MD  saccharomyces boulardii (FLORASTOR) 250 MG capsule Take 250 mg by mouth 2 (two) times daily.    [provider]  simvastatin (ZOCOR) 20 MG tablet TAKE 1 TABLET AT BEDTIME 10/31/20   Burchette, Alinda Sierras, MD  vitamin B-12 (CYANOCOBALAMIN) 500 MCG tablet Take 500 mcg by mouth daily.    [provider]  XARELTO 20 MG TABS tablet TAKE 1 TABLET DAILY WITH SUPPER 09/04/20   [name], MD    Physical Exam: BP (!) 126/95   Pulse 66   Temp (!) 95.72 F (35.4 C)   Resp 18   Ht 5\' 11"  (1.803 m)   Wt 60.3 kg   SpO2 100%   BMI 18.54 kg/m   General: 87 y.o. year-old male well developed well nourished in no acute distress.  Intubated and sedated HEENT: NCAT, EOMI Neck: Supple, trachea medial Cardiovascular: Regular rate and rhythm with no rubs or gallops.  No thyromegaly or JVD noted.  No lower extremity edema. 2/4 pulses in all 4 extremities. Respiratory: Diminished breath sounds in left lobes.  No wheezes or rales.  Abdomen: Soft, nontender nondistended with normal bowel sounds x4  quadrants. Muskuloskeletal: No cyanosis, clubbing or edema noted bilaterally Neuro: Patient occasionally moves lower extremities.  Sensation intact Skin: No ulcerative lesions noted or rashes Psychiatry: Mood is appropriate for condition and setting          Labs on Admission:  Basic Metabolic Panel: Recent Labs  Lab 11/19/20 1546  NA 140  K 3.9  CL 103  CO2 29  GLUCOSE 148*  BUN 28*  CREATININE 0.78  CALCIUM 9.0   Liver Function Tests: Recent Labs  Lab 11/19/20 1546  AST 23  ALT 14  ALKPHOS 74  BILITOT 0.3  PROT 7.6  ALBUMIN 4.4   No results for input(s): LIPASE, AMYLASE in the last 168 hours. No results for input(s): AMMONIA in the last 168 hours. CBC: Recent Labs  Lab 11/19/20 1546  WBC 8.3  NEUTROABS 5.3  HGB 13.2  HCT 44.0  MCV 88.4  PLT 214   Cardiac Enzymes: No results for input(s): CKTOTAL, CKMB, CKMBINDEX, TROPONINI in the last 168 hours.  BNP (last 3 results) No results for input(s): BNP in the last 8760 hours.  ProBNP (last 3 results) No results for input(s): PROBNP in the last 8760 hours.  CBG: No results for input(s): GLUCAP in the last 168 hours.  Radiological Exams on Admission: CT Chest Wo Contrast  Result Date: 11/19/2020 CLINICAL DATA:  Provided history of pneumothorax. Patient choked on a piece of chicken. EXAM: CT CHEST WITHOUT CONTRAST TECHNIQUE: Multidetector CT imaging of the chest was performed following the standard protocol without IV contrast. COMPARISON:  Radiograph earlier today.  Chest CT 10 17 FINDINGS: Cardiovascular: Aortic atherosclerosis and tortuosity. Post median sternotomy and CABG. Prominent dilatation of the central pulmonary arteries with main pulmonary artery measuring 4 cm, right pulmonary artery measuring 4.2 cm and left pulmonary artery measuring 3.5 cm. Dense coronary artery calcification of native coronary arteries. Multi chamber cardiomegaly. No pericardial effusion. Mediastinum/Nodes: Large hiatal hernia  with the entire stomach being intrathoracic. There is a granular axial a rotation of the stomach. The esophagus is dilated to the level of the thoracic inlet. Upper esophagus is patulous. No evidence of pneumomediastinum. Endotracheal tube tip above the carina. No enlarged mediastinal lymph nodes. Limited hilar assessment. No thyroid nodule. Lungs/Pleura:  No pneumothorax. Chronic volume loss in the left lower lobe with occlusion of the left lower lobe bronchus, likely compressive. Chronic left basilar atelectasis. Minimal scarring in the anterior and lateral right upper lobe, similar to prior exam. Emphysema. Minimal right lower lobe bronchiectasis. No acute confluent airspace disease. No pulmonary mass. No pleural fluid. Upper Abdomen: Large hiatal hernia with the entire stomach being intrathoracic. Cholecystectomy. No acute upper abdominal findings. Musculoskeletal: Exaggerated thoracic kyphosis with bony under mineralization. Chronic L2 compression fracture is unchanged from 11/18/2019 abdominal CT. Median sternotomy. No acute osseous abnormalities are seen. IMPRESSION: 1. Large hiatal hernia with the entire stomach being intrathoracic. The esophagus is fluid-filled and dilated to the level of the thoracic inlet. No pneumomediastinum. 2. No pneumothorax. 3. Cardiomegaly post CABG. Prominent dilatation of the central pulmonary arteries consistent with pulmonary arterial hypertension. 4. Chronic volume loss in the left lower lobe with occlusion of the left lower lobe bronchus, likely compressive. Chronic left basilar atelectasis. 5. Emphysema. Aortic Atherosclerosis (ICD10-I70.0) and Emphysema (ICD10-J43.9). Electronically Signed   By: Keith Rake M.D.   On: 11/19/2020 17:06   DG Chest Port 1 View  Result Date: 11/19/2020 CLINICAL DATA:  OG tube placement EXAM: PORTABLE CHEST 1 VIEW COMPARISON:  11/19/2020 FINDINGS: Endotracheal tube terminates at the level of the clavicular heads. Interval placement of  enteric tube with side port terminating at the level of the carina and distal tip terminating within the lower chest. Large hiatal hernia. Emphysematous changes of the lung fields. Left basilar opacities, better seen on recent CT. No pneumothorax. IMPRESSION: Interval placement of enteric tube. The distal tip is likely positioned within patient's large hiatal hernia. The side port is likely within the distal esophagus. Recommend advancement. Electronically Signed   By: Davina Poke D.O.   On: 11/19/2020 18:56   DG Chest Portable 1 View  Result Date: 11/19/2020 CLINICAL DATA:  Choking post intubation. EXAM: PORTABLE CHEST 1 VIEW COMPARISON:  December 31, 2017 FINDINGS: Endotracheal tube terminates 4 cm above the carina, appropriately position. Enlarged cardiac silhouette. No evidence of pneumothorax. Possible left pleural effusion. Osseous structures are without acute abnormality. Soft tissues are grossly normal. IMPRESSION: 1. Endotracheal tube terminates 4 cm above the carina, appropriately positioned. 2. Enlarged cardiac silhouette. 3. Possible left pleural effusion. Electronically Signed   By: Fidela Salisbury M.D.   On: 11/19/2020 16:13    EKG: I independently viewed the EKG done and my findings are as followed: A. fib with RVR with PVCs  Assessment/Plan Present on Admission:  Aspiration pneumonia ([place])  Atrial fibrillation with rapid ventricular response ([place])  Hyperglycemia  Dementia ([place])  Essential hypertension  GERD  Hyperlipidemia  Principal Problem:   Aspiration pneumonia ([place]) Active Problems:   Hyperlipidemia   Essential hypertension   GERD   Hiatal hernia   Hyperglycemia   Atrial fibrillation with rapid ventricular response (HCC)   COPD (chronic obstructive pulmonary disease) (HCC)   Dementia (HCC)   Frequent falls   Acute on chronic respiratory failure with hypoxia and hypercapnia (HCC)   Hypothermia   Acute on chronic respiratory failure with hypoxia and  hypercapnia possibly secondary to presumed aspiration pneumonia Patient was intubated, sedated and was on mechanical ventilation for airway protection ABG: 7.322, PCO2 39.6, PO2 336, bicarb 27.3 on FiO2 of 100% Bedside bronchoscopy done by ED physician showed no obstruction, this appears to be due to esophageal aspiration due to CT of chest showing esophagus is fluid-filled and dilated to the level of the thoracic inlet. Gastroenterology  was consulted and will come to ED to do a bedside EGD.  We shall await further recommendation. Continue IV ceftriaxone and metronidazole with plan to de-escalate/discontinue based on procalcitonin level  Hiatal hernia GERD CT chest without contrast showed large hiatal hernia with the entire stomach being intrathoracic. Gastroenterology was consulted by ED physician and I will come to the ED due to bedside EGD.  We shall await further recommendation Continue IV Protonix twice daily  Hypothermia Temperature 95.72; provide warm blanket TSH will be checked  A. fib with RVR Patient has a history of chronic atrial fibrillation on Xarelto CHA2DS2- VASc score   is = 3    Which is  equal to = 3.2 % annual risk of stroke  Continue Cardizem per home regimen Xarelto will be held at this time, continue heparin per pharmacy dosing  Essential hypertension (uncontrolled) Continue Cardizem  Hyperlipidemia Continue Zocor  Hyperglycemia possibly reactive CBG 148, continue to monitor blood glucose level with morning labs  History of frequent falls Continue fall precaution, neurochecks Consider PT/OT eval and treat when patient is extubated  Dementia Stable   DVT prophylaxis: Heparin drip  Code Status: DNR  Family Communication: None at bedside  Disposition Plan:  Patient is from:                        home Anticipated DC to:                   SNF or family members home Anticipated DC date:               2-3 days Anticipated DC barriers:           Patient is unstable to be discharged at this time due to being intubated and sedated secondary to inability to protect airway and hypoxia as well as hypercapnia in the setting of aspiration pneumonia    Consults called: Gastroenterology  Admission status: Inpatient    Bernadette Hoit MD [facility]  11/19/2020, 8:49 PM

## 2020-11-19 NOTE — Progress Notes (Addendum)
ANTICOAGULATION CONSULT NOTE - Initial Consult  Pharmacy Consult for Heparin  Indication: atrial fibrillation, history of PE  Allergies  Allergen Reactions   Lovenox [Enoxaparin Sodium] Swelling   Penicillins Hives    Has patient had a PCN reaction causing immediate rash, facial/tongue/throat swelling, SOB or lightheadedness with hypotension: Yes Has patient had a PCN reaction causing severe rash involving mucus membranes or skin necrosis: No Has patient had a PCN reaction that required hospitalization: Unknown Has patient had a PCN reaction occurring within the last 10 years: No If all of the above answers are "NO", then may proceed with Cephalosporin use.    Tape Other (See Comments)    SKIN WILL TEAR IF TAPED (MUST BE LIGHT & BRIEF OR USE COBAN WRAP, PLEASE!!)    Patient Measurements: Height: 5\' 11"  (180.3 cm) Weight: 60.3 kg (132 lb 15 oz) IBW/kg (Calculated) : 75.3  Vital Signs: Temp: 97.3 F (36.3 C) (06/19 2145) Temp Source: Axillary (06/19 1553) BP: 126/95 (06/19 2000) Pulse Rate: 65 (06/19 2145)  Labs: Recent Labs    11/19/20 1546  HGB 13.2  HCT 44.0  PLT 214  CREATININE 0.78    Estimated Creatinine Clearance: 56.5 mL/min (by C-G formula based on SCr of 0.78 mg/dL).   Medical History: Past Medical History:  Diagnosis Date   Anemia    ASD (atrial septal defect)    a. s/p repair 1983.   BPH (benign prostatic hyperplasia)    Cerebrovascular accident ([place]) 1990   Chronic bronchitis ([place])    a. h/o resp failure in setting of bronchitis vs PNA 05/2012.   Chronic diarrhea    COPD (chronic obstructive pulmonary disease) (HCC)    Coronary artery disease    a. Nonobstructive by cath 2009 and 09/06/14   Depression    Dysphagia, unspecified(787.20)    Erosive gastritis    H/o GIB felt secondary to this   Esophagitis    Falls frequently    Gastric polyp    Gastric ulcer    GERD (gastroesophageal reflux disease)    Hearing impairment    Hiatal hernia     History of upper gastrointestinal hemorrhage    dr. Maurene Capes, GI   Hyperlipidemia    Hypertension    Internal hemorrhoids    LV dysfunction    a. EF 45-50% by echo 05/2012.   Permanent atrial fibrillation (HCC)    [facility]R [facility] at [facility].   Vision problems      Assessment: 87 y/o M with esophageal obstruction after eating chicken, required intubation in the ED, on Xarelto PTA for afib, holding Xarelto and starting heparin while intubated. It has been greater that 24 hours since last Xarelto dose (6/18 at ~1700), ok to start heparin now. Anticipate using aPTT to dose for now given Xarelto influence on anti-Xa levels. CBC/renal function good. Noted Lovenox allergy on profile, patient has tolerated heparin drip in the past when he had a PE.   Goal of Therapy:  Heparin level 0.3-0.7 units/ml aPTT 66-102 seconds Monitor platelets by anticoagulation protocol: Yes   Plan:  Start heparin drip at 850 units/hr 0800 heparin level and aPTT Daily CBC, heparin level, and aPTT Monitor for bleeding  Narda Bonds, PharmD, BCPS Clinical Pharmacist Phone: [phone]

## 2020-11-19 NOTE — ED Triage Notes (Signed)
Pt arrived via [facility] stating pt choked on a piece of chicken.

## 2020-11-20 ENCOUNTER — Inpatient Hospital Stay (HOSPITAL_COMMUNITY): Payer: Medicare PPO

## 2020-11-20 ENCOUNTER — Encounter (HOSPITAL_COMMUNITY): Payer: Self-pay | Admitting: Internal Medicine

## 2020-11-20 DIAGNOSIS — J9622 Acute and chronic respiratory failure with hypercapnia: Secondary | ICD-10-CM

## 2020-11-20 DIAGNOSIS — T18108A Unspecified foreign body in esophagus causing other injury, initial encounter: Secondary | ICD-10-CM

## 2020-11-20 DIAGNOSIS — J9621 Acute and chronic respiratory failure with hypoxia: Secondary | ICD-10-CM

## 2020-11-20 DIAGNOSIS — Z7189 Other specified counseling: Secondary | ICD-10-CM

## 2020-11-20 DIAGNOSIS — Z515 Encounter for palliative care: Secondary | ICD-10-CM

## 2020-11-20 DIAGNOSIS — J690 Pneumonitis due to inhalation of food and vomit: Secondary | ICD-10-CM | POA: Diagnosis not present

## 2020-11-20 DIAGNOSIS — T17908A Unspecified foreign body in respiratory tract, part unspecified causing other injury, initial encounter: Secondary | ICD-10-CM | POA: Diagnosis not present

## 2020-11-20 DIAGNOSIS — J449 Chronic obstructive pulmonary disease, unspecified: Secondary | ICD-10-CM

## 2020-11-20 DIAGNOSIS — E43 Unspecified severe protein-calorie malnutrition: Secondary | ICD-10-CM | POA: Insufficient documentation

## 2020-11-20 DIAGNOSIS — K222 Esophageal obstruction: Secondary | ICD-10-CM

## 2020-11-20 DIAGNOSIS — J9600 Acute respiratory failure, unspecified whether with hypoxia or hypercapnia: Secondary | ICD-10-CM

## 2020-11-20 LAB — CBC
HCT: 40.4 % (ref 39.0–52.0)
Hemoglobin: 12.1 g/dL — ABNORMAL LOW (ref 13.0–17.0)
MCH: 26.2 pg (ref 26.0–34.0)
MCHC: 30 g/dL (ref 30.0–36.0)
MCV: 87.6 fL (ref 80.0–100.0)
Platelets: 144 10*3/uL — ABNORMAL LOW (ref 150–400)
RBC: 4.61 MIL/uL (ref 4.22–5.81)
RDW: 15.5 % (ref 11.5–15.5)
WBC: 9.6 10*3/uL (ref 4.0–10.5)
nRBC: 0 % (ref 0.0–0.2)

## 2020-11-20 LAB — APTT
aPTT: 59 seconds — ABNORMAL HIGH (ref 24–36)
aPTT: 118 seconds — ABNORMAL HIGH (ref 24–36)

## 2020-11-20 LAB — COMPREHENSIVE METABOLIC PANEL
ALT: 17 U/L (ref 0–44)
AST: 32 U/L (ref 15–41)
Albumin: 3.5 g/dL (ref 3.5–5.0)
Alkaline Phosphatase: 59 U/L (ref 38–126)
Anion gap: 9 (ref 5–15)
BUN: 31 mg/dL — ABNORMAL HIGH (ref 8–23)
CO2: 28 mmol/L (ref 22–32)
Calcium: 9 mg/dL (ref 8.9–10.3)
Chloride: 105 mmol/L (ref 98–111)
Creatinine, Ser: 1.04 mg/dL (ref 0.61–1.24)
GFR, Estimated: >60 mL/min (ref >60)
Glucose, Bld: 104 mg/dL — ABNORMAL HIGH (ref 70–99)
Potassium: 3.5 mmol/L (ref 3.5–5.1)
Sodium: 142 mmol/L (ref 135–145)
Total Bilirubin: 0.6 mg/dL (ref 0.3–1.2)
Total Protein: 6.2 g/dL — ABNORMAL LOW (ref 6.5–8.1)

## 2020-11-20 LAB — URINALYSIS, ROUTINE W REFLEX MICROSCOPIC
Bacteria, UA: NONE SEEN
Bilirubin Urine: NEGATIVE
Glucose, UA: NEGATIVE mg/dL
Ketones, ur: 5 mg/dL — AB
Nitrite: NEGATIVE
Protein, ur: 100 mg/dL — AB
RBC / HPF: >50 RBC/hpf — ABNORMAL HIGH (ref 0–5)
Specific Gravity, Urine: 1.032 — ABNORMAL HIGH (ref 1.005–1.030)
WBC, UA: >50 WBC/hpf — ABNORMAL HIGH (ref 0–5)
pH: 5 (ref 5.0–8.0)

## 2020-11-20 LAB — MAGNESIUM
Magnesium: 1.7 mg/dL (ref 1.7–2.4)
Magnesium: 1.6 mg/dL — ABNORMAL LOW (ref 1.7–2.4)

## 2020-11-20 LAB — GLUCOSE, CAPILLARY
Glucose-Capillary: 109 mg/dL — ABNORMAL HIGH (ref 70–99)
Glucose-Capillary: 91 mg/dL (ref 70–99)
Glucose-Capillary: 83 mg/dL (ref 70–99)

## 2020-11-20 LAB — PHOSPHORUS
Phosphorus: 1.7 mg/dL — ABNORMAL LOW (ref 2.5–4.6)
Phosphorus: 2.8 mg/dL (ref 2.5–4.6)

## 2020-11-20 LAB — PROTIME-INR
INR: 1.5 — ABNORMAL HIGH (ref 0.8–1.2)
Prothrombin Time: 18.4 seconds — ABNORMAL HIGH (ref 11.4–15.2)

## 2020-11-20 LAB — PROCALCITONIN: Procalcitonin: 0.63 ng/mL

## 2020-11-20 LAB — TSH: TSH: 1.681 u[IU]/mL (ref 0.350–4.500)

## 2020-11-20 LAB — HEPARIN LEVEL (UNFRACTIONATED): Heparin Unfractionated: >1.1 IU/mL — ABNORMAL HIGH (ref 0.30–0.70)

## 2020-11-20 LAB — MRSA NEXT GEN BY PCR, NASAL: MRSA by PCR Next Gen: NOT DETECTED

## 2020-11-20 MED ORDER — METOPROLOL TARTRATE 5 MG/5ML IV SOLN
2.5000 mg | Freq: Three times a day (TID) | INTRAVENOUS | Status: DC
  Administered 2020-11-20 – 2020-11-23 (×8): 2.5 mg via INTRAVENOUS
  Filled 2020-11-20 (×9): qty 5

## 2020-11-20 MED ORDER — ORAL CARE MOUTH RINSE
15.0000 mL | Freq: Two times a day (BID) | OROMUCOSAL | Status: DC
  Administered 2020-11-20 – 2020-11-23 (×6): 15 mL via OROMUCOSAL

## 2020-11-20 MED ORDER — HEPARIN (PORCINE) 25000 UT/250ML-% IV SOLN
1050.0000 [IU]/h | INTRAVENOUS | Status: DC
  Administered 2020-11-20 – 2020-11-21 (×2): 850 [IU]/h via INTRAVENOUS
  Administered 2020-11-22: 1050 [IU]/h via INTRAVENOUS
  Filled 2020-11-20 (×3): qty 250

## 2020-11-20 MED ORDER — VITAL AF 1.2 CAL PO LIQD
1000.0000 mL | ORAL | Status: DC
  Filled 2020-11-20: qty 1000

## 2020-11-20 MED ORDER — SIMETHICONE 40 MG/0.6ML PO SUSP
ORAL | Status: DC | PRN
  Administered 2020-11-19: 5 mL

## 2020-11-20 MED ORDER — ATORVASTATIN CALCIUM 10 MG PO TABS
10.0000 mg | ORAL_TABLET | Freq: Every day | ORAL | Status: DC
  Administered 2020-11-22: 10 mg via ORAL
  Filled 2020-11-20 (×2): qty 1

## 2020-11-20 NOTE — Progress Notes (Addendum)
PROGRESS NOTE    [name]  WLK:[id number] DOB: September 28, 1932 DOA: 11/19/2020 PCP: [name], MD   Brief Narrative:   [name] is an 87 y.o. male with medical history significant for Atria fib on Xarelto, HTN, COPD chronic respiratory failure, dementia and frequent falls who presents to the emergency department via EMS after choking on chicken at home.  He was admitted with acute on chronic hypoxemic and hypercapnic respiratory failure secondary to presumed aspiration pneumonia and had to urgently undergo EGD due to esophageal obstruction with pieces of chicken.  He was intubated for airway protection and has subsequently been extubated on 6/20.  Assessment & Plan:   Principal Problem:   Aspiration pneumonia ([place]) Active Problems:   Hyperlipidemia   Essential hypertension   GERD   Hiatal hernia   Hyperglycemia   Atrial fibrillation with rapid ventricular response (HCC)   COPD (chronic obstructive pulmonary disease) (HCC)   Dementia (HCC)   Frequent falls   Acute on chronic respiratory failure with hypoxia and hypercapnia (HCC)   Hypothermia   Protein-calorie malnutrition, severe   Aspiration into airway   Acute on chronic hypoxemic and hypercapnic respiratory failure-multifactorial -Noted to be in the setting of esophageal obstruction along with aspiration pneumonia -Continue IV Rocephin and metronidazole -Appreciate PCCM involvement and patient has now been extubated -Palliative medicine discussing with family regarding candidacy for future intubation  Esophageal obstruction with foreign bodies -Status post EGD by GI on 6/19 with removal of pieces of chicken -Remains on PPI as prescribed -Noted to also have some grade a esophagitis -May consider esophageal dilation as appropriate per GI  Hypothermia-resolved -TSH 1.68  Atrial fibrillation -Continue heparin drip, holding Xarelto -Metoprolol IV every 8 hours for heart rate control  Essential  hypertension-currently hypotensive -Likely due to propofol -Cardizem and Imdur currently held -Low-dose IV metoprolol  Dyslipidemia -Atorvastatin  Frequent falls/dementia/failure to thrive -Likely a candidate for SNF versus home with hospice -Further palliative care discussions pending   DVT prophylaxis: Heparin drip Code Status: DNR Family Communication: Spoke with spouse on phone 6/20 Disposition Plan:  Status is: Inpatient  Remains inpatient appropriate because:Altered mental status, IV treatments appropriate due to intensity of illness or inability to take PO, and Inpatient level of care appropriate due to severity of illness  Dispo: The patient is from: Home              Anticipated d/c is to: Home              Patient currently is not medically stable to d/c.   Difficult to place patient No  Consultants:  GI PCCM Palliative care  Procedures:  EGD 6/19 Intubation 6/19  Antimicrobials:  Anti-infectives (From admission, onward)    Start     Dose/Rate Route Frequency Ordered Stop   11/19/20 1945  metroNIDAZOLE (FLAGYL) IVPB 500 mg        500 mg 100 mL/hr over 60 Minutes Intravenous Every 8 hours 11/19/20 1931     11/19/20 1930  aztreonam (AZACTAM) 2 g in sodium chloride 0.9 % 100 mL IVPB  Status:  Discontinued        2 g 200 mL/hr over 30 Minutes Intravenous Every 8 hours 11/19/20 1921 11/19/20 1926   11/19/20 1930  cefTRIAXone (ROCEPHIN) 2 g in sodium chloride 0.9 % 100 mL IVPB        2 g 200 mL/hr over 30 Minutes Intravenous Every 24 hours 11/19/20 1926  Subjective: Patient seen and evaluated today and is sedated on the ventilator.  Objective: Vitals:   11/20/20 0700 11/20/20 0900 11/20/20 1000 11/20/20 1200  BP: (!) 109/52 (!) 70/40 (!) 108/54   Pulse: 62 66  72  Resp: 14  (!) 28 (!) 24  Temp: 99.5 F (37.5 C) 99.32 F (37.4 C) 98.78 F (37.1 C) 98.96 F (37.2 C)  TempSrc:      SpO2: 100% 100%    Weight:      Height:         Intake/Output Summary (Last 24 hours) at 11/20/2020 1456 Last data filed at 11/20/2020 0757 Gross per 24 hour  Intake 512.26 ml  Output 290 ml  Net 222.26 ml   Filed Weights   11/19/20 1608  Weight: 60.3 kg    Examination:  General exam: Appears sedated on ventilator, appears frail Respiratory system: Clear to auscultation. Respiratory effort normal.  FiO2 40%, intubated Cardiovascular system: S1 & S2 heard, RRR.  Gastrointestinal system: Abdomen is soft Central nervous system: Sedated Extremities: No edema Skin: No significant lesions noted Psychiatry: Cannot be assessed    Data Reviewed: I have personally reviewed following labs and imaging studies  CBC: Recent Labs  Lab 11/19/20 1546 11/20/20 0440  WBC 8.3 9.6  NEUTROABS 5.3  --   HGB 13.2 12.1*  HCT 44.0 40.4  MCV 88.4 87.6  PLT 214 629*   Basic Metabolic Panel: Recent Labs  Lab 11/19/20 1546 11/20/20 0440  NA 140 142  K 3.9 3.5  CL 103 105  CO2 29 28  GLUCOSE 148* 104*  BUN 28* 31*  CREATININE 0.78 1.04  CALCIUM 9.0 9.0  MG  --  1.7  PHOS  --  1.7*   GFR: Estimated Creatinine Clearance: 43.5 mL/min (by C-G formula based on SCr of 1.04 mg/dL). Liver Function Tests: Recent Labs  Lab 11/19/20 1546 11/20/20 0440  AST 23 32  ALT 14 17  ALKPHOS 74 59  BILITOT 0.3 0.6  PROT 7.6 6.2*  ALBUMIN 4.4 3.5   No results for input(s): LIPASE, AMYLASE in the last 168 hours. No results for input(s): AMMONIA in the last 168 hours. Coagulation Profile: Recent Labs  Lab 11/20/20 0440  INR 1.5*   Cardiac Enzymes: No results for input(s): CKTOTAL, CKMB, CKMBINDEX, TROPONINI in the last 168 hours. BNP (last 3 results) No results for input(s): PROBNP in the last 8760 hours. HbA1C: No results for input(s): HGBA1C in the last 72 hours. CBG: Recent Labs  Lab 11/20/20 1202  GLUCAP 109*   Lipid Profile: No results for input(s): CHOL, HDL, LDLCALC, TRIG, CHOLHDL, LDLDIRECT in the last 72  hours. Thyroid Function Tests: Recent Labs    11/20/20 0440  TSH 1.681   Anemia Panel: No results for input(s): VITAMINB12, FOLATE, FERRITIN, TIBC, IRON, RETICCTPCT in the last 72 hours. Sepsis Labs: Recent Labs  Lab 11/20/20 0440  PROCALCITON 0.63    Recent Results (from the past 240 hour(s))  Resp Panel by RT-PCR (Flu A&B, Covid) Nasopharyngeal Swab     Status: None   Collection Time: 11/19/20  3:54 PM   Specimen: Nasopharyngeal Swab; Nasopharyngeal(NP) swabs in vial transport medium  Result Value Ref Range Status   SARS Coronavirus 2 by RT PCR NEGATIVE NEGATIVE Final    Comment: (NOTE) SARS-CoV-2 target nucleic acids are NOT DETECTED.  The SARS-CoV-2 RNA is generally detectable in upper respiratory specimens during the acute phase of infection. The lowest concentration of SARS-CoV-2 viral copies this assay can detect  is 138 copies/mL. A negative result does not preclude SARS-Cov-2 infection and should not be used as the sole basis for treatment or other patient management decisions. A negative result may occur with  improper specimen collection/handling, submission of specimen other than nasopharyngeal swab, presence of viral mutation(s) within the areas targeted by this assay, and inadequate number of viral copies(<138 copies/mL). A negative result must be combined with clinical observations, patient history, and epidemiological information. The expected result is Negative.  Fact Sheet for Patients:  EntrepreneurPulse.com.au  Fact Sheet for Healthcare Providers:  IncredibleEmployment.be  This test is no t yet approved or cleared by the [place] FDA and  has been authorized for detection and/or diagnosis of SARS-CoV-2 by FDA under an Emergency Use Authorization (EUA). This EUA will remain  in effect (meaning this test can be used) for the duration of the COVID-19 declaration under Section 564(b)(1) of the Act,  21 U.S.C.section 360bbb-3(b)(1), unless the authorization is terminated  or revoked sooner.       Influenza A by PCR NEGATIVE NEGATIVE Final   Influenza B by PCR NEGATIVE NEGATIVE Final    Comment: (NOTE) The Xpert Xpress SARS-CoV-2/FLU/RSV plus assay is intended as an aid in the diagnosis of influenza from Nasopharyngeal swab specimens and should not be used as a sole basis for treatment. Nasal washings and aspirates are unacceptable for Xpert Xpress SARS-CoV-2/FLU/RSV testing.  Fact Sheet for Patients: EntrepreneurPulse.com.au  Fact Sheet for Healthcare Providers: IncredibleEmployment.be  This test is not yet approved or cleared by the [place] FDA and has been authorized for detection and/or diagnosis of SARS-CoV-2 by FDA under an Emergency Use Authorization (EUA). This EUA will remain in effect (meaning this test can be used) for the duration of the COVID-19 declaration under Section 564(b)(1) of the Act, 21 U.S.C. section 360bbb-3(b)(1), unless the authorization is terminated or revoked.  Performed at [facility], [address] [place], [place] [address]   MRSA Next Gen by PCR, Nasal     Status: None   Collection Time: 11/19/20  9:40 PM   Specimen: Nasal Mucosa; Nasal Swab  Result Value Ref Range Status   MRSA by PCR Next Gen NOT DETECTED NOT DETECTED Final    Comment: (NOTE) The GeneXpert MRSA Assay (FDA approved for NASAL specimens only), is one component of a comprehensive MRSA colonization surveillance program. It is not intended to diagnose MRSA infection nor to guide or monitor treatment for MRSA infections. Test performance is not FDA approved in patients less than 21 years old. Performed at [facility], [address] [place], [place] [address]          Radiology Studies: DG Abd 1 View  Result Date: 11/19/2020 CLINICAL DATA:  Check gastric catheter placement EXAM: ABDOMEN - 1 VIEW COMPARISON:  None. FINDINGS:  Gastric catheter is noted within the stomach. Loop is noted within the hiatal hernia above the diaphragm. No obstructive changes are noted. IMPRESSION: Gastric catheter with tip in the distal stomach. Electronically Signed   By: Inez Catalina M.D.   On: 11/19/2020 23:39   CT HEAD WO CONTRAST  Result Date: 11/20/2020 CLINICAL DATA:  Acute neurologic deficit EXAM: CT HEAD WITHOUT CONTRAST TECHNIQUE: Contiguous axial images were obtained from the base of the skull through the vertex without intravenous contrast. COMPARISON:  None. FINDINGS: Brain: There is no mass, hemorrhage or extra-axial collection. There is generalized atrophy without lobar predilection. Hypodensity of the white matter is most commonly associated with chronic microvascular disease. Vascular: Atherosclerotic calcification  of the internal carotid arteries at the skull base. No abnormal hyperdensity of the major intracranial arteries or dural venous sinuses. Skull: The visualized skull base, calvarium and extracranial soft tissues are normal. Sinuses/Orbits: No fluid levels or advanced mucosal thickening of the visualized paranasal sinuses. No mastoid or middle ear effusion. The orbits are normal. IMPRESSION: Generalized atrophy and chronic microvascular ischemia without acute intracranial abnormality. Electronically Signed   By: Ulyses Jarred M.D.   On: 11/20/2020 02:27   CT Chest Wo Contrast  Result Date: 11/19/2020 CLINICAL DATA:  Provided history of pneumothorax. Patient choked on a piece of chicken. EXAM: CT CHEST WITHOUT CONTRAST TECHNIQUE: Multidetector CT imaging of the chest was performed following the standard protocol without IV contrast. COMPARISON:  Radiograph earlier today.  Chest CT 10 17 FINDINGS: Cardiovascular: Aortic atherosclerosis and tortuosity. Post median sternotomy and CABG. Prominent dilatation of the central pulmonary arteries with main pulmonary artery measuring 4 cm, right pulmonary artery measuring 4.2 cm and left  pulmonary artery measuring 3.5 cm. Dense coronary artery calcification of native coronary arteries. Multi chamber cardiomegaly. No pericardial effusion. Mediastinum/Nodes: Large hiatal hernia with the entire stomach being intrathoracic. There is a granular axial a rotation of the stomach. The esophagus is dilated to the level of the thoracic inlet. Upper esophagus is patulous. No evidence of pneumomediastinum. Endotracheal tube tip above the carina. No enlarged mediastinal lymph nodes. Limited hilar assessment. No thyroid nodule. Lungs/Pleura: No pneumothorax. Chronic volume loss in the left lower lobe with occlusion of the left lower lobe bronchus, likely compressive. Chronic left basilar atelectasis. Minimal scarring in the anterior and lateral right upper lobe, similar to prior exam. Emphysema. Minimal right lower lobe bronchiectasis. No acute confluent airspace disease. No pulmonary mass. No pleural fluid. Upper Abdomen: Large hiatal hernia with the entire stomach being intrathoracic. Cholecystectomy. No acute upper abdominal findings. Musculoskeletal: Exaggerated thoracic kyphosis with bony under mineralization. Chronic L2 compression fracture is unchanged from 11/18/2019 abdominal CT. Median sternotomy. No acute osseous abnormalities are seen. IMPRESSION: 1. Large hiatal hernia with the entire stomach being intrathoracic. The esophagus is fluid-filled and dilated to the level of the thoracic inlet. No pneumomediastinum. 2. No pneumothorax. 3. Cardiomegaly post CABG. Prominent dilatation of the central pulmonary arteries consistent with pulmonary arterial hypertension. 4. Chronic volume loss in the left lower lobe with occlusion of the left lower lobe bronchus, likely compressive. Chronic left basilar atelectasis. 5. Emphysema. Aortic Atherosclerosis (ICD10-I70.0) and Emphysema (ICD10-J43.9). Electronically Signed   By: Keith Rake M.D.   On: 11/19/2020 17:06   DG CHEST PORT 1 VIEW  Result Date:  11/20/2020 CLINICAL DATA:  Aspiration EXAM: PORTABLE CHEST 1 VIEW COMPARISON:  11/19/2020 and prior studies FINDINGS: The cardiomediastinal silhouette is unchanged. A large hiatal hernia is present. An endotracheal tube with tip 4.5 cm above the carina and NG tube extending through the intrathoracic hiatal hernia and into the UPPER abdomen noted. LEFT LOWER lung atelectasis is again noted. No pneumothorax identified. IMPRESSION: No significant change. Large hiatal hernia and LEFT LOWER lung atelectasis. Electronically Signed   By: Margarette Canada M.D.   On: 11/20/2020 13:44   DG Chest Port 1 View  Result Date: 11/19/2020 CLINICAL DATA:  OG tube placement EXAM: PORTABLE CHEST 1 VIEW COMPARISON:  11/19/2020 FINDINGS: Endotracheal tube terminates at the level of the clavicular heads. Interval placement of enteric tube with side port terminating at the level of the carina and distal tip terminating within the lower chest. Large hiatal hernia. Emphysematous changes of the  lung fields. Left basilar opacities, better seen on recent CT. No pneumothorax. IMPRESSION: Interval placement of enteric tube. The distal tip is likely positioned within patient's large hiatal hernia. The side port is likely within the distal esophagus. Recommend advancement. Electronically Signed   By: Davina Poke D.O.   On: 11/19/2020 18:56   DG Chest Portable 1 View  Result Date: 11/19/2020 CLINICAL DATA:  Choking post intubation. EXAM: PORTABLE CHEST 1 VIEW COMPARISON:  December 31, 2017 FINDINGS: Endotracheal tube terminates 4 cm above the carina, appropriately position. Enlarged cardiac silhouette. No evidence of pneumothorax. Possible left pleural effusion. Osseous structures are without acute abnormality. Soft tissues are grossly normal. IMPRESSION: 1. Endotracheal tube terminates 4 cm above the carina, appropriately positioned. 2. Enlarged cardiac silhouette. 3. Possible left pleural effusion. Electronically Signed   By: Fidela Salisbury M.D.   On: 11/19/2020 16:13   Bedside bronchoscopy  Result Date: 11/19/2020 Daleen Bo, MD     11/19/2020  9:15 PM Bedside bronchoscopy Date/Time: 11/19/2020 5:49 PM Performed by: Daleen Bo, MD Authorized by: Daleen Bo, MD Consent: Verbal consent obtained. Risks and benefits: risks, benefits and alternatives were discussed Consent given by: spouse Imaging studies: imaging studies available Patient identity confirmed: hospital-assigned identification number Time out: Immediately prior to procedure a "time out" was called to verify the correct patient, procedure, equipment, support staff and site/side marked as required. Local anesthesia used: no Anesthesia: Local anesthesia used: no Sedation: Patient sedated: yes Sedatives: propofol Patient tolerance: patient tolerated the procedure well with no immediate complications Comments: Patient was previously sedated for ventilator management.  Bronchoscopy done to 4 levels on right, 5 levels on the left, no foreign body obstruction, fluid collection or significant mucus accumulation.        Scheduled Meds:  atorvastatin  10 mg Oral QHS   chlorhexidine gluconate (MEDLINE KIT)  15 mL Mouth Rinse BID   Chlorhexidine Gluconate Cloth  6 each Topical Daily   mouth rinse  15 mL Mouth Rinse 10 times per day   metoprolol tartrate  2.5 mg Intravenous Q8H   pantoprazole (PROTONIX) IV  40 mg Intravenous Q12H   Continuous Infusions:  cefTRIAXone (ROCEPHIN)  IV 2 g (11/19/20 2022)   feeding supplement (VITAL AF 1.2 CAL) Stopped (11/20/20 1312)   heparin 1,000 Units/hr (11/20/20 1007)   metronidazole Stopped (11/20/20 6195)     LOS: 1 day    Time spent: 35 minutes    Kouper Spinella D Manuella Ghazi, DO [facility]  If 7PM-7AM, please contact night-coverage [email or web address] 11/20/2020, 2:56 PM

## 2020-11-20 NOTE — Progress Notes (Signed)
Subjective: [name] is an 87 year old caucasian male who presented to the ER on 6/19 with respiratory distress secondary to esophageal obstruction after eating some chicken. Patient was intubated upon arrival r/t O2 sat in the 70s and absent left lung sounds. EGD performed by Dr. Laural Golden yesterday for removal of food particles with multiple passes to obtain all pieces successfully.  Patient remains intubated and on sedation today. Per RN, patient's gag reflex has improved some, initially had absent gag reflex. No obvious signs of GI bleeding, no nausea, vomiting or diarrhea. Patient in no acute distress. Abdomen soft and non-distended.  Objective: Vital signs in last 24 hours: Temp:  [95.72 F (35.4 C)-102 F (38.9 C)] 99.5 F (37.5 C) (06/20 0700) Pulse Rate:  [62-112] 62 (06/20 0700) Resp:  [12-30] 14 (06/20 0700) BP: (63-211)/(32-135) 109/52 (06/20 0700) SpO2:  [86 %-100 %] 100 % (06/20 0700) FiO2 (%):  [40 %-100 %] 40 % (06/20 0826) Weight:  [60.3 kg] 60.3 kg (06/19 1608)   General:   Patient remains intubated and sedated.  Head:  Normocephalic and atraumatic. Heart:  S1, S2 present, no murmurs noted.  Lungs: Diminished breath sounds in left lower lung.  Abdomen:  Bowel sounds present, soft, non-tender, non-distended. No HSM or hernias noted. No rebound or guarding. No masses appreciated  Msk:  Symmetrical without gross deformities.  Extremities:  Without edema. Neurologic:  patient sedated, unable to assess.  Skin:  Warm and dry, intact.  Psych:  patient sedated, unable to assess.   Intake/Output from previous day: 06/19 0701 - 06/20 0700 In: 334.4 [I.V.:208.4; IV Piggyback:125.9] Out: 290 [Urine:150; Emesis/NG output:140] Intake/Output this shift: Total I/O In: 177.9 [I.V.:77.9; IV Piggyback:100] Out: -   Lab Results: Recent Labs    11/19/20 1546 11/20/20 0440  WBC 8.3 9.6  HGB 13.2 12.1*  HCT 44.0 40.4  PLT 214 144*   BMET Recent Labs     11/19/20 1546 11/20/20 0440  NA 140 142  K 3.9 3.5  CL 103 105  CO2 29 28  GLUCOSE 148* 104*  BUN 28* 31*  CREATININE 0.78 1.04  CALCIUM 9.0 9.0   LFT Recent Labs    11/19/20 1546 11/20/20 0440  PROT 7.6 6.2*  ALBUMIN 4.4 3.5  AST 23 32  ALT 14 17  ALKPHOS 74 59  BILITOT 0.3 0.6   PT/INR Recent Labs    11/20/20 0440  LABPROT 18.4*  INR 1.5*    Studies/Results: DG Abd 1 View  Result Date: 11/19/2020 CLINICAL DATA:  Check gastric catheter placement EXAM: ABDOMEN - 1 VIEW COMPARISON:  None. FINDINGS: Gastric catheter is noted within the stomach. Loop is noted within the hiatal hernia above the diaphragm. No obstructive changes are noted. IMPRESSION: Gastric catheter with tip in the distal stomach. Electronically Signed   By: Inez Catalina M.D.   On: 11/19/2020 23:39   CT HEAD WO CONTRAST  Result Date: 11/20/2020 CLINICAL DATA:  Acute neurologic deficit EXAM: CT HEAD WITHOUT CONTRAST TECHNIQUE: Contiguous axial images were obtained from the base of the skull through the vertex without intravenous contrast. COMPARISON:  None. FINDINGS: Brain: There is no mass, hemorrhage or extra-axial collection. There is generalized atrophy without lobar predilection. Hypodensity of the white matter is most commonly associated with chronic microvascular disease. Vascular: Atherosclerotic calcification of the internal carotid arteries at the skull base. No abnormal hyperdensity of the major intracranial arteries or dural venous sinuses. Skull: The visualized skull base, calvarium and extracranial soft tissues are normal.  Sinuses/Orbits: No fluid levels or advanced mucosal thickening of the visualized paranasal sinuses. No mastoid or middle ear effusion. The orbits are normal. IMPRESSION: Generalized atrophy and chronic microvascular ischemia without acute intracranial abnormality. Electronically Signed   By: Ulyses Jarred M.D.   On: 11/20/2020 02:27   CT Chest Wo Contrast  Result Date:  11/19/2020 CLINICAL DATA:  Provided history of pneumothorax. Patient choked on a piece of chicken. EXAM: CT CHEST WITHOUT CONTRAST TECHNIQUE: Multidetector CT imaging of the chest was performed following the standard protocol without IV contrast. COMPARISON:  Radiograph earlier today.  Chest CT 10 17 FINDINGS: Cardiovascular: Aortic atherosclerosis and tortuosity. Post median sternotomy and CABG. Prominent dilatation of the central pulmonary arteries with main pulmonary artery measuring 4 cm, right pulmonary artery measuring 4.2 cm and left pulmonary artery measuring 3.5 cm. Dense coronary artery calcification of native coronary arteries. Multi chamber cardiomegaly. No pericardial effusion. Mediastinum/Nodes: Large hiatal hernia with the entire stomach being intrathoracic. There is a granular axial a rotation of the stomach. The esophagus is dilated to the level of the thoracic inlet. Upper esophagus is patulous. No evidence of pneumomediastinum. Endotracheal tube tip above the carina. No enlarged mediastinal lymph nodes. Limited hilar assessment. No thyroid nodule. Lungs/Pleura: No pneumothorax. Chronic volume loss in the left lower lobe with occlusion of the left lower lobe bronchus, likely compressive. Chronic left basilar atelectasis. Minimal scarring in the anterior and lateral right upper lobe, similar to prior exam. Emphysema. Minimal right lower lobe bronchiectasis. No acute confluent airspace disease. No pulmonary mass. No pleural fluid. Upper Abdomen: Large hiatal hernia with the entire stomach being intrathoracic. Cholecystectomy. No acute upper abdominal findings. Musculoskeletal: Exaggerated thoracic kyphosis with bony under mineralization. Chronic L2 compression fracture is unchanged from 11/18/2019 abdominal CT. Median sternotomy. No acute osseous abnormalities are seen. IMPRESSION: 1. Large hiatal hernia with the entire stomach being intrathoracic. The esophagus is fluid-filled and dilated to the  level of the thoracic inlet. No pneumomediastinum. 2. No pneumothorax. 3. Cardiomegaly post CABG. Prominent dilatation of the central pulmonary arteries consistent with pulmonary arterial hypertension. 4. Chronic volume loss in the left lower lobe with occlusion of the left lower lobe bronchus, likely compressive. Chronic left basilar atelectasis. 5. Emphysema. Aortic Atherosclerosis (ICD10-I70.0) and Emphysema (ICD10-J43.9). Electronically Signed   By: Keith Rake M.D.   On: 11/19/2020 17:06   DG Chest Port 1 View  Result Date: 11/19/2020 CLINICAL DATA:  OG tube placement EXAM: PORTABLE CHEST 1 VIEW COMPARISON:  11/19/2020 FINDINGS: Endotracheal tube terminates at the level of the clavicular heads. Interval placement of enteric tube with side port terminating at the level of the carina and distal tip terminating within the lower chest. Large hiatal hernia. Emphysematous changes of the lung fields. Left basilar opacities, better seen on recent CT. No pneumothorax. IMPRESSION: Interval placement of enteric tube. The distal tip is likely positioned within patient's large hiatal hernia. The side port is likely within the distal esophagus. Recommend advancement. Electronically Signed   By: Davina Poke D.O.   On: 11/19/2020 18:56   DG Chest Portable 1 View  Result Date: 11/19/2020 CLINICAL DATA:  Choking post intubation. EXAM: PORTABLE CHEST 1 VIEW COMPARISON:  December 31, 2017 FINDINGS: Endotracheal tube terminates 4 cm above the carina, appropriately position. Enlarged cardiac silhouette. No evidence of pneumothorax. Possible left pleural effusion. Osseous structures are without acute abnormality. Soft tissues are grossly normal. IMPRESSION: 1. Endotracheal tube terminates 4 cm above the carina, appropriately positioned. 2. Enlarged cardiac silhouette. 3. Possible  left pleural effusion. Electronically Signed   By: Fidela Salisbury M.D.   On: 11/19/2020 16:13   Bedside bronchoscopy  Result Date:  11/19/2020 Daleen Bo, MD     11/19/2020  9:15 PM Bedside bronchoscopy Date/Time: 11/19/2020 5:49 PM Performed by: Daleen Bo, MD Authorized by: Daleen Bo, MD Consent: Verbal consent obtained. Risks and benefits: risks, benefits and alternatives were discussed Consent given by: spouse Imaging studies: imaging studies available Patient identity confirmed: hospital-assigned identification number Time out: Immediately prior to procedure a "time out" was called to verify the correct patient, procedure, equipment, support staff and site/side marked as required. Local anesthesia used: no Anesthesia: Local anesthesia used: no Sedation: Patient sedated: yes Sedatives: propofol Patient tolerance: patient tolerated the procedure well with no immediate complications Comments: Patient was previously sedated for ventilator management.  Bronchoscopy done to 4 levels on right, 5 levels on the left, no foreign body obstruction, fluid collection or significant mucus accumulation.    Assessment and Plan: trexton escamilla is an 87 year old caucasian male with multiple medical problems, including GERD, COPD, HTN, dementia, atrial fibrillation and previous pulmonary embolism for which he was on xarelto for upon his arrival.  Known distal esophageal stricture and known large hiatal hernia with intrathoracic stomach. He presented to the ED on 6/19 with respiratory distress secondary to esophageal obstruction after eating some chicken. Aspiration pneumonia was also suspected, treated with rocephin and flagyl. Respiratory status stabilized after intubation. Patient had EGD performed by Dr. Laural Golden on 6/19 with successful removal of all food particles. Unable to dilate esophagus at time of EGD r/t recent dose of xarelto. Patient receiving pantoprazole 40mg  IV q 12 hours. Xarelto held, and patient started on IV Heparin this morning. Patient remains intubated and sedated at this time. No GI bleeding, nausea, vomiting or diarrhea.  Abdomen soft and non-distended.   Esophageal obstruction: food particles removed via EGD peformed by Dr. Laural Golden, multiple passes needed, all particles were removed. Grade A esophagitis present involving distal esophagus, soft stricture noted at GE junction. Large Hiatal hernia with organoaxial rotation of the stomach. Normal D1 and D2. Procedure was tolerated well by patient.       -Esophageal dilation if needed can be performed prior to extubation. Consider readiness of procedure upon re-assessment tomorrow.   2.  GERD: EGD revealed Grade A esophagitis involving distal esophagus. No evidence of peptic ulcer disease.   -Continue IV protonix BID.     LOS: 1 day    11/20/2020, 10:09 AM   Zeppelin Commisso L. Alver Sorrow, MSN, APRN, AGNP-C [facility]

## 2020-11-20 NOTE — Procedures (Signed)
**Note De-Identified Jaterrius Ricketson Obfuscation** Extubation Procedure Note  Patient Details:   Name: CAREY JOHNDROW DOB: 1933/05/29 MRN: [id number]   Airway Documentation:    Vent end date: 11/20/20 Vent end time: 1435   Evaluation  O2 sats: stable throughout Complications: No apparent complications Patient did tolerate procedure well. Bilateral Breath Sounds: Diminished   No + leak, VS and parameters WNL Masey Scheiber, Penni Bombard 11/20/2020, 2:37 PM

## 2020-11-20 NOTE — Consult Note (Signed)
NAME:  [name], MRN:  [id number], DOB:  06-27-1932, LOS: 1 ADMISSION DATE:  11/19/2020, CONSULTATION DATE:  6/19 REFERRING MD:  Manuella Ghazi, CHIEF COMPLAINT:  resp failure in setting of es obstruction   History of Present Illness:   87 y.o. male with medical history significant for Atria fib on Xarelto, HTN, COPD chronic respiratory failure, dementia and frequent falls who presents to the emergency department via EMS after choking on chicken at home.  Patient was unable to provide history due to being intubated and sedated, history was obtained from ED physician and ED medical record.  Per report, patient was eating chicken when he suddenly started having some gurgling sound, wife checked on him, patient was able to spit up some chicken, but she suspected that patient was choking, so she activated EMS and patient was taken to the ED for further evaluation and management.  There was no report of fever, chills, nausea or vomiting.   ED Course:  In the emergency department, patient was noted to be hypoxic with an O2 sat of 73% on room air, this increased to 88% with facemask oxygen, he was tachycardic and tachypneic and appeared to be in respiratory distress and decision was made for patient to be intubated so as to protect airway in the setting of hypoxia.  Work-up in the ED showed normal CBC and BMP except for BUN at 28 and hyperglycemia.  ABG showed hypercapnia.  Influenza A, B, SARS coronavirus 2 were negative.    Bedside bronchoscopy by ED physician was done and no bronchial obstruction was noted.  A piece of chicken was aspirated in the distal esophagus at bedside by ED physician.  Gastroenterology was consulted due to hiatal hernia in thoracic cavity and will come to ED for bedside EGD per ED physician.  Patient was empirically treated with IV ceftriaxone and metronidazole.  Intubated patient was sedated with propofol.     Significant Hospital Events: Including procedures, antibiotic start and  stop dates in addition to other pertinent events   Et 6/19>>> Bedside fob 6/19 neg for obst EGD  6/19 food dislodged    Scheduled Meds:  atorvastatin  10 mg Oral QHS   chlorhexidine gluconate (MEDLINE KIT)  15 mL Mouth Rinse BID   Chlorhexidine Gluconate Cloth  6 each Topical Daily   mouth rinse  15 mL Mouth Rinse 10 times per day   metoprolol tartrate  2.5 mg Intravenous Q8H   pantoprazole (PROTONIX) IV  40 mg Intravenous Q12H   Continuous Infusions:  cefTRIAXone (ROCEPHIN)  IV 2 g (11/19/20 2022)   feeding supplement (VITAL AF 1.2 CAL)     heparin 1,000 Units/hr (11/20/20 1007)   metronidazole Stopped (11/20/20 5701)   propofol (DIPRIVAN) infusion 20 mcg/kg/min (11/20/20 0757)   PRN Meds:.acetaminophen **OR** acetaminophen, ipratropium, levalbuterol, ondansetron **OR** ondansetron (ZOFRAN) IV  Interim History / Subjective:  Elderly wm off diprovan on wua and doing psv wean/ no excess secretions  Objective   Blood pressure (!) 108/54, pulse 72, temperature 98.96 F (37.2 C), resp. rate (!) 24, height _0  (1.803 m), weight 60.3 kg, SpO2 100 %.    Vent Mode: CPAP;PSV FiO2 (%):  [40 %-100 %] 40 % Set Rate:  [14 bmp-18 bmp] 14 bmp Vt Set:  [350 mL-600 mL] 600 mL PEEP:  [5 cmH20] 5 cmH20 Pressure Support:  [10 cmH20] 10 cmH20 Plateau Pressure:  [9 cmH20-23 cmH20] 15 cmH20   Intake/Output Summary (Last 24 hours) at 11/20/2020 1245 Last data filed at  11/20/2020 0757 Gross per 24 hour  Intake 512.26 ml  Output 290 ml  Net 222.26 ml   Filed Weights   11/19/20 1608  Weight: 60.3 kg    Examination: Elderly wm sedation wearing off, lifting head off pillow some but not f/c  Tmax  102  on Rocephin / zmax since 6/19    HENT: oral et Lungs: distant bs Cardiovascular: RRR no s3/  NSR on monitor Abdomen: soft with og drainage now min brown liquid  Extremities: warm s Cyanosis or club Neuro: sedated        I personally reviewed images and agree with radiology impression  as follows:  CXR:   portable 6/19   Et ok, no as dz   CT chest s contrast  6/19 1. Large hiatal hernia with the entire stomach being intrathoracic. The esophagus is fluid-filled and dilated to the level of the thoracic inlet. No pneumomediastinum. 2. No pneumothorax. 3. Cardiomegaly post CABG. Prominent dilatation of the central pulmonary arteries consistent with pulmonary arterial hypertension. 4. Chronic volume loss in the left lower lobe with occlusion of the left lower lobe bronchus, likely compressive. Chronic left basilar atelectasis. 5. Emphysema.   Assessment & Plan:  1)  Acute resp failure with hypoxemia, hypercarbia and fever suggestive of aspiration pna in setting of es obstruction but no clear as dz present on ct or prior cxr and gas exchange/ mechanics both  look good for ext trial now that es obst has been relieved  >>> rec recheck cxr and If ok go ahead with extubation - note likely had upper airway obst from food but airway has now been cleared  >>> ok with present abx  Understand was previously ncb/ palliative care involved   2) chronic LLL atx secondary to huge HH with restrictive process > academic issue at this point as clearly not operative candidate so no specific pulmonary intervention needed, esp with neg fob 6/19 by EDP      Labs   CBC: Recent Labs  Lab 11/19/20 1546 11/20/20 0440  WBC 8.3 9.6  NEUTROABS 5.3  --   HGB 13.2 12.1*  HCT 44.0 40.4  MCV 88.4 87.6  PLT 214 144*    Basic Metabolic Panel: Recent Labs  Lab 11/19/20 1546 11/20/20 0440  NA 140 142  K 3.9 3.5  CL 103 105  CO2 29 28  GLUCOSE 148* 104*  BUN 28* 31*  CREATININE 0.78 1.04  CALCIUM 9.0 9.0  MG  --  1.7  PHOS  --  1.7*   GFR: Estimated Creatinine Clearance: 43.5 mL/min (by C-G formula based on SCr of 1.04 mg/dL). Recent Labs  Lab 11/19/20 1546 11/20/20 0440  PROCALCITON  --  0.63  WBC 8.3 9.6    Liver Function Tests: Recent Labs  Lab 11/19/20 1546  11/20/20 0440  AST 23 32  ALT 14 17  ALKPHOS 74 59  BILITOT 0.3 0.6  PROT 7.6 6.2*  ALBUMIN 4.4 3.5   No results for input(s): LIPASE, AMYLASE in the last 168 hours. No results for input(s): AMMONIA in the last 168 hours.  ABG    Component Value Date/Time   PHART 7.322 (L) 11/19/2020 1749   PCO2ART 59.6 (H) 11/19/2020 1749   PO2ART 336 (H) 11/19/2020 1749   HCO3 27.3 11/19/2020 1749   TCO2 27 01/11/2016 1614   ACIDBASEDEF 2.0 01/11/2016 1614   O2SAT 99.9 11/19/2020 1749     Coagulation Profile: Recent Labs  Lab 11/20/20 0440  INR 1.5*  Cardiac Enzymes: No results for input(s): CKTOTAL, CKMB, CKMBINDEX, TROPONINI in the last 168 hours.  HbA1C: Hgb A1c MFr Bld  Date/Time Value Ref Range Status  06/26/2007 11:40 AM   Final   6.0 (NOTE)   The ADA recommends the following therapeutic goals for glycemic   control related to Hgb A1C measurement:   Goal of Therapy:   < 7.0% Hgb A1C   Action Suggested:  > 8.0% Hgb A1C   Ref:  Diabetes Care, 66, Suppl. 1, 1999    CBG: Recent Labs  Lab 11/20/20 1202  GLUCAP 109*      Past Medical History:  He,  has a past medical history of Anemia, ASD (atrial septal defect), BPH (benign prostatic hyperplasia), Cerebrovascular accident ([place]) (1990), Chronic bronchitis ([place]), Chronic diarrhea, COPD (chronic obstructive pulmonary disease) ([place]), Coronary artery disease, Depression, Dysphagia, unspecified(787.20), Erosive gastritis, Esophagitis, Falls frequently, Gastric polyp, Gastric ulcer, GERD (gastroesophageal reflux disease), Hearing impairment, Hiatal hernia, History of upper gastrointestinal hemorrhage, Hyperlipidemia, Hypertension, Internal hemorrhoids, LV dysfunction, Permanent atrial fibrillation ([place]), and Vision problems.   Surgical History:   Past Surgical History:  Procedure Laterality Date   ASD REPAIR  1983   CARDIOVERSION     possibly 3 times, dr. brody/cooper   CHOLECYSTECTOMY  1990s   ENTEROSCOPY  05/13/2012    Procedure: ENTEROSCOPY;  Surgeon: Inda Castle, MD;  Location: [facility] ENDOSCOPY;  Service: Endoscopy;  Laterality: N/A;   INGUINAL HERNIA REPAIR  1963   LEFT HEART CATHETERIZATION WITH CORONARY ANGIOGRAM N/A 09/06/2014   Procedure: LEFT HEART CATHETERIZATION WITH CORONARY ANGIOGRAM;  Surgeon: Jettie Booze, MD;  Location: [facility] CATH LAB;  Service: Cardiovascular;  Laterality: N/A;   partial small bowel resection  2005   ischemic?     Social History:   reports that he quit smoking about 37 years ago. His smoking use included cigarettes. He has never used smokeless tobacco. He reports that he does not drink alcohol and does not use drugs.   Family History:  His family history includes Heart disease in his father and mother; Prostate cancer in his brother.   Allergies Allergies  Allergen Reactions   Lovenox [Enoxaparin Sodium] Swelling   Penicillins Hives    Has patient had a PCN reaction causing immediate rash, facial/tongue/throat swelling, SOB or lightheadedness with hypotension: Yes Has patient had a PCN reaction causing severe rash involving mucus membranes or skin necrosis: No Has patient had a PCN reaction that required hospitalization: Unknown Has patient had a PCN reaction occurring within the last 10 years: No If all of the above answers are "NO", then may proceed with Cephalosporin use.    Tape Other (See Comments)    SKIN WILL TEAR IF TAPED (MUST BE LIGHT & BRIEF OR USE COBAN WRAP, PLEASE!!)     Home Medications  Prior to Admission medications   Medication Sig Start Date End Date Taking? Authorizing Provider  buPROPion (WELLBUTRIN XL) 150 MG 24 hr tablet TAKE 1 TABLET EVERY DAY Patient taking differently: Take 150 mg by mouth daily. 10/05/20  Yes Burchette, Alinda Sierras, MD  diltiazem (CARDIZEM CD) 180 MG 24 hr capsule TAKE 1 CAPSULE ONCE DAILY Patient taking differently: Take 180 mg by mouth daily. 10/27/20  Yes Burchette, Alinda Sierras, MD  dutasteride (AVODART) 0.5 MG capsule TAKE  (1) CAPSULE DAILY Patient taking differently: Take 0.5 mg by mouth daily. TAKE (1) CAPSULE DAILY 11/09/20  Yes Burchette, Alinda Sierras, MD  furosemide (LASIX) 20 MG tablet Take 1 tablet (20 mg total)  by mouth as needed. For edema 11/17/19  Yes Burchette, Alinda Sierras, MD  ipratropium (ATROVENT) 0.02 % nebulizer solution Nebulize one vial every 6 hours as needed. Patient taking differently: Take 0.5 mg by nebulization every 6 (six) hours as needed for wheezing or shortness of breath. Nebulize one vial every 6 hours as needed. 08/02/20  Yes Burchette, Alinda Sierras, MD  isosorbide mononitrate (IMDUR) 30 MG 24 hr tablet TAKE 1 TABLET DAILY Patient taking differently: Take 30 mg by mouth daily. 11/09/20  Yes Burchette, Alinda Sierras, MD  levalbuterol (XOPENEX) 0.63 MG/3ML nebulizer solution NEBULIZE 1 VIAL (WITH IPRATROPIUM) TWICE A DAY AS NEEDED. MAY USE EXTR A EVERY 4 HOURS IF NEEDED Patient taking differently: Take 0.63 mg by nebulization 2 (two) times daily as needed for wheezing or shortness of breath. NEBULIZE 1 VIAL (WITH IPRATROPIUM) TWICE A DAY AS NEEDED. MAY USE EXTR A EVERY 4 HOURS IF NEEDED 07/10/20  Yes Burchette, Alinda Sierras, MD  mirtazapine (REMERON) 15 MG tablet TAKE 1 TABLET DAILY Patient taking differently: Take 15 mg by mouth at bedtime. 09/20/20  Yes Burchette, Alinda Sierras, MD  nitroGLYCERIN (NITROSTAT) 0.4 MG SL tablet Place 1 tablet (0.4 mg total) under the tongue every 5 (five) minutes x 3 doses as needed for chest pain. 08/20/14  Yes Theora Gianotti, NP  pantoprazole (PROTONIX) 40 MG tablet Take 1 tablet (40 mg total) by mouth daily. 11/11/19  Yes Esterwood, Amy S, PA-C  potassium chloride (KLOR-CON 10) 10 MEQ tablet Take two tablets once daily on days taking Furosemide. Patient taking differently: Take 20 mEq by mouth daily as needed (supplement with Furosemide). Take two tablets once daily on days taking Furosemide. 11/17/19  Yes Burchette, Alinda Sierras, MD  saccharomyces boulardii (FLORASTOR) 250 MG capsule Take 250  mg by mouth 2 (two) times daily.   Yes [provider]  simvastatin (ZOCOR) 20 MG tablet TAKE 1 TABLET AT BEDTIME Patient taking differently: Take 20 mg by mouth at bedtime. 10/31/20  Yes Burchette, Alinda Sierras, MD  vitamin B-12 (CYANOCOBALAMIN) 500 MCG tablet Take 500 mcg by mouth daily.   Yes [provider]  XARELTO 20 MG TABS tablet TAKE 1 TABLET DAILY WITH SUPPER Patient taking differently: Take 20 mg by mouth daily with supper. 09/04/20  Yes Burchette, Alinda Sierras, MD  clotrimazole-betamethasone (LOTRISONE) cream APPLY TO AFFECTED AREAS TWICE A DAY Patient not taking: No sig reported 06/26/16   [name], MD     The patient is critically ill with multiple organ systems failure and requires high complexity decision making for assessment and support, frequent evaluation and titration of therapies, application of advanced monitoring technologies and extensive interpretation of multiple databases. Critical Care Time devoted to patient care services described in this note is 45 minutes.    Christinia Gully, MD Pulmonary and [place] [phone]   After 7:00 pm call Elink  [phone]

## 2020-11-20 NOTE — Progress Notes (Signed)
Cold rags applied to patient since fever is not responding to rectal Tylenol.

## 2020-11-20 NOTE — Consult Note (Signed)
Consultation Note Date: 11/20/2020   Patient Name: [name]  DOB: 1932-08-15  MRN: [id number]  Age / Sex: 87 y.o., male  PCP: [name], MD Referring Physician: Rodena Goldmann, DO  Reason for Consultation: Establishing goals of care  HPI/Patient Profile: 87 y.o. male  with past medical history of dementia, frequent falls, HTN/A. fib on Xarelto, COPD with chronic respiratory failure admitted on 11/19/2020 with acute on chronic respiratory failure secondary to aspiration.   Clinical Assessment and Goals of Care: I have reviewed medical records including EPIC notes, labs and imaging, received report from RN, assessed the patient.  [name] is lying quietly in bed.  He is intubated/ventilated and sedated.  He appears acutely/chronically ill and very frail.  There is no family at bedside at this time.    Call to wife, [name].  No answer, unable to leave voicemail.  Conference with attending.  Additional call to wife, [name] to discuss diagnosis prognosis, [place], EOL wishes, disposition and options.   I introduced Palliative Medicine as specialized medical care for people living with serious illness. It focuses on providing relief from the symptoms and stress of a serious illness. The goal is to improve quality of life for both the patient and the family.  [name] states that [name] has been declining for quite some time.  We briefly talk about home health services, long-term care, short-term rehab.  [name] endorses that she does not believe her husband could participate in short-term rehab.  I ask if she would be surprised if he qualified for hospice.  She tells me that she would not.  We plan for a family meeting tomorrow 6/21 at 10 AM.  I attempted to elicit values and goals of care important to the patient.    Advanced directives, concepts specific to code status, were  discussed by attending.  [name] elects DNR.  Discussed the importance of continued conversation with family and the medical providers regarding overall plan of care and treatment options, ensuring decisions are within the context of the patient's values and GOCs.    Questions and concerns were addressed.  The family was encouraged to call with questions or concerns.  PMT will continue to support holistically.  Conference with attending, bedside nursing staff, transition of care team related to patient condition, needs, goals of care.   HCPOA   NEXT OF KIN -wife, [name]    SUMMARY OF RECOMMENDATIONS   At this point continue to treat the treatable but no CPR or intubation Considering disposition Brief discussion of hospice care    Code Status/Advance Care Planning: DNR  Symptom Management:  Per hospitalist, no additional needs at this time.  Palliative Prophylaxis:  Frequent Pain Assessment, Oral Care, and Turn Reposition  Additional Recommendations (Limitations, Scope, Preferences): At this point continue to treat the treatable considering hospice care  Psycho-social/Spiritual:  Desire for further Chaplaincy support:yes Additional Recommendations: Caregiving  Support/Resources and Education on Hospice  Prognosis:  Unable to determine, 3 months  or less would not be surprising based on functional status, advancing dementia, aspiration risk  Discharge Planning:  To be determined, based on outcomes.       Primary Diagnoses: Present on Admission:  Aspiration pneumonia ([place])  Acute on chronic respiratory failure with hypoxia and hypercapnia (HCC)  Atrial fibrillation with rapid ventricular response (HCC)  Hyperglycemia  Dementia (HCC)  Essential hypertension  COPD (chronic obstructive pulmonary disease) (HCC)  GERD  Hyperlipidemia   I have reviewed the medical record, interviewed the patient and family, and examined the patient. The following aspects are  pertinent.  Past Medical History:  Diagnosis Date   Anemia    ASD (atrial septal defect)    a. s/p repair 1983.   BPH (benign prostatic hyperplasia)    Cerebrovascular accident ([place]) 1990   Chronic bronchitis ([place])    a. h/o resp failure in setting of bronchitis vs PNA 05/2012.   Chronic diarrhea    COPD (chronic obstructive pulmonary disease) (HCC)    Coronary artery disease    a. Nonobstructive by cath 2009 and 09/06/14   Depression    Dysphagia, unspecified(787.20)    Erosive gastritis    H/o GIB felt secondary to this   Esophagitis    Falls frequently    Gastric polyp    Gastric ulcer    GERD (gastroesophageal reflux disease)    Hearing impairment    Hiatal hernia    History of upper gastrointestinal hemorrhage    dr. Maurene Capes, GI   Hyperlipidemia    Hypertension    Internal hemorrhoids    LV dysfunction    a. EF 45-50% by echo 05/2012.   Permanent atrial fibrillation (HCC)    [facility]R [facility].   Vision problems    Social History   Socioeconomic History   Marital status: Married    Spouse name: Office manager   Number of children: 1   Years of education: Not on file   Highest education level: Not on file  Occupational History   Not on file  Tobacco Use   Smoking status: Former    Packs/day: 0.00    Years: 0.00    Pack years: 0.00    Types: Cigarettes    Quit date: 06/13/1983    Years since quitting: 37.4   Smokeless tobacco: Never  Vaping Use   Vaping Use: Never used  Substance and Sexual Activity   Alcohol use: No   Drug use: No   Sexual activity: Not on file  Other Topics Concern   Not on file  Social History Narrative   Lives in [place] with wife.  Several falls recently, 4 in the last month.        [name]  [phone]               Social Determinants of Health   Financial Resource Strain: Not on file  Food Insecurity: Not on file  Transportation Needs: Not on file  Physical Activity: Not on file  Stress: Not on file  Social  Connections: Not on file   Family History  Problem Relation Age of Onset   Heart disease Mother    Heart disease Father    Prostate cancer Brother    Scheduled Meds:  atorvastatin  10 mg Oral QHS   chlorhexidine gluconate (MEDLINE KIT)  15 mL Mouth Rinse BID   Chlorhexidine Gluconate Cloth  6 each Topical Daily   mouth rinse  15 mL Mouth Rinse 10 times per day   metoprolol  tartrate  2.5 mg Intravenous Q8H   pantoprazole (PROTONIX) IV  40 mg Intravenous Q12H   Continuous Infusions:  cefTRIAXone (ROCEPHIN)  IV 2 g (11/19/20 2022)   feeding supplement (VITAL AF 1.2 CAL) Stopped (11/20/20 1312)   heparin 1,000 Units/hr (11/20/20 1007)   metronidazole Stopped (11/20/20 0614)   PRN Meds:.acetaminophen **OR** acetaminophen, ipratropium, levalbuterol, ondansetron **OR** ondansetron (ZOFRAN) IV Medications Prior to Admission:  Prior to Admission medications   Medication Sig Start Date End Date Taking? Authorizing Provider  buPROPion (WELLBUTRIN XL) 150 MG 24 hr tablet TAKE 1 TABLET EVERY DAY Patient taking differently: Take 150 mg by mouth daily. 10/05/20  Yes Burchette, Alinda Sierras, MD  diltiazem (CARDIZEM CD) 180 MG 24 hr capsule TAKE 1 CAPSULE ONCE DAILY Patient taking differently: Take 180 mg by mouth daily. 10/27/20  Yes Burchette, Alinda Sierras, MD  dutasteride (AVODART) 0.5 MG capsule TAKE (1) CAPSULE DAILY Patient taking differently: Take 0.5 mg by mouth daily. TAKE (1) CAPSULE DAILY 11/09/20  Yes Burchette, Alinda Sierras, MD  furosemide (LASIX) 20 MG tablet Take 1 tablet (20 mg total) by mouth as needed. For edema 11/17/19  Yes Burchette, Alinda Sierras, MD  ipratropium (ATROVENT) 0.02 % nebulizer solution Nebulize one vial every 6 hours as needed. Patient taking differently: Take 0.5 mg by nebulization every 6 (six) hours as needed for wheezing or shortness of breath. Nebulize one vial every 6 hours as needed. 08/02/20  Yes Burchette, Alinda Sierras, MD  isosorbide mononitrate (IMDUR) 30 MG 24 hr tablet TAKE 1 TABLET  DAILY Patient taking differently: Take 30 mg by mouth daily. 11/09/20  Yes Burchette, Alinda Sierras, MD  levalbuterol (XOPENEX) 0.63 MG/3ML nebulizer solution NEBULIZE 1 VIAL (WITH IPRATROPIUM) TWICE A DAY AS NEEDED. MAY USE EXTR A EVERY 4 HOURS IF NEEDED Patient taking differently: Take 0.63 mg by nebulization 2 (two) times daily as needed for wheezing or shortness of breath. NEBULIZE 1 VIAL (WITH IPRATROPIUM) TWICE A DAY AS NEEDED. MAY USE EXTR A EVERY 4 HOURS IF NEEDED 07/10/20  Yes Burchette, Alinda Sierras, MD  mirtazapine (REMERON) 15 MG tablet TAKE 1 TABLET DAILY Patient taking differently: Take 15 mg by mouth at bedtime. 09/20/20  Yes Burchette, Alinda Sierras, MD  nitroGLYCERIN (NITROSTAT) 0.4 MG SL tablet Place 1 tablet (0.4 mg total) under the tongue every 5 (five) minutes x 3 doses as needed for chest pain. 08/20/14  Yes Theora Gianotti, NP  pantoprazole (PROTONIX) 40 MG tablet Take 1 tablet (40 mg total) by mouth daily. 11/11/19  Yes Esterwood, Amy S, PA-C  potassium chloride (KLOR-CON 10) 10 MEQ tablet Take two tablets once daily on days taking Furosemide. Patient taking differently: Take 20 mEq by mouth daily as needed (supplement with Furosemide). Take two tablets once daily on days taking Furosemide. 11/17/19  Yes Burchette, Alinda Sierras, MD  saccharomyces boulardii (FLORASTOR) 250 MG capsule Take 250 mg by mouth 2 (two) times daily.   Yes [provider]  simvastatin (ZOCOR) 20 MG tablet TAKE 1 TABLET AT BEDTIME Patient taking differently: Take 20 mg by mouth at bedtime. 10/31/20  Yes Burchette, Alinda Sierras, MD  vitamin B-12 (CYANOCOBALAMIN) 500 MCG tablet Take 500 mcg by mouth daily.   Yes [provider]  XARELTO 20 MG TABS tablet TAKE 1 TABLET DAILY WITH SUPPER Patient taking differently: Take 20 mg by mouth daily with supper. 09/04/20  Yes Burchette, Alinda Sierras, MD  clotrimazole-betamethasone (LOTRISONE) cream APPLY TO AFFECTED AREAS TWICE A DAY Patient not taking: No sig reported  06/26/16    Burchette, Alinda Sierras, MD   Allergies  Allergen Reactions   Lovenox [Enoxaparin Sodium] Swelling   Penicillins Hives    Has patient had a PCN reaction causing immediate rash, facial/tongue/throat swelling, SOB or lightheadedness with hypotension: Yes Has patient had a PCN reaction causing severe rash involving mucus membranes or skin necrosis: No Has patient had a PCN reaction that required hospitalization: Unknown Has patient had a PCN reaction occurring within the last 10 years: No If all of the above answers are "NO", then may proceed with Cephalosporin use.    Tape Other (See Comments)    SKIN WILL TEAR IF TAPED (MUST BE LIGHT & BRIEF OR USE COBAN WRAP, PLEASE!!)   Review of Systems  Unable to perform ROS: Intubated   Physical Exam Vitals and nursing note reviewed.  Constitutional:      General: He is not in acute distress.    Appearance: He is ill-appearing.  HENT:     Head:     Comments: Temporal wasting Cardiovascular:     Rate and Rhythm: Normal rate.  Pulmonary:     Comments: Intubated/ventilated Abdominal:     General: Abdomen is flat. There is no distension.  Musculoskeletal:        General: No swelling.  Skin:    General: Skin is warm and dry.  Neurological:     Comments: Intubated/ventilated, known dementia    Vital Signs: BP (!) 108/54   Pulse 72   Temp 98.96 F (37.2 C)   Resp (!) 24   Ht 5' 11"  (1.803 m)   Wt 60.3 kg   SpO2 100%   BMI 18.54 kg/m  Pain Scale: CPOT   Pain Score: Asleep   SpO2: SpO2: 100 % O2 Device:SpO2: 100 % O2 Flow Rate: .O2 Flow Rate (L/min): 70 L/min  IO: Intake/output summary:  Intake/Output Summary (Last 24 hours) at 11/20/2020 1431 Last data filed at 11/20/2020 0757 Gross per 24 hour  Intake 512.26 ml  Output 290 ml  Net 222.26 ml    LBM:   Baseline Weight: Weight: 60.3 kg Most recent weight: Weight: 60.3 kg     Palliative Assessment/Data:   Flowsheet Rows    Flowsheet Row Most Recent Value  Intake Tab    Referral Department Hospitalist  Unit at Time of Referral ICU  Palliative Care Primary Diagnosis Pulmonary  Date Notified 11/20/20  Palliative Care Type Return patient Palliative Care  Reason for referral Clarify Goals of Care  Date of Admission 11/19/20  Date first seen by Palliative Care 11/20/20  # of days Palliative referral response time 0 Day(s)  # of days IP prior to Palliative referral 1  Clinical Assessment   Palliative Performance Scale Score 10%  Pain Max last 24 hours Not able to report  Pain Min Last 24 hours Not able to report  Dyspnea Max Last 24 Hours Not able to report  Dyspnea Min Last 24 hours Not able to report  Psychosocial & Spiritual Assessment   Palliative Care Outcomes        Time In: 1020 Time Out: 1130 Time Total: 70 minutes  Greater than 50%  of this time was spent counseling and coordinating care related to the above assessment and plan.  Signed by: Drue Novel, NP   Please contact Palliative Medicine Team phone at [phone] for questions and concerns.  For individual provider: See Shea Evans

## 2020-11-20 NOTE — Progress Notes (Signed)
ANTICOAGULATION CONSULT NOTE -   Pharmacy Consult for Heparin  Indication: atrial fibrillation, history of PE  Allergies  Allergen Reactions   Lovenox [Enoxaparin Sodium] Swelling   Penicillins Hives    Has patient had a PCN reaction causing immediate rash, facial/tongue/throat swelling, SOB or lightheadedness with hypotension: Yes Has patient had a PCN reaction causing severe rash involving mucus membranes or skin necrosis: No Has patient had a PCN reaction that required hospitalization: Unknown Has patient had a PCN reaction occurring within the last 10 years: No If all of the above answers are "NO", then may proceed with Cephalosporin use.    Tape Other (See Comments)    SKIN WILL TEAR IF TAPED (MUST BE LIGHT & BRIEF OR USE COBAN WRAP, PLEASE!!)    Patient Measurements: Height: 5\' 11"  (180.3 cm) Weight: 60.3 kg (132 lb 15 oz) IBW/kg (Calculated) : 75.3  Vital Signs: Temp: 99.5 F (37.5 C) (06/20 0700) Temp Source: Bladder (06/20 0000) BP: 109/52 (06/20 0700) Pulse Rate: 62 (06/20 0700)  Labs: Recent Labs    11/19/20 1546 11/20/20 0440 11/20/20 0824  HGB 13.2 12.1*  --   HCT 44.0 40.4  --   PLT 214 144*  --   APTT  --   --  59*  LABPROT  --  18.4*  --   INR  --  1.5*  --   HEPARINUNFRC  --   --  >1.10*  CREATININE 0.78 1.04  --      Estimated Creatinine Clearance: 43.5 mL/min (by C-G formula based on SCr of 1.04 mg/dL).   Medical History: Past Medical History:  Diagnosis Date   Anemia    ASD (atrial septal defect)    a. s/p repair 1983.   BPH (benign prostatic hyperplasia)    Cerebrovascular accident ([place]) 1990   Chronic bronchitis ([place])    a. h/o resp failure in setting of bronchitis vs PNA 05/2012.   Chronic diarrhea    COPD (chronic obstructive pulmonary disease) (HCC)    Coronary artery disease    a. Nonobstructive by cath 2009 and 09/06/14   Depression    Dysphagia, unspecified(787.20)    Erosive gastritis    H/o GIB felt secondary to this    Esophagitis    Falls frequently    Gastric polyp    Gastric ulcer    GERD (gastroesophageal reflux disease)    Hearing impairment    Hiatal hernia    History of upper gastrointestinal hemorrhage    dr. Maurene Capes, GI   Hyperlipidemia    Hypertension    Internal hemorrhoids    LV dysfunction    a. EF 45-50% by echo 05/2012.   Permanent atrial fibrillation (HCC)    INR followed at [facility].   Vision problems      Assessment: 87 y/o M with esophageal obstruction after eating chicken, required intubation in the ED, on Xarelto PTA for afib, holding Xarelto and starting heparin while intubated. It has been greater that 24 hours since last Xarelto dose (6/18 at ~1700), ok to start heparin now. Anticipate using aPTT to dose for now given Xarelto influence on anti-Xa levels. CBC/renal function good. Noted Lovenox allergy on profile, patient has tolerated heparin drip in the past when he had a PE.   APTT 59- subtherapeutic HL >1.10- still high due to previous Xarelto dose.  Goal of Therapy:  Heparin level 0.3-0.7 units/ml aPTT 66-102 seconds Monitor platelets by anticoagulation protocol: Yes   Plan:  Increase heparin infusion to 1000 units/hr  APTT in 8 hours and APTT/Heparin level daily Monitor for bleeding  Margot Ables, PharmD Clinical Pharmacist 11/20/2020 9:21 AM

## 2020-11-20 NOTE — Plan of Care (Signed)
  Problem: Education: Goal: Knowledge of General Education information will improve Description: Including pain rating scale, medication(s)/side effects and non-pharmacologic comfort measures Outcome: Progressing   Problem: Clinical Measurements: Goal: Ability to maintain clinical measurements within normal limits will improve Outcome: Progressing   Problem: Safety: Goal: Ability to remain free from injury will improve Outcome: Progressing   

## 2020-11-20 NOTE — Progress Notes (Addendum)
Initial Nutrition Assessment  DOCUMENTATION CODES:  Severe malnutrition in context of chronic illness  INTERVENTION:  Place small bore NGT and start TF.  Initiate tube feeding via small bore NGT: Vital 1.5 at 20 ml/h and increase by 10 ml every 8 hrs to goal rate of 50 ml/h (1200 ml per day)  Provides 1800 kcal, 81 gm protein, 912 ml free water daily.  Water flushes per CCM.  Monitor magnesium, potassium, and phosphorus daily for at least 3 days, MD to replete as needed, as pt is at risk for refeeding syndrome.  NUTRITION DIAGNOSIS:  Severe Malnutrition related to chronic illness as evidenced by severe fat depletion, severe muscle depletion.  GOAL:  Patient will meet greater than or equal to 90% of their needs  MONITOR:  Vent status, Labs, Weight trends, TF tolerance, I & O's, Diet advancement  REASON FOR ASSESSMENT:  Low Braden, Ventilator    ASSESSMENT:  87 yo male with a PMH of A-fib, HTN, COPD, chronic respiratory failure, dementia, and frequent falls who presents with aspiration PNA. 6/19 - EGD to remove pieces of chicken that pt came I choking on; venting NG/OG placed  Patient is currently intubated on ventilator support MV: 9.3 L/min Temp (24hrs), Avg:100.4 F (38 C), Min:95.72 F (35.4 C), Max:102 F (38.9 C)  Propofol: 6.7 ml/hr - provides 177 kcal/day  Spoke with pt's RN. RN to try to wean propofol today, as pt is starting to wake up a bit, but RN suspects it may not go well. Reports that it is unlikely he will extubate today as well.  Given that pt is severely malnourished, which is evident in his physical exam, recommend starting tube feeding as soon as possible. Secure-chatted MD and RN regarding recommendations.  Pt is a high refeeding risk. Monitor magnesium, potassium, and phosphorus daily for at least 3 days, MD to replete as needed, as pt is at risk for refeeding syndrome given severe malnutrition.   Per Epic, pt has lost ~5.5 lbs (4.1%) in the past  year. Pt's weight has not changed over the past 3 months. RD suspects weight has been copied from previous admission.  Medications: reviewed; Protonix, ceftriaxone via IV, Flagyl via IV TID, Propofol  Labs: reviewed; Glucose 104 (H), Phos 1.7 (L)  NUTRITION - FOCUSED PHYSICAL EXAM: Flowsheet Row Most Recent Value  Orbital Region Severe depletion  Upper Arm Region Severe depletion  Thoracic and Lumbar Region Severe depletion  Buccal Region Unable to assess  Temple Region Unable to assess  Clavicle Bone Region Severe depletion  Clavicle and Acromion Bone Region Severe depletion  Scapular Bone Region Unable to assess  Dorsal Hand Unable to assess  [[place]]  Patellar Region Severe depletion  Anterior Thigh Region Severe depletion  Posterior Calf Region Severe depletion  Edema (RD Assessment) None  Hair Reviewed  Eyes Unable to assess  Mouth Unable to assess  Skin Reviewed  Nails Unable to assess   Diet Order:   Diet Order             Diet NPO time specified  Diet effective now                  EDUCATION NEEDS:  No education needs have been identified at this time  Skin:  Skin Assessment: Reviewed RN Assessment  Last BM:  PTA/unknown  Height:  Ht Readings from Last 1 Encounters:  11/19/20 5\' 11"  (1.803 m)   Weight:  Wt Readings from Last 1 Encounters:  11/19/20 60.3 kg  Ideal Body Weight:  78.2 kg  BMI:  Body mass index is 18.54 kg/m.  Estimated Nutritional Needs:  Kcal:  1680 Protein:  70-85 grams Fluid:  1.6 L  Derrel Nip, RD, LDN Registered Dietitian I After-Hours/Weekend Pager # in [place]

## 2020-11-20 NOTE — Progress Notes (Signed)
ANTICOAGULATION CONSULT NOTE - Follow Up Consult  Pharmacy Consult for heparin Indication: atrial fibrillation  Labs: Recent Labs    11/19/20 1546 11/20/20 0440 11/20/20 0824 11/20/20 1812  HGB 13.2 12.1*  --   --   HCT 44.0 40.4  --   --   PLT 214 144*  --   --   APTT  --   --  59* 118*  LABPROT  --  18.4*  --   --   INR  --  1.5*  --   --   HEPARINUNFRC  --   --  >1.10*  --   CREATININE 0.78 1.04  --   --      Assessment: 87yo male supratherapeutic on heparin after rate increase; RN reports some oozing around IV site but notes that pt is confused and moving around quite a bit, which could be causing some minor trauma to the IV site.  Goal of Therapy:  aPTT 66-102 seconds   Plan:  Will decrease heparin gtt by 2 units/kg/hr to 850 units/hr and check PTT in 8 hours.    Wynona Neat, PharmD, BCPS  11/20/2020,8:22 PM

## 2020-11-20 NOTE — Progress Notes (Signed)
Pt transported to CT and back without complications. 

## 2020-11-21 DIAGNOSIS — F0390 Unspecified dementia without behavioral disturbance: Secondary | ICD-10-CM

## 2020-11-21 DIAGNOSIS — K222 Esophageal obstruction: Secondary | ICD-10-CM

## 2020-11-21 LAB — CBC
HCT: 37.3 % — ABNORMAL LOW (ref 39.0–52.0)
Hemoglobin: 11.2 g/dL — ABNORMAL LOW (ref 13.0–17.0)
MCH: 26.2 pg (ref 26.0–34.0)
MCHC: 30 g/dL (ref 30.0–36.0)
MCV: 87.4 fL (ref 80.0–100.0)
Platelets: 141 K/uL — ABNORMAL LOW (ref 150–400)
RBC: 4.27 MIL/uL (ref 4.22–5.81)
RDW: 16 % — ABNORMAL HIGH (ref 11.5–15.5)
WBC: 10.6 K/uL — ABNORMAL HIGH (ref 4.0–10.5)
nRBC: 0 % (ref 0.0–0.2)

## 2020-11-21 LAB — BASIC METABOLIC PANEL WITH GFR
Anion gap: 9 (ref 5–15)
BUN: 34 mg/dL — ABNORMAL HIGH (ref 8–23)
CO2: 25 mmol/L (ref 22–32)
Calcium: 8.6 mg/dL — ABNORMAL LOW (ref 8.9–10.3)
Chloride: 109 mmol/L (ref 98–111)
Creatinine, Ser: 0.95 mg/dL (ref 0.61–1.24)
GFR, Estimated: >60 mL/min
Glucose, Bld: 84 mg/dL (ref 70–99)
Potassium: 2.9 mmol/L — ABNORMAL LOW (ref 3.5–5.1)
Sodium: 143 mmol/L (ref 135–145)

## 2020-11-21 LAB — HEPARIN LEVEL (UNFRACTIONATED): Heparin Unfractionated: 0.61 IU/mL (ref 0.30–0.70)

## 2020-11-21 LAB — GLUCOSE, CAPILLARY
Glucose-Capillary: 86 mg/dL (ref 70–99)
Glucose-Capillary: 79 mg/dL (ref 70–99)
Glucose-Capillary: 75 mg/dL (ref 70–99)
Glucose-Capillary: 100 mg/dL — ABNORMAL HIGH (ref 70–99)

## 2020-11-21 LAB — MAGNESIUM
Magnesium: 1.6 mg/dL — ABNORMAL LOW (ref 1.7–2.4)
Magnesium: 2.4 mg/dL (ref 1.7–2.4)

## 2020-11-21 LAB — TRIGLYCERIDES: Triglycerides: 86 mg/dL

## 2020-11-21 LAB — PHOSPHORUS
Phosphorus: 2.5 mg/dL (ref 2.5–4.6)
Phosphorus: 2.2 mg/dL — ABNORMAL LOW (ref 2.5–4.6)

## 2020-11-21 LAB — APTT: aPTT: 63 seconds — ABNORMAL HIGH (ref 24–36)

## 2020-11-21 MED ORDER — POTASSIUM CHLORIDE 10 MEQ/100ML IV SOLN
10.0000 meq | INTRAVENOUS | Status: AC
  Administered 2020-11-21 (×4): 10 meq via INTRAVENOUS
  Filled 2020-11-21 (×4): qty 100

## 2020-11-21 MED ORDER — MAGNESIUM SULFATE 2 GM/50ML IV SOLN
2.0000 g | Freq: Once | INTRAVENOUS | Status: AC
  Administered 2020-11-21: 2 g via INTRAVENOUS
  Filled 2020-11-21: qty 50

## 2020-11-21 MED ORDER — HYDRALAZINE HCL 20 MG/ML IJ SOLN: 10.0000 mg | INTRAMUSCULAR | Status: DC | PRN

## 2020-11-21 NOTE — Evaluation (Signed)
Clinical/Bedside Swallow Evaluation Patient Details  Name: [name] MRN: [id number] Date of Birth: Sep 26, 1932  Today's Date: 11/21/2020 Time: SLP Start Time (ACUTE ONLY): 50 SLP Stop Time (ACUTE ONLY): 1151 SLP Time Calculation (min) (ACUTE ONLY): 28 min  Past Medical History:  Past Medical History:  Diagnosis Date   Anemia    ASD (atrial septal defect)    a. s/p repair 1983.   BPH (benign prostatic hyperplasia)    Cerebrovascular accident ([place]) 1990   Chronic bronchitis ([place])    a. h/o resp failure in setting of bronchitis vs PNA 05/2012.   Chronic diarrhea    COPD (chronic obstructive pulmonary disease) (HCC)    Coronary artery disease    a. Nonobstructive by cath 2009 and 09/06/14   Depression    Dysphagia, unspecified(787.20)    Erosive gastritis    H/o GIB felt secondary to this   Esophagitis    Falls frequently    Gastric polyp    Gastric ulcer    GERD (gastroesophageal reflux disease)    Hearing impairment    Hiatal hernia    History of upper gastrointestinal hemorrhage    dr. Maurene Capes, GI   Hyperlipidemia    Hypertension    Internal hemorrhoids    LV dysfunction    a. EF 45-50% by echo 05/2012.   Permanent atrial fibrillation (HCC)    INR followed at [facility].   Vision problems    Past Surgical History:  Past Surgical History:  Procedure Laterality Date   ASD REPAIR  1983   CARDIOVERSION     possibly 3 times, dr. brody/cooper   CHOLECYSTECTOMY  1990s   ENTEROSCOPY  05/13/2012   Procedure: ENTEROSCOPY;  Surgeon: Inda Castle, MD;  Location: [facility] ENDOSCOPY;  Service: Endoscopy;  Laterality: N/A;   INGUINAL HERNIA REPAIR  1963   LEFT HEART CATHETERIZATION WITH CORONARY ANGIOGRAM N/A 09/06/2014   Procedure: LEFT HEART CATHETERIZATION WITH CORONARY ANGIOGRAM;  Surgeon: Jettie Booze, MD;  Location: [facility] CATH LAB;  Service: Cardiovascular;  Laterality: N/A;   partial small bowel resection  2005   ischemic?   HPI:  [name] is an 87 y.o. male with  medical history significant for Atria fib on Xarelto, HTN, COPD chronic respiratory failure, dementia and frequent falls who presents to the emergency department via EMS after choking on chicken at home.  He was admitted with acute on chronic hypoxemic and hypercapnic respiratory failure secondary to presumed aspiration pneumonia and had to urgently undergo EGD due to esophageal obstruction with pieces of chicken.  He was intubated for airway protection and has subsequently been extubated on 6/20.   Assessment / Plan / Recommendation Clinical Impression  Clinical swallowing evaluation completed while Pt was sitting upright in bed; Pt was responsive and pleasant. Overt s/sx of aspiration with thin liquids noted including immediate and delayed congested coughing and wet vocal quality after cup and straw sips of thin liquids. Pt consumed NTL without s/sx of airway compromise. Pt consumed puree textures with mild oral residue but without overt s/sx of aspiration. Recommend initiate D1/NTL diet and meds to be crushed in puree. Further recommend complete instrumental assessment to objectively assess the swallowing function. Proceed with MBSS to be completed 11/22/20. SLP reviewed recommendations with MD, RN and Pt's wife. All in agreement with POC. SLP Visit Diagnosis: Dysphagia, unspecified (R13.10)    Aspiration Risk  Mild aspiration risk;Moderate aspiration risk    Diet Recommendation Dysphagia 1 (Puree);Nectar-thick liquid   Liquid Administration via: Cup  Medication Administration: Crushed with puree Supervision: Full supervision/cueing for compensatory strategies;Staff to assist with self feeding Compensations: Minimize environmental distractions;Slow rate;Small sips/bites;Clear throat intermittently;Follow solids with liquid Postural Changes: Seated upright at 90 degrees    Other  Recommendations Oral Care Recommendations: Oral care BID Other Recommendations: Order thickener from pharmacy;Clarify  dietary restrictions;Remove water pitcher;Prohibited food (jello, ice cream, thin soups)   Follow up Recommendations Skilled Nursing facility;24 hour supervision/assistance      Frequency and Duration min 2x/week  1 week       Prognosis Prognosis for Safe Diet Advancement: [place] Date of Onset: 11/19/20 HPI: [name] is an 87 y.o. male with medical history significant for Atria fib on Xarelto, HTN, COPD chronic respiratory failure, dementia and frequent falls who presents to the emergency department via EMS after choking on chicken at home.  He was admitted with acute on chronic hypoxemic and hypercapnic respiratory failure secondary to presumed aspiration pneumonia and had to urgently undergo EGD due to esophageal obstruction with pieces of chicken.  He was intubated for airway protection and has subsequently been extubated on 6/20. Type of Study: Bedside Swallow Evaluation Previous Swallow Assessment: BSE 2017 Diet Prior to this Study: NPO Temperature Spikes Noted: No Respiratory Status: Room air History of Recent Intubation: Yes Length of Intubations (days): 1 days Date extubated: 11/20/20 Behavior/Cognition: Alert;Cooperative;Pleasant mood Oral Cavity Assessment: Dry Oral Care Completed by SLP: Recent completion by staff Oral Cavity - Dentition: Edentulous (Dentures not present) Self-Feeding Abilities: Needs assist;Total assist Patient Positioning: Upright in bed Baseline Vocal Quality: Normal Volitional Cough: Weak;Congested Volitional Swallow: Unable to elicit    Oral/Motor/Sensory Function Overall Oral Motor/Sensory Function: Within functional limits   Ice Chips Ice chips: Within functional limits   Thin Liquid Thin Liquid: Impaired Presentation: Spoon Oral Phase Impairments: Poor awareness of bolus;Reduced lingual movement/coordination Pharyngeal  Phase Impairments: Multiple swallows;Wet Vocal Quality;Throat Clearing - Delayed;Throat  Clearing - Immediate;Cough - Immediate;Cough - Delayed    Nectar Thick Nectar Thick Liquid: Within functional limits   Honey Thick Honey Thick Liquid: Not tested   Puree Puree: Within functional limits   Solid     Solid: Not tested     Chanler Schreiter H. Roddie Mc, CCC-SLP Speech Language Pathologist  Wende Bushy 11/21/2020,2:13 PM

## 2020-11-21 NOTE — Progress Notes (Signed)
Subjective: Patient is alert and resting in bed comfortably. Oriented to self only. Denies anything bothering him. Says no to abdominal pain, nausea, vomiting, long history of dysphagia, constipation, or diarrhea though I am not sure this history is reliable.   Spoke with nurse Katie who states patient has been doing ok overall today. No BRBPR or melena.  In fact, no bowel movement since admission.  No nausea or vomiting. States speech therapy saw patient today and will likely be placing orders for puree diet and nectar thick trial today with MBSS likely tomorrow. Palliative is also on board and discussing possibility of hospice care.   Objective: Vital signs in last 24 hours: Temp:  [99.3 F (37.4 C)-100.8 F (38.2 C)] 99.5 F (37.5 C) (06/21 1300) Pulse Rate:  [68-114] 92 (06/21 1300) Resp:  [19-34] 22 (06/21 1300) BP: (115-171)/(50-102) 167/102 (06/21 1200) SpO2:  [95 %-100 %] 100 % (06/21 1300) Weight:  [58.7 kg] 58.7 kg (06/21 0448)   General:   Alert, frail, chronically ill appearing, resting in bed comfortably with no acute distress. Abdomen:  Bowel sounds present, soft, non-tender, non-distended. No HSM or hernias noted. No rebound or guarding. No masses appreciated  Extremities:  Without edema. Neurologic:  Alert and  oriented to self only.  Intake/Output from previous day: 06/20 0701 - 06/21 0700 In: 317.7 [I.V.:216; IV Piggyback:101.7] Out: 300 [Urine:300] Intake/Output this shift: Total I/O In: 607.1 [I.V.:134.7; IV Piggyback:472.4] Out: -   Lab Results: Recent Labs    11/19/20 1546 11/20/20 0440 11/21/20 0702  WBC 8.3 9.6 10.6*  HGB 13.2 12.1* 11.2*  HCT 44.0 40.4 37.3*  PLT 214 144* 141*   BMET Recent Labs    11/19/20 1546 11/20/20 0440 11/21/20 0702  NA 140 142 143  K 3.9 3.5 2.9*  CL 103 105 109  CO2 29 28 25   GLUCOSE 148* 104* 84  BUN 28* 31* 34*  CREATININE 0.78 1.04 0.95  CALCIUM 9.0 9.0 8.6*   LFT Recent Labs    11/19/20 1546  11/20/20 0440  PROT 7.6 6.2*  ALBUMIN 4.4 3.5  AST 23 32  ALT 14 17  ALKPHOS 74 59  BILITOT 0.3 0.6   PT/INR Recent Labs    11/20/20 0440  LABPROT 18.4*  INR 1.5*    Studies/Results: DG Abd 1 View  Result Date: 11/19/2020 CLINICAL DATA:  Check gastric catheter placement EXAM: ABDOMEN - 1 VIEW COMPARISON:  None. FINDINGS: Gastric catheter is noted within the stomach. Loop is noted within the hiatal hernia above the diaphragm. No obstructive changes are noted. IMPRESSION: Gastric catheter with tip in the distal stomach. Electronically Signed   By: Inez Catalina M.D.   On: 11/19/2020 23:39   CT HEAD WO CONTRAST  Result Date: 11/20/2020 CLINICAL DATA:  Acute neurologic deficit EXAM: CT HEAD WITHOUT CONTRAST TECHNIQUE: Contiguous axial images were obtained from the base of the skull through the vertex without intravenous contrast. COMPARISON:  None. FINDINGS: Brain: There is no mass, hemorrhage or extra-axial collection. There is generalized atrophy without lobar predilection. Hypodensity of the white matter is most commonly associated with chronic microvascular disease. Vascular: Atherosclerotic calcification of the internal carotid arteries at the skull base. No abnormal hyperdensity of the major intracranial arteries or dural venous sinuses. Skull: The visualized skull base, calvarium and extracranial soft tissues are normal. Sinuses/Orbits: No fluid levels or advanced mucosal thickening of the visualized paranasal sinuses. No mastoid or middle ear effusion. The orbits are normal. IMPRESSION: Generalized atrophy and  chronic microvascular ischemia without acute intracranial abnormality. Electronically Signed   By: Ulyses Jarred M.D.   On: 11/20/2020 02:27   CT Chest Wo Contrast  Result Date: 11/19/2020 CLINICAL DATA:  Provided history of pneumothorax. Patient choked on a piece of chicken. EXAM: CT CHEST WITHOUT CONTRAST TECHNIQUE: Multidetector CT imaging of the chest was performed following  the standard protocol without IV contrast. COMPARISON:  Radiograph earlier today.  Chest CT 10 17 FINDINGS: Cardiovascular: Aortic atherosclerosis and tortuosity. Post median sternotomy and CABG. Prominent dilatation of the central pulmonary arteries with main pulmonary artery measuring 4 cm, right pulmonary artery measuring 4.2 cm and left pulmonary artery measuring 3.5 cm. Dense coronary artery calcification of native coronary arteries. Multi chamber cardiomegaly. No pericardial effusion. Mediastinum/Nodes: Large hiatal hernia with the entire stomach being intrathoracic. There is a granular axial a rotation of the stomach. The esophagus is dilated to the level of the thoracic inlet. Upper esophagus is patulous. No evidence of pneumomediastinum. Endotracheal tube tip above the carina. No enlarged mediastinal lymph nodes. Limited hilar assessment. No thyroid nodule. Lungs/Pleura: No pneumothorax. Chronic volume loss in the left lower lobe with occlusion of the left lower lobe bronchus, likely compressive. Chronic left basilar atelectasis. Minimal scarring in the anterior and lateral right upper lobe, similar to prior exam. Emphysema. Minimal right lower lobe bronchiectasis. No acute confluent airspace disease. No pulmonary mass. No pleural fluid. Upper Abdomen: Large hiatal hernia with the entire stomach being intrathoracic. Cholecystectomy. No acute upper abdominal findings. Musculoskeletal: Exaggerated thoracic kyphosis with bony under mineralization. Chronic L2 compression fracture is unchanged from 11/18/2019 abdominal CT. Median sternotomy. No acute osseous abnormalities are seen. IMPRESSION: 1. Large hiatal hernia with the entire stomach being intrathoracic. The esophagus is fluid-filled and dilated to the level of the thoracic inlet. No pneumomediastinum. 2. No pneumothorax. 3. Cardiomegaly post CABG. Prominent dilatation of the central pulmonary arteries consistent with pulmonary arterial hypertension. 4.  Chronic volume loss in the left lower lobe with occlusion of the left lower lobe bronchus, likely compressive. Chronic left basilar atelectasis. 5. Emphysema. Aortic Atherosclerosis (ICD10-I70.0) and Emphysema (ICD10-J43.9). Electronically Signed   By: Keith Rake M.D.   On: 11/19/2020 17:06   DG CHEST PORT 1 VIEW  Result Date: 11/20/2020 CLINICAL DATA:  Aspiration EXAM: PORTABLE CHEST 1 VIEW COMPARISON:  11/19/2020 and prior studies FINDINGS: The cardiomediastinal silhouette is unchanged. A large hiatal hernia is present. An endotracheal tube with tip 4.5 cm above the carina and NG tube extending through the intrathoracic hiatal hernia and into the UPPER abdomen noted. LEFT LOWER lung atelectasis is again noted. No pneumothorax identified. IMPRESSION: No significant change. Large hiatal hernia and LEFT LOWER lung atelectasis. Electronically Signed   By: Margarette Canada M.D.   On: 11/20/2020 13:44   DG Chest Port 1 View  Result Date: 11/19/2020 CLINICAL DATA:  OG tube placement EXAM: PORTABLE CHEST 1 VIEW COMPARISON:  11/19/2020 FINDINGS: Endotracheal tube terminates at the level of the clavicular heads. Interval placement of enteric tube with side port terminating at the level of the carina and distal tip terminating within the lower chest. Large hiatal hernia. Emphysematous changes of the lung fields. Left basilar opacities, better seen on recent CT. No pneumothorax. IMPRESSION: Interval placement of enteric tube. The distal tip is likely positioned within patient's large hiatal hernia. The side port is likely within the distal esophagus. Recommend advancement. Electronically Signed   By: Davina Poke D.O.   On: 11/19/2020 18:56   DG Chest Portable 1  View  Result Date: 11/19/2020 CLINICAL DATA:  Choking post intubation. EXAM: PORTABLE CHEST 1 VIEW COMPARISON:  December 31, 2017 FINDINGS: Endotracheal tube terminates 4 cm above the carina, appropriately position. Enlarged cardiac silhouette. No  evidence of pneumothorax. Possible left pleural effusion. Osseous structures are without acute abnormality. Soft tissues are grossly normal. IMPRESSION: 1. Endotracheal tube terminates 4 cm above the carina, appropriately positioned. 2. Enlarged cardiac silhouette. 3. Possible left pleural effusion. Electronically Signed   By: Fidela Salisbury M.D.   On: 11/19/2020 16:13   Bedside bronchoscopy  Result Date: 11/19/2020 Darrell Bo, MD     11/19/2020  9:15 PM Bedside bronchoscopy Date/Time: 11/19/2020 5:49 PM Performed by: Darrell Bo, MD Authorized by: Darrell Bo, MD Consent: Verbal consent obtained. Risks and benefits: risks, benefits and alternatives were discussed Consent given by: spouse Imaging studies: imaging studies available Patient identity confirmed: hospital-assigned identification number Time out: Immediately prior to procedure a "time out" was called to verify the correct patient, procedure, equipment, support staff and site/side marked as required. Local anesthesia used: no Anesthesia: Local anesthesia used: no Sedation: Patient sedated: yes Sedatives: propofol Patient tolerance: patient tolerated the procedure well with no immediate complications Comments: Patient was previously sedated for ventilator management.  Bronchoscopy done to 4 levels on right, 5 levels on the left, no foreign body obstruction, fluid collection or significant mucus accumulation.    Assessment: 87 year old male with multiple medical problems including COPD, HTN, dementia, atrial fibrillation and previous PE on Xarelto, known large hiatal hernia, GERD, erosive reflux esophagitis, and esophageal stricture documented in 2013 who presented to the emergency room with esophageal food impaction while eating chicken with associated respiratory distress and subsequently intubated. Also felt to have aspiration pneumonia and started on IV antibiotics.  Respiratory status stabilized after intubation.  He underwent EGD on  6/19 with successful removal of all food particles. EGD revealed grade A esophagitis, benign-appearing esophageal stenosis, large hiatal hernia with organoaxial rotation of the stomach, erythematous, edematous mucosa in the pylorus.  Unable to dilate due to Xarelto.  He was started on Protonix 40 mg twice daily and Xarelto held as Dr. Laural Golden recommended repeat EGD in 48 hours for possible dilation. Currently on heparin  Discussed with Dr. Jenetta Downer who recommended holding off on repeat EGD with possible dilation.  Dr. Jenetta Downer has recommended waiting at least 4 weeks to have esophageal dilation. Patient has remained n.p.o. this admission.  Per Dr. Manuella Ghazi, speech therapy is on board with plans for modified barium swallow tomorrow.  Additionally, palliative care is on board with some discussion about transitioning to hospice though this decision has not been made.    We will await SLP recommendations for advancing diet.  May need to consider full liquid/pured diet while awaiting repeat EGD to ensure no recurrent food impaction.  From a respiratory standpoint, he was extubated on 6/20, O2 saturation 100% on 4 L nasal cannula. He remains on antibiotics.   Plan: 1.  Per Dr. Jenetta Downer consider repeat EGD with possible esophageal dilation in about 4 weeks.  2.  Continue PPI twice daily.  3.  Advance diet per SLP.  Likely needs to stay on full liquids and/or pured diet until repeat EGD to ensure no recurrence of food impaction. Could consider mechanical soft diet with chopped meats though I am not sure this will be regulated well if patient is discharged home.   4.  Appreciate palliative care input.      LOS: 2 days    11/21/2020, 1:11  PM   Aliene Altes, [facility]

## 2020-11-21 NOTE — Progress Notes (Signed)
Palliative: Mr. Letizia is lying quietly in bed.  He will briefly make but not keep eye contact.  He appears acutely/chronically ill and very frail.  I ask if he is thirsty or hungry and he shakes his head no.  I tell him that I will come back later when his wife visits and he nods his head affirmatively.  He does not speak, and I do not believe that he can make his basic needs known.  I return later in the morning for scheduled meeting with Mrs. Ivin Booty.  We go to the palliative office for a consult.  We talked about Mr. Garrelts, who he is.  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  They have been married approximately 34 years.  They have 1 child, daughter Joseph Art.  Mr. Macphail has a son, Lennette Bihari, from a previous marriage.  Over the last few months Mr. Schaum has had a decrease in functional status.  He is unable to walk without a walker.  He is incontinent of urine and stool.  He is unable to bathe or dress himself.  Mrs. Tilly endorses weight loss, but is unable to give a number.  She shares that he is always been "a picky eater".  We talked in detail about Mr. Longino's acute health concerns and the treatment plan including, but not limited to, EGD, GI consult, the treatment plan including medicines and labs.  We talked in detail about the chronic illness pathway for dementia, progressive incurable disease.  We talked about mental changes, physical changes, nutritional changes.  I share a diagram of the chronic illness pathway, what is normal and expected.  We talked about disposition options including, but not limited to, short-term rehab-Mrs. Nawrot does not believe that Mr. Waas has the ability to rehab, long-term care-Mr. Keena had Medicaid in the past, but always begs to come home, she does not feel that he would want to stay in long-term care, residential hospice-focusing on comfort and dignity, let nature take its course, home with hospice-transitioning to residential  hospice when the time is right.  We talk in detail about what is and is not provided at home hospice and residential hospice.  We talked about how to make choices for loved ones including 1) keeping them at the center of decision-making, not what we want for them 2) are we doing something for him or to him, can we change what is happening 3) the person Mr. Deerman was 10 years ago, how would that man tell her to care for him now.  Conference with attending, bedside nursing staff, transition of care team related to patient condition, needs, goals of care.  Plan:   Continue to treat the treatable but no CPR or intubation.  Time for outcomes.  Considering home with hospice care. PMT to return to service 6/23.  We will follow-up then.  70 minutes, extended time   Quinn Axe, NP Palliative medicine team Team phone [phone] Greater than 50% of this time was spent counseling and coordinating care related to the above assessment and plan.

## 2020-11-21 NOTE — Progress Notes (Signed)
ANTICOAGULATION CONSULT NOTE -   Pharmacy Consult for Heparin  Indication: atrial fibrillation, history of PE  Allergies  Allergen Reactions   Lovenox [Enoxaparin Sodium] Swelling   Penicillins Hives    Has patient had a PCN reaction causing immediate rash, facial/tongue/throat swelling, SOB or lightheadedness with hypotension: Yes Has patient had a PCN reaction causing severe rash involving mucus membranes or skin necrosis: No Has patient had a PCN reaction that required hospitalization: Unknown Has patient had a PCN reaction occurring within the last 10 years: No If all of the above answers are "NO", then may proceed with Cephalosporin use.    Tape Other (See Comments)    SKIN WILL TEAR IF TAPED (MUST BE LIGHT & BRIEF OR USE COBAN WRAP, PLEASE!!)    Patient Measurements: Height: 5\' 11"  (180.3 cm) Weight: 58.7 kg (129 lb 6.6 oz) IBW/kg (Calculated) : 75.3  Vital Signs: Temp: 99.86 F (37.7 C) (06/21 0800) BP: 156/98 (06/21 0800) Pulse Rate: 92 (06/21 0800)  Labs: Recent Labs    11/19/20 1546 11/20/20 0440 11/20/20 0824 11/20/20 1812 11/21/20 0702 11/21/20 0703  HGB 13.2 12.1*  --   --  11.2*  --   HCT 44.0 40.4  --   --  37.3*  --   PLT 214 144*  --   --  141*  --   APTT  --   --  59* 118* 63*  --   LABPROT  --  18.4*  --   --   --   --   INR  --  1.5*  --   --   --   --   HEPARINUNFRC  --   --  >1.10*  --   --  0.61  CREATININE 0.78 1.04  --   --  0.95  --      Estimated Creatinine Clearance: 46.3 mL/min (by C-G formula based on SCr of 0.95 mg/dL).   Medical History: Past Medical History:  Diagnosis Date   Anemia    ASD (atrial septal defect)    a. s/p repair 1983.   BPH (benign prostatic hyperplasia)    Cerebrovascular accident ([place]) 1990   Chronic bronchitis ([place])    a. h/o resp failure in setting of bronchitis vs PNA 05/2012.   Chronic diarrhea    COPD (chronic obstructive pulmonary disease) (HCC)    Coronary artery disease    a. Nonobstructive  by cath 2009 and 09/06/14   Depression    Dysphagia, unspecified(787.20)    Erosive gastritis    H/o GIB felt secondary to this   Esophagitis    Falls frequently    Gastric polyp    Gastric ulcer    GERD (gastroesophageal reflux disease)    Hearing impairment    Hiatal hernia    History of upper gastrointestinal hemorrhage    dr. Maurene Capes, GI   Hyperlipidemia    Hypertension    Internal hemorrhoids    LV dysfunction    a. EF 45-50% by echo 05/2012.   Permanent atrial fibrillation (HCC)    INR [facility] at [facility].   Vision problems      Assessment: 87 y/o M with esophageal obstruction after eating chicken, required intubation in the ED, on Xarelto PTA for afib, holding Xarelto and starting heparin while intubated. It has been greater that 24 hours since last Xarelto dose (6/18 at ~1700), ok to start heparin now. Anticipate using aPTT to dose for now given Xarelto influence on anti-Xa levels. CBC/renal function good. Noted  Lovenox allergy on profile, patient has tolerated heparin drip in the past when he had a PE.   APTT 63- slightly subtherapeutic HL 0.61- still no correlation with APTT  Goal of Therapy:  Heparin level 0.3-0.7 units/ml aPTT 66-102 seconds Monitor platelets by anticoagulation protocol: Yes   Plan:  Increase heparin infusion to 950 units/hr APTT in 8 hours and APTT/Heparin level daily Monitor for bleeding  Margot Ables, PharmD Clinical Pharmacist 11/21/2020 8:51 AM

## 2020-11-21 NOTE — Progress Notes (Deleted)
Nutrition Follow Up Brief Note  Chart and GOC reviewed.  Pt now transitioning to comfort care/hospice.   No further nutrition interventions planned at this time. Please re-consult if needed.   Derrel Nip, RD, LDN Registered Dietitian I After-Hours/Weekend Pager # in [place]

## 2020-11-21 NOTE — Progress Notes (Signed)
PROGRESS NOTE    [name]  IFO:[id number] DOB: 1933-02-08 DOA: 11/19/2020 PCP: [name], MD   Brief Narrative:   [name] is an 87 y.o. male with medical history significant for Atria fib on Xarelto, HTN, COPD chronic respiratory failure, dementia and frequent falls who presents to the emergency department via EMS after choking on chicken at home.  He was admitted with acute on chronic hypoxemic and hypercapnic respiratory failure secondary to presumed aspiration pneumonia and had to urgently undergo EGD due to esophageal obstruction with pieces of chicken.  He was intubated for airway protection and has subsequently been extubated on 6/20. Palliative is having discussions with family regarding discharge to either home hospice or residential hospice. SLP planning for MBSS in am, 6/21.  Assessment & Plan:   Principal Problem:   Aspiration pneumonia ([place]) Active Problems:   Hyperlipidemia   Essential hypertension   GERD   Hiatal hernia   Hyperglycemia   Atrial fibrillation with rapid ventricular response (HCC)   Esophageal stricture   COPD (chronic obstructive pulmonary disease) (HCC)   Dementia (HCC)   Frequent falls   Acute on chronic respiratory failure with hypoxia and hypercapnia (HCC)   Hypothermia   Protein-calorie malnutrition, severe   Aspiration into airway   Foreign body in esophagus   Acute on chronic hypoxemic and hypercapnic respiratory failure-multifactorial -Noted to be in the setting of esophageal obstruction along with aspiration pneumonia -Continue IV Rocephin and metronidazole -Appreciate PCCM involvement and patient has now been extubated on 6/20 -Palliative medicine discussing with family regarding need for home hospice/residential hospice, but family is not ready to make that decision quite yet.  Hopefully this will be decided in the next 1-2 days.   Esophageal obstruction with foreign bodies -Status post EGD by GI on 6/19 with  removal of pieces of chicken -Remains on PPI as prescribed -Noted to also have some grade a esophagitis -May consider esophageal dilation with repeat EGD in 4 weeks per GI  Hypokalemia/hypomagnesemia -Replete and re-evaluate in am   Hypothermia-resolved -TSH 1.68   Atrial fibrillation -Continue heparin drip, holding Xarelto -Metoprolol IV every 8 hours for heart rate control   Essential hypertension -Cardizem and Imdur currently held -Low-dose IV metoprolol as above -IV hydralazine prn   Dyslipidemia -Atorvastatin   Frequent falls/dementia/failure to thrive -Palliative having discussions regarding hospice.     DVT prophylaxis: Heparin drip Code Status: DNR Family Communication: Spoke with spouse on phone 6/20 Disposition Plan:  Status is: Inpatient   Remains inpatient appropriate because:Altered mental status, IV treatments appropriate due to intensity of illness or inability to take PO, and Inpatient level of care appropriate due to severity of illness   Dispo: The patient is from: Home              Anticipated d/c is to: Home              Patient currently is not medically stable to d/c.              Difficult to place patient No   Consultants:  GI PCCM Palliative care   Procedures:  EGD 6/19 Intubation 6/19  Antimicrobials:  Anti-infectives (From admission, onward)    Start     Dose/Rate Route Frequency Ordered Stop   11/19/20 1945  metroNIDAZOLE (FLAGYL) IVPB 500 mg        500 mg 100 mL/hr over 60 Minutes Intravenous Every 8 hours 11/19/20 1931     11/19/20 1930  aztreonam (AZACTAM) 2 g in sodium chloride 0.9 % 100 mL IVPB  Status:  Discontinued        2 g 200 mL/hr over 30 Minutes Intravenous Every 8 hours 11/19/20 1921 11/19/20 1926   11/19/20 1930  cefTRIAXone (ROCEPHIN) 2 g in sodium chloride 0.9 % 100 mL IVPB        2 g 200 mL/hr over 30 Minutes Intravenous Every 24 hours 11/19/20 1926         Subjective: Patient seen and evaluated today  with unresponsiveness this morning.  He does have intermittent periods of increased responsiveness.  Objective: Vitals:   11/21/20 1100 11/21/20 1200 11/21/20 1300 11/21/20 1400  BP:  (!) 167/102  (!) 169/92  Pulse: 88 89 92 82  Resp: (!) 25 (!) 25 (!) 22 (!) 27  Temp: 99.5 F (37.5 C) 99.32 F (37.4 C) 99.5 F (37.5 C) 99.5 F (37.5 C)  TempSrc:      SpO2: 100% 100% 100% 100%  Weight:      Height:        Intake/Output Summary (Last 24 hours) at 11/21/2020 1423 Last data filed at 11/21/2020 0735 Gross per 24 hour  Intake 746.86 ml  Output 300 ml  Net 446.86 ml   Filed Weights   11/19/20 1608 11/21/20 0448  Weight: 60.3 kg 58.7 kg    Examination:  General exam: Unresponsive, frail/elderly Respiratory system: Clear to auscultation. Respiratory effort normal. Cardiovascular system: S1 & S2 heard, RRR.  Gastrointestinal system: Abdomen is soft Central nervous system: Unresponsive to questioning Extremities: No edema Skin: No significant lesions noted Psychiatry: Flat affect.    Data Reviewed: I have personally reviewed following labs and imaging studies  CBC: Recent Labs  Lab 11/19/20 1546 11/20/20 0440 11/21/20 0702  WBC 8.3 9.6 10.6*  NEUTROABS 5.3  --   --   HGB 13.2 12.1* 11.2*  HCT 44.0 40.4 37.3*  MCV 88.4 87.6 87.4  PLT 214 144* 867*   Basic Metabolic Panel: Recent Labs  Lab 11/19/20 1546 11/20/20 0440 11/20/20 1812 11/21/20 0702  NA 140 142  --  143  K 3.9 3.5  --  2.9*  CL 103 105  --  109  CO2 29 28  --  25  GLUCOSE 148* 104*  --  84  BUN 28* 31*  --  34*  CREATININE 0.78 1.04  --  0.95  CALCIUM 9.0 9.0  --  8.6*  MG  --  1.7 1.6* 1.6*  PHOS  --  1.7* 2.8 2.5   GFR: Estimated Creatinine Clearance: 46.3 mL/min (by C-G formula based on SCr of 0.95 mg/dL). Liver Function Tests: Recent Labs  Lab 11/19/20 1546 11/20/20 0440  AST 23 32  ALT 14 17  ALKPHOS 74 59  BILITOT 0.3 0.6  PROT 7.6 6.2*  ALBUMIN 4.4 3.5   No results for  input(s): LIPASE, AMYLASE in the last 168 hours. No results for input(s): AMMONIA in the last 168 hours. Coagulation Profile: Recent Labs  Lab 11/20/20 0440  INR 1.5*   Cardiac Enzymes: No results for input(s): CKTOTAL, CKMB, CKMBINDEX, TROPONINI in the last 168 hours. BNP (last 3 results) No results for input(s): PROBNP in the last 8760 hours. HbA1C: No results for input(s): HGBA1C in the last 72 hours. CBG: Recent Labs  Lab 11/20/20 1202 11/20/20 1630 11/20/20 2021 11/21/20 0756 11/21/20 1117  GLUCAP 109* 91 83 86 79   Lipid Profile: Recent Labs    11/21/20 0703  TRIG 86  Thyroid Function Tests: Recent Labs    11/20/20 0440  TSH 1.681   Anemia Panel: No results for input(s): VITAMINB12, FOLATE, FERRITIN, TIBC, IRON, RETICCTPCT in the last 72 hours. Sepsis Labs: Recent Labs  Lab 11/20/20 0440  PROCALCITON 0.63    Recent Results (from the past 240 hour(s))  Resp Panel by RT-PCR (Flu A&B, Covid) Nasopharyngeal Swab     Status: None   Collection Time: 11/19/20  3:54 PM   Specimen: Nasopharyngeal Swab; Nasopharyngeal(NP) swabs in vial transport medium  Result Value Ref Range Status   SARS Coronavirus 2 by RT PCR NEGATIVE NEGATIVE Final    Comment: (NOTE) SARS-CoV-2 target nucleic acids are NOT DETECTED.  The SARS-CoV-2 RNA is generally detectable in upper respiratory specimens during the acute phase of infection. The lowest concentration of SARS-CoV-2 viral copies this assay can detect is 138 copies/mL. A negative result does not preclude SARS-Cov-2 infection and should not be used as the sole basis for treatment or other patient management decisions. A negative result may occur with  improper specimen collection/handling, submission of specimen other than nasopharyngeal swab, presence of viral mutation(s) within the areas targeted by this assay, and inadequate number of viral copies(<138 copies/mL). A negative result must be combined with clinical  observations, patient history, and epidemiological information. The expected result is Negative.  Fact Sheet for Patients:  EntrepreneurPulse.com.au  Fact Sheet for Healthcare Providers:  IncredibleEmployment.be  This test is no t yet approved or cleared by the [place] FDA and  has been authorized for detection and/or diagnosis of SARS-CoV-2 by FDA under an Emergency Use Authorization (EUA). This EUA will remain  in effect (meaning this test can be used) for the duration of the COVID-19 declaration under Section 564(b)(1) of the Act, 21 U.S.C.section 360bbb-3(b)(1), unless the authorization is terminated  or revoked sooner.       Influenza A by PCR NEGATIVE NEGATIVE Final   Influenza B by PCR NEGATIVE NEGATIVE Final    Comment: (NOTE) The Xpert Xpress SARS-CoV-2/FLU/RSV plus assay is intended as an aid in the diagnosis of influenza from Nasopharyngeal swab specimens and should not be used as a sole basis for treatment. Nasal washings and aspirates are unacceptable for Xpert Xpress SARS-CoV-2/FLU/RSV testing.  Fact Sheet for Patients: EntrepreneurPulse.com.au  Fact Sheet for Healthcare Providers: IncredibleEmployment.be  This test is not yet approved or cleared by the [place] FDA and has been authorized for detection and/or diagnosis of SARS-CoV-2 by FDA under an Emergency Use Authorization (EUA). This EUA will remain in effect (meaning this test can be used) for the duration of the COVID-19 declaration under Section 564(b)(1) of the Act, 21 U.S.C. section 360bbb-3(b)(1), unless the authorization is terminated or revoked.  Performed at [facility], [address] [place], [place] [address]   MRSA Next Gen by PCR, Nasal     Status: None   Collection Time: 11/19/20  9:40 PM   Specimen: Nasal Mucosa; Nasal Swab  Result Value Ref Range Status   MRSA by PCR Next Gen NOT DETECTED NOT DETECTED  Final    Comment: (NOTE) The GeneXpert MRSA Assay (FDA approved for NASAL specimens only), is one component of a comprehensive MRSA colonization surveillance program. It is not intended to diagnose MRSA infection nor to guide or monitor treatment for MRSA infections. Test performance is not FDA approved in patients less than 52 years old. Performed at [facility], [address], [place], [place] [address]          Radiology  Studies: DG Abd 1 View  Result Date: 11/19/2020 CLINICAL DATA:  Check gastric catheter placement EXAM: ABDOMEN - 1 VIEW COMPARISON:  None. FINDINGS: Gastric catheter is noted within the stomach. Loop is noted within the hiatal hernia above the diaphragm. No obstructive changes are noted. IMPRESSION: Gastric catheter with tip in the distal stomach. Electronically Signed   By: Inez Catalina M.D.   On: 11/19/2020 23:39   CT HEAD WO CONTRAST  Result Date: 11/20/2020 CLINICAL DATA:  Acute neurologic deficit EXAM: CT HEAD WITHOUT CONTRAST TECHNIQUE: Contiguous axial images were obtained from the base of the skull through the vertex without intravenous contrast. COMPARISON:  None. FINDINGS: Brain: There is no mass, hemorrhage or extra-axial collection. There is generalized atrophy without lobar predilection. Hypodensity of the white matter is most commonly associated with chronic microvascular disease. Vascular: Atherosclerotic calcification of the internal carotid arteries at the skull base. No abnormal hyperdensity of the major intracranial arteries or dural venous sinuses. Skull: The visualized skull base, calvarium and extracranial soft tissues are normal. Sinuses/Orbits: No fluid levels or advanced mucosal thickening of the visualized paranasal sinuses. No mastoid or middle ear effusion. The orbits are normal. IMPRESSION: Generalized atrophy and chronic microvascular ischemia without acute intracranial abnormality. Electronically Signed   By: Ulyses Jarred M.D.   On:  11/20/2020 02:27   CT Chest Wo Contrast  Result Date: 11/19/2020 CLINICAL DATA:  Provided history of pneumothorax. Patient choked on a piece of chicken. EXAM: CT CHEST WITHOUT CONTRAST TECHNIQUE: Multidetector CT imaging of the chest was performed following the standard protocol without IV contrast. COMPARISON:  Radiograph earlier today.  Chest CT 10 17 FINDINGS: Cardiovascular: Aortic atherosclerosis and tortuosity. Post median sternotomy and CABG. Prominent dilatation of the central pulmonary arteries with main pulmonary artery measuring 4 cm, right pulmonary artery measuring 4.2 cm and left pulmonary artery measuring 3.5 cm. Dense coronary artery calcification of native coronary arteries. Multi chamber cardiomegaly. No pericardial effusion. Mediastinum/Nodes: Large hiatal hernia with the entire stomach being intrathoracic. There is a granular axial a rotation of the stomach. The esophagus is dilated to the level of the thoracic inlet. Upper esophagus is patulous. No evidence of pneumomediastinum. Endotracheal tube tip above the carina. No enlarged mediastinal lymph nodes. Limited hilar assessment. No thyroid nodule. Lungs/Pleura: No pneumothorax. Chronic volume loss in the left lower lobe with occlusion of the left lower lobe bronchus, likely compressive. Chronic left basilar atelectasis. Minimal scarring in the anterior and lateral right upper lobe, similar to prior exam. Emphysema. Minimal right lower lobe bronchiectasis. No acute confluent airspace disease. No pulmonary mass. No pleural fluid. Upper Abdomen: Large hiatal hernia with the entire stomach being intrathoracic. Cholecystectomy. No acute upper abdominal findings. Musculoskeletal: Exaggerated thoracic kyphosis with bony under mineralization. Chronic L2 compression fracture is unchanged from 11/18/2019 abdominal CT. Median sternotomy. No acute osseous abnormalities are seen. IMPRESSION: 1. Large hiatal hernia with the entire stomach being  intrathoracic. The esophagus is fluid-filled and dilated to the level of the thoracic inlet. No pneumomediastinum. 2. No pneumothorax. 3. Cardiomegaly post CABG. Prominent dilatation of the central pulmonary arteries consistent with pulmonary arterial hypertension. 4. Chronic volume loss in the left lower lobe with occlusion of the left lower lobe bronchus, likely compressive. Chronic left basilar atelectasis. 5. Emphysema. Aortic Atherosclerosis (ICD10-I70.0) and Emphysema (ICD10-J43.9). Electronically Signed   By: Keith Rake M.D.   On: 11/19/2020 17:06   DG CHEST PORT 1 VIEW  Result Date: 11/20/2020 CLINICAL DATA:  Aspiration EXAM: PORTABLE CHEST 1 VIEW  COMPARISON:  11/19/2020 and prior studies FINDINGS: The cardiomediastinal silhouette is unchanged. A large hiatal hernia is present. An endotracheal tube with tip 4.5 cm above the carina and NG tube extending through the intrathoracic hiatal hernia and into the UPPER abdomen noted. LEFT LOWER lung atelectasis is again noted. No pneumothorax identified. IMPRESSION: No significant change. Large hiatal hernia and LEFT LOWER lung atelectasis. Electronically Signed   By: Margarette Canada M.D.   On: 11/20/2020 13:44   DG Chest Port 1 View  Result Date: 11/19/2020 CLINICAL DATA:  OG tube placement EXAM: PORTABLE CHEST 1 VIEW COMPARISON:  11/19/2020 FINDINGS: Endotracheal tube terminates at the level of the clavicular heads. Interval placement of enteric tube with side port terminating at the level of the carina and distal tip terminating within the lower chest. Large hiatal hernia. Emphysematous changes of the lung fields. Left basilar opacities, better seen on recent CT. No pneumothorax. IMPRESSION: Interval placement of enteric tube. The distal tip is likely positioned within patient's large hiatal hernia. The side port is likely within the distal esophagus. Recommend advancement. Electronically Signed   By: Davina Poke D.O.   On: 11/19/2020 18:56   DG  Chest Portable 1 View  Result Date: 11/19/2020 CLINICAL DATA:  Choking post intubation. EXAM: PORTABLE CHEST 1 VIEW COMPARISON:  December 31, 2017 FINDINGS: Endotracheal tube terminates 4 cm above the carina, appropriately position. Enlarged cardiac silhouette. No evidence of pneumothorax. Possible left pleural effusion. Osseous structures are without acute abnormality. Soft tissues are grossly normal. IMPRESSION: 1. Endotracheal tube terminates 4 cm above the carina, appropriately positioned. 2. Enlarged cardiac silhouette. 3. Possible left pleural effusion. Electronically Signed   By: Fidela Salisbury M.D.   On: 11/19/2020 16:13   Bedside bronchoscopy  Result Date: 11/19/2020 Daleen Bo, MD     11/19/2020  9:15 PM Bedside bronchoscopy Date/Time: 11/19/2020 5:49 PM Performed by: Daleen Bo, MD Authorized by: Daleen Bo, MD Consent: Verbal consent obtained. Risks and benefits: risks, benefits and alternatives were discussed Consent given by: spouse Imaging studies: imaging studies available Patient identity confirmed: hospital-assigned identification number Time out: Immediately prior to procedure a "time out" was called to verify the correct patient, procedure, equipment, support staff and site/side marked as required. Local anesthesia used: no Anesthesia: Local anesthesia used: no Sedation: Patient sedated: yes Sedatives: propofol Patient tolerance: patient tolerated the procedure well with no immediate complications Comments: Patient was previously sedated for ventilator management.  Bronchoscopy done to 4 levels on right, 5 levels on the left, no foreign body obstruction, fluid collection or significant mucus accumulation.        Scheduled Meds:  atorvastatin  10 mg Oral QHS   chlorhexidine gluconate (MEDLINE KIT)  15 mL Mouth Rinse BID   Chlorhexidine Gluconate Cloth  6 each Topical Daily   mouth rinse  15 mL Mouth Rinse q12n4p   metoprolol tartrate  2.5 mg Intravenous Q8H    pantoprazole (PROTONIX) IV  40 mg Intravenous Q12H   Continuous Infusions:  cefTRIAXone (ROCEPHIN)  IV Stopped (11/20/20 2206)   feeding supplement (VITAL AF 1.2 CAL) Stopped (11/20/20 1312)   heparin 950 Units/hr (11/21/20 0855)   magnesium sulfate bolus IVPB     metronidazole 500 mg (11/21/20 1419)     LOS: 2 days    Time spent: 35 minutes    Kana Reimann D Manuella Ghazi, DO [facility]  If 7PM-7AM, please contact night-coverage [email or web address] 11/21/2020, 2:23 PM

## 2020-11-22 ENCOUNTER — Inpatient Hospital Stay (HOSPITAL_COMMUNITY): Payer: Medicare PPO

## 2020-11-22 DIAGNOSIS — T17908D Unspecified foreign body in respiratory tract, part unspecified causing other injury, subsequent encounter: Secondary | ICD-10-CM

## 2020-11-22 DIAGNOSIS — K2100 Gastro-esophageal reflux disease with esophagitis, without bleeding: Secondary | ICD-10-CM

## 2020-11-22 DIAGNOSIS — K209 Esophagitis, unspecified without bleeding: Secondary | ICD-10-CM

## 2020-11-22 LAB — URINE CULTURE: Culture: NO GROWTH

## 2020-11-22 LAB — GLUCOSE, CAPILLARY
Glucose-Capillary: 97 mg/dL (ref 70–99)
Glucose-Capillary: 87 mg/dL (ref 70–99)
Glucose-Capillary: 143 mg/dL — ABNORMAL HIGH (ref 70–99)

## 2020-11-22 LAB — CBC
HCT: 37.5 % — ABNORMAL LOW (ref 39.0–52.0)
Hemoglobin: 11.4 g/dL — ABNORMAL LOW (ref 13.0–17.0)
MCH: 26.6 pg (ref 26.0–34.0)
MCHC: 30.4 g/dL (ref 30.0–36.0)
MCV: 87.6 fL (ref 80.0–100.0)
Platelets: 159 10*3/uL (ref 150–400)
RBC: 4.28 MIL/uL (ref 4.22–5.81)
RDW: 16.2 % — ABNORMAL HIGH (ref 11.5–15.5)
WBC: 9.1 10*3/uL (ref 4.0–10.5)
nRBC: 0 % (ref 0.0–0.2)

## 2020-11-22 LAB — MAGNESIUM: Magnesium: 2.1 mg/dL (ref 1.7–2.4)

## 2020-11-22 LAB — BASIC METABOLIC PANEL
Anion gap: 8 (ref 5–15)
BUN: 37 mg/dL — ABNORMAL HIGH (ref 8–23)
CO2: 26 mmol/L (ref 22–32)
Calcium: 8.8 mg/dL — ABNORMAL LOW (ref 8.9–10.3)
Chloride: 112 mmol/L — ABNORMAL HIGH (ref 98–111)
Creatinine, Ser: 0.91 mg/dL (ref 0.61–1.24)
GFR, Estimated: >60 mL/min (ref >60)
Glucose, Bld: 96 mg/dL (ref 70–99)
Potassium: 3.8 mmol/L (ref 3.5–5.1)
Sodium: 146 mmol/L — ABNORMAL HIGH (ref 135–145)

## 2020-11-22 LAB — HEPARIN LEVEL (UNFRACTIONATED): Heparin Unfractionated: 0.31 IU/mL (ref 0.30–0.70)

## 2020-11-22 LAB — APTT
aPTT: 64 seconds — ABNORMAL HIGH (ref 24–36)
aPTT: 79 seconds — ABNORMAL HIGH (ref 24–36)

## 2020-11-22 MED ORDER — CLONAZEPAM 0.5 MG PO TABS
0.5000 mg | ORAL_TABLET | Freq: Three times a day (TID) | ORAL | Status: DC
  Administered 2020-11-22 – 2020-11-23 (×3): 0.5 mg via ORAL
  Filled 2020-11-22 (×3): qty 1

## 2020-11-22 MED ORDER — ADULT MULTIVITAMIN W/MINERALS CH
1.0000 | ORAL_TABLET | Freq: Every day | ORAL | Status: DC
  Administered 2020-11-23: 1 via ORAL
  Filled 2020-11-22: qty 1

## 2020-11-22 MED ORDER — ORAL CARE MOUTH RINSE
15.0000 mL | Freq: Two times a day (BID) | OROMUCOSAL | Status: DC
  Administered 2020-11-22 – 2020-11-23 (×2): 15 mL via OROMUCOSAL

## 2020-11-22 MED ORDER — CHLORHEXIDINE GLUCONATE 0.12 % MT SOLN
15.0000 mL | Freq: Two times a day (BID) | OROMUCOSAL | Status: DC
  Administered 2020-11-22 – 2020-11-23 (×3): 15 mL via OROMUCOSAL
  Filled 2020-11-22 (×3): qty 15

## 2020-11-22 MED ORDER — MEGESTROL ACETATE 400 MG/10ML PO SUSP
400.0000 mg | Freq: Two times a day (BID) | ORAL | Status: DC
  Administered 2020-11-22 – 2020-11-23 (×3): 400 mg via ORAL
  Filled 2020-11-22 (×4): qty 10

## 2020-11-22 MED ORDER — DEXTROSE-NACL 5-0.45 % IV SOLN: INTRAVENOUS | Status: DC

## 2020-11-22 NOTE — Progress Notes (Signed)
2300- Pt is on a heparin drip. Pt A&O x 0, is combative when Lab tech attempted to get labs. RN helped hold pt down, pt continued to be violent and made sticking him dangerous. Attempt made to draw off existing IV, which flushes, but no blood return. Pt is violent and combative whenever he is touched. No labs obtained. MD notified.

## 2020-11-22 NOTE — Progress Notes (Addendum)
ANTICOAGULATION CONSULT NOTE - Follow Up Consult  Pharmacy Consult for heparin Indication: atrial fibrillation  Labs: Recent Labs    11/20/20 0440 11/20/20 0824 11/20/20 1812 11/21/20 0702 11/21/20 0703 11/22/20 0432 11/22/20 1526  HGB 12.1*  --   --  11.2*  --  11.4*  --   HCT 40.4  --   --  37.3*  --  37.5*  --   PLT 144*  --   --  141*  --  159  --   APTT  --  59*   < > 63*  --  64* 79*  LABPROT 18.4*  --   --   --   --   --   --   INR 1.5*  --   --   --   --   --   --   HEPARINUNFRC  --  >1.10*  --   --  0.61 0.31  --   CREATININE 1.04  --   --  0.95  --  0.91  --    < > = values in this interval not displayed.     Assessment: 87yo male  therapeutic on heparin after rate change; no gtt issues or signs of bleeding per RN.  aPTT 79, therapeutic   Goal of Therapy:  aPTT 66-102 seconds   Plan:  Continue heparin iv @ 1050 units/hr and check PTT in 8 hours.    Thomasenia Sales, PharmD, MBA, BCGP Clinical Pharmacist  11/22/2020,4:55 PM

## 2020-11-22 NOTE — Evaluation (Signed)
Modified Barium Swallow Progress Note  Patient Details  Name: [name] MRN: [id number] Date of Birth: Feb 24, 1933  Today's Date: 11/22/2020  Modified Barium Swallow completed.  Full report located under Chart Review in the Imaging Section.  Brief recommendations include the following:  Clinical Impression  Pt presents with moderate/severe oropharyngeal sensorimotor dysphagia characterized by silent aspiration of thin liquids and NTL; swallowing is characterized by gross pharyngeal weakness and pharyngeal residue of all consistencies and textures. Please note visualization was limited by Pt positioning despite max cues and tactile support to reposition throughout exam. NTL did not appear to be initially aspirated, however, mild/mod residue in the valleculae, lateral channels and pyriforms after the swallow was eventually aspirated and visualized on the posterior wall of the trachea following the swallow. Pt was unable to produce a cued cough to clear aspirates. Mild valleculae and pyriform residue was noted with HTL and puree textures, however no penetration or aspiraiton was visualized with these textures. Pt further demonstrates reduced oral control (was unable to masticate regular trial - was eventually expectorated), reduced epiglottic deflection, decreased pharyngeal peristalsis, decreased laryngeal vestibule closure and decreased BOT retraction. Recommend continue with D1/puree diet and downgrade Pt's diet to HONEY thick liquids; Recommend meds to be crushed in puree. Pt is still at high risk for aspiration even on this least restrictive diet and further note he is also at risk for malnutrition and dehydration. ST will continue to follow for education and goals of care, thank you   Swallow Evaluation Recommendations       SLP Diet Recommendations: Dysphagia 1 (Puree) solids;Honey thick liquids   Liquid Administration via: Straw;Cup   Medication Administration: Crushed with puree    Supervision: Full supervision/cueing for compensatory strategies;Full assist for feeding   Compensations: Minimize environmental distractions;Slow rate;Small sips/bites;Clear throat intermittently;Follow solids with liquid   Postural Changes: Remain semi-upright after after feeds/meals (Comment)   Oral Care Recommendations: Oral care BID   Other Recommendations: Order thickener from pharmacy;Clarify dietary restrictions;Remove water pitcher;Prohibited food (jello, ice cream, thin soups)   Korie Streat H. Roddie Mc, CCC-SLP Speech Language Pathologist  Wende Bushy 11/22/2020,2:50 PM

## 2020-11-22 NOTE — Progress Notes (Signed)
PROGRESS NOTE    [name]  PQZ:[id number] DOB: 23-Oct-1932 DOA: 11/19/2020 PCP: [name], MD     Subjective: The patient was seen and examined this morning, awake alert agitated, confused, in soft restraints    Brief Narrative:   [name] is an 87 y.o. male with medical history significant for [place]ria fib on Xarelto, HTN, COPD chronic respiratory failure, dementia and frequent falls who presents to the emergency department via EMS after choking on chicken at home.  He was admitted with acute on chronic hypoxemic and hypercapnic respiratory failure secondary to presumed aspiration pneumonia and had to urgently undergo EGD due to esophageal obstruction with pieces of chicken.  He was intubated for airway protection and has subsequently been extubated on 6/20. Palliative is having discussions with family regarding discharge to either home hospice or residential hospice. SLP planning for MBSS in am, 6/21.  Assessment & Plan:   Principal Problem:   Aspiration pneumonia ([place]) Active Problems:   Hyperlipidemia   Essential hypertension   GERD   Hiatal hernia   Hyperglycemia   Atrial fibrillation with rapid ventricular response (HCC)   Esophageal stricture   COPD (chronic obstructive pulmonary disease) (HCC)   Dementia (HCC)   Frequent falls   Acute on chronic respiratory failure with hypoxia and hypercapnia (HCC)   Hypothermia   Protein-calorie malnutrition, severe   Aspiration into airway   Foreign body in esophagus   Acute on chronic hypoxemic and hypercapnic respiratory failure-multifactorial -Much improved-currently satting 97% on room air -Noted to be in the setting of esophageal obstruction along with aspiration pneumonia -Continue IV Rocephin and metronidazole -Appreciate PCCM involvement and patient has now been extubated on 6/20 -Palliative medicine discussing with family regarding need for home hospice/residential hospice, but family is not ready to  make that decision quite yet.  Hopefully this will be decided in the next 1-2 days.   Esophageal obstruction with foreign bodies -Status post EGD by GI on 6/19 with removal of pieces of chicken -Remains on PPI as prescribed -Noted to also have some grade a esophagitis -May consider esophageal dilation with repeat EGD in 4 weeks per GI  Hypokalemia/hypomagnesemia -Replete and re-evaluate in am   Hypothermia-resolved -TSH 1.68   Atrial fibrillation -Continue heparin drip, holding Xarelto -Metoprolol IV every 8 hours for heart rate control -Stable   Essential hypertension -Cardizem and Imdur currently held -Low-dose IV metoprolol as above -IV hydralazine prn   Dyslipidemia -Atorvastatin, stable   Frequent falls/dementia/failure to thrive -Palliative having discussions regarding hospice.  -Pending further discussion family not on board yet   Hypernatremia -dehydration -Sodium at 146, potassium 3.8 chloride 112 -Initiating D5 half-normal saline, monitoring oral intake  DVT prophylaxis: Heparin drip Code Status: DNR Family Communication: Spoke with spouse on phone 6/20 Disposition Plan:  Status is: Inpatient   Remains inpatient appropriate because:Altered mental status, IV treatments appropriate due to intensity of illness or inability to take PO, and Inpatient level of care appropriate due to severity of illness   Dispo: The patient is from: Home              Anticipated d/c is to: Home              Patient currently is not medically stable to d/c.              Difficult to place patient No   Consultants:  GI PCCM Palliative care   Procedures:  EGD 6/19 Intubation 6/19  Antimicrobials:  Anti-infectives (From admission, onward)    Start     Dose/Rate Route Frequency Ordered Stop   11/19/20 1945  metroNIDAZOLE (FLAGYL) IVPB 500 mg        500 mg 100 mL/hr over 60 Minutes Intravenous Every 8 hours 11/19/20 1931     11/19/20 1930  aztreonam (AZACTAM) 2 g in  sodium chloride 0.9 % 100 mL IVPB  Status:  Discontinued        2 g 200 mL/hr over 30 Minutes Intravenous Every 8 hours 11/19/20 1921 11/19/20 1926   11/19/20 1930  cefTRIAXone (ROCEPHIN) 2 g in sodium chloride 0.9 % 100 mL IVPB        2 g 200 mL/hr over 30 Minutes Intravenous Every 24 hours 11/19/20 1926         Objective: Vitals:   11/21/20 1830 11/21/20 2040 11/22/20 0422 11/22/20 0444  BP: (!) 156/92 (!) 151/78  (!) 158/110  Pulse: 80 88  92  Resp: (!) 22 16  16   Temp: 98.4 F (36.9 C) 97.9 F (36.6 C)  98.3 F (36.8 C)  TempSrc: Axillary Oral  Oral  SpO2: 100% 93%  97%  Weight: 58.1 kg  57 kg   Height:        Intake/Output Summary (Last 24 hours) at 11/22/2020 1242 Last data filed at 11/22/2020 6160 Gross per 24 hour  Intake 607.35 ml  Output 250 ml  Net 357.35 ml   Filed Weights   11/21/20 0448 11/21/20 1830 11/22/20 0422  Weight: 58.7 kg 58.1 kg 57 kg      Physical Exam:   General:  Cachectic male, generally confused, agitation and soft restraints  HEENT:  Normocephalic, PERRL, otherwise with in Normal limits   Neuro:  CNII-XII intact. , normal motor and sensation, reflexes intact   Lungs:   Clear to auscultation BL, Respirations unlabored, no wheezes / crackles  Cardio:    S1/S2, RRR, No murmure, No Rubs or Gallops   Abdomen:   Soft, non-tender, bowel sounds active all four quadrants,  no guarding or peritoneal signs.  Muscular skeletal:  Generalized weaknesses, Limited exam - in bed, able to move all 4 extremities, Normal strength,  2+ pulses,  symmetric, No pitting edema  Skin:  Dry, warm to touch, negative for any Rashes,  Wounds: Please see nursing documentation          Data Reviewed: I have personally reviewed following labs and imaging studies  CBC: Recent Labs  Lab 11/19/20 1546 11/20/20 0440 11/21/20 0702 11/22/20 0432  WBC 8.3 9.6 10.6* 9.1  NEUTROABS 5.3  --   --   --   HGB 13.2 12.1* 11.2* 11.4*  HCT 44.0 40.4 37.3* 37.5*  MCV  88.4 87.6 87.4 87.6  PLT 214 144* 141* 737   Basic Metabolic Panel: Recent Labs  Lab 11/19/20 1546 11/20/20 0440 11/20/20 1812 11/21/20 0702 11/21/20 1823 11/22/20 0432  NA 140 142  --  143  --  146*  K 3.9 3.5  --  2.9*  --  3.8  CL 103 105  --  109  --  112*  CO2 29 28  --  25  --  26  GLUCOSE 148* 104*  --  84  --  96  BUN 28* 31*  --  34*  --  37*  CREATININE 0.78 1.04  --  0.95  --  0.91  CALCIUM 9.0 9.0  --  8.6*  --  8.8*  MG  --  1.7 1.6*  1.6* 2.4 2.1  PHOS  --  1.7* 2.8 2.5 2.2*  --    GFR: Estimated Creatinine Clearance: 47 mL/min (by C-G formula based on SCr of 0.91 mg/dL). Liver Function Tests: Recent Labs  Lab 11/19/20 1546 11/20/20 0440  AST 23 32  ALT 14 17  ALKPHOS 74 59  BILITOT 0.3 0.6  PROT 7.6 6.2*  ALBUMIN 4.4 3.5   No results for input(s): LIPASE, AMYLASE in the last 168 hours. No results for input(s): AMMONIA in the last 168 hours. Coagulation Profile: Recent Labs  Lab 11/20/20 0440  INR 1.5*   Cardiac Enzymes: No results for input(s): CKTOTAL, CKMB, CKMBINDEX, TROPONINI in the last 168 hours. BNP (last 3 results) No results for input(s): PROBNP in the last 8760 hours. HbA1C: No results for input(s): HGBA1C in the last 72 hours. CBG: Recent Labs  Lab 11/21/20 1117 11/21/20 1712 11/21/20 2119 11/22/20 0909 11/22/20 1100  GLUCAP 79 75 100* 97 87   Lipid Profile: Recent Labs    11/21/20 0703  TRIG 86   Thyroid Function Tests: Recent Labs    11/20/20 0440  TSH 1.681   Anemia Panel: No results for input(s): VITAMINB12, FOLATE, FERRITIN, TIBC, IRON, RETICCTPCT in the last 72 hours. Sepsis Labs: Recent Labs  Lab 11/20/20 0440  PROCALCITON 0.63    Recent Results (from the past 240 hour(s))  Resp Panel by RT-PCR (Flu A&B, Covid) Nasopharyngeal Swab     Status: None   Collection Time: 11/19/20  3:54 PM   Specimen: Nasopharyngeal Swab; Nasopharyngeal(NP) swabs in vial transport medium  Result Value Ref Range Status    SARS Coronavirus 2 by RT PCR NEGATIVE NEGATIVE Final    Comment: (NOTE) SARS-CoV-2 target nucleic acids are NOT DETECTED.  The SARS-CoV-2 RNA is generally detectable in upper respiratory specimens during the acute phase of infection. The lowest concentration of SARS-CoV-2 viral copies this assay can detect is 138 copies/mL. A negative result does not preclude SARS-Cov-2 infection and should not be used as the sole basis for treatment or other patient management decisions. A negative result may occur with  improper specimen collection/handling, submission of specimen other than nasopharyngeal swab, presence of viral mutation(s) within the areas targeted by this assay, and inadequate number of viral copies(<138 copies/mL). A negative result must be combined with clinical observations, patient history, and epidemiological information. The expected result is Negative.  Fact Sheet for Patients:  EntrepreneurPulse.com.au  Fact Sheet for Healthcare Providers:  IncredibleEmployment.be  This test is no t yet approved or cleared by the [place] FDA and  has been authorized for detection and/or diagnosis of SARS-CoV-2 by FDA under an Emergency Use Authorization (EUA). This EUA will remain  in effect (meaning this test can be used) for the duration of the COVID-19 declaration under Section 564(b)(1) of the Act, 21 U.S.C.section 360bbb-3(b)(1), unless the authorization is terminated  or revoked sooner.       Influenza A by PCR NEGATIVE NEGATIVE Final   Influenza B by PCR NEGATIVE NEGATIVE Final    Comment: (NOTE) The Xpert Xpress SARS-CoV-2/FLU/RSV plus assay is intended as an aid in the diagnosis of influenza from Nasopharyngeal swab specimens and should not be used as a sole basis for treatment. Nasal washings and aspirates are unacceptable for Xpert Xpress SARS-CoV-2/FLU/RSV testing.  Fact Sheet for  Patients: EntrepreneurPulse.com.au  Fact Sheet for Healthcare Providers: IncredibleEmployment.be  This test is not yet approved or cleared by the [place] FDA and has been authorized for detection and/or diagnosis  of SARS-CoV-2 by FDA under an Emergency Use Authorization (EUA). This EUA will remain in effect (meaning this test can be used) for the duration of the COVID-19 declaration under Section 564(b)(1) of the Act, 21 U.S.C. section 360bbb-3(b)(1), unless the authorization is terminated or revoked.  Performed at [facility], [address], [place], [place] [address]   MRSA Next Gen by PCR, Nasal     Status: None   Collection Time: 11/19/20  9:40 PM   Specimen: Nasal Mucosa; Nasal Swab  Result Value Ref Range Status   MRSA by PCR Next Gen NOT DETECTED NOT DETECTED Final    Comment: (NOTE) The GeneXpert MRSA Assay (FDA approved for NASAL specimens only), is one component of a comprehensive MRSA colonization surveillance program. It is not intended to diagnose MRSA infection nor to guide or monitor treatment for MRSA infections. Test performance is not FDA approved in patients less than 49 years old. Performed at [facility], [address] [place], [place] [address]   Culture, Urine     Status: None   Collection Time: 11/20/20 11:15 AM   Specimen: Urine, Catheterized  Result Value Ref Range Status   Specimen Description   Final    URINE, CATHETERIZED Performed at [facility], [address] [place], [place] [address]    Special Requests   Final    NONE Performed at [facility], [address] [place], [place] [address]    Culture   Final    NO GROWTH Performed at [facility], [place] [address] [place], [place] [address]    Report Status 11/22/2020 FINAL  Final         Radiology Studies: DG CHEST PORT 1 VIEW  Result Date: 11/20/2020 CLINICAL DATA:  Aspiration EXAM: PORTABLE CHEST 1 VIEW COMPARISON:  11/19/2020  and prior studies FINDINGS: The cardiomediastinal silhouette is unchanged. A large hiatal hernia is present. An endotracheal tube with tip 4.5 cm above the carina and NG tube extending through the intrathoracic hiatal hernia and into the UPPER abdomen noted. LEFT LOWER lung atelectasis is again noted. No pneumothorax identified. IMPRESSION: No significant change. Large hiatal hernia and LEFT LOWER lung atelectasis. Electronically Signed   By: Margarette Canada M.D.   On: 11/20/2020 13:44        Scheduled Meds:  atorvastatin  10 mg Oral QHS   chlorhexidine gluconate (MEDLINE KIT)  15 mL Mouth Rinse BID   Chlorhexidine Gluconate Cloth  6 each Topical Daily   mouth rinse  15 mL Mouth Rinse q12n4p   metoprolol tartrate  2.5 mg Intravenous Q8H   multivitamin with minerals  1 tablet Oral Daily   pantoprazole (PROTONIX) IV  40 mg Intravenous Q12H   Continuous Infusions:  cefTRIAXone (ROCEPHIN)  IV 2 g (11/21/20 2315)   feeding supplement (VITAL AF 1.2 CAL) Stopped (11/20/20 1312)   heparin 1,050 Units/hr (11/22/20 0942)   metronidazole 500 mg (11/22/20 0513)     LOS: 3 days    Time spent: 35 minutes    Isacc Turney A Chelsi Warr, DO [facility]  If 7PM-7AM, please contact night-coverage [email or web address] 11/22/2020, 12:42 PM

## 2020-11-22 NOTE — Progress Notes (Signed)
Nutrition Follow-up  DOCUMENTATION CODES:  Severe malnutrition in context of chronic illness  INTERVENTION:  Advance diet as medically able per SLP.  Add Magic cup TID with meals, each supplement provides 290 kcal and 9 grams of protein.  Add Mighty Shake II TID with meals, each supplement provides 480-500 kcals and 20-23 grams of protein.  Add MVI with minerals daily.  NUTRITION DIAGNOSIS:  Severe Malnutrition related to chronic illness as evidenced by severe fat depletion, severe muscle depletion. - ongoing  GOAL:  Patient will meet greater than or equal to 90% of their needs - not meeting  MONITOR:  Diet advancement, PO intake, Supplement acceptance, Labs, Weight trends, I & O's  REASON FOR ASSESSMENT:  Low Braden, Ventilator    ASSESSMENT:  87 yo male with a PMH of A-fib, HTN, COPD, chronic respiratory failure, dementia, and frequent falls who presents with aspiration PNA. 6/19 - admitted, intubated, EGD to remove chicken pieces that pt came in choking on admit 6/20 - extubated; made DNR 6/21 - SLP recommends D3/nectar  Pt very agitated today. Family still deciding regarding hospice/comfort care - needs a few more days to consider it.  Admit wt: 60.3 kg Current wt: 57 kg  Recommend advancing diet as soon as possible as SLP recommended Dys 3 with nectar thick liquids and pt is already severely malnourished.  Recommend adding Magic Cup TID and Mighty Shakes TID to promote intake and these are already pre-thickened to needed consistency. Also recommend MVI with minerals.  Medications: reviewed; Protonix, ceftriaxone via IV, Flagyl via IV TID  Diet Order:   Diet Order             Diet NPO time specified  Diet effective midnight                  EDUCATION NEEDS:  Not appropriate for education at this time  Skin:  Skin Assessment: Reviewed RN Assessment  Last BM:  11/21/20 - smear  Height:  Ht Readings from Last 1 Encounters:  11/19/20 5\' 11"  (1.803 m)    Weight:  Wt Readings from Last 1 Encounters:  11/22/20 57 kg   Ideal Body Weight:  78.2 kg  BMI:  Body mass index is 17.53 kg/m.  Estimated Nutritional Needs:  Kcal:  1550-1750 Protein:  70-85 grams Fluid:  >1.6 L  Derrel Nip, RD, LDN Registered Dietitian I After-Hours/Weekend Pager # in [place]

## 2020-11-22 NOTE — Progress Notes (Signed)
ANTICOAGULATION CONSULT NOTE - Follow Up Consult  Pharmacy Consult for heparin Indication: atrial fibrillation  Labs: Recent Labs    11/20/20 0440 11/20/20 0824 11/20/20 0824 11/20/20 1812 11/21/20 0702 11/21/20 0703 11/22/20 0432  HGB 12.1*  --   --   --  11.2*  --  11.4*  HCT 40.4  --   --   --  37.3*  --  37.5*  PLT 144*  --   --   --  141*  --  159  APTT  --  59*   < > 118* 63*  --  64*  LABPROT 18.4*  --   --   --   --   --   --   INR 1.5*  --   --   --   --   --   --   HEPARINUNFRC  --  >1.10*  --   --   --  0.61 0.31  CREATININE 1.04  --   --   --  0.95  --  0.91   < > = values in this interval not displayed.    Assessment: 87yo male remains slightly subtherapeutic on heparin after rate change; no gtt issues or signs of bleeding per RN.  Goal of Therapy:  aPTT 66-102 seconds   Plan:  Will increase heparin gtt by 1-2 units/kg/hr to 1050 units/hr and check PTT in 8 hours.    Wynona Neat, PharmD, BCPS  11/22/2020,7:04 AM

## 2020-11-22 NOTE — Progress Notes (Signed)
Subjective: Remains confused.  No family at bedside.  Patient does not have any complaints today. He is alert and resting comfortably in bed.  Can tell me his first and last name, but that is it.  Per nursing staff, they did not attempt to feed patient last night as they were concerned about his altered mental status/lethargy. He has had no vomiting, brbpr, or melena. Small BM over night.   Objective: Vital signs in last 24 hours: Temp:  [97.9 F (36.6 C)-99.68 F (37.6 C)] 98.3 F (36.8 C) (06/22 0444) Pulse Rate:  [66-93] 92 (06/22 0444) Resp:  [16-52] 16 (06/22 0444) BP: (151-173)/(63-110) 158/110 (06/22 0444) SpO2:  [93 %-100 %] 97 % (06/22 0444) Weight:  [57 kg-58.1 kg] 57 kg (06/22 0422) Last BM Date: 11/21/20 (smear) General:   Alert, oriented to self only, chronically ill-appearing, frail, soft restraint on right arm with hand mitts on. Head:  Normocephalic and atraumatic. Abdomen:  Bowel sounds hypoactive soft, non-tender, non-distended. No HSM or hernias noted. No rebound or guarding. No masses appreciated  Extremities:  Without edema. Neurologic:  Alert and  oriented x4;  grossly normal neurologically. Skin:  Warm and dry, intact without significant lesions.  Psych:  Normal mood and affect.  Intake/Output from previous day: 06/21 0701 - 06/22 0700 In: 1214.4 [I.V.:213.6; IV Piggyback:1000.9] Out: 250 [Urine:250] Intake/Output this shift: No intake/output data recorded.  Lab Results: Recent Labs    11/20/20 0440 11/21/20 0702 11/22/20 0432  WBC 9.6 10.6* 9.1  HGB 12.1* 11.2* 11.4*  HCT 40.4 37.3* 37.5*  PLT 144* 141* 159   BMET Recent Labs    11/20/20 0440 11/21/20 0702 11/22/20 0432  NA 142 143 146*  K 3.5 2.9* 3.8  CL 105 109 112*  CO2 28 25 26   GLUCOSE 104* 84 96  BUN 31* 34* 37*  CREATININE 1.04 0.95 0.91  CALCIUM 9.0 8.6* 8.8*   LFT Recent Labs    11/19/20 1546 11/20/20 0440  PROT 7.6 6.2*  ALBUMIN 4.4 3.5  AST 23 32  ALT 14 17   ALKPHOS 74 59  BILITOT 0.3 0.6   PT/INR Recent Labs    11/20/20 0440  LABPROT 18.4*  INR 1.5*    Studies/Results: DG CHEST PORT 1 VIEW  Result Date: 11/20/2020 CLINICAL DATA:  Aspiration EXAM: PORTABLE CHEST 1 VIEW COMPARISON:  11/19/2020 and prior studies FINDINGS: The cardiomediastinal silhouette is unchanged. A large hiatal hernia is present. An endotracheal tube with tip 4.5 cm above the carina and NG tube extending through the intrathoracic hiatal hernia and into the UPPER abdomen noted. LEFT LOWER lung atelectasis is again noted. No pneumothorax identified. IMPRESSION: No significant change. Large hiatal hernia and LEFT LOWER lung atelectasis. Electronically Signed   By: Margarette Canada M.D.   On: 11/20/2020 13:44    Assessment: 87 year old male with multiple medical problems including COPD, HTN, dementia, atrial fibrillation and previous PE on Xarelto, known large hiatal hernia, GERD, erosive reflux esophagitis, and esophageal stricture documented in 2013 who presented to the emergency room with esophageal food impaction while eating chicken with associated respiratory distress and subsequently intubated. Also felt to have aspiration pneumonia and started on IV antibiotics.  Respiratory status stabilized after intubation.  He underwent EGD on 6/19 with successful removal of all food particles. EGD revealed grade A esophagitis, benign-appearing esophageal stenosis, large hiatal hernia with organoaxial rotation of the stomach, erythematous, edematous mucosa in the pylorus.  Unable to dilate due to Xarelto. He was started  on Protonix 40 mg twice daily and Xarelto held as Dr. Laural Golden recommended repeat EGD in 48 hours for possible dilation. Currently on heparin.  Repeat EGD has not been pursued.  Dr. Jenetta Downer recommended holding off on repeat EGD with possible dilation for about 4 weeks to allow esophageal inflammation to improve.  He remains to be seen whether patient will tolerate a diet.   Speech therapy is on board with plans to trial pured diet last night, but nursing staff states due to altered mental status, they were uncomfortable at attempting to feed patient.  Plans for modified barium swallow today.  This will help to evaluate his swallowing capabilities.  Would recommend patient staying on pure or full liquid diet until repeat EGD is pursued.  Notably, palliative care is also been on board with some discussion about transitioning to hospice, but final decision on this has not been made.  Plan: 1.  Consider repeat EGD with possible esophageal dilation in about 4 weeks.   2.  Await modified barium swallow with speech therapy today.  Diet advancement per speech therapy.  Would recommend staying on full liquids and/or pured diet until repeat EGD is pursued.  3.  Continue PPI twice daily.  4.  Given dementia and multiple comorbidities, agree with palliative care discussion and appreciate their input.   LOS: 3 days    11/22/2020, 9:50 AM   Aliene Altes, PA-C [facility]

## 2020-11-23 ENCOUNTER — Encounter (HOSPITAL_COMMUNITY): Payer: Self-pay | Admitting: Internal Medicine

## 2020-11-23 DIAGNOSIS — E43 Unspecified severe protein-calorie malnutrition: Secondary | ICD-10-CM

## 2020-11-23 LAB — GLUCOSE, CAPILLARY
Glucose-Capillary: 118 mg/dL — ABNORMAL HIGH (ref 70–99)
Glucose-Capillary: 119 mg/dL — ABNORMAL HIGH (ref 70–99)
Glucose-Capillary: 124 mg/dL — ABNORMAL HIGH (ref 70–99)
Glucose-Capillary: 155 mg/dL — ABNORMAL HIGH (ref 70–99)
Glucose-Capillary: 156 mg/dL — ABNORMAL HIGH (ref 70–99)
Glucose-Capillary: 96 mg/dL (ref 70–99)

## 2020-11-23 LAB — APTT: aPTT: 84 seconds — ABNORMAL HIGH (ref 24–36)

## 2020-11-23 LAB — BASIC METABOLIC PANEL
Anion gap: 8 (ref 5–15)
BUN: 28 mg/dL — ABNORMAL HIGH (ref 8–23)
CO2: 26 mmol/L (ref 22–32)
Calcium: 8.1 mg/dL — ABNORMAL LOW (ref 8.9–10.3)
Chloride: 109 mmol/L (ref 98–111)
Creatinine, Ser: 0.72 mg/dL (ref 0.61–1.24)
GFR, Estimated: >60 mL/min (ref >60)
Glucose, Bld: 158 mg/dL — ABNORMAL HIGH (ref 70–99)
Potassium: 3.3 mmol/L — ABNORMAL LOW (ref 3.5–5.1)
Sodium: 143 mmol/L (ref 135–145)

## 2020-11-23 LAB — CBC
HCT: 36.4 % — ABNORMAL LOW (ref 39.0–52.0)
Hemoglobin: 10.7 g/dL — ABNORMAL LOW (ref 13.0–17.0)
MCH: 26.2 pg (ref 26.0–34.0)
MCHC: 29.4 g/dL — ABNORMAL LOW (ref 30.0–36.0)
MCV: 89 fL (ref 80.0–100.0)
Platelets: 143 10*3/uL — ABNORMAL LOW (ref 150–400)
RBC: 4.09 MIL/uL — ABNORMAL LOW (ref 4.22–5.81)
RDW: 16 % — ABNORMAL HIGH (ref 11.5–15.5)
WBC: 6.7 10*3/uL (ref 4.0–10.5)
nRBC: 0 % (ref 0.0–0.2)

## 2020-11-23 LAB — HEPARIN LEVEL (UNFRACTIONATED): Heparin Unfractionated: 0.37 IU/mL (ref 0.30–0.70)

## 2020-11-23 LAB — AMMONIA: Ammonia: 31 umol/L (ref 9–35)

## 2020-11-23 MED ORDER — PANTOPRAZOLE SODIUM 40 MG PO TBEC
40.0000 mg | DELAYED_RELEASE_TABLET | Freq: Every day | ORAL | Status: DC
  Administered 2020-11-23: 40 mg via ORAL
  Filled 2020-11-23: qty 1

## 2020-11-23 MED ORDER — CLONAZEPAM 0.25 MG PO TBDP
0.2500 mg | ORAL_TABLET | Freq: Three times a day (TID) | ORAL | Status: DC
  Filled 2020-11-23: qty 1

## 2020-11-23 MED ORDER — METOPROLOL TARTRATE 25 MG PO TABS
12.5000 mg | ORAL_TABLET | Freq: Two times a day (BID) | ORAL | Status: DC
  Administered 2020-11-23: 12.5 mg via ORAL
  Filled 2020-11-23 (×2): qty 1

## 2020-11-23 MED ORDER — RIVAROXABAN 20 MG PO TABS
20.0000 mg | ORAL_TABLET | Freq: Every day | ORAL | Status: DC
  Administered 2020-11-23: 20 mg via ORAL
  Filled 2020-11-23: qty 1

## 2020-11-23 NOTE — Progress Notes (Signed)
PROGRESS NOTE    [name]  LJQ:[id number] DOB: 07-Nov-1932 DOA: 11/19/2020 PCP: [name], MD     Subjective:  The patient was seen and examined this morning, somnolent, not interactive. Much less agitated Status post speech evaluation--not alert enough to take anything p.o. Medication will be switched to p.o. tolerated  Extensive discussion with his wife today 11/23/2020 --- palliative care on board..  All in agreement to proceed with hospice   Brief Narrative:   [name] is an 87 y.o. male with medical history significant for Atria fib on Xarelto, HTN, COPD chronic respiratory failure, dementia and frequent falls who presents to the emergency department via EMS after choking on chicken at home.  He was admitted with acute on chronic hypoxemic and hypercapnic respiratory failure secondary to presumed aspiration pneumonia and had to urgently undergo EGD due to esophageal obstruction with pieces of chicken.  He was intubated for airway protection and has subsequently been extubated on 6/20. Palliative is having discussions with family regarding discharge to either home hospice or residential hospice. SLP planning for MBSS in am, 6/21.  Assessment & Plan:   Principal Problem:   Aspiration pneumonia (HCC) Active Problems:   Hyperlipidemia   Essential hypertension   GERD   Hiatal hernia   Hyperglycemia   Atrial fibrillation with rapid ventricular response (HCC)   Esophageal stricture   COPD (chronic obstructive pulmonary disease) (HCC)   Dementia (HCC)   Frequent falls   Acute on chronic respiratory failure with hypoxia and hypercapnia (HCC)   Hypothermia   Protein-calorie malnutrition, severe   Aspiration into airway   Foreign body in esophagus   Acute on chronic hypoxemic and hypercapnic respiratory failure-multifactorial -Improved, 92% on room air -Noted to be in the setting of esophageal obstruction along with aspiration pneumonia -Continue IV  Rocephin and metronidazole -Appreciate PCCM involvement and patient has now been extubated on 6/20 -Extensive discussion with patient's wife, palliative care following, wife is agreed to pursue with hospice today 11/23/2020    Esophageal obstruction with foreign bodies -Status post EGD by GI on 6/19 with removal of pieces of chicken -Remains on PPI as prescribed -Noted to also have some grade a esophagitis -May consider esophageal dilation with repeat EGD in 4 weeks per GI Dysphagia: Status post speech evaluation, status post modified barium swallow evaluation, recommending Dysphagia 1/pured diet, honey thick liquid, meds crushed, high risk for aspiration  Hypokalemia/hypomagnesemia -Replete and re-evaluate in am   Hypothermia-resolved -TSH 1.68   Atrial fibrillation -Discontinue heparin drip, resuming Xarelto if can tolerate p.o. -Metoprolol IV every 8 hours for heart rate control... Anticipating discontinue -We will restart p.o. metoprolol if he can tolerate p.o.   Essential hypertension -Cardizem and Imdur was on hold, resuming if tolerating p.o. -Low-dose IV metoprolol as above... Discontinue -IV hydralazine prn   Dyslipidemia -Atorvastatin, stable   Frequent falls/dementia/failure to thrive -Palliative having discussions regarding hospice.  -Pending further discussion family not on board yet   Hypernatremia -dehydration -Sodium at 146, potassium 3.8 chloride 112 -Initiating D5 half-normal saline, monitoring oral intake    DVT prophylaxis: Heparin drip Code Status: DNR Family Communication: Spoke with spouse Ms. [name] on phone 11/23/2020 Agreed to pursue with hospice  Disposition Plan:  Status is: Inpatient   Remains inpatient appropriate because:Altered mental status, IV treatments appropriate due to intensity of illness or inability to take PO, and Inpatient level of care appropriate due to severity of illness   Dispo: The patient is from: Home  Anticipated d/c is to: Hospice in 1-2 days               Patient currently is not medically stable to d/c.              Difficult to place patient No   Consultants:  GI PCCM Palliative care   Procedures:  EGD 6/19 Intubation 6/19  Antimicrobials:  Anti-infectives (From admission, onward)    Start     Dose/Rate Route Frequency Ordered Stop   11/19/20 1945  metroNIDAZOLE (FLAGYL) IVPB 500 mg        500 mg 100 mL/hr over 60 Minutes Intravenous Every 8 hours 11/19/20 1931     11/19/20 1930  aztreonam (AZACTAM) 2 g in sodium chloride 0.9 % 100 mL IVPB  Status:  Discontinued        2 g 200 mL/hr over 30 Minutes Intravenous Every 8 hours 11/19/20 1921 11/19/20 1926   11/19/20 1930  cefTRIAXone (ROCEPHIN) 2 g in sodium chloride 0.9 % 100 mL IVPB        2 g 200 mL/hr over 30 Minutes Intravenous Every 24 hours 11/19/20 1926         Objective: Vitals:   11/22/20 0444 11/22/20 1408 11/22/20 2122 11/23/20 0350  BP: (!) 158/110 (!) 152/81 (!) 185/85 (!) 150/78  Pulse: 92 88 95 68  Resp: 16 16 20  (!) 22  Temp: 98.3 F (36.8 C) 98.6 F (37 C) (!) 97.5 F (36.4 C) 97.9 F (36.6 C)  TempSrc: Oral Oral Axillary Oral  SpO2: 97% 97% 99%   Weight:      Height:        Intake/Output Summary (Last 24 hours) at 11/23/2020 1113 Last data filed at 11/23/2020 0500 Gross per 24 hour  Intake 2184.24 ml  Output 1350 ml  Net 834.24 ml   Filed Weights   11/21/20 0448 11/21/20 1830 11/22/20 0422  Weight: 58.7 kg 58.1 kg 57 kg       Physical Exam:   General:  Reduced mentation, somnolent, cachectic elderly male  HEENT:  Normocephalic, PERRL, otherwise with in Normal limits   Neuro:  CNII-XII intact. , normal motor and sensation, reflexes intact   Lungs:   Clear to auscultation BL, Respirations unlabored, no wheezes / crackles  Cardio:    S1/S2, RRR, No murmure, No Rubs or Gallops   Abdomen:   Soft, non-tender, bowel sounds active all four quadrants,  no guarding or peritoneal  signs.  Muscular skeletal:  Sever gen. Weakness, Limited exam - in bed, able to move all 4 extremities, Normal strength,  2+ pulses,  symmetric, No pitting edema  Skin:  Dry, warm to touch, negative for any Rashes,  Wounds: Please see nursing documentation              Data Reviewed: I have personally reviewed following labs and imaging studies  CBC: Recent Labs  Lab 11/19/20 1546 11/20/20 0440 11/21/20 0702 11/22/20 0432 11/23/20 0447  WBC 8.3 9.6 10.6* 9.1 6.7  NEUTROABS 5.3  --   --   --   --   HGB 13.2 12.1* 11.2* 11.4* 10.7*  HCT 44.0 40.4 37.3* 37.5* 36.4*  MCV 88.4 87.6 87.4 87.6 89.0  PLT 214 144* 141* 159 294*   Basic Metabolic Panel: Recent Labs  Lab 11/19/20 1546 11/20/20 0440 11/20/20 1812 11/21/20 0702 11/21/20 1823 11/22/20 0432 11/23/20 0447  NA 140 142  --  143  --  146* 143  K 3.9 3.5  --  2.9*  --  3.8 3.3*  CL 103 105  --  109  --  112* 109  CO2 29 28  --  25  --  26 26  GLUCOSE 148* 104*  --  84  --  96 158*  BUN 28* 31*  --  34*  --  37* 28*  CREATININE 0.78 1.04  --  0.95  --  0.91 0.72  CALCIUM 9.0 9.0  --  8.6*  --  8.8* 8.1*  MG  --  1.7 1.6* 1.6* 2.4 2.1  --   PHOS  --  1.7* 2.8 2.5 2.2*  --   --    GFR: Estimated Creatinine Clearance: 53.4 mL/min (by C-G formula based on SCr of 0.72 mg/dL). Liver Function Tests: Recent Labs  Lab 11/19/20 1546 11/20/20 0440  AST 23 32  ALT 14 17  ALKPHOS 74 59  BILITOT 0.3 0.6  PROT 7.6 6.2*  ALBUMIN 4.4 3.5   No results for input(s): LIPASE, AMYLASE in the last 168 hours. No results for input(s): AMMONIA in the last 168 hours. Coagulation Profile: Recent Labs  Lab 11/20/20 0440  INR 1.5*   Cardiac Enzymes: No results for input(s): CKTOTAL, CKMB, CKMBINDEX, TROPONINI in the last 168 hours. BNP (last 3 results) No results for input(s): PROBNP in the last 8760 hours. HbA1C: No results for input(s): HGBA1C in the last 72 hours. CBG: Recent Labs  Lab 11/22/20 2127 11/23/20 0051  11/23/20 0353 11/23/20 0744 11/23/20 1109  GLUCAP 143* 155* 156* 124* 118*   Lipid Profile: Recent Labs    11/21/20 0703  TRIG 86   Thyroid Function Tests: No results for input(s): TSH, T4TOTAL, FREET4, T3FREE, THYROIDAB in the last 72 hours.  Anemia Panel: No results for input(s): VITAMINB12, FOLATE, FERRITIN, TIBC, IRON, RETICCTPCT in the last 72 hours. Sepsis Labs: Recent Labs  Lab 11/20/20 0440  PROCALCITON 0.63    Recent Results (from the past 240 hour(s))  Resp Panel by RT-PCR (Flu A&B, Covid) Nasopharyngeal Swab     Status: None   Collection Time: 11/19/20  3:54 PM   Specimen: Nasopharyngeal Swab; Nasopharyngeal(NP) swabs in vial transport medium  Result Value Ref Range Status   SARS Coronavirus 2 by RT PCR NEGATIVE NEGATIVE Final    Comment: (NOTE) SARS-CoV-2 target nucleic acids are NOT DETECTED.  The SARS-CoV-2 RNA is generally detectable in upper respiratory specimens during the acute phase of infection. The lowest concentration of SARS-CoV-2 viral copies this assay can detect is 138 copies/mL. A negative result does not preclude SARS-Cov-2 infection and should not be used as the sole basis for treatment or other patient management decisions. A negative result may occur with  improper specimen collection/handling, submission of specimen other than nasopharyngeal swab, presence of viral mutation(s) within the areas targeted by this assay, and inadequate number of viral copies(<138 copies/mL). A negative result must be combined with clinical observations, patient history, and epidemiological information. The expected result is Negative.  Fact Sheet for Patients:  EntrepreneurPulse.com.au  Fact Sheet for Healthcare Providers:  IncredibleEmployment.be  This test is no t yet approved or cleared by the [place] FDA and  has been authorized for detection and/or diagnosis of SARS-CoV-2 by FDA under an Emergency Use  Authorization (EUA). This EUA will remain  in effect (meaning this test can be used) for the duration of the COVID-19 declaration under Section 564(b)(1) of the Act, 21 U.S.C.section 360bbb-3(b)(1), unless the authorization is terminated  or revoked sooner.  Influenza A by PCR NEGATIVE NEGATIVE Final   Influenza B by PCR NEGATIVE NEGATIVE Final    Comment: (NOTE) The Xpert Xpress SARS-CoV-2/FLU/RSV plus assay is intended as an aid in the diagnosis of influenza from Nasopharyngeal swab specimens and should not be used as a sole basis for treatment. Nasal washings and aspirates are unacceptable for Xpert Xpress SARS-CoV-2/FLU/RSV testing.  Fact Sheet for Patients: EntrepreneurPulse.com.au  Fact Sheet for Healthcare Providers: IncredibleEmployment.be  This test is not yet approved or cleared by the [place] FDA and has been authorized for detection and/or diagnosis of SARS-CoV-2 by FDA under an Emergency Use Authorization (EUA). This EUA will remain in effect (meaning this test can be used) for the duration of the COVID-19 declaration under Section 564(b)(1) of the Act, 21 U.S.C. section 360bbb-3(b)(1), unless the authorization is terminated or revoked.  Performed at [facility], [address], [place], [place] [address]   MRSA Next Gen by PCR, Nasal     Status: None   Collection Time: 11/19/20  9:40 PM   Specimen: Nasal Mucosa; Nasal Swab  Result Value Ref Range Status   MRSA by PCR Next Gen NOT DETECTED NOT DETECTED Final    Comment: (NOTE) The GeneXpert MRSA Assay (FDA approved for NASAL specimens only), is one component of a comprehensive MRSA colonization surveillance program. It is not intended to diagnose MRSA infection nor to guide or monitor treatment for MRSA infections. Test performance is not FDA approved in patients less than 65 years old. Performed at [facility], [address] [place], [name] [address]    Culture, Urine     Status: None   Collection Time: 11/20/20 11:15 AM   Specimen: Urine, Catheterized  Result Value Ref Range Status   Specimen Description   Final    URINE, CATHETERIZED Performed at [facility], [address], [place], [place] [address]    Special Requests   Final    NONE Performed at [facility], [address], [place], [place] [address]    Culture   Final    NO GROWTH Performed at [facility], [place] [address] [place], [place] [address]    Report Status 11/22/2020 FINAL  Final         Radiology Studies: DG Swallowing Func-Speech Pathology  Result Date: 11/22/2020 Formatting of this result is different from the original. Objective Swallowing Evaluation: Type of Study: MBS-Modified Barium Swallow Study  Patient Details Name: ADVAIT BUICE MRN: [id number] Date of Birth: Sep 07, 1932 Today's Date: 11/22/2020 Time: SLP Start Time (ACUTE ONLY): 55 -SLP Stop Time (ACUTE ONLY): 1257 SLP Time Calculation (min) (ACUTE ONLY): 27 min Past Medical History: Past Medical History: Diagnosis Date  Anemia   ASD (atrial septal defect)   a. s/p repair 1983.  BPH (benign prostatic hyperplasia)   Cerebrovascular accident ([place]) 1990  Chronic bronchitis ([place])   a. h/o resp failure in setting of bronchitis vs PNA 05/2012.  Chronic diarrhea   COPD (chronic obstructive pulmonary disease) (HCC)   Coronary artery disease   a. Nonobstructive by cath 2009 and 09/06/14  Depression   Dysphagia, unspecified(787.20)   Erosive gastritis   H/o GIB felt secondary to this  Esophagitis   Falls frequently   Gastric polyp   Gastric ulcer   GERD (gastroesophageal reflux disease)   Hearing impairment   Hiatal hernia   History of upper gastrointestinal hemorrhage   dr. Maurene Capes, GI  Hyperlipidemia   Hypertension   Internal hemorrhoids   LV dysfunction  a. EF 45-50% by echo 05/2012.  Permanent atrial fibrillation (HCC)   INR followed at [facility].  Vision problems  Past Surgical History: Past Surgical History:  Procedure Laterality Date  ASD REPAIR  1983  CARDIOVERSION    possibly 3 times, dr. brody/cooper  CHOLECYSTECTOMY  1990s  ENTEROSCOPY  05/13/2012  Procedure: ENTEROSCOPY;  Surgeon: Inda Castle, MD;  Location: [facility] ENDOSCOPY;  Service: Endoscopy;  Laterality: N/A;  INGUINAL HERNIA REPAIR  1963  LEFT HEART CATHETERIZATION WITH CORONARY ANGIOGRAM N/A 09/06/2014  Procedure: LEFT HEART CATHETERIZATION WITH CORONARY ANGIOGRAM;  Surgeon: Jettie Booze, MD;  Location: [facility] CATH LAB;  Service: Cardiovascular;  Laterality: N/A;  partial small bowel resection  2005  ischemic? HPI: KAMAURY CUTBIRTH is an 87 y.o. male with medical history significant for Atria fib on Xarelto, HTN, COPD chronic respiratory failure, dementia and frequent falls who presents to the emergency department via EMS after choking on chicken at home.  He was admitted with acute on chronic hypoxemic and hypercapnic respiratory failure secondary to presumed aspiration pneumonia and had to urgently undergo EGD due to esophageal obstruction with pieces of chicken.  He was intubated for airway protection and has subsequently been extubated on 6/20.  No data recorded Assessment / Plan / Recommendation CHL IP CLINICAL IMPRESSIONS 11/22/2020 Clinical Impression Pt presents with moderate/severe oropharyngeal sensorimotor dysphagia characterized by silent aspiration of thin liquids and NTL; swallowing is characterized by gross pharyngeal weakness and pharyngeal residue of all consistencies and textures. Please note visualization was limited by Pt positioning despite max cues and tactile support to reposition throughout exam. NTL did not appear to be initially aspirated, however, mild/mod residue in the valleculae, lateral channels and pyriforms after the swallow was eventually aspirated and visualized on the posterior wall of the trachea following the swallow. Pt was unable to produce a cued cough to clear aspirates. Mild valleculae and pyriform residue was noted  with HTL and puree textures, however no penetration or aspiraiton was visualized with these textures. Pt further demonstrates reduced oral control (was unable to masticate regular trial - was eventually expectorated), reduced epiglottic deflection, decreased pharyngeal peristalsis, decreased laryngeal vestibule closure and decreased BOT retraction. Recommend continue with D1/puree diet and downgrade Pt's diet to HONEY thick liquids; Recommend meds to be crushed in puree. Pt is still at high risk for aspiration even on this least restrictive diet and further note he is also at risk for malnutrition and dehydration. ST will continue to follow for education and goals of care, thank you SLP Visit Diagnosis Dysphagia, unspecified (R13.10) Attention and concentration deficit following -- Frontal lobe and executive function deficit following -- Impact on safety and function Moderate aspiration risk;Severe aspiration risk;Risk for inadequate nutrition/hydration   CHL IP TREATMENT RECOMMENDATION 11/22/2020 Treatment Recommendations Therapy as outlined in treatment plan below   Prognosis 11/22/2020 Prognosis for Safe Diet Advancement Fair Barriers to Reach Goals -- Barriers/Prognosis Comment -- CHL IP DIET RECOMMENDATION 11/22/2020 SLP Diet Recommendations Dysphagia 1 (Puree) solids;Honey thick liquids Liquid Administration via Straw;Cup Medication Administration Crushed with puree Compensations Minimize environmental distractions;Slow rate;Small sips/bites;Clear throat intermittently;Follow solids with liquid Postural Changes Remain semi-upright after after feeds/meals (Comment)   CHL IP OTHER RECOMMENDATIONS 11/22/2020 Recommended Consults -- Oral Care Recommendations Oral care BID Other Recommendations Order thickener from pharmacy;Clarify dietary restrictions;Remove water pitcher;Prohibited food (jello, ice cream, thin soups)   CHL IP FOLLOW UP RECOMMENDATIONS 11/22/2020 Follow up Recommendations Skilled Nursing facility;24  hour supervision/assistance   CHL IP FREQUENCY AND DURATION 11/22/2020 Speech  Therapy Frequency (ACUTE ONLY) min 2x/week Treatment Duration 1 week      CHL IP ORAL PHASE 11/22/2020 Oral Phase Impaired Oral - Pudding Teaspoon -- Oral - Pudding Cup -- Oral - Honey Teaspoon NT Oral - Honey Cup Piecemeal swallowing;Delayed oral transit;Lingual pumping;Holding of bolus Oral - Nectar Teaspoon NT Oral - Nectar Cup Piecemeal swallowing;Delayed oral transit;Lingual pumping;Holding of bolus Oral - Nectar Straw NT Oral - Thin Teaspoon Piecemeal swallowing;Delayed oral transit;Lingual pumping;Holding of bolus Oral - Thin Cup Piecemeal swallowing;Delayed oral transit;Lingual pumping;Holding of bolus Oral - Thin Straw Piecemeal swallowing;Delayed oral transit;Lingual pumping;Holding of bolus Oral - Puree Decreased bolus cohesion;Lingual pumping;Holding of bolus Oral - Mech Soft NT Oral - Regular Decreased bolus cohesion;Lingual pumping;Holding of bolus Oral - Multi-Consistency NT Oral - Pill -- Oral Phase - Comment --  CHL IP PHARYNGEAL PHASE 11/22/2020 Pharyngeal Phase Impaired Pharyngeal- Pudding Teaspoon -- Pharyngeal -- Pharyngeal- Pudding Cup -- Pharyngeal -- Pharyngeal- Honey Teaspoon NT Pharyngeal -- Pharyngeal- Honey Cup Reduced pharyngeal peristalsis;Reduced epiglottic inversion;Reduced laryngeal elevation;Pharyngeal residue - valleculae;Pharyngeal residue - pyriform Pharyngeal -- Pharyngeal- Nectar Teaspoon NT Pharyngeal -- Pharyngeal- Nectar Cup Delayed swallow initiation-pyriform sinuses;Reduced pharyngeal peristalsis;Reduced epiglottic inversion;Reduced anterior laryngeal mobility;Reduced laryngeal elevation;Penetration/Apiration after swallow;Trace aspiration;Pharyngeal residue - valleculae;Pharyngeal residue - pyriform;Lateral channel residue Pharyngeal Material enters airway, passes BELOW cords without attempt by patient to eject out (silent aspiration) Pharyngeal- Nectar Straw NT Pharyngeal -- Pharyngeal- Thin  Teaspoon Delayed swallow initiation-pyriform sinuses;Reduced pharyngeal peristalsis;Reduced epiglottic inversion;Reduced anterior laryngeal mobility;Reduced tongue base retraction;Pharyngeal residue - valleculae;Pharyngeal residue - pyriform Pharyngeal Material does not enter airway Pharyngeal- Thin Cup Delayed swallow initiation-pyriform sinuses;Reduced epiglottic inversion;Reduced anterior laryngeal mobility;Reduced laryngeal elevation;Penetration/Aspiration during swallow;Penetration/Apiration after swallow;Trace aspiration;Pharyngeal residue - valleculae;Pharyngeal residue - pyriform Pharyngeal Material enters airway, passes BELOW cords without attempt by patient to eject out (silent aspiration) Pharyngeal- Thin Straw Delayed swallow initiation-pyriform sinuses;Reduced pharyngeal peristalsis;Reduced epiglottic inversion;Reduced anterior laryngeal mobility;Reduced laryngeal elevation;Reduced airway/laryngeal closure;Moderate aspiration;Pharyngeal residue - valleculae;Pharyngeal residue - pyriform;Lateral channel residue;Penetration/Aspiration during swallow;Penetration/Apiration after swallow Pharyngeal Material enters airway, passes BELOW cords without attempt by patient to eject out (silent aspiration) Pharyngeal- Puree Delayed swallow initiation-pyriform sinuses;Pharyngeal residue - valleculae;Pharyngeal residue - pyriform;Reduced anterior laryngeal mobility;Reduced laryngeal elevation;Reduced epiglottic inversion Pharyngeal Material does not enter airway Pharyngeal- Mechanical Soft NT Pharyngeal -- Pharyngeal- Regular Pharyngeal residue - valleculae;Pharyngeal residue - pyriform;Reduced airway/laryngeal closure;Reduced laryngeal elevation;Reduced tongue base retraction;Reduced anterior laryngeal mobility;Reduced epiglottic inversion Pharyngeal Material does not enter airway Pharyngeal- Multi-consistency NT Pharyngeal -- Pharyngeal- Pill NT Pharyngeal -- Pharyngeal Comment --  CHL IP CERVICAL ESOPHAGEAL PHASE  11/22/2020 Cervical Esophageal Phase WFL Pudding Teaspoon -- Pudding Cup -- Honey Teaspoon -- Honey Cup -- Nectar Teaspoon -- Nectar Cup -- Nectar Straw -- Thin Teaspoon -- Thin Cup -- Thin Straw -- Puree -- Mechanical Soft -- Regular -- Multi-consistency -- Pill -- Cervical Esophageal Comment -- Amelia H. Roddie Mc, CCC-SLP Speech Language Pathologist Wende Bushy 11/22/2020, 2:52 PM                   Scheduled Meds:  atorvastatin  10 mg Oral QHS   chlorhexidine  15 mL Mouth Rinse BID   chlorhexidine gluconate (MEDLINE KIT)  15 mL Mouth Rinse BID   Chlorhexidine Gluconate Cloth  6 each Topical Daily   clonazePAM  0.25 mg Oral TID   mouth rinse  15 mL Mouth Rinse q12n4p   mouth rinse  15 mL Mouth Rinse q12n4p   megestrol  400 mg Oral BID   metoprolol tartrate  12.5 mg  Oral BID   multivitamin with minerals  1 tablet Oral Daily   pantoprazole  40 mg Oral Daily   rivaroxaban  20 mg Oral Q supper   Continuous Infusions:  cefTRIAXone (ROCEPHIN)  IV 2 g (11/22/20 2243)   dextrose 5 % and 0.45% NaCl 75 mL/hr at 11/23/20 1058   feeding supplement (VITAL AF 1.2 CAL) Stopped (11/20/20 1312)   metronidazole 500 mg (11/23/20 0538)     LOS: 4 days    Time spent: 35 minutes    Tramaine Sauls A Obinna Ehresman, DO [facility]  If 7PM-7AM, please contact night-coverage [email or web address] 11/23/2020, 11:13 AM

## 2020-11-23 NOTE — Progress Notes (Signed)
Subjective: Worsening mental status today. Unable to tell me his name today. Denies pain. Shakes his head no to abdominal pain, nausea, vomiting. Nurse reports 1 documented BM in 6/21. No reports of brbpr or melena.   Spoke with Dr. Roger Shelter.  Plan to proceed with hospice care.  Objective: Vital signs in last 24 hours: Temp:  [97.5 F (36.4 C)-98.6 F (37 C)] 97.8 F (36.6 C) (06/23 1132) Pulse Rate:  [68-95] 90 (06/23 1132) Resp:  [16-22] 18 (06/23 1132) BP: (150-185)/(77-110) 164/110 (06/23 1132) SpO2:  [92 %-99 %] 92 % (06/23 1132) Last BM Date: 11/21/20 General:  Frail, chronically ill appearing, looks at me when I call his name, but no verbal responses today. Shakes his head at times to answer questions but not consistently.  Lungs: Tachypneic.  Anterior breath sounds clear to auscultation. Abdomen:  Bowel sounds present, soft, non-tender, non-distended. No HSM or hernias noted. No rebound or guarding. No masses appreciated  Extremities:  Without edema. Neurologic:  Alert, but unable to assess orientation.   Intake/Output from previous day: 06/22 0701 - 06/23 0700 In: 2184.2 [P.O.:120; I.V.:1464.1; IV Piggyback:600.2] Out: 1350 [Urine:1350] Intake/Output this shift: No intake/output data recorded.  Lab Results: Recent Labs    11/21/20 0702 11/22/20 0432 11/23/20 0447  WBC 10.6* 9.1 6.7  HGB 11.2* 11.4* 10.7*  HCT 37.3* 37.5* 36.4*  PLT 141* 159 143*   BMET Recent Labs    11/21/20 0702 11/22/20 0432 11/23/20 0447  NA 143 146* 143  K 2.9* 3.8 3.3*  CL 109 112* 109  CO2 25 26 26   GLUCOSE 84 96 158*  BUN 34* 37* 28*  CREATININE 0.95 0.91 0.72  CALCIUM 8.6* 8.8* 8.1*    Studies/Results: DG Swallowing Func-Speech Pathology  Result Date: 11/22/2020 Formatting of this result is different from the original. Objective Swallowing Evaluation: Type of Study: MBS-Modified Barium Swallow Study  Patient Details Name: [name] MRN: [id number] Date of Birth:  1933/02/07 Today's Date: 11/22/2020 Time: SLP Start Time (ACUTE ONLY): 51 -SLP Stop Time (ACUTE ONLY): 1257 SLP Time Calculation (min) (ACUTE ONLY): 27 min Past Medical History: Past Medical History: Diagnosis Date  Anemia   ASD (atrial septal defect)   a. s/p repair 1983.  BPH (benign prostatic hyperplasia)   Cerebrovascular accident ([place]) 1990  Chronic bronchitis ([place])   a. h/o resp failure in setting of bronchitis vs PNA 05/2012.  Chronic diarrhea   COPD (chronic obstructive pulmonary disease) (HCC)   Coronary artery disease   a. Nonobstructive by cath 2009 and 09/06/14  Depression   Dysphagia, unspecified(787.20)   Erosive gastritis   H/o GIB felt secondary to this  Esophagitis   Falls frequently   Gastric polyp   Gastric ulcer   GERD (gastroesophageal reflux disease)   Hearing impairment   Hiatal hernia   History of upper gastrointestinal hemorrhage   dr. Maurene Capes, GI  Hyperlipidemia   Hypertension   Internal hemorrhoids   LV dysfunction   a. EF 45-50% by echo 05/2012.  Permanent atrial fibrillation (HCC)   INR followed at [facility].  Vision problems  Past Surgical History: Past Surgical History: Procedure Laterality Date  ASD REPAIR  1983  CARDIOVERSION    possibly 3 times, dr. brody/cooper  CHOLECYSTECTOMY  1990s  ENTEROSCOPY  05/13/2012  Procedure: ENTEROSCOPY;  Surgeon: Inda Castle, MD;  Location: [facility] ENDOSCOPY;  Service: Endoscopy;  Laterality: N/A;  INGUINAL HERNIA REPAIR  1963  LEFT HEART CATHETERIZATION WITH CORONARY ANGIOGRAM N/A 09/06/2014  Procedure: LEFT  HEART CATHETERIZATION WITH CORONARY ANGIOGRAM;  Surgeon: Jettie Booze, MD;  Location: [facility] C[facility] LAB;  Service: Cardiovascular;  Laterality: N/A;  partial small bowel resection  2005  ischemic? HPI: [name] is an 87 y.o. male with medical history significant for [place] fib on Xarelto, HTN, COPD chronic respiratory failure, dementia and frequent falls who presents to the emergency department via EMS after choking on chicken at home.  He was admitted  with acute on chronic hypoxemic and hypercapnic respiratory failure secondary to presumed aspiration pneumonia and had to urgently undergo EGD due to esophageal obstruction with pieces of chicken.  He was intubated for airway protection and has subsequently been extubated on 6/20.  No data recorded Assessment / Plan / Recommendation CHL IP CLINICAL IMPRESSIONS 11/22/2020 Clinical Impression Pt presents with moderate/severe oropharyngeal sensorimotor dysphagia characterized by silent aspiration of thin liquids and NTL; swallowing is characterized by gross pharyngeal weakness and pharyngeal residue of all consistencies and textures. Please note visualization was limited by Pt positioning despite max cues and tactile support to reposition throughout exam. NTL did not appear to be initially aspirated, however, mild/mod residue in the valleculae, lateral channels and pyriforms after the swallow was eventually aspirated and visualized on the posterior wall of the trachea following the swallow. Pt was unable to produce a cued cough to clear aspirates. Mild valleculae and pyriform residue was noted with HTL and puree textures, however no penetration or aspiraiton was visualized with these textures. Pt further demonstrates reduced oral control (was unable to masticate regular trial - was eventually expectorated), reduced epiglottic deflection, decreased pharyngeal peristalsis, decreased laryngeal vestibule closure and decreased BOT retraction. Recommend continue with D1/puree diet and downgrade Pt's diet to HONEY thick liquids; Recommend meds to be crushed in puree. Pt is still at high risk for aspiration even on this least restrictive diet and further note he is also at risk for malnutrition and dehydration. ST will continue to follow for education and goals of care, thank you SLP Visit Diagnosis Dysphagia, unspecified (R13.10) Attention and concentration deficit following -- Frontal lobe and executive function deficit  following -- Impact on safety and function Moderate aspiration risk;Severe aspiration risk;Risk for inadequate nutrition/hydration   CHL IP TREATMENT RECOMMENDATION 11/22/2020 Treatment Recommendations Therapy as outlined in treatment plan below   Prognosis 11/22/2020 Prognosis for Safe Diet Advancement Fair Barriers to Reach Goals -- Barriers/Prognosis Comment -- CHL IP DIET RECOMMENDATION 11/22/2020 SLP Diet Recommendations Dysphagia 1 (Puree) solids;Honey thick liquids Liquid Administration via Straw;Cup Medication Administration Crushed with puree Compensations Minimize environmental distractions;Slow rate;Small sips/bites;Clear throat intermittently;Follow solids with liquid Postural Changes Remain semi-upright after after feeds/meals (Comment)   CHL IP OTHER RECOMMENDATIONS 11/22/2020 Recommended Consults -- Oral Care Recommendations Oral care BID Other Recommendations Order thickener from pharmacy;Clarify dietary restrictions;Remove water pitcher;Prohibited food (jello, ice cream, thin soups)   CHL IP FOLLOW UP RECOMMENDATIONS 11/22/2020 Follow up Recommendations Skilled Nursing facility;24 hour supervision/assistance   CHL IP FREQUENCY AND DURATION 11/22/2020 Speech Therapy Frequency (ACUTE ONLY) min 2x/week Treatment Duration 1 week      CHL IP ORAL PHASE 11/22/2020 Oral Phase Impaired Oral - Pudding Teaspoon -- Oral - Pudding Cup -- Oral - Honey Teaspoon NT Oral - Honey Cup Piecemeal swallowing;Delayed oral transit;Lingual pumping;Holding of bolus Oral - Nectar Teaspoon NT Oral - Nectar Cup Piecemeal swallowing;Delayed oral transit;Lingual pumping;Holding of bolus Oral - Nectar Straw NT Oral - Thin Teaspoon Piecemeal swallowing;Delayed oral transit;Lingual pumping;Holding of bolus Oral - Thin Cup Piecemeal swallowing;Delayed oral transit;Lingual pumping;Holding of  bolus Oral - Thin Straw Piecemeal swallowing;Delayed oral transit;Lingual pumping;Holding of bolus Oral - Puree Decreased bolus cohesion;Lingual  pumping;Holding of bolus Oral - Mech Soft NT Oral - Regular Decreased bolus cohesion;Lingual pumping;Holding of bolus Oral - Multi-Consistency NT Oral - Pill -- Oral Phase - Comment --  CHL IP PHARYNGEAL PHASE 11/22/2020 Pharyngeal Phase Impaired Pharyngeal- Pudding Teaspoon -- Pharyngeal -- Pharyngeal- Pudding Cup -- Pharyngeal -- Pharyngeal- Honey Teaspoon NT Pharyngeal -- Pharyngeal- Honey Cup Reduced pharyngeal peristalsis;Reduced epiglottic inversion;Reduced laryngeal elevation;Pharyngeal residue - valleculae;Pharyngeal residue - pyriform Pharyngeal -- Pharyngeal- Nectar Teaspoon NT Pharyngeal -- Pharyngeal- Nectar Cup Delayed swallow initiation-pyriform sinuses;Reduced pharyngeal peristalsis;Reduced epiglottic inversion;Reduced anterior laryngeal mobility;Reduced laryngeal elevation;Penetration/Apiration after swallow;Trace aspiration;Pharyngeal residue - valleculae;Pharyngeal residue - pyriform;Lateral channel residue Pharyngeal Material enters airway, passes BELOW cords without attempt by patient to eject out (silent aspiration) Pharyngeal- Nectar Straw NT Pharyngeal -- Pharyngeal- Thin Teaspoon Delayed swallow initiation-pyriform sinuses;Reduced pharyngeal peristalsis;Reduced epiglottic inversion;Reduced anterior laryngeal mobility;Reduced tongue base retraction;Pharyngeal residue - valleculae;Pharyngeal residue - pyriform Pharyngeal Material does not enter airway Pharyngeal- Thin Cup Delayed swallow initiation-pyriform sinuses;Reduced epiglottic inversion;Reduced anterior laryngeal mobility;Reduced laryngeal elevation;Penetration/Aspiration during swallow;Penetration/Apiration after swallow;Trace aspiration;Pharyngeal residue - valleculae;Pharyngeal residue - pyriform Pharyngeal Material enters airway, passes BELOW cords without attempt by patient to eject out (silent aspiration) Pharyngeal- Thin Straw Delayed swallow initiation-pyriform sinuses;Reduced pharyngeal peristalsis;Reduced epiglottic  inversion;Reduced anterior laryngeal mobility;Reduced laryngeal elevation;Reduced airway/laryngeal closure;Moderate aspiration;Pharyngeal residue - valleculae;Pharyngeal residue - pyriform;Lateral channel residue;Penetration/Aspiration during swallow;Penetration/Apiration after swallow Pharyngeal Material enters airway, passes BELOW cords without attempt by patient to eject out (silent aspiration) Pharyngeal- Puree Delayed swallow initiation-pyriform sinuses;Pharyngeal residue - valleculae;Pharyngeal residue - pyriform;Reduced anterior laryngeal mobility;Reduced laryngeal elevation;Reduced epiglottic inversion Pharyngeal Material does not enter airway Pharyngeal- Mechanical Soft NT Pharyngeal -- Pharyngeal- Regular Pharyngeal residue - valleculae;Pharyngeal residue - pyriform;Reduced airway/laryngeal closure;Reduced laryngeal elevation;Reduced tongue base retraction;Reduced anterior laryngeal mobility;Reduced epiglottic inversion Pharyngeal Material does not enter airway Pharyngeal- Multi-consistency NT Pharyngeal -- Pharyngeal- Pill NT Pharyngeal -- Pharyngeal Comment --  CHL IP CERVICAL ESOPHAGEAL PHASE 11/22/2020 Cervical Esophageal Phase WFL Pudding Teaspoon -- Pudding Cup -- Honey Teaspoon -- Honey Cup -- Nectar Teaspoon -- Nectar Cup -- Nectar Straw -- Thin Teaspoon -- Thin Cup -- Thin Straw -- Puree -- Mechanical Soft -- Regular -- Multi-consistency -- Pill -- Cervical Esophageal Comment -- Amelia H. Roddie Mc, CCC-SLP Speech Language Pathologist Wende Bushy 11/22/2020, 2:52 PM               Assessment: 87 year old male with multiple medical problems including COPD, HTN, dementia, atrial fibrillation and previous PE on Xarelto, known large hiatal hernia, GERD, erosive reflux esophagitis, and esophageal stricture documented in 2013 who presented to the emergency room with esophageal food impaction while eating chicken with associated respiratory distress and subsequently intubated. Also felt to have  aspiration pneumonia and started on IV antibiotics.  Respiratory status stabilized after intubation.  He underwent EGD on 6/19 with successful removal of all food particles. EGD revealed grade A esophagitis, benign-appearing esophageal stenosis, large hiatal hernia with organoaxial rotation of the stomach, erythematous, edematous mucosa in the pylorus.  Unable to dilate due to Xarelto. He was started on Protonix 40 mg twice daily and Xarelto held as Dr. Laural Golden recommended repeat EGD in 48 hours.  Ultimately, it was decided to postpone repeat EGD for at least 4 weeks to allow for esophageal inflammation to improve.  Completed MBSS yesterday with speech therapy and was found to have moderate/severe oropharyngeal sensorimotor dysphagia characterized by silent aspiration of thin liquids  and NTL.  He was started on D1/pure diet and honey thick liquids, but remains high risk for aspiration even on this restrictive diet. At this point, repeat EGD would not be helpful as he is restricted to puree, and these foods are able to pass through the esophageal stricture.  Additionally, due to progressive decline this admission, palliative was consulted and plan is to proceed with hospice care.  Notably, due to altered mental status and history of ?cirrhosis on prior imaging, ammonia was ordered and was within normal limits. Otherwise, no symptoms of liver failure. Abdomen remains soft, no evidence of peripheral edema. No overt GI bleeding.   Plan: No role for repeat EGD at this time. Follow SLP recommendations for diet.  Notably, he remains high risk for aspiration and needs full supervision. Continue PPI BID.  Ok to resume Xarelto per hospitalist if appropriate as no plans for repeat procedures. GI will sign off.     LOS: 4 days    11/23/2020, 11:56 AM   Aliene Altes, PA-C [facility] Gastroenterology

## 2020-11-23 NOTE — Progress Notes (Signed)
SLP Cancellation Note  Patient Details Name: [name] MRN: [id number] DOB: 1932-06-16   Cancelled treatment:       Reason Eval/Treat Not Completed: Other (comment) Pt will be transitioning to [place] - awaiting Hospice bed. Defer recommendations for comfort feeds and further monitoring of diet and education to Hospice. ST will sign off at this time, thank you for allowing [name] to care for this patient,  Almetta Lovely. Roddie Mc, CCC-SLP Speech Language Pathologist    Wende Bushy 11/23/2020, 1:27 PM

## 2020-11-23 NOTE — TOC Progression Note (Addendum)
Transition of Care [facility]) - Progression Note    Patient Details  Name: [name] MRN: [id number] Date of Birth: 09-29-1932  Transition of Care [facility] CM/SW Contact  Boneta Lucks, RN Phone Number: 11/23/2020, 11:33 AM  Clinical Narrative:   Palliative consulted TOC to refer patient to [facility]. Wife is requesting [employer]. Referral made to [facility] TOC waiting for bed status. Possible GIP.  Addendum: Hospice accepted, wife will go to facility to sign consent. [facility] has a bed. Patient needs COVID test. MD wants to discharge patient in the morning. Samantha at [facility] updated.  Expected Discharge Plan: Hospice Medical Facility Barriers to Discharge: Other (must enter comment) (waiting on Hospice bed)  Expected Discharge Plan and Services Expected Discharge Plan: [place] arrangements for the past 2 months: [place] Date [facility] Agency Contacted: 11/23/20 Time [place]: 68 Representative spoke with at [place]: [place]   Readmission Risk Interventions Readmission Risk Prevention Plan 11/23/2020  Transportation Screening Complete  PCP or Specialist Appt within 3-5 Days Complete  HRI or [place] Complete  Social Work Consult for [place] Planning/Counseling Complete  Palliative Care Screening Complete  Medication Review [employer]) Complete  Some recent data might be hidden

## 2020-11-23 NOTE — Progress Notes (Signed)
Palliative: [name] is lying quietly in bed.  He does not open his eyes or respond to me in any meaningful way when I call his name and touch his arm.  I do not believe that he is able to make his basic needs known.  There is no family at bedside at this time.  Call to wife of 35 years, [name].  Mrs. Iyengar has spoken with hospitalist attending.  She tells me that she would prefer for [name] to be transition to residential hospice in [place], [place].  We talked about the benefits of around-the-clock specialized care, where she can be the wife, not the caregiver.  She is tearful, but states that she knew that this time was coming.  I shared that hospice services will reach out to her.  Conference with attending, bedside nursing staff, transition of care team related to patient condition, needs, goals of care, disposition to residential hospice for comfort and dignity at end-of-life, [place].  Plan: Comfort and dignity at end-of-life, residential hospice at [place]. Prognosis: 2 weeks or less expected based on poor by mouth intake, frailty, family's desire to focus on comfort and dignity, let nature take its course.       64 minutes  Quinn Axe, NP Palliative medicine team Team phone [phone]  Greater than 50% of this time was spent counseling and coordinating care related to the above assessment and plan.

## 2020-11-23 NOTE — Progress Notes (Addendum)
ANTICOAGULATION CONSULT NOTE -   Pharmacy Consult for Heparin >> Xarelto Indication: atrial fibrillation, history of PE  Allergies  Allergen Reactions   Lovenox [Enoxaparin Sodium] Swelling   Penicillins Hives    Has patient had a PCN reaction causing immediate rash, facial/tongue/throat swelling, SOB or lightheadedness with hypotension: Yes Has patient had a PCN reaction causing severe rash involving mucus membranes or skin necrosis: No Has patient had a PCN reaction that required hospitalization: Unknown Has patient had a PCN reaction occurring within the last 10 years: No If all of the above answers are "NO", then may proceed with Cephalosporin use.    Tape Other (See Comments)    SKIN WILL TEAR IF TAPED (MUST BE LIGHT & BRIEF OR USE COBAN WRAP, PLEASE!!)    Patient Measurements: Height: 5\' 11"  (180.3 cm) Weight: 57 kg (125 lb 10.6 oz) IBW/kg (Calculated) : 75.3  Vital Signs: Temp: 97.9 F (36.6 C) (06/23 0350) Temp Source: Oral (06/23 0350) BP: 150/78 (06/23 0350) Pulse Rate: 68 (06/23 0350)  Labs: Recent Labs    11/21/20 0702 11/21/20 0703 11/22/20 0432 11/22/20 1526 11/23/20 0447  HGB 11.2*  --  11.4*  --  10.7*  HCT 37.3*  --  37.5*  --  36.4*  PLT 141*  --  159  --  143*  APTT 63*  --  64* 79* 84*  HEPARINUNFRC  --  0.61 0.31  --  0.37  CREATININE 0.95  --  0.91  --  0.72     Estimated Creatinine Clearance: 53.4 mL/min (by C-G formula based on SCr of 0.72 mg/dL).   Medical History: Past Medical History:  Diagnosis Date   Anemia    ASD (atrial septal defect)    a. s/p repair 1983.   BPH (benign prostatic hyperplasia)    Cerebrovascular accident ([place]) 1990   Chronic bronchitis ([place])    a. h/o resp failure in setting of bronchitis vs PNA 05/2012.   Chronic diarrhea    COPD (chronic obstructive pulmonary disease) (HCC)    Coronary artery disease    a. Nonobstructive by cath 2009 and 09/06/14   Depression    Dysphagia, unspecified(787.20)    Erosive  gastritis    H/o GIB felt secondary to this   Esophagitis    Falls frequently    Gastric polyp    Gastric ulcer    GERD (gastroesophageal reflux disease)    Hearing impairment    Hiatal hernia    History of upper gastrointestinal hemorrhage    dr. Maurene Capes, GI   Hyperlipidemia    Hypertension    Internal hemorrhoids    LV dysfunction    a. EF 45-50% by echo 05/2012.   Permanent atrial fibrillation (HCC)    INR [facility] at [facility].   Vision problems      Assessment: 87 y/o M with esophageal obstruction after eating chicken, required intubation in the ED, on Xarelto PTA for afib, holding Xarelto and starting heparin while intubated. It has been greater that 24 hours since last Xarelto dose (6/18 at ~1700), ok to start heparin now. Anticipate using aPTT to dose for now given Xarelto influence on anti-Xa levels. CBC/renal function good. Noted Lovenox allergy on profile, patient has tolerated heparin drip in the past when he had a PE.   APTT 84- therapeutic HL 0.37- therapeutic  Goal of Therapy:  Heparin level 0.3-0.7 units/ml aPTT 66-102 seconds Monitor platelets by anticoagulation protocol: Yes   Plan:  Stop heparin infusion Start home dose of  Xarelto 20 mg daily Monitor for bleeding  Margot Ables, PharmD Clinical Pharmacist 11/23/2020 7:33 AM

## 2020-11-24 LAB — CBC
HCT: 40.6 % (ref 39.0–52.0)
Hemoglobin: 11.9 g/dL — ABNORMAL LOW (ref 13.0–17.0)
MCH: 26.4 pg (ref 26.0–34.0)
MCHC: 29.3 g/dL — ABNORMAL LOW (ref 30.0–36.0)
MCV: 90 fL (ref 80.0–100.0)
Platelets: 145 10*3/uL — ABNORMAL LOW (ref 150–400)
RBC: 4.51 MIL/uL (ref 4.22–5.81)
RDW: 16.4 % — ABNORMAL HIGH (ref 11.5–15.5)
WBC: 5.9 10*3/uL (ref 4.0–10.5)
nRBC: 0 % (ref 0.0–0.2)

## 2020-11-24 LAB — SARS CORONAVIRUS 2 (TAT 6-24 HRS): SARS Coronavirus 2: NEGATIVE

## 2020-11-24 LAB — TRIGLYCERIDES: Triglycerides: 92 mg/dL (ref <150)

## 2020-11-24 LAB — BASIC METABOLIC PANEL
Anion gap: 6 (ref 5–15)
BUN: 20 mg/dL (ref 8–23)
CO2: 28 mmol/L (ref 22–32)
Calcium: 8.5 mg/dL — ABNORMAL LOW (ref 8.9–10.3)
Chloride: 111 mmol/L (ref 98–111)
Creatinine, Ser: 0.64 mg/dL (ref 0.61–1.24)
GFR, Estimated: >60 mL/min (ref >60)
Glucose, Bld: 110 mg/dL — ABNORMAL HIGH (ref 70–99)
Potassium: 3.1 mmol/L — ABNORMAL LOW (ref 3.5–5.1)
Sodium: 145 mmol/L (ref 135–145)

## 2020-11-24 MED ORDER — POLYVINYL ALCOHOL 1.4 % OP SOLN: 1.0000 [drp] | Freq: Four times a day (QID) | OPHTHALMIC | Status: DC | PRN

## 2020-11-24 MED ORDER — GLYCOPYRROLATE 0.2 MG/ML IJ SOLN: 0.2000 mg | INTRAMUSCULAR | Status: DC | PRN

## 2020-11-24 MED ORDER — MORPHINE SULFATE (PF) 4 MG/ML IV SOLN
4.0000 mg | INTRAVENOUS | Status: DC | PRN
  Administered 2020-11-24 (×2): 4 mg via INTRAVENOUS
  Filled 2020-11-24 (×2): qty 1

## 2020-11-24 MED ORDER — POTASSIUM CHLORIDE 20 MEQ PO PACK: 40.0000 meq | PACK | Freq: Two times a day (BID) | ORAL | Status: DC

## 2020-11-24 MED ORDER — BIOTENE DRY MOUTH MT LIQD: 15.0000 mL | OROMUCOSAL | Status: DC | PRN

## 2020-11-24 MED ORDER — HALOPERIDOL LACTATE 2 MG/ML PO CONC: 0.5000 mg | ORAL | Status: DC | PRN

## 2020-11-24 MED ORDER — HALOPERIDOL LACTATE 5 MG/ML IJ SOLN: 0.5000 mg | INTRAMUSCULAR | Status: DC | PRN

## 2020-11-24 MED ORDER — HALOPERIDOL 0.5 MG PO TABS: 0.5000 mg | ORAL_TABLET | ORAL | Status: DC | PRN

## 2020-11-24 MED ORDER — GLYCOPYRROLATE 1 MG PO TABS: 1.0000 mg | ORAL_TABLET | ORAL | Status: DC | PRN

## 2020-11-24 NOTE — Progress Notes (Signed)
Report called to  Marshell Garfinkel, LPN of [facility] ready to receive patient.  Notified patient 's daughter Carlyle Lipa of discharge plan.

## 2020-11-24 NOTE — Discharge Summary (Signed)
Physician Discharge Summary [facility]    Patient: [name]                   Admit date: 11/19/2020   DOB: 11-19-1932             Discharge date:11/24/2020/7:39 AM XIP:[id number]                          PCP: [name], MD  Disposition: Hospice  Recommendations for Outpatient Follow-up:   Follow up: With hospice  Discharge Condition: Stable   Code Status:   Code Status: DNR  Diet recommendation: Regular healthy diet   Discharge Diagnoses:    Principal Problem:   Aspiration pneumonia ([place]) Active Problems:   Hyperlipidemia   Essential hypertension   GERD   Hiatal hernia   Hyperglycemia   Atrial fibrillation with rapid ventricular response ([place])   Acute respiratory failure ([place])   Esophageal stricture   COPD (chronic obstructive pulmonary disease) ([place])   Dementia ([place])   Encounter for hospice care discussion   Frequent falls   Acute on chronic respiratory failure with hypoxia and hypercapnia ([place])   Hypothermia   Protein-calorie malnutrition, severe   Aspiration into airway   Foreign body in esophagus   History of Present Illness/ Hospital Course [name] Summary:   [name] is an 87 y.o. male with medical history significant for Atria fib on Xarelto, HTN, COPD chronic respiratory failure, dementia and frequent falls who presents to the emergency department via EMS after choking on chicken at home.  He was admitted with acute on chronic hypoxemic and hypercapnic respiratory failure secondary to presumed aspiration pneumonia and had to urgently undergo EGD due to esophageal obstruction with pieces of chicken.  He was intubated for airway protection and has subsequently been extubated on 6/20. Palliative is having discussions with family regarding discharge to either home hospice or residential hospice. SLP planning for MBSS in am, 6/21.      Acute on chronic hypoxemic and hypercapnic respiratory failure-multifactorial -92% on room  air -Noted to be in the setting of esophageal obstruction along with aspiration pneumonia -Continue IV Rocephin and metronidazole -Appreciate PCCM involvement and patient has now been extubated on 6/20 -Extensive discussion with patient's wife, palliative care following, wife is agreed to pursue with hospice today 11/23/2020     Esophageal obstruction with foreign bodies -Status post EGD by GI on 6/19 with removal of pieces of chicken -Remains on PPI as prescribed -Noted to also have some grade a esophagitis -May consider esophageal dilation with repeat EGD in 4 weeks per GI Dysphagia: Status post speech evaluation, status post modified barium swallow evaluation, recommending Dysphagia 1/pured diet, honey thick liquid, meds crushed, high risk for aspiration   Hypokalemia/hypomagnesemia -Replete and re-evaluate in am   Hypothermia-resolved -TSH 1.68   Atrial fibrillation -Discontinue heparin drip, resuming Xarelto if can tolerate p.o. -Metoprolol IV every 8 hours for heart rate control... Anticipating discontinue -We will restart p.o. metoprolol if he can tolerate p.o.   Essential hypertension -Cardizem and Imdur was on hold, resuming if tolerating p.o. -Low-dose IV metoprolol as above... Discontinued    Dyslipidemia -Atorvastatin, stable   Frequent falls/dementia/failure to thrive -Palliative having discussions regarding hospice. -Family has agreed to hospice     Hypernatremia -dehydration -Sodium at 146, potassium 3.8 chloride 112 -Status post treatment with D5 half-normal saline,  Continue to have very poor p.o. intake, altered mental status  Code Status: DNR Family Communication: Spoke with spouse Ms. [name] on phone 11/23/2020 Agreed to pursue with hospice   Disposition Plan:   take PO, and Inpatient level of care appropriate due to severity of illness   Dispo: The patient is from: Home    Consultants:  GI PCCM Palliative care   Procedures:  EGD  6/19 Intubation 6/19  Nutritional status:  Nutrition Problem: Severe Malnutrition Etiology: chronic illness Signs/Symptoms: severe fat depletion, severe muscle depletion Interventions: Magic cup, Other (Comment), MVI (Mighty shake)  The patient's BMI is: Body mass index is 17.53 kg/m. I agree with the assessment and plan as outlined.      Discharge Instructions:   Discharge Instructions     Activity as tolerated - No restrictions   Complete by: As directed    Diet - low sodium heart healthy   Complete by: As directed    Discharge instructions   Complete by: As directed    F/up with Hospice   Increase activity slowly   Complete by: As directed         Medication List     STOP taking these medications    isosorbide mononitrate 30 MG 24 hr tablet Commonly known as: IMDUR   simvastatin 20 MG tablet Commonly known as: ZOCOR       TAKE these medications    buPROPion 150 MG 24 hr tablet Commonly known as: WELLBUTRIN XL TAKE 1 TABLET EVERY DAY   clotrimazole-betamethasone cream Commonly known as: LOTRISONE APPLY TO AFFECTED AREAS TWICE A DAY   diltiazem 180 MG 24 hr capsule Commonly known as: CARDIZEM CD TAKE 1 CAPSULE ONCE DAILY   dutasteride 0.5 MG capsule Commonly known as: AVODART TAKE (1) CAPSULE DAILY What changed: See the new instructions.   furosemide 20 MG tablet Commonly known as: LASIX Take 1 tablet (20 mg total) by mouth as needed. For edema   ipratropium 0.02 % nebulizer solution Commonly known as: ATROVENT Nebulize one vial every 6 hours as needed. What changed:  how much to take how to take this when to take this reasons to take this   levalbuterol 0.63 MG/3ML nebulizer solution Commonly known as: XOPENEX NEBULIZE 1 VIAL (WITH IPRATROPIUM) TWICE A DAY AS NEEDED. MAY USE EXTR A EVERY 4 HOURS IF NEEDED What changed: See the new instructions.   mirtazapine 15 MG tablet Commonly known as: REMERON TAKE 1 TABLET DAILY What changed:  when to take this   nitroGLYCERIN 0.4 MG SL tablet Commonly known as: NITROSTAT Place 1 tablet (0.4 mg total) under the tongue every 5 (five) minutes x 3 doses as needed for chest pain.   pantoprazole 40 MG tablet Commonly known as: PROTONIX Take 1 tablet (40 mg total) by mouth daily.   potassium chloride 10 MEQ tablet Commonly known as: Klor-Con 10 Take two tablets once daily on days taking Furosemide. What changed:  how much to take how to take this when to take this reasons to take this   saccharomyces boulardii 250 MG capsule Commonly known as: FLORASTOR Take 250 mg by mouth 2 (two) times daily.   vitamin B-12 500 MCG tablet Commonly known as: CYANOCOBALAMIN Take 500 mcg by mouth daily.   Xarelto 20 MG Tabs tablet Generic drug: rivaroxaban TAKE 1 TABLET DAILY WITH SUPPER What changed: See the new instructions.        Contact information for after-discharge care     [facility] .   Service:  Inpatient Hospice Contact information: [address] [place] [address] [phone]                    Allergies  Allergen Reactions   Lovenox [Enoxaparin Sodium] Swelling   Penicillins Hives    Has patient had a PCN reaction causing immediate rash, facial/tongue/throat swelling, SOB or lightheadedness with hypotension: Yes Has patient had a PCN reaction causing severe rash involving mucus membranes or skin necrosis: No Has patient had a PCN reaction that required hospitalization: Unknown Has patient had a PCN reaction occurring within the last 10 years: No If all of the above answers are "NO", then may proceed with Cephalosporin use.    Tape Other (See Comments)    SKIN WILL TEAR IF TAPED (MUST BE LIGHT & BRIEF OR USE COBAN WRAP, PLEASE!!)     Procedures /Studies:   DG Abd 1 View  Result Date: 11/19/2020 CLINICAL DATA:  Check gastric catheter placement EXAM: ABDOMEN - 1 VIEW COMPARISON:  None.  FINDINGS: Gastric catheter is noted within the stomach. Loop is noted within the hiatal hernia above the diaphragm. No obstructive changes are noted. IMPRESSION: Gastric catheter with tip in the distal stomach. Electronically Signed   By: Inez Catalina M.D.   On: 11/19/2020 23:39   CT HEAD WO CONTRAST  Result Date: 11/20/2020 CLINICAL DATA:  Acute neurologic deficit EXAM: CT HEAD WITHOUT CONTRAST TECHNIQUE: Contiguous axial images were obtained from the base of the skull through the vertex without intravenous contrast. COMPARISON:  None. FINDINGS: Brain: There is no mass, hemorrhage or extra-axial collection. There is generalized atrophy without lobar predilection. Hypodensity of the white matter is most commonly associated with chronic microvascular disease. Vascular: Atherosclerotic calcification of the internal carotid arteries at the skull base. No abnormal hyperdensity of the major intracranial arteries or dural venous sinuses. Skull: The visualized skull base, calvarium and extracranial soft tissues are normal. Sinuses/Orbits: No fluid levels or advanced mucosal thickening of the visualized paranasal sinuses. No mastoid or middle ear effusion. The orbits are normal. IMPRESSION: Generalized atrophy and chronic microvascular ischemia without acute intracranial abnormality. Electronically Signed   By: Ulyses Jarred M.D.   On: 11/20/2020 02:27   CT Chest Wo Contrast  Result Date: 11/19/2020 CLINICAL DATA:  Provided history of pneumothorax. Patient choked on a piece of chicken. EXAM: CT CHEST WITHOUT CONTRAST TECHNIQUE: Multidetector CT imaging of the chest was performed following the standard protocol without IV contrast. COMPARISON:  Radiograph earlier today.  Chest CT 10 17 FINDINGS: Cardiovascular: Aortic atherosclerosis and tortuosity. Post median sternotomy and CABG. Prominent dilatation of the central pulmonary arteries with main pulmonary artery measuring 4 cm, right pulmonary artery measuring 4.2 cm  and left pulmonary artery measuring 3.5 cm. Dense coronary artery calcification of native coronary arteries. Multi chamber cardiomegaly. No pericardial effusion. Mediastinum/Nodes: Large hiatal hernia with the entire stomach being intrathoracic. There is a granular axial a rotation of the stomach. The esophagus is dilated to the level of the thoracic inlet. Upper esophagus is patulous. No evidence of pneumomediastinum. Endotracheal tube tip above the carina. No enlarged mediastinal lymph nodes. Limited hilar assessment. No thyroid nodule. Lungs/Pleura: No pneumothorax. Chronic volume loss in the left lower lobe with occlusion of the left lower lobe bronchus, likely compressive. Chronic left basilar atelectasis. Minimal scarring in the anterior and lateral right upper lobe, similar to prior exam. Emphysema. Minimal right lower lobe bronchiectasis. No acute confluent airspace disease. No pulmonary mass. No pleural fluid. Upper Abdomen: Large  hiatal hernia with the entire stomach being intrathoracic. Cholecystectomy. No acute upper abdominal findings. Musculoskeletal: Exaggerated thoracic kyphosis with bony under mineralization. Chronic L2 compression fracture is unchanged from 11/18/2019 abdominal CT. Median sternotomy. No acute osseous abnormalities are seen. IMPRESSION: 1. Large hiatal hernia with the entire stomach being intrathoracic. The esophagus is fluid-filled and dilated to the level of the thoracic inlet. No pneumomediastinum. 2. No pneumothorax. 3. Cardiomegaly post CABG. Prominent dilatation of the central pulmonary arteries consistent with pulmonary arterial hypertension. 4. Chronic volume loss in the left lower lobe with occlusion of the left lower lobe bronchus, likely compressive. Chronic left basilar atelectasis. 5. Emphysema. Aortic Atherosclerosis (ICD10-I70.0) and Emphysema (ICD10-J43.9). Electronically Signed   By: Keith Rake M.D.   On: 11/19/2020 17:06   DG CHEST PORT 1 VIEW  Result  Date: 11/20/2020 CLINICAL DATA:  Aspiration EXAM: PORTABLE CHEST 1 VIEW COMPARISON:  11/19/2020 and prior studies FINDINGS: The cardiomediastinal silhouette is unchanged. A large hiatal hernia is present. An endotracheal tube with tip 4.5 cm above the carina and NG tube extending through the intrathoracic hiatal hernia and into the UPPER abdomen noted. LEFT LOWER lung atelectasis is again noted. No pneumothorax identified. IMPRESSION: No significant change. Large hiatal hernia and LEFT LOWER lung atelectasis. Electronically Signed   By: Margarette Canada M.D.   On: 11/20/2020 13:44   DG Chest Port 1 View  Result Date: 11/19/2020 CLINICAL DATA:  OG tube placement EXAM: PORTABLE CHEST 1 VIEW COMPARISON:  11/19/2020 FINDINGS: Endotracheal tube terminates at the level of the clavicular heads. Interval placement of enteric tube with side port terminating at the level of the carina and distal tip terminating within the lower chest. Large hiatal hernia. Emphysematous changes of the lung fields. Left basilar opacities, better seen on recent CT. No pneumothorax. IMPRESSION: Interval placement of enteric tube. The distal tip is likely positioned within patient's large hiatal hernia. The side port is likely within the distal esophagus. Recommend advancement. Electronically Signed   By: Davina Poke D.O.   On: 11/19/2020 18:56   DG Chest Portable 1 View  Result Date: 11/19/2020 CLINICAL DATA:  Choking post intubation. EXAM: PORTABLE CHEST 1 VIEW COMPARISON:  December 31, 2017 FINDINGS: Endotracheal tube terminates 4 cm above the carina, appropriately position. Enlarged cardiac silhouette. No evidence of pneumothorax. Possible left pleural effusion. Osseous structures are without acute abnormality. Soft tissues are grossly normal. IMPRESSION: 1. Endotracheal tube terminates 4 cm above the carina, appropriately positioned. 2. Enlarged cardiac silhouette. 3. Possible left pleural effusion. Electronically Signed   By: Fidela Salisbury M.D.   On: 11/19/2020 16:13   DG Swallowing Func-Speech Pathology  Result Date: 11/22/2020 Formatting of this result is different from the original. Objective Swallowing Evaluation: Type of Study: MBS-Modified Barium Swallow Study  Patient Details Name: [name] MRN: [id number] Date of Birth: 09/23/1932 Today's Date: 11/22/2020 Time: SLP Start Time (ACUTE ONLY): 11 -SLP Stop Time (ACUTE ONLY): 1257 SLP Time Calculation (min) (ACUTE ONLY): 27 min Past Medical History: Past Medical History: Diagnosis Date  Anemia   ASD (atrial septal defect)   a. s/p repair 1983.  BPH (benign prostatic hyperplasia)   Cerebrovascular accident ([place]) 1990  Chronic bronchitis ([place])   a. h/o resp failure in setting of bronchitis vs PNA 05/2012.  Chronic diarrhea   COPD (chronic obstructive pulmonary disease) ([facility])   Coronary artery disease   a. Nonobstructive by cath 2009 and 09/06/14  Depression   Dysphagia, unspecified(787.20)   Erosive gastritis   H/o  GIB felt secondary to this  Esophagitis   Falls frequently   Gastric polyp   Gastric ulcer   GERD (gastroesophageal reflux disease)   Hearing impairment   Hiatal hernia   History of upper gastrointestinal hemorrhage   dr. Maurene Capes, GI  Hyperlipidemia   Hypertension   Internal hemorrhoids   LV dysfunction   a. EF 45-50% by echo 05/2012.  Permanent atrial fibrillation ([facility])   INR followed at [facility].  Vision problems  Past Surgical History: Past Surgical History: Procedure Laterality Date  ASD REPAIR  1983  CARDIOVERSION    possibly 3 times, dr. brody/cooper  CHOLECYSTECTOMY  1990s  ENTEROSCOPY  05/13/2012  Procedure: ENTEROSCOPY;  Surgeon: Inda Castle, MD;  Location: [facility] ENDOSCOPY;  Service: Endoscopy;  Laterality: N/A;  INGUINAL HERNIA REPAIR  1963  LEFT HEART CATHETERIZATION WITH CORONARY ANGIOGRAM N/A 09/06/2014  Procedure: LEFT HEART CATHETERIZATION WITH CORONARY ANGIOGRAM;  Surgeon: Jettie Booze, MD;  Location: [facility] CATH LAB;  Service: Cardiovascular;  Laterality: N/A;   partial small bowel resection  2005  ischemic? HPI: [name] is an 87 y.o. male with medical history significant for Atria fib on Xarelto, HTN, COPD chronic respiratory failure, dementia and frequent falls who presents to the emergency department via EMS after choking on chicken at home.  He was admitted with acute on chronic hypoxemic and hypercapnic respiratory failure secondary to presumed aspiration pneumonia and had to urgently undergo EGD due to esophageal obstruction with pieces of chicken.  He was intubated for airway protection and has subsequently been extubated on 6/20.  No data recorded Assessment / Plan / Recommendation CHL IP CLINICAL IMPRESSIONS 11/22/2020 Clinical Impression Pt presents with moderate/severe oropharyngeal sensorimotor dysphagia characterized by silent aspiration of thin liquids and NTL; swallowing is characterized by gross pharyngeal weakness and pharyngeal residue of all consistencies and textures. Please note visualization was limited by Pt positioning despite max cues and tactile support to reposition throughout exam. NTL did not appear to be initially aspirated, however, mild/mod residue in the valleculae, lateral channels and pyriforms after the swallow was eventually aspirated and visualized on the posterior wall of the trachea following the swallow. Pt was unable to produce a cued cough to clear aspirates. Mild valleculae and pyriform residue was noted with HTL and puree textures, however no penetration or aspiraiton was visualized with these textures. Pt further demonstrates reduced oral control (was unable to masticate regular trial - was eventually expectorated), reduced epiglottic deflection, decreased pharyngeal peristalsis, decreased laryngeal vestibule closure and decreased BOT retraction. Recommend continue with D1/puree diet and downgrade Pt's diet to HONEY thick liquids; Recommend meds to be crushed in puree. Pt is still at high risk for aspiration even on this  least restrictive diet and further note he is also at risk for malnutrition and dehydration. ST will continue to follow for education and goals of care, thank you SLP Visit Diagnosis Dysphagia, unspecified (R13.10) Attention and concentration deficit following -- Frontal lobe and executive function deficit following -- Impact on safety and function Moderate aspiration risk;Severe aspiration risk;Risk for inadequate nutrition/hydration   CHL IP TREATMENT RECOMMENDATION 11/22/2020 Treatment Recommendations Therapy as outlined in treatment plan below   Prognosis 11/22/2020 Prognosis for Safe Diet Advancement Fair Barriers to Reach Goals -- Barriers/Prognosis Comment -- CHL IP DIET RECOMMENDATION 11/22/2020 SLP Diet Recommendations Dysphagia 1 (Puree) solids;Honey thick liquids Liquid Administration via Straw;Cup Medication Administration Crushed with puree Compensations Minimize environmental distractions;Slow rate;Small sips/bites;Clear throat intermittently;Follow solids with liquid Postural Changes Remain semi-upright after after  feeds/meals (Comment)   CHL IP OTHER RECOMMENDATIONS 11/22/2020 Recommended Consults -- Oral Care Recommendations Oral care BID Other Recommendations Order thickener from pharmacy;Clarify dietary restrictions;Remove water pitcher;Prohibited food (jello, ice cream, thin soups)   CHL IP FOLLOW UP RECOMMENDATIONS 11/22/2020 Follow up Recommendations Skilled Nursing facility;24 hour supervision/assistance   CHL IP FREQUENCY AND DURATION 11/22/2020 Speech Therapy Frequency (ACUTE ONLY) min 2x/week Treatment Duration 1 week      CHL IP ORAL PHASE 11/22/2020 Oral Phase Impaired Oral - Pudding Teaspoon -- Oral - Pudding Cup -- Oral - Honey Teaspoon NT Oral - Honey Cup Piecemeal swallowing;Delayed oral transit;Lingual pumping;Holding of bolus Oral - Nectar Teaspoon NT Oral - Nectar Cup Piecemeal swallowing;Delayed oral transit;Lingual pumping;Holding of bolus Oral - Nectar Straw NT Oral - Thin Teaspoon  Piecemeal swallowing;Delayed oral transit;Lingual pumping;Holding of bolus Oral - Thin Cup Piecemeal swallowing;Delayed oral transit;Lingual pumping;Holding of bolus Oral - Thin Straw Piecemeal swallowing;Delayed oral transit;Lingual pumping;Holding of bolus Oral - Puree Decreased bolus cohesion;Lingual pumping;Holding of bolus Oral - Mech Soft NT Oral - Regular Decreased bolus cohesion;Lingual pumping;Holding of bolus Oral - Multi-Consistency NT Oral - Pill -- Oral Phase - Comment --  CHL IP PHARYNGEAL PHASE 11/22/2020 Pharyngeal Phase Impaired Pharyngeal- Pudding Teaspoon -- Pharyngeal -- Pharyngeal- Pudding Cup -- Pharyngeal -- Pharyngeal- Honey Teaspoon NT Pharyngeal -- Pharyngeal- Honey Cup Reduced pharyngeal peristalsis;Reduced epiglottic inversion;Reduced laryngeal elevation;Pharyngeal residue - valleculae;Pharyngeal residue - pyriform Pharyngeal -- Pharyngeal- Nectar Teaspoon NT Pharyngeal -- Pharyngeal- Nectar Cup Delayed swallow initiation-pyriform sinuses;Reduced pharyngeal peristalsis;Reduced epiglottic inversion;Reduced anterior laryngeal mobility;Reduced laryngeal elevation;Penetration/Apiration after swallow;Trace aspiration;Pharyngeal residue - valleculae;Pharyngeal residue - pyriform;Lateral channel residue Pharyngeal Material enters airway, passes BELOW cords without attempt by patient to eject out (silent aspiration) Pharyngeal- Nectar Straw NT Pharyngeal -- Pharyngeal- Thin Teaspoon Delayed swallow initiation-pyriform sinuses;Reduced pharyngeal peristalsis;Reduced epiglottic inversion;Reduced anterior laryngeal mobility;Reduced tongue base retraction;Pharyngeal residue - valleculae;Pharyngeal residue - pyriform Pharyngeal Material does not enter airway Pharyngeal- Thin Cup Delayed swallow initiation-pyriform sinuses;Reduced epiglottic inversion;Reduced anterior laryngeal mobility;Reduced laryngeal elevation;Penetration/Aspiration during swallow;Penetration/Apiration after swallow;Trace  aspiration;Pharyngeal residue - valleculae;Pharyngeal residue - pyriform Pharyngeal Material enters airway, passes BELOW cords without attempt by patient to eject out (silent aspiration) Pharyngeal- Thin Straw Delayed swallow initiation-pyriform sinuses;Reduced pharyngeal peristalsis;Reduced epiglottic inversion;Reduced anterior laryngeal mobility;Reduced laryngeal elevation;Reduced airway/laryngeal closure;Moderate aspiration;Pharyngeal residue - valleculae;Pharyngeal residue - pyriform;Lateral channel residue;Penetration/Aspiration during swallow;Penetration/Apiration after swallow Pharyngeal Material enters airway, passes BELOW cords without attempt by patient to eject out (silent aspiration) Pharyngeal- Puree Delayed swallow initiation-pyriform sinuses;Pharyngeal residue - valleculae;Pharyngeal residue - pyriform;Reduced anterior laryngeal mobility;Reduced laryngeal elevation;Reduced epiglottic inversion Pharyngeal Material does not enter airway Pharyngeal- Mechanical Soft NT Pharyngeal -- Pharyngeal- Regular Pharyngeal residue - valleculae;Pharyngeal residue - pyriform;Reduced airway/laryngeal closure;Reduced laryngeal elevation;Reduced tongue base retraction;Reduced anterior laryngeal mobility;Reduced epiglottic inversion Pharyngeal Material does not enter airway Pharyngeal- Multi-consistency NT Pharyngeal -- Pharyngeal- Pill NT Pharyngeal -- Pharyngeal Comment --  CHL IP CERVICAL ESOPHAGEAL PHASE 11/22/2020 Cervical Esophageal Phase WFL Pudding Teaspoon -- Pudding Cup -- Honey Teaspoon -- Honey Cup -- Nectar Teaspoon -- Nectar Cup -- Nectar Straw -- Thin Teaspoon -- Thin Cup -- Thin Straw -- Puree -- Mechanical Soft -- Regular -- Multi-consistency -- Pill -- Cervical Esophageal Comment -- Amelia H. Roddie Mc, CCC-SLP Speech Language Pathologist Wende Bushy 11/22/2020, 2:52 PM              Bedside bronchoscopy  Result Date: 11/19/2020 Darrell Bo, MD     11/19/2020  9:15 PM Bedside bronchoscopy  Date/Time: 11/19/2020 5:49 PM Performed by: Darrell Bo, MD  Authorized by: Darrell Bo, MD Consent: Verbal consent obtained. Risks and benefits: risks, benefits and alternatives were discussed Consent given by: spouse Imaging studies: imaging studies available Patient identity confirmed: hospital-assigned identification number Time out: Immediately prior to procedure a "time out" was called to verify the correct patient, procedure, equipment, support staff and site/side marked as required. Local anesthesia used: no Anesthesia: Local anesthesia used: no Sedation: Patient sedated: yes Sedatives: propofol Patient tolerance: patient tolerated the procedure well with no immediate complications Comments: Patient was previously sedated for ventilator management.  Bronchoscopy done to 4 levels on right, 5 levels on the left, no foreign body obstruction, fluid collection or significant mucus accumulation.    Subjective:   Patient was seen and examined 11/24/2020, 7:39 AM Patient stable today. No acute distress.  No issues overnight Stable for discharge.  Discharge Exam:    Vitals:   11/23/20 1132 11/23/20 1410 11/24/20 0200 11/24/20 0526  BP: (!) 164/110 (!) 160/88  127/75  Pulse: 90 85  100  Resp: 18 18 (!) 34 (!) 34  Temp: 97.8 F (36.6 C) 98.8 F (37.1 C)  97.6 F (36.4 C)  TempSrc: Axillary Axillary    SpO2: 92% 93%    Weight:      Height:        General: Pt lying comfortably in bed & very confused, Cardiovascular: S1 & S2 heard, RRR, S1/S2 +. No murmurs, rubs, gallops or clicks. No JVD or pedal edema. Respiratory: Clear to auscultation without wheezing, rhonchi or crackles. No increased work of breathing. Abdominal:  Non-distended, non-tender & soft. No organomegaly or masses appreciated. Normal bowel sounds heard. CNS: Remain confused, lethargic, encephalopathic moving all 4 extremities in bed, few words Extremities: no edema, no cyanosis      The results of significant  diagnostics from this hospitalization (including imaging, microbiology, ancillary and laboratory) are listed below for reference.      Microbiology:   Recent Results (from the past 240 hour(s))  Resp Panel by RT-PCR (Flu A&B, Covid) Nasopharyngeal Swab     Status: None   Collection Time: 11/19/20  3:54 PM   Specimen: Nasopharyngeal Swab; Nasopharyngeal(NP) swabs in vial transport medium  Result Value Ref Range Status   SARS Coronavirus 2 by RT PCR NEGATIVE NEGATIVE Final    Comment: (NOTE) SARS-CoV-2 target nucleic acids are NOT DETECTED.  The SARS-CoV-2 RNA is generally detectable in upper respiratory specimens during the acute phase of infection. The lowest concentration of SARS-CoV-2 viral copies this assay can detect is 138 copies/mL. A negative result does not preclude SARS-Cov-2 infection and should not be used as the sole basis for treatment or other patient management decisions. A negative result may occur with  improper specimen collection/handling, submission of specimen other than nasopharyngeal swab, presence of viral mutation(s) within the areas targeted by this assay, and inadequate number of viral copies(<138 copies/mL). A negative result must be combined with clinical observations, patient history, and epidemiological information. The expected result is Negative.  Fact Sheet for Patients:  EntrepreneurPulse.com.au  Fact Sheet for Healthcare Providers:  IncredibleEmployment.be  This test is no t yet approved or cleared by the [place] FDA and  has been authorized for detection and/or diagnosis of SARS-CoV-2 by FDA under an Emergency Use Authorization (EUA). This EUA will remain  in effect (meaning this test can be used) for the duration of the COVID-19 declaration under Section 564(b)(1) of the Act, 21 U.S.C.section 360bbb-3(b)(1), unless the authorization is terminated  or revoked sooner.  Influenza A by PCR  NEGATIVE NEGATIVE Final   Influenza B by PCR NEGATIVE NEGATIVE Final    Comment: (NOTE) The Xpert Xpress SARS-CoV-2/FLU/RSV plus assay is intended as an aid in the diagnosis of influenza from Nasopharyngeal swab specimens and should not be used as a sole basis for treatment. Nasal washings and aspirates are unacceptable for Xpert Xpress SARS-CoV-2/FLU/RSV testing.  Fact Sheet for Patients: EntrepreneurPulse.com.au  Fact Sheet for Healthcare Providers: IncredibleEmployment.be  This test is not yet approved or cleared by the [place] FDA and has been authorized for detection and/or diagnosis of SARS-CoV-2 by FDA under an Emergency Use Authorization (EUA). This EUA will remain in effect (meaning this test can be used) for the duration of the COVID-19 declaration under Section 564(b)(1) of the Act, 21 U.S.C. section 360bbb-3(b)(1), unless the authorization is terminated or revoked.  Performed at [facility], [address], [place], [place] [address]   MRSA Next Gen by PCR, Nasal     Status: None   Collection Time: 11/19/20  9:40 PM   Specimen: Nasal Mucosa; Nasal Swab  Result Value Ref Range Status   MRSA by PCR Next Gen NOT DETECTED NOT DETECTED Final    Comment: (NOTE) The GeneXpert MRSA Assay (FDA approved for NASAL specimens only), is one component of a comprehensive MRSA colonization surveillance program. It is not intended to diagnose MRSA infection nor to guide or monitor treatment for MRSA infections. Test performance is not FDA approved in patients less than 104 years old. Performed at [facility], [address], [place], [place] [address]   Culture, Urine     Status: None   Collection Time: 11/20/20 11:15 AM   Specimen: Urine, Catheterized  Result Value Ref Range Status   Specimen Description   Final    URINE, CATHETERIZED Performed at [facility], [address], [place], [place] [address]    Special Requests   Final     NONE Performed at [facility], [address], [place], [place] [address]    Culture   Final    NO GROWTH Performed at [facility], [place] [address], [place],  [address]    Report Status 11/22/2020 FINAL  Final  SARS CORONAVIRUS 2 (TAT 6-24 HRS) Nasopharyngeal Nasopharyngeal Swab     Status: None   Collection Time: 11/23/20  4:00 PM   Specimen: Nasopharyngeal Swab  Result Value Ref Range Status   SARS Coronavirus 2 NEGATIVE NEGATIVE Final    Comment: (NOTE) SARS-CoV-2 target nucleic acids are NOT DETECTED.  The SARS-CoV-2 RNA is generally detectable in upper and lower respiratory specimens during the acute phase of infection. Negative results do not preclude SARS-CoV-2 infection, do not rule out co-infections with other pathogens, and should not be used as the sole basis for treatment or other patient management decisions. Negative results must be combined with clinical observations, patient history, and epidemiological information. The expected result is Negative.  Fact Sheet for Patients: SugarRoll.be  Fact Sheet for Healthcare Providers: https://www.woods-mathews.com/  This test is not yet approved or cleared by the [place] FDA and  has been authorized for detection and/or diagnosis of SARS-CoV-2 by FDA under an Emergency Use Authorization (EUA). This EUA will remain  in effect (meaning this test can be used) for the duration of the COVID-19 declaration under Se ction 564(b)(1) of the Act, 21 U.S.C. section 360bbb-3(b)(1), unless the authorization is terminated or revoked sooner.  Performed at [facility], [place] [address], [place], [place]  [address]      Labs:   CBC: Recent Labs  Lab 11/19/20 1546 11/20/20 0440 11/21/20 0702 11/22/20 0432 11/23/20 0447 11/24/20 0610  WBC 8.3 9.6 10.6* 9.1 6.7 5.9  NEUTROABS 5.3  --   --   --   --   --   HGB 13.2 12.1* 11.2* 11.4* 10.7* 11.9*  HCT 44.0 40.4  37.3* 37.5* 36.4* 40.6  MCV 88.4 87.6 87.4 87.6 89.0 90.0  PLT 214 144* 141* 159 143* 559*   Basic Metabolic Panel: Recent Labs  Lab 11/20/20 0440 11/20/20 1812 11/21/20 0702 11/21/20 1823 11/22/20 0432 11/23/20 0447 11/24/20 0610  NA 142  --  143  --  146* 143 145  K 3.5  --  2.9*  --  3.8 3.3* 3.1*  CL 105  --  109  --  112* 109 111  CO2 28  --  25  --  26 26 28   GLUCOSE 104*  --  84  --  96 158* 110*  BUN 31*  --  34*  --  37* 28* 20  CREATININE 1.04  --  0.95  --  0.91 0.72 0.64  CALCIUM 9.0  --  8.6*  --  8.8* 8.1* 8.5*  MG 1.7 1.6* 1.6* 2.4 2.1  --   --   PHOS 1.7* 2.8 2.5 2.2*  --   --   --    Liver Function Tests: Recent Labs  Lab 11/19/20 1546 11/20/20 0440  AST 23 32  ALT 14 17  ALKPHOS 74 59  BILITOT 0.3 0.6  PROT 7.6 6.2*  ALBUMIN 4.4 3.5   BNP (last 3 results) No results for input(s): BNP in the last 8760 hours. Cardiac Enzymes: No results for input(s): CKTOTAL, CKMB, CKMBINDEX, TROPONINI in the last 168 hours. CBG: Recent Labs  Lab 11/23/20 0353 11/23/20 0744 11/23/20 1109 11/23/20 1619 11/23/20 2040  GLUCAP 156* 124* 118* 96 119*   Hgb A1c No results for input(s): HGBA1C in the last 72 hours. Lipid Profile Recent Labs    11/24/20 0610  TRIG 92   Thyroid function studies No results for input(s): TSH, T4TOTAL, T3FREE, THYROIDAB in the last 72 hours.  Invalid input(s): FREET3 Anemia work up No results for input(s): VITAMINB12, FOLATE, FERRITIN, TIBC, IRON, RETICCTPCT in the last 72 hours. Urinalysis    Component Value Date/Time   COLORURINE AMBER (A) 11/20/2020 1115   APPEARANCEUR CLOUDY (A) 11/20/2020 1115   LABSPEC 1.032 (H) 11/20/2020 1115   PHURINE 5.0 11/20/2020 1115   GLUCOSEU NEGATIVE 11/20/2020 1115   HGBUR MODERATE (A) 11/20/2020 1115   BILIRUBINUR NEGATIVE 11/20/2020 1115   BILIRUBINUR 1+ 05/11/2015 1420   KETONESUR 5 (A) 11/20/2020 1115   PROTEINUR 100 (A) 11/20/2020 1115   UROBILINOGEN 0.2 05/11/2015 1420    UROBILINOGEN 0.2 11/16/2012 1237   NITRITE NEGATIVE 11/20/2020 1115   LEUKOCYTESUR MODERATE (A) 11/20/2020 1115         Time coordinating discharge: Over 45 minutes  SIGNED: Deatra James, MD, FACP, [facility]. [facility],  Please use [email or web address] to Page If 7PM-7AM, please contact night-coverage [email or web address] Ota [facility] 11/24/2020, 7:39 AM

## 2020-11-24 NOTE — Progress Notes (Signed)
Palliative: Mr. Epp is now comfort care.  Family is requesting comfort and dignity at end-of-life, residential hospice.  He continues to appear acutely/chronically ill and very frail.  There is no family at bedside at this time.  End-of-life order set implemented.  Conference with attending, bedside nursing staff, transition of care team related to patient condition, needs, goals of care, disposition.  Plan:   Comfort and dignity at end-of-life, residential hospice at [place].  End-of-life order set implemented, goldenrod form completed and placed on chart.  25 minutes  Quinn Axe, NP Palliative medicine team team phone [phone] Greater than 50% of this time was spent counseling and coordinating care related to the above assessment and plan.

## 2020-11-24 NOTE — TOC Transition Note (Signed)
Transition of Care [facility]) - CM/SW Discharge Note   Patient Details  Name: [name] MRN: [id number] Date of Birth: 1932/06/28  [facility] CM/SW Contact:  [place] Bence, LCSW Phone Number: 11/24/2020, 10:50 AM   Clinical Narrative:    CSW notified of patient's readiness for discharge. Samantha with [facility] hospice agreeable to take patient. CSW completed med necessity and Called EMS. Nurse agreeable to call report. TOC signing off.    Final next level of care: [place] Barriers to Discharge: Barriers Resolved   Patient Goals and CMS Choice Patient states their goals for this hospitalization and ongoing recovery are:: Hospice CMS Medicare.gov Compare Post Acute Care list provided to:: Patient Choice offered to / list presented to : Patient  Discharge Placement                Patient to be transferred to facility by: [facility] Name of family member notified: [place], [name] (Spouse)   [phone] Patient and family notified of of transfer: 11/24/20  Discharge Plan and Services                            [place] Date [place]: 11/23/20 Time [place]: 77 Representative spoke with at [place]: Abanda ([place]) Interventions     Readmission Risk Interventions Readmission Risk Prevention Plan 11/23/2020  Transportation Screening Complete  PCP or Specialist Appt within 3-5 Days Complete  HRI or Home Care Consult Complete  Social Work Consult for [place] Planning/Counseling Complete  Palliative Care Screening Complete  Medication Review Press photographer) Complete  Some recent data might be hidden

## 2020-11-25 LAB — GLUCOSE, CAPILLARY: Glucose-Capillary: 128 mg/dL — ABNORMAL HIGH (ref 70–99)

## 2020-12-01 DEATH — deceased
